# Patient Record
Sex: Female | Born: 1940 | Race: Asian | Hispanic: No | Smoking: Never smoker
Health system: Southern US, Community
[De-identification: ages and names within clinical notes are randomized; demographics above are authoritative.]

## PROBLEM LIST (undated history)

## (undated) DIAGNOSIS — E781 Pure hyperglyceridemia: Secondary | ICD-10-CM

## (undated) DIAGNOSIS — R609 Edema, unspecified: Secondary | ICD-10-CM

## (undated) DIAGNOSIS — R519 Headache, unspecified: Secondary | ICD-10-CM

## (undated) DIAGNOSIS — K8020 Calculus of gallbladder without cholecystitis without obstruction: Secondary | ICD-10-CM

## (undated) DIAGNOSIS — E118 Type 2 diabetes mellitus with unspecified complications: Secondary | ICD-10-CM

## (undated) DIAGNOSIS — I10 Essential (primary) hypertension: Secondary | ICD-10-CM

## (undated) DIAGNOSIS — Z923 Personal history of irradiation: Secondary | ICD-10-CM

## (undated) DIAGNOSIS — M1990 Unspecified osteoarthritis, unspecified site: Secondary | ICD-10-CM

## (undated) DIAGNOSIS — M545 Low back pain, unspecified: Secondary | ICD-10-CM

## (undated) DIAGNOSIS — C3491 Malignant neoplasm of unspecified part of right bronchus or lung: Secondary | ICD-10-CM

## (undated) DIAGNOSIS — I4891 Unspecified atrial fibrillation: Secondary | ICD-10-CM

## (undated) DIAGNOSIS — J40 Bronchitis, not specified as acute or chronic: Secondary | ICD-10-CM

## (undated) DIAGNOSIS — R51 Headache: Secondary | ICD-10-CM

## (undated) DIAGNOSIS — G893 Neoplasm related pain (acute) (chronic): Secondary | ICD-10-CM

## (undated) DIAGNOSIS — C801 Malignant (primary) neoplasm, unspecified: Secondary | ICD-10-CM

## (undated) DIAGNOSIS — R918 Other nonspecific abnormal finding of lung field: Secondary | ICD-10-CM

## (undated) DIAGNOSIS — I4892 Unspecified atrial flutter: Secondary | ICD-10-CM

## (undated) DIAGNOSIS — I2510 Atherosclerotic heart disease of native coronary artery without angina pectoris: Secondary | ICD-10-CM

## (undated) HISTORY — DX: Atherosclerotic heart disease of native coronary artery without angina pectoris: I25.10

## (undated) HISTORY — DX: Edema, unspecified: R60.9

## (undated) HISTORY — DX: Unspecified atrial flutter: I48.92

## (undated) HISTORY — DX: Malignant (primary) neoplasm, unspecified: C80.1

## (undated) HISTORY — DX: Unspecified osteoarthritis, unspecified site: M19.90

## (undated) HISTORY — DX: Low back pain: M54.5

## (undated) HISTORY — DX: Pure hyperglyceridemia: E78.1

## (undated) HISTORY — DX: Malignant neoplasm of unspecified part of right bronchus or lung: C34.91

## (undated) HISTORY — PX: INCONTINENCE SURGERY: SHX676

## (undated) HISTORY — PX: TUBAL LIGATION: SHX77

## (undated) HISTORY — DX: Essential (primary) hypertension: I10

## (undated) HISTORY — DX: Neoplasm related pain (acute) (chronic): G89.3

## (undated) HISTORY — PX: KNEE ARTHROSCOPY: SUR90

## (undated) HISTORY — DX: Type 2 diabetes mellitus with unspecified complications: E11.8

## (undated) HISTORY — DX: Low back pain, unspecified: M54.50

## (undated) HISTORY — PX: APPENDECTOMY: SHX54

## (undated) NOTE — *Deleted (*Deleted)
Physician Discharge Summary  Patient ID: Nicole [name] MRN: [id number] DOB/AGE: 05-Jun-1940 26 y.o.  Admit date: 12/01/2019 Discharge date: 12/07/2019   DISCHARGE PLAN BY DIAGNOSIS    Ataumatic subarachnoid hemorrhage- -Likely in setting of anticoagulation, s/p kcentra -CT angiogram of head showed no aneurysm Apnea Symptomatic bradycardia Afib;  w RVR  Acute DVT of LLE Intermittent fluid and electrolyte imbalance DMT2 with uncontrolled hyperglycemia Mild Leukocytosis Sacral pressure ulcer -Seen by [facility] 11/9->measured 4cmx7cm w/ small 0.2 cm round non-intact area near rectum.  L mediastinal supraclavicular lymphadenopathy  R apical nodule vs consolidation, 7mm R thyroid nodule  Hx adenocarcinoma of lung      DISCHARGE SUMMARY  Nicole [name] is a 4 y.o. female with medical history significant of recently diagnosed DVT on Eliquis, chronic lumbar vertebral compression fracture status post recent kyphoplasty, chronic A. fib, hypertension, remote lung cancer, IDDM, HLD, presented with altered mental status.  Patient remained confused at bedside, most history per the patient's son at bedside.  Patient was diagnosed with DVT about 2 weeks ago, and son reported that patient has not been compliant with Eliquis supposedly for her A. fib, and about 2 weeks ago was found to have acute DVT.  Eliquis was restarted at nursing home and on Friday patient went for lumbar vertebral kyphoplasty.  After surgery, patient has had  difficulty with pain management and devloped nausea and vomiting. which is tramadol alternate with Vicodin. 11/3 patient was found to have a fever 102, and hypoxia on 4 L oxygen, nursing home somewhat suspect patient has UTI and send urine sample.  When EMS arrived it was found patient was in rapid A. fib with heart rate 176, patient was given 10 mg Cardizem IV push and 500 normal saline.  On 11/4 she developed acute neuro change and a STAT head CT was obtained that revealed  large SAH, she received Kcentra.  Neurosurgery was consulted and given significant other comorbidities supportive care and IV hydration was recommended.  Neurosurgery continue to follow patient during admission with plans to perform catheter angiogram however patient became more unstable with hypoxia and bradycardia this prompted goals of care discussion.          Goals of care discussion was held with family with [facility] Resides 11/12 family was updated regarding treatment options.  Neurosurgery was consulted and there was no viable surgical interventions to assist in care.  It was determined at that time that team's main focus would be comfort care.  Patient was transition to DO NOT RESUSCITATE evening of 11/12 and transferred to palliative care for.  On a.m. assessment she appears comfortable with snoring respirations and family at bedside.    Decision was made to pursue residential hospice.  Patient is stable for discharge today.  SIGNIFICANT DIAGNOSTIC STUDIES 11/3 Vascular LE doppler >  negative DVT right, left acute deep vein thrombosis involving left common femoral artery, SF junction, left femoral vein, left proximal profunda vein, left popliteal vein, left posterior tibial veins, and left  peroneal veins.   11/3 CT chest  >> Constellation of findings likely reflect pulmonary edema versus atypical infection. New small to moderate LEFT pleural effusion.  New LEFT and mediastinal supraclavicular lymphadenopathy, nonspecific and possibly reactive in etiology.  11/4 CTH >> Large amount of subarachnoid hemorrhage is noted throughout the brain, most prominently seen in the basal cisterns and left anterior hemispheric fissure and left Sylvian fissure. Blood is also noted in the posterior horns of both lateral ventricles. Hemorrhage is also  noted in the fourth ventricle.   11/4 CTA head/ neck >> No aneurysm identified. Remains highly suspicious for ruptured aneurysm and catheter angiography is  recommended.  11/11 CT brain  >> Decreased and dependently accumulating subarachnoid hemorrhage. Intraventricular reflux with essentially stable ventriculomegaly. No evidence of infarct.  MICRO DATA  11/3 SARS2 / flu  >> neg 11/3 UC >> multiple species 11/3 BCx2 >> 2/4 methicillin resistant stap epi  ANTIBIOTICS 11/3 cefepime >> 11/5 11/3 azithromycin >> s/p 5 days 11/5 ceftriaxone s/p 5 days  CONSULTS Neurosurgery   TUBES / LINES None  Discharge Exam: General: Chronically ill appearing elderly female lying in bed sleeping with snoring respirations HEENT: Vina/AT, MM pink/moist, PERRL,  Neuro: Unresponsive with snoring respirations CV: s1s2 regular rate and rhythm, no murmur, rubs, or gallops,  PULM: Deferred as family request she will be allowed to rest GI: soft, bowel sounds active in all 4 quadrants, non-tender, non-distended Extremities: warm/dry, no visible edema  Skin: no rashes or lesions:   Vitals:   12/06/19 2114 12/06/19 2236 12/07/19 0639 12/07/19 1012  BP: (!) 118/93 111/76 110/74 127/82  Pulse: (!) 110 77 75 (!) 117  Resp: 20 16 16 16   Temp:  (!) 97.3 F (36.3 C) (!) 97.4 F (36.3 C)   TempSrc:  Oral Axillary   SpO2: 98% 97% 97% 96%  Weight:        Discharge Labs  BMET Recent Labs  Lab 11/30/19 1652 12/01/19 0357 12/01/19 0357 12/03/19 0930 12/03/19 0930 12/04/19 0122 12/04/19 0122 12/05/19 0236 12/06/19 0809  NA  --  138  --  140  --  140  --  141 142  K  --  4.5   < > 4.4   < > 4.2   < > 4.6 3.8  CL  --  106  --  107  --  108  --  109  --   CO2  --  24  --  23  --  23  --  21*  --   GLUCOSE  --  267*  --  200*  --  185*  --  151*  --   BUN  --  22  --  29*  --  31*  --  35*  --   CREATININE  --  0.68  --  0.59  --  0.58  --  0.55  --   CALCIUM  --  9.1  --  8.8*  --  8.8*  --  8.7*  --   MG 2.3 2.3  --  2.0  --  2.1  --   --   --   PHOS 2.3* 2.6  --   --   --   --   --   --   --    < > = values in this interval not displayed.    CBC  Recent Labs  Lab 12/01/19 0357 12/01/19 0357 12/04/19 0122 12/05/19 0236 12/06/19 0809  HGB 11.2*   < > 12.1 11.8* 14.3  HCT 34.7*   < > 38.0 37.8 42.0  WBC 12.0*  --  13.2* 12.7*  --   PLT 358  --  449* 423*  --    < > = values in this interval not displayed.    Anti-Coagulation No results for input(s): INR in the last 168 hours.   Allergies as of 12/07/2019   No Known Allergies   Med Rec must be completed prior to using this [facility]***  Disposition:   Residential Hospice  Discharged Condition:   TEKA CHANDA has met maximum benefit of inpatient care and is medically stable and cleared for discharge.  Patient is pending follow up as above.      Time spent on disposition:    *** Minutes.   Signed: Delfin Gant, NP-C [facility] Contact / Pager information can be found on Amion  12/07/2019, 11:05 AM

## (undated) NOTE — *Deleted (*Deleted)
Initial Nutrition Assessment  DOCUMENTATION CODES:      INTERVENTION:  ***   NUTRITION DIAGNOSIS:     related to   as evidenced by  .  GOAL:      MONITOR:      REASON FOR ASSESSMENT:   Low Braden    ASSESSMENT:   Pt with PMH of recently dx DVT, chronic compression fxs s/p recent kyphoplasty, Afib, HTN, remote lung cancer, IDDM, HLD now admitted from rehab with AMS and sepsis from PNA vs UTI.   Medications reviewed and include: SSI Labs reviewed: K+ 3.3, hgbA1C: 6.4    NUTRITION - FOCUSED PHYSICAL EXAM:  {RD Focused Exam List:21252}  Diet Order:   Diet Order            Diet NPO time specified  Diet effective now                 EDUCATION NEEDS:      Skin:  Skin Assessment: Skin Integrity Issues: Skin Integrity Issues:: Stage I, Incisions Stage I: R buttocks Incisions: back  Last BM:  unknown  Height:   Ht Readings from Last 1 Encounters:  11/22/19 5\' 6"  (1.676 m)    Weight:   Wt Readings from Last 1 Encounters:  12/21/2019 76.1 kg    Ideal Body Weight:  59 kg  BMI:  Body mass index is 27.08 kg/m.  Estimated Nutritional Needs:   Kcal:     Protein:     Fluid:     Nicole [name] P., RD, LDN, CNSC See AMiON for contact information

## (undated) NOTE — *Deleted (*Deleted)
Stroke Response Nurse Documentation Code Documentation  Nicole [name] is a 42 y.o. female who was admitted to [facility] on 2019-12-20 for AMS. Patient with continued AMS today and so CT head ordered which showed SAH, code stroke activated. Per patient's nurse the patient's AMS has been waxing and waning, patient became non-verbal around 1200. Patient from *** where she was LKW at *** and now complaining of *** . On {meds; anticoagulants:31417}. Stroke team at the bedside on patient arrival. Labs drawn and patient cleared for CT by Dr. [name] Patient to CT with team. NIHSS ***, see documentation for details and code stroke times. Patient with {NWGNF:621308} on exam. The following imaging was completed:  *** (CT, CTA head and neck, CTP). Patient {ACTION; IS/IS MVH:[id number]} a candidate for tPA due to *** Care/Plan. Bedside handoff with ED RN ***.    Sinda Du Mead  Stroke Response RN

---

## 2000-03-24 ENCOUNTER — Other Ambulatory Visit: Admission: RE | Admit: 2000-03-24 | Discharge: 2000-03-24 | Payer: Self-pay | Admitting: Family Medicine

## 2001-05-18 ENCOUNTER — Other Ambulatory Visit: Admission: RE | Admit: 2001-05-18 | Discharge: 2001-05-18 | Payer: Self-pay | Admitting: Family Medicine

## 2002-01-24 DIAGNOSIS — C801 Malignant (primary) neoplasm, unspecified: Secondary | ICD-10-CM

## 2002-01-24 DIAGNOSIS — C541 Malignant neoplasm of endometrium: Secondary | ICD-10-CM | POA: Insufficient documentation

## 2002-01-24 HISTORY — PX: ABDOMINAL HYSTERECTOMY: SHX81

## 2002-01-24 HISTORY — DX: Malignant (primary) neoplasm, unspecified: C80.1

## 2002-07-02 ENCOUNTER — Other Ambulatory Visit: Admission: RE | Admit: 2002-07-02 | Discharge: 2002-07-02 | Payer: Self-pay | Admitting: Family Medicine

## 2002-07-05 ENCOUNTER — Encounter: Admission: RE | Admit: 2002-07-05 | Discharge: 2002-07-05 | Payer: Self-pay | Admitting: Family Medicine

## 2002-07-05 ENCOUNTER — Encounter: Payer: Self-pay | Admitting: Family Medicine

## 2002-12-05 ENCOUNTER — Ambulatory Visit (HOSPITAL_COMMUNITY): Admission: RE | Admit: 2002-12-05 | Discharge: 2002-12-05 | Payer: Self-pay | Admitting: Obstetrics & Gynecology

## 2002-12-18 ENCOUNTER — Ambulatory Visit: Admission: RE | Admit: 2002-12-18 | Discharge: 2002-12-18 | Payer: Self-pay | Admitting: Gynecology

## 2002-12-24 ENCOUNTER — Encounter (INDEPENDENT_AMBULATORY_CARE_PROVIDER_SITE_OTHER): Payer: Self-pay

## 2002-12-24 ENCOUNTER — Inpatient Hospital Stay (HOSPITAL_COMMUNITY): Admission: RE | Admit: 2002-12-24 | Discharge: 2002-12-26 | Payer: Self-pay | Admitting: Obstetrics & Gynecology

## 2003-05-19 ENCOUNTER — Encounter: Payer: Self-pay | Admitting: Gastroenterology

## 2003-09-15 ENCOUNTER — Ambulatory Visit (HOSPITAL_COMMUNITY): Admission: RE | Admit: 2003-09-15 | Discharge: 2003-09-15 | Payer: Self-pay | Admitting: Family Medicine

## 2005-02-01 ENCOUNTER — Emergency Department (HOSPITAL_COMMUNITY): Admission: EM | Admit: 2005-02-01 | Discharge: 2005-02-01 | Payer: Self-pay | Admitting: Family Medicine

## 2005-03-24 ENCOUNTER — Encounter: Admission: RE | Admit: 2005-03-24 | Discharge: 2005-03-24 | Payer: Self-pay | Admitting: Family Medicine

## 2005-09-28 ENCOUNTER — Encounter: Admission: RE | Admit: 2005-09-28 | Discharge: 2005-09-28 | Payer: Self-pay | Admitting: Family Medicine

## 2007-03-14 ENCOUNTER — Encounter: Admission: RE | Admit: 2007-03-14 | Discharge: 2007-03-14 | Payer: Self-pay | Admitting: Obstetrics & Gynecology

## 2007-08-01 LAB — CONVERTED CEMR LAB: Pap Smear: NORMAL

## 2008-03-12 LAB — HM MAMMOGRAPHY: HM Mammogram: NORMAL

## 2008-08-25 ENCOUNTER — Encounter: Admission: RE | Admit: 2008-08-25 | Discharge: 2008-08-25 | Payer: Self-pay | Admitting: Obstetrics & Gynecology

## 2009-01-20 ENCOUNTER — Ambulatory Visit: Payer: Self-pay | Admitting: Internal Medicine

## 2009-01-20 DIAGNOSIS — M1990 Unspecified osteoarthritis, unspecified site: Secondary | ICD-10-CM | POA: Insufficient documentation

## 2009-01-20 DIAGNOSIS — M545 Low back pain, unspecified: Secondary | ICD-10-CM | POA: Insufficient documentation

## 2009-01-20 DIAGNOSIS — I10 Essential (primary) hypertension: Secondary | ICD-10-CM

## 2009-01-20 DIAGNOSIS — Z8541 Personal history of malignant neoplasm of cervix uteri: Secondary | ICD-10-CM | POA: Insufficient documentation

## 2009-01-20 DIAGNOSIS — E118 Type 2 diabetes mellitus with unspecified complications: Secondary | ICD-10-CM | POA: Insufficient documentation

## 2009-01-20 HISTORY — DX: Essential (primary) hypertension: I10

## 2009-01-20 HISTORY — DX: Type 2 diabetes mellitus with unspecified complications: E11.8

## 2009-06-30 ENCOUNTER — Telehealth: Payer: Self-pay | Admitting: Gastroenterology

## 2009-07-01 ENCOUNTER — Encounter (INDEPENDENT_AMBULATORY_CARE_PROVIDER_SITE_OTHER): Payer: Self-pay | Admitting: *Deleted

## 2009-07-02 ENCOUNTER — Ambulatory Visit: Payer: Self-pay | Admitting: Internal Medicine

## 2009-07-02 LAB — CONVERTED CEMR LAB
ALT: 22 units/L (ref 0–35)
AST: 19 units/L (ref 0–37)
Albumin: 3.6 g/dL (ref 3.5–5.2)
Alkaline Phosphatase: 42 units/L (ref 39–117)
BUN: 16 mg/dL (ref 6–23)
Basophils Absolute: 0 10*3/uL (ref 0.0–0.1)
Basophils Relative: 0.4 % (ref 0.0–3.0)
Bilirubin Urine: NEGATIVE
Bilirubin, Direct: 0.1 mg/dL (ref 0.0–0.3)
CO2: 27 meq/L (ref 19–32)
Calcium: 9.3 mg/dL (ref 8.4–10.5)
Chloride: 102 meq/L (ref 96–112)
Cholesterol: 163 mg/dL (ref 0–200)
Creatinine, Ser: 0.8 mg/dL (ref 0.4–1.2)
Direct LDL: 77.2 mg/dL
Eosinophils Absolute: 0.1 10*3/uL (ref 0.0–0.7)
Eosinophils Relative: 1.6 % (ref 0.0–5.0)
GFR calc non Af Amer: 76.13 mL/min (ref >60)
Glucose, Bld: 139 mg/dL — ABNORMAL HIGH (ref 70–99)
HCT: 41.5 % (ref 36.0–46.0)
HDL: 37.4 mg/dL — ABNORMAL LOW (ref >39.00)
Hemoglobin, Urine: NEGATIVE
Hemoglobin: 14.4 g/dL (ref 12.0–15.0)
Hgb A1c MFr Bld: 7.4 % — ABNORMAL HIGH (ref 4.6–6.5)
Ketones, ur: NEGATIVE mg/dL
Lymphocytes Relative: 26.7 % (ref 12.0–46.0)
Lymphs Abs: 1.2 10*3/uL (ref 0.7–4.0)
MCHC: 34.6 g/dL (ref 30.0–36.0)
MCV: 88.8 fL (ref 78.0–100.0)
Monocytes Absolute: 0.3 10*3/uL (ref 0.1–1.0)
Monocytes Relative: 6.6 % (ref 3.0–12.0)
Neutro Abs: 2.8 10*3/uL (ref 1.4–7.7)
Neutrophils Relative %: 64.7 % (ref 43.0–77.0)
Nitrite: NEGATIVE
Platelets: 206 10*3/uL (ref 150.0–400.0)
Potassium: 4 meq/L (ref 3.5–5.1)
RBC: 4.68 M/uL (ref 3.87–5.11)
RDW: 12.9 % (ref 11.5–14.6)
Sodium: 137 meq/L (ref 135–145)
Specific Gravity, Urine: 1.02 (ref 1.000–1.030)
TSH: 1.15 microintl units/mL (ref 0.35–5.50)
Total Bilirubin: 1.1 mg/dL (ref 0.3–1.2)
Total CHOL/HDL Ratio: 4
Total Protein, Urine: NEGATIVE mg/dL
Total Protein: 6.6 g/dL (ref 6.0–8.3)
Triglycerides: 246 mg/dL — ABNORMAL HIGH (ref 0.0–149.0)
Urine Glucose: NEGATIVE mg/dL
Urobilinogen, UA: 0.2 (ref 0.0–1.0)
VLDL: 49.2 mg/dL — ABNORMAL HIGH (ref 0.0–40.0)
WBC: 4.3 10*3/uL — ABNORMAL LOW (ref 4.5–10.5)
pH: 6 (ref 5.0–8.0)

## 2009-07-15 ENCOUNTER — Ambulatory Visit: Payer: Self-pay | Admitting: Internal Medicine

## 2009-07-15 DIAGNOSIS — E781 Pure hyperglyceridemia: Secondary | ICD-10-CM | POA: Insufficient documentation

## 2009-07-21 ENCOUNTER — Encounter (INDEPENDENT_AMBULATORY_CARE_PROVIDER_SITE_OTHER): Payer: Self-pay | Admitting: *Deleted

## 2009-07-22 ENCOUNTER — Ambulatory Visit: Payer: Self-pay | Admitting: Gastroenterology

## 2009-08-05 ENCOUNTER — Ambulatory Visit: Payer: Self-pay | Admitting: Gastroenterology

## 2009-08-05 HISTORY — PX: COLONOSCOPY: SHX174

## 2009-08-05 HISTORY — PX: POLYPECTOMY: SHX149

## 2009-08-05 LAB — HM COLONOSCOPY

## 2009-08-07 ENCOUNTER — Encounter: Payer: Self-pay | Admitting: Gastroenterology

## 2009-08-13 ENCOUNTER — Telehealth: Payer: Self-pay | Admitting: Internal Medicine

## 2009-08-31 ENCOUNTER — Telehealth: Payer: Self-pay | Admitting: Internal Medicine

## 2009-09-30 ENCOUNTER — Encounter: Payer: Self-pay | Admitting: Internal Medicine

## 2009-09-30 ENCOUNTER — Ambulatory Visit: Payer: Self-pay | Admitting: Internal Medicine

## 2009-09-30 DIAGNOSIS — R0602 Shortness of breath: Secondary | ICD-10-CM | POA: Insufficient documentation

## 2009-09-30 LAB — CONVERTED CEMR LAB
BUN: 20 mg/dL (ref 6–23)
CK-MB: 2.3 ng/mL (ref 0.3–4.0)
CO2: 29 meq/L (ref 19–32)
Calcium: 9.4 mg/dL (ref 8.4–10.5)
Chloride: 102 meq/L (ref 96–112)
Cholesterol, target level: 200 mg/dL
Cholesterol: 169 mg/dL (ref 0–200)
Creatinine, Ser: 0.8 mg/dL (ref 0.4–1.2)
Direct LDL: 75.9 mg/dL
GFR calc non Af Amer: 73.93 mL/min (ref >60)
Glucose, Bld: 165 mg/dL — ABNORMAL HIGH (ref 70–99)
HDL goal, serum: 40 mg/dL
HDL: 33.5 mg/dL — ABNORMAL LOW (ref >39.00)
Hgb A1c MFr Bld: 8 % — ABNORMAL HIGH (ref 4.6–6.5)
LDL Goal: 100 mg/dL
Potassium: 4.1 meq/L (ref 3.5–5.1)
Pro B Natriuretic peptide (BNP): 7.3 pg/mL (ref 0.0–100.0)
Relative Index: 2.1 (ref 0.0–2.5)
Sodium: 139 meq/L (ref 135–145)
Total CHOL/HDL Ratio: 5
Total CK: 111 units/L (ref 7–177)
Triglycerides: 277 mg/dL — ABNORMAL HIGH (ref 0.0–149.0)
VLDL: 55.4 mg/dL — ABNORMAL HIGH (ref 0.0–40.0)

## 2009-12-16 ENCOUNTER — Ambulatory Visit: Payer: Self-pay | Admitting: Internal Medicine

## 2009-12-16 DIAGNOSIS — N390 Urinary tract infection, site not specified: Secondary | ICD-10-CM | POA: Insufficient documentation

## 2009-12-16 LAB — CONVERTED CEMR LAB
Bilirubin Urine: NEGATIVE
Glucose, Urine, Semiquant: NEGATIVE
Ketones, urine, test strip: NEGATIVE
Nitrite: NEGATIVE
Specific Gravity, Urine: 1.015
Urobilinogen, UA: 0.2
pH: 6.5

## 2010-01-11 ENCOUNTER — Telehealth (INDEPENDENT_AMBULATORY_CARE_PROVIDER_SITE_OTHER): Payer: Self-pay | Admitting: *Deleted

## 2010-01-12 ENCOUNTER — Ambulatory Visit (HOSPITAL_COMMUNITY)
Admission: RE | Admit: 2010-01-12 | Discharge: 2010-01-13 | Payer: Self-pay | Source: Home / Self Care | Attending: Obstetrics and Gynecology | Admitting: Obstetrics and Gynecology

## 2010-02-13 ENCOUNTER — Encounter: Payer: Self-pay | Admitting: Obstetrics & Gynecology

## 2010-02-23 NOTE — Letter (Signed)
Summary: Lipid Letter  [facility]  [address] [place], [place] [address]   Phone: [phone]  Fax: [phone]    07/02/2009  Nicole [name] [address] [place], [place]  [address]  Dear Nicole [name]:  We have carefully reviewed your last lipid profile from  and the results are noted below with a summary of recommendations for lipid management.    Cholesterol:       163     Goal: <200   HDL "good" Cholesterol:   57.84     Goal: >40   LDL "bad" Cholesterol:   77     Goal: <100   Triglycerides:       246.0     Goal: <150        TLC Diet (Therapeutic Lifestyle Change): Saturated Fats & Transfatty acids should be kept < 7% of total calories ***Reduce Saturated Fats Polyunstaurated Fat can be up to 10% of total calories Monounsaturated Fat Fat can be up to 20% of total calories Total Fat should be no greater than 25-35% of total calories Carbohydrates should be 50-60% of total calories Protein should be approximately 15% of total calories Fiber should be at least 20-30 grams a day ***Increased fiber may help lower LDL Total Cholesterol should be < 200mg /day Consider adding plant stanol/sterols to diet (example: Benacol spread) ***A higher intake of unsaturated fat may reduce Triglycerides and Increase HDL    Adjunctive Measures (may lower LIPIDS and reduce risk of Heart Attack) include: Aerobic Exercise (20-30 minutes 3-4 times a week) Limit Alcohol Consumption Weight Reduction Aspirin 75-81 mg a day by mouth (if not allergic or contraindicated) Dietary Fiber 20-30 grams a day by mouth     Current Medications: 1)    Benicar Hct 40-12.5 Mg Tabs (Olmesartan medoxomil-hctz) .... Take 1 tablet by mouth once a day 2)    Metoprolol Succinate 50 Mg Xr24h-tab (Metoprolol succinate) .... Take 1 tablet by mouth once a day 3)    Co Q 10 Caps  .... Take 1 tablet by mouth once a day 4)    Dhea 25 Mg Tabs (Prasterone (dhea)) 5)    Acai Berry 500 Mg Caps (Acai) 6)     Glucosamine  .... Take 1 tablet by mouth three times a day  If you have any questions, please call. We appreciate being able to work with you.   Sincerely,    [facility] Etta Grandchild MD

## 2010-02-23 NOTE — Assessment & Plan Note (Signed)
Summary: freq/urgency/--?uti/cd   Vital Signs:  Patient profile:   70 year old female Menstrual status:  postmenopausal Height:      66 inches Weight:      205 pounds BMI:     33.21 O2 Sat:      98 % on Room air Temp:     98.8 degrees F oral Pulse rate:   85 / minute Pulse rhythm:   regular Resp:     16 per minute BP sitting:   126 / 70  (left arm) Cuff size:   large  Vitals Entered By: Rock Nephew CMA (December 16, 2009 11:10 AM)  O2 Flow:  Room air CC: pt c/o urinary freq, Dysuria, Hypertension Management Is Patient Diabetic? Yes Did you bring your meter with you today? No Pain Assessment Patient in pain? no       Does patient need assistance? Functional Status Self care Ambulation Normal     Menstrual Status postmenopausal Last PAP Result Normal   Primary Care Provider:  Etta Grandchild MD  CC:  pt c/o urinary freq, Dysuria, and Hypertension Management.  History of Present Illness:  Dysuria      This is a 70 year old woman who presents with Dysuria.  The symptoms began 3 days ago.  The intensity is described as moderate.  The patient reports burning with urination, urinary frequency, urgency, and hematuria, but denies vaginal discharge, vaginal itching, and vaginal sores.  The patient denies the following associated symptoms: nausea, vomiting, fever, shaking chills, flank pain, abdominal pain, back pain, pelvic pain, and arthralgias.  Risk factors for urinary tract infection include diabetes.  The patient denies the following risk factors: history of GU anomaly.  History is significant for recent instrumentation - she had a urinary catheter done by Dr. Edward Jolly 5 days ago for a vaginal u/s.  Hypertension History:      She denies headache, chest pain, palpitations, dyspnea with exertion, orthopnea, PND, peripheral edema, visual symptoms, neurologic problems, syncope, and side effects from treatment.  She notes no problems with any antihypertensive medication side  effects.        Positive major cardiovascular risk factors include female age 68 years old or older, diabetes, hyperlipidemia, and hypertension.  Negative major cardiovascular risk factors include negative family history for ischemic heart disease and non-tobacco-user status.        Further assessment for target organ damage reveals no history of ASHD, cardiac end-organ damage (CHF/LVH), stroke/TIA, peripheral vascular disease, renal insufficiency, or hypertensive retinopathy.     Allergies: No Known Drug Allergies  Past History:  Past Medical History: Last updated: 01/20/2009 Uterine/Cervical cancer, hx of Hypertension Low back pain Osteoarthritis Diabetes mellitus, type II  Past Surgical History: Last updated: 01/20/2009 Appendectomy Hysterectomy  Family History: Last updated: 01/20/2009 Family History of Arthritis Family History of Colon CA 1st degree relative <60 Family History Diabetes 1st degree relative Family History High cholesterol Family History Hypertension  Social History: Last updated: 01/20/2009 Retired Married Never Smoked Alcohol use-no Drug use-no Regular exercise-yes  Risk Factors: Alcohol Use: 0 (07/15/2009) Exercise: yes (01/20/2009)  Risk Factors: Smoking Status: never (07/15/2009)  Family History: Reviewed history from 01/20/2009 and no changes required. Family History of Arthritis Family History of Colon CA 1st degree relative <60 Family History Diabetes 1st degree relative Family History High cholesterol Family History Hypertension  Social History: Reviewed history from 01/20/2009 and no changes required. Retired Married Never Smoked Alcohol use-no Drug use-no Regular exercise-yes  Review of Systems  The patient denies anorexia, fever, weight loss, weight gain, chest pain, peripheral edema, prolonged cough, headaches, hemoptysis, abdominal pain, incontinence, suspicious skin lesions, and enlarged lymph nodes.   Endo:  Denies  cold intolerance, excessive hunger, excessive thirst, excessive urination, heat intolerance, polyuria, and weight change.  Physical Exam  General:  alert, well-developed, well-nourished, well-hydrated, appropriate dress, normal appearance, cooperative to examination, good hygiene, and overweight-appearing.   Mouth:  Oral mucosa and oropharynx without lesions or exudates.  Teeth in good repair. Neck:  supple, full ROM, no masses, no thyromegaly, no thyroid nodules or tenderness, no JVD, normal carotid upstroke, no carotid bruits, and no cervical lymphadenopathy.   Lungs:  Normal respiratory effort, chest expands symmetrically. Lungs are clear to auscultation, no crackles or wheezes. Heart:  Normal rate and regular rhythm. S1 and S2 normal without gallop, murmur, click, rub or other extra sounds. Abdomen:  soft, non-tender, normal bowel sounds, no distention, no masses, no guarding, no rigidity, no rebound tenderness, no hepatomegaly, and no splenomegaly.  abdominal scar(s).  no costrovertebral angle tenderness. Msk:  No deformity or scoliosis noted of thoracic or lumbar spine.   Extremities:  No clubbing, cyanosis, edema, or deformity noted with normal full range of motion of all joints.   Neurologic:  No cranial nerve deficits noted. Station and gait are normal. Plantar reflexes are down-going bilaterally. DTRs are symmetrical throughout. Sensory, motor and coordinative functions appear intact. Skin:  turgor normal, color normal, no rashes, no suspicious lesions, and no ecchymoses.   Psych:  Cognition and judgment appear intact. Alert and cooperative with normal attention span and concentration. No apparent delusions, illusions, hallucinations   Impression & Recommendations:  Problem # 1:  UTI (ICD-599.0) Assessment New  Her updated medication list for this problem includes:    Ciprofloxacin Hcl 500 Mg Tabs (Ciprofloxacin hcl) ..... One by mouth two times a day for 7 days  Orders: UA  Dipstick w/o Micro (manual) (27253) T-Culture, Urine ([id number])  Encouraged to push clear liquids, get enough rest, and take acetaminophen as needed. To be seen in 10 days if no improvement, sooner if worse.  Problem # 2:  DIABETES MELLITUS, TYPE II (ICD-250.00) Assessment: Unchanged  Her updated medication list for this problem includes:    Losartan Potassium-hctz 100-25 Mg Tabs (Losartan potassium-hctz) ..... One by mouth once daily for high blood pressure    Januvia 100 Mg Tabs (Sitagliptin phosphate) ..... One by mouth once daily for daibetes  Problem # 3:  HYPERTENSION (ICD-401.9) Assessment: Improved  Her updated medication list for this problem includes:    Metoprolol Succinate 50 Mg Xr24h-tab (Metoprolol succinate) .[name]... Take 1 tablet by mouth once a day    Losartan Potassium-hctz 100-25 Mg Tabs (Losartan potassium-hctz) ..... One by mouth once daily for high blood pressure  BP today: 126/70 Prior BP: 130/70 (09/30/2009)  Prior 10 Yr Risk Heart Disease: Not enough information (07/02/2009)  Labs Reviewed: K+: 4.1 (09/30/2009) Creat: : 0.8 (09/30/2009)   Chol: 169 (09/30/2009)   HDL: 33.50 (09/30/2009)   TG: 277.0 (09/30/2009)  Complete Medication List: 1)  Metoprolol Succinate 50 Mg Xr24h-tab (Metoprolol succinate) .... Take 1 tablet by mouth once a day 2)  Co Q 10 Caps  .... Take 1 tablet by mouth once a day 3)  Dhea 25 Mg Tabs (Prasterone (dhea)) .... Take 1 tablet by mouth two times a day. 4)  Acai Berry 500 Mg Caps (Acai) .... Take 1 capsule by mouth once a day. 5)  Glucosamine  .... Take  1 tablet by mouth three times a day 6)  Lovaza 1 Gm Caps (Omega-3-acid ethyl esters) .... 2 by mouth two times a day 7)  Losartan Potassium-hctz 100-25 Mg Tabs (Losartan potassium-hctz) .... One by mouth once daily for high blood pressure 8)  Freestyle Test Strp (Glucose blood) .[name].. 1 strip once a day 9)  Januvia 100 Mg Tabs (Sitagliptin phosphate) .... One by mouth once daily for  daibetes 10)  Ciprofloxacin Hcl 500 Mg Tabs (Ciprofloxacin hcl) .... One by mouth two times a day for 7 days  Hypertension Assessment/Plan:      The patient's hypertensive risk group is category C: Target organ damage and/or diabetes.  Today's blood pressure is 126/70.  Her blood pressure goal is < 130/80.  Patient Instructions: 1)  Please schedule a follow-up appointment in 2 weeks. 2)  Take your antibiotic as prescribed until ALL of it is gone, but stop if you develop a rash or swelling and contact our office as soon as possible. Prescriptions: CIPROFLOXACIN HCL 500 MG TABS (CIPROFLOXACIN HCL) One by mouth two times a day for 7 days  #14 x 1   Entered and Authorized by:   Etta Grandchild MD   Signed by:   Etta Grandchild MD on 12/16/2009   Method used:   Electronically to        [facility]. [phone]* (retail)       [address]       [place], [place]  [address]       Ph: [phone]       Fax: [phone]   RxID:   [id number]    Orders Added: 1)  UA Dipstick w/o Micro (manual) [81002] 2)  T-Culture, Urine [[id number]] 3)  Est. Patient Level IV [28315]    Laboratory Results   Urine Tests  Date/Time Received: Rock Nephew CMA  December 16, 2009 11:11 AM  Date/Time Reported: Rock Nephew CMA  December 16, 2009 11:11 AM   Routine Urinalysis   Color: red Appearance: Cloudy Glucose: negative   (Normal Range: Negative) Bilirubin: negative   (Normal Range: Negative) Ketone: negative   (Normal Range: Negative) Spec. Gravity: 1.015   (Normal Range: 1.003-1.035) Blood: large   (Normal Range: Negative) pH: 6.5   (Normal Range: 5.0-8.0) Protein: trace   (Normal Range: Negative) Urobilinogen: 0.2   (Normal Range: 0-1) Nitrite: negative   (Normal Range: Negative) Leukocyte Esterace: large   (Normal Range: Negative)

## 2010-02-23 NOTE — Procedures (Signed)
Summary: Colon   Colonoscopy  Procedure date:  05/19/2003  Findings:      Location:  [facility].   Patient Name: Nicole [name], Nicole [name] MRN:  Procedure Procedures: Colonoscopy CPT: 11914.  Personnel: Endoscopist: Vania Rea. Jarold Motto, MD.  Exam Location: Exam performed in Outpatient Clinic. Outpatient  Patient Consent: Procedure, Alternatives, Risks and Benefits discussed, consent obtained, from patient. Consent was obtained by the RN.  Indications  Average Risk Screening Routine.  Comments: Hx. of uterine carcinoma and recent surgery.  History  Current Medications: Patient is not currently taking Coumadin.  Pre-Exam Physical: Performed May 19, 2003. Cardio-pulmonary exam, Rectal exam, Abdominal exam, Extremity exam, Mental status exam WNL.  Exam Exam: Extent of exam reached: Cecum, extent intended: Cecum.  The cecum was identified by appendiceal orifice and IC valve. Patient position: on left side. Duration of exam: 20 minutes. Colon retroflexion performed. Images taken. ASA Classification: I. Tolerance: excellent.  Monitoring: Pulse and BP monitoring, Oximetry used. Supplemental O2 given. at 2 Liters.  Colon Prep Used Golytely for colon prep. Prep results: excellent.  Sedation Meds: Patient assessed and found to be appropriate for moderate (conscious) sedation. Fentanyl 75 mcg. given IV. Versed 8 mg. given IV.  Instrument(s): CF 140L. Serial [id number].  Findings - HEMORRHOIDS: Internal and External. Size: Large. Not bleeding. Not thrombosed. ICD9: Hemorrhoids, Internal and  External: 455.6.   Assessment Normal examination.  Diagnoses: 455.6: Hemorrhoids, Internal and  External.   Events  Unplanned Interventions: No intervention was required.  Plans Medication Plan: Continue current medications.  Patient Education: Patient given standard instructions for: Hemorrhoids.  Disposition: After procedure patient sent to recovery. After recovery  patient sent home.  Scheduling/Referral: Follow-Up prn.    cc: Fayrene Fearing B. Kindl, MD   This report was created from the original endoscopy report, which was reviewed and signed by the above listed endoscopist.

## 2010-02-23 NOTE — Letter (Signed)
Summary: Previsit letter  [facility]  [address] [place], [place] [address]   Phone: [phone]  Fax: [phone]       07/01/2009 MRN: [id number]  [facility] [address] [place], [place]  [address]  Dear Nicole [name],  Welcome to the Gastroenterology Division at [facility].    You are scheduled to see a nurse for your pre-procedure visit on 07-22-09 at 10:00a.m. on the 3rd floor at [facility], [address]  We ask that you try to arrive at our office 15 minutes prior to your appointment time to allow for check-in.  Your nurse visit will consist of discussing your medical and surgical history, your immediate family medical history, and your medications.    Please bring a complete list of all your medications or, if you prefer, bring the medication bottles and we will list them.  We will need to be aware of both prescribed and over the counter drugs.  We will need to know exact dosage information as well.  If you are on blood thinners (Coumadin, Plavix, Aggrenox, Ticlid, etc.) please call our office today/prior to your appointment, as we need to consult with your physician about holding your medication.   Please be prepared to read and sign documents such as consent forms, a financial agreement, and acknowledgement forms.  If necessary, and with your consent, a friend or relative is welcome to sit-in on the nurse visit with you.  Please bring your insurance card so that we may make a copy of it.  If your insurance requires a referral to see a specialist, please bring your referral form from your primary care physician.  No co-pay is required for this nurse visit.     If you cannot keep your appointment, please call [phone] to cancel or reschedule prior to your appointment date.  This allows [place] the opportunity to schedule an appointment for another patient in need of care.    Thank you for choosing [facility] for your medical needs.   We appreciate the opportunity to care for you.  Please visit [place] at our website  to learn more about our practice.                     Sincerely.                                                                                                                   The Gastroenterology Division

## 2010-02-23 NOTE — Progress Notes (Signed)
Summary: next COL?  Phone Note Call from Patient Call back at Home Phone [phone]   Caller: Patient Reason for Call: Talk to Nurse Summary of Call: pt had last COL in 2005 and would like to know when she should have her next COL Initial call taken by: Vallarie Mare,  June 30, 2009 11:20 AM  Follow-up for Phone Call        Normal colon 2005.  See report. Follow-up by: Ashok Cordia RN,  July 01, 2009 8:36 AM  Additional Follow-up for Phone Call Additional follow up Details #1::        Lm for pt to call.  Ashok Cordia RN  July 01, 2009 12:18 PM    Additional Follow-up for Phone Call Additional follow up Details #2::    now Follow-up by: Mardella Layman MD Clementeen Graham,  July 01, 2009 12:11 PM  Additional Follow-up for Phone Call Additional follow up Details #3:: Details for Additional Follow-up Action Taken: Appt sch for previsit and colon. Additional Follow-up by: Ashok Cordia RN,  July 02, 2009 9:58 AM

## 2010-02-23 NOTE — Assessment & Plan Note (Signed)
Summary: YEARLY FU/NWS  #   Vital Signs:  Patient profile:   70 year old female Height:      66 inches Weight:      203 pounds O2 Sat:      98 % on Room air Temp:     98.3 degrees F oral Pulse rate:   75 / minute Pulse rhythm:   regular Resp:     16 per minute BP sitting:   134 / 82  (left arm) Cuff size:   large  Vitals Entered By: Rock Nephew CMA (July 02, 2009 9:06 AM)  O2 Flow:  Room air  Primary Care Provider:  Etta Grandchild MD   History of Present Illness:  Follow-Up Visit      This is a 70 year old woman who presents for Follow-up visit.  The patient denies chest pain, palpitations, dizziness, syncope, low blood sugar symptoms, high blood sugar symptoms, edema, SOB, DOE, PND, and orthopnea.  Since the last visit the patient notes no new problems or concerns.  The patient reports taking meds as prescribed, monitoring BP, monitoring blood sugars, and dietary compliance.  When questioned about possible medication side effects, the patient notes none.    Hypertension History:      She denies headache, chest pain, palpitations, dyspnea with exertion, orthopnea, PND, peripheral edema, visual symptoms, neurologic problems, syncope, and side effects from treatment.  She notes no problems with any antihypertensive medication side effects.        Positive major cardiovascular risk factors include female age 107 years old or older, diabetes, and hypertension.  Negative major cardiovascular risk factors include no history of hyperlipidemia, negative family history for ischemic heart disease, and non-tobacco-user status.        Further assessment for target organ damage reveals no history of ASHD, cardiac end-organ damage (CHF/LVH), stroke/TIA, peripheral vascular disease, renal insufficiency, or hypertensive retinopathy.     Preventive Screening-Counseling & Management  Alcohol-Tobacco     Alcohol drinks/day: 0     Smoking Status: never  Hep-HIV-STD-Contraception     Hepatitis  Risk: no risk noted     HIV Risk: no risk noted     STD Risk: no risk noted     SBE monthly: yes     SBE Education/Counseling: to perform regular SBE  Safety-Violence-Falls     Seat Belt Use: yes     Helmet Use: yes     Firearms in the Home: no firearms in the home     Smoke Detectors: yes     Violence in the Home: no risk noted     Sexual Abuse: no      Sexual History:  currently monogamous.        Drug Use:  no.        Blood Transfusions:  no.    Clinical Review Panels:  Prevention   Last Mammogram:  Normal Bilateral (03/12/2008)   Last Pap Smear:  Normal (08/01/2007)   Last Colonoscopy:  Location:  [facility].  (05/19/2003)  Immunizations   Last Tetanus Booster:  Tdap (05/07/2008)   Last Pneumovax:  Pneumovax (05/07/2008)  Diabetes Management   Last Foot Exam:  yes (07/02/2009)   Last Pneumovax:  Pneumovax (05/07/2008)   Current Medications (verified): 1)  Benicar Hct 40-12.5 Mg Tabs (Olmesartan Medoxomil-Hctz) .... Take 1 Tablet By Mouth Once A Day 2)  Metoprolol Succinate 50 Mg Xr24h-Tab (Metoprolol Succinate) .... Take 1 Tablet By Mouth Once A Day 3)  Co  Q 10 Caps .... Take 1 Tablet By Mouth Once A Day 4)  Dhea 25 Mg Tabs (Prasterone (Dhea)) 5)  Acai Berry 500 Mg Caps (Acai) 6)  Glucosamine .... Take 1 Tablet By Mouth Three Times A Day  Allergies (verified): No Known Drug Allergies  Past History:  Past Medical History: Last updated: 01/20/2009 Uterine/Cervical cancer, hx of Hypertension Low back pain Osteoarthritis Diabetes mellitus, type II  Past Surgical History: Last updated: 01/20/2009 Appendectomy Hysterectomy  Family History: Last updated: 01/20/2009 Family History of Arthritis Family History of Colon CA 1st degree relative <60 Family History Diabetes 1st degree relative Family History High cholesterol Family History Hypertension  Social History: Last updated: 01/20/2009 Retired Married Never Smoked Alcohol  use-no Drug use-no Regular exercise-yes  Risk Factors: Alcohol Use: 0 (07/02/2009) Exercise: yes (01/20/2009)  Risk Factors: Smoking Status: never (07/02/2009)  Family History: Reviewed history from 01/20/2009 and no changes required. Family History of Arthritis Family History of Colon CA 1st degree relative <60 Family History Diabetes 1st degree relative Family History High cholesterol Family History Hypertension  Social History: Reviewed history from 01/20/2009 and no changes required. 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  The patient denies anorexia, fever, weight loss, chest pain, syncope, dyspnea on exertion, peripheral edema, prolonged cough, headaches, hemoptysis, abdominal pain, melena, hematochezia, severe indigestion/heartburn, hematuria, suspicious skin lesions, enlarged lymph nodes, angioedema, and breast masses.   Endo:  Denies cold intolerance, excessive hunger, excessive thirst, excessive urination, heat intolerance, polyuria, and weight change.  Physical Exam  General:  alert, well-developed, well-nourished, well-hydrated, appropriate dress, normal appearance, cooperative to examination, good hygiene, and overweight-appearing.   Head:  normocephalic and atraumatic.   Eyes:  vision grossly intact, pupils equal, pupils round, pupils reactive to light, and no injection.   Ears:  R ear normal and L ear normal.   Mouth:  Oral mucosa and oropharynx without lesions or exudates.  Teeth in good repair. Neck:  supple, full ROM, no masses, no thyromegaly, no thyroid nodules or tenderness, no JVD, normal carotid upstroke, no carotid bruits, and no cervical lymphadenopathy.   Chest Wall:  No deformities, masses, or tenderness noted. Breasts:  No mass, nodules, thickening, tenderness, bulging, retraction, inflamation, nipple discharge or skin changes  noted.   Lungs:  Normal respiratory effort, chest expands symmetrically. Lungs are clear to auscultation, no crackles or wheezes. Heart:  Normal rate and regular rhythm. S1 and S2 normal without gallop, murmur, click, rub or other extra sounds. Abdomen:  soft, non-tender, normal bowel sounds, no distention, no masses, no guarding, no rigidity, no rebound tenderness, no hepatomegaly, and no splenomegaly.  abdominal scar(s).   Rectal:  no external abnormalities, normal sphincter tone, no masses, no tenderness, no fissures, no fistulae, no perianal rash, and external hemorrhoid(s).   Msk:  No deformity or scoliosis noted of thoracic or lumbar spine.   Pulses:  R and L carotid,radial,femoral,dorsalis pedis and posterior tibial pulses are full and equal bilaterally Extremities:  No clubbing, cyanosis, edema, or deformity noted with normal full range of motion of all joints.   Neurologic:  No cranial nerve deficits noted. Station and gait are normal. Plantar reflexes are down-going bilaterally. DTRs are symmetrical throughout. Sensory, motor and coordinative functions appear intact. Skin:  turgor normal, color normal, no rashes, no suspicious lesions, and no ecchymoses.   Cervical Nodes:  no anterior cervical  adenopathy and no posterior cervical adenopathy.   Axillary Nodes:  no R axillary adenopathy and no L axillary adenopathy.   Inguinal Nodes:  no R inguinal adenopathy and no L inguinal adenopathy.   Psych:  Cognition and judgment appear intact. Alert and cooperative with normal attention span and concentration. No apparent delusions, illusions, hallucinations  Diabetes Management Exam:    Foot Exam (with socks and/or shoes not present):       Sensory-Pinprick/Light touch:          Left medial foot (L-4): normal          Left dorsal foot (L-5): normal          Left lateral foot (S-1): normal          Right medial foot (L-4): normal          Right dorsal foot (L-5): normal          Right lateral  foot (S-1): normal       Sensory-Monofilament:          Left foot: normal          Right foot: normal       Inspection:          Left foot: normal          Right foot: normal       Nails:          Left foot: normal          Right foot: normal   Impression & Recommendations:  Problem # 1:  ROUTINE GENERAL MEDICAL EXAM@HEALTH  CARE FACL (ICD-V70.0) Assessment New  Orders: Radiology Referral (Radiology) Hemoccult Guaiac-1 spec.(in office) (52841) EKG w/ Interpretation (93000)  Mammogram: Normal Bilateral (03/12/2008) Pap smear: Normal (08/01/2007) Colonoscopy: Location:  [facility].   (05/19/2003) Td Booster: Tdap (05/07/2008)   Pneumovax: Pneumovax (05/07/2008)  Discussed using sunscreen, use of alcohol, drug use, self breast exam, routine dental care, routine eye care, schedule for GYN exam, routine physical exam, seat belts, multiple vitamins, osteoporosis prevention, adequate calcium intake in diet, recommendations for immunizations, mammograms and Pap smears.  Discussed exercise and checking cholesterol.    Problem # 2:  DIABETES MELLITUS, TYPE II (ICD-250.00) Assessment: Unchanged  Her updated medication list for this problem includes:    Benicar Hct 40-12.5 Mg Tabs (Olmesartan medoxomil-hctz) .[name]... Take 1 tablet by mouth once a day  Orders: Venipuncture (32440) TLB-Lipid Panel (80061-LIPID) TLB-BMP (Basic Metabolic Panel-BMET) (80048-METABOL) TLB-CBC Platelet - w/Differential (85025-CBCD) TLB-Hepatic/Liver Function Pnl (80076-HEPATIC) TLB-TSH (Thyroid Stimulating Hormone) (84443-TSH) TLB-A1C / Hgb A1C (Glycohemoglobin) (83036-A1C) TLB-Udip w/ Micro (81001-URINE) Ophthalmology Referral (Ophthalmology)  Problem # 3:  HYPERTENSION (ICD-401.9) Assessment: Improved  Her updated medication list for this problem includes:    Benicar Hct 40-12.5 Mg Tabs (Olmesartan medoxomil-hctz) .[name]... Take 1 tablet by mouth once a day    Metoprolol Succinate 50 Mg  Xr24h-tab (Metoprolol succinate) .[name]... Take 1 tablet by mouth once a day  Orders: Venipuncture (10272) TLB-Lipid Panel (80061-LIPID) TLB-BMP (Basic Metabolic Panel-BMET) (80048-METABOL) TLB-CBC Platelet - w/Differential (85025-CBCD) TLB-Hepatic/Liver Function Pnl (80076-HEPATIC) TLB-TSH (Thyroid Stimulating Hormone) (84443-TSH) TLB-A1C / Hgb A1C (Glycohemoglobin) (83036-A1C) TLB-Udip w/ Micro (81001-URINE)  BP today: 134/82 Prior BP: 144/88 (01/20/2009)  Complete Medication List: 1)  Benicar Hct 40-12.5 Mg Tabs (Olmesartan medoxomil-hctz) .... Take 1 tablet by mouth once a day 2)  Metoprolol Succinate 50 Mg Xr24h-tab (Metoprolol succinate) .... Take 1 tablet by mouth once a day 3)  Co Q 10 Caps  .... Take 1 tablet  by mouth once a day 4)  Dhea 25 Mg Tabs (Prasterone (dhea)) 5)  Acai Berry 500 Mg Caps (Acai) 6)  Glucosamine  .... Take 1 tablet by mouth three times a day  Hypertension Assessment/Plan:      The patient's hypertensive risk group is category C: Target organ damage and/or diabetes.  Today's blood pressure is 134/82.  Her blood pressure goal is < 130/80.  Colorectal Screening:  Current Recommendations:    Hemoccult: NEG X 1 today    Colonoscopy recommended: scheduled with [facility].  PAP Screening:    Hx Cervical Dysplasia in last 5 yrs? No    3 normal PAP smears in last 5 yrs? Yes    Last PAP smear:  08/01/2007    Reviewed PAP smear recommendations:  patient defers to GYN provider  PAP Smear Results:    Date of Exam:  08/01/2007    Results:  Normal  Mammogram Screening:    Last Mammogram:  03/12/2008    Reviewed Mammogram recommendations:  mammogram ordered  Osteoporosis Risk Assessment:  Risk Factors for Fracture or Low Bone Density:   Race (White or Asian):     yes   Smoking status:       never  Immunization & Chemoprophylaxis:    Varicella vaccine: Varicella  (09/06/2006)    Tetanus vaccine: Tdap  (05/07/2008)    Pneumovax: Pneumovax   (05/07/2008)  Patient Instructions: 1)  It is important that you exercise regularly at least 20 minutes 5 times a week. If you develop chest pain, have severe difficulty breathing, or feel very tired , stop exercising immediately and seek medical attention. 2)  You need to lose weight. Consider a lower calorie diet and regular exercise.  3)  Schedule your mammogram. 4)  Check your blood sugars regularly. If your readings are usually above 200 or below 70 you should contact our office. 5)  It is important that your Diabetic A1c level is checked every 3 months. 6)  See your eye doctor yearly to check for diabetic eye damage. 7)  Check your feet each night for sore areas, calluses or signs of infection. 8)  Check your Blood Pressure regularly. If it is above 130/80: you should make an appointment. 9)  Please schedule a follow-up appointment in 4 months.

## 2010-02-23 NOTE — Progress Notes (Signed)
Summary: Benicar Alt  Phone Note Call from Patient Call back at Home Phone [phone]   Summary of Call: Patient is requesting generic alternative to benicar due to high cost.  Initial call taken by: Lamar Sprinkles, CMA,  August 13, 2009 3:51 PM  Follow-up for Phone Call        need a pharmacy Follow-up by: Etta Grandchild MD,  August 13, 2009 3:56 PM  Additional Follow-up for Phone Call Additional follow up Details #1::        Pt advised via VM that RX was sent to [facility] (same pharmacy pt's mother uses). I advised pt to call back if this pharmacy was inconvenient and we can sent to another. Additional Follow-up by: Margaret Pyle, CMA,  August 14, 2009 9:06 AM    New/Updated Medications: LOSARTAN POTASSIUM-HCTZ 100-25 MG TABS (LOSARTAN POTASSIUM-HCTZ) One by mouth once daily for high blood pressure Prescriptions: LOSARTAN POTASSIUM-HCTZ 100-25 MG TABS (LOSARTAN POTASSIUM-HCTZ) One by mouth once daily for high blood pressure  #30 x 11   Entered by:   Margaret Pyle, CMA   Authorized by:   Etta Grandchild MD   Signed by:   Margaret Pyle, CMA on 08/14/2009   Method used:   Electronically to        [facility]. [phone]* (retail)       [address]       [place], [place]  [address]       Ph: [phone]       Fax: [phone]   RxID:   [id number] LOSARTAN POTASSIUM-HCTZ 100-25 MG TABS (LOSARTAN POTASSIUM-HCTZ) One by mouth once daily for high blood pressure  #30 x 11   Entered and Authorized by:   Etta Grandchild MD   Signed by:   Etta Grandchild MD on 08/13/2009   Method used:   Historical   RxID:   [id number]

## 2010-02-23 NOTE — Letter (Signed)
Summary: Results Follow-up Letter  [facility]  [address] [place], [place] [address]   Phone: [phone]  Fax: [phone]    07/02/2009  [address] [place], [place]  [address]  Dear Ms. Tumblin,   The following are the results of your recent test(s):  Test     Result     Blood sugars   mildly high, you have diabetes that needs to be          treated Liver/kidney   normal CBC       normal Thyroid     normal Urine       trace of infection   _________________________________________________________  Please call for an appointment soon _________________________________________________________ _________________________________________________________ _________________________________________________________  Sincerely,  Sanda Linger MD [facility]

## 2010-02-23 NOTE — Letter (Signed)
Summary: [facility] Instructions  [facility]  [address] [place], [place] [address]   Phone: [phone]  Fax: [phone]       TEFFANY [id number]    68-27-1942    MRN: [id number]        Procedure Day Dorna Bloom:  [facility]  08/05/09     Arrival Time:  9:30am     Procedure Time: 10:30am     Location of Procedure:                    _X _  [facility] (4th Floor)                        PREPARATION FOR COLONOSCOPY WITH MOVIPREP   Starting 5 days prior to your procedure  FRIDAY 07/08  do not eat nuts, seeds, popcorn, corn, beans, peas,  salads, or any raw vegetables.  Do not take any fiber supplements (e.g. Metamucil, Citrucel, and Benefiber).  THE DAY BEFORE YOUR PROCEDURE         DATE:  TUESDAY  07/12  1.  Drink clear liquids the entire day-NO SOLID FOOD  2.  Do not drink anything colored red or purple.  Avoid juices with pulp.  No orange juice.  3.  Drink at least 64 oz. (8 glasses) of fluid/clear liquids during the day to prevent dehydration and help the prep work efficiently.  CLEAR LIQUIDS INCLUDE: Water Jello Ice Popsicles Tea (sugar ok, no milk/cream) Powdered fruit flavored drinks Coffee (sugar ok, no milk/cream) Gatorade Juice: apple, white grape, white cranberry  Lemonade Clear bullion, consomm, broth Carbonated beverages (any kind) Strained chicken noodle soup Hard Candy                             4.  In the morning, mix first dose of MoviPrep solution:    Empty 1 Pouch A and 1 Pouch B into the disposable container    Add lukewarm drinking water to the top line of the container. Mix to dissolve    Refrigerate (mixed solution should be used within 24 hrs)  5.  Begin drinking the prep at 5:00 p.m. The MoviPrep container is divided by 4 marks.   Every 15 minutes drink the solution down to the next mark (approximately 8 oz) until the full liter is complete.   6.  Follow completed prep with 16 oz of clear liquid of your choice  (Nothing red or purple).  Continue to drink clear liquids until bedtime.  7.  Before going to bed, mix second dose of MoviPrep solution:    Empty 1 Pouch A and 1 Pouch B into the disposable container    Add lukewarm drinking water to the top line of the container. Mix to dissolve    Refrigerate  THE DAY OF YOUR PROCEDURE      DATE: [facility]  07/13  Beginning at  5:30 a.m. (5 hours before procedure):         1. Every 15 minutes, drink the solution down to the next mark (approx 8 oz) until the full liter is complete.  2. Follow completed prep with 16 oz. of clear liquid of your choice.    3. You may drink clear liquids until  8:30am  (2 HOURS BEFORE PROCEDURE).   MEDICATION INSTRUCTIONS  Unless otherwise instructed, you should take regular prescription medications with a small sip of water   as  early as possible the morning of your procedure.  HOLD BENICAR-HCT before coming in the day of procedure (hold that day only)         OTHER INSTRUCTIONS  You will need a responsible adult at least 68 years of age to accompany you and drive you home.   This person must remain in the waiting room during your procedure.  Wear loose fitting clothing that is easily removed.  Leave jewelry and other valuables at home.  However, you may wish to bring a book to read or  an iPod/MP3 player to listen to music as you wait for your procedure to start.  Remove all body piercing jewelry and leave at home.  Total time from sign-in until discharge is approximately 2-3 hours.  You should go home directly after your procedure and rest.  You can resume normal activities the  day after your procedure.  The day of your procedure you should not:   Drive   Make legal decisions   Operate machinery   Drink alcohol   Return to work  You will receive specific instructions about eating, activities and medications before you leave.    The above instructions have been reviewed and explained  to me by   Karl Bales RN  July 22, 2009 10:30 AM    I fully understand and can verbalize these instructions _____________________________ Date _________

## 2010-02-23 NOTE — Letter (Signed)
Summary: Patient Notice- Polyp Results  [facility]  [address] [place], [place] [address]   Phone: [phone]  Fax: [phone]        August 07, 2009 MRN: [id number]    [facility] [address] [place], [place]  [address]    Dear Nicole [name],  I am pleased to inform you that the colon polyp(s) removed during your recent colonoscopy was (were) found to be benign (no cancer detected) upon pathologic examination.  I recommend you have a repeat colonoscopy examination in 5_ years to look for recurrent polyps, as having colon polyps increases your risk for having recurrent polyps or even colon cancer in the future.  Should you develop new or worsening symptoms of abdominal pain, bowel habit changes or bleeding from the rectum or bowels, please schedule an evaluation with either your primary care physician or with me.  Additional information/recommendations:  x__ No further action with gastroenterology is needed at this time. Please      follow-up with your primary care physician for your other healthcare      needs.  __ Please call [phone] to schedule a return visit to review your      situation.  __ Please keep your follow-up visit as already scheduled.  __ Continue treatment plan as outlined the day of your exam.  Please call us if you are having persistent problems or have questions about your condition that have not been fully answered at this time.  Sincerely,  Mardella Layman MD [facility]  This letter has been electronically signed by your physician.  Appended Document: Patient Notice- Polyp Results letter mailed.

## 2010-02-23 NOTE — Letter (Signed)
Summary: Lipid Letter  [facility]  [address] [place], [place] [address]   Phone: [phone]  Fax: [phone]    09/30/2009  Nicole [name] [address] [place], [place]  [address]  Dear Nicole [name]:  We have carefully reviewed your last lipid profile from  and the results are noted below with a summary of recommendations for lipid management.    Cholesterol:       169     Goal: <200   HDL "good" Cholesterol:   57.84     Goal: >40   LDL "bad" Cholesterol:   76     Goal: <100   Triglycerides:       277.0     Goal: <150 !!!!!    and your blood sugars are higher than before, please lose weight    TLC Diet (Therapeutic Lifestyle Change): Saturated Fats & Transfatty acids should be kept < 7% of total calories ***Reduce Saturated Fats Polyunstaurated Fat can be up to 10% of total calories Monounsaturated Fat Fat can be up to 20% of total calories Total Fat should be no greater than 25-35% of total calories Carbohydrates should be 50-60% of total calories Protein should be approximately 15% of total calories Fiber should be at least 20-30 grams a day ***Increased fiber may help lower LDL Total Cholesterol should be < 200mg /day Consider adding plant stanol/sterols to diet (example: Benacol spread) ***A higher intake of unsaturated fat may reduce Triglycerides and Increase HDL    Adjunctive Measures (may lower LIPIDS and reduce risk of Heart Attack) include: Aerobic Exercise (20-30 minutes 3-4 times a week) Limit Alcohol Consumption Weight Reduction Aspirin 75-81 mg a day by mouth (if not allergic or contraindicated) Dietary Fiber 20-30 grams a day by mouth     Current Medications: 1)    Metoprolol Succinate 50 Mg Xr24h-tab (Metoprolol succinate) .... Take 1 tablet by mouth once a day 2)    Co Q 10 Caps  .... Take 1 tablet by mouth once a day 3)    Dhea 25 Mg Tabs (Prasterone (dhea)) .... Take 1 tablet by mouth two times a day. 4)    Acai Berry 500 Mg Caps  (Acai) .... Take 1 capsule by mouth once a day. 5)    Glucosamine  .... Take 1 tablet by mouth three times a day 6)    Lovaza 1 Gm Caps (Omega-3-acid ethyl esters) .... 2 by mouth two times a day 7)    Losartan Potassium-hctz 100-25 Mg Tabs (Losartan potassium-hctz) .... One by mouth once daily for high blood pressure 8)    Freestyle Test  Strp (Glucose blood) .[name].. 1 strip once a day 9)    Januvia 100 Mg Tabs (Sitagliptin phosphate) .... One by mouth once daily for daibetes  If you have any questions, please call. We appreciate being able to work with you.   Sincerely,    [facility] Etta Grandchild MD  Appended Document: Lipid Letter Mailed.  Appended Document: Lipid Letter Mailed.

## 2010-02-23 NOTE — Assessment & Plan Note (Signed)
Summary: f/u appt/#/cd--Rm8   Vital Signs:  Patient profile:   70 year old female Height:      66 inches Weight:      203.75 pounds BMI:     33.00 O2 Sat:      98 % on Room air Temp:     98.0 degrees F oral Pulse rate:   78 / minute Pulse rhythm:   regular Resp:     16 per minute BP sitting:   130 / 70  (left arm) Cuff size:   large  Vitals Entered By: Mervin Kung CMA Duncan Dull) (September 30, 2009 8:36 AM)  Nutrition Counseling: Patient's BMI is greater than 25 and therefore counseled on weight management options.  O2 Flow:  Room air CC: Rm 8   Follow up of diabetes. , Preventive Care, Hypertension Management, Lipid Management Is Patient Diabetic? Yes   Primary Care Provider:  Etta Grandchild MD  CC:  Rm 8   Follow up of diabetes. , Preventive Care, Hypertension Management, and Lipid Management.  History of Present Illness: She returns with a complaint that she has had some fatigue, DOE, and mild diaphoresis recently.  Hypertension History:      She complains of dyspnea with exertion, but denies headache, chest pain, palpitations, orthopnea, PND, peripheral edema, visual symptoms, neurologic problems, syncope, and side effects from treatment.  She notes no problems with any antihypertensive medication side effects.        Positive major cardiovascular risk factors include female age 45 years old or older, diabetes, hyperlipidemia, and hypertension.  Negative major cardiovascular risk factors include negative family history for ischemic heart disease and non-tobacco-user status.        Further assessment for target organ damage reveals no history of ASHD, cardiac end-organ damage (CHF/LVH), stroke/TIA, peripheral vascular disease, renal insufficiency, or hypertensive retinopathy.    Lipid Management History:      Positive NCEP/ATP III risk factors include female age 35 years old or older, diabetes, HDL cholesterol less than 40, and hypertension.  Negative NCEP/ATP III risk  factors include no family history for ischemic heart disease, non-tobacco-user status, no ASHD (atherosclerotic heart disease), no prior stroke/TIA, no peripheral vascular disease, and no history of aortic aneurysm.        The patient states that she knows about the "Therapeutic Lifestyle Change" diet.  Her compliance with the TLC diet is good.  The patient expresses understanding of adjunctive measures for cholesterol lowering.  Adjunctive measures started by the patient include fiber, limit alcohol consumpton, and weight reduction.  She expresses no side effects from her lipid-lowering medication.  The patient denies any symptoms to suggest myopathy or liver disease.      Allergies (verified): No Known Drug Allergies  Past History:  Past Medical History: Last updated: 01/20/2009 Uterine/Cervical cancer, hx of Hypertension Low back pain Osteoarthritis Diabetes mellitus, type II  Past Surgical History: Last updated: 01/20/2009 Appendectomy Hysterectomy  Family History: Last updated: 01/20/2009 Family History of Arthritis Family History of Colon CA 1st degree relative <60 Family History Diabetes 1st degree relative Family History High cholesterol Family History Hypertension  Social History: Last updated: 01/20/2009 Retired Married Never Smoked Alcohol use-no Drug use-no Regular exercise-yes  Risk Factors: Alcohol Use: 0 (07/15/2009) Exercise: yes (01/20/2009)  Risk Factors: Smoking Status: never (07/15/2009)  Family History: Reviewed history from 01/20/2009 and no changes required. Family History of Arthritis Family History of Colon CA 1st degree relative <60 Family History Diabetes 1st degree relative Family History  High cholesterol Family History Hypertension  Social History: Reviewed history from 01/20/2009 and no changes required. Retired Married Never Smoked Alcohol use-no Drug use-no Regular exercise-yes  Review of Systems       The patient  complains of dyspnea on exertion.  The patient denies anorexia, fever, weight loss, weight gain, chest pain, syncope, peripheral edema, prolonged cough, headaches, hemoptysis, abdominal pain, difficulty walking, and depression.   CV:  Complains of shortness of breath with exertion; denies chest pain or discomfort, difficulty breathing at night, fainting, fatigue, lightheadness, near fainting, palpitations, swelling of feet, swelling of hands, and weight gain. Endo:  Denies cold intolerance, excessive hunger, excessive thirst, excessive urination, heat intolerance, polyuria, and weight change.  Physical Exam  General:  alert, well-developed, well-nourished, well-hydrated, appropriate dress, normal appearance, cooperative to examination, good hygiene, and overweight-appearing.   Head:  normocephalic and atraumatic.   Mouth:  Oral mucosa and oropharynx without lesions or exudates.  Teeth in good repair. Neck:  supple, full ROM, no masses, no thyromegaly, no thyroid nodules or tenderness, no JVD, normal carotid upstroke, no carotid bruits, and no cervical lymphadenopathy.   Lungs:  Normal respiratory effort, chest expands symmetrically. Lungs are clear to auscultation, no crackles or wheezes. Heart:  Normal rate and regular rhythm. S1 and S2 normal without gallop, murmur, click, rub or other extra sounds. Abdomen:  soft, non-tender, normal bowel sounds, no distention, no masses, no guarding, no rigidity, no rebound tenderness, no hepatomegaly, and no splenomegaly.  abdominal scar(s).   Msk:  No deformity or scoliosis noted of thoracic or lumbar spine.   Pulses:  R and L carotid,radial,femoral,dorsalis pedis and posterior tibial pulses are full and equal bilaterally Extremities:  No clubbing, cyanosis, edema, or deformity noted with normal full range of motion of all joints.   Neurologic:  No cranial nerve deficits noted. Station and gait are normal. Plantar reflexes are down-going bilaterally. DTRs are  symmetrical throughout. Sensory, motor and coordinative functions appear intact.  Diabetes Management Exam:    Foot Exam (with socks and/or shoes not present):       Sensory-Pinprick/Light touch:          Left medial foot (L-4): normal          Left dorsal foot (L-5): normal          Left lateral foot (S-1): normal          Right medial foot (L-4): normal          Right dorsal foot (L-5): normal          Right lateral foot (S-1): normal       Sensory-Monofilament:          Left foot: normal          Right foot: normal       Inspection:          Left foot: normal          Right foot: normal       Nails:          Left foot: normal          Right foot: normal   Impression & Recommendations:  Problem # 1:  DIABETES MELLITUS, TYPE II (ICD-250.00) Assessment Unchanged  Her updated medication list for this problem includes:    Losartan Potassium-hctz 100-25 Mg Tabs (Losartan potassium-hctz) ..... One by mouth once daily for high blood pressure    Januvia 100 Mg Tabs (Sitagliptin phosphate) ..... One by mouth once daily for  daibetes  Orders: Venipuncture ([address]) TLB-Lipid Panel (80061-LIPID) TLB-BMP (Basic Metabolic Panel-BMET) (80048-METABOL) TLB-A1C / Hgb A1C (Glycohemoglobin) (83036-A1C) Ophthalmology Referral (Ophthalmology)  Problem # 2:  HYPERTRIGLYCERIDEMIA (ICD-272.1) Assessment: Unchanged  Her updated medication list for this problem includes:    Lovaza 1 Gm Caps (Omega-3-acid ethyl esters) .[name]... 2 by mouth two times a day  Orders: Venipuncture ([address]) TLB-Lipid Panel (80061-LIPID) TLB-BMP (Basic Metabolic Panel-BMET) (80048-METABOL) TLB-A1C / Hgb A1C (Glycohemoglobin) (83036-A1C)  Labs Reviewed: SGOT: 19 (07/02/2009)   SGPT: 22 (07/02/2009)  Prior 10 Yr Risk Heart Disease: Not enough information (07/02/2009)   HDL:37.40 (07/02/2009)  Chol:163 (07/02/2009)  Trig:246.0 (07/02/2009)  Problem # 3:  HYPERTENSION (ICD-401.9) Assessment: Improved  Her updated  medication list for this problem includes:    Metoprolol Succinate 50 Mg Xr24h-tab (Metoprolol succinate) .[name]... Take 1 tablet by mouth once a day    Losartan Potassium-hctz 100-25 Mg Tabs (Losartan potassium-hctz) ..... One by mouth once daily for high blood pressure  Orders: Venipuncture ([address]) TLB-Lipid Panel (80061-LIPID) TLB-BMP (Basic Metabolic Panel-BMET) (80048-METABOL) TLB-A1C / Hgb A1C (Glycohemoglobin) (83036-A1C)  BP today: 130/70 Prior BP: 130/76 (07/15/2009)  Prior 10 Yr Risk Heart Disease: Not enough information (07/02/2009)  Labs Reviewed: K+: 4.0 (07/02/2009) Creat: : 0.8 (07/02/2009)   Chol: 163 (07/02/2009)   HDL: 37.40 (07/02/2009)   TG: 246.0 (07/02/2009)  Problem # 4:  DYSPNEA (ICD-786.05) Assessment: New will check for CHF or cardiac ischemia, her EKG is normal today, this may be conditioning as I see no signs of edema on her exam today Orders: TLB-Cardiac Panel ([id number]) TLB-BNP (B-Natriuretic Peptide) (83880-BNPR) EKG w/ Interpretation (93[address])  Complete Medication List: 1)  Metoprolol Succinate 50 Mg Xr24h-tab (Metoprolol succinate) .... Take 1 tablet by mouth once a day 2)  Co Q 10 Caps  .... Take 1 tablet by mouth once a day 3)  Dhea 25 Mg Tabs (Prasterone (dhea)) .... Take 1 tablet by mouth two times a day. 4)  Acai Berry 500 Mg Caps (Acai) .... Take 1 capsule by mouth once a day. 5)  Glucosamine  .... Take 1 tablet by mouth three times a day 6)  Lovaza 1 Gm Caps (Omega-3-acid ethyl esters) .... 2 by mouth two times a day 7)  Losartan Potassium-hctz 100-25 Mg Tabs (Losartan potassium-hctz) .... One by mouth once daily for high blood pressure 8)  Freestyle Test Strp (Glucose blood) .[name].. 1 strip once a day 9)  Januvia 100 Mg Tabs (Sitagliptin phosphate) .... One by mouth once daily for daibetes  Other Orders: Radiology Referral (Radiology)  Hypertension Assessment/Plan:      The patient's hypertensive risk group is category C: Target organ  damage and/or diabetes.  Today's blood pressure is 130/70.  Her blood pressure goal is < 130/80.  Lipid Assessment/Plan:      Based on NCEP/ATP III, the patient's risk factor category is "history of diabetes".  The patient's lipid goals are as follows: Total cholesterol goal is 200; LDL cholesterol goal is 100; HDL cholesterol goal is 40; Triglyceride goal is 150.    PAP Screening:    Hx Cervical Dysplasia in last 5 yrs? No    3 normal PAP smears in last 5 yrs? Yes    Last PAP smear:  08/01/2007    Reviewed PAP smear recommendations:  patient defers to GYN provider  Mammogram Screening:    Last Mammogram:  03/12/2008    Reviewed Mammogram recommendations:  mammogram ordered  Osteoporosis Risk Assessment:  Risk Factors for Fracture or Low Bone Density:  Race (White or Asian):     yes   Smoking status:       never  Immunization & Chemoprophylaxis:    Varicella vaccine: Varicella  (09/06/2006)    Tetanus vaccine: Tdap  (05/07/2008)    Pneumovax: Pneumovax  (05/07/2008)  Patient Instructions: 1)  Please schedule a follow-up appointment in 3 months. 2)  It is important that you exercise regularly at least 20 minutes 5 times a week. If you develop chest pain, have severe difficulty breathing, or feel very tired , stop exercising immediately and seek medical attention. 3)  You need to lose weight. Consider a lower calorie diet and regular exercise.  4)  Check your blood sugars regularly. If your readings are usually above 200 or below 70 you should contact our office. 5)  It is important that your Diabetic A1c level is checked every 3 months. 6)  See your eye doctor yearly to check for diabetic eye damage. 7)  Check your feet each night for sore areas, calluses or signs of infection. 8)  Check your Blood Pressure regularly. If it is above 200: you should make an appointment. Prescriptions: JANUVIA 100 MG TABS (SITAGLIPTIN PHOSPHATE) One by mouth once daily for Daibetes  #112 x 0    Entered and Authorized by:   Etta Grandchild MD   Signed by:   Etta Grandchild MD on 09/30/2009   Method used:   Samples Given   RxID:   [id number]   Current Allergies (reviewed today): No known allergies

## 2010-02-23 NOTE — Procedures (Signed)
Summary: Colonoscopy  Patient: [name] Note: All result statuses are Final unless otherwise noted.  Tests: (1) Colonoscopy (COL)   COL Colonoscopy           DONE     [facility]     [address]     [place], [place]  [address]           COLONOSCOPY PROCEDURE REPORT           PATIENT:  Nicole [name], Nicole [name]  MR#:  [id number]     BIRTHDATE:  1940-06-17, 68 yrs. old  GENDER:  female     ENDOSCOPIST:  Vania Rea. Jarold Motto, MD, [facility]     REF. BY:  Etta Grandchild, M.D.     PROCEDURE DATE:  08/05/2009     PROCEDURE:  Colonoscopy with snare polypectomy     ASA CLASS:  Class II     INDICATIONS:  colorectal cancer screening     MEDICATIONS:   Fentanyl 50 mcg IV, Versed 6 mg IV           DESCRIPTION OF PROCEDURE:   After the risks benefits and     alternatives of the procedure were thoroughly explained, informed     consent was obtained.  Digital rectal exam was performed and     revealed no abnormalities.   The LB [id number] endoscope     was introduced through the anus and advanced to the cecum, which     was identified by both the appendix and ileocecal valve, without     limitations.  The quality of the prep was excellent, using     MoviPrep.  The instrument was then slowly withdrawn as the colon     was fully examined.     <<PROCEDUREIMAGES>>           FINDINGS:  Two polyps were found in the cecum. 2-3 MM FLAT POLYPS     COLD SNARE EXCISED.  This was otherwise a normal examination of     the colon.  External hemorrhoids were found.   Retroflexed views     in the rectum revealed external hemorrhoids.    The scope was then     withdrawn from the patient and the procedure completed.           COMPLICATIONS:  None     ENDOSCOPIC IMPRESSION:     1) Two polyps in the cecum     2) Otherwise normal examination     3) External hemorrhoids     r/o adenomas     RECOMMENDATIONS:     1) If the polyp(s) removed today are proven to be adenomatous     (pre-cancerous) polyps, you  will need a repeat colonoscopy in 5     years. Otherwise you should continue to follow colorectal cancer     screening guidelines for "routine risk" patients with colonoscopy     in 10 years.     REPEAT EXAM:  No           ______________________________     Vania Rea. Jarold Motto, MD, [facility]           CC:           n.     eSIGNED:   Vania Rea. Sajad Glander at 08/05/2009 11:09 AM           Birdie Riddle, [phone]  Note: An exclamation mark (!) indicates a result that was not dispersed into the flowsheet. Document Creation  Date: 08/05/2009 11:11 AM _______________________________________________________________________  (1) Order result status: Final Collection or observation date-time: 08/05/2009 11:04 Requested date-time:  Receipt date-time:  Reported date-time:  Referring Physician:   Ordering Physician: Sheryn Bison [phone]) Specimen Source:  Source: Launa Grill Order Number: [id number] Lab site:   Appended Document: Colonoscopy     Procedures Next Due Date:    Colonoscopy: 07/2014

## 2010-02-23 NOTE — Assessment & Plan Note (Signed)
Summary: FU ON BS /NWS  #   Vital Signs:  Patient profile:   70 year old female Height:      66 inches Weight:      205 pounds BMI:     33.21 O2 Sat:      98 % on Room air Temp:     97.7 degrees F oral Pulse rate:   87 / minute Pulse rhythm:   regular Resp:     16 per minute BP sitting:   130 / 76  (left arm) Cuff size:   large  Vitals Entered By: Rock Nephew CMA ([birth year] 2011 3:39 PM)  O2 Flow:  Room air  Primary Care Provider:  Etta Grandchild MD   History of Present Illness:  Follow-Up Visit      This is a 70 year old woman who presents for Follow-up visit.  The patient denies chest pain, palpitations, dizziness, syncope, low blood sugar symptoms, high blood sugar symptoms, edema, SOB, DOE, PND, and orthopnea.  Since the last visit the patient notes no new problems or concerns.  The patient reports taking meds as prescribed.  When questioned about possible medication side effects, the patient notes none.    Preventive Screening-Counseling & Management  Alcohol-Tobacco     Alcohol drinks/day: 0     Smoking Status: never  Hep-HIV-STD-Contraception     Hepatitis Risk: no risk noted     HIV Risk: no risk noted     STD Risk: no risk noted     SBE monthly: yes     SBE Education/Counseling: to perform regular SBE      Sexual History:  currently monogamous.        Drug Use:  no.        Blood Transfusions:  no.    Clinical Review Panels:  Diabetes Management   HgBA1C:  7.4 (07/02/2009)   Creatinine:  0.8 (07/02/2009)   Last Foot Exam:  yes (07/15/2009)   Last Pneumovax:  Pneumovax (05/07/2008)  CBC   WBC:  4.3 (07/02/2009)   RBC:  4.68 (07/02/2009)   Hgb:  14.4 (07/02/2009)   Hct:  41.5 (07/02/2009)   Platelets:  206.0 (07/02/2009)   MCV  88.8 (07/02/2009)   MCHC  34.6 (07/02/2009)   RDW  12.9 (07/02/2009)   PMN:  64.7 (07/02/2009)   Lymphs:  26.7 (07/02/2009)   Monos:  6.6 (07/02/2009)   Eosinophils:  1.6 (07/02/2009)   Basophil:  0.4  (07/02/2009)  Complete Metabolic Panel   Glucose:  139 (07/02/2009)   Sodium:  137 (07/02/2009)   Potassium:  4.0 (07/02/2009)   Chloride:  102 (07/02/2009)   CO2:  27 (07/02/2009)   BUN:  16 (07/02/2009)   Creatinine:  0.8 (07/02/2009)   Albumin:  3.6 (07/02/2009)   Total Protein:  6.6 (07/02/2009)   Calcium:  9.3 (07/02/2009)   Total Bili:  1.1 (07/02/2009)   Alk Phos:  42 (07/02/2009)   SGPT (ALT):  22 (07/02/2009)   SGOT (AST):  19 (07/02/2009)   Medications Prior to Update: 1)  Benicar Hct 40-12.5 Mg Tabs (Olmesartan Medoxomil-Hctz) .... Take 1 Tablet By Mouth Once A Day 2)  Metoprolol Succinate 50 Mg Xr24h-Tab (Metoprolol Succinate) .... Take 1 Tablet By Mouth Once A Day 3)  Co Q 10 Caps .... Take 1 Tablet By Mouth Once A Day 4)  Dhea 25 Mg Tabs (Prasterone (Dhea)) 5)  Acai Berry 500 Mg Caps (Acai) 6)  Glucosamine .... Take 1 Tablet By Mouth  Three Times A Day  Current Medications (verified): 1)  Benicar Hct 40-12.5 Mg Tabs (Olmesartan Medoxomil-Hctz) .... Take 1 Tablet By Mouth Once A Day 2)  Metoprolol Succinate 50 Mg Xr24h-Tab (Metoprolol Succinate) .... Take 1 Tablet By Mouth Once A Day 3)  Co Q 10 Caps .... Take 1 Tablet By Mouth Once A Day 4)  Dhea 25 Mg Tabs (Prasterone (Dhea)) 5)  Acai Berry 500 Mg Caps (Acai) 6)  Glucosamine .... Take 1 Tablet By Mouth Three Times A Day 7)  Lovaza 1 Gm Caps (Omega-3-Acid Ethyl Esters) .... 2 By Mouth Two Times A Day  Allergies (verified): No Known Drug Allergies  Past History:  Past Medical History: Last updated: 01/20/2009 Uterine/Cervical cancer, hx of Hypertension Low back pain Osteoarthritis Diabetes mellitus, type II  Past Surgical History: Last updated: 01/20/2009 Appendectomy Hysterectomy  Family History: Last updated: 01/20/2009 Family History of Arthritis Family History of Colon CA 1st degree relative <60 Family History Diabetes 1st degree relative Family History High cholesterol Family History  Hypertension  Social History: Last updated: 01/20/2009 Retired Married Never Smoked Alcohol use-no Drug use-no Regular exercise-yes  Risk Factors: Alcohol Use: 0 (07/15/2009) Exercise: yes (01/20/2009)  Risk Factors: Smoking Status: never (07/15/2009)  Family History: Reviewed history from 01/20/2009 and no changes required. Family History of Arthritis Family History of Colon CA 1st degree relative <60 Family History Diabetes 1st degree relative Family History High cholesterol Family History Hypertension  Social History: Reviewed history from 01/20/2009 and no changes required. Retired Married Never Smoked Alcohol use-no Drug use-no Regular exercise-yes  Review of Systems  The patient denies chest pain, syncope, dyspnea on exertion, peripheral edema, prolonged cough, headaches, hemoptysis, abdominal pain, and difficulty walking.    Physical Exam  General:  alert, well-developed, well-nourished, well-hydrated, appropriate dress, normal appearance, cooperative to examination, good hygiene, and overweight-appearing.   Mouth:  Oral mucosa and oropharynx without lesions or exudates.  Teeth in good repair. Neck:  supple, full ROM, no masses, no thyromegaly, no thyroid nodules or tenderness, no JVD, normal carotid upstroke, no carotid bruits, and no cervical lymphadenopathy.   Lungs:  Normal respiratory effort, chest expands symmetrically. Lungs are clear to auscultation, no crackles or wheezes. Heart:  Normal rate and regular rhythm. S1 and S2 normal without gallop, murmur, click, rub or other extra sounds. Abdomen:  soft, non-tender, normal bowel sounds, no distention, no masses, no guarding, no rigidity, no rebound tenderness, no hepatomegaly, and no splenomegaly.  abdominal scar(s).   Msk:  No deformity or scoliosis noted of thoracic or lumbar spine.   Pulses:  R and L carotid,radial,femoral,dorsalis pedis and posterior tibial pulses are full and equal  bilaterally Extremities:  No clubbing, cyanosis, edema, or deformity noted with normal full range of motion of all joints.   Neurologic:  No cranial nerve deficits noted. Station and gait are normal. Plantar reflexes are down-going bilaterally. DTRs are symmetrical throughout. Sensory, motor and coordinative functions appear intact. Skin:  turgor normal, color normal, no rashes, no suspicious lesions, and no ecchymoses.   Cervical Nodes:  no anterior cervical adenopathy and no posterior cervical adenopathy.   Psych:  Cognition and judgment appear intact. Alert and cooperative with normal attention span and concentration. No apparent delusions, illusions, hallucinations  Diabetes Management Exam:    Foot Exam (with socks and/or shoes not present):       Sensory-Pinprick/Light touch:          Left medial foot (L-4): normal  Left dorsal foot (L-5): normal          Left lateral foot (S-1): normal          Right medial foot (L-4): normal          Right dorsal foot (L-5): normal          Right lateral foot (S-1): normal       Sensory-Monofilament:          Left foot: normal          Right foot: normal       Inspection:          Left foot: normal          Right foot: normal       Nails:          Left foot: normal          Right foot: normal   Impression & Recommendations:  Problem # 1:  HYPERTRIGLYCERIDEMIA (ICD-272.1) Assessment New  Her updated medication list for this problem includes:    Lovaza 1 Gm Caps (Omega-3-acid ethyl esters) .[name]... 2 by mouth two times a day  Labs Reviewed: SGOT: 19 (07/02/2009)   SGPT: 22 (07/02/2009)  Prior 10 Yr Risk Heart Disease: Not enough information (07/02/2009)   HDL:37.40 (07/02/2009)  Chol:163 (07/02/2009)  Trig:246.0 (07/02/2009)  Problem # 2:  DIABETES MELLITUS, TYPE II (ICD-250.00) Assessment: Deteriorated  she is not willing to use a med. to treat this, instead she wants to work on lifestyle modifications  Her updated medication  list for this problem includes:    Benicar Hct 40-12.5 Mg Tabs (Olmesartan medoxomil-hctz) .[name]... Take 1 tablet by mouth once a day  Labs Reviewed: Creat: 0.8 (07/02/2009)    Reviewed HgBA1c results: 7.4 (07/02/2009)  Problem # 3:  HYPERTENSION (ICD-401.9)  Her updated medication list for this problem includes:    Benicar Hct 40-12.5 Mg Tabs (Olmesartan medoxomil-hctz) .[name]... Take 1 tablet by mouth once a day    Metoprolol Succinate 50 Mg Xr24h-tab (Metoprolol succinate) .[name]... Take 1 tablet by mouth once a day  BP today: 130/76 Prior BP: 134/82 (07/02/2009)  Prior 10 Yr Risk Heart Disease: Not enough information (07/02/2009)  Labs Reviewed: K+: 4.0 (07/02/2009) Creat: : 0.8 (07/02/2009)   Chol: 163 (07/02/2009)   HDL: 37.40 (07/02/2009)   TG: 246.0 (07/02/2009)  Complete Medication List: 1)  Benicar Hct 40-12.5 Mg Tabs (Olmesartan medoxomil-hctz) .... Take 1 tablet by mouth once a day 2)  Metoprolol Succinate 50 Mg Xr24h-tab (Metoprolol succinate) .... Take 1 tablet by mouth once a day 3)  Co Q 10 Caps  .... Take 1 tablet by mouth once a day 4)  Dhea 25 Mg Tabs (Prasterone (dhea)) 5)  Acai Berry 500 Mg Caps (Acai) 6)  Glucosamine  .... Take 1 tablet by mouth three times a day 7)  Lovaza 1 Gm Caps (Omega-3-acid ethyl esters) .... 2 by mouth two times a day  Patient Instructions: 1)  Please schedule a follow-up appointment in 3 months. 2)  It is important that you exercise regularly at least 20 minutes 5 times a week. If you develop chest pain, have severe difficulty breathing, or feel very tired , stop exercising immediately and seek medical attention. 3)  You need to lose weight. Consider a lower calorie diet and regular exercise.  4)  Check your blood sugars regularly. If your readings are usually above 200 or below 70 you should contact our office. 5)  It is important that your Diabetic  A1c level is checked every 3 months. 6)  See your eye doctor yearly to check for diabetic eye  damage. 7)  Check your feet each night for sore areas, calluses or signs of infection. 8)  Check your Blood Pressure regularly. If it is above 130/80: you should make an appointment. Prescriptions: LOVAZA 1 GM CAPS (OMEGA-3-ACID ETHYL ESTERS) 2 by mouth two times a day  #120 x 11   Entered and Authorized by:   Etta Grandchild MD   Signed by:   Etta Grandchild MD on 07/15/2009   Method used:   Electronically to        [facility]. [phone]* (retail)       [address]       [place], [place]  [address]       Ph: [phone]       Fax: [phone]   RxID:   [id number]

## 2010-02-23 NOTE — Progress Notes (Signed)
  Phone Note Refill Request Message from:  Fax from Pharmacy on August 31, 2009 3:31 PM  Refills Requested: Medication #1:  FREESTYLE TEST  STRP 1 strip once a day. Initial call taken by: Ami Bullins CMA,  August 31, 2009 3:31 PM    Prescriptions: FREESTYLE TEST  STRP (GLUCOSE BLOOD) 1 strip once a day  #30 x 6   Entered by:   Ami Bullins CMA   Authorized by:   Etta Grandchild MD   Signed by:   Bill Salinas CMA on 08/31/2009   Method used:   Electronically to        [facility]. [phone]* (retail)       [address]       [place], [place]  [address]       Ph: [phone]       Fax: [phone]   RxID:   [id number]

## 2010-02-23 NOTE — Assessment & Plan Note (Signed)
Summary: new / medicare / ok dr Regan Llorente!/#/cd   Vital Signs:  Patient profile:   70 year old female Height:      66 inches Weight:      203 pounds BMI:     32.88 O2 Sat:      99 % on Room air Temp:     98.5 degrees F oral Pulse rate:   86 / minute Pulse rhythm:   regular Resp:     16 per minute BP sitting:   144 / 88  (left arm) Cuff size:   large  Vitals Entered By: Rock Nephew CMA (January 20, 2009 9:20 AM)  O2 Flow:  Room air  Primary Care Provider:  Etta Grandchild MD   History of Present Illness: New to me to establish new PCP. No complaints offered today.  Preventive Screening-Counseling & Management  Alcohol-Tobacco     Alcohol drinks/day: 0     Smoking Status: never  Caffeine-Diet-Exercise     Does Patient Exercise: yes  Hep-HIV-STD-Contraception     Hepatitis Risk: no risk noted     HIV Risk: no risk noted     STD Risk: no risk noted      Sexual History:  currently monogamous.        Drug Use:  no.        Blood Transfusions:  no.    Current Medications (verified): 1)  Benicar Hct 40-12.5 Mg Tabs (Olmesartan Medoxomil-Hctz) .... Take 1 Tablet By Mouth Once A Day 2)  Metoprolol Succinate 50 Mg Xr24h-Tab (Metoprolol Succinate) .... Take 1 Tablet By Mouth Once A Day  Allergies (verified): No Known Drug Allergies  Past History:  Past Medical History: Uterine/Cervical cancer, hx of Hypertension Low back pain Osteoarthritis Diabetes mellitus, type II  Past Surgical History: Appendectomy Hysterectomy  Family History: Family History of Arthritis Family History of Colon CA 1st degree relative <60 Family History Diabetes 1st degree relative Family History High cholesterol Family History Hypertension  Social History: Retired Married Never Smoked Alcohol use-no Drug use-no Regular exercise-yes Smoking Status:  never Hepatitis Risk:  no risk noted HIV Risk:  no risk noted STD Risk:  no risk noted Sexual History:  currently monogamous  Blood Transfusions:  no Drug Use:  no Does Patient Exercise:  yes  Review of Systems       The patient complains of weight gain.  The patient denies anorexia, fever, weight loss, chest pain, syncope, dyspnea on exertion, peripheral edema, prolonged cough, headaches, hemoptysis, abdominal pain, hematuria, and depression.   MS:  Complains of joint pain and low back pain; denies joint redness, joint swelling, loss of strength, mid back pain, muscle aches, cramps, muscle weakness, and stiffness.  Physical Exam  General:  alert, well-developed, well-nourished, well-hydrated, appropriate dress, normal appearance, cooperative to examination, good hygiene, and overweight-appearing.   Head:  normocephalic and atraumatic.   Mouth:  Oral mucosa and oropharynx without lesions or exudates.  Teeth in good repair. Neck:  supple, full ROM, no masses, no thyromegaly, no thyroid nodules or tenderness, no JVD, normal carotid upstroke, no carotid bruits, and no cervical lymphadenopathy.   Lungs:  normal respiratory effort, no intercostal retractions, no accessory muscle use, normal breath sounds, no dullness, no fremitus, no crackles, and no wheezes.   Heart:  normal rate, regular rhythm, no murmur, no gallop, no rub, and no JVD.   Abdomen:  soft, non-tender, normal bowel sounds, no distention, no masses, no guarding, no rigidity, no rebound tenderness, no hepatomegaly,  and no splenomegaly.   Msk:  normal ROM, no joint tenderness, no joint swelling, no joint warmth, no redness over joints, no joint instability, no crepitation, enlarged MCP joints, enlarged PIP joints, and enlarged DIP joints.   Pulses:  R and L carotid,radial,femoral,dorsalis pedis and posterior tibial pulses are full and equal bilaterally Extremities:  No clubbing, cyanosis, edema, or deformity noted with normal full range of motion of all joints.   Neurologic:  No cranial nerve deficits noted. Station and gait are normal. Plantar reflexes are  down-going bilaterally. DTRs are symmetrical throughout. Sensory, motor and coordinative functions appear intact. Skin:  turgor normal, color normal, no rashes, no suspicious lesions, and no ecchymoses.   Cervical Nodes:  no anterior cervical adenopathy and no posterior cervical adenopathy.   Psych:  Cognition and judgment appear intact. Alert and cooperative with normal attention span and concentration. No apparent delusions, illusions, hallucinations  Diabetes Management Exam:    Foot Exam (with socks and/or shoes not present):       Sensory-Pinprick/Light touch:          Left medial foot (L-4): normal          Left dorsal foot (L-5): normal          Left lateral foot (S-1): normal          Right medial foot (L-4): normal          Right dorsal foot (L-5): normal          Right lateral foot (S-1): normal       Sensory-Monofilament:          Left foot: normal          Right foot: normal       Inspection:          Left foot: normal          Right foot: normal       Nails:          Left foot: normal          Right foot: normal   Impression & Recommendations:  Problem # 1:  DIABETES MELLITUS, TYPE II (ICD-250.00) Assessment Unchanged  Her updated medication list for this problem includes:    Benicar Hct 40-12.5 Mg Tabs (Olmesartan medoxomil-hctz) .[name]... Take 1 tablet by mouth once a day  Problem # 2:  HYPERTENSION (ICD-401.9) Assessment: Unchanged  Her updated medication list for this problem includes:    Benicar Hct 40-12.5 Mg Tabs (Olmesartan medoxomil-hctz) .[name]... Take 1 tablet by mouth once a day    Metoprolol Succinate 50 Mg Xr24h-tab (Metoprolol succinate) .[name]... Take 1 tablet by mouth once a day  BP today: 144/88  Problem # 3:  OSTEOARTHRITIS (ICD-715.90) Assessment: Unchanged  Complete Medication List: 1)  Benicar Hct 40-12.5 Mg Tabs (Olmesartan medoxomil-hctz) .... Take 1 tablet by mouth once a day 2)  Metoprolol Succinate 50 Mg Xr24h-tab (Metoprolol succinate) ....  Take 1 tablet by mouth once a day  Colorectal Screening:  Colonoscopy Results:    Date of Exam: 05/13/2003    Results: Normal  PAP Screening:    3 normal PAP smears in last 5 yrs? Yes  PAP Smear Results:    Date of Exam:  03/11/2008    Results:  Normal  Mammogram Screening:    Last Mammogram:  03/12/2008  Mammogram Results:    Date of Exam:  03/12/2008    Results:  Normal Bilateral  Osteoporosis Risk Assessment:  Risk Factors for Fracture or Low  Bone Density:   Race (White or Asian):     yes   Smoking status:       never  Immunization & Chemoprophylaxis:    Varicella vaccine: Varicella  (09/06/2006)    Tetanus vaccine: Tdap  (05/07/2008)    Pneumovax: Pneumovax  (05/07/2008)  Patient Instructions: 1)  Please schedule a follow-up appointment in 2 months. 2)  It is important that you exercise regularly at least 20 minutes 5 times a week. If you develop chest pain, have severe difficulty breathing, or feel very tired , stop exercising immediately and seek medical attention. 3)  You need to lose weight. Consider a lower calorie diet and regular exercise.  4)  Check your Blood Pressure regularly. If it is above 140/90: you should make an appointment. 5)  Take 650-1000mg  of Tylenol every 4-6 hours as needed for relief of pain or comfort of fever AVOID taking more than 4000mg   in a 24 hour period (can cause liver damage in higher doses).   Varicella Immunization History:    Varicella # 1:  Varicella (09/06/2006)  Tetanus/Td Immunization History:    Tetanus/Td # 1:  Tdap (05/07/2008)  Pneumovax Immunization History:    Pneumovax # 1:  Pneumovax (05/07/2008)

## 2010-02-23 NOTE — Miscellaneous (Signed)
Summary: [facility] previsit  Clinical Lists Changes  Medications: Added new medication of MOVIPREP 100 GM  SOLR (PEG-KCL-NACL-NASULF-NA ASC-C) As per prep instructions. - Signed Rx of MOVIPREP 100 GM  SOLR (PEG-KCL-NACL-NASULF-NA ASC-C) As per prep instructions.;  #1 x 0;  Signed;  Entered by: Karl Bales RN;  Authorized by: Mardella Layman MD [facility];  Method used: Electronically to [facility]. #[id number]*, [address] [place], [place], [place]  [address], Ph: [phone], Fax: [phone] Observations: Added new observation of NKA: T (07/22/2009 9:55)    Prescriptions: MOVIPREP 100 GM  SOLR (PEG-KCL-NACL-NASULF-NA ASC-C) As per prep instructions.  #1 x 0   Entered by:   Karl Bales RN   Authorized by:   Mardella Layman MD [facility]   Signed by:   Karl Bales RN on 07/22/2009   Method used:   Electronically to        [facility]. [phone]* (retail)       [address]       [place], [place]  [address]       Ph: [phone]       Fax: [phone]   RxID:   [id number]

## 2010-02-25 NOTE — Progress Notes (Signed)
  Phone Note Other Incoming   Request: Send information Summary of Call: Faxed EKG from 09/30/2009 to [facility] [phone] Attn: Marylu Lund. If questions, please call [phone].

## 2010-03-16 ENCOUNTER — Ambulatory Visit (INDEPENDENT_AMBULATORY_CARE_PROVIDER_SITE_OTHER): Payer: Medicare Other | Admitting: Internal Medicine

## 2010-03-16 ENCOUNTER — Other Ambulatory Visit: Payer: Medicare Other

## 2010-03-16 ENCOUNTER — Encounter (INDEPENDENT_AMBULATORY_CARE_PROVIDER_SITE_OTHER): Payer: Self-pay | Admitting: *Deleted

## 2010-03-16 ENCOUNTER — Other Ambulatory Visit: Payer: Self-pay | Admitting: Internal Medicine

## 2010-03-16 ENCOUNTER — Encounter: Payer: Self-pay | Admitting: Internal Medicine

## 2010-03-16 DIAGNOSIS — I10 Essential (primary) hypertension: Secondary | ICD-10-CM

## 2010-03-16 DIAGNOSIS — E119 Type 2 diabetes mellitus without complications: Secondary | ICD-10-CM

## 2010-03-16 DIAGNOSIS — J029 Acute pharyngitis, unspecified: Secondary | ICD-10-CM

## 2010-03-16 DIAGNOSIS — E785 Hyperlipidemia, unspecified: Secondary | ICD-10-CM

## 2010-03-16 LAB — HEMOGLOBIN A1C: Hgb A1c MFr Bld: 7.7 % — ABNORMAL HIGH (ref 4.6–6.5)

## 2010-03-17 LAB — BASIC METABOLIC PANEL
BUN: 21 mg/dL (ref 6–23)
CO2: 29 mEq/L (ref 19–32)
Calcium: 9.4 mg/dL (ref 8.4–10.5)
Chloride: 101 mEq/L (ref 96–112)
Creatinine, Ser: 0.8 mg/dL (ref 0.4–1.2)
GFR: 73.83 mL/min (ref >60.00)
Glucose, Bld: 136 mg/dL — ABNORMAL HIGH (ref 70–99)
Potassium: 3.9 mEq/L (ref 3.5–5.1)
Sodium: 137 mEq/L (ref 135–145)

## 2010-03-17 LAB — LIPID PANEL
Cholesterol: 177 mg/dL (ref 0–200)
HDL: 36.6 mg/dL — ABNORMAL LOW (ref >39.00)
Total CHOL/HDL Ratio: 5
Triglycerides: 283 mg/dL — ABNORMAL HIGH (ref 0.0–149.0)
VLDL: 56.6 mg/dL — ABNORMAL HIGH (ref 0.0–40.0)

## 2010-03-17 LAB — LDL CHOLESTEROL, DIRECT: Direct LDL: 97.4 mg/dL

## 2010-03-23 ENCOUNTER — Ambulatory Visit (INDEPENDENT_AMBULATORY_CARE_PROVIDER_SITE_OTHER): Payer: Medicare Other | Admitting: Internal Medicine

## 2010-03-23 ENCOUNTER — Encounter: Payer: Self-pay | Admitting: Internal Medicine

## 2010-03-23 DIAGNOSIS — J029 Acute pharyngitis, unspecified: Secondary | ICD-10-CM

## 2010-03-23 NOTE — Assessment & Plan Note (Signed)
Summary: cough /nws   Vital Signs:  Patient profile:   70 year old female Menstrual status:  postmenopausal Height:      66.5 inches Weight:      209 pounds BMI:     33.35 O2 Sat:      97 % on Room air Temp:     99.4 degrees F oral Pulse rate:   81 / minute BP sitting:   144 / 80  (left arm) Cuff size:   large  Vitals Entered By: Zella Ball Ewing CMA Duncan Dull) (March 16, 2010 1:49 PM)  O2 Flow:  Room air CC: Cough, fever, headaches and fatigue/RE   Primary Care Provider:  Etta Grandchild MD  CC:  Cough, fever, and headaches and fatigue/RE.  History of Present Illness: here for acute onset midl to mod ST and fever, iwth HA, general weakness and malaise, and now prod cough as well;  Pt denies CP, worsening sob, doe, wheezing, orthopnea, pnd, worsening LE edema, palps, dizziness or syncope  Pt denies new neuro symptoms such as headache, facial or extremity weakness  Couldnt sleep due to coughing.   Pt denies polydipsia, polyuria, or low sugar symptoms such as shakiness improved with eating.   Overall good compliance with meds, trying to follow low chol diet, wt stable, little excercise however .  CBG's in the AM still 130-140 (no real change from previous, and does not check during the day).  More stress recently for her with dec GYN surgury, and husband with recent surgury as well.   On januvia for about  3 mo per pt - requests a1c and will f/u with Dr Yetta Barre  Problems Prior to Update: 1)  Uti  (ICD-599.0) 2)  Dyspnea  (ICD-786.05) 3)  Hypertriglyceridemia  (ICD-272.1) 4)  Routine General Medical Exam[facility]  (ICD-V70.0) 5)  Family History Diabetes 1st Degree Relative  (ICD-V18.0) 6)  Family History of Colon Ca 1st Degree Relative <60  (ICD-V16.0) 7)  Diabetes Mellitus, Type II  (ICD-250.00) 8)  Osteoarthritis  (ICD-715.90) 9)  Low Back Pain  (ICD-724.2) 10)  Hypertension  (ICD-401.9) 11)  Cervical Cancer, Hx of  (ICD-V10.41)  Medications Prior to Update: 1)  Metoprolol  Succinate 50 Mg Xr24h-Tab (Metoprolol Succinate) .... Take 1 Tablet By Mouth Once A Day 2)  Co Q 10 Caps .... Take 1 Tablet By Mouth Once A Day 3)  Dhea 25 Mg Tabs (Prasterone (Dhea)) .... Take 1 Tablet By Mouth Two Times A Day. 4)  Acai Berry 500 Mg Caps (Acai) .... Take 1 Capsule By Mouth Once A Day. 5)  Glucosamine .... Take 1 Tablet By Mouth Three Times A Day 6)  Lovaza 1 Gm Caps (Omega-3-Acid Ethyl Esters) .... 2 By Mouth Two Times A Day 7)  Losartan Potassium-Hctz 100-25 Mg Tabs (Losartan Potassium-Hctz) .... One By Mouth Once Daily For High Blood Pressure 8)  Freestyle Test  Strp (Glucose Blood) .[name].. 1 Strip Once A Day 9)  Januvia 100 Mg Tabs (Sitagliptin Phosphate) .... One By Mouth Once Daily For Daibetes  Current Medications (verified): 1)  Metoprolol Succinate 50 Mg Xr24h-Tab (Metoprolol Succinate) .... Take 1 Tablet By Mouth Once A Day 2)  Co Q 10 Caps .... Take 1 Tablet By Mouth Once A Day 3)  Dhea 25 Mg Tabs (Prasterone (Dhea)) .... Take 1 Tablet By Mouth Two Times A Day. 4)  Acai Berry 500 Mg Caps (Acai) .... Take 1 Capsule By Mouth Once A Day. 5)  Glucosamine .... Take 1  Tablet By Mouth Three Times A Day 6)  Lovaza 1 Gm Caps (Omega-3-Acid Ethyl Esters) .... 2 By Mouth Two Times A Day 7)  Losartan Potassium-Hctz 100-25 Mg Tabs (Losartan Potassium-Hctz) .... One By Mouth Once Daily For High Blood Pressure 8)  Freestyle Test  Strp (Glucose Blood) .[name].. 1 Strip Once A Day 9)  Januvia 100 Mg Tabs (Sitagliptin Phosphate) .... One By Mouth Once Daily For Daibetes  Allergies (verified): No Known Drug Allergies  Past History:  Past Medical History: Last updated: 01/20/2009 Uterine/Cervical cancer, hx of Hypertension Low back pain Osteoarthritis Diabetes mellitus, type II  Past Surgical History: Last updated: 01/20/2009 Appendectomy Hysterectomy  Social History: Last updated: 01/20/2009 Retired Married Never Smoked Alcohol use-no Drug use-no Regular  exercise-yes  Risk Factors: Alcohol Use: 0 (07/15/2009) Exercise: yes (01/20/2009)  Risk Factors: Smoking Status: never (07/15/2009)  Review of Systems       all otherwise negative per pt -    Physical Exam  General:  alert and overweight-appearing., mild ill  Head:  normocephalic and atraumatic.   Eyes:  vision grossly intact, pupils equal, and pupils round.   Ears:  left tm marked erythyema, right tm ok,  sinus nontender Nose:  nasal dischargemucosal pallor and mucosal edema.   Mouth:  pharyngeal erythema and fair dentition.   Neck:  supple and no masses.   Lungs:  normal respiratory effort and normal breath sounds.   Heart:  normal rate and regular rhythm.   Extremities:  no edema, no erythema    Impression & Recommendations:  Problem # 1:  PHARYNGITIS-ACUTE (ICD-462)  Her updated medication list for this problem includes:    Azithromycin 250 Mg Tabs (Azithromycin) .[name]... 2po qd for 1 day, then 1po qd for 4days, then stop treat as above, f/u any worsening signs or symptoms   Instructed to complete antibiotics and call if not improved in 48 hours.   Problem # 2:  DIABETES MELLITUS, TYPE II (ICD-250.00)  Her updated medication list for this problem includes:    Losartan Potassium-hctz 100-25 Mg Tabs (Losartan potassium-hctz) ..... One by mouth once daily for high blood pressure    Januvia 100 Mg Tabs (Sitagliptin phosphate) ..... One by mouth once daily for daibetes prob improved  - pt requests a1c and will f/u with Dr Yetta Barre; Pt to cont DM diet, excercise, wt control efforts; to check labs today  Orders: TLB-A1C / Hgb A1C (Glycohemoglobin) (83036-A1C) TLB-BMP (Basic Metabolic Panel-BMET) (80048-METABOL) TLB-Lipid Panel (80061-LIPID)  Problem # 3:  HYPERTENSION (ICD-401.9)  Her updated medication list for this problem includes:    Metoprolol Succinate 50 Mg Xr24h-tab (Metoprolol succinate) .[name]... Take 1 tablet by mouth once a day    Losartan Potassium-hctz 100-25 Mg  Tabs (Losartan potassium-hctz) ..... One by mouth once daily for high blood pressure  BP today: 144/80 Prior BP: 126/70 (12/16/2009)  Prior 10 Yr Risk Heart Disease: Not enough information (07/02/2009)  Labs Reviewed: K+: 4.1 (09/30/2009) Creat: : 0.8 (09/30/2009)   Chol: 169 (09/30/2009)   HDL: 33.50 (09/30/2009)   TG: 277.0 (09/30/2009) mild elev today, likely situational, ok to follow, continue same treatment   Complete Medication List: 1)  Metoprolol Succinate 50 Mg Xr24h-tab (Metoprolol succinate) .... Take 1 tablet by mouth once a day 2)  Co Q 10 Caps  .... Take 1 tablet by mouth once a day 3)  Dhea 25 Mg Tabs (Prasterone (dhea)) .... Take 1 tablet by mouth two times a day. 4)  Acai Berry 500 Mg Caps (  Acai) .... Take 1 capsule by mouth once a day. 5)  Glucosamine  .... Take 1 tablet by mouth three times a day 6)  Lovaza 1 Gm Caps (Omega-3-acid ethyl esters) .... 2 by mouth two times a day 7)  Losartan Potassium-hctz 100-25 Mg Tabs (Losartan potassium-hctz) .... One by mouth once daily for high blood pressure 8)  Freestyle Test Strp (Glucose blood) .[name].. 1 strip once a day 9)  Januvia 100 Mg Tabs (Sitagliptin phosphate) .... One by mouth once daily for daibetes 10)  Azithromycin 250 Mg Tabs (Azithromycin) .... 2po qd for 1 day, then 1po qd for 4days, then stop  Patient Instructions: 1)  Please take all new medications as prescribed 2)  Continue all previous medications as before this visit  3)  Please go to the Lab in the basement for your blood and/or urine tests today  4)  Please call the number on the [facility] Card for results of your testing  5)  Please schedule an appointment with your primary doctor as you have planned Prescriptions: AZITHROMYCIN 250 MG TABS (AZITHROMYCIN) 2po qd for 1 day, then 1po qd for 4days, then stop  #6 x 1   Entered and Authorized by:   Corwin Levins MD   Signed by:   Corwin Levins MD on 03/16/2010   Method used:   Print then Give to Patient   RxID:    [id number]    Orders Added: 1)  Est. Patient Level IV [78469] 2)  TLB-A1C / Hgb A1C (Glycohemoglobin) [83036-A1C] 3)  TLB-BMP (Basic Metabolic Panel-BMET) [80048-METABOL] 4)  TLB-Lipid Panel [80061-LIPID]

## 2010-04-01 ENCOUNTER — Ambulatory Visit (INDEPENDENT_AMBULATORY_CARE_PROVIDER_SITE_OTHER)
Admission: RE | Admit: 2010-04-01 | Discharge: 2010-04-01 | Disposition: A | Discharge status: Home / Self Care | Payer: Medicare Other | Source: Ambulatory Visit | Attending: Internal Medicine | Admitting: Internal Medicine

## 2010-04-01 ENCOUNTER — Ambulatory Visit (INDEPENDENT_AMBULATORY_CARE_PROVIDER_SITE_OTHER): Payer: Medicare Other | Admitting: Internal Medicine

## 2010-04-01 ENCOUNTER — Other Ambulatory Visit: Payer: Self-pay | Admitting: Internal Medicine

## 2010-04-01 ENCOUNTER — Encounter: Payer: Self-pay | Admitting: Internal Medicine

## 2010-04-01 ENCOUNTER — Telehealth: Payer: Self-pay | Admitting: Internal Medicine

## 2010-04-01 DIAGNOSIS — I10 Essential (primary) hypertension: Secondary | ICD-10-CM

## 2010-04-01 DIAGNOSIS — E119 Type 2 diabetes mellitus without complications: Secondary | ICD-10-CM

## 2010-04-01 DIAGNOSIS — R059 Cough, unspecified: Secondary | ICD-10-CM

## 2010-04-01 DIAGNOSIS — R05 Cough: Secondary | ICD-10-CM

## 2010-04-01 DIAGNOSIS — J209 Acute bronchitis, unspecified: Secondary | ICD-10-CM

## 2010-04-01 LAB — HM DIABETES FOOT EXAM

## 2010-04-01 NOTE — Assessment & Plan Note (Signed)
Summary: DR Yetta Barre' PT / NO SLOT--COUGH---STC   Vital Signs:  Patient profile:   70 year old female Menstrual status:  postmenopausal Height:      66.5 inches Weight:      210 pounds BMI:     33.51 O2 Sat:      98 % on Room air Temp:     98.7 degrees F oral Pulse rate:   68 / minute BP sitting:   124 / 86  (left arm) Cuff size:   large  Vitals Entered By: Bill Salinas CMA (March 23, 2010 12:07 PM)  O2 Flow:  Room air CC: pt here with c/o cough and congestion/ ab   Primary Care Provider:  Etta Grandchild MD  CC:  pt here with c/o cough and congestion/ ab.  History of Present Illness: Pateint was seen 2/21 by Dr. Jonny Ruiz and diagnosed with pharyngitis and she was treated with Z-pak. She returns due to loss of smelll and taste, sinus congestion and some wheezing. She reports that her right back was very tight and painful , relieved with chinese medicine - suction cups.  She has not had fever in the past several days. She is continuing to cough - she has been taking nyquil x 1 dose. She describes the cough as a tickling cough with paroxysms that follow.   Current Medications (verified): 1)  Metoprolol Succinate 50 Mg Xr24h-Tab (Metoprolol Succinate) .... Take 1 Tablet By Mouth Once A Day 2)  Co Q 10 Caps .... Take 1 Tablet By Mouth Once A Day 3)  Dhea 25 Mg Tabs (Prasterone (Dhea)) .... Take 1 Tablet By Mouth Two Times A Day. 4)  Acai Berry 500 Mg Caps (Acai) .... Take 1 Capsule By Mouth Once A Day. 5)  Glucosamine .... Take 1 Tablet By Mouth Three Times A Day 6)  Lovaza 1 Gm Caps (Omega-3-Acid Ethyl Esters) .... 2 By Mouth Two Times A Day 7)  Losartan Potassium-Hctz 100-25 Mg Tabs (Losartan Potassium-Hctz) .... One By Mouth Once Daily For High Blood Pressure 8)  Freestyle Test  Strp (Glucose Blood) .[name].. 1 Strip Once A Day 9)  Januvia 100 Mg Tabs (Sitagliptin Phosphate) .... One By Mouth Once Daily For Daibetes 10)  Azithromycin 250 Mg Tabs (Azithromycin) .... 2po Qd For 1 Day, Then  1po Qd For 4days, Then Stop  Allergies (verified): No Known Drug Allergies  Past History:  Past Medical History: Last updated: 01/20/2009 Uterine/Cervical cancer, hx of Hypertension Low back pain Osteoarthritis Diabetes mellitus, type II  Past Surgical History: Last updated: 01/20/2009 Appendectomy Hysterectomy FH reviewed for relevance, SH/Risk Factors reviewed for relevance  Review of Systems       The patient complains of prolonged cough.  The patient denies anorexia, fever, hoarseness, chest pain, dyspnea on exertion, headaches, abdominal pain, severe indigestion/heartburn, muscle weakness, difficulty walking, unusual weight change, and abnormal bleeding.    Physical Exam  General:  ovefrweight woman in no distress Head:  no sinus tenderness to percussion Eyes:  pupils equal and pupils round.   Neck:  supple and full ROM.   Lungs:  normal respiratory effort, normal breath sounds, no crackles, and no wheezes.   Heart:  normal rate and regular rhythm.   Neurologic:  alert & oriented X3 and cranial nerves II-XII intact.   Skin:  turgor normal, color normal, no suspicious lesions, and no purpura.   Cervical Nodes:  no anterior cervical adenopathy and no posterior cervical adenopathy.   Psych:  Oriented X3 and  memory intact for recent and remote.     Impression & Recommendations:  Problem # 1:  PHARYNGITIS-ACUTE (ICD-462) No acute findings on physical exam. She still has azithromycin on board.  Plan - tessalon perles for cough          supportive care.  Her updated medication list for this problem includes:    Azithromycin 250 Mg Tabs (Azithromycin) .[name]... 2po qd for 1 day, then 1po qd for 4days, then stop  Complete Medication List: 1)  Metoprolol Succinate 50 Mg Xr24h-tab (Metoprolol succinate) .... Take 1 tablet by mouth once a day 2)  Co Q 10 Caps  .... Take 1 tablet by mouth once a day 3)  Dhea 25 Mg Tabs (Prasterone (dhea)) .... Take 1 tablet by mouth two times  a day. 4)  Acai Berry 500 Mg Caps (Acai) .... Take 1 capsule by mouth once a day. 5)  Glucosamine  .... Take 1 tablet by mouth three times a day 6)  Lovaza 1 Gm Caps (Omega-3-acid ethyl esters) .... 2 by mouth two times a day 7)  Losartan Potassium-hctz 100-25 Mg Tabs (Losartan potassium-hctz) .... One by mouth once daily for high blood pressure 8)  Freestyle Test Strp (Glucose blood) .[name].. 1 strip once a day 9)  Januvia 100 Mg Tabs (Sitagliptin phosphate) .... One by mouth once daily for daibetes 10)  Azithromycin 250 Mg Tabs (Azithromycin) .... 2po qd for 1 day, then 1po qd for 4days, then stop 11)  Benzonatate 100 Mg Caps (Benzonatate) .[name].. 1 by mouth three times a day for tickle cough.  Patient Instructions: 1)  upper respiratory - no sign of bacterial infection at today's exam. You still have antibiotics on  board through march 2. For the cough - Robitussin DM will help a lot; benzonatate perle 100mg  three times a day for the tickle cough. Hydrate and be sure you are geting vitamin C. Ecchinacea may also be very help to your immune system. Prescriptions: BENZONATATE 100 MG CAPS (BENZONATATE) 1 by mouth three times a day for tickle cough.  #30 x 1   Entered and Authorized by:   Jacques Navy MD   Signed by:   Jacques Navy MD on 03/23/2010   Method used:   Electronically to        [facility]. [phone]* (retail)       [address]       [place], [place]  [address]       Ph: [phone]       Fax: [phone]   RxID:   [id number]    Orders Added: 1)  Est. Patient Level III [44010]

## 2010-04-02 ENCOUNTER — Telehealth: Payer: Self-pay | Admitting: Internal Medicine

## 2010-04-05 LAB — COMPREHENSIVE METABOLIC PANEL
ALT: 23 U/L (ref 0–35)
AST: 24 U/L (ref 0–37)
Albumin: 3.7 g/dL (ref 3.5–5.2)
Alkaline Phosphatase: 46 U/L (ref 39–117)
BUN: 16 mg/dL (ref 6–23)
CO2: 27 mEq/L (ref 19–32)
Calcium: 9.3 mg/dL (ref 8.4–10.5)
Chloride: 101 mEq/L (ref 96–112)
Creatinine, Ser: 0.77 mg/dL (ref 0.4–1.2)
GFR calc Af Amer: >60 mL/min (ref >60)
GFR calc non Af Amer: >60 mL/min (ref >60)
Glucose, Bld: 147 mg/dL — ABNORMAL HIGH (ref 70–99)
Potassium: 3.8 mEq/L (ref 3.5–5.1)
Sodium: 136 mEq/L (ref 135–145)
Total Bilirubin: 0.9 mg/dL (ref 0.3–1.2)
Total Protein: 7.5 g/dL (ref 6.0–8.3)

## 2010-04-05 LAB — GLUCOSE, CAPILLARY
Glucose-Capillary: 124 mg/dL — ABNORMAL HIGH (ref 70–99)
Glucose-Capillary: 134 mg/dL — ABNORMAL HIGH (ref 70–99)
Glucose-Capillary: 140 mg/dL — ABNORMAL HIGH (ref 70–99)
Glucose-Capillary: 146 mg/dL — ABNORMAL HIGH (ref 70–99)
Glucose-Capillary: 192 mg/dL — ABNORMAL HIGH (ref 70–99)
Glucose-Capillary: 99 mg/dL (ref 70–99)

## 2010-04-05 LAB — URINE MICROSCOPIC-ADD ON

## 2010-04-05 LAB — CBC
HCT: 35.1 % — ABNORMAL LOW (ref 36.0–46.0)
HCT: 42.2 % (ref 36.0–46.0)
Hemoglobin: 11.6 g/dL — ABNORMAL LOW (ref 12.0–15.0)
Hemoglobin: 14.4 g/dL (ref 12.0–15.0)
MCH: 29.4 pg (ref 26.0–34.0)
MCH: 30.1 pg (ref 26.0–34.0)
MCHC: 33 g/dL (ref 30.0–36.0)
MCHC: 34.1 g/dL (ref 30.0–36.0)
MCV: 88.3 fL (ref 78.0–100.0)
MCV: 89.1 fL (ref 78.0–100.0)
Platelets: 170 10*3/uL (ref 150–400)
Platelets: 198 10*3/uL (ref 150–400)
RBC: 3.94 MIL/uL (ref 3.87–5.11)
RBC: 4.78 MIL/uL (ref 3.87–5.11)
RDW: 12.7 % (ref 11.5–15.5)
RDW: 12.9 % (ref 11.5–15.5)
WBC: 5.6 10*3/uL (ref 4.0–10.5)
WBC: 8.9 10*3/uL (ref 4.0–10.5)

## 2010-04-05 LAB — URINALYSIS, ROUTINE W REFLEX MICROSCOPIC
Bilirubin Urine: NEGATIVE
Glucose, UA: NEGATIVE mg/dL
Hgb urine dipstick: NEGATIVE
Ketones, ur: NEGATIVE mg/dL
Nitrite: NEGATIVE
Protein, ur: NEGATIVE mg/dL
Specific Gravity, Urine: 1.025 (ref 1.005–1.030)
Urobilinogen, UA: 0.2 mg/dL (ref 0.0–1.0)
pH: 6 (ref 5.0–8.0)

## 2010-04-06 NOTE — Assessment & Plan Note (Signed)
Summary: BACK PAIN-COLD SYMPTOMS X 10 DYS--REFUSED ANOTHER MD---STC   Vital Signs:  Patient profile:   70 year old female Menstrual status:  postmenopausal Height:      66.5 inches (168.91 cm) Weight:      211 pounds (95.91 kg) BMI:     33.67 O2 Sat:      96 % on Room air Temp:     98.4 degrees F (36.89 degrees C) oral Pulse rate:   83 / minute Pulse rhythm:   regular Resp:     14 per minute BP sitting:   118 / 76  (left arm) Cuff size:   large  Vitals Entered By: Rock Nephew CMA (April 01, 2010 9:59 AM)  Nutrition Counseling: Patient's BMI is greater than 25 and therefore counseled on weight management options.  O2 Flow:  Room air CC: Pt c/o of Lower Back pain on Right side X10 days/sls,cma Is Patient Diabetic? Yes Pain Assessment Patient in pain? no      Comments Pt has been using suction cup therapy & ibuprofen..Pt also would like to discuss using Tumeric & Asaxath supplements. Pt states BSL have been elevated in AMs.   Primary Care Provider:  Etta Grandchild MD  CC:  Pt c/o of Lower Back pain on Right side X10 days/sls and cma.  History of Present Illness:       This is a 70 year old female who presents with Chest pain.  The symptoms began 2 weeks ago.  On a scale of 1 to 10, the intensity is described as a 3.  The patient reports resting chest pain, but denies exertional chest pain, nausea, vomiting, diaphoresis, shortness of breath, palpitations, dizziness, light headedness, syncope, and indigestion.  The pain is described as intermittent and sharp.  The pain is located in the right posterior chest and the pain does not radiate.  Episodes of chest pain last 10-20 minutes.  The pain is brought on or made worse by any activity and coughing.  The pain is relieved or improved with NSAIDs.    Preventive Screening-Counseling & Management  Alcohol-Tobacco     Alcohol drinks/day: 0     Alcohol Counseling: not indicated; patient does not drink     Smoking Status:  never     Tobacco Counseling: not indicated; no tobacco use  Hep-HIV-STD-Contraception     Hepatitis Risk: no risk noted     HIV Risk: no risk noted     STD Risk: no risk noted     SBE monthly: yes     SBE Education/Counseling: to perform regular SBE      Sexual History:  currently monogamous.        Drug Use:  no.        Blood Transfusions:  no.    Clinical Review Panels:  Prevention   Last Mammogram:  Normal Bilateral (03/12/2008)   Last Pap Smear:  Normal (08/01/2007)   Last Colonoscopy:  DONE (08/05/2009)  Immunizations   Last Tetanus Booster:  Tdap (05/07/2008)   Last Pneumovax:  Pneumovax (05/07/2008)  Lipid Management   Cholesterol:  177 (03/16/2010)   HDL (good cholesterol):  36.60 (03/16/2010)  Diabetes Management   HgBA1C:  7.7 (03/16/2010)   Creatinine:  0.8 (03/16/2010)   Last Foot Exam:  yes (04/01/2010)   Last Pneumovax:  Pneumovax (05/07/2008)  CBC   WBC:  4.3 (07/02/2009)   RBC:  4.68 (07/02/2009)   Hgb:  14.4 (07/02/2009)   Hct:  41.5 (07/02/2009)  Platelets:  206.0 (07/02/2009)   MCV  88.8 (07/02/2009)   MCHC  34.6 (07/02/2009)   RDW  12.9 (07/02/2009)   PMN:  64.7 (07/02/2009)   Lymphs:  26.7 (07/02/2009)   Monos:  6.6 (07/02/2009)   Eosinophils:  1.6 (07/02/2009)   Basophil:  0.4 (07/02/2009)  Complete Metabolic Panel   Glucose:  136 (03/16/2010)   Sodium:  137 (03/16/2010)   Potassium:  3.9 (03/16/2010)   Chloride:  101 (03/16/2010)   CO2:  29 (03/16/2010)   BUN:  21 (03/16/2010)   Creatinine:  0.8 (03/16/2010)   Albumin:  3.6 (07/02/2009)   Total Protein:  6.6 (07/02/2009)   Calcium:  9.4 (03/16/2010)   Total Bili:  1.1 (07/02/2009)   Alk Phos:  42 (07/02/2009)   SGPT (ALT):  22 (07/02/2009)   SGOT (AST):  19 (07/02/2009)   Medications Prior to Update: 1)  Metoprolol Succinate 50 Mg Xr24h-Tab (Metoprolol Succinate) .... Take 1 Tablet By Mouth Once A Day 2)  Co Q 10 Caps .... Take 1 Tablet By Mouth Once A Day 3)  Dhea 25 Mg  Tabs (Prasterone (Dhea)) .... Take 1 Tablet By Mouth Two Times A Day. 4)  Acai Berry 500 Mg Caps (Acai) .... Take 1 Capsule By Mouth Once A Day. 5)  Glucosamine .... Take 1 Tablet By Mouth Three Times A Day 6)  Lovaza 1 Gm Caps (Omega-3-Acid Ethyl Esters) .... 2 By Mouth Two Times A Day 7)  Losartan Potassium-Hctz 100-25 Mg Tabs (Losartan Potassium-Hctz) .... One By Mouth Once Daily For High Blood Pressure 8)  Freestyle Test  Strp (Glucose Blood) .[name].. 1 Strip Once A Day 9)  Januvia 100 Mg Tabs (Sitagliptin Phosphate) .... One By Mouth Once Daily For Daibetes 10)  Azithromycin 250 Mg Tabs (Azithromycin) .... 2po Qd For 1 Day, Then 1po Qd For 4days, Then Stop 11)  Benzonatate 100 Mg Caps (Benzonatate) .[name].. 1 By Mouth Three Times A Day For Tickle Cough.  Current Medications (verified): 1)  Metoprolol Succinate 50 Mg Xr24h-Tab (Metoprolol Succinate) .... Take 1 Tablet By Mouth Once A Day 2)  Co Q 10 Caps .... Take 1 Tablet By Mouth Once A Day 3)  Dhea 25 Mg Tabs (Prasterone (Dhea)) .... Take 1 Tablet By Mouth Two Times A Day. 4)  Acai Berry 500 Mg Caps (Acai) .... Take 1 Capsule By Mouth Once A Day. 5)  Glucosamine .... Take 1 Tablet By Mouth Three Times A Day 6)  Lovaza 1 Gm Caps (Omega-3-Acid Ethyl Esters) .... 2 By Mouth Two Times A Day 7)  Losartan Potassium-Hctz 100-25 Mg Tabs (Losartan Potassium-Hctz) .... One By Mouth Once Daily For High Blood Pressure 8)  Freestyle Test  Strp (Glucose Blood) .[name].. 1 Strip Once A Day 9)  Echinacea Caps (Echinacea) .... 2 Tabs Two Times A Day 10)  Ibuprofen 200 Mg Tabs (Ibuprofen) .[name].. 1 Tab Q6 Hrs As Needed For Pain 11)  Janumet 50-500 Mg Tabs (Sitagliptin-Metformin Hcl) .... One By Mouth Two Times A Day For Diabetes 12)  Zutripro 60-4-5 Mg/72ml Soln (Pseudoeph-Chlorphen-Hydrocod) .... 5 Ml By Mouth Qid As Needed For Cough  Allergies (verified): No Known Drug Allergies  Past History:  Past Medical History: Last updated: 01/20/2009 Uterine/Cervical  cancer, hx of Hypertension Low back pain Osteoarthritis Diabetes mellitus, type II  Past Surgical History: Last updated: 01/20/2009 Appendectomy Hysterectomy  Family History: Last updated: 01/20/2009 Family History of Arthritis Family History of Colon CA 1st degree relative <60 Family History Diabetes 1st degree relative  Family History High cholesterol Family History Hypertension  Social History: Last updated: 01/20/2009 Retired Married Never Smoked Alcohol use-no Drug use-no Regular exercise-yes  Risk Factors: Alcohol Use: 0 (04/01/2010) Exercise: yes (01/20/2009)  Risk Factors: Smoking Status: never (04/01/2010)  Family History: Reviewed history from 01/20/2009 and no changes required. Family History of Arthritis Family History of Colon CA 1st degree relative <60 Family History Diabetes 1st degree relative Family History High cholesterol Family History Hypertension  Social History: Reviewed history from 01/20/2009 and no changes required. Retired Married Never Smoked Alcohol use-no Drug use-no Regular exercise-yes  Review of Systems       The patient complains of chest pain and prolonged cough.  The patient denies anorexia, fever, weight loss, weight gain, syncope, dyspnea on exertion, peripheral edema, headaches, hemoptysis, abdominal pain, hematuria, suspicious skin lesions, difficulty walking, depression, abnormal bleeding, enlarged lymph nodes, angioedema, and breast masses.   Resp:  Complains of chest discomfort and cough; denies chest pain with inspiration, coughing up blood, excessive snoring, hypersomnolence, morning headaches, pleuritic, shortness of breath, sputum productive, and wheezing. Endo:  Complains of excessive thirst and polyuria; denies cold intolerance, excessive hunger, excessive urination, heat intolerance, and weight change.  Physical Exam  General:  alert, well-developed, well-nourished, well-hydrated, appropriate dress, normal  appearance, healthy-appearing, cooperative to examination, and good hygiene.   Head:  no sinus tenderness to percussion Eyes:  pupils equal and pupils round.   Ears:  R ear normal and L ear normal.   Mouth:  pharyngeal erythema and fair dentition.   Neck:  supple and full ROM.   Chest Wall:  she has coin and suction contusions over her right posterior/lateral back but no skin lesions Lungs:  normal respiratory effort, no intercostal retractions, no accessory muscle use, normal breath sounds, no dullness, no fremitus, no crackles, and no wheezes.   Heart:  normal rate, regular rhythm, no murmur, no gallop, no rub, and no JVD.   Abdomen:  Bowel sounds positive,abdomen soft and non-tender without masses, organomegaly or hernias noted. Msk:  No deformity or scoliosis noted of thoracic or lumbar spine.   Pulses:  R and L carotid,radial,femoral,dorsalis pedis and posterior tibial pulses are full and equal bilaterally Extremities:  No clubbing, cyanosis, edema, or deformity noted with normal full range of motion of all joints.   Neurologic:  No cranial nerve deficits noted. Station and gait are normal. Plantar reflexes are down-going bilaterally. DTRs are symmetrical throughout. Sensory, motor and coordinative functions appear intact. Skin:  Intact without suspicious lesions or rashes Cervical Nodes:  no anterior cervical adenopathy and no posterior cervical adenopathy.   Axillary Nodes:  no R axillary adenopathy and no L axillary adenopathy.   Psych:  Cognition and judgment appear intact. Alert and cooperative with normal attention span and concentration. No apparent delusions, illusions, hallucinations  Diabetes Management Exam:    Foot Exam (with socks and/or shoes not present):       Sensory-Pinprick/Light touch:          Left medial foot (L-4): normal          Left dorsal foot (L-5): normal          Left lateral foot (S-1): normal          Right medial foot (L-4): normal          Right dorsal  foot (L-5): normal          Right lateral foot (S-1): normal       Sensory-Monofilament:  Left foot: normal          Right foot: normal       Inspection:          Left foot: normal          Right foot: normal       Nails:          Left foot: normal          Right foot: normal   Impression & Recommendations:  Problem # 1:  BRONCHITIS-ACUTE (ICD-466.0) Assessment New  The following medications were removed from the medication list:    Azithromycin 250 Mg Tabs (Azithromycin) .[name]... 2po qd for 1 day, then 1po qd for 4days, then stop    Benzonatate 100 Mg Caps (Benzonatate) .[name]... 1 by mouth three times a day for tickle cough. Her updated medication list for this problem includes:    Zutripro 60-4-5 Mg/89ml Soln (Pseudoeph-chlorphen-hydrocod) .Marland KitchenMarland KitchenMarland KitchenMarland Kitchen 5 ml by mouth qid as needed for cough  Take antibiotics and other medications as directed. Encouraged to push clear liquids, get enough rest, and take acetaminophen as needed. To be seen in 5-7 days if no improvement, sooner if worse.  Problem # 2:  COUGH (ICD-786.2) Assessment: New will check for pna, masses, edema, etc. Orders: T-2 View CXR (71020TC)  Problem # 3:  DIABETES MELLITUS, TYPE II (ICD-250.00) Assessment: Deteriorated  The following medications were removed from the medication list:    Januvia 100 Mg Tabs (Sitagliptin phosphate) ..... One by mouth once daily for daibetes Her updated medication list for this problem includes:    Losartan Potassium-hctz 100-25 Mg Tabs (Losartan potassium-hctz) ..... One by mouth once daily for high blood pressure    Janumet 50-500 Mg Tabs (Sitagliptin-metformin hcl) ..... One by mouth two times a day for diabetes  Labs Reviewed: Creat: 0.8 (03/16/2010)    Reviewed HgBA1c results: 7.7 (03/16/2010)  8.0 (09/30/2009)  Problem # 4:  HYPERTENSION (ICD-401.9) Assessment: Improved  Her updated medication list for this problem includes:    Metoprolol Succinate 50 Mg Xr24h-tab  (Metoprolol succinate) .[name]... Take 1 tablet by mouth once a day    Losartan Potassium-hctz 100-25 Mg Tabs (Losartan potassium-hctz) ..... One by mouth once daily for high blood pressure  BP today: 118/76 Prior BP: 124/86 (03/23/2010)  Prior 10 Yr Risk Heart Disease: Not enough information (07/02/2009)  Labs Reviewed: K+: 3.9 (03/16/2010) Creat: : 0.8 (03/16/2010)   Chol: 177 (03/16/2010)   HDL: 36.60 (03/16/2010)   TG: 283.0 (03/16/2010)  Complete Medication List: 1)  Metoprolol Succinate 50 Mg Xr24h-tab (Metoprolol succinate) .... Take 1 tablet by mouth once a day 2)  Co Q 10 Caps  .... Take 1 tablet by mouth once a day 3)  Dhea 25 Mg Tabs (Prasterone (dhea)) .... Take 1 tablet by mouth two times a day. 4)  Acai Berry 500 Mg Caps (Acai) .... Take 1 capsule by mouth once a day. 5)  Glucosamine  .... Take 1 tablet by mouth three times a day 6)  Lovaza 1 Gm Caps (Omega-3-acid ethyl esters) .... 2 by mouth two times a day 7)  Losartan Potassium-hctz 100-25 Mg Tabs (Losartan potassium-hctz) .... One by mouth once daily for high blood pressure 8)  Freestyle Test Strp (Glucose blood) .[name].. 1 strip once a day 9)  Echinacea Caps (echinacea)  .... 2 tabs two times a day 10)  Ibuprofen 200 Mg Tabs (Ibuprofen) .[name].. 1 tab q6 hrs as needed for pain 11)  Janumet 50-500 Mg Tabs (Sitagliptin-metformin hcl) .Marland KitchenMarland KitchenMarland Kitchen  One by mouth two times a day for diabetes 12)  Zutripro 60-4-5 Mg/48ml Soln (Pseudoeph-chlorphen-hydrocod) .... 5 ml by mouth qid as needed for cough  Patient Instructions: 1)  Please schedule a follow-up appointment in 2 weeks. 2)  It is important that you exercise regularly at least 20 minutes 5 times a week. If you develop chest pain, have severe difficulty breathing, or feel very tired , stop exercising immediately and seek medical attention. 3)  You need to lose weight. Consider a lower calorie diet and regular exercise.  4)  Check your blood sugars regularly. If your readings are usually  above 200 or below 70 you should contact our office. 5)  It is important that your Diabetic A1c level is checked every 3 months. 6)  See your eye doctor yearly to check for diabetic eye damage. 7)  Check your feet each night for sore areas, calluses or signs of infection. 8)  Check your Blood Pressure regularly. If it is above 130/80: you should make an appointment. 9)  Acute bronchitis symptoms for less than 10 days are not helped by antibiotics. take over the counter cough medications. call if no improvment in  5-7 days, sooner if increasing cough, fever, or new symptoms( shortness of breath, chest pain). Prescriptions: ZUTRIPRO 60-4-5 MG/5ML SOLN (PSEUDOEPH-CHLORPHEN-HYDROCOD) 5 ml by mouth QID as needed for cough  #60 ml x 0   Entered and Authorized by:   Etta Grandchild MD   Signed by:   Etta Grandchild MD on 04/01/2010   Method used:   Samples Given   RxID:   [id number] JANUMET 50-500 MG TABS (SITAGLIPTIN-METFORMIN HCL) One by mouth two times a day for diabetes  #164 x 0   Entered and Authorized by:   Etta Grandchild MD   Signed by:   Etta Grandchild MD on 04/01/2010   Method used:   Samples Given   RxID:   [id number]    Orders Added: 1)  T-2 View CXR [71020TC] 2)  Est. Patient Level IV [84696]

## 2010-04-06 NOTE — Progress Notes (Signed)
Summary: Results request  Phone Note Call from Patient Call back at Home Phone [phone]   Summary of Call: Pt called requesting results of Chest Xray done 04/01/10. Initial call taken by: Burnard Leigh [facility]),  April 02, 2010 3:28 PM  Follow-up for Phone Call        normal Follow-up by: Etta Grandchild MD,  April 02, 2010 3:44 PM  Additional Follow-up for Phone Call Additional follow up Details #1::        Informed Pt.

## 2010-04-06 NOTE — Progress Notes (Signed)
  Phone Note From Pharmacy   Caller: [facility]. #[phone]* Summary of Call: Per pharmacy, pt stated that she was instructed to test more that once daily. Please advise  how often you want her to test. Thanks Initial call taken by: Rock Nephew CMA,  April 01, 2010 3:03 PM  Follow-up for Phone Call        she is not on insulin so I believe medicare will only pay for once daily and that is fine with me Follow-up by: Etta Grandchild MD,  April 01, 2010 3:08 PM  Additional Follow-up for Phone Call Additional follow up Details #1::        Pharmacy notified via fax.Marland KitchenMarland KitchenAlvy Beal Archie CMA  April 01, 2010 3:52 PM

## 2010-04-15 ENCOUNTER — Ambulatory Visit (INDEPENDENT_AMBULATORY_CARE_PROVIDER_SITE_OTHER): Payer: Medicare Other | Admitting: Internal Medicine

## 2010-04-15 ENCOUNTER — Encounter: Payer: Self-pay | Admitting: Internal Medicine

## 2010-04-15 DIAGNOSIS — I10 Essential (primary) hypertension: Secondary | ICD-10-CM

## 2010-04-15 DIAGNOSIS — E119 Type 2 diabetes mellitus without complications: Secondary | ICD-10-CM

## 2010-04-15 NOTE — Progress Notes (Signed)
Subjective:    Patient ID: Nicole [name] Overall, female    DOB: August 12, 1940, 70 y.o.   MRN: [id number]  Diabetes She presents for her follow-up diabetic visit. She has type 2 diabetes mellitus. MedicAlert identification noted. The initial diagnosis of diabetes was made 9 months ago. Her disease course has been improving. There are no hypoglycemic associated symptoms. Pertinent negatives for hypoglycemia include no confusion, dizziness, headaches, hunger, mood changes, nervousness/anxiousness, pallor or seizures. Pertinent negatives for diabetes include no blurred vision, no chest pain, no fatigue, no foot paresthesias, no foot ulcerations, no polydipsia, no polyuria, no visual change, no weakness and no weight loss. There are no hypoglycemic complications. Symptoms are stable. Pertinent negatives for diabetic complications include no autonomic neuropathy, CVA, nephropathy, peripheral neuropathy, PVD or retinopathy. Risk factors for coronary artery disease include diabetes mellitus. Current diabetic treatment includes oral agent (dual therapy). She is compliant with treatment all of the time. Her weight is stable. She is following a diabetic diet. She participates in exercise daily. Her home blood glucose trend is decreasing steadily. Her breakfast blood glucose range is generally 90-110 mg/dl.    Review of Systems  Constitutional: Negative for weight loss, activity change, appetite change and fatigue.  Eyes: Negative for blurred vision.  Respiratory: Negative for cough, shortness of breath, wheezing and stridor.   Cardiovascular: Negative for chest pain.  Genitourinary: Negative for dysuria, polyuria, frequency and hematuria.  Skin: Negative for pallor.  Neurological: Negative for dizziness, seizures, weakness and headaches.  Hematological: Negative for polydipsia.  Psychiatric/Behavioral: Negative for confusion. The patient is not nervous/anxious.        Past Medical History  Diagnosis Date  .  Hypertension   . Diabetes mellitus   . Cancer     uterine/cervical  . Low back pain   . Osteoarthritis    Past Surgical History  Procedure Date  . Abdominal hysterectomy   . Appendectomy     reports that she has never smoked. She does not have any smokeless tobacco history on file. She reports that she does not drink alcohol or use illicit drugs. family history includes Arthritis in her other; Cancer in her other; Diabetes in her other; Hyperlipidemia in her other; and Hypertension in her other. No Known Allergies Lab Results  Component Value Date   CREATININE 0.8 03/16/2010   Lab Results  Component Value Date   HGBA1C 7.7* 03/16/2010   Objective:   Physical Exam  Constitutional: She appears well-developed and well-nourished. No distress.  HENT:  Head: Normocephalic.  Mouth/Throat: No oropharyngeal exudate.  Eyes: Conjunctivae and EOM are normal. Pupils are equal, round, and reactive to light. No scleral icterus.  Neck: Normal range of motion. Neck supple. No thyromegaly present.  Cardiovascular: Normal rate, regular rhythm, normal heart sounds and intact distal pulses.  Exam reveals no gallop and no friction rub.   No murmur heard. Pulmonary/Chest: Effort normal and breath sounds normal. No respiratory distress. She has no wheezes. She has no rales. She exhibits no tenderness.  Abdominal: She exhibits no distension and no mass. There is no tenderness. There is no rebound and no guarding.  Musculoskeletal: She exhibits no edema and no tenderness.  Lymphadenopathy:    She has no cervical adenopathy.  Neurological: She is alert. She has normal reflexes. She displays normal reflexes. No cranial nerve deficit. She exhibits normal muscle tone. Coordination normal.  Skin: Skin is warm and dry. No rash noted. She is not diaphoretic. No erythema. No pallor.  Psychiatric: She has  a normal mood and affect. Judgment and thought content normal.          Assessment & Plan:

## 2010-04-15 NOTE — Patient Instructions (Signed)
Diabetes, Frequently Asked Questions WHAT IS DIABETES? Most of the food we eat is turned into glucose (sugar). Our bodies use it for energy. The pancreas makes a hormone called insulin. It helps glucose get into the cells of our bodies. When you have diabetes, your body either does not make enough insulin or cannot use its own insulin as well as it should. This causes sugars to build up in your blood. WHAT ARE THE SYMPTOMS OF DIABETES?  Frequent urination.   Excessive thirst.   Unexplained weight loss.   Extreme hunger.   Blurred vision.   Tingling or numbness in hands or feet.   Feeling very tired much of the time.   Dry, itchy skin.   Sores that are slow to heal.   Yeast infections.   WHAT ARE THE TYPES OF DIABETES? Type 1 Diabetes   About 10% of affected people have this type.   Usually occurs before the age of 30.   Usually occurs in thin to normal weight people.  Type 2 Diabetes  About 90% of affected people have this type.   Usually occurs after the age of 40.   Usually occurs in overweight people.   More likely to have:   A family history of diabetes.   A history of diabetes during pregnancy (gestational diabetes).   High blood pressure.   High cholesterol and triglycerides.  Gestational Diabetes  Occurs in about 4% of pregnancies.   Usually goes away after the baby is born.   More likely to occur in women with:   Family history of diabetes.   Previous gestational diabetes.   Obese.   Over 25 years old.  WHAT IS PRE-DIABETES? Pre-diabetes means your blood glucose is higher than normal, but lower than the diabetes range. It also means you are at risk of getting type 2 diabetes and heart disease. If you are told you have pre-diabetes, have your blood glucose checked again in 1 to 2 years. WHAT IS THE TREATMENT FOR DIABETES? Treatment is aimed at keeping blood glucose near normal levels at all times. Learning how to manage this yourself is  important in treating diabetes. Depending on the type of diabetes you have, your treatment will include one or more of the following:  Monitoring your blood glucose.   Meal planning.   Exercise.   Oral medicine (pills) or insulin.  CAN DIABETES BE PREVENTED? With type 1 diabetes, prevention is more difficult, because the triggers that cause it are not yet known. With type 2 diabetes, prevention is more likely, with lifestyle changes:  Maintain a healthy weight.   Eat healthy.   Exercise.  IS THERE A CURE FOR DIABETES? No, there is no cure for diabetes. There is a lot of research going on that is looking for a cure, and progress is being made. Diabetes can be treated and controlled. People with diabetes can manage their diabetes and lead normal, active lives. SHOULD I BE TESTED FOR DIABETES? If you are at least 68 years old, you should be tested for diabetes. You should be tested again every 3 years. If you are 45 or older and overweight, you may want to get tested more often. If you are younger than 45, overweight, and have one or more of the following risk factors, you should be tested:  Family history of diabetes.   Inactive lifestyle.   High blood pressure.  WHAT ARE SOME OTHER SOURCES FOR INFORMATION ON DIABETES? The following organizations may help in your search   for more information on diabetes: National Diabetes Education Program (NDEP) Internet: http://www.ndep.nih.gov/resources Telephone toll free: 1-888-693-6337 American Diabetes Association Telephone: 703-549-1500 To order publications toll free: 1-800-ADA-ORDER  Diabetes information: 1-800-342-2383 (800-DIABETES)  Internet: http://www.diabetes.org  Juvenile [employer] Telephone: 1-800-533-2873 Internet: http://www.jdf.org Document Released: 01/13/2003 Document Re-Released: 12/29/2008 [employer] Patient Information 2011 [employer]. 

## 2010-06-11 NOTE — Discharge Summary (Signed)
[name], Nicole [name]                          ACCOUNT NO.:  [id number]   MEDICAL RECORD NO.:  [id number]                   PATIENT TYPE:  INP   LOCATION:  0451                                 FACILITY:  [facility]   PHYSICIAN:  Gerrit Friends. Aldona Bar, M.D.                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   PROCEDURES:  Total abdominal hysterectomy, bilateral salpingo-oophorectomy,  and pelvic lymphadenectomy.   HISTORY:  This 70 year old patient was sent to me by Dr. Luanna Salk for  evaluation of postmenopausal bleeding.  When seen in the office, endometrial  biopsy was done which returned as revealing adenocarcinoma of the  endometrium.  She was sent for consultation to see Dr. Serita Kyle  and decision was made to proceed with surgery which would include a total  abdominal hysterectomy, bilateral salpingo-oophorectomy, and possible pelvic  lymphadenectomy.  After normal preoperative workup, she was taken to the  operating room on the morning of December 24, 2002, at which time she  underwent a total abdominal hysterectomy, bilateral salpingo-oophorectomy,  and pelvic lymphadenectomy with Dr. Stanford Breed as the Surgeon and myself  as the Assistant.  Frozen section suggested no residual tumor and final  pathology likewise confirmed no residual tumor but there was focal atypical  complex hyperplasia noted in the remaining endometrium.   All lymph nodes were negative.  The patient's postoperative course was  totally uncomplicated.  Her discharge hemoglobin was 12.2 with a white count  of 7800 and a platelet count of 208,000.  A CT of the lungs was done on  postop day one because of a suggestion of a shadow on her preoperative chest  x-ray, and this likewise within  normal limits.   On the morning of December 26, 2002, the patient was ambulating well and  tolerating a regular diet well, having normal bowel and bladder function,  was afebrile, her wound was clean and dry, and she was desirous of  discharge.  Accordingly, she was given all appropriate instructions and  understood all instructions well.   DISCHARGE MEDICATIONS:  1. Motrin 600 mg every six hours as needed for pain.  2. Tylox one to two every four to six hours as needed for severe pain.  3. Over the counter medications for gas and constipation as needed.   She was given all explicit instructions at the time of discharge and  understood all instructions well.  At the time of discharge her wound was  clean and dry, and her staples were left in.  She will return to see Telford Nab, R.N.,  at the [facility] on December 30, 2002, for suture removal.  She  will return  to see me in the office in approximately four weeks time or  sooner as needed.   CONDITION ON DISCHARGE:  Improved.                                               Gerrit Friends. Aldona Bar, M.D.    RMW/MEDQ  D:  12/26/2002  T:  12/26/2002  Job:  [id number]   cc:   De Blanch, M.D.   Quita Skye Artis Flock, M.D.  [address], [address]  [place]  [place] [address]  Fax: [phone]

## 2010-06-11 NOTE — Op Note (Signed)
[name], [name]                          ACCOUNT NO.:  [id number]   MEDICAL RECORD NO.:  [id number]                   PATIENT TYPE:  INP   LOCATION:  0001                                 FACILITY:  [facility]   PHYSICIAN:  De Blanch, M.D.         DATE OF BIRTH:  Aug 01, 1940   DATE OF PROCEDURE:  12/24/2002  DATE OF DISCHARGE:                                 OPERATIVE REPORT   PREOPERATIVE DIAGNOSIS:  Grade 1 endometrial adenocarcinoma.   POSTOPERATIVE DIAGNOSES:  1. Grade 1 endometrial adenocarcinoma.  2. Uterine fibroids.   PROCEDURES:  1. Total abdominal hysterectomy.  2. Bilateral salpingo-oophorectomy.  3. Pelvic lymphadenectomy.   SURGEON:  De Blanch, M.D.   ASSISTANTS:  1. Gerrit Friends. Aldona Bar, M.D.  2. Telford Nab, R.N.   ANESTHESIA:  General with orotracheal tube.   ESTIMATED BLOOD LOSS:  200 mL.   SURGICAL FINDINGS:  At the time of exploratory laparotomy, it was found that  the patient had adhesions to her prior peritoneal incision for an  appendectomy.  The upper abdomen, including the diaphragm, spleen, stomach,  omentum, small and large bowel, and periaortic and pelvic lymph nodes, were  all normal with no evidence of metastatic disease.  The uterus was irregular  with multiple small fibroids measuring approximately 2-3 cm in diameter.  On  frozen section, there was some polypoid evidence of endometrial carcinoma,  but no evidence of myometrial invasion.  The tubes and ovaries appeared  normal.   DESCRIPTION OF PROCEDURE:  The patient was brought to the operating room and  after satisfactory attainment of general anesthesia was placed in a modified  lithotomy position in [place] stirrups.  The anterior abdominal wall,  perineum, and vagina were prepped with Betadine.  A Foley catheter was  placed and the patient was draped.  The abdomen was entered through a low  midline incision.  Perineal washings were obtained.  The abdomen and  pelvis  were explored with the above-noted findings.  Omental adhesions to the  anterior abdominal wall and subumbilical region were freed with Bovie  cautery.  A __________ retractor was positioned and the small bowel and  sigmoid colon packed out of the pelvis.  The uterus was grasped with long  Kelly clamps.  The round ligaments were divided and the retroperitoneal  space was opened.  Paravesical and pararectal spaces were identified and the  ureter identified.  The ovarian vessels were skeletonized, clamped, free  tied, and suture ligated.  The bladder flap was advanced to sharp and blunt  dissection.  The uterine vessels were skeletonized, clamped, cut, and suture  ligated in a stepwise fashion.  The pericervical and cardinal ligaments were  clamped, cut, and suture ligated.  The rectovaginal septum was opened and  the uterosacral ligaments and vaginal angles were then clamped, cut,  transected, and suture ligated.  The vagina was transected from its  connection to the cervix.  The vaginal angles  were transfixed with 0 Vicryl  and the central portion of the vagina closed with interrupted figure-of-  eight sutures of 0 Vicryl, incorporating the peritoneal incision of the  rectovaginal septum along with the vaginal mucosa.  Hemostasis was good.   Attention was turned to pelvic lymphadenectomy.  In a systemic fashion, the  lymph nodes were excised from the external iliac artery and vein with care  taken to avoid injury to the circumflex iliac vein.  A vein retractor was  used to elevated the external iliac vein and the obturator space entered.  Dissection was carried along the psoas muscle and along the obturator  internus muscle.  Care was taken to avoid injury to an accessory obturator  vein.  The obturator nerve was identified and protected and dissected away  from the nodal specimen.  Hemostasis was achieved using hemoclips through  the dissection.  Hot packs were placed in the  pelvis and a similar procedure  was performed on the opposite side of the pelvis.  At this juncture frozen  section returned that the patient had no evidence of invasion, therefore it  was felt that a periaortic lymphadenectomy was not necessary given the  patient's good prognosis and very low probability of having periaortic lymph  node metastases.   The pelvis was irrigated with copious amounts of warm saline.  The packs and  retractors were removed.  The anterior abdominal wall was closed in layers,  the first being a running mess closure using #1 PDS.  The subcutaneous  tissues were irrigated and hemostasis achieved with cautery.  The skin was  closed with skin staples and a dressing was applied.  The patient was awaken  from anesthesia and taken to the recovery room in satisfactory condition.  The sponge, needle, and instrument counts were correct x 2.                                               De Blanch, M.D.    DC/MEDQ  D:  12/24/2002  T:  12/24/2002  Job:  [id number]   cc:   Telford Nab, R.N.  [address]  [place], [place] [address]   Quita Skye. Artis Flock, M.D.  [address]  [place]  [place] [address]  Fax: [phone]

## 2010-06-11 NOTE — Consult Note (Signed)
Nicole [name], Nicole [name]                          ACCOUNT NO.:  [id number]   MEDICAL RECORD NO.:  [id number]                   PATIENT TYPE:  INP   LOCATION:  NA                                   FACILITY:  [facility]   PHYSICIAN:  De Blanch, M.D.         DATE OF BIRTH:  11/21/1940   DATE OF CONSULTATION:  12/18/2002  DATE OF DISCHARGE:                                   CONSULTATION   This is a 70 year old Asian female seen in consultation at the request of  Dr. Annamaria Helling regarding newly diagnosed endometrial cancer.   The patient presented to Dr. Aldona Bar with abnormal uterine bleeding. An  endometrial biopsy was obtained showing a well-differentiated endometrial  adenocarcinoma. The patient has not been on any hormone replacement therapy.  At the time of her endometrial biopsy her uterus sounded to 10 cm. She has  subsequently had a CAT scan which was negative.   PAST MEDICAL HISTORY:  Medical illnesses:  None. Gravida 3.   PAST SURGICAL HISTORY:  Appendectomy and bilateral tubal ligation.   DRUG ALLERGIES:  None.   CURRENT MEDICATIONS:  Vitamins, calcium, and glucosamine.   SOCIAL HISTORY:  The patient is married. She does not smoke. 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   FAMILY HISTORY:  Negative for gynecologic, breast, or colonic cancer.   REVIEW OF SYSTEMS:  Negative except as noted above.   PHYSICAL EXAMINATION:  VITAL SIGNS: Weight 221 pounds, height 5 feet 8  inches, blood pressure 160/88, pulse 100.  GENERAL: The patient is a healthy Asian female in no acute distress.  HEENT:  Negative.  NECK: Supple without thyromegaly. There is no supraclavicular or inguinal  adenopathy.  ABDOMEN: Obese, soft, and nontender. No masses, organomegaly, ascites, or  hernia is noted. She does have a well-healed large appendectomy scar and a  subumbilical  incision.  PELVIC EXAM: EGBUS, vagina, bladder, and urethra are normal. The cervix is  normal with some blood at the os. The uterus is slightly enlarged and  slightly difficult evaluate because of the patient's obesity. There are no  adnexal masses. Rectovaginal exam confirms there is no cul-de-sac  nodularity.   IMPRESSION:  Grade 1 endometrial carcinoma in an obese Asian female.   PLAN:  I would recommend the patient undergo a total abdominal hysterectomy  with bilateral salpingo-oophorectomy and possible pelvic and periaortic  lymphadenectomy depending upon findings on intraoperative frozen section.  Surgical procedure, its risks, and potential complications were outlined to  the patient. She understands that the risks include hemorrhage, infection,  injury to adjacent viscera, thromboembolic complications, and anesthetic  risks. In addition, she is aware that postoperatively she may require  radiation therapy or chemotherapy depending upon our surgical findings and  prognostic features. She is in agreement with this plan. We will schedule  surgery  in conjunction with Dr. Annamaria Helling on December 24, 2002.   She will undergo preoperative evaluation later this week.                                               De Blanch, M.D.    DC/MEDQ  D:  12/18/2002  T:  12/18/2002  Job:  [id number]   cc:   Gerrit Friends. Aldona Bar, M.D.  [address]  [place]  [place] [address]  Fax: [phone]   Quita Skye. Artis Flock, M.D.  [address], [address]  [place]  [place] [address]  Fax: [phone]   Telford Nab, R.N.  [phone] [address]  [place], [place] [address]

## 2010-07-19 ENCOUNTER — Telehealth: Payer: Self-pay

## 2010-07-19 MED ORDER — SITAGLIPTIN PHOS-METFORMIN HCL 50-500 MG PO TABS: 1.0000 | ORAL_TABLET | Freq: Two times a day (BID) | ORAL | Status: DC

## 2010-07-19 NOTE — Telephone Encounter (Signed)
Patient called [facility] requesting refill on Janumet to be sent to [facility]

## 2010-08-03 ENCOUNTER — Other Ambulatory Visit: Payer: Self-pay | Admitting: Internal Medicine

## 2010-08-05 ENCOUNTER — Encounter: Payer: Self-pay | Admitting: Internal Medicine

## 2010-08-06 ENCOUNTER — Encounter: Payer: Self-pay | Admitting: Internal Medicine

## 2010-08-06 ENCOUNTER — Ambulatory Visit (INDEPENDENT_AMBULATORY_CARE_PROVIDER_SITE_OTHER): Payer: Medicare Other | Admitting: Internal Medicine

## 2010-08-06 ENCOUNTER — Other Ambulatory Visit (INDEPENDENT_AMBULATORY_CARE_PROVIDER_SITE_OTHER): Payer: Medicare Other

## 2010-08-06 DIAGNOSIS — M545 Low back pain, unspecified: Secondary | ICD-10-CM

## 2010-08-06 DIAGNOSIS — I10 Essential (primary) hypertension: Secondary | ICD-10-CM

## 2010-08-06 DIAGNOSIS — E119 Type 2 diabetes mellitus without complications: Secondary | ICD-10-CM

## 2010-08-06 DIAGNOSIS — E781 Pure hyperglyceridemia: Secondary | ICD-10-CM

## 2010-08-06 LAB — COMPREHENSIVE METABOLIC PANEL
ALT: 27 U/L (ref 0–35)
AST: 24 U/L (ref 0–37)
Albumin: 4.1 g/dL (ref 3.5–5.2)
Alkaline Phosphatase: 49 U/L (ref 39–117)
BUN: 21 mg/dL (ref 6–23)
CO2: 29 mEq/L (ref 19–32)
Calcium: 9 mg/dL (ref 8.4–10.5)
Chloride: 105 mEq/L (ref 96–112)
Creatinine, Ser: 0.8 mg/dL (ref 0.4–1.2)
GFR: 79.3 mL/min (ref >60.00)
Glucose, Bld: 145 mg/dL — ABNORMAL HIGH (ref 70–99)
Potassium: 4 mEq/L (ref 3.5–5.1)
Sodium: 140 mEq/L (ref 135–145)
Total Bilirubin: 0.9 mg/dL (ref 0.3–1.2)
Total Protein: 7.7 g/dL (ref 6.0–8.3)

## 2010-08-06 LAB — CBC WITH DIFFERENTIAL/PLATELET
Basophils Absolute: 0 10*3/uL (ref 0.0–0.1)
Basophils Relative: 0.3 % (ref 0.0–3.0)
Eosinophils Absolute: 0.1 10*3/uL (ref 0.0–0.7)
Eosinophils Relative: 2.2 % (ref 0.0–5.0)
HCT: 41.4 % (ref 36.0–46.0)
Hemoglobin: 14.3 g/dL (ref 12.0–15.0)
Lymphocytes Relative: 22.8 % (ref 12.0–46.0)
Lymphs Abs: 1.1 10*3/uL (ref 0.7–4.0)
MCHC: 34.5 g/dL (ref 30.0–36.0)
MCV: 88.3 fl (ref 78.0–100.0)
Monocytes Absolute: 0.4 10*3/uL (ref 0.1–1.0)
Monocytes Relative: 7.8 % (ref 3.0–12.0)
Neutro Abs: 3.2 10*3/uL (ref 1.4–7.7)
Neutrophils Relative %: 66.9 % (ref 43.0–77.0)
Platelets: 213 10*3/uL (ref 150.0–400.0)
RBC: 4.69 Mil/uL (ref 3.87–5.11)
RDW: 13.5 % (ref 11.5–14.6)
WBC: 4.7 10*3/uL (ref 4.5–10.5)

## 2010-08-06 LAB — TSH: TSH: 1.11 u[IU]/mL (ref 0.35–5.50)

## 2010-08-06 LAB — LIPID PANEL
Cholesterol: 166 mg/dL (ref 0–200)
HDL: 43 mg/dL (ref >39.00)
LDL Cholesterol: 85 mg/dL (ref 0–99)
Total CHOL/HDL Ratio: 4
Triglycerides: 190 mg/dL — ABNORMAL HIGH (ref 0.0–149.0)
VLDL: 38 mg/dL (ref 0.0–40.0)

## 2010-08-06 LAB — HEMOGLOBIN A1C: Hgb A1c MFr Bld: 7.5 % — ABNORMAL HIGH (ref 4.6–6.5)

## 2010-08-06 NOTE — Assessment & Plan Note (Signed)
I will check her A1C and monitor her renal function

## 2010-08-06 NOTE — Assessment & Plan Note (Signed)
No change, no evidence of radiculopathy, she does not want to do any further testing

## 2010-08-06 NOTE — Assessment & Plan Note (Signed)
BP is well controlled 

## 2010-08-06 NOTE — Assessment & Plan Note (Signed)
Recheck her FLP today 

## 2010-08-06 NOTE — Progress Notes (Signed)
Subjective:    Patient ID: Nicole [name] Overall, female    DOB: 1940-09-26, 70 y.o.   MRN: [id number]  Diabetes She presents for her follow-up diabetic visit. She has type 2 diabetes mellitus. Her disease course has been fluctuating. There are no hypoglycemic associated symptoms. Pertinent negatives for hypoglycemia include no confusion, dizziness, headaches, nervousness/anxiousness, pallor, seizures, speech difficulty, sweats or tremors. There are no diabetic associated symptoms. Pertinent negatives for diabetes include no blurred vision, no chest pain, no fatigue and no weakness. There are no hypoglycemic complications. Symptoms are stable. There are no diabetic complications. Current diabetic treatment includes oral agent (dual therapy). She is compliant with treatment most of the time. Her weight is stable. She is following a generally healthy diet. Meal planning includes avoidance of concentrated sweets. She never participates in exercise. Her home blood glucose trend is decreasing steadily. Her breakfast blood glucose range is generally 90-110 mg/dl. Her lunch blood glucose range is generally 110-130 mg/dl. Her dinner blood glucose range is generally 110-130 mg/dl. Her highest blood glucose is 140-180 mg/dl. Her overall blood glucose range is 110-130 mg/dl. An ACE inhibitor/angiotensin II receptor blocker is being taken. She does not see a podiatrist.Eye exam is current.  Hypertension This is a chronic problem. The current episode started more than 1 year ago. The problem has been gradually improving since onset. The problem is controlled. Pertinent negatives include no anxiety, blurred vision, chest pain, headaches, malaise/fatigue, neck pain, orthopnea, palpitations, peripheral edema, PND, shortness of breath or sweats. There are no associated agents to hypertension. Past treatments include angiotensin blockers and diuretics. The current treatment provides significant improvement. Compliance problems  include exercise and diet.       Review of Systems  Constitutional: Negative for fever, chills, malaise/fatigue, diaphoresis, activity change, appetite change, fatigue and unexpected weight change.  HENT: Negative for sore throat, facial swelling, trouble swallowing, neck pain and voice change.   Eyes: Negative.  Negative for blurred vision.  Respiratory: Negative for apnea, cough, choking, chest tightness, shortness of breath, wheezing and stridor.   Cardiovascular: Negative for chest pain, palpitations, orthopnea and PND.  Gastrointestinal: Negative for nausea, vomiting, abdominal pain, diarrhea and constipation.  Genitourinary: Negative for dysuria, urgency, frequency, hematuria, flank pain, decreased urine volume, enuresis, difficulty urinating and dyspareunia.  Musculoskeletal: Positive for back pain (her LBP is unchanged, she gets relief when her husband does suctions on her back). Negative for myalgias, joint swelling, arthralgias and gait problem.  Skin: Negative for color change, pallor, rash and wound.  Neurological: Negative for dizziness, tremors, seizures, syncope, facial asymmetry, speech difficulty, weakness, light-headedness, numbness and headaches.  Hematological: Negative for adenopathy. Does not bruise/bleed easily.  Psychiatric/Behavioral: Negative for suicidal ideas, hallucinations, behavioral problems, confusion, sleep disturbance, self-injury, dysphoric mood, decreased concentration and agitation. The patient is not nervous/anxious and is not hyperactive.        Objective:   Physical Exam  Vitals reviewed. Constitutional: She is oriented to person, place, and time. She appears well-developed and well-nourished. No distress.  HENT:  Head: Normocephalic and atraumatic.  Right Ear: External ear normal.  Left Ear: External ear normal.  Nose: Nose normal.  Mouth/Throat: Oropharynx is clear and moist. No oropharyngeal exudate.  Eyes: Conjunctivae and EOM are normal.  Pupils are equal, round, and reactive to light. Right eye exhibits no discharge. Left eye exhibits no discharge. No scleral icterus.  Neck: Normal range of motion. Neck supple. No JVD present. No tracheal deviation present. No thyromegaly present.  Cardiovascular: Normal rate,  regular rhythm, normal heart sounds and intact distal pulses.  Exam reveals no gallop and no friction rub.   No murmur heard. Pulmonary/Chest: Effort normal and breath sounds normal. No stridor. No respiratory distress. She has no wheezes. She has no rales. She exhibits no tenderness.  Abdominal: Soft. Bowel sounds are normal. She exhibits no distension and no mass. There is no tenderness. There is no rebound and no guarding.  Musculoskeletal: Normal range of motion. She exhibits no edema and no tenderness.  Lymphadenopathy:    She has no cervical adenopathy.  Neurological: She is alert and oriented to person, place, and time. She has normal reflexes. She displays normal reflexes. No cranial nerve deficit. She exhibits normal muscle tone. Coordination normal.  Skin: Skin is warm and dry. No rash noted. She is not diaphoretic. No erythema. No pallor.  Psychiatric: She has a normal mood and affect. Her behavior is normal. Judgment and thought content normal.        Lab Results  Component Value Date   WBC 8.9 01/13/2010   HGB 11.6 DELTA CHECK NOTED REPEATED TO VERIFY* 01/13/2010   HCT 35.1* 01/13/2010   PLT 170 01/13/2010   CHOL 177 03/16/2010   TRIG 283.0* 03/16/2010   HDL 36.60* 03/16/2010   LDLDIRECT 97.4 03/16/2010   ALT 23 01/11/2010   AST 24 01/11/2010   NA 137 03/16/2010   K 3.9 03/16/2010   CL 101 03/16/2010   CREATININE 0.8 03/16/2010   BUN 21 03/16/2010   CO2 29 03/16/2010   TSH 1.15 07/02/2009   HGBA1C 7.7* 03/16/2010    Assessment & Plan:

## 2010-08-06 NOTE — Patient Instructions (Signed)
Back Pain (Lumbosacral Strain) Back pain is one of the most common causes of pain. There are many causes of back pain. Most are not serious conditions.  CAUSES Your backbone (spinal column) is made up of 24 main vertebral bodies, the sacrum, and the coccyx. These are held together by muscles and tough, fibrous tissue (ligaments). Nerve roots pass through the openings between the vertebrae. A sudden move or injury to the back may cause injury to, or pressure on, these nerves. This may result in localized back pain or pain movement (radiation) into the buttocks, down the leg, and into the foot. Sharp, shooting pain from the buttock down the back of the leg (sciatica) is frequently associated with a ruptured (herniated) disc. Pain may be caused by muscle spasm alone. Your caregiver can often find the cause of your pain by the details of your symptoms and an exam. In some cases, you may need tests (such as X-rays). Your caregiver will work with you to decide if any tests are needed based on your specific exam. HOME CARE INSTRUCTIONS  Avoid an underactive lifestyle. Active exercise, as directed by your caregiver, is your greatest weapon against back pain.   Avoid hard physical activities (tennis, racquetball, water-skiing) if you are not in proper physical condition for it. This may aggravate and/or create problems.   If you have a back problem, avoid sports requiring sudden body movements. Swimming and walking are generally safer activities.   Maintain good posture.   Avoid becoming overweight (obese).   Use bed rest for only the most extreme, sudden (acute) episode. Your caregiver will help you determine how much bed rest is necessary.   For acute conditions, you may put ice on the injured area.   Put ice in a plastic bag.   Place a towel between your skin and the bag.   Leave the ice on for 20 minutes at a time, every 2 hours, or as needed.   After you are improved and more active, it may  help to apply heat for 30 minutes before activities.  See your caregiver if you are having pain that lasts longer than expected. Your caregiver can advise appropriate exercises and/or therapy if needed. With conditioning, most back problems can be avoided. SEEK IMMEDIATE MEDICAL CARE IF:  You have numbness, tingling, weakness, or problems with the use of your arms or legs.   You experience severe back pain not relieved with medicines.   There is a change in bowel or bladder control.   You have increasing pain in any area of the body, including your belly (abdomen).   You notice shortness of breath, dizziness, or feel faint.   You feel sick to your stomach (nauseous), are throwing up (vomiting), or become sweaty.   You notice discoloration of your toes or legs, or your feet get very cold.   Your back pain is getting worse.   You have an oral temperature above 100.5, not controlled by medicine.  MAKE SURE YOU:   Understand these instructions.   Will watch your condition.   Will get help right away if you are not doing well or get worse.  Document Released: 10/20/2004 Document Re-Released: 04/06/2009 Exit[employer] Patient Information 2011 [employer].Diabetes, Type 2 Diabetes is a lasting (chronic) disease. In type 2 diabetes, the pancreas does not make enough insulin (a hormone), and the body does not respond normally to the insulin that is made. This type of diabetes was also previously called adult onset diabetes. About   90% of all those who have diabetes have type 2. It usually occurs after the age of 40 but can occur at any age. CAUSES Unlike type 1 diabetes, which happens because insulin is no longer being made, type 2 diabetes happens because the body is making less insulin and has trouble using the insulin properly. SYMPTOMS  Drinking more than usual.   Urinating more than usual.   Blurred vision.   Dry, itchy skin.   Frequent infection like yeast infections in women.     More tired than usual (fatigue).  TREATMENT  Healthy eating.   Exercise.   Medication, if needed.   Monitoring blood glucose (sugar).   Seeing your caregiver regularly.  HOME CARE INSTRUCTIONS  Check your blood glucose (sugar) at least once daily. More frequent monitoring may be necessary, depending on your medications and on how well your diabetes is controlled. Your caregiver will advise you.   Take your medicine as directed by your caregiver.   Do not smoke.   Make wise food choices. Ask your caregiver for information. Weight loss can improve your diabetes.   Learn about low blood glucose (hypoglycemia) and how to treat it.   Get your eyes checked regularly.   Have a yearly physical exam. Have your blood pressure checked. Get your blood and urine tested.   Wear a pendant or bracelet saying that you have diabetes.   Check your feet every night for sores. Let your caregiver know if you have sores that are not healing.  SEEK MEDICAL CARE IF:  You are having problems keeping your blood glucose at target range.   You feel you might be having problems with your medicines.   You have symptoms of an illness that is not improving after 24 hours.   You have a sore or wound that is not healing.   You notice a change in vision or a new problem with your vision.   You develop a fever of more than 100.5.  Document Released: 01/10/2005 Document Re-Released: 02/01/2009 [employer] Patient Information 2011 [employer]. 

## 2010-08-27 ENCOUNTER — Telehealth: Payer: Self-pay | Admitting: *Deleted

## 2010-08-27 DIAGNOSIS — Z8542 Personal history of malignant neoplasm of other parts of uterus: Secondary | ICD-10-CM

## 2010-08-27 NOTE — Telephone Encounter (Signed)
Pt is req referral to GYN. She has history of uterine cancer and previous gyn does not take her insurance now.

## 2010-08-28 DIAGNOSIS — Z8542 Personal history of malignant neoplasm of other parts of uterus: Secondary | ICD-10-CM | POA: Insufficient documentation

## 2010-11-04 ENCOUNTER — Ambulatory Visit: Payer: Medicare Other | Admitting: Internal Medicine

## 2010-11-08 ENCOUNTER — Other Ambulatory Visit: Payer: Self-pay | Admitting: Internal Medicine

## 2010-11-10 ENCOUNTER — Ambulatory Visit: Payer: Medicare Other | Admitting: Internal Medicine

## 2010-11-20 ENCOUNTER — Other Ambulatory Visit: Payer: Self-pay | Admitting: Internal Medicine

## 2010-11-29 ENCOUNTER — Other Ambulatory Visit (HOSPITAL_COMMUNITY): Payer: Self-pay | Admitting: Obstetrics & Gynecology

## 2010-11-29 DIAGNOSIS — Z1231 Encounter for screening mammogram for malignant neoplasm of breast: Secondary | ICD-10-CM

## 2010-12-07 ENCOUNTER — Encounter: Payer: Self-pay | Admitting: Internal Medicine

## 2010-12-07 ENCOUNTER — Ambulatory Visit (INDEPENDENT_AMBULATORY_CARE_PROVIDER_SITE_OTHER): Payer: Medicare Other | Admitting: Internal Medicine

## 2010-12-07 ENCOUNTER — Ambulatory Visit (INDEPENDENT_AMBULATORY_CARE_PROVIDER_SITE_OTHER)
Admission: RE | Admit: 2010-12-07 | Discharge: 2010-12-07 | Disposition: A | Discharge status: Home / Self Care | Payer: Medicare Other | Source: Ambulatory Visit | Attending: Internal Medicine | Admitting: Internal Medicine

## 2010-12-07 VITALS — BP 140/78 | HR 77 | Temp 97.6°F | Resp 16 | Wt 204.0 lb

## 2010-12-07 DIAGNOSIS — J322 Chronic ethmoidal sinusitis: Secondary | ICD-10-CM | POA: Insufficient documentation

## 2010-12-07 DIAGNOSIS — M25569 Pain in unspecified knee: Secondary | ICD-10-CM

## 2010-12-07 DIAGNOSIS — M25562 Pain in left knee: Secondary | ICD-10-CM

## 2010-12-07 DIAGNOSIS — J019 Acute sinusitis, unspecified: Secondary | ICD-10-CM

## 2010-12-07 DIAGNOSIS — Z23 Encounter for immunization: Secondary | ICD-10-CM

## 2010-12-07 MED ORDER — CEFUROXIME AXETIL 500 MG PO TABS: 500.0000 mg | ORAL_TABLET | Freq: Two times a day (BID) | ORAL | Status: AC

## 2010-12-07 NOTE — Patient Instructions (Signed)

## 2010-12-07 NOTE — Progress Notes (Signed)
Subjective:    Patient ID: Nicole [name], female    DOB: Nov 27, 1940, 70 y.o.   MRN: [id number]  Sinusitis This is a new problem. The current episode started in the past 7 days. The problem has been gradually worsening since onset. There has been no fever. She is experiencing no pain. Associated symptoms include congestion, sinus pressure and a sore throat. Pertinent negatives include no chills, coughing, diaphoresis, ear pain, headaches, hoarse voice, neck pain, shortness of breath, sneezing or swollen glands. Past treatments include nothing.  Knee Pain  The incident occurred more than 1 week ago. There was no injury mechanism. The pain is present in the left knee. The quality of the pain is described as aching. The pain is at a severity of 2/10. The pain is mild. The pain has been constant since onset. Pertinent negatives include no inability to bear weight, loss of motion, loss of sensation, muscle weakness, numbness or tingling. She reports no foreign bodies present. The symptoms are aggravated by weight bearing and movement. She has tried acetaminophen for the symptoms. The treatment provided moderate relief.      Review of Systems  Constitutional: Negative for fever, chills, diaphoresis, activity change, appetite change, fatigue and unexpected weight change.  HENT: Positive for congestion, sore throat, rhinorrhea, postnasal drip and sinus pressure. Negative for hearing loss, ear pain, nosebleeds, hoarse voice, facial swelling, sneezing, trouble swallowing, neck pain, neck stiffness, voice change, tinnitus and ear discharge.   Respiratory: Negative for apnea, cough, choking, chest tightness, shortness of breath, wheezing and stridor.   Cardiovascular: Negative for chest pain, palpitations and leg swelling.  Gastrointestinal: Negative for nausea, vomiting, abdominal pain, diarrhea, constipation, blood in stool and abdominal distention.  Genitourinary: Negative for dysuria, urgency, frequency,  hematuria, flank pain, decreased urine volume, enuresis, difficulty urinating and dyspareunia.  Musculoskeletal: Positive for arthralgias (left knee). Negative for myalgias, back pain, joint swelling and gait problem.  Skin: Negative for color change, pallor, rash and wound.  Neurological: Negative for dizziness, tingling, tremors, seizures, syncope, facial asymmetry, speech difficulty, weakness, light-headedness, numbness and headaches.  Hematological: Negative for adenopathy. Does not bruise/bleed easily.  Psychiatric/Behavioral: Negative.        Objective:   Physical Exam  Vitals reviewed. Constitutional: She is oriented to person, place, and time. She appears well-developed and well-nourished. No distress.  HENT:  Head: No trismus in the jaw.  Right Ear: Hearing, tympanic membrane, external ear and ear canal normal.  Left Ear: Hearing, tympanic membrane, external ear and ear canal normal.  Nose: Mucosal edema and rhinorrhea present. No nose lacerations, sinus tenderness, nasal deformity, septal deviation or nasal septal hematoma. No epistaxis.  No foreign bodies. Right sinus exhibits maxillary sinus tenderness. Right sinus exhibits no frontal sinus tenderness. Left sinus exhibits maxillary sinus tenderness. Left sinus exhibits no frontal sinus tenderness.  Mouth/Throat: Oropharynx is clear and moist and mucous membranes are normal. Mucous membranes are not pale, not dry and not cyanotic. No uvula swelling. No oropharyngeal exudate, posterior oropharyngeal edema, posterior oropharyngeal erythema or tonsillar abscesses.  Eyes: Conjunctivae are normal. Right eye exhibits no discharge. Left eye exhibits no discharge. No scleral icterus.  Neck: Normal range of motion. Neck supple. No JVD present. No tracheal deviation present. No thyromegaly present.  Cardiovascular: Normal rate, regular rhythm, normal heart sounds and intact distal pulses.  Exam reveals no gallop and no friction rub.   No  murmur heard. Pulmonary/Chest: Effort normal and breath sounds normal. No stridor. No respiratory distress. She has  no wheezes. She has no rales. She exhibits no tenderness.  Abdominal: Soft. Bowel sounds are normal. She exhibits no distension and no mass. There is no tenderness. There is no rebound and no guarding.  Musculoskeletal: Normal range of motion. She exhibits no edema.  Lymphadenopathy:    She has no cervical adenopathy.  Neurological: She is oriented to person, place, and time.  Skin: Skin is warm and dry. No rash noted. She is not diaphoretic. No erythema. No pallor.  Psychiatric: She has a normal mood and affect. Her behavior is normal. Judgment and thought content normal.          Assessment & Plan:

## 2010-12-08 ENCOUNTER — Encounter: Payer: Self-pay | Admitting: Internal Medicine

## 2010-12-08 NOTE — Assessment & Plan Note (Signed)
Start ceftin, she does not want anything for symptom relief

## 2010-12-08 NOTE — Assessment & Plan Note (Signed)
I will check a plain xray to see how severe the DJD is

## 2010-12-09 ENCOUNTER — Ambulatory Visit (INDEPENDENT_AMBULATORY_CARE_PROVIDER_SITE_OTHER): Payer: Medicare Other | Admitting: Internal Medicine

## 2010-12-09 ENCOUNTER — Encounter: Payer: Self-pay | Admitting: Internal Medicine

## 2010-12-09 VITALS — BP 122/76 | HR 75 | Temp 97.9°F

## 2010-12-09 DIAGNOSIS — S098XXA Other specified injuries of head, initial encounter: Secondary | ICD-10-CM

## 2010-12-09 DIAGNOSIS — S0990XA Unspecified injury of head, initial encounter: Secondary | ICD-10-CM

## 2010-12-09 NOTE — Patient Instructions (Signed)
It was good to see you today. Neuro exam is normal today - no abnormality to suggest severe injury from head trauma today You may have mild concussion symptoms - at her family keep benign UA notify us of any changes such as slurring of weakness increasing headache or sedation Okay to use Tylenol as needed for aches or pains and apply cool compress to backside of head at area of impact If pain symptoms become worse or any problems, please call us for further treatment and evaluation as

## 2010-12-09 NOTE — Progress Notes (Signed)
  Subjective:    Patient ID: Nicole [name], female    DOB: 22-Jun-1940, 70 y.o.   MRN: [id number]  HPI complains of trauma to posterior head Precipitated by accidental fall from bar stool Onset of trauma this a.m. 8:30 ( less than 4 hours ago) Fall impact absorbed by landing on her bottom, but posterior head (L occipital region) hit edge of bar during fall Denies loss of consciousness, no headache, no vision change No weakness, no dizziness, no change in balance or speech No other trauma or areas of pain into her by fall, but notes ongoing left knee pain for which she saw primary care physician 2 days ago  Past Medical History  Diagnosis Date  . Hypertension   . Cancer     uterine/cervical  . Low back pain   . Osteoarthritis   . Diabetes mellitus     type 2    Review of Systems  Neurological: Negative for dizziness, tremors, seizures, syncope, facial asymmetry, speech difficulty, weakness, light-headedness, numbness and headaches.   see history of present illness above     Objective:   Physical Exam  BP 122/76  Pulse 75  Temp(Src) 97.9 F (36.6 C) (Oral)  SpO2 98% Constitutional: She appears well-developed and well-nourished. No distress.  HENT: Head: Normocephalic and atraumatic. Ears: B TMs ok, no erythema or effusion; Nose: Nose normal.  Mouth/Throat: Oropharynx is clear and moist. No oropharyngeal exudate.  Eyes: Conjunctivae and EOM are normal. Pupils are equal, round, and reactive to light. No scleral icterus.  Neck: Normal range of motion. Neck supple. No JVD present. No thyromegaly present.  Cardiovascular: Normal rate, regular rhythm and normal heart sounds.  No murmur heard. No BLE edema. Pulmonary/Chest: Effort normal and breath sounds normal. No respiratory distress. She has no wheezes. Neurological: She is alert and oriented to person, place, and time. No cranial nerve deficit. Coordination normal.  good finger to nose, negative Romberg Skin: Skin is warm and  dry. No rash, bleeding or bruising noted. No erythema.  posterior scalp intact at site of impact, minimally tender to palpation  Psychiatric: She has a normal mood and affect. Her behavior is normal. Judgment and thought content normal.         Assessment & Plan:  Mild blunt head trauma, accidental fall  No evidence of scalp trauma or skull fracture, neurologically intact No headache symptoms or signs of concussion Not on Plavix or anticoagulation Reassurance provided, education and warning signs for which to monitor are reviewed -call if symptoms or problems Patient understands and agrees

## 2010-12-27 ENCOUNTER — Ambulatory Visit (HOSPITAL_COMMUNITY)
Admission: RE | Admit: 2010-12-27 | Discharge: 2010-12-27 | Disposition: A | Discharge status: Home / Self Care | Payer: Medicare Other | Source: Ambulatory Visit | Attending: Obstetrics & Gynecology | Admitting: Obstetrics & Gynecology

## 2010-12-27 DIAGNOSIS — Z1231 Encounter for screening mammogram for malignant neoplasm of breast: Secondary | ICD-10-CM

## 2011-06-01 ENCOUNTER — Encounter: Payer: Self-pay | Admitting: Internal Medicine

## 2011-06-01 ENCOUNTER — Other Ambulatory Visit (INDEPENDENT_AMBULATORY_CARE_PROVIDER_SITE_OTHER): Payer: Medicare Other

## 2011-06-01 ENCOUNTER — Ambulatory Visit (INDEPENDENT_AMBULATORY_CARE_PROVIDER_SITE_OTHER): Payer: Medicare Other | Admitting: Internal Medicine

## 2011-06-01 VITALS — BP 118/62 | HR 76 | Temp 97.5°F | Resp 16

## 2011-06-01 DIAGNOSIS — E119 Type 2 diabetes mellitus without complications: Secondary | ICD-10-CM

## 2011-06-01 DIAGNOSIS — E781 Pure hyperglyceridemia: Secondary | ICD-10-CM

## 2011-06-01 DIAGNOSIS — I10 Essential (primary) hypertension: Secondary | ICD-10-CM

## 2011-06-01 DIAGNOSIS — IMO0001 Reserved for inherently not codable concepts without codable children: Secondary | ICD-10-CM

## 2011-06-01 DIAGNOSIS — T783XXA Angioneurotic edema, initial encounter: Secondary | ICD-10-CM | POA: Insufficient documentation

## 2011-06-01 LAB — COMPREHENSIVE METABOLIC PANEL
ALT: 37 U/L — ABNORMAL HIGH (ref 0–35)
AST: 28 U/L (ref 0–37)
Albumin: 3.8 g/dL (ref 3.5–5.2)
Alkaline Phosphatase: 44 U/L (ref 39–117)
BUN: 23 mg/dL (ref 6–23)
CO2: 28 mEq/L (ref 19–32)
Calcium: 9.8 mg/dL (ref 8.4–10.5)
Chloride: 101 mEq/L (ref 96–112)
Creatinine, Ser: 1 mg/dL (ref 0.4–1.2)
GFR: 61.33 mL/min (ref >60.00)
Glucose, Bld: 107 mg/dL — ABNORMAL HIGH (ref 70–99)
Potassium: 4.1 mEq/L (ref 3.5–5.1)
Sodium: 138 mEq/L (ref 135–145)
Total Bilirubin: 1.3 mg/dL — ABNORMAL HIGH (ref 0.3–1.2)
Total Protein: 7.8 g/dL (ref 6.0–8.3)

## 2011-06-01 LAB — HEMOGLOBIN A1C: Hgb A1c MFr Bld: 7 % — ABNORMAL HIGH (ref 4.6–6.5)

## 2011-06-01 LAB — TSH: TSH: 0.9 u[IU]/mL (ref 0.35–5.50)

## 2011-06-01 LAB — LIPASE: Lipase: 39 U/L (ref 11.0–59.0)

## 2011-06-01 MED ORDER — METHYLPREDNISOLONE ACETATE 80 MG/ML IJ SUSP
120.0000 mg | Freq: Once | INTRAMUSCULAR | Status: AC
  Administered 2011-06-01: 120 mg via INTRAMUSCULAR

## 2011-06-01 MED ORDER — HYDROXYZINE HCL 10 MG PO TABS: 10.0000 mg | ORAL_TABLET | Freq: Three times a day (TID) | ORAL | Status: AC | PRN

## 2011-06-01 MED ORDER — DESONIDE 0.05 % EX LOTN: TOPICAL_LOTION | Freq: Two times a day (BID) | CUTANEOUS | Status: DC

## 2011-06-01 NOTE — Assessment & Plan Note (Signed)
Her BP is well controlled, I will check her lytes and renal function 

## 2011-06-01 NOTE — Patient Instructions (Signed)
Diabetes, Type 2 Diabetes is a long-lasting (chronic) disease. In type 2 diabetes, the pancreas does not make enough insulin (a hormone), and the body does not respond normally to the insulin that is made. This type of diabetes was also previously called adult-onset diabetes. It usually occurs after the age of 88, but it can occur at any age.  CAUSES  Type 2 diabetes happens because the pancreasis not making enough insulin or your body has trouble using the insulin that your pancreas does make properly. SYMPTOMS   Drinking more than usual.   Urinating more than usual.   Blurred vision.   Dry, itchy skin.   Frequent infections.   Feeling more tired than usual (fatigue).  DIAGNOSIS The diagnosis of type 2 diabetes is usually made by one of the following tests:  Fasting blood glucose test. You will not eat for at least 8 hours and then take a blood test.   Random blood glucose test. Your blood glucose (sugar) is checked at any time of the day regardless of when you ate.   Oral glucose tolerance test (OGTT). Your blood glucose is measured after you have not eaten (fasted) and then after you drink a glucose containing beverage.  TREATMENT   Healthy eating.   Exercise.   Medicine, if needed.   Monitoring blood glucose.   Seeing your caregiver regularly.  HOME CARE INSTRUCTIONS   Check your blood glucose at least once a day. More frequent monitoring may be necessary, depending on your medicines and on how well your diabetes is controlled. Your caregiver will advise you.   Take your medicine as directed by your caregiver.   Do not smoke.   Make wise food choices. Ask your caregiver for information. Weight loss can improve your diabetes.   Learn about low blood glucose (hypoglycemia) and how to treat it.   Get your eyes checked regularly.   Have a yearly physical exam. Have your blood pressure checked and your blood and urine tested.   Wear a pendant or bracelet saying  that you have diabetes.   Check your feet every night for cuts, sores, blisters, and redness. Let your caregiver know if you have any problems.  SEEK MEDICAL CARE IF:   You have problems keeping your blood glucose in target range.   You have problems with your medicines.   You have symptoms of an illness that do not improve after 24 hours.   You have a sore or wound that is not healing.   You notice a change in vision or a new problem with your vision.   You have a fever.  MAKE SURE YOU:  Understand these instructions.   Will watch your condition.   Will get help right away if you are not doing well or get worse.  Document Released: 01/10/2005 Document Revised: 12/30/2010 Document Reviewed: 06/28/2010 [facility] Patient Information 2012 [place], [place].Allergic Reaction, Mild to Moderate Allergies may happen from anything your body is sensitive to. This may be food, medications, pollens, chemicals, and nearly anything around you in everyday life that produces allergens. An allergen is anything that causes an allergy producing substance. Allergens cause your body to release allergic antibodies. Through a chain of events, they cause a release of histamine into the blood stream. Histamines are meant to protect you, but they also cause your discomfort. This is why antihistamines are often used for allergies. Heredity is often a factor in causing allergic reactions. This means you may have some of the same  allergies as your parents. Allergies happen in all age groups. You may have some idea of what caused your reaction. There are many allergens around [place]. It may be difficult to know what caused your reaction. If this is a first time event, it may never happen again. Allergies cannot be cured but can be controlled with medications. SYMPTOMS  You may get some or all of the following problems from allergies.  Swelling and itching in and around the mouth.   Tearing, itchy eyes.   Nasal  congestion and runny nose.   Sneezing and coughing.   An itchy red rash or hives.   Vomiting or diarrhea.   Difficulty breathing.  Seasonal allergies occur in all age groups. They are seasonal because they usually occur during the same season every year. They may be a reaction to molds, grass pollens, or tree pollens. Other causes of allergies are house dust mite allergens, pet dander and mold spores. These are just a common few of the thousands of allergens around [place]. All of the symptoms listed above happen when you come in contact with pollens and other allergens. Seasonal allergies are usually not life threatening. They are generally more of a nuisance that can often be handled using medications. Hay fever is a combination of all or some of the above listed allergy problems. It may often be treated with simple over-the-counter medications such as diphenhydramine. Take medication as directed. Check with your caregiver or package insert for child dosages. TREATMENT AND HOME CARE INSTRUCTIONS If hives or rash are present:  Take medications as directed.   You may use an over-the-counter antihistamine (diphenhydramine) for hives and itching as needed. Do not drive or drink alcohol until medications used to treat the reaction have worn off. Antihistamines tend to make people sleepy.   Apply cold cloths (compresses) to the skin or take baths in cool water. This will help itching. Avoid hot baths or showers. Heat will make a rash and itching worse.   If your allergies persist and become more severe, and over the counter medications are not effective, there are many new medications your caretaker can prescribe. Immunotherapy or desensitizing injections can be used if all else fails. Follow up with your caregiver if problems continue.  SEEK MEDICAL CARE IF:   Your allergies are becoming progressively more troublesome.   You suspect a food allergy. Symptoms generally happen within 30 minutes of  eating a food.   Your symptoms have not gone away within 2 days or are getting worse.   You develop new symptoms.   You want to retest yourself or your child with a food or drink you think causes an allergic reaction. Never test yourself or your child of a suspected allergy without being under the watchful eye of your caregivers. A second exposure to an allergen may be life-threatening.  SEEK IMMEDIATE MEDICAL CARE IF:  You develop difficulty breathing or wheezing, or have a tight feeling in your chest or throat.   You develop a swollen mouth, hives, swelling, or itching all over your body.  A severe reaction with any of the above problems should be considered life-threatening. If you suddenly develop difficulty breathing call for local emergency medical help. THIS IS AN EMERGENCY. MAKE SURE YOU:   Understand these instructions.   Will watch your condition.   Will get help right away if you are not doing well or get worse.  Document Released: 11/07/2006 Document Revised: 12/30/2010 Document Reviewed: 11/07/2006 [employer] Patient Information 2012  [employer]

## 2011-06-01 NOTE — Assessment & Plan Note (Signed)
I will check her a1c today and will monitor her renal function 

## 2011-06-01 NOTE — Assessment & Plan Note (Signed)
She will stop applying the new lotion, I gave her an injection of depo-medrol IM, she will apply topical steroids and will take atarax as needed for rash and itching

## 2011-06-01 NOTE — Progress Notes (Signed)
Subjective:    Patient ID: Nicole [name], female    DOB: 08-02-1940, 71 y.o.   MRN: [id number]  Allergic Reaction This is a new problem. The current episode started 5 to 7 days ago. The problem occurs constantly. The problem has been gradually worsening since onset. The problem is moderate. Associated with: a new lotion. The time of exposure was just prior to onset. Associated symptoms include itching (of both upper eyelids) and a rash (of both upper eyelids). Pertinent negatives include no abdominal pain, chest pain, chest pressure, coughing, diarrhea, difficulty breathing, drooling, eye itching, eye redness, eye watering, globus sensation, hyperventilation, stridor, trouble swallowing, vomiting or wheezing. Swelling location: upper eyelids. Past treatments include nothing.      Review of Systems  Constitutional: Negative for fever, chills, diaphoresis, activity change, appetite change, fatigue and unexpected weight change.  HENT: Negative.  Negative for drooling and trouble swallowing.   Eyes: Negative for photophobia, pain, discharge, redness, itching and visual disturbance.  Respiratory: Negative for apnea, cough, choking, chest tightness, shortness of breath, wheezing and stridor.   Cardiovascular: Negative for chest pain, palpitations and leg swelling.  Gastrointestinal: Negative for nausea, vomiting, abdominal pain, diarrhea and constipation.  Genitourinary: Negative.  Negative for difficulty urinating.  Musculoskeletal: Negative for myalgias, back pain, joint swelling, arthralgias and gait problem.  Skin: Positive for itching (of both upper eyelids) and rash (of both upper eyelids). Negative for color change, pallor and wound.  Neurological: Negative for dizziness, tremors, seizures, syncope, facial asymmetry, speech difficulty, weakness, light-headedness, numbness and headaches.  Hematological: Negative for adenopathy. Does not bruise/bleed easily.  Psychiatric/Behavioral: Negative.         Objective:   Physical Exam  Vitals reviewed. Constitutional: She is oriented to person, place, and time. She appears well-developed and well-nourished. No distress.  HENT:  Head: Normocephalic and atraumatic.  Mouth/Throat: Oropharynx is clear and moist. No oropharyngeal exudate.  Eyes: Conjunctivae and EOM are normal. Pupils are equal, round, and reactive to light. Right eye exhibits no chemosis, no discharge, no exudate and no hordeolum. No foreign body present in the right eye. Left eye exhibits no chemosis, no discharge and no hordeolum. No foreign body present in the left eye. Right conjunctiva is not injected. Right conjunctiva has no hemorrhage. Left conjunctiva is not injected. No scleral icterus. Right eye exhibits normal extraocular motion and no nystagmus. Left eye exhibits normal extraocular motion and no nystagmus. Right pupil is round and reactive. Left pupil is round and reactive. Pupils are equal.         Both upper eyelids show very distinct areas or erythema and swelling c/w localized angioedema  Neck: Normal range of motion. Neck supple. No JVD present. No tracheal deviation present. No thyromegaly present.  Cardiovascular: Normal rate, regular rhythm, normal heart sounds and intact distal pulses.  Exam reveals no gallop and no friction rub.   No murmur heard. Pulmonary/Chest: Effort normal and breath sounds normal. No stridor. No respiratory distress. She has no wheezes. She has no rales. She exhibits no tenderness.  Abdominal: Soft. Bowel sounds are normal. She exhibits no distension and no mass. There is no tenderness. There is no rebound and no guarding.  Musculoskeletal: Normal range of motion. She exhibits no edema and no tenderness.  Lymphadenopathy:    She has no cervical adenopathy.  Neurological: She is oriented to person, place, and time.  Skin: Skin is warm and dry. No rash noted. She is not diaphoretic. No erythema. No pallor.  Psychiatric: She  has a  normal mood and affect. Her behavior is normal. Judgment and thought content normal.      Lab Results  Component Value Date   WBC 4.7 08/06/2010   HGB 14.3 08/06/2010   HCT 41.4 08/06/2010   PLT 213.0 08/06/2010   GLUCOSE 145* 08/06/2010   CHOL 166 08/06/2010   TRIG 190.0* 08/06/2010   HDL 43.00 08/06/2010   LDLDIRECT 97.4 03/16/2010   LDLCALC 85 08/06/2010   ALT 27 08/06/2010   AST 24 08/06/2010   NA 140 08/06/2010   K 4.0 08/06/2010   CL 105 08/06/2010   CREATININE 0.8 08/06/2010   BUN 21 08/06/2010   CO2 29 08/06/2010   TSH 1.11 08/06/2010   HGBA1C 7.5* 08/06/2010      Assessment & Plan:

## 2011-06-01 NOTE — Assessment & Plan Note (Signed)
FLP today 

## 2011-08-02 ENCOUNTER — Other Ambulatory Visit: Payer: Self-pay | Admitting: Internal Medicine

## 2011-08-04 ENCOUNTER — Other Ambulatory Visit: Payer: Self-pay | Admitting: Internal Medicine

## 2011-09-29 LAB — HM DIABETES EYE EXAM: HM Diabetic Eye Exam: NORMAL

## 2011-10-11 ENCOUNTER — Ambulatory Visit (INDEPENDENT_AMBULATORY_CARE_PROVIDER_SITE_OTHER): Payer: Medicare Other

## 2011-10-11 DIAGNOSIS — Z23 Encounter for immunization: Secondary | ICD-10-CM

## 2011-10-31 ENCOUNTER — Ambulatory Visit (INDEPENDENT_AMBULATORY_CARE_PROVIDER_SITE_OTHER): Payer: Medicare Other | Admitting: Internal Medicine

## 2011-10-31 ENCOUNTER — Other Ambulatory Visit (INDEPENDENT_AMBULATORY_CARE_PROVIDER_SITE_OTHER): Payer: Medicare Other

## 2011-10-31 ENCOUNTER — Ambulatory Visit (INDEPENDENT_AMBULATORY_CARE_PROVIDER_SITE_OTHER)
Admission: RE | Admit: 2011-10-31 | Discharge: 2011-10-31 | Disposition: A | Discharge status: Home / Self Care | Payer: Medicare Other | Source: Ambulatory Visit | Attending: Internal Medicine | Admitting: Internal Medicine

## 2011-10-31 ENCOUNTER — Encounter: Payer: Self-pay | Admitting: Internal Medicine

## 2011-10-31 VITALS — BP 120/78 | HR 82 | Temp 98.6°F | Resp 16 | Wt 195.5 lb

## 2011-10-31 DIAGNOSIS — M899 Disorder of bone, unspecified: Secondary | ICD-10-CM

## 2011-10-31 DIAGNOSIS — M949 Disorder of cartilage, unspecified: Secondary | ICD-10-CM

## 2011-10-31 DIAGNOSIS — I10 Essential (primary) hypertension: Secondary | ICD-10-CM

## 2011-10-31 DIAGNOSIS — M8580 Other specified disorders of bone density and structure, unspecified site: Secondary | ICD-10-CM

## 2011-10-31 DIAGNOSIS — S99919A Unspecified injury of unspecified ankle, initial encounter: Secondary | ICD-10-CM

## 2011-10-31 DIAGNOSIS — E119 Type 2 diabetes mellitus without complications: Secondary | ICD-10-CM

## 2011-10-31 DIAGNOSIS — S199XXA Unspecified injury of neck, initial encounter: Secondary | ICD-10-CM

## 2011-10-31 DIAGNOSIS — E781 Pure hyperglyceridemia: Secondary | ICD-10-CM

## 2011-10-31 DIAGNOSIS — S0993XA Unspecified injury of face, initial encounter: Secondary | ICD-10-CM

## 2011-10-31 DIAGNOSIS — M2391 Unspecified internal derangement of right knee: Secondary | ICD-10-CM | POA: Insufficient documentation

## 2011-10-31 DIAGNOSIS — S8991XA Unspecified injury of right lower leg, initial encounter: Secondary | ICD-10-CM

## 2011-10-31 DIAGNOSIS — S99929A Unspecified injury of unspecified foot, initial encounter: Secondary | ICD-10-CM

## 2011-10-31 DIAGNOSIS — S8990XA Unspecified injury of unspecified lower leg, initial encounter: Secondary | ICD-10-CM

## 2011-10-31 LAB — CBC WITH DIFFERENTIAL/PLATELET
Basophils Absolute: 0 10*3/uL (ref 0.0–0.1)
Basophils Relative: 0.2 % (ref 0.0–3.0)
Eosinophils Absolute: 0.1 10*3/uL (ref 0.0–0.7)
Eosinophils Relative: 1.5 % (ref 0.0–5.0)
HCT: 41.2 % (ref 36.0–46.0)
Hemoglobin: 13.7 g/dL (ref 12.0–15.0)
Lymphocytes Relative: 22.6 % (ref 12.0–46.0)
Lymphs Abs: 1.5 10*3/uL (ref 0.7–4.0)
MCHC: 33.3 g/dL (ref 30.0–36.0)
MCV: 89.3 fl (ref 78.0–100.0)
Monocytes Absolute: 0.6 10*3/uL (ref 0.1–1.0)
Monocytes Relative: 8.8 % (ref 3.0–12.0)
Neutro Abs: 4.3 10*3/uL (ref 1.4–7.7)
Neutrophils Relative %: 66.9 % (ref 43.0–77.0)
Platelets: 219 10*3/uL (ref 150.0–400.0)
RBC: 4.61 Mil/uL (ref 3.87–5.11)
RDW: 13.1 % (ref 11.5–14.6)
WBC: 6.5 10*3/uL (ref 4.5–10.5)

## 2011-10-31 LAB — LIPID PANEL
Cholesterol: 163 mg/dL (ref 0–200)
HDL: 37.3 mg/dL — ABNORMAL LOW (ref >39.00)
LDL Cholesterol: 89 mg/dL (ref 0–99)
Total CHOL/HDL Ratio: 4
Triglycerides: 182 mg/dL — ABNORMAL HIGH (ref 0.0–149.0)
VLDL: 36.4 mg/dL (ref 0.0–40.0)

## 2011-10-31 LAB — COMPREHENSIVE METABOLIC PANEL
ALT: 27 U/L (ref 0–35)
AST: 23 U/L (ref 0–37)
Albumin: 3.6 g/dL (ref 3.5–5.2)
Alkaline Phosphatase: 43 U/L (ref 39–117)
BUN: 23 mg/dL (ref 6–23)
CO2: 31 mEq/L (ref 19–32)
Calcium: 9.4 mg/dL (ref 8.4–10.5)
Chloride: 101 mEq/L (ref 96–112)
Creatinine, Ser: 1 mg/dL (ref 0.4–1.2)
GFR: 61.26 mL/min (ref >60.00)
Glucose, Bld: 104 mg/dL — ABNORMAL HIGH (ref 70–99)
Potassium: 4.2 mEq/L (ref 3.5–5.1)
Sodium: 137 mEq/L (ref 135–145)
Total Bilirubin: 0.7 mg/dL (ref 0.3–1.2)
Total Protein: 7.5 g/dL (ref 6.0–8.3)

## 2011-10-31 LAB — HEMOGLOBIN A1C: Hgb A1c MFr Bld: 6.7 % — ABNORMAL HIGH (ref 4.6–6.5)

## 2011-10-31 LAB — TSH: TSH: 0.6 u[IU]/mL (ref 0.35–5.50)

## 2011-10-31 NOTE — Assessment & Plan Note (Signed)
Her BP is well controlled, I will check her lytes and renal function today 

## 2011-10-31 NOTE — Assessment & Plan Note (Signed)
She has abrasion/contusion, I will check a plain film to see if she has a fracture

## 2011-10-31 NOTE — Assessment & Plan Note (Signed)
I will check a plain film to look for fracture

## 2011-10-31 NOTE — Assessment & Plan Note (Signed)
I will check her a1c and her renal function today 

## 2011-10-31 NOTE — Progress Notes (Signed)
Subjective:    Patient ID: Nicole [name], female    DOB: May 23, 1940, 71 y.o.   MRN: [id number]  Injury The incident occurred 2 days ago. The incident occurred at home. The injury mechanism was a fall. The injury occurred in the context of other. No protective equipment was used. Head/neck injury location: right cheek area. Leg Injury Location: right knee. The pain is mild. It is unlikely that a foreign body is present. Pertinent negatives include no abdominal pain, chest pain, coughing, difficulty breathing, headaches, hearing loss, inability to bear weight, light-headedness, loss of consciousness, memory loss, nausea, neck pain, numbness, seizures, tingling, visual disturbance, vomiting or weakness. There have been no prior injuries to these areas. Her tetanus status is UTD.      Review of Systems  Constitutional: Negative for fever, chills, diaphoresis, activity change, appetite change, fatigue and unexpected weight change.  HENT: Negative for hearing loss, facial swelling, neck pain and neck stiffness.   Eyes: Negative.  Negative for visual disturbance.  Respiratory: Negative for cough, chest tightness, shortness of breath, wheezing and stridor.   Cardiovascular: Negative for chest pain, palpitations and leg swelling.  Gastrointestinal: Negative for nausea, vomiting, abdominal pain, diarrhea, constipation, blood in stool, abdominal distention, anal bleeding and rectal pain.  Genitourinary: Negative.   Musculoskeletal: Positive for arthralgias (right knee). Negative for myalgias, back pain, joint swelling and gait problem.  Skin: Negative for color change, pallor, rash and wound.  Neurological: Negative for dizziness, tingling, tremors, seizures, loss of consciousness, syncope, facial asymmetry, speech difficulty, weakness, light-headedness, numbness and headaches.  Hematological: Negative for adenopathy. Does not bruise/bleed easily.  Psychiatric/Behavioral: Negative.  Negative for memory  loss.       Objective:   Physical Exam  Vitals reviewed. Constitutional: She is oriented to person, place, and time. She appears well-developed and well-nourished. No distress.  HENT:  Head: Normocephalic and atraumatic. Head is without raccoon's eyes, without Battle's sign, without abrasion, without contusion, without right periorbital erythema and without left periorbital erythema.  Right Ear: Hearing, tympanic membrane, external ear and ear canal normal.  Left Ear: Hearing, tympanic membrane, external ear and ear canal normal.  Nose: Nose normal.  Mouth/Throat: Oropharynx is clear and moist and mucous membranes are normal.  Eyes: Conjunctivae normal and EOM are normal. Pupils are equal, round, and reactive to light. Right eye exhibits no discharge. Left eye exhibits no discharge. No scleral icterus.  Neck: Normal range of motion. Neck supple. No JVD present. No tracheal deviation present. No thyromegaly present.  Cardiovascular: Normal rate, regular rhythm and intact distal pulses.  Exam reveals no gallop and no friction rub.   No murmur heard. Pulmonary/Chest: Breath sounds normal. No stridor. No respiratory distress. She has no wheezes. She has no rales. She exhibits no tenderness.  Abdominal: Soft. Bowel sounds are normal. She exhibits no distension and no mass. There is no tenderness. There is no rebound and no guarding.  Musculoskeletal: Normal range of motion. She exhibits no edema and no tenderness.       Right knee: She exhibits swelling and deformity (abrasion over the patella). She exhibits normal range of motion, no effusion, no ecchymosis, no erythema, normal alignment, no LCL laxity, normal patellar mobility, no bony tenderness, normal meniscus and no MCL laxity. no tenderness found.  Lymphadenopathy:    She has no cervical adenopathy.  Neurological: She is alert and oriented to person, place, and time. She has normal reflexes. She displays normal reflexes. No cranial nerve  deficit. She exhibits  normal muscle tone. Coordination normal.  Skin: Skin is warm and dry. No rash noted. She is not diaphoretic. No erythema. No pallor.  Psychiatric: She has a normal mood and affect. Her behavior is normal. Judgment and thought content normal.     Lab Results  Component Value Date   WBC 4.7 08/06/2010   HGB 14.3 08/06/2010   HCT 41.4 08/06/2010   PLT 213.0 08/06/2010   GLUCOSE 107* 06/01/2011   CHOL 166 08/06/2010   TRIG 190.0* 08/06/2010   HDL 43.00 08/06/2010   LDLDIRECT 97.4 03/16/2010   LDLCALC 85 08/06/2010   ALT 37* 06/01/2011   AST 28 06/01/2011   NA 138 06/01/2011   K 4.1 06/01/2011   CL 101 06/01/2011   CREATININE 1.0 06/01/2011   BUN 23 06/01/2011   CO2 28 06/01/2011   TSH 0.90 06/01/2011   HGBA1C 7.0* 06/01/2011       Assessment & Plan:

## 2011-10-31 NOTE — Patient Instructions (Signed)
Knee Pain The knee is the complex joint between your thigh and your lower leg. It is made up of bones, tendons, ligaments, and cartilage. The bones that make up the knee are:  The femur in the thigh.  The tibia and fibula in the lower leg.  The patella or kneecap riding in the groove on the lower femur. CAUSES  Knee pain is a common complaint with many causes. A few of these causes are:  Injury, such as:  A ruptured ligament or tendon injury.  Torn cartilage.  Medical conditions, such as:  Gout  Arthritis  Infections  Overuse, over training or overdoing a physical activity. Knee pain can be minor or severe. Knee pain can accompany debilitating injury. Minor knee problems often respond well to self-care measures or get well on their own. More serious injuries may need medical intervention or even surgery. SYMPTOMS The knee is complex. Symptoms of knee problems can vary widely. Some of the problems are:  Pain with movement and weight bearing.  Swelling and tenderness.  Buckling of the knee.  Inability to straighten or extend your knee.  Your knee locks and you cannot straighten it.  Warmth and redness with pain and fever.  Deformity or dislocation of the kneecap. DIAGNOSIS  Determining what is wrong may be very straight forward such as when there is an injury. It can also be challenging because of the complexity of the knee. Tests to make a diagnosis may include:  Your caregiver taking a history and doing a physical exam.  Routine X-rays can be used to rule out other problems. X-rays will not reveal a cartilage tear. Some injuries of the knee can be diagnosed by:  Arthroscopy a surgical technique by which a small video camera is inserted through tiny incisions on the sides of the knee. This procedure is used to examine and repair internal knee joint problems. Tiny instruments can be used during arthroscopy to repair the torn knee cartilage (meniscus).  Arthrography  is a radiology technique. A contrast liquid is directly injected into the knee joint. Internal structures of the knee joint then become visible on X-ray film.  An MRI scan is a non x-ray radiology procedure in which magnetic fields and a computer produce two- or three-dimensional images of the inside of the knee. Cartilage tears are often visible using an MRI scanner. MRI scans have largely replaced arthrography in diagnosing cartilage tears of the knee.  Blood work.  Examination of the fluid that helps to lubricate the knee joint (synovial fluid). This is done by taking a sample out using a needle and a syringe. TREATMENT The treatment of knee problems depends on the cause. Some of these treatments are:  Depending on the injury, proper casting, splinting, surgery or physical therapy care will be needed.  Give yourself adequate recovery time. Do not overuse your joints. If you begin to get sore during workout routines, back off. Slow down or do fewer repetitions.  For repetitive activities such as cycling or running, maintain your strength and nutrition.  Alternate muscle groups. For example if you are a weight lifter, work the upper body on one day and the lower body the next.  Either tight or weak muscles do not give the proper support for your knee. Tight or weak muscles do not absorb the stress placed on the knee joint. Keep the muscles surrounding the knee strong.  Take care of mechanical problems.  If you have flat feet, orthotics or special shoes may help.   See your caregiver if you need help.  Arch supports, sometimes with wedges on the inner or outer aspect of the heel, can help. These can shift pressure away from the side of the knee most bothered by osteoarthritis.  A brace called an "unloader" brace also may be used to help ease the pressure on the most arthritic side of the knee.  If your caregiver has prescribed crutches, braces, wraps or ice, use as directed. The acronym for  this is PRICE. This means protection, rest, ice, compression and elevation.  Nonsteroidal anti-inflammatory drugs (NSAID's), can help relieve pain. But if taken immediately after an injury, they may actually increase swelling. Take NSAID's with food in your stomach. Stop them if you develop stomach problems. Do not take these if you have a history of ulcers, stomach pain or bleeding from the bowel. Do not take without your caregiver's approval if you have problems with fluid retention, heart failure, or kidney problems.  For ongoing knee problems, physical therapy may be helpful.  Glucosamine and chondroitin are over-the-counter dietary supplements. Both may help relieve the pain of osteoarthritis in the knee. These medicines are different from the usual anti-inflammatory drugs. Glucosamine may decrease the rate of cartilage destruction.  Injections of a corticosteroid drug into your knee joint may help reduce the symptoms of an arthritis flare-up. They may provide pain relief that lasts a few months. You may have to wait a few months between injections. The injections do have a small increased risk of infection, water retention and elevated blood sugar levels.  Hyaluronic acid injected into damaged joints may ease pain and provide lubrication. These injections may work by reducing inflammation. A series of shots may give relief for as long as 6 months.  Topical painkillers. Applying certain ointments to your skin may help relieve the pain and stiffness of osteoarthritis. Ask your pharmacist for suggestions. Many over the-counter products are approved for temporary relief of arthritis pain.  In some countries, doctors often prescribe topical NSAID's for relief of chronic conditions such as arthritis and tendinitis. A review of treatment with NSAID creams found that they worked as well as oral medications but without the serious side effects. PREVENTION  Maintain a healthy weight. Extra pounds put  more strain on your joints.  Get strong, stay limber. Weak muscles are a common cause of knee injuries. Stretching is important. Include flexibility exercises in your workouts.  Be smart about exercise. If you have osteoarthritis, chronic knee pain or recurring injuries, you may need to change the way you exercise. This does not mean you have to stop being active. If your knees ache after jogging or playing basketball, consider switching to swimming, water aerobics or other low-impact activities, at least for a few days a week. Sometimes limiting high-impact activities will provide relief.  Make sure your shoes fit well. Choose footwear that is right for your sport.  Protect your knees. Use the proper gear for knee-sensitive activities. Use kneepads when playing volleyball or laying carpet. Buckle your seat belt every time you drive. Most shattered kneecaps occur in car accidents.  Rest when you are tired. SEEK MEDICAL CARE IF:  You have knee pain that is continual and does not seem to be getting better.  SEEK IMMEDIATE MEDICAL CARE IF:  Your knee joint feels hot to the touch and you have a high fever. MAKE SURE YOU:   Understand these instructions.  Will watch your condition.  Will get help right away if you are not   doing well or get worse. Document Released: 11/07/2006 Document Revised: 04/04/2011 Document Reviewed: 11/07/2006 [employer] Patient Information 2013 [employer].  

## 2011-10-31 NOTE — Assessment & Plan Note (Signed)
FLP today 

## 2011-11-03 LAB — VITAMIN D 1,25 DIHYDROXY
Vitamin D 1, 25 (OH)2 Total: 31 pg/mL (ref 18–72)
Vitamin D2 1, 25 (OH)2: <8 pg/mL
Vitamin D3 1, 25 (OH)2: 31 pg/mL

## 2011-11-04 ENCOUNTER — Encounter: Payer: Self-pay | Admitting: Internal Medicine

## 2011-11-15 ENCOUNTER — Ambulatory Visit: Payer: Medicare Other | Admitting: Endocrinology

## 2011-11-15 ENCOUNTER — Telehealth: Payer: Self-pay | Admitting: Internal Medicine

## 2011-11-15 ENCOUNTER — Ambulatory Visit: Payer: Medicare Other | Admitting: Family

## 2011-11-15 NOTE — Telephone Encounter (Signed)
Caller: [name]/Patient; Patient Name: Nicole [name]; PCP: Rene Paci (Adults only); Best Callback Phone Number: [phone] Larey Seat and hit face and Right knee on concrete on Sunday 10/29/2101.  Seen in office 10/7 and had Xrays of face and knee that day.  Has not heard back from Xray reports.  Has been walking for the last 2 weeks okay.  Suddenly today 11/15/2011 is not able to bear weight on Right knee.  Has sharp pains in certain positions and feels that the area from knee to Rt hip is not right.  Can move knee but has to do so slowly so the pain will not be severe.  Triaged in Knee Injury Guideline - Disposition:  See ED Immediately due to new onset of inability to take unassisted weight-bearing steps.  Appt 3:00 pm Dr Everardo All since appt not available with PMD.  Patient requesting to see MD.

## 2011-11-16 ENCOUNTER — Encounter: Payer: Self-pay | Admitting: Internal Medicine

## 2011-11-16 ENCOUNTER — Ambulatory Visit (INDEPENDENT_AMBULATORY_CARE_PROVIDER_SITE_OTHER): Payer: Medicare Other | Admitting: Internal Medicine

## 2011-11-16 VITALS — BP 112/70 | HR 77 | Temp 97.6°F | Resp 16 | Ht 66.5 in | Wt 198.2 lb

## 2011-11-16 DIAGNOSIS — M1990 Unspecified osteoarthritis, unspecified site: Secondary | ICD-10-CM

## 2011-11-16 DIAGNOSIS — M2391 Unspecified internal derangement of right knee: Secondary | ICD-10-CM

## 2011-11-16 DIAGNOSIS — M2390 Unspecified internal derangement of unspecified knee: Secondary | ICD-10-CM

## 2011-11-16 NOTE — Patient Instructions (Signed)
Knee, Cartilage (Meniscus) Injury  It is suspected that you have a torn cartilage (meniscus) in your knee. The menisci are made of tough cartilage, and fit between the surfaces of the thigh and leg bones. The menisci are "C"shaped and have a wedged profile. The wedged profile helps the stability of the joint by keeping the rounded femur surface from sliding off the flat tibial surface. The menisci are fed (nourished) by small blood vessels; but there is also a large area at the inner edge of the meniscus that does not have a good blood supply (avascular). This presents a problem when there is an injury to the meniscus, because areas without good blood supply heal poorly. As a result when there is a torn cartilage in the knee, surgery is often required to fix it. This is usually done with a surgical procedure less invasive than open surgery (arthroscopy). Some times open surgery of the knee is required if there is other damage.  PURPOSE OF THE MENISCUS  The medial meniscus rests on the medial tibial plateau. The tibia is the large bone in your lower leg (the shin bone). The medial tibial plateau is the upper end of the bone making up the inner part of your knee. The lateral meniscus serves the same purpose and is located on the outside of the knee. The menisci help to distribute your body weight across the knee joint; they act as shock absorbers. Without the meniscus present, the weight of your body would be unevenly applied to the bones in your legs (the femur and tibia). The femur is the large bone in your thigh. This uneven weight distribution would cause increased wear and tear on the cartilage lining the joint surfaces, leading to early damage (arthritis) of these areas. The presence of the menisci cartilage is necessary for a healthy knee.  PURPOSE OF THE KNEE CARTILAGE  The knee joint is made up of three bones: the thigh bone (femur), the shin bone (tibia), and the knee cap (patella). The surfaces of these  bones at the knee joint are covered with cartilage called articular cartilage. This smooth slippery surface allows the bones to slide against each other without causing bone damage. The meniscus sits between these cartilaginous surfaces of the bones. It distributes the weight evenly in the joints and helps with the stability of the joint (keeps the joint steady).  HOME CARE INSTRUCTIONS   Use crutches and external braces as instructed.   Once home an ice pack applied to your injured knee may help with discomfort and keep the swelling down. An ice pack can be used for the first couple of days or as instructed.   Only take over-the-counter or prescription medicines for pain, discomfort, or fever as directed by your caregiver.   Call if you do not have relief of pain with medications or if there is increasing in pain.   Call if your foot becomes cold or blue.   You may resume normal diet and activities as directed.   Make sure to keep your appointment with your follow-up caregiver. This injury may require further evaluation and treatment beyond the temporary treatment given today.  Document Released: 04/02/2002 Document Revised: 04/04/2011 Document Reviewed: 07/25/2008  [employer] Patient Information 2013 [employer].

## 2011-11-16 NOTE — Progress Notes (Signed)
Subjective:    Patient ID: Nicole [name], female    DOB: Nov 04, 1940, 71 y.o.   MRN: [id number]  Arthritis Presents for follow-up visit. She complains of pain and stiffness. She reports no joint swelling or joint warmth. The symptoms have been worsening. Affected locations include the left knee and right knee. Her pain is at a severity of 3/10. Pertinent negatives include no diarrhea, dry eyes, dry mouth, dysuria, fatigue, fever, pain at night, pain while resting, rash, Raynaud's syndrome, uveitis or weight loss. Compliance with total regimen is 0-25%. Compliance problems include psychosocial issues.       Review of Systems  Constitutional: Negative for fever, chills, weight loss, diaphoresis, activity change, appetite change, fatigue and unexpected weight change.  HENT: Negative.   Eyes: Negative.   Respiratory: Negative.   Cardiovascular: Negative.   Gastrointestinal: Negative.  Negative for diarrhea.  Genitourinary: Negative.  Negative for dysuria.  Musculoskeletal: Positive for arthralgias (right knee > left knee), arthritis and stiffness. Negative for myalgias, back pain, joint swelling and gait problem.  Skin: Negative.  Negative for rash.  Neurological: Negative.   Hematological: Negative for adenopathy. Does not bruise/bleed easily.  Psychiatric/Behavioral: Negative.        Objective:   Physical Exam  Vitals reviewed. Constitutional: She is oriented to person, place, and time. She appears well-developed and well-nourished. No distress.  HENT:  Head: Normocephalic and atraumatic.  Mouth/Throat: Oropharynx is clear and moist. No oropharyngeal exudate.  Eyes: Conjunctivae normal are normal. Right eye exhibits no discharge. Left eye exhibits no discharge. No scleral icterus.  Neck: Normal range of motion. Neck supple. No JVD present. No tracheal deviation present. No thyromegaly present.  Cardiovascular: Normal rate, regular rhythm, normal heart sounds and intact distal  pulses.  Exam reveals no gallop and no friction rub.   No murmur heard. Pulmonary/Chest: Effort normal and breath sounds normal. No stridor. No respiratory distress. She has no wheezes. She has no rales. She exhibits no tenderness.  Abdominal: Soft. Bowel sounds are normal. She exhibits no distension. There is no tenderness. There is no rebound and no guarding.  Musculoskeletal: Normal range of motion. She exhibits no edema and no tenderness.       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 She exhibits normal range of motion, no swelling, no effusion, no ecchymosis, no erythema, normal alignment, no LCL laxity, normal patellar mobility and no bony tenderness. no tenderness found.       Left knee: She exhibits deformity (DJD changes). She exhibits normal range of motion, no swelling, no effusion, no ecchymosis, no laceration, no erythema, normal alignment, no LCL laxity, normal patellar mobility and no bony tenderness. no tenderness found.  Lymphadenopathy:    She has no cervical adenopathy.  Neurological: She is oriented to person, place, and time.  Skin: Skin is warm and dry. No rash noted. She is not diaphoretic. No erythema. No pallor.  Psychiatric: She has a normal mood and affect. Her behavior is normal. Judgment and thought content normal.     Dg Facial Bones Complete  10/31/2011  *RADIOLOGY REPORT*  Clinical Data: Fall yesterday, right facial pain  FACIAL BONES COMPLETE 3+V  Comparison: None.  Findings: No acute bony fracture is seen.  Paranasal sinuses are well-aerated.  No gross soft tissue abnormality is seen.  IMPRESSION: No acute abnormality noted.   Original Report Authenticated By: Phillips Odor, M.D.    Dg Knee Complete 4 Views Right  10/31/2011  *RADIOLOGY REPORT*  Clinical Data: Fall  yesterday with pain.  RIGHT KNEE - COMPLETE 4+ VIEW  Comparison: None.  Findings: Moderate medial and mild lateral compartment osteophyte formation.  Medial compartment joint space narrowing.   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 No joint effusion.  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   Original Report Authenticated By: Consuello Bossier, M.D.       Assessment & Plan:

## 2011-11-17 NOTE — Assessment & Plan Note (Signed)
She will continue taking ibuprofen, ortho referral for consideration of TKR

## 2011-11-17 NOTE — Assessment & Plan Note (Signed)
Ortho referral, she may need a surgical intervention

## 2011-11-28 ENCOUNTER — Other Ambulatory Visit: Payer: Self-pay | Admitting: Internal Medicine

## 2011-11-28 ENCOUNTER — Ambulatory Visit (INDEPENDENT_AMBULATORY_CARE_PROVIDER_SITE_OTHER)
Admission: RE | Admit: 2011-11-28 | Discharge: 2011-11-28 | Disposition: A | Discharge status: Home / Self Care | Payer: Medicare Other | Source: Ambulatory Visit | Attending: Internal Medicine | Admitting: Internal Medicine

## 2011-11-28 ENCOUNTER — Encounter: Payer: Self-pay | Admitting: Internal Medicine

## 2011-11-28 ENCOUNTER — Ambulatory Visit (INDEPENDENT_AMBULATORY_CARE_PROVIDER_SITE_OTHER): Payer: Medicare Other | Admitting: Internal Medicine

## 2011-11-28 VITALS — BP 108/62 | HR 78 | Temp 97.8°F | Resp 16 | Ht 66.5 in | Wt 196.8 lb

## 2011-11-28 DIAGNOSIS — Z1231 Encounter for screening mammogram for malignant neoplasm of breast: Secondary | ICD-10-CM

## 2011-11-28 DIAGNOSIS — M546 Pain in thoracic spine: Secondary | ICD-10-CM | POA: Insufficient documentation

## 2011-11-28 DIAGNOSIS — Z23 Encounter for immunization: Secondary | ICD-10-CM

## 2011-11-28 NOTE — Progress Notes (Signed)
Subjective:    Patient ID: Nicole [name] Overall, female    DOB: [birth year], 71 y.o.   MRN: [id number]  Back Pain This is a recurrent problem. The current episode started 1 to 4 weeks ago. The problem is unchanged. The pain is present in the thoracic spine. The quality of the pain is described as shooting. Radiates to: right flank. The pain is at a severity of 2/10. The pain is mild. The pain is worse during the day. The symptoms are aggravated by twisting and bending. Pertinent negatives include no abdominal pain, bladder incontinence, bowel incontinence, chest pain, dysuria, fever, headaches, leg pain, numbness, paresis, paresthesias, pelvic pain, perianal numbness, tingling, weakness or weight loss. Risk factors include recent trauma. She has tried NSAIDs for the symptoms. The treatment provided significant relief.      Review of Systems  Constitutional: Negative for fever, chills, weight loss, diaphoresis, activity change, fatigue and unexpected weight change.  HENT: Negative.   Eyes: Negative.   Respiratory: Negative for cough, chest tightness, shortness of breath, wheezing and stridor.   Cardiovascular: Negative for chest pain, palpitations and leg swelling.  Gastrointestinal: Negative for nausea, vomiting, abdominal pain, diarrhea, constipation, blood in stool and bowel incontinence.  Genitourinary: Negative for bladder incontinence, dysuria, urgency, frequency, hematuria, decreased urine volume, difficulty urinating and pelvic pain.  Musculoskeletal: Positive for back pain. Negative for myalgias, joint swelling, arthralgias and gait problem.  Skin: Negative for color change, pallor and rash.  Neurological: Negative.  Negative for tingling, weakness, numbness, headaches and paresthesias.  Hematological: Negative for adenopathy. Does not bruise/bleed easily.  Psychiatric/Behavioral: Negative.        Objective:   Physical Exam  Vitals reviewed. Constitutional: She is oriented to person,  place, and time. She appears well-developed and well-nourished.  Non-toxic appearance. She does not have a sickly appearance. She does not appear ill. No distress.  HENT:  Head: Normocephalic and atraumatic.  Mouth/Throat: Oropharynx is clear and moist. No oropharyngeal exudate.  Eyes: Conjunctivae normal are normal. Right eye exhibits no discharge. Left eye exhibits no discharge. No scleral icterus.  Neck: Normal range of motion. Neck supple. No JVD present. No tracheal deviation present. No thyromegaly present.  Cardiovascular: Normal rate, regular rhythm, normal heart sounds and intact distal pulses.  Exam reveals no gallop and no friction rub.   No murmur heard. Pulmonary/Chest: Effort normal and breath sounds normal. No stridor. No respiratory distress. She has no wheezes. She has no rales. She exhibits no tenderness.  Abdominal: Soft. Bowel sounds are normal. She exhibits no distension and no mass. There is no tenderness. There is no rebound and no guarding.  Musculoskeletal: Normal range of motion. She exhibits no edema and no tenderness.       Thoracic back: She exhibits tenderness. She exhibits normal range of motion, no bony tenderness, no swelling, no edema, no deformity, no laceration, no pain, no spasm and normal pulse.       Back:  Lymphadenopathy:    She has no cervical adenopathy.  Neurological: She is oriented to person, place, and time.  Skin: Skin is warm and dry. No rash noted. She is not diaphoretic. No erythema. No pallor.  Psychiatric: She has a normal mood and affect. Her behavior is normal. Judgment and thought content normal.     Lab Results  Component Value Date   WBC 6.5 10/31/2011   HGB 13.7 10/31/2011   HCT 41.2 10/31/2011   PLT 219.0 10/31/2011   GLUCOSE 104* 10/31/2011   CHOL  163 10/31/2011   TRIG 182.0* 10/31/2011   HDL 37.30* 10/31/2011   LDLDIRECT 97.4 03/16/2010   LDLCALC 89 10/31/2011   ALT 27 10/31/2011   AST 23 10/31/2011   NA 137 10/31/2011   K 4.2  10/31/2011   CL 101 10/31/2011   CREATININE 1.0 10/31/2011   BUN 23 10/31/2011   CO2 31 10/31/2011   TSH 0.60 10/31/2011   HGBA1C 6.7* 10/31/2011       Assessment & Plan:

## 2011-11-28 NOTE — Addendum Note (Signed)
Addended by: Rock Nephew T on: 11/28/2011 04:51 PM   Modules accepted: Orders

## 2011-11-28 NOTE — Assessment & Plan Note (Signed)
She will continue taking nsaids for pain, I will check a plain film of her thoracic spine to look for fracture

## 2011-11-28 NOTE — Patient Instructions (Signed)
Back Pain, Adult Low back pain is very common. About 1 in 5 people have back pain.The cause of low back pain is rarely dangerous. The pain often gets better over time.About half of people with a sudden onset of back pain feel better in just 2 weeks. About 8 in 10 people feel better by 6 weeks.  CAUSES Some common causes of back pain include:  Strain of the muscles or ligaments supporting the spine.  Wear and tear (degeneration) of the spinal discs.  Arthritis.  Direct injury to the back. DIAGNOSIS Most of the time, the direct cause of low back pain is not known.However, back pain can be treated effectively even when the exact cause of the pain is unknown.Answering your caregiver's questions about your overall health and symptoms is one of the most accurate ways to make sure the cause of your pain is not dangerous. If your caregiver needs more information, he or she may order lab work or imaging tests (X-rays or MRIs).However, even if imaging tests show changes in your back, this usually does not require surgery. HOME CARE INSTRUCTIONS For many people, back pain returns.Since low back pain is rarely dangerous, it is often a condition that people can learn to manageon their own.   Remain active. It is stressful on the back to sit or stand in one place. Do not sit, drive, or stand in one place for more than 30 minutes at a time. Take short walks on level surfaces as soon as pain allows.Try to increase the length of time you walk each day.  Do not stay in bed.Resting more than 1 or 2 days can delay your recovery.  Do not avoid exercise or work.Your body is made to move.It is not dangerous to be active, even though your back may hurt.Your back will likely heal faster if you return to being active before your pain is gone.  Pay attention to your body when you bend and lift. Many people have less discomfortwhen lifting if they bend their knees, keep the load close to their bodies,and  avoid twisting. Often, the most comfortable positions are those that put less stress on your recovering back.  Find a comfortable position to sleep. Use a firm mattress and lie on your side with your knees slightly bent. If you lie on your back, put a pillow under your knees.  Only take over-the-counter or prescription medicines as directed by your caregiver. Over-the-counter medicines to reduce pain and inflammation are often the most helpful.Your caregiver may prescribe muscle relaxant drugs.These medicines help dull your pain so you can more quickly return to your normal activities and healthy exercise.  Put ice on the injured area.  Put ice in a plastic bag.  Place a towel between your skin and the bag.  Leave the ice on for 15 to 20 minutes, 3 to 4 times a day for the first 2 to 3 days. After that, ice and heat may be alternated to reduce pain and spasms.  Ask your caregiver about trying back exercises and gentle massage. This may be of some benefit.  Avoid feeling anxious or stressed.Stress increases muscle tension and can worsen back pain.It is important to recognize when you are anxious or stressed and learn ways to manage it.Exercise is a great option. SEEK MEDICAL CARE IF:  You have pain that is not relieved with rest or medicine.  You have pain that does not improve in 1 week.  You have new symptoms.  You are generally   not feeling well. SEEK IMMEDIATE MEDICAL CARE IF:   You have pain that radiates from your back into your legs.  You develop new bowel or bladder control problems.  You have unusual weakness or numbness in your arms or legs.  You develop nausea or vomiting.  You develop abdominal pain.  You feel faint. Document Released: 01/10/2005 Document Revised: 07/12/2011 Document Reviewed: 05/31/2010 Exit[employer] Patient Information 2013 [employer].  

## 2011-12-19 ENCOUNTER — Other Ambulatory Visit: Payer: Self-pay | Admitting: Internal Medicine

## 2011-12-28 ENCOUNTER — Ambulatory Visit (HOSPITAL_COMMUNITY)
Admission: RE | Admit: 2011-12-28 | Discharge: 2011-12-28 | Disposition: A | Discharge status: Home / Self Care | Payer: Medicare Other | Source: Ambulatory Visit | Attending: Internal Medicine | Admitting: Internal Medicine

## 2011-12-28 DIAGNOSIS — Z1231 Encounter for screening mammogram for malignant neoplasm of breast: Secondary | ICD-10-CM

## 2011-12-30 LAB — HM MAMMOGRAPHY: HM Mammogram: NORMAL

## 2012-03-16 ENCOUNTER — Encounter (HOSPITAL_COMMUNITY): Payer: Self-pay | Admitting: Pharmacy Technician

## 2012-03-19 ENCOUNTER — Encounter (HOSPITAL_COMMUNITY)
Admission: RE | Admit: 2012-03-19 | Discharge: 2012-03-19 | Disposition: A | Discharge status: Home / Self Care | Payer: Medicare Other | Source: Ambulatory Visit | Attending: Orthopedic Surgery | Admitting: Orthopedic Surgery

## 2012-03-19 ENCOUNTER — Ambulatory Visit (HOSPITAL_COMMUNITY)
Admission: RE | Admit: 2012-03-19 | Discharge: 2012-03-19 | Disposition: A | Discharge status: Home / Self Care | Payer: Medicare Other | Source: Ambulatory Visit | Attending: Surgical | Admitting: Surgical

## 2012-03-19 ENCOUNTER — Encounter (HOSPITAL_COMMUNITY): Payer: Self-pay

## 2012-03-19 DIAGNOSIS — Z01810 Encounter for preprocedural cardiovascular examination: Secondary | ICD-10-CM | POA: Insufficient documentation

## 2012-03-19 DIAGNOSIS — Z01818 Encounter for other preprocedural examination: Secondary | ICD-10-CM | POA: Insufficient documentation

## 2012-03-19 DIAGNOSIS — Z01812 Encounter for preprocedural laboratory examination: Secondary | ICD-10-CM | POA: Insufficient documentation

## 2012-03-19 DIAGNOSIS — R9431 Abnormal electrocardiogram [ECG] [EKG]: Secondary | ICD-10-CM | POA: Insufficient documentation

## 2012-03-19 HISTORY — DX: Bronchitis, not specified as acute or chronic: J40

## 2012-03-19 LAB — COMPREHENSIVE METABOLIC PANEL
ALT: 21 U/L (ref 0–35)
AST: 19 U/L (ref 0–37)
Albumin: 3.4 g/dL — ABNORMAL LOW (ref 3.5–5.2)
Alkaline Phosphatase: 54 U/L (ref 39–117)
BUN: 17 mg/dL (ref 6–23)
CO2: 26 mEq/L (ref 19–32)
Calcium: 9.6 mg/dL (ref 8.4–10.5)
Chloride: 100 mEq/L (ref 96–112)
Creatinine, Ser: 0.76 mg/dL (ref 0.50–1.10)
GFR calc Af Amer: >90 mL/min (ref >90)
GFR calc non Af Amer: 84 mL/min — ABNORMAL LOW (ref >90)
Glucose, Bld: 141 mg/dL — ABNORMAL HIGH (ref 70–99)
Potassium: 3.5 mEq/L (ref 3.5–5.1)
Sodium: 136 mEq/L (ref 135–145)
Total Bilirubin: 0.5 mg/dL (ref 0.3–1.2)
Total Protein: 7.2 g/dL (ref 6.0–8.3)

## 2012-03-19 LAB — URINE MICROSCOPIC-ADD ON

## 2012-03-19 LAB — SURGICAL PCR SCREEN
MRSA, PCR: NEGATIVE
Staphylococcus aureus: NEGATIVE

## 2012-03-19 LAB — CBC
HCT: 40.5 % (ref 36.0–46.0)
Hemoglobin: 13.3 g/dL (ref 12.0–15.0)
MCH: 29 pg (ref 26.0–34.0)
MCHC: 32.8 g/dL (ref 30.0–36.0)
MCV: 88.2 fL (ref 78.0–100.0)
Platelets: 224 10*3/uL (ref 150–400)
RBC: 4.59 MIL/uL (ref 3.87–5.11)
RDW: 13.2 % (ref 11.5–15.5)
WBC: 5.7 10*3/uL (ref 4.0–10.5)

## 2012-03-19 LAB — URINALYSIS, ROUTINE W REFLEX MICROSCOPIC
Bilirubin Urine: NEGATIVE
Glucose, UA: 250 mg/dL — AB
Hgb urine dipstick: NEGATIVE
Ketones, ur: NEGATIVE mg/dL
Nitrite: NEGATIVE
Protein, ur: NEGATIVE mg/dL
Specific Gravity, Urine: 1.02 (ref 1.005–1.030)
Urobilinogen, UA: 0.2 mg/dL (ref 0.0–1.0)
pH: 6 (ref 5.0–8.0)

## 2012-03-19 LAB — APTT: aPTT: 31 seconds (ref 24–37)

## 2012-03-19 LAB — PROTIME-INR
INR: 0.91 (ref 0.00–1.49)
Prothrombin Time: 12.2 seconds (ref 11.6–15.2)

## 2012-03-19 NOTE — Patient Instructions (Addendum)
22 Nicole [name]  03/19/2012   Your procedure is scheduled on: 03/28/12  Report to [facility] at 0600 AM.  Call this number if you have problems the morning of surgery [phone]: [phone]   Remember:   Do not eat food or drink liquids After Midnight.     Take these medicines the morning of surgery with A SIP OF WATER: metoprolol   Do not wear jewelry, make-up or nail polish.  Do not wear lotions, powders, or perfumes. You may wear deodorant.  Do not shave 48 hours prior to surgery. Men may shave face and neck.  Do not bring valuables to the hospital.  Contacts, dentures or bridgework may not be worn into surgery.  Leave suitcase in the car. After surgery it may be brought to your room.  For patients admitted to the hospital, checkout time is 11:00 AM the day of discharge.    Please read over the following fact sheets that you were given: MRSA Information, blood fact sheet, incentive spirometer fact sheet  Birdie Sons, RN  pre op nurse call if needed [phone]    FAILURE TO FOLLOW THESE INSTRUCTIONS MAY RESULT IN CANCELLATION OF YOUR SURGERY   Patient Signature: ___________________________________________

## 2012-03-20 NOTE — H&P (Signed)
TOTAL KNEE ADMISSION H&P  Patient is being admitted for left total knee arthroplasty.  Subjective:  Chief Complaint:left knee pain.  HPI: Nicole WELKE, 72 y.o. female, has a history of pain and functional disability in the left knee due to arthritis and has failed non-surgical conservative treatments for greater than 12 weeks to includeNSAID's and/or analgesics, corticosteriod injections and activity modification.  Onset of symptoms was gradual, starting 2 years ago with gradually worsening course since that time. The patient noted prior procedures on the knee to include  arthroscopy on the left knee(s).  Patient currently rates pain in the left knee(s) at 7 out of 10 with activity. Patient has night pain, worsening of pain with activity and weight bearing, pain that interferes with activities of daily living, pain with passive range of motion, crepitus and joint swelling.  Patient has evidence of periarticular osteophytes, joint subluxation and joint space narrowing by imaging studies.  There is no active infection.  Patient Active Problem List   Diagnosis Date Noted  . Acute thoracic back pain 11/28/2011  . Internal derangement of right knee 10/31/2011  . Osteopenia 10/31/2011  . History of uterine cancer 08/28/2010  . HYPERTRIGLYCERIDEMIA 07/15/2009  . Type II or unspecified type diabetes mellitus without mention of complication, uncontrolled 01/20/2009  . HYPERTENSION 01/20/2009  . OSTEOARTHRITIS 01/20/2009  . LOW BACK PAIN 01/20/2009   Past Medical History  Diagnosis Date  . Hypertension   . Low back pain   . Osteoarthritis   . Diabetes mellitus     type 2  . Bronchitis     hx of  . Cancer 2004    uterine/cervical    Past Surgical History  Procedure Laterality Date  . Abdominal hysterectomy  2004  . Tubal ligation    . Appendectomy  when 72 years old  . Incontinence surgery    . Knee arthroscopy Left      Current outpatient prescriptions: Coenzyme Q10 (CO Q 10) 100  MG CAPS, Take 200 mg by mouth daily. , Disp: , Rfl: ;   FREESTYLE LITE test strip, , Disp: , Rfl: ;    HORSE CHESTNUT PO, Take 200 mg by mouth 2 (two) times daily., Disp: , Rfl: ;   hydrocortisone cream 1 %, Apply 1 application topically 2 (two) times daily as needed (itching)., Disp: , Rfl:  losartan-hydrochlorothiazide (HYZAAR) 100-25 MG per tablet, Take 1 tablet by mouth every morning., Disp: , Rfl: ;   metoprolol succinate (TOPROL-XL) 50 MG 24 hr tablet, Take 50 mg by mouth every morning. Take with or immediately following a meal., Disp: , Rfl: ;   Misc Natural Products (OSTEO BI-FLEX ADV TRIPLE ST PO), Take 1 tablet by mouth daily. Has vitamin d 2000 units, Disp: , Rfl:  OVER THE COUNTER MEDICATION, Apply 1 patch topically daily. [place]. Pt gets from [place] for pain, Disp: , Rfl: ;   sitaGLIPtan-metformin (JANUMET) 50-500 MG per tablet, Take 1 tablet by mouth 2 (two) times daily with a meal., Disp: , Rfl: ;   vitamin B-12 (CYANOCOBALAMIN) 500 MCG tablet, Take 500 mcg by mouth daily., Disp: , Rfl:   No Known Allergies  History  Substance Use Topics  . Smoking status: Never Smoker   . Smokeless tobacco: Never Used  . Alcohol Use: No    Family History  Problem Relation Age of Onset  . Arthritis Other   . Cancer Other     colon, lst degree relative  . Diabetes Other  st degree relative  . Hyperlipidemia Other   . Hypertension Other      Review of Systems  Constitutional: Negative.   HENT: Negative.  Negative for neck pain.   Eyes: Negative.   Respiratory: Negative.   Cardiovascular: Negative.   Gastrointestinal: Positive for blood in stool. Negative for heartburn, nausea, vomiting, abdominal pain, diarrhea, constipation and melena.  Genitourinary: Positive for urgency and frequency. Negative for dysuria, hematuria and flank pain.  Musculoskeletal: Positive for joint pain and falls. Negative for myalgias and back pain.       Left knee pain  Skin: Negative.    Endo/Heme/Allergies: Negative.   Psychiatric/Behavioral: Negative.     Objective:  Physical Exam  Constitutional: She is oriented to person, place, and time. She appears well-developed and well-nourished. No distress.  HENT:  Head: Normocephalic and atraumatic.  Right Ear: External ear normal.  Left Ear: External ear normal.  Nose: Nose normal.  Mouth/Throat: Oropharynx is clear and moist.  Eyes: Conjunctivae and EOM are normal.  Neck: Normal range of motion. Neck supple. No tracheal deviation present. No thyromegaly present.  Cardiovascular: Normal rate, regular rhythm, normal heart sounds and intact distal pulses.   No murmur heard. Respiratory: Effort normal and breath sounds normal. No respiratory distress. She has no wheezes. She exhibits no tenderness.  GI: She exhibits no distension and no mass. There is no tenderness.  Musculoskeletal:       Right hip: Normal.       Left hip: Normal.       Right knee: She exhibits normal range of motion and no erythema. Tenderness found. Medial joint line tenderness noted.       Left knee: She exhibits decreased range of motion and swelling. She exhibits no erythema. Tenderness found. Medial joint line tenderness noted.       Right lower leg: She exhibits no tenderness and no swelling.       Left lower leg: She exhibits no tenderness and no swelling.  Right knee mild genu varus. 0 to 125 degrees. Left knee mild genu varus with 10 to 115 degrees.   Lymphadenopathy:    She has no cervical adenopathy.  Neurological: She is alert and oriented to person, place, and time. She has normal strength and normal reflexes. No sensory deficit.  Skin: No rash noted. She is not diaphoretic. No erythema.  Psychiatric: She has a normal mood and affect. Her behavior is normal.    Vital signs in last 24 hours: Temp:  [97 F (36.1 C)] 97 F (36.1 C) (02/24 1020) Pulse Rate:  [74] 74 (02/24 1020) Resp:  [16] 16 (02/24 1020) BP: (144)/(85) 144/85 mmHg  (02/24 1020) SpO2:  [97 %] 97 % (02/24 1020) Weight:  [89.812 kg (198 lb)] 89.812 kg (198 lb) (02/24 1020)  Estimated body mass index is 31.28 kg/(m^2) as calculated from the following:   Height as of 11/28/11: 5' 6.5" (1.689 m).   Weight as of 11/28/11: 89.245 kg (196 lb 12 oz).   Imaging Review Plain radiographs demonstrate severe degenerative joint disease of the left knee(s). The overall alignment ismild varus. The bone quality appears to be good for age and reported activity level.  Assessment/Plan:  End stage arthritis, left knee   The patient history, physical examination, clinical judgment of the provider and imaging studies are consistent with end stage degenerative joint disease of the left knee(s) and total knee arthroplasty is deemed medically necessary. The treatment options including medical management, injection therapy arthroscopy and  arthroplasty were discussed at length. The risks and benefits of total knee arthroplasty were presented and reviewed. The risks due to aseptic loosening, infection, stiffness, patella tracking problems, thromboembolic complications and other imponderables were discussed. The patient acknowledged the explanation, agreed to proceed with the plan and consent was signed. Patient is being admitted for inpatient treatment for surgery, pain control, PT, OT, prophylactic antibiotics, VTE prophylaxis, progressive ambulation and ADL's and discharge planning. The patient is planning to be discharged to skilled nursing facility [facility]).      Dimitri Ped, PA-C

## 2012-03-23 ENCOUNTER — Encounter: Payer: Self-pay | Admitting: Internal Medicine

## 2012-03-23 ENCOUNTER — Ambulatory Visit (INDEPENDENT_AMBULATORY_CARE_PROVIDER_SITE_OTHER): Payer: Medicare Other | Admitting: Internal Medicine

## 2012-03-23 VITALS — BP 120/76 | HR 84 | Temp 97.6°F | Resp 16 | Wt 198.0 lb

## 2012-03-23 DIAGNOSIS — J322 Chronic ethmoidal sinusitis: Secondary | ICD-10-CM

## 2012-03-23 MED ORDER — CEFUROXIME AXETIL 500 MG PO TABS: 500.0000 mg | ORAL_TABLET | Freq: Two times a day (BID) | ORAL | Status: DC

## 2012-03-23 NOTE — Progress Notes (Signed)
  Subjective:    Patient ID: Nicole [name], female    DOB: December 12, 1940, 72 y.o.   MRN: [id number]  Sinusitis This is a new problem. The current episode started in the past 7 days. The problem has been rapidly worsening since onset. There has been no fever. Her pain is at a severity of 0/10. She is experiencing no pain. Associated symptoms include chills, congestion, sinus pressure, sneezing and a sore throat. Pertinent negatives include no coughing, diaphoresis, ear pain, headaches, hoarse voice, neck pain, shortness of breath or swollen glands. Past treatments include oral decongestants. The treatment provided mild relief.      Review of Systems  Constitutional: Positive for chills. Negative for fever, diaphoresis, activity change, appetite change, fatigue and unexpected weight change.  HENT: Positive for congestion, sore throat, rhinorrhea, sneezing, postnasal drip and sinus pressure. Negative for ear pain, nosebleeds, hoarse voice, facial swelling, trouble swallowing, neck pain and voice change.   Eyes: Negative.   Respiratory: Negative for cough, choking, shortness of breath, wheezing and stridor.   Cardiovascular: Negative.   Gastrointestinal: Negative.   Endocrine: Negative.   Genitourinary: Negative.   Musculoskeletal: Negative.   Allergic/Immunologic: Negative.   Neurological: Negative for dizziness, weakness, light-headedness and headaches.  Hematological: Negative for adenopathy. Does not bruise/bleed easily.  Psychiatric/Behavioral: Negative.        Objective:   Physical Exam  Vitals reviewed. Constitutional: She is oriented to person, place, and time. She appears well-developed and well-nourished.  Non-toxic appearance. She does not have a sickly appearance. She does not appear ill. No distress.  HENT:  Head: Normocephalic and atraumatic. No trismus in the jaw.  Right Ear: Hearing, tympanic membrane, external ear and ear canal normal.  Left Ear: Hearing, tympanic  membrane, external ear and ear canal normal.  Nose: Mucosal edema and rhinorrhea present. No epistaxis.  No foreign bodies. Right sinus exhibits maxillary sinus tenderness. Right sinus exhibits no frontal sinus tenderness. Left sinus exhibits maxillary sinus tenderness. Left sinus exhibits no frontal sinus tenderness.  Mouth/Throat: Mucous membranes are normal. Mucous membranes are not pale, not dry and not cyanotic. No oral lesions. No edematous. Posterior oropharyngeal erythema present. No oropharyngeal exudate, posterior oropharyngeal edema or tonsillar abscesses.  Eyes: Conjunctivae are normal. Right eye exhibits no discharge. Left eye exhibits no discharge. No scleral icterus.  Neck: Normal range of motion. Neck supple. No JVD present. No tracheal deviation present. No thyromegaly present.  Cardiovascular: Normal rate, regular rhythm and intact distal pulses.  Exam reveals no gallop and no friction rub.   No murmur heard. Pulmonary/Chest: Effort normal and breath sounds normal. No stridor. No respiratory distress. She has no wheezes. She has no rales. She exhibits no tenderness.  Abdominal: Soft. Bowel sounds are normal. She exhibits no distension and no mass. There is no tenderness. There is no rebound and no guarding.  Musculoskeletal: Normal range of motion. She exhibits no edema and no tenderness.  Lymphadenopathy:    She has no cervical adenopathy.  Neurological: She is oriented to person, place, and time.  Skin: Skin is warm and dry. No rash noted. She is not diaphoretic. No erythema. No pallor.  Psychiatric: She has a normal mood and affect. Her behavior is normal. Judgment and thought content normal.          Assessment & Plan:

## 2012-03-23 NOTE — Assessment & Plan Note (Signed)
Will treat the infection with ceftin 

## 2012-03-23 NOTE — Patient Instructions (Signed)

## 2012-03-26 ENCOUNTER — Ambulatory Visit: Payer: Medicare Other | Admitting: Internal Medicine

## 2012-03-26 NOTE — Progress Notes (Signed)
Pt called about surgery time change. Pt to arrive at short stay at 11 am. Pt verbalized understanding with no questions.

## 2012-03-27 NOTE — Progress Notes (Signed)
Received phone call from patient. She is confused about which medication to take 03/28/12 prior to surgery. RN reviews instructions. NPO after midnight. To take her metoprolol with small sip of water. She verbalizes understanding.

## 2012-03-28 ENCOUNTER — Inpatient Hospital Stay (HOSPITAL_COMMUNITY)
Admission: RE | Admit: 2012-03-28 | Discharge: 2012-03-31 | DRG: 470 | Disposition: A | Discharge status: Skilled Nursing Facility | Payer: Medicare Other | Source: Ambulatory Visit | Attending: Orthopedic Surgery | Admitting: Orthopedic Surgery

## 2012-03-28 ENCOUNTER — Encounter (HOSPITAL_COMMUNITY)
Admission: RE | Disposition: A | Discharge status: Skilled Nursing Facility | Payer: Self-pay | Source: Ambulatory Visit | Attending: Orthopedic Surgery

## 2012-03-28 ENCOUNTER — Inpatient Hospital Stay (HOSPITAL_COMMUNITY): Payer: Medicare Other | Admitting: Anesthesiology

## 2012-03-28 ENCOUNTER — Inpatient Hospital Stay (HOSPITAL_COMMUNITY): Payer: Medicare Other

## 2012-03-28 ENCOUNTER — Encounter (HOSPITAL_COMMUNITY): Payer: Self-pay | Admitting: *Deleted

## 2012-03-28 ENCOUNTER — Inpatient Hospital Stay (HOSPITAL_COMMUNITY): Admission: RE | Admit: 2012-03-28 | Payer: Medicare Other | Source: Ambulatory Visit | Admitting: Orthopedic Surgery

## 2012-03-28 ENCOUNTER — Encounter (HOSPITAL_COMMUNITY): Admission: RE | Payer: Self-pay | Source: Ambulatory Visit

## 2012-03-28 ENCOUNTER — Encounter (HOSPITAL_COMMUNITY): Payer: Self-pay | Admitting: Anesthesiology

## 2012-03-28 DIAGNOSIS — D62 Acute posthemorrhagic anemia: Secondary | ICD-10-CM | POA: Diagnosis not present

## 2012-03-28 DIAGNOSIS — I10 Essential (primary) hypertension: Secondary | ICD-10-CM | POA: Diagnosis present

## 2012-03-28 DIAGNOSIS — M1712 Unilateral primary osteoarthritis, left knee: Secondary | ICD-10-CM | POA: Diagnosis present

## 2012-03-28 DIAGNOSIS — E119 Type 2 diabetes mellitus without complications: Secondary | ICD-10-CM | POA: Diagnosis present

## 2012-03-28 DIAGNOSIS — Z8542 Personal history of malignant neoplasm of other parts of uterus: Secondary | ICD-10-CM

## 2012-03-28 DIAGNOSIS — Z96652 Presence of left artificial knee joint: Secondary | ICD-10-CM

## 2012-03-28 DIAGNOSIS — M1710 Unilateral primary osteoarthritis, unspecified knee: Principal | ICD-10-CM | POA: Diagnosis present

## 2012-03-28 HISTORY — PX: TOTAL KNEE ARTHROPLASTY: SHX125

## 2012-03-28 LAB — GLUCOSE, CAPILLARY
Glucose-Capillary: 124 mg/dL — ABNORMAL HIGH (ref 70–99)
Glucose-Capillary: 119 mg/dL — ABNORMAL HIGH (ref 70–99)
Glucose-Capillary: 151 mg/dL — ABNORMAL HIGH (ref 70–99)

## 2012-03-28 LAB — TYPE AND SCREEN
ABO/RH(D): O POS
Antibody Screen: NEGATIVE

## 2012-03-28 LAB — ABO/RH: ABO/RH(D): O POS

## 2012-03-28 SURGERY — ARTHROPLASTY, KNEE, TOTAL
Anesthesia: General | Site: Knee | Laterality: Left

## 2012-03-28 SURGERY — ARTHROPLASTY, KNEE, TOTAL
Anesthesia: General | Site: Knee | Laterality: Left | Wound class: Clean

## 2012-03-28 MED ORDER — HYDROMORPHONE HCL PF 1 MG/ML IJ SOLN
0.2500 mg | INTRAMUSCULAR | Status: DC | PRN
  Administered 2012-03-28 (×4): 0.5 mg via INTRAVENOUS

## 2012-03-28 MED ORDER — LOSARTAN POTASSIUM-HCTZ 100-25 MG PO TABS: 1.0000 | ORAL_TABLET | Freq: Every morning | ORAL | Status: DC

## 2012-03-28 MED ORDER — HYDROCODONE-ACETAMINOPHEN 5-325 MG PO TABS
1.0000 | ORAL_TABLET | ORAL | Status: DC | PRN
  Administered 2012-03-28: 2 via ORAL
  Administered 2012-03-29: 1 via ORAL
  Administered 2012-03-29: 2 via ORAL
  Administered 2012-03-29: 1 via ORAL
  Administered 2012-03-29 (×2): 2 via ORAL
  Administered 2012-03-31: 1 via ORAL
  Filled 2012-03-28 (×2): qty 2
  Filled 2012-03-28 (×3): qty 1
  Filled 2012-03-28 (×2): qty 2

## 2012-03-28 MED ORDER — BUPIVACAINE LIPOSOME 1.3 % IJ SUSP
20.0000 mL | Freq: Once | INTRAMUSCULAR | Status: DC
  Filled 2012-03-28: qty 20

## 2012-03-28 MED ORDER — LINAGLIPTIN 5 MG PO TABS
5.0000 mg | ORAL_TABLET | Freq: Every day | ORAL | Status: DC
  Administered 2012-03-29 – 2012-03-31 (×3): 5 mg via ORAL
  Filled 2012-03-28 (×5): qty 1

## 2012-03-28 MED ORDER — RIVAROXABAN 10 MG PO TABS
10.0000 mg | ORAL_TABLET | Freq: Every day | ORAL | Status: DC
  Administered 2012-03-29 – 2012-03-31 (×3): 10 mg via ORAL
  Filled 2012-03-28 (×5): qty 1

## 2012-03-28 MED ORDER — MEPERIDINE HCL 50 MG/ML IJ SOLN: 6.2500 mg | INTRAMUSCULAR | Status: DC | PRN

## 2012-03-28 MED ORDER — ALUM & MAG HYDROXIDE-SIMETH 200-200-20 MG/5ML PO SUSP: 30.0000 mL | ORAL | Status: DC | PRN

## 2012-03-28 MED ORDER — PROMETHAZINE HCL 25 MG/ML IJ SOLN: 6.2500 mg | INTRAMUSCULAR | Status: DC | PRN

## 2012-03-28 MED ORDER — METHOCARBAMOL 100 MG/ML IJ SOLN: 500.0000 mg | Freq: Four times a day (QID) | INTRAVENOUS | Status: DC | PRN

## 2012-03-28 MED ORDER — PHENYLEPHRINE HCL 10 MG/ML IJ SOLN
INTRAMUSCULAR | Status: DC | PRN
  Administered 2012-03-28 (×2): 40 ug via INTRAVENOUS

## 2012-03-28 MED ORDER — POLYETHYLENE GLYCOL 3350 17 G PO PACK: 17.0000 g | PACK | Freq: Every day | ORAL | Status: DC | PRN

## 2012-03-28 MED ORDER — LACTATED RINGERS IV SOLN
INTRAVENOUS | Status: DC
  Administered 2012-03-28: 1000 mL via INTRAVENOUS

## 2012-03-28 MED ORDER — FLEET ENEMA 7-19 GM/118ML RE ENEM: 1.0000 | ENEMA | Freq: Once | RECTAL | Status: AC | PRN

## 2012-03-28 MED ORDER — METOPROLOL SUCCINATE ER 50 MG PO TB24
50.0000 mg | ORAL_TABLET | Freq: Every morning | ORAL | Status: DC
Start: 2012-03-29 — End: 2012-03-31
  Administered 2012-03-29 – 2012-03-30 (×2): 50 mg via ORAL
  Filled 2012-03-28 (×3): qty 1

## 2012-03-28 MED ORDER — SITAGLIPTIN PHOS-METFORMIN HCL 50-500 MG PO TABS: 1.0000 | ORAL_TABLET | Freq: Two times a day (BID) | ORAL | Status: DC

## 2012-03-28 MED ORDER — SUCCINYLCHOLINE CHLORIDE 20 MG/ML IJ SOLN
INTRAMUSCULAR | Status: DC | PRN
  Administered 2012-03-28: 120 mg via INTRAVENOUS

## 2012-03-28 MED ORDER — METFORMIN HCL 500 MG PO TABS
500.0000 mg | ORAL_TABLET | Freq: Two times a day (BID) | ORAL | Status: DC
  Administered 2012-03-29 – 2012-03-31 (×5): 500 mg via ORAL
  Filled 2012-03-28 (×8): qty 1

## 2012-03-28 MED ORDER — HYDROMORPHONE HCL PF 1 MG/ML IJ SOLN
INTRAMUSCULAR | Status: DC | PRN
  Administered 2012-03-28 (×2): 0.5 mg via INTRAVENOUS

## 2012-03-28 MED ORDER — SODIUM CHLORIDE 0.9 % IR SOLN
Status: DC | PRN
  Administered 2012-03-28: 13:00:00

## 2012-03-28 MED ORDER — ONDANSETRON HCL 4 MG PO TABS: 4.0000 mg | ORAL_TABLET | Freq: Four times a day (QID) | ORAL | Status: DC | PRN

## 2012-03-28 MED ORDER — HYDROCHLOROTHIAZIDE 25 MG PO TABS
25.0000 mg | ORAL_TABLET | Freq: Every day | ORAL | Status: DC
  Administered 2012-03-30: 25 mg via ORAL
  Filled 2012-03-28 (×3): qty 1

## 2012-03-28 MED ORDER — ACETAMINOPHEN 650 MG RE SUPP: 650.0000 mg | Freq: Four times a day (QID) | RECTAL | Status: DC | PRN

## 2012-03-28 MED ORDER — ONDANSETRON HCL 4 MG/2ML IJ SOLN: 4.0000 mg | Freq: Four times a day (QID) | INTRAMUSCULAR | Status: DC | PRN

## 2012-03-28 MED ORDER — LACTATED RINGERS IV SOLN
INTRAVENOUS | Status: DC | PRN
  Administered 2012-03-28 (×2): via INTRAVENOUS

## 2012-03-28 MED ORDER — CEFAZOLIN SODIUM 1-5 GM-% IV SOLN
1.0000 g | Freq: Four times a day (QID) | INTRAVENOUS | Status: AC
  Administered 2012-03-28 – 2012-03-29 (×2): 1 g via INTRAVENOUS
  Filled 2012-03-28 (×2): qty 50

## 2012-03-28 MED ORDER — OXYCODONE-ACETAMINOPHEN 5-325 MG PO TABS
2.0000 | ORAL_TABLET | ORAL | Status: DC | PRN
  Administered 2012-03-29 – 2012-03-31 (×9): 2 via ORAL
  Filled 2012-03-28 (×9): qty 2

## 2012-03-28 MED ORDER — METHOCARBAMOL 500 MG PO TABS
500.0000 mg | ORAL_TABLET | Freq: Four times a day (QID) | ORAL | Status: DC | PRN
  Administered 2012-03-28 – 2012-03-31 (×6): 500 mg via ORAL
  Filled 2012-03-28 (×5): qty 1

## 2012-03-28 MED ORDER — LOSARTAN POTASSIUM 50 MG PO TABS
100.0000 mg | ORAL_TABLET | Freq: Every day | ORAL | Status: DC
  Administered 2012-03-30: 100 mg via ORAL
  Filled 2012-03-28 (×3): qty 2

## 2012-03-28 MED ORDER — THROMBIN 5000 UNITS EX SOLR
CUTANEOUS | Status: DC | PRN
  Administered 2012-03-28: 5000 [IU] via TOPICAL

## 2012-03-28 MED ORDER — FERROUS SULFATE 325 (65 FE) MG PO TABS
325.0000 mg | ORAL_TABLET | Freq: Three times a day (TID) | ORAL | Status: DC
  Administered 2012-03-29 – 2012-03-31 (×8): 325 mg via ORAL
  Filled 2012-03-28 (×11): qty 1

## 2012-03-28 MED ORDER — LACTATED RINGERS IV SOLN: INTRAVENOUS | Status: DC

## 2012-03-28 MED ORDER — SODIUM CHLORIDE 0.9 % IJ SOLN: Freq: Once | INTRAMUSCULAR | Status: DC

## 2012-03-28 MED ORDER — MIDAZOLAM HCL 5 MG/5ML IJ SOLN
INTRAMUSCULAR | Status: DC | PRN
  Administered 2012-03-28: 2 mg via INTRAVENOUS

## 2012-03-28 MED ORDER — LIDOCAINE HCL (CARDIAC) 20 MG/ML IV SOLN
INTRAVENOUS | Status: DC | PRN
  Administered 2012-03-28: 60 mg via INTRAVENOUS

## 2012-03-28 MED ORDER — INSULIN ASPART 100 UNIT/ML ~~LOC~~ SOLN
0.0000 [IU] | Freq: Three times a day (TID) | SUBCUTANEOUS | Status: DC
  Administered 2012-03-29 (×3): 2 [IU] via SUBCUTANEOUS
  Administered 2012-03-30: 3 [IU] via SUBCUTANEOUS
  Administered 2012-03-30: 2 [IU] via SUBCUTANEOUS
  Administered 2012-03-30: 3 [IU] via SUBCUTANEOUS
  Administered 2012-03-31 (×2): 2 [IU] via SUBCUTANEOUS

## 2012-03-28 MED ORDER — SODIUM CHLORIDE 0.9 % IJ SOLN
INTRAMUSCULAR | Status: DC | PRN
  Administered 2012-03-28: 14:00:00

## 2012-03-28 MED ORDER — SODIUM CHLORIDE 0.9 % IR SOLN
Status: DC | PRN
  Administered 2012-03-28: 3000 mL

## 2012-03-28 MED ORDER — ACETAMINOPHEN 10 MG/ML IV SOLN
INTRAVENOUS | Status: DC | PRN
  Administered 2012-03-28: 1000 mg via INTRAVENOUS

## 2012-03-28 MED ORDER — FENTANYL CITRATE 0.05 MG/ML IJ SOLN
INTRAMUSCULAR | Status: DC | PRN
  Administered 2012-03-28: 50 ug via INTRAVENOUS
  Administered 2012-03-28 (×2): 100 ug via INTRAVENOUS

## 2012-03-28 MED ORDER — CEFAZOLIN SODIUM-DEXTROSE 2-3 GM-% IV SOLR
2.0000 g | INTRAVENOUS | Status: AC
  Administered 2012-03-28: 2 g via INTRAVENOUS

## 2012-03-28 MED ORDER — BISACODYL 10 MG RE SUPP: 10.0000 mg | Freq: Every day | RECTAL | Status: DC | PRN

## 2012-03-28 MED ORDER — HYDROMORPHONE HCL PF 1 MG/ML IJ SOLN
1.0000 mg | INTRAMUSCULAR | Status: DC | PRN
  Administered 2012-03-28 – 2012-03-29 (×2): 1 mg via INTRAVENOUS
  Filled 2012-03-28 (×2): qty 1

## 2012-03-28 MED ORDER — LACTATED RINGERS IV SOLN
INTRAVENOUS | Status: DC
  Administered 2012-03-28 – 2012-03-29 (×2): via INTRAVENOUS

## 2012-03-28 MED ORDER — MENTHOL 3 MG MT LOZG
1.0000 | LOZENGE | OROMUCOSAL | Status: DC | PRN
  Filled 2012-03-28: qty 9

## 2012-03-28 MED ORDER — PROPOFOL 10 MG/ML IV BOLUS
INTRAVENOUS | Status: DC | PRN
  Administered 2012-03-28: 150 mg via INTRAVENOUS

## 2012-03-28 MED ORDER — PHENOL 1.4 % MT LIQD
1.0000 | OROMUCOSAL | Status: DC | PRN
  Filled 2012-03-28: qty 177

## 2012-03-28 MED ORDER — ACETAMINOPHEN 325 MG PO TABS
650.0000 mg | ORAL_TABLET | Freq: Four times a day (QID) | ORAL | Status: DC | PRN
  Administered 2012-03-29: 650 mg via ORAL
  Filled 2012-03-28: qty 2

## 2012-03-28 SURGICAL SUPPLY — 66 items
BAG SPEC THK2 15X12 ZIP CLS (MISCELLANEOUS) ×1
BAG ZIPLOCK 12X15 (MISCELLANEOUS) ×2 IMPLANT
BANDAGE ELASTIC 4 VELCRO ST LF (GAUZE/BANDAGES/DRESSINGS) ×2 IMPLANT
BANDAGE ELASTIC 6 VELCRO ST LF (GAUZE/BANDAGES/DRESSINGS) ×2 IMPLANT
BANDAGE ESMARK 6X9 LF (GAUZE/BANDAGES/DRESSINGS) ×1 IMPLANT
BANDAGE GAUZE ELAST BULKY 4 IN (GAUZE/BANDAGES/DRESSINGS) ×2 IMPLANT
BLADE SAG 18X100X1.27 (BLADE) ×2 IMPLANT
BLADE SAW SGTL 11.0X1.19X90.0M (BLADE) ×2 IMPLANT
BNDG CMPR 9X6 STRL LF SNTH (GAUZE/BANDAGES/DRESSINGS) ×1
BNDG ESMARK 6X9 LF (GAUZE/BANDAGES/DRESSINGS) ×2
BONE CEMENT GENTAMICIN (Cement) ×4 IMPLANT
CEMENT BONE GENTAMICIN 40 (Cement) ×2 IMPLANT
CLOTH BEACON ORANGE TIMEOUT ST (SAFETY) ×2 IMPLANT
CLSR STERI-STRIP ANTIMIC 1/2X4 (GAUZE/BANDAGES/DRESSINGS) ×3 IMPLANT
CUFF TOURN SGL QUICK 34 (TOURNIQUET CUFF) ×2
CUFF TRNQT CYL 34X4X40X1 (TOURNIQUET CUFF) ×1 IMPLANT
DRAPE EXTREMITY T 121X128X90 (DRAPE) ×2 IMPLANT
DRAPE INCISE IOBAN 66X45 STRL (DRAPES) ×2 IMPLANT
DRAPE LG THREE QUARTER DISP (DRAPES) ×2 IMPLANT
DRAPE POUCH INSTRU U-SHP 10X18 (DRAPES) ×2 IMPLANT
DRAPE U-SHAPE 47X51 STRL (DRAPES) ×2 IMPLANT
DRSG ADAPTIC 3X8 NADH LF (GAUZE/BANDAGES/DRESSINGS) ×2 IMPLANT
DRSG PAD ABDOMINAL 8X10 ST (GAUZE/BANDAGES/DRESSINGS) ×5 IMPLANT
DURAPREP 26ML APPLICATOR (WOUND CARE) ×2 IMPLANT
ELECT BLADE TIP CTD 4 INCH (ELECTRODE) ×1 IMPLANT
ELECT REM PT RETURN 9FT ADLT (ELECTROSURGICAL) ×2
ELECTRODE REM PT RTRN 9FT ADLT (ELECTROSURGICAL) ×1 IMPLANT
EVACUATOR 1/8 PVC DRAIN (DRAIN) ×2 IMPLANT
FACESHIELD LNG OPTICON STERILE (SAFETY) ×12 IMPLANT
GLOVE BIOGEL PI IND STRL 8 (GLOVE) ×1 IMPLANT
GLOVE BIOGEL PI IND STRL 8.5 (GLOVE) IMPLANT
GLOVE BIOGEL PI INDICATOR 8 (GLOVE) ×1
GLOVE BIOGEL PI INDICATOR 8.5 (GLOVE)
GLOVE ECLIPSE 8.0 STRL XLNG CF (GLOVE) ×4 IMPLANT
GLOVE SURG SS PI 6.5 STRL IVOR (GLOVE) ×5 IMPLANT
GOWN PREVENTION PLUS LG XLONG (DISPOSABLE) ×4 IMPLANT
GOWN STRL REIN XL XLG (GOWN DISPOSABLE) ×2 IMPLANT
HANDPIECE INTERPULSE COAX TIP (DISPOSABLE) ×2
IMMOBILIZER KNEE 20 (SOFTGOODS) ×2
IMMOBILIZER KNEE 20 THIGH 36 (SOFTGOODS) IMPLANT
KIT BASIN OR (CUSTOM PROCEDURE TRAY) ×2 IMPLANT
MANIFOLD NEPTUNE II (INSTRUMENTS) ×2 IMPLANT
NEEDLE HYPO 22GX1.5 SAFETY (NEEDLE) ×2 IMPLANT
NS IRRIG 1000ML POUR BTL (IV SOLUTION) ×2 IMPLANT
PACK TOTAL JOINT (CUSTOM PROCEDURE TRAY) ×2 IMPLANT
PLATE ROT INSERT 12.5MM SIZE 3 (Plate) IMPLANT
POSITIONER SURGICAL ARM (MISCELLANEOUS) ×2 IMPLANT
SET HNDPC FAN SPRY TIP SCT (DISPOSABLE) ×1 IMPLANT
SPONGE GAUZE 4X4 12PLY (GAUZE/BANDAGES/DRESSINGS) ×1 IMPLANT
SPONGE LAP 18X18 X RAY DECT (DISPOSABLE) ×2 IMPLANT
SPONGE SURGIFOAM ABS GEL 100 (HEMOSTASIS) ×2 IMPLANT
STAPLER VISISTAT 35W (STAPLE) IMPLANT
SUCTION FRAZIER 12FR DISP (SUCTIONS) ×2 IMPLANT
SUT BONE WAX W31G (SUTURE) ×2 IMPLANT
SUT MNCRL AB 4-0 PS2 18 (SUTURE) ×2 IMPLANT
SUT VIC AB 1 CT1 27 (SUTURE) ×4
SUT VIC AB 1 CT1 27XBRD ANTBC (SUTURE) ×2 IMPLANT
SUT VIC AB 2-0 CT1 27 (SUTURE) ×6
SUT VIC AB 2-0 CT1 TAPERPNT 27 (SUTURE) ×2 IMPLANT
SUT VLOC 180 0 24IN GS25 (SUTURE) ×2 IMPLANT
SYR 20CC LL (SYRINGE) ×2 IMPLANT
TOWEL OR 17X26 10 PK STRL BLUE (TOWEL DISPOSABLE) ×4 IMPLANT
TOWER CARTRIDGE SMART MIX (DISPOSABLE) ×2 IMPLANT
TRAY FOLEY CATH 14FRSI W/METER (CATHETERS) ×2 IMPLANT
WATER STERILE IRR 1500ML POUR (IV SOLUTION) ×2 IMPLANT
WRAP KNEE MAXI GEL POST OP (GAUZE/BANDAGES/DRESSINGS) ×4 IMPLANT

## 2012-03-28 NOTE — Brief Op Note (Signed)
03/28/2012  2:24 PM  PATIENT:  Nicole [name]  72 y.o. female  PRE-OPERATIVE DIAGNOSIS:  OA LEFT KNEE  POST-OPERATIVE DIAGNOSIS:  OA LEFT KNEE  PROCEDURE:  Procedure(s): LEFT TOTAL KNEE ARTHROPLASTY (Left)  SURGEON:  Surgeon(s) and Role:    * Jacki Cones, MD - Primary  PHYSICIAN ASSISTANT: Dimitri Ped PA  ASSISTANTS: Dimitri Ped PA   ANESTHESIA:   general  EBL:  Total I/O In: 1000 [I.V.:1000] Out: 325 [Urine:300; Blood:25]  BLOOD ADMINISTERED:none  DRAINS: (One) Hemovact drain(s) in the Left Knee with  Suction Open   LOCAL MEDICATIONS USED:  BUPIVICAINE 20cc mixed with 20cc Normal Saline.  SPECIMEN:  No Specimen  DISPOSITION OF SPECIMEN:  N/A  COUNTS:  YES  TOURNIQUET:   Total Tourniquet Time Documented: Thigh (Left) - 91 minutes Total: Thigh (Left) - 91 minutes   DICTATION: .Other Dictation: Dictation Number [id number]  PLAN OF CARE: Admit to inpatient   PATIENT DISPOSITION:  stable in OR   Delay start of Pharmacological VTE agent (>24hrs) due to surgical blood loss or risk of bleeding: yes

## 2012-03-28 NOTE — Preoperative (Signed)
Beta Blockers   Reason not to administer Beta Blockers:Not Applicable 

## 2012-03-28 NOTE — H&P (View-Only) (Signed)
Received phone call from patient. She is confused about which medication to take 03/28/12 prior to surgery. RN reviews instructions. NPO after midnight. To take her metoprolol with small sip of water. She verbalizes understanding. 

## 2012-03-28 NOTE — Interval H&P Note (Signed)
History and Physical Interval Note:  03/28/2012 11:55 AM  [name]  has presented today for surgery, with the diagnosis of OA LEFT KNEE  The various methods of treatment have been discussed with the patient and family. After consideration of risks, benefits and other options for treatment, the patient has consented to  Procedure(s): LEFT TOTAL KNEE ARTHROPLASTY (Left) as [name] surgical intervention .  The patient's history has been reviewed, patient examined, no change in status, stable for surgery.  I have reviewed the patient's chart and labs.  Questions were answered to the patient's satisfaction.     Nicole [name]

## 2012-03-28 NOTE — Anesthesia Postprocedure Evaluation (Signed)
Anesthesia Post Note  Patient: Nicole [name]  Procedure(s) Performed: Procedure(s) (LRB): LEFT TOTAL KNEE ARTHROPLASTY (Left)  Anesthesia type: General  Patient location: PACU  Post pain: Pain level controlled  Post assessment: Post-op Vital signs reviewed  Last Vitals: BP 137/79  Pulse 73  Temp(Src) 36.9 C (Oral)  Resp 16  SpO2 100%  Post vital signs: Reviewed  Level of consciousness: sedated  Complications: No apparent anesthesia complications

## 2012-03-28 NOTE — Transfer of Care (Signed)
Immediate Anesthesia Transfer of Care Note  Patient: Nicole [name]  Procedure(s) Performed: Procedure(s): LEFT TOTAL KNEE ARTHROPLASTY (Left)  Patient Location: PACU  Anesthesia Type:General  Level of Consciousness: awake, alert , oriented and patient cooperative  Airway & Oxygen Therapy: Patient Spontanous Breathing and Patient connected to face mask oxygen  Post-op Assessment: Report given to PACU RN and Post -op Vital signs reviewed and stable  Post vital signs: stable  Complications: No apparent anesthesia complications

## 2012-03-28 NOTE — Anesthesia Preprocedure Evaluation (Addendum)
Anesthesia Evaluation  Patient identified by MRN, date of birth, ID band Patient awake    Reviewed: Allergy & Precautions, H&P , NPO status , Patient's Chart, lab work & pertinent test results  Airway Mallampati: II TM Distance: >3 FB Neck ROM: Full    Dental no notable dental hx.    Pulmonary neg pulmonary ROS,  breath sounds clear to auscultation  Pulmonary exam normal       Cardiovascular hypertension, Pt. on medications negative cardio ROS  Rhythm:Regular Rate:Normal     Neuro/Psych negative neurological ROS  negative psych ROS   GI/Hepatic negative GI ROS, Neg liver ROS,   Endo/Other  negative endocrine ROSdiabetes, Type 2  Renal/GU negative Renal ROS  negative genitourinary   Musculoskeletal negative musculoskeletal ROS (+)   Abdominal   Peds negative pediatric ROS (+)  Hematology negative hematology ROS (+)   Anesthesia Other Findings   Reproductive/Obstetrics negative OB ROS                          Anesthesia Physical Anesthesia Plan  ASA: II  Anesthesia Plan: General   Post-op Pain Management:    Induction: Intravenous  Airway Management Planned: Oral ETT and LMA  Additional Equipment:   Intra-op Plan:   Post-operative Plan: Extubation in OR  Informed Consent: I have reviewed the patients History and Physical, chart, labs and discussed the procedure including the risks, benefits and alternatives for the proposed anesthesia with the patient or authorized representative who has indicated his/her understanding and acceptance.   Dental advisory given  Plan Discussed with: CRNA  Anesthesia Plan Comments:         Anesthesia Quick Evaluation

## 2012-03-29 ENCOUNTER — Encounter (HOSPITAL_COMMUNITY): Payer: Self-pay | Admitting: Orthopedic Surgery

## 2012-03-29 LAB — GLUCOSE, CAPILLARY
Glucose-Capillary: 148 mg/dL — ABNORMAL HIGH (ref 70–99)
Glucose-Capillary: 150 mg/dL — ABNORMAL HIGH (ref 70–99)
Glucose-Capillary: 133 mg/dL — ABNORMAL HIGH (ref 70–99)
Glucose-Capillary: 155 mg/dL — ABNORMAL HIGH (ref 70–99)

## 2012-03-29 LAB — CBC
HCT: 30.8 % — ABNORMAL LOW (ref 36.0–46.0)
Hemoglobin: 10.4 g/dL — ABNORMAL LOW (ref 12.0–15.0)
MCH: 29.4 pg (ref 26.0–34.0)
MCHC: 33.8 g/dL (ref 30.0–36.0)
MCV: 87 fL (ref 78.0–100.0)
Platelets: 195 10*3/uL (ref 150–400)
RBC: 3.54 MIL/uL — ABNORMAL LOW (ref 3.87–5.11)
RDW: 12.8 % (ref 11.5–15.5)
WBC: 6.9 10*3/uL (ref 4.0–10.5)

## 2012-03-29 LAB — BASIC METABOLIC PANEL
BUN: 14 mg/dL (ref 6–23)
CO2: 25 mEq/L (ref 19–32)
Calcium: 7.9 mg/dL — ABNORMAL LOW (ref 8.4–10.5)
Chloride: 96 mEq/L (ref 96–112)
Creatinine, Ser: 0.72 mg/dL (ref 0.50–1.10)
GFR calc Af Amer: >90 mL/min (ref >90)
GFR calc non Af Amer: 86 mL/min — ABNORMAL LOW (ref >90)
Glucose, Bld: 200 mg/dL — ABNORMAL HIGH (ref 70–99)
Potassium: 3.6 mEq/L (ref 3.5–5.1)
Sodium: 129 mEq/L — ABNORMAL LOW (ref 135–145)

## 2012-03-29 MED ORDER — TRAMADOL HCL 50 MG PO TABS
50.0000 mg | ORAL_TABLET | Freq: Four times a day (QID) | ORAL | Status: DC | PRN
  Administered 2012-03-29 (×2): 50 mg via ORAL
  Filled 2012-03-29 (×3): qty 1

## 2012-03-29 MED ORDER — RIVAROXABAN 10 MG PO TABS: 10.0000 mg | ORAL_TABLET | Freq: Every day | ORAL | Status: DC

## 2012-03-29 MED ORDER — BISACODYL 10 MG RE SUPP: 10.0000 mg | Freq: Every day | RECTAL | Status: DC | PRN

## 2012-03-29 MED ORDER — FERROUS SULFATE 325 (65 FE) MG PO TABS: 325.0000 mg | ORAL_TABLET | Freq: Three times a day (TID) | ORAL | Status: DC

## 2012-03-29 MED ORDER — DIPHENHYDRAMINE HCL 12.5 MG/5ML PO ELIX
12.5000 mg | ORAL_SOLUTION | Freq: Four times a day (QID) | ORAL | Status: DC | PRN
  Administered 2012-03-29 (×2): 12.5 mg via ORAL
  Filled 2012-03-29 (×2): qty 5

## 2012-03-29 MED ORDER — TRAMADOL HCL 50 MG PO TABS: 50.0000 mg | ORAL_TABLET | Freq: Four times a day (QID) | ORAL | Status: DC | PRN

## 2012-03-29 MED ORDER — METHOCARBAMOL 500 MG PO TABS: 500.0000 mg | ORAL_TABLET | Freq: Four times a day (QID) | ORAL | Status: DC | PRN

## 2012-03-29 NOTE — Evaluation (Signed)
Physical Therapy Evaluation Patient Details Name: Nicole [name] MRN: [id number] DOB: 02-May-1940 Today's Date: 03/29/2012 Time: 1020-1103 PT Time Calculation (min): 43 min  PT Assessment / Plan / Recommendation Clinical Impression  Pt s/p L TKR presents with decreased L LE strength/ROM and post op pain limiting functional mobility    PT Assessment  Patient needs continued PT services    Follow Up Recommendations  SNF    Does the patient have the potential to tolerate intense rehabilitation      Barriers to Discharge Decreased caregiver support      Equipment Recommendations  Rolling walker with 5" wheels    Recommendations for Other Services OT consult   Frequency 7X/week    Precautions / Restrictions Precautions Precautions: Fall;Knee Required Braces or Orthoses: Knee Immobilizer - Left Knee Immobilizer - Left: Discontinue once straight leg raise with < 10 degree lag Restrictions Weight Bearing Restrictions: No Other Position/Activity Restrictions: WBAT   Pertinent Vitals/Pain 4/10; premed, cold packs provided      Mobility  Bed Mobility Bed Mobility: Supine to Sit Supine to Sit: 1: +2 Total assist Supine to Sit: Patient Percentage: 60% Details for Bed Mobility Assistance: cues for sequence and use of R LE and UEs to self assist Transfers Transfers: Sit to Stand;Stand to Sit Sit to Stand: 1: +2 Total assist;From bed;From chair/3-in-1;With armrests;With upper extremity assist Sit to Stand: Patient Percentage: 60% Stand to Sit: 1: +2 Total assist;To chair/3-in-1;With armrests;With upper extremity assist Stand to Sit: Patient Percentage: 70% Details for Transfer Assistance: cues and assist for LE management and use of UEs for self assist Ambulation/Gait Ambulation/Gait Assistance: 1: +2 Total assist Ambulation/Gait: Patient Percentage: 60% Ambulation Distance (Feet): 25 Feet (twice) Assistive device: Rolling walker Ambulation/Gait Assistance Details: cues for  sequence, posture and position from RW Gait Pattern: Step-to pattern Stairs: No    Exercises Total Joint Exercises Ankle Circles/Pumps: AROM;10 reps;Supine;Both Quad Sets: AROM;10 reps;Supine;Both Heel Slides: AAROM;10 reps;Supine;Left Straight Leg Raises: AAROM;Left;10 reps;Supine   PT Diagnosis: Difficulty walking  PT Problem List: Decreased strength;Decreased range of motion;Decreased activity tolerance;Decreased mobility;Decreased knowledge of use of DME;Pain PT Treatment Interventions: DME instruction;Gait training;Stair training;Functional mobility training;Therapeutic activities;Therapeutic exercise;Patient/family education   PT Goals Acute Rehab PT Goals PT Goal Formulation: With patient Time For Goal Achievement: 04/03/12 Potential to Achieve Goals: Good Pt will go Supine/Side to Sit: with supervision PT Goal: Supine/Side to Sit - Progress: Goal set today Pt will go Sit to Supine/Side: with supervision PT Goal: Sit to Supine/Side - Progress: Goal set today Pt will go Sit to Stand: with supervision PT Goal: Sit to Stand - Progress: Goal set today Pt will go Stand to Sit: with supervision PT Goal: Stand to Sit - Progress: Goal set today Pt will Ambulate: 51 - 150 feet;with supervision;with rolling walker PT Goal: Ambulate - Progress: Goal set today  Visit Information  Last PT Received On: 03/29/12 Assistance Needed: +2    Subjective Data  Subjective: I want to move but I'm afraid it will hurt Patient Stated Goal: Rehab and home to resume previous lifestyle with decreased pain   Prior Functioning  Home Living Lives With: Spouse    Cognition  Cognition Overall Cognitive Status: Appears within functional limits for tasks assessed/performed Arousal/Alertness: Lethargic Orientation Level: Appears intact for tasks assessed Behavior During Session: [facility] for tasks performed    Extremity/Trunk Assessment Right Upper Extremity Assessment RUE ROM/Strength/Tone:  Deficits RUE ROM/Strength/Tone Deficits: Pt reporting premorbid wrist weakness Left Upper Extremity Assessment LUE ROM/Strength/Tone: [facility] for tasks  assessed Right Lower Extremity Assessment RLE ROM/Strength/Tone: [facility] for tasks assessed Left Lower Extremity Assessment LLE ROM/Strength/Tone: Deficits LLE ROM/Strength/Tone Deficits: 2/5 quads with AAROM at  knee -12 - 30 with muscle guarding   Balance    End of Session PT - End of Session Equipment Utilized During Treatment: Gait belt;Left knee immobilizer Activity Tolerance: Patient tolerated treatment well Patient left: in chair;with call bell/phone within reach Nurse Communication: Mobility status  GP     Amandalynn Pitz 03/29/2012, 12:46 PM

## 2012-03-29 NOTE — Progress Notes (Signed)
0623 patient complaints of itching and upper lip feeling swollen, no distress noted, A Contstable PA notified with orders received. Will continue to [employer]

## 2012-03-29 NOTE — Progress Notes (Signed)
Physical Therapy Treatment Patient Details Name: Nicole [name] MRN: [id number] DOB: [birth year] Today's Date: 03/29/2012 Time: 2130-8657 PT Time Calculation (min): 29 min  PT Assessment / Plan / Recommendation Comments on Treatment Session  To from bathroom    Follow Up Recommendations  SNF     Does the patient have the potential to tolerate intense rehabilitation     Barriers to Discharge        Equipment Recommendations  Rolling walker with 5" wheels    Recommendations for Other Services OT consult  Frequency 7X/week   Plan Discharge plan remains appropriate    Precautions / Restrictions Precautions Precautions: Fall;Knee Required Braces or Orthoses: Knee Immobilizer - Left Knee Immobilizer - Left: Discontinue once straight leg raise with < 10 degree lag Restrictions Weight Bearing Restrictions: No Other Position/Activity Restrictions: WBAT   Pertinent Vitals/Pain     Mobility  Bed Mobility Bed Mobility: Sit to Supine Sit to Supine: 3: Mod assist Details for Bed Mobility Assistance: cues for sequence and use of R LE and UEs to self assist Transfers Transfers: Sit to Stand;Stand to Sit Sit to Stand: 1: +2 Total assist;From chair/3-in-1;With armrests;With upper extremity assist Sit to Stand: Patient Percentage: 60% Stand to Sit: 1: +2 Total assist;To chair/3-in-1;With armrests;With upper extremity assist;To bed Stand to Sit: Patient Percentage: 70% Details for Transfer Assistance: cues and assist for LE management and use of UEs for self assist Ambulation/Gait Ambulation/Gait Assistance: 1: +2 Total assist Ambulation/Gait: Patient Percentage: 70% Ambulation Distance (Feet): 25 Feet (twice) Assistive device: Rolling walker;Right platform walker Ambulation/Gait Assistance Details: cues for posture, sequence, position from RW Gait Pattern: Step-to pattern Stairs: No    Exercises     PT Diagnosis:    PT Problem List:   PT Treatment Interventions:     PT  Goals Acute Rehab PT Goals PT Goal Formulation: With patient Time For Goal Achievement: 04/03/12 Potential to Achieve Goals: Good Pt will go Supine/Side to Sit: with supervision PT Goal: Supine/Side to Sit - Progress: Goal set today Pt will go Sit to Supine/Side: with supervision PT Goal: Sit to Supine/Side - Progress: Goal set today Pt will go Sit to Stand: with supervision PT Goal: Sit to Stand - Progress: Goal set today Pt will go Stand to Sit: with supervision PT Goal: Stand to Sit - Progress: Goal set today Pt will Ambulate: 51 - 150 feet;with supervision;with rolling walker PT Goal: Ambulate - Progress: Goal set today  Visit Information  Last PT Received On: 03/29/12 Assistance Needed: +2    Subjective Data  Subjective: I'm ready, thanks for letting me rest first Patient Stated Goal: Rehab and home to resume previous lifestyle with decreased pain   Cognition  Cognition Overall Cognitive Status: Appears within functional limits for tasks assessed/performed Arousal/Alertness: Awake/alert Orientation Level: Appears intact for tasks assessed Behavior During Session: [facility] for tasks performed    Balance     End of Session PT - End of Session Equipment Utilized During Treatment: Gait belt;Left knee immobilizer Activity Tolerance: Patient tolerated treatment well Patient left: with call bell/phone within reach;in bed Nurse Communication: Mobility status   GP     Jshaun Abernathy 03/29/2012, 4:21 PM

## 2012-03-29 NOTE — Progress Notes (Signed)
Utilization review completed.  

## 2012-03-29 NOTE — Progress Notes (Signed)
Clinical Social Work Department BRIEF PSYCHOSOCIAL ASSESSMENT 03/29/2012  Patient:  Nicole [name], Nicole [name]     Account Number:  [id number]     Admit date:  03/28/2012  Clinical Social Worker:  Candie Chroman  Date/Time:  03/29/2012 01:49 PM  Referred by:  Physician  Date Referred:  03/29/2012 Referred for  SNF Placement   Other Referral:   Interview type:  Patient Other interview type:    PSYCHOSOCIAL DATA Living Status:  HUSBAND Admitted from facility:   Level of care:   Primary support name:  Molly Maduro Primary support relationship to patient:  SPOUSE Degree of support available:   unclear    CURRENT CONCERNS Current Concerns  Post-Acute Placement   Other Concerns:    SOCIAL WORK ASSESSMENT / PLAN Pt is a 72 yr old female living at home prior to hospitalization. CSW met with pt to assist with d/c planning . Pt has made prior arrangements to have ST Rehab at [facility] following hospital d/c. CSW has contacted SNF and d/c plans have been confirmed. CSW will continue to follow to assist with d/c planning to SNF. CSW will request prior authorization from [facility] for SNF placement.   Assessment/plan status:  Psychosocial Support/Ongoing Assessment of Needs Other assessment/ plan:   Information/referral to community resources:   None needed at this time.    PATIENT'S/FAMILY'S RESPONSE TO PLAN OF CARE: Pt is looking forward to having rehab at [facility].

## 2012-03-29 NOTE — Progress Notes (Signed)
Patient evaluated for long-term disease management services with [facility] Care Management Program as a benefit of her [employer]. Disposition likely SNF at discharge. Therefore, [facility] Care Management will not engage at this time. Patient was working with physical therapy upon visit.   Raiford Noble, MSN-Ed, RN,BSN, [facility], [phone]

## 2012-03-29 NOTE — Op Note (Signed)
NAMETIPHANIE, VO                ACCOUNT NO.:  [id number]  MEDICAL RECORD NO.:  [id number]  LOCATION:  1610                         FACILITY:  [facility]  PHYSICIAN:  Georges Lynch. Annalysa Mohammad, M.D.DATE OF BIRTH:  [birth year]  DATE OF PROCEDURE:  03/28/2012 DATE OF DISCHARGE:                              OPERATIVE REPORT   SURGEON:  Georges Lynch. Kelden Lavallee, M.D.  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  OPERATION:  Left total knee arthroplasty.  I utilized the [employer] system.  All 3 components were cemented.  The sizes used was a size 3, left femoral component.  Tibia was a size 2.5 tray with a __________ thickness rotating platform insert.  The patella with a size 38.  PROCEDURE:  Under general anesthesia, routine orthopedic prep and drape of the left lower extremity was carried out.  The appropriate time-out was first carried out.  I also marked the appropriate left leg in the holding area.  At this time, the leg was exsanguinated and Esmarch tourniquet was elevated at 325 mmHg.  The knee then was flexed.  An anterior approach of the knee was carried out.  Two flaps were created. I then carried out a median parapatellar approach, reflected the patella laterally.  Following that, I then did flex the knee and did medial and lateral meniscectomies and excised the anterior and posterior cruciate ligament.  The spurs were also removed from the femur and tibia.  After this, initial drill hole was made in the intercondylar notch in the femur.  At this time, the guide rod was inserted up the canal finder to make sure we were in the canal.  We thoroughly irrigated out the knee, and I removed 12-mm thickness off the distal femur in the usual fashion utilizing the intramedullary  guide.  After doing that, we measured the femur to be a size 3 and did our anterior, posterior, and chamfering cuts after we removed the distal femur as mentioned above.  Following that, we then went down and prepared the tibia.  We did soft tissue releases __________ varus.  I then measured the tibia to be a size 2.5. Initial drill hole was made in the tibia.  We thoroughly irrigated out the area.  The intramedullary guide rod was inserted.  At this time, we then removed about 6-mm thickness off the affected medial side.  We then utilized our spacer blocks and finally selected a 15-mm thickness insert.  At that particular time, we then went on and completed the preparation of the tibia.  We cut our keel cut the tibia in the usual fashion.  We then cut our notch __________ distal femur.  The trial components were inserted.  We went through a 10 mm, 12.5 mm, through a 15-mm thickness insert, which we selected.  Initially, the 12.5, felt very stable, but after we completed the procedure, we elected to go ahead on with the 15-mm thickness insert.  We then prepared the patella by doing __________ patella.  Three drill holes  were made in the patella in the usual fashion for a size 38 patella.  All trial components were removed.  All trial components thoroughly water picked out the knee and cemented all 3 components simultaneously.  After the cement was hardened, we removed the loose pieces of cement.  I water picked out the posterior compartment to make sure there was no other pieces of cement present.  Once again, I went back and tried a 12.5-mm thickness.  It was rather loose  __________, so I finally selected a 15-mm thickness insert.  Note, this was a rotating platform, 15-mm thickness size. Thoroughly irrigated out the area and then injected a mixture of 20 mL of Exparel and 20 mL of normal saline in the soft tissue.  I used __________ at the midway point and then the other half at the end  of the procedure.  After we made sure there were no other loose pieces of cement, we then inserted our permanent insert after I applied some thrombin-soaked Gelfoam posteriorly and medially.  We then reduced the knee.  We had good mediolateral stability, good flexion and extension. Thoroughly irrigated out the area and then inserted a Hemovac drain and closed the knee __________ in usual fashion.          ______________________________ Georges Lynch Darrelyn Hillock, M.D.     RAG/MEDQ  D:  03/28/2012  T:  03/29/2012  Job:  [id number]

## 2012-03-29 NOTE — Progress Notes (Signed)
Subjective: 1 Day Post-Op Procedure(s) (LRB): LEFT TOTAL KNEE ARTHROPLASTY (Left) Patient reports pain as 3 on 0-10 scale. Doing very well. Hemovac Dcd.Will ambulate and DC Saturday.   Objective: Vital signs in last 24 hours: Temp:  [97.6 F (36.4 C)-98.6 F (37 C)] 98.6 F (37 C) (03/06 0520) Pulse Rate:  [68-90] 76 (03/06 0520) Resp:  [10-20] 14 (03/06 0520) BP: (109-156)/(70-95) 109/70 mmHg (03/06 0520) SpO2:  [90 %-100 %] 96 % (03/06 0520) Weight:  [89.812 kg (198 lb)-89.9 kg (198 lb 3.1 oz)] 89.9 kg (198 lb 3.1 oz) (03/05 1700)  Intake/Output from previous day: 03/05 0701 - 03/06 0700 In: 3650 [I.V.:3550; IV Piggyback:100] Out: 1642 [Urine:1585; Drains:7; Blood:50] Intake/Output this shift:     Recent Labs  03/29/12 0419  HGB 10.4*    Recent Labs  03/29/12 0419  WBC 6.9  RBC 3.54*  HCT 30.8*  PLT 195    Recent Labs  03/29/12 0419  NA 129*  K 3.6  CL 96  CO2 25  BUN 14  CREATININE 0.72  GLUCOSE 200*  CALCIUM 7.9*   No results found for this basename: LABPT, INR,  in the last 72 hours  Neurologically intact Dorsiflexion/Plantar flexion intact  Assessment/Plan: 1 Day Post-Op Procedure(s) (LRB): LEFT TOTAL KNEE ARTHROPLASTY (Left) Up with therapy  Kerstyn Coryell A 03/29/2012, 7:17 AM

## 2012-03-29 NOTE — Care Management Note (Signed)
    Page 1 of 1   03/29/2012     4:22:02 PM   CARE MANAGEMENT NOTE 03/29/2012  Patient:  Nicole [name], Nicole [name]   Account Number:  [id number]  Date Initiated:  03/29/2012  Documentation initiated by:  Colleen Can  Subjective/Objective Assessment:   dx osteoarthritis left knee; total knee replacemnt     Action/Plan:   SNF rehab   Anticipated DC Date:  03/31/2012   Anticipated DC Plan:  SKILLED NURSING FACILITY  In-house referral  Clinical Social Worker      DC Planning Services  CM consult      Choice offered to / List presented to:             Status of service:  Completed, signed off Medicare Important Message given?  NA - LOS <3 / Initial given by admissions (If response is "NO", the following Medicare IM given date fields will be blank) Date Medicare IM given:   Date Additional Medicare IM given:    Discharge Disposition:    Per UR Regulation:    If discussed at Long Length of Stay Meetings, dates discussed:    Comments:

## 2012-03-30 DIAGNOSIS — D62 Acute posthemorrhagic anemia: Secondary | ICD-10-CM | POA: Diagnosis not present

## 2012-03-30 LAB — BASIC METABOLIC PANEL
BUN: 10 mg/dL (ref 6–23)
CO2: 25 mEq/L (ref 19–32)
Calcium: 8.5 mg/dL (ref 8.4–10.5)
Chloride: 101 mEq/L (ref 96–112)
Creatinine, Ser: 0.77 mg/dL (ref 0.50–1.10)
GFR calc Af Amer: >90 mL/min (ref >90)
GFR calc non Af Amer: 84 mL/min — ABNORMAL LOW (ref >90)
Glucose, Bld: 166 mg/dL — ABNORMAL HIGH (ref 70–99)
Potassium: 3.6 mEq/L (ref 3.5–5.1)
Sodium: 135 mEq/L (ref 135–145)

## 2012-03-30 LAB — CBC
HCT: 31.3 % — ABNORMAL LOW (ref 36.0–46.0)
Hemoglobin: 10.5 g/dL — ABNORMAL LOW (ref 12.0–15.0)
MCH: 29.2 pg (ref 26.0–34.0)
MCHC: 33.5 g/dL (ref 30.0–36.0)
MCV: 87.2 fL (ref 78.0–100.0)
Platelets: 183 10*3/uL (ref 150–400)
RBC: 3.59 MIL/uL — ABNORMAL LOW (ref 3.87–5.11)
RDW: 12.9 % (ref 11.5–15.5)
WBC: 10.5 10*3/uL (ref 4.0–10.5)

## 2012-03-30 LAB — GLUCOSE, CAPILLARY
Glucose-Capillary: 156 mg/dL — ABNORMAL HIGH (ref 70–99)
Glucose-Capillary: 139 mg/dL — ABNORMAL HIGH (ref 70–99)
Glucose-Capillary: 161 mg/dL — ABNORMAL HIGH (ref 70–99)
Glucose-Capillary: 133 mg/dL — ABNORMAL HIGH (ref 70–99)

## 2012-03-30 MED ORDER — OXYCODONE-ACETAMINOPHEN 5-325 MG PO TABS: 2.0000 | ORAL_TABLET | ORAL | Status: DC | PRN

## 2012-03-30 NOTE — Progress Notes (Signed)
   Subjective: 2 Days Post-Op Procedure(s) (LRB): LEFT TOTAL KNEE ARTHROPLASTY (Left) Patient reports pain as moderate.   Patient seen in rounds without Dr. Darrelyn Hillock. Patient is well, and has had no acute complaints or problems. She reports that she is doing much better today than yesterday. She had some issues with pain medication yesterday. Hydrocodone caused itching and swelling of lips. She was switched to Tramadol which did not relieve pain. She tried Percocet which has relieved pain. No issues with shortness of breath or chest pain.  Plan is to go Skilled nursing facility after hospital stay.  Objective: Vital signs in last 24 hours: Temp:  [98.2 F (36.8 C)-99.1 F (37.3 C)] 98.7 F (37.1 C) (03/07 0450) Pulse Rate:  [86-100] 100 (03/07 0450) Resp:  [16] 16 (03/07 0450) BP: (106-135)/(51-78) 106/69 mmHg (03/07 0450) SpO2:  [92 %-97 %] 95 % (03/07 0450)  Intake/Output from previous day:  Intake/Output Summary (Last 24 hours) at 03/30/12 0738 Last data filed at 03/30/12 0435  Gross per 24 hour  Intake    720 ml  Output   4050 ml  Net  -3330 ml     Labs:  Recent Labs  03/29/12 0419 03/30/12 0415  HGB 10.4* 10.5*    Recent Labs  03/29/12 0419 03/30/12 0415  WBC 6.9 10.5  RBC 3.54* 3.59*  HCT 30.8* 31.3*  PLT 195 183    Recent Labs  03/29/12 0419 03/30/12 0415  NA 129* 135  K 3.6 3.6  CL 96 101  CO2 25 25  BUN 14 10  CREATININE 0.72 0.77  GLUCOSE 200* 166*  CALCIUM 7.9* 8.5    EXAM General - Patient is Alert and Oriented Extremity - Neurologically intact Neurovascular intact Dorsiflexion/Plantar flexion intact Incision: no drainage Dressing/Incision - clean, dry, healing Motor Function - intact, moving foot and toes well on exam.   Past Medical History  Diagnosis Date  . Hypertension   . Low back pain   . Osteoarthritis   . Diabetes mellitus     type 2  . Bronchitis     hx of  . Cancer 2004    uterine/cervical     Assessment/Plan: 2 Days Post-Op Procedure(s) (LRB): LEFT TOTAL KNEE ARTHROPLASTY (Left) Active Problems:   Osteoarthritis of left knee  Estimated body mass index is 31.85 kg/(m^2) as calculated from the following:   Height as of this encounter: 5' 6.14" (1.68 m).   Weight as of this encounter: 89.9 kg (198 lb 3.1 oz). Advance diet Up with therapy Plan for discharge tomorrow to SNF  DVT Prophylaxis - Xarelto Weight-Bearing as tolerated to left leg  Will continue therapy today. Will DC to [employer]. Will recheck labs in the morning.   Nicole [name] LAUREN 03/30/2012, 7:38 AM

## 2012-03-30 NOTE — Progress Notes (Signed)
Physical Therapy Treatment Patient Details Name: Nicole [name] MRN: [id number] DOB: 10-Mar-1940 Today's Date: 03/30/2012 Time: 1332-1410 PT Time Calculation (min): 38 min  PT Assessment / Plan / Recommendation Comments on Treatment Session  Marked improvement in activity tolerance vs am session    Follow Up Recommendations  SNF     Does the patient have the potential to tolerate intense rehabilitation     Barriers to Discharge        Equipment Recommendations  Rolling walker with 5" wheels    Recommendations for Other Services OT consult  Frequency 7X/week   Plan Discharge plan remains appropriate    Precautions / Restrictions Precautions Precautions: Fall;Knee Required Braces or Orthoses: Knee Immobilizer - Left Knee Immobilizer - Left: Discontinue once straight leg raise with < 10 degree lag Restrictions Weight Bearing Restrictions: No Other Position/Activity Restrictions: WBAT   Pertinent Vitals/Pain     Mobility  Bed Mobility Bed Mobility: Supine to Sit;Sit to Supine Supine to Sit: 3: Mod assist Sit to Supine: 3: Mod assist Details for Bed Mobility Assistance: cues for sequence and use of R LE and UEs to self assist Transfers Transfers: Sit to Stand;Stand to Sit Sit to Stand: 1: +2 Total assist;From bed;With armrests Sit to Stand: Patient Percentage: 60% Stand to Sit: 3: Mod assist;To bed;With upper extremity assist Details for Transfer Assistance: cues for hand placement, LLE support during transition. Ambulation/Gait Ambulation/Gait Assistance: 1: +2 Total assist Ambulation/Gait: Patient Percentage: 70% Ambulation Distance (Feet): 39 Feet (twice) Assistive device: Rolling walker;Right platform walker Ambulation/Gait Assistance Details: cues for posture, sequence, stride length, and position from RW Gait Pattern: Step-to pattern;Antalgic Stairs: No    Exercises Total Joint Exercises Ankle Circles/Pumps: AROM;Supine;Both;20 reps Quad Sets:  AROM;Supine;Both;20 reps Heel Slides: AAROM;10 reps;Supine;Left Straight Leg Raises: AAROM;Left;Supine;20 reps   PT Diagnosis:    PT Problem List:   PT Treatment Interventions:     PT Goals Acute Rehab PT Goals PT Goal Formulation: With patient Time For Goal Achievement: 04/03/12 Potential to Achieve Goals: Good Pt will go Supine/Side to Sit: with supervision PT Goal: Supine/Side to Sit - Progress: Progressing toward goal Pt will go Sit to Supine/Side: with supervision PT Goal: Sit to Supine/Side - Progress: Progressing toward goal Pt will go Sit to Stand: with supervision PT Goal: Sit to Stand - Progress: Progressing toward goal Pt will go Stand to Sit: with supervision PT Goal: Stand to Sit - Progress: Progressing toward goal Pt will Ambulate: 51 - 150 feet;with supervision;with rolling walker PT Goal: Ambulate - Progress: Progressing toward goal  Visit Information  Last PT Received On: 03/30/12 Assistance Needed: +2    Subjective Data  Subjective: I did much better this afternoon Patient Stated Goal: Rehab and home to resume previous lifestyle with decreased pain   Cognition  Cognition Overall Cognitive Status: Appears within functional limits for tasks assessed/performed Arousal/Alertness: Awake/alert Orientation Level: Appears intact for tasks assessed Behavior During Session: [facility] for tasks performed    Balance     End of Session PT - End of Session Equipment Utilized During Treatment: Left knee immobilizer Activity Tolerance: Patient tolerated treatment well Patient left: with call bell/phone within reach;in bed;with family/visitor present Nurse Communication: Mobility status   GP     Miyuki Rzasa 03/30/2012, 4:00 PM

## 2012-03-30 NOTE — Progress Notes (Signed)
Physical Therapy Treatment Patient Details Name: Nicole [name] MRN: [id number] DOB: 1940-11-08 Today's Date: 03/30/2012 Time: 1012-1032 PT Time Calculation (min): 20 min  PT Assessment / Plan / Recommendation Comments on Treatment Session  Pt. has difficulty advancing non surgical leg, placing weight through Platform, tried a few steps w/ PF then without.     Follow Up Recommendations  SNF     Does the patient have the potential to tolerate intense rehabilitation     Barriers to Discharge        Equipment Recommendations  Rolling walker with 5" wheels    Recommendations for Other Services    Frequency 7X/week   Plan Discharge plan remains appropriate    Precautions / Restrictions Precautions Precautions: Fall;Knee Required Braces or Orthoses: Knee Immobilizer - Left Knee Immobilizer - Left: Discontinue once straight leg raise with < 10 degree lag Restrictions Weight Bearing Restrictions: No Other Position/Activity Restrictions: WBAT   Pertinent Vitals/Pain     Mobility  Bed Mobility Sit to Supine: 3: Mod assist Details for Bed Mobility Assistance: cues for sequence and use of R LE and UEs to self assist Transfers Transfers: Stand Pivot Transfers Sit to Stand: 3: Mod assist;From chair/3-in-1;With upper extremity assist;With armrests Stand to Sit: 3: Mod assist;To chair/3-in-1;To bed;With upper extremity assist;With armrests Stand to Sit: Patient Percentage: 70% Stand Pivot Transfers: 1: +2 Total assist Stand Pivot Transfers: Patient Percentage: 70% Details for Transfer Assistance: cues for hand placement, LLE support during transition. Ambulation/Gait Ambulation/Gait Assistance: 1: +2 Total assist Ambulation/Gait: Patient Percentage: 70% Ambulation Distance (Feet): 10 Feet Ambulation/Gait Assistance Details: cues for posture. Pt provided with shorter RW and did not use Platform. still has difficulty with ambulation. Gait Pattern: Step-to pattern;Antalgic     Exercises     PT Diagnosis:    PT Problem List:   PT Treatment Interventions:     PT Goals Acute Rehab PT Goals Pt will go Sit to Supine/Side: with supervision PT Goal: Sit to Supine/Side - Progress: Progressing toward goal Pt will go Sit to Stand: with supervision PT Goal: Sit to Stand - Progress: Progressing toward goal Pt will go Stand to Sit: with supervision PT Goal: Stand to Sit - Progress: Progressing toward goal Pt will Ambulate: 51 - 150 feet;with supervision;with rolling walker PT Goal: Ambulate - Progress: Progressing toward goal  Visit Information  Last PT Received On: 03/30/12 Assistance Needed: +2    Subjective Data  Subjective: My arms are not strong.   Cognition  Cognition Overall Cognitive Status: Appears within functional limits for tasks assessed/performed Arousal/Alertness: Awake/alert Orientation Level: Appears intact for tasks assessed Behavior During Session: Nicole [name] for tasks performed    Balance     End of Session PT - End of Session Equipment Utilized During Treatment: Left knee immobilizer Activity Tolerance: Patient limited by fatigue Patient left: with call bell/phone within reach;in bed;with family/visitor present Nurse Communication: Mobility status   GP     Rada Hay 03/30/2012, 11:24 AM

## 2012-03-30 NOTE — Discharge Summary (Signed)
Physician Discharge Summary   Patient ID: Nicole [name] MRN: [id number] DOB/AGE: 03/24/1940 72 y.o.  Admit date: 03/28/2012 Discharge date: 03/31/2012  Primary Diagnosis: Osteoarthritis, left knee   Admission Diagnoses:  Past Medical History  Diagnosis Date  . Hypertension   . Low back pain   . Osteoarthritis   . Diabetes mellitus     type 2  . Bronchitis     hx of  . Cancer 2004    uterine/cervical   Discharge Diagnoses:   Active Problems:   Osteoarthritis of left knee S/p left total knee arthroplasty  Estimated body mass index is 31.85 kg/(m^2) as calculated from the following:   Height as of this encounter: 5' 6.14" (1.68 m).   Weight as of this encounter: 89.9 kg (198 lb 3.1 oz).  Procedure:  Procedure(s) (LRB): LEFT TOTAL KNEE ARTHROPLASTY (Left)   Consults: None  HPI: Nicole [name], 72 y.o. female, has a history of pain and functional disability in the left knee due to arthritis and has failed non-surgical conservative treatments for greater than 12 weeks to includeNSAID's and/or analgesics, corticosteriod injections and activity modification. Onset of symptoms was gradual, starting 2 years ago with gradually worsening course since that time. The patient noted prior procedures on the knee to include arthroscopy on the left knee(s). Patient currently rates pain in the left knee(s) at 7 out of 10 with activity. Patient has night pain, worsening of pain with activity and weight bearing, pain that interferes with activities of daily living, pain with passive range of motion, crepitus and joint swelling. Patient has evidence of periarticular osteophytes, joint subluxation and joint space narrowing by imaging studies. There is no active infection  Laboratory Data: Admission on 03/28/2012  Component Date Value Range Status  . ABO/RH(D) 03/28/2012 O POS   Final  . Antibody Screen 03/28/2012 NEG   Final  . Sample Expiration 03/28/2012 03/31/2012   Final  . Glucose-Capillary  03/28/2012 124* 70 - 99 mg/dL Final  . ABO/RH(D) 78/29/5621 O POS   Final  . Glucose-Capillary 03/28/2012 119* 70 - 99 mg/dL Final  . Comment 1 30/86/5784 Documented in Chart   Final  . Comment 2 03/28/2012 Notify RN   Final  . WBC 03/29/2012 6.9  4.0 - 10.5 K/uL Final  . RBC 03/29/2012 3.54* 3.87 - 5.11 MIL/uL Final  . Hemoglobin 03/29/2012 10.4* 12.0 - 15.0 g/dL Final  . HCT 69/62/9528 30.8* 36.0 - 46.0 % Final  . MCV 03/29/2012 87.0  78.0 - 100.0 fL Final  . MCH 03/29/2012 29.4  26.0 - 34.0 pg Final  . MCHC 03/29/2012 33.8  30.0 - 36.0 g/dL Final  . RDW 41/32/4401 12.8  11.5 - 15.5 % Final  . Platelets 03/29/2012 195  150 - 400 K/uL Final  . Sodium 03/29/2012 129* 135 - 145 mEq/L Final  . Potassium 03/29/2012 3.6  3.5 - 5.1 mEq/L Final  . Chloride 03/29/2012 96  96 - 112 mEq/L Final  . CO2 03/29/2012 25  19 - 32 mEq/L Final  . Glucose, Bld 03/29/2012 200* 70 - 99 mg/dL Final  . BUN 02/72/5366 14  6 - 23 mg/dL Final  . Creatinine, Ser 03/29/2012 0.72  0.50 - 1.10 mg/dL Final  . Calcium 44/03/4740 7.9* 8.4 - 10.5 mg/dL Final  . GFR calc non Af Amer 03/29/2012 86* >90 mL/min Final  . GFR calc Af Amer 03/29/2012 >90  >90 mL/min Final   Comment:  The eGFR has been calculated                          using the CKD EPI equation.                          This calculation has not been                          validated in all clinical                          situations.                          eGFR's persistently                          <90 mL/min signify                          possible Chronic Kidney Disease.  . Glucose-Capillary 03/28/2012 151* 70 - 99 mg/dL Final  . Comment 1 16/10/9602 Notify RN   Final  . Glucose-Capillary 03/29/2012 148* 70 - 99 mg/dL Final  . Comment 1 54/09/8117 Notify RN   Final  . Comment 2 03/29/2012 Documented in Chart   Final  . Glucose-Capillary 03/29/2012 150* 70 - 99 mg/dL Final  . Comment 1 14/78/2956 Notify RN    Final  . Comment 2 03/29/2012 Documented in Chart   Final  . WBC 03/30/2012 10.5  4.0 - 10.5 K/uL Final  . RBC 03/30/2012 3.59* 3.87 - 5.11 MIL/uL Final  . Hemoglobin 03/30/2012 10.5* 12.0 - 15.0 g/dL Final  . HCT 21/30/8657 31.3* 36.0 - 46.0 % Final  . MCV 03/30/2012 87.2  78.0 - 100.0 fL Final  . MCH 03/30/2012 29.2  26.0 - 34.0 pg Final  . MCHC 03/30/2012 33.5  30.0 - 36.0 g/dL Final  . RDW 84/69/6295 12.9  11.5 - 15.5 % Final  . Platelets 03/30/2012 183  150 - 400 K/uL Final  . Sodium 03/30/2012 135  135 - 145 mEq/L Final  . Potassium 03/30/2012 3.6  3.5 - 5.1 mEq/L Final  . Chloride 03/30/2012 101  96 - 112 mEq/L Final  . CO2 03/30/2012 25  19 - 32 mEq/L Final  . Glucose, Bld 03/30/2012 166* 70 - 99 mg/dL Final  . BUN 28/41/3244 10  6 - 23 mg/dL Final  . Creatinine, Ser 03/30/2012 0.77  0.50 - 1.10 mg/dL Final  . Calcium 01/26/7251 8.5  8.4 - 10.5 mg/dL Final  . GFR calc non Af Amer 03/30/2012 84* >90 mL/min Final  . GFR calc Af Amer 03/30/2012 >90  >90 mL/min Final   Comment:                                 The eGFR has been calculated                          using the CKD EPI equation.                          This calculation has not been  validated in all clinical                          situations.                          eGFR's persistently                          <90 mL/min signify                          possible Chronic Kidney Disease.  . Glucose-Capillary 03/29/2012 133* 70 - 99 mg/dL Final  . Comment 1 16/10/9602 Notify RN   Final  . Comment 2 03/29/2012 Documented in Chart   Final  . Glucose-Capillary 03/29/2012 155* 70 - 99 mg/dL Final  . Glucose-Capillary 03/30/2012 156* 70 - 99 mg/dL Final  Hospital Outpatient Visit on 03/19/2012  Component Date Value Range Status  . MRSA, PCR 03/19/2012 NEGATIVE  NEGATIVE Final  . Staphylococcus aureus 03/19/2012 NEGATIVE  NEGATIVE Final   Comment:                                 The Xpert SA  Assay (FDA                          approved for NASAL specimens                          in patients over 69 years of age),                          is one component of                          a comprehensive surveillance                          program.  Test performance has                          been validated by [employer] for patients greater                          than or equal to 54 year old.                          It is not intended                          to diagnose infection nor to                          guide or monitor treatment.  . WBC 03/19/2012 5.7  4.0 - 10.5 K/uL Final  . RBC 03/19/2012 4.59  3.87 - 5.11 MIL/uL Final  . Hemoglobin 03/19/2012 13.3  12.0 - 15.0 g/dL Final  . HCT 54/09/8117 40.5  36.0 - 46.0 % Final  . MCV 03/19/2012 88.2  78.0 - 100.0 fL Final  . MCH 03/19/2012 29.0  26.0 - 34.0 pg Final  . MCHC 03/19/2012 32.8  30.0 - 36.0 g/dL Final  . RDW 40/98/1191 13.2  11.5 - 15.5 % Final  . Platelets 03/19/2012 224  150 - 400 K/uL Final  . aPTT 03/19/2012 31  24 - 37 seconds Final  . Sodium 03/19/2012 136  135 - 145 mEq/L Final  . Potassium 03/19/2012 3.5  3.5 - 5.1 mEq/L Final  . Chloride 03/19/2012 100  96 - 112 mEq/L Final  . CO2 03/19/2012 26  19 - 32 mEq/L Final  . Glucose, Bld 03/19/2012 141* 70 - 99 mg/dL Final  . BUN 47/82/9562 17  6 - 23 mg/dL Final  . Creatinine, Ser 03/19/2012 0.76  0.50 - 1.10 mg/dL Final  . Calcium 13/08/6576 9.6  8.4 - 10.5 mg/dL Final  . Total Protein 03/19/2012 7.2  6.0 - 8.3 g/dL Final  . Albumin 46/96/2952 3.4* 3.5 - 5.2 g/dL Final  . AST 84/13/2440 19  0 - 37 U/L Final  . ALT 03/19/2012 21  0 - 35 U/L Final  . Alkaline Phosphatase 03/19/2012 54  39 - 117 U/L Final  . Total Bilirubin 03/19/2012 0.5  0.3 - 1.2 mg/dL Final  . GFR calc non Af Amer 03/19/2012 84* >90 mL/min Final  . GFR calc Af Amer 03/19/2012 >90  >90 mL/min Final   Comment:                                 The eGFR has  been calculated                          using the CKD EPI equation.                          This calculation has not been                          validated in all clinical                          situations.                          eGFR's persistently                          <90 mL/min signify                          possible Chronic Kidney Disease.  [name] Prothrombin Time 03/19/2012 12.2  11.6 - 15.2 seconds Final  . INR 03/19/2012 0.91  0.00 - 1.49 Final  . Color, Urine 03/19/2012 YELLOW  YELLOW Final  . APPearance 03/19/2012 CLEAR  CLEAR Final  . Specific Gravity, Urine 03/19/2012 1.020  1.005 - 1.030 Final  . pH 03/19/2012 6.0  5.0 - 8.0 Final  . Glucose, UA 03/19/2012 250* NEGATIVE mg/dL Final  . Hgb urine dipstick 03/19/2012 NEGATIVE  NEGATIVE Final  . Bilirubin Urine 03/19/2012 NEGATIVE  NEGATIVE Final  . Ketones, ur 03/19/2012 NEGATIVE  NEGATIVE mg/dL Final  . Protein, ur 11/20/2534 NEGATIVE  NEGATIVE mg/dL  Final  . Urobilinogen, UA 03/19/2012 0.2  0.0 - 1.0 mg/dL Final  . Nitrite 28/41/3244 NEGATIVE  NEGATIVE Final  . Leukocytes, UA 03/19/2012 MODERATE* NEGATIVE Final  . Squamous Epithelial / LPF 03/19/2012 RARE  RARE Final  . WBC, UA 03/19/2012 0-2  <3 WBC/hpf Final  . Bacteria, UA 03/19/2012 RARE  RARE Final  . Urine-Other 03/19/2012 MUCOUS PRESENT   Final     X-Rays:Dg Chest 2 View  03/19/2012  *RADIOLOGY REPORT*  Clinical Data: Preoperative evaluation.  Left knee replacement.  CHEST - 2 VIEW  Comparison: 04/01/2010.  Findings:  Cardiopericardial silhouette within normal limits. Mediastinal contours normal. Trachea midline.  No airspace disease or effusion.  IMPRESSION: No active cardiopulmonary disease.   Original Report Authenticated By: Andreas Newport, M.D.    Dg Knee Left Port  03/28/2012  *RADIOLOGY REPORT*  Clinical Data: Post left total knee replacement  PORTABLE LEFT KNEE - 1-2 VIEW  Comparison: Portable exam 1509 hours compared to 12/07/2010  Findings:  Components of a left knee prosthesis are identified in expected positions. No acute fracture, dislocation or periprosthetic lucency. Surgical drain identified anterolaterally. Postsurgical soft tissue changes noted. Bones appear demineralized.  IMPRESSION: Left knee prosthesis without acute complication.   Original Report Authenticated By: Ulyses Southward, M.D.     EKG: Orders placed during the hospital encounter of 03/19/12  . EKG 12-LEAD  . EKG 12-LEAD     Hospital Course: Nicole [name] is a 72 y.o. who was admitted to [facility]. They were brought to the operating room on 03/28/2012 and underwent Procedure(s): LEFT TOTAL KNEE ARTHROPLASTY.  Patient tolerated the procedure well and was later transferred to the recovery room and then to the orthopaedic floor for postoperative care.  They were given PO and IV analgesics for pain control following their surgery.  They were given 24 hours of postoperative antibiotics of  Anti-infectives   Start     Dose/Rate Route Frequency Ordered Stop   03/28/12 2000  ceFAZolin (ANCEF) IVPB 1 g/50 mL premix     1 g 100 mL/hr over 30 Minutes Intravenous Every 6 hours 03/28/12 1832 03/29/12 0154   03/28/12 1305  polymyxin B 500,000 Units, bacitracin 50,000 Units in sodium chloride irrigation 0.9 % 500 mL irrigation  Status:  Discontinued       As needed 03/28/12 1305 03/28/12 1444   03/28/12 0909  ceFAZolin (ANCEF) IVPB 2 g/50 mL premix     2 g 100 mL/hr over 30 Minutes Intravenous On call to O.R. 03/28/12 0909 03/28/12 1229     and started on DVT prophylaxis in the form of Xarelto.   PT and OT were ordered for total joint protocol.  Discharge planning consulted to help with postop disposition and equipment needs.  Patient had a decent night on the evening of surgery.  They started to get up OOB with therapy on day one. Hemovac drain was pulled without difficulty. She had some difficulty with hydrocodone causing itching and swollen lips. Tramadol did not  manage pain but she responded well to Percocet. Continued to work with therapy into day two.  Dressing was changed on day two and the incision was clean and dry.  By day three, the patient had progressed with therapy and meeting their goals.  Incision was healing well.  Patient was seen in rounds and was ready to go home.   Discharge Medications: Prior to Admission medications   Medication Sig Start Date End Date Taking? Authorizing Provider  hydrocortisone cream  1 % Apply 1 application topically 2 (two) times daily as needed (itching).   Yes Historical Provider, MD  losartan-hydrochlorothiazide (HYZAAR) 100-25 MG per tablet Take 1 tablet by mouth every morning.   Yes Historical Provider, MD  metoprolol succinate (TOPROL-XL) 50 MG 24 hr tablet Take 50 mg by mouth every morning. Take with or immediately following a meal.   Yes Historical Provider, MD  sitaGLIPtan-metformin (JANUMET) 50-500 MG per tablet Take 1 tablet by mouth 2 (two) times daily with a meal. 07/19/10  Yes Etta Grandchild, MD  bisacodyl (DULCOLAX) 10 MG suppository Place 1 suppository (10 mg total) rectally daily as needed. 03/29/12   Keliah Harned Tamala Ser, PA-C  ferrous sulfate 325 (65 FE) MG tablet Take 1 tablet (325 mg total) by mouth 3 (three) times daily after meals. 03/29/12   Mellony Danziger Tamala Ser, PA-C  FREESTYLE LITE test strip  11/08/10   Historical Provider, MD  FREESTYLE LITE test strip TEST once daily as directed 12/19/11   Etta Grandchild, MD  methocarbamol (ROBAXIN) 500 MG tablet Take 1 tablet (500 mg total) by mouth every 6 (six) hours as needed. 03/29/12   Laverne Klugh Tamala Ser, PA-C  oxyCODONE-acetaminophen (PERCOCET/ROXICET) 5-325 MG per tablet Take 2 tablets by mouth every 4 (four) hours as needed. 03/30/12   Faizah Kandler Tamala Ser, PA-C  rivaroxaban (XARELTO) 10 MG TABS tablet Take 1 tablet (10 mg total) by mouth daily with breakfast. 03/29/12   Benson Porcaro Tamala Ser, PA-C  traMADol (ULTRAM) 50 MG tablet Take 1-2 tablets  (50-100 mg total) by mouth every 6 (six) hours as needed. 03/29/12   Brityn Mastrogiovanni Tamala Ser, PA-C    Diet: Diabetic diet Activity:WBAT Follow-up:in 2 weeks Disposition - Skilled nursing facility Discharged Condition: fair   Discharge Orders   Future Orders Complete By Expires     Call MD / Call 911  As directed     Comments:      If you experience chest pain or shortness of breath, CALL 911 and be transported to the hospital emergency room.  If you develope a fever above 101 F, pus (white drainage) or increased drainage or redness at the wound, or calf pain, call your surgeon's office.    Change dressing  As directed     Comments:      Change dressing daily with sterile 4 x 4 inch gauze dressing    Constipation Prevention  As directed     Comments:      Drink plenty of fluids.  Prune juice may be helpful.  You may use a stool softener, such as Colace (over the counter) 100 mg twice a day.  Use MiraLax (over the counter) for constipation as needed.    Diet Carb Modified  As directed     Discharge instructions  As directed     Comments:      Walk with your walker. Weight bearing as instructed. Change your dressing daily. Shower only, no tub bath. Call if any temperatures greater than 101 or any wound complications: [phone] during the day and ask for Dr. Jeannetta Ellis nurse, Mackey Birchwood. You may resume vitamins and supplements after three week course of Xarelto    Do not put a pillow under the knee. Place it under the heel.  As directed     Driving restrictions  As directed     Comments:      No driving    Increase activity slowly as tolerated  As directed  Medication List    STOP taking these medications       cefUROXime 500 MG tablet  Commonly known as:  CEFTIN     Co Q 10 100 MG Caps     HORSE CHESTNUT PO     OSTEO BI-FLEX ADV TRIPLE ST PO     OVER THE COUNTER MEDICATION     vitamin B-12 500 MCG tablet  Commonly known as:  CYANOCOBALAMIN      TAKE these  medications       bisacodyl 10 MG suppository  Commonly known as:  DULCOLAX  Place 1 suppository (10 mg total) rectally daily as needed.     ferrous sulfate 325 (65 FE) MG tablet  Take 1 tablet (325 mg total) by mouth 3 (three) times daily after meals.     FREESTYLE LITE test strip  Generic drug:  glucose blood     FREESTYLE LITE test strip  Generic drug:  glucose blood  TEST once daily as directed     hydrocortisone cream 1 %  Apply 1 application topically 2 (two) times daily as needed (itching).     losartan-hydrochlorothiazide 100-25 MG per tablet  Commonly known as:  HYZAAR  Take 1 tablet by mouth every morning.     methocarbamol 500 MG tablet  Commonly known as:  ROBAXIN  Take 1 tablet (500 mg total) by mouth every 6 (six) hours as needed.     metoprolol succinate 50 MG 24 hr tablet  Commonly known as:  TOPROL-XL  Take 50 mg by mouth every morning. Take with or immediately following a meal.     oxyCODONE-acetaminophen 5-325 MG per tablet  Commonly known as:  PERCOCET/ROXICET  Take 2 tablets by mouth every 4 (four) hours as needed.     rivaroxaban 10 MG Tabs tablet  Commonly known as:  XARELTO  Take 1 tablet (10 mg total) by mouth daily with breakfast.     sitaGLIPtan-metformin 50-500 MG per tablet  Commonly known as:  JANUMET  Take 1 tablet by mouth 2 (two) times daily with a meal.     traMADol 50 MG tablet  Commonly known as:  ULTRAM  Take 1-2 tablets (50-100 mg total) by mouth every 6 (six) hours as needed.           Follow-up Information   Follow up with GIOFFRE,RONALD A, MD. Schedule an appointment as soon as possible for a visit in 2 weeks.   Contact information:   [address], [place] [address] [place] [address] [phone]       Signed: Kerby Nora 03/30/2012, 7:45 AM

## 2012-03-30 NOTE — Evaluation (Signed)
Occupational Therapy Evaluation Patient Details Name: Nicole [name] MRN: [id number] DOB: 1940/04/04 Today's Date: 03/30/2012 Time: 1478-2956 OT Time Calculation (min): 21 min  OT Assessment / Plan / Recommendation Clinical Impression  This 72 year old female was admitted for L TKA.  She will benefit from continued OT to increase safety and independence with adls.      OT Assessment  Patient needs continued OT Services    Follow Up Recommendations  SNF    Barriers to Discharge      Equipment Recommendations  3 in 1 bedside comode    Recommendations for Other Services    Frequency  Min 2X/week    Precautions / Restrictions Precautions Precautions: Fall;Knee Required Braces or Orthoses: Knee Immobilizer - Left Knee Immobilizer - Left: Discontinue once straight leg raise with < 10 degree lag Restrictions Weight Bearing Restrictions: No Other Position/Activity Restrictions: WBAT   Pertinent Vitals/Pain L knee moderate:  Requested pain medication and repositioned in bed    ADL  Grooming: Wash/dry hands;Set up Where Assessed - Grooming: Unsupported sitting Upper Body Bathing: Set up Where Assessed - Upper Body Bathing: Unsupported sitting Lower Body Bathing: Minimal assistance Where Assessed - Lower Body Bathing: Supported sit to stand Upper Body Dressing: Set up Where Assessed - Upper Body Dressing: Unsupported sitting Lower Body Dressing: Moderate assistance Where Assessed - Lower Body Dressing: Supported sit to Pharmacist, hospital: +2 Total assistance Toilet Transfer: Patient Percentage: 70% Statistician Method: Sit to Barista: Materials engineer and Hygiene: Moderate assistance (hygiene set up sitting on 3:1) Where Assessed - Toileting Clothing Manipulation and Hygiene: Sit to stand from 3-in-1 or toilet Equipment Used: Rolling walker Transfers/Ambulation Related to ADLs: Began to walk to bathroom but pt  with pain in bil UEs.  initially used platform walker but pt had difficulty advancing RLE--+2 for safety.   ADL Comments: Pt had difficulty managing underwear due to her dress getting in the way. Cued for one hand on RW for safety during clothing management    OT Diagnosis: Generalized weakness  OT Problem List: Decreased strength;Decreased activity tolerance;Decreased knowledge of use of DME or AE;Pain OT Treatment Interventions: Self-care/ADL training;DME and/or AE instruction;Patient/family education   OT Goals Acute Rehab OT Goals OT Goal Formulation: With patient Time For Goal Achievement: 04/06/12 Potential to Achieve Goals: Good ADL Goals Pt Will Perform Grooming: with min assist;Standing at sink (min guard) ADL Goal: Grooming - Progress: Goal set today Pt Will Transfer to Toilet: with min assist;Ambulation;3-in-1 ADL Goal: Toilet Transfer - Progress: Goal set today Pt Will Perform Toileting - Clothing Manipulation: with min assist;Sitting on 3-in-1 or toilet;Standing ADL Goal: Toileting - Clothing Manipulation - Progress: Goal set today  Visit Information  Last OT Received On: 03/30/12 Assistance Needed: +2 PT/OT Co-Evaluation/Treatment: Yes    Subjective Data  Subjective: I don't have to go to the bathroom.  I haven't been drinking much [name]   Prior Functioning     Home Living Lives With: Spouse Communication Communication: No difficulties         Vision/Perception     Cognition  Cognition Overall Cognitive Status: Appears within functional limits for tasks assessed/performed Arousal/Alertness: Awake/alert Orientation Level: Appears intact for tasks assessed Behavior During Session: [facility] for tasks performed    Extremity/Trunk Assessment Right Upper Extremity Assessment RUE ROM/Strength/Tone: Deficits RUE ROM/Strength/Tone Deficits: strength grossly 3+/5.  c/o wrist weakness Left Upper Extremity Assessment LUE  ROM/Strength/Tone: Deficits LUE  ROM/Strength/Tone Deficits: strength grossly 3+/5     Mobility Transfers Sit to Stand: 3: Mod assist;From chair/3-in-1;With upper extremity assist;With armrests Details for Transfer Assistance: cues for hand placement     Exercise     Balance     End of Session OT - End of Session Activity Tolerance: Patient limited by fatigue;Patient limited by pain Patient left: in bed;with call bell/phone within reach;with family/visitor present  GO     Seith Aikey 03/30/2012, 10:59 AM Marica Otter, OTR/L [phone] 03/30/2012

## 2012-03-30 NOTE — Care Management (Signed)
[facility] will follow this pt post skilled stay      Nicole [name]

## 2012-03-30 NOTE — Plan of Care (Signed)
Problem: Consults Goal: Diagnosis- Total Joint Replacement Outcome: Completed/Met Date Met:  03/30/12 Primary Total Knee LEFT

## 2012-03-31 LAB — CBC
HCT: 27.7 % — ABNORMAL LOW (ref 36.0–46.0)
Hemoglobin: 9.4 g/dL — ABNORMAL LOW (ref 12.0–15.0)
MCH: 29.8 pg (ref 26.0–34.0)
MCHC: 33.9 g/dL (ref 30.0–36.0)
MCV: 87.9 fL (ref 78.0–100.0)
Platelets: 194 10*3/uL (ref 150–400)
RBC: 3.15 MIL/uL — ABNORMAL LOW (ref 3.87–5.11)
RDW: 13.2 % (ref 11.5–15.5)
WBC: 10.2 10*3/uL (ref 4.0–10.5)

## 2012-03-31 LAB — GLUCOSE, CAPILLARY
Glucose-Capillary: 143 mg/dL — ABNORMAL HIGH (ref 70–99)
Glucose-Capillary: 132 mg/dL — ABNORMAL HIGH (ref 70–99)

## 2012-03-31 NOTE — Progress Notes (Signed)
Report called to Thelma Barge, Charity fundraiser at [facility].

## 2012-03-31 NOTE — Progress Notes (Signed)
Pt transported to [facility] via [place]...all paperwork sent.

## 2012-03-31 NOTE — Progress Notes (Signed)
   Subjective: 3 Days Post-Op Procedure(s) (LRB): LEFT TOTAL KNEE ARTHROPLASTY (Left)   Patient reports pain as mild, pain well controlled with medication. Feels that the pain is getting better every day. No events throughout the night. States that her plan is to eventually be discharged to SNF.  Objective:   VITALS:   Filed Vitals:   03/31/12  BP: 100/63  Pulse: 100  Temp: 98.2 F (36.8 C)   Resp: 16    Neurovascular intact Dorsiflexion/Plantar flexion intact Incision: dressing C/D/I No cellulitis present Compartment soft  LABS  Recent Labs  03/29/12 0419 03/30/12 0415 03/31/12 0415  HGB 10.4* 10.5* 9.4*  HCT 30.8* 31.3* 27.7*  WBC 6.9 10.5 10.2  PLT 195 183 194     Recent Labs  03/29/12 0419 03/30/12 0415  NA 129* 135  K 3.6 3.6  BUN 14 10  CREATININE 0.72 0.77  GLUCOSE 200* 166*     Assessment/Plan: 3 Days Post-Op Procedure(s) (LRB): LEFT TOTAL KNEE ARTHROPLASTY (Left) Up with therapy Discharge to SNF eventually when ready   Anastasio Auerbach. Henry Utsey   PAC  03/31/2012, 12:10 PM

## 2012-03-31 NOTE — Progress Notes (Signed)
Physical Therapy Treatment Patient Details Name: Nicole [name] MRN: [id number] DOB: 1940/02/16 Today's Date: 03/31/2012 Time: 4098-1191 PT Time Calculation (min): 35 min  PT Assessment / Plan / Recommendation Comments on Treatment Session       Follow Up Recommendations  SNF     Does the patient have the potential to tolerate intense rehabilitation     Barriers to Discharge        Equipment Recommendations  Rolling walker with 5" wheels    Recommendations for Other Services OT consult  Frequency 7X/week   Plan Discharge plan remains appropriate    Precautions / Restrictions Precautions Precautions: Fall;Knee Required Braces or Orthoses: Knee Immobilizer - Left Knee Immobilizer - Left: Discontinue once straight leg raise with < 10 degree lag Restrictions Weight Bearing Restrictions: No Other Position/Activity Restrictions: WBAT   Pertinent Vitals/Pain 4/10; premed, cold packs provided    Mobility  Bed Mobility Bed Mobility: Sit to Supine Sit to Supine: 4: Min assist Details for Bed Mobility Assistance: cues for sequence and use of R LE and UEs to self assist Transfers Transfers: Sit to Stand;Stand to Sit Sit to Stand: 4: Min assist;3: Mod assist Stand to Sit: 4: Min assist Details for Transfer Assistance: cues for hand placement, LLE support during transition. Ambulation/Gait Ambulation/Gait Assistance: 4: Min assist;3: Mod assist Ambulation Distance (Feet): 25 Feet (twice) Assistive device: Rolling walker;Right platform walker Ambulation/Gait Assistance Details: cues for posture, sequence, BOS, and step to gait Gait Pattern: Step-to pattern;Antalgic    Exercises Total Joint Exercises Ankle Circles/Pumps: AROM;Supine;Both;20 reps Quad Sets: AROM;Supine;Both;20 reps Heel Slides: AAROM;Supine;Left;20 reps Straight Leg Raises: AAROM;Left;Supine;20 reps   PT Diagnosis:    PT Problem List:   PT Treatment Interventions:     PT Goals Acute Rehab PT Goals PT  Goal Formulation: With patient Time For Goal Achievement: 04/03/12 Potential to Achieve Goals: Good Pt will go Supine/Side to Sit: with supervision PT Goal: Supine/Side to Sit - Progress: Progressing toward goal Pt will go Sit to Supine/Side: with supervision PT Goal: Sit to Supine/Side - Progress: Progressing toward goal Pt will go Sit to Stand: with supervision PT Goal: Sit to Stand - Progress: Progressing toward goal Pt will go Stand to Sit: with supervision PT Goal: Stand to Sit - Progress: Progressing toward goal Pt will Ambulate: 51 - 150 feet;with supervision;with rolling walker PT Goal: Ambulate - Progress: Progressing toward goal  Visit Information  Last PT Received On: 03/31/12 Assistance Needed: +1    Subjective Data  Subjective: I need to use the bathroom Patient Stated Goal: Rehab and home to resume previous lifestyle with decreased pain   Cognition  Cognition Overall Cognitive Status: Appears within functional limits for tasks assessed/performed Arousal/Alertness: Awake/alert Orientation Level: Appears intact for tasks assessed Behavior During Session: [facility] for tasks performed    Balance     End of Session PT - End of Session Equipment Utilized During Treatment: Left knee immobilizer Activity Tolerance: Patient tolerated treatment well Patient left: with call bell/phone within reach;in bed;with family/visitor present Nurse Communication: Mobility status   GP     Gennie Dib 03/31/2012, 2:14 PM

## 2012-03-31 NOTE — Progress Notes (Signed)
Per MD, Pt ready for d/c.   Notified RN, Pt, family and facility.   D/c summary sent to facility yesterday by Unit CSW.   Confirmed receipt of d/c summary.   Facility able to receive Pt after 1400 today.   Arranged for transportation.   Providence Crosby, LCSWA  Clinical Social Work  [phone]

## 2012-04-02 NOTE — Progress Notes (Signed)
Discharge summary sent to payer through [facility]  

## 2012-04-11 ENCOUNTER — Other Ambulatory Visit: Payer: Self-pay | Admitting: *Deleted

## 2012-04-11 MED ORDER — OXYCODONE-ACETAMINOPHEN 5-325 MG PO TABS: 2.0000 | ORAL_TABLET | ORAL | Status: DC | PRN

## 2012-04-17 ENCOUNTER — Encounter: Payer: Self-pay | Admitting: Adult Health

## 2012-04-17 ENCOUNTER — Non-Acute Institutional Stay (SKILLED_NURSING_FACILITY): Payer: Medicare Other | Admitting: Adult Health

## 2012-04-17 DIAGNOSIS — I10 Essential (primary) hypertension: Secondary | ICD-10-CM

## 2012-04-17 DIAGNOSIS — K5900 Constipation, unspecified: Secondary | ICD-10-CM

## 2012-04-17 DIAGNOSIS — M1710 Unilateral primary osteoarthritis, unspecified knee: Secondary | ICD-10-CM

## 2012-04-17 DIAGNOSIS — M1712 Unilateral primary osteoarthritis, left knee: Secondary | ICD-10-CM

## 2012-04-17 DIAGNOSIS — IMO0002 Reserved for concepts with insufficient information to code with codable children: Secondary | ICD-10-CM

## 2012-04-17 DIAGNOSIS — D62 Acute posthemorrhagic anemia: Secondary | ICD-10-CM

## 2012-04-17 DIAGNOSIS — M1990 Unspecified osteoarthritis, unspecified site: Secondary | ICD-10-CM

## 2012-04-17 DIAGNOSIS — IMO0001 Reserved for inherently not codable concepts without codable children: Secondary | ICD-10-CM

## 2012-04-17 NOTE — Progress Notes (Signed)
  Subjective:    Patient ID: Nicole [name] Overall, female    DOB: Jul 27, 1940, 72 y.o.   MRN: [id number]  HPI This is a 72 year old Asian-American female who is for discharge home with Home health PT and OT. DME: 3-in-1 commode. 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 She has been admitted to [facility] for a short-term rehabilitation.  Review of Systems  Constitutional: Negative.   HENT: Negative.   Eyes: Negative.   Respiratory: Negative for chest tightness and shortness of breath.   Cardiovascular: Negative for chest pain, palpitations and leg swelling.  Gastrointestinal: Negative.   Endocrine: Negative for polydipsia and polyphagia.  Genitourinary: Negative.   Musculoskeletal: Positive for gait problem.  Skin: Negative.   Neurological: Negative.   Hematological: Negative for adenopathy. Does not bruise/bleed easily.  Psychiatric/Behavioral: Negative.        Objective:   Physical Exam  Constitutional: She is oriented to person, place, and time. She appears well-developed and well-nourished.  HENT:  Head: Normocephalic.  Right Ear: External ear normal.  Left Ear: External ear normal.  Eyes: Conjunctivae are normal. Pupils are equal, round, and reactive to light.  Neck: Normal range of motion. Neck supple.  Cardiovascular: Normal rate, regular rhythm, normal heart sounds and intact distal pulses.   Pulmonary/Chest: Effort normal and breath sounds normal.  Abdominal: Soft. Bowel sounds are normal. She exhibits no distension. There is no tenderness.  Musculoskeletal: Normal range of motion. She exhibits no edema and no tenderness.  Neurological: She is alert and oriented to person, place, and time.  Skin: Skin is warm.  Psychiatric: She has a normal mood and affect. Her behavior is normal. Judgment and thought content normal.  LABS: 3/14  Wbc 6.8  hgb 8.6  hct 26.0  Bmp nl except glucose  195        Assessment & Plan:  Type II or unspecified type diabetes mellitus without mention of complication, uncontrolled - stable.  HYPERTENSION - well-controlled  OSTEOARTHRITIS S/P Left TKA - will be followed-up by Home health PT and OT  Postoperative anemia due to acute blood loss -stable  CONSTIPATION - stable.

## 2012-04-23 ENCOUNTER — Other Ambulatory Visit: Payer: Self-pay | Admitting: *Deleted

## 2012-04-23 MED ORDER — OXYCODONE-ACETAMINOPHEN 5-325 MG PO TABS: ORAL_TABLET | ORAL | Status: DC

## 2012-04-29 ENCOUNTER — Other Ambulatory Visit: Payer: Self-pay | Admitting: Internal Medicine

## 2012-04-29 DIAGNOSIS — Z7982 Long term (current) use of aspirin: Secondary | ICD-10-CM

## 2012-04-29 DIAGNOSIS — Z96659 Presence of unspecified artificial knee joint: Secondary | ICD-10-CM

## 2012-04-29 DIAGNOSIS — Z471 Aftercare following joint replacement surgery: Secondary | ICD-10-CM

## 2012-04-29 DIAGNOSIS — E119 Type 2 diabetes mellitus without complications: Secondary | ICD-10-CM

## 2012-05-03 ENCOUNTER — Telehealth: Payer: Self-pay | Admitting: *Deleted

## 2012-05-03 NOTE — Telephone Encounter (Signed)
Left msg on triage received bill from ov 11/28/11. Not sure why she received bill. Called pt back inform her she will need to call our billing dept.  Gave her the billing dept #...lmb

## 2012-05-08 ENCOUNTER — Ambulatory Visit (INDEPENDENT_AMBULATORY_CARE_PROVIDER_SITE_OTHER): Payer: Medicare Other | Admitting: Internal Medicine

## 2012-05-08 ENCOUNTER — Other Ambulatory Visit (INDEPENDENT_AMBULATORY_CARE_PROVIDER_SITE_OTHER): Payer: Medicare Other

## 2012-05-08 ENCOUNTER — Encounter: Payer: Self-pay | Admitting: Internal Medicine

## 2012-05-08 ENCOUNTER — Telehealth: Payer: Self-pay | Admitting: Internal Medicine

## 2012-05-08 VITALS — BP 120/78 | HR 82 | Temp 98.3°F

## 2012-05-08 DIAGNOSIS — I10 Essential (primary) hypertension: Secondary | ICD-10-CM

## 2012-05-08 DIAGNOSIS — R42 Dizziness and giddiness: Secondary | ICD-10-CM

## 2012-05-08 LAB — BASIC METABOLIC PANEL
BUN: 18 mg/dL (ref 6–23)
CO2: 22 mEq/L (ref 19–32)
Calcium: 9 mg/dL (ref 8.4–10.5)
Chloride: 102 mEq/L (ref 96–112)
Creatinine, Ser: 0.8 mg/dL (ref 0.4–1.2)
GFR: 80.09 mL/min (ref >60.00)
Glucose, Bld: 103 mg/dL — ABNORMAL HIGH (ref 70–99)
Potassium: 3.7 mEq/L (ref 3.5–5.1)
Sodium: 135 mEq/L (ref 135–145)

## 2012-05-08 LAB — CBC WITH DIFFERENTIAL/PLATELET
Basophils Absolute: 0 10*3/uL (ref 0.0–0.1)
Basophils Relative: 0.5 % (ref 0.0–3.0)
Eosinophils Absolute: 0.2 10*3/uL (ref 0.0–0.7)
Eosinophils Relative: 3.3 % (ref 0.0–5.0)
HCT: 36.2 % (ref 36.0–46.0)
Hemoglobin: 12.1 g/dL (ref 12.0–15.0)
Lymphocytes Relative: 27.6 % (ref 12.0–46.0)
Lymphs Abs: 1.6 10*3/uL (ref 0.7–4.0)
MCHC: 33.6 g/dL (ref 30.0–36.0)
MCV: 86.5 fl (ref 78.0–100.0)
Monocytes Absolute: 0.4 10*3/uL (ref 0.1–1.0)
Monocytes Relative: 7.4 % (ref 3.0–12.0)
Neutro Abs: 3.5 10*3/uL (ref 1.4–7.7)
Neutrophils Relative %: 61.2 % (ref 43.0–77.0)
Platelets: 307 10*3/uL (ref 150.0–400.0)
RBC: 4.18 Mil/uL (ref 3.87–5.11)
RDW: 15.4 % — ABNORMAL HIGH (ref 11.5–14.6)
WBC: 5.7 10*3/uL (ref 4.5–10.5)

## 2012-05-08 MED ORDER — LOSARTAN POTASSIUM-HCTZ 100-25 MG PO TABS
0.5000 | ORAL_TABLET | Freq: Every day | ORAL | Status: DC
Start: 2012-05-08 — End: 2012-07-29

## 2012-05-08 NOTE — Patient Instructions (Signed)
It was good to see you today. We have reviewed your prior records including labs and tests today Test(s) ordered today. Your results will be released to MyChart (or called to you) after review, usually within 72hours after test completion. If any changes need to be made, you will be notified at that same time. Reduce the losartanHCT tablet by half daily until further notice Other medications reviewed, no additional changes

## 2012-05-08 NOTE — Progress Notes (Signed)
  Subjective:    Patient ID: Nicole [name] Overall, female    DOB: 1940/08/08, 72 y.o.   MRN: [id number]  HPI  complains of dizziness Periodic spells since DC from SNF following L TKR 6 weeks Initially associated with SBP 90s, but normal BP since then Denies pain, not using narcotics No headache, vision change Taking ASA 325 bid for dvt prophlaxis  Past Medical History  Diagnosis Date  . Hypertension   . Low back pain   . Osteoarthritis   . Diabetes mellitus     type 2  . Bronchitis     hx of  . 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 Negative for fever and unexpected weight change.  Respiratory: Negative for cough and shortness of breath.   Cardiovascular: Negative for chest pain and leg swelling.  Gastrointestinal: Negative for nausea, vomiting, abdominal pain and diarrhea.  Neurological: Positive for dizziness. Negative for light-headedness and headaches.       Objective:   Physical Exam BP 120/78  Pulse 82  Temp(Src) 98.3 F (36.8 C) (Oral)  SpO2 99% Wt Readings from Last 3 Encounters:  04/17/12 201 lb 1.6 oz (91.218 kg)  03/28/12 198 lb 3.1 oz (89.9 kg)  03/28/12 198 lb 3.1 oz (89.9 kg)   Constitutional: She appears well-developed and well-nourished. No distress.  HENT: Head: Normocephalic and atraumatic. Ears: B TMs ok, no erythema or effusion; Nose: Nose normal. Mouth/Throat: Oropharynx is clear and moist. No oropharyngeal exudate.  Eyes: No nystagmus. Conjunctivae and EOM are normal. Pupils are equal, round, and reactive to light. No scleral icterus.  Neck: Normal range of motion. Neck supple. No JVD present. No thyromegaly present.  Cardiovascular: Normal rate, regular rhythm and normal heart sounds.  No murmur heard. No BLE edema. Pulmonary/Chest: Effort normal and breath sounds normal. No respiratory distress. She has no wheezes Neurological: She is alert and oriented to person, place, and time. No cranial  nerve deficit. Coordination, speech and strength are normal.  Psychiatric: She has a normal mood and affect. Her behavior is normal. Judgment and thought content normal.   Lab Results  Component Value Date   WBC 10.2 03/31/2012   HGB 9.4* 03/31/2012   HCT 27.7* 03/31/2012   PLT 194 03/31/2012   GLUCOSE 166* 03/30/2012   CHOL 163 10/31/2011   TRIG 182.0* 10/31/2011   HDL 37.30* 10/31/2011   LDLDIRECT 97.4 03/16/2010   LDLCALC 89 10/31/2011   ALT 21 03/19/2012   AST 19 03/19/2012   NA 135 03/30/2012   K 3.6 03/30/2012   CL 101 03/30/2012   CREATININE 0.77 03/30/2012   BUN 10 03/30/2012   CO2 25 03/30/2012   TSH 0.60 10/31/2011   INR 0.91 03/19/2012   HGBA1C 6.7* 10/31/2011       Assessment & Plan:   Dizzy, episodic, not clearly positional Suspect overtx of HTN  Check lytes and CBC given recent surgery Reduce by 1/2 ARB/hct and continue toprol without change Reassurance - follow up is symptoms worse or unimproved -

## 2012-05-08 NOTE — Telephone Encounter (Signed)
Patient Information:  Caller Name: Shaneequa  Phone: [phone]  Patient: ,   Gender: Female  DOB: 04/30/1940  Age: 72 Years  PCP: Sanda Linger (Adults only)  Office Follow Up:  Does the office need to follow up with this patient?: No  Instructions For The Office: N/A   Symptoms  Reason For Call & Symptoms: Patient she is 6 weeks post op knee replacement. She started her physicial therapy yesterday.  she began experiencing some dizziness 2 weeks ago while at Rehab. She states that they would check her Blood pressure and it drop and then return to normal.  She is now home, walking with cane.  She states she is having an intermittent dizziness episodes upon standing, now feeling this at night while laying down.  "head doesn't feel right".  Her B/P 118/75.  She is unsure if it is her Blood pressure medication making her dizziness or cause  Reviewed Health History In EMR: Yes  Reviewed Medications In EMR: Yes  Reviewed Allergies In EMR: Yes  Reviewed Surgeries / Procedures: Yes  Date of Onset of Symptoms: 04/24/2012  Guideline(s) Used:  Dizziness  Disposition Per Guideline:   See Today in Office  Reason For Disposition Reached:   Patient wants to be seen  Advice Given:  Some Causes of Temporary Dizziness:  Poor Fluid Intake - Not drinking enough fluids and being a little dehydrated is a common cause of temporary dizziness. This is always worse during hot weather.  Standing Up Suddenly - Standing up suddenly (especially getting out of bed) or prolonged standing in one place are common causes of temporary dizziness. Not drinking enough fluids always makes it worse. Certain medications can cause or increase this type of dizziness (e.g., blood pressure medications).  Drink Fluids:  Drink several glasses of fruit juice, other clear fluids, or water. This will improve hydration and blood glucose. If you have a fever or have had heat exposure, make sure the fluids are cold.  Cool Off:  If  the weather is hot, apply a cold compress to the forehead or take a cool shower or bath.  Rest for 1-2 Hours:  Lie down with feet elevated for 1 hour. This will improve blood flow and increase blood flow to the brain.  Call Back If:  Still feel dizzy after 2 hours of rest and fluids  Passes out (faints)  You become worse.  Patient Will Follow Care Advice:  YES  Appointment Scheduled:  05/08/2012 11:15:00 Appointment Scheduled Provider:  Rene Paci (Adults only)

## 2012-07-29 ENCOUNTER — Other Ambulatory Visit: Payer: Self-pay | Admitting: Internal Medicine

## 2012-08-22 ENCOUNTER — Encounter: Payer: Self-pay | Admitting: Internal Medicine

## 2012-08-22 ENCOUNTER — Other Ambulatory Visit (INDEPENDENT_AMBULATORY_CARE_PROVIDER_SITE_OTHER): Payer: Medicare Other

## 2012-08-22 ENCOUNTER — Ambulatory Visit (INDEPENDENT_AMBULATORY_CARE_PROVIDER_SITE_OTHER): Payer: Medicare Other | Admitting: Internal Medicine

## 2012-08-22 VITALS — BP 126/68 | HR 89 | Temp 98.2°F | Resp 16 | Ht 67.0 in | Wt 193.0 lb

## 2012-08-22 DIAGNOSIS — M899 Disorder of bone, unspecified: Secondary | ICD-10-CM

## 2012-08-22 DIAGNOSIS — E785 Hyperlipidemia, unspecified: Secondary | ICD-10-CM

## 2012-08-22 DIAGNOSIS — E119 Type 2 diabetes mellitus without complications: Secondary | ICD-10-CM

## 2012-08-22 DIAGNOSIS — IMO0001 Reserved for inherently not codable concepts without codable children: Secondary | ICD-10-CM

## 2012-08-22 DIAGNOSIS — I10 Essential (primary) hypertension: Secondary | ICD-10-CM

## 2012-08-22 DIAGNOSIS — E781 Pure hyperglyceridemia: Secondary | ICD-10-CM

## 2012-08-22 DIAGNOSIS — M949 Disorder of cartilage, unspecified: Secondary | ICD-10-CM

## 2012-08-22 DIAGNOSIS — M8580 Other specified disorders of bone density and structure, unspecified site: Secondary | ICD-10-CM

## 2012-08-22 DIAGNOSIS — E669 Obesity, unspecified: Secondary | ICD-10-CM

## 2012-08-22 HISTORY — DX: Pure hyperglyceridemia: E78.1

## 2012-08-22 LAB — COMPREHENSIVE METABOLIC PANEL
ALT: 26 U/L (ref 0–35)
AST: 22 U/L (ref 0–37)
Albumin: 3.7 g/dL (ref 3.5–5.2)
Alkaline Phosphatase: 63 U/L (ref 39–117)
BUN: 19 mg/dL (ref 6–23)
CO2: 28 mEq/L (ref 19–32)
Calcium: 9.7 mg/dL (ref 8.4–10.5)
Chloride: 102 mEq/L (ref 96–112)
Creatinine, Ser: 1.1 mg/dL (ref 0.4–1.2)
GFR: 55.11 mL/min — ABNORMAL LOW (ref >60.00)
Glucose, Bld: 131 mg/dL — ABNORMAL HIGH (ref 70–99)
Potassium: 3.6 mEq/L (ref 3.5–5.1)
Sodium: 138 mEq/L (ref 135–145)
Total Bilirubin: 0.8 mg/dL (ref 0.3–1.2)
Total Protein: 7.5 g/dL (ref 6.0–8.3)

## 2012-08-22 LAB — CBC WITH DIFFERENTIAL/PLATELET
Basophils Absolute: 0 10*3/uL (ref 0.0–0.1)
Basophils Relative: 0.3 % (ref 0.0–3.0)
Eosinophils Absolute: 0.2 10*3/uL (ref 0.0–0.7)
Eosinophils Relative: 2.4 % (ref 0.0–5.0)
HCT: 41.2 % (ref 36.0–46.0)
Hemoglobin: 13.9 g/dL (ref 12.0–15.0)
Lymphocytes Relative: 22.3 % (ref 12.0–46.0)
Lymphs Abs: 1.4 10*3/uL (ref 0.7–4.0)
MCHC: 33.9 g/dL (ref 30.0–36.0)
MCV: 84.2 fl (ref 78.0–100.0)
Monocytes Absolute: 0.5 10*3/uL (ref 0.1–1.0)
Monocytes Relative: 7.5 % (ref 3.0–12.0)
Neutro Abs: 4.3 10*3/uL (ref 1.4–7.7)
Neutrophils Relative %: 67.5 % (ref 43.0–77.0)
Platelets: 256 10*3/uL (ref 150.0–400.0)
RBC: 4.89 Mil/uL (ref 3.87–5.11)
RDW: 14.8 % — ABNORMAL HIGH (ref 11.5–14.6)
WBC: 6.4 10*3/uL (ref 4.5–10.5)

## 2012-08-22 LAB — HEMOGLOBIN A1C: Hgb A1c MFr Bld: 7.2 % — ABNORMAL HIGH (ref 4.6–6.5)

## 2012-08-22 LAB — HM DIABETES FOOT EXAM

## 2012-08-22 LAB — TSH: TSH: 0.79 u[IU]/mL (ref 0.35–5.50)

## 2012-08-22 LAB — HM DEXA SCAN

## 2012-08-22 NOTE — Assessment & Plan Note (Signed)
She is due for a DEXA scan 

## 2012-08-22 NOTE — Patient Instructions (Signed)
Type 2 Diabetes Mellitus, Adult Type 2 diabetes mellitus, often simply referred to as type 2 diabetes, is a long-lasting (chronic) disease. In type 2 diabetes, the pancreas does not make enough insulin (a hormone), the cells are less responsive to the insulin that is made (insulin resistance), or both. Normally, insulin moves sugars from food into the tissue cells. The tissue cells use the sugars for energy. The lack of insulin or the lack of normal response to insulin causes excess sugars to build up in the blood instead of going into the tissue cells. As a result, high blood sugar (hyperglycemia) develops. The effect of high sugar (glucose) levels can cause many complications. Type 2 diabetes was also previously called adult-onset diabetes but it can occur at any age.  RISK FACTORS  A person is predisposed to developing type 2 diabetes if someone in the family has the disease and also has one or more of the following primary risk factors:  Overweight.  An inactive lifestyle.  A history of consistently eating high-calorie foods. Maintaining a normal weight and regular physical activity can reduce the chance of developing type 2 diabetes. SYMPTOMS  A person with type 2 diabetes may not show symptoms initially. The symptoms of type 2 diabetes appear slowly. The symptoms include:  Increased thirst (polydipsia).  Increased urination (polyuria).  Increased urination during the night (nocturia).  Weight loss. This weight loss may be rapid.  Frequent, recurring infections.  Tiredness (fatigue).  Weakness.  Vision changes, such as blurred vision.  Fruity smell to your breath.  Abdominal pain.  Nausea or vomiting.  Cuts or bruises which are slow to heal.  Tingling or numbness in the hands or feet. DIAGNOSIS Type 2 diabetes is frequently not diagnosed until complications of diabetes are present. Type 2 diabetes is diagnosed when symptoms or complications are present and when blood  glucose levels are increased. Your blood glucose level may be checked by one or more of the following blood tests:  A fasting blood glucose test. You will not be allowed to eat for at least 8 hours before a blood sample is taken.  A random blood glucose test. Your blood glucose is checked at any time of the day regardless of when you ate.  A hemoglobin A1c blood glucose test. A hemoglobin A1c test provides information about blood glucose control over the previous 3 months.  An oral glucose tolerance test (OGTT). Your blood glucose is measured after you have not eaten (fasted) for 2 hours and then after you drink a glucose-containing beverage. TREATMENT   You may need to take insulin or diabetes medicine daily to keep blood glucose levels in the desired range.  You will need to match insulin dosing with exercise and healthy food choices. The treatment goal is to maintain the before meal blood sugar (preprandial glucose) level at 70 130 mg/dL. HOME CARE INSTRUCTIONS   Have your hemoglobin A1c level checked twice a year.  Perform daily blood glucose monitoring as directed by your caregiver.  Monitor urine ketones when you are ill and as directed by your caregiver.  Take your diabetes medicine or insulin as directed by your caregiver to maintain your blood glucose levels in the desired range.  Never run out of diabetes medicine or insulin. It is needed every day.  Adjust insulin based on your intake of carbohydrates. Carbohydrates can raise blood glucose levels but need to be included in your diet. Carbohydrates provide vitamins, minerals, and fiber which are an essential part of   a healthy diet. Carbohydrates are found in fruits, vegetables, whole grains, dairy products, legumes, and foods containing added sugars.    Eat healthy foods. Alternate 3 meals with 3 snacks.  Lose weight if overweight.  Carry a medical alert card or wear your medical alert jewelry.  Carry a 15 gram  carbohydrate snack with you at all times to treat low blood glucose (hypoglycemia). Some examples of 15 gram carbohydrate snacks include:  Glucose tablets, 3 or 4   Glucose gel, 15 gram tube  Raisins, 2 tablespoons (24 grams)  Jelly beans, 6  Animal crackers, 8  Regular pop, 4 ounces (120 mL)  Gummy treats, 9  Recognize hypoglycemia. Hypoglycemia occurs with blood glucose levels of 70 mg/dL and below. The risk for hypoglycemia increases when fasting or skipping meals, during or after intense exercise, and during sleep. Hypoglycemia symptoms can include:  Tremors or shakes.  Decreased ability to concentrate.  Sweating.  Increased heart rate.  Headache.  Dry mouth.  Hunger.  Irritability.  Anxiety.  Restless sleep.  Altered speech or coordination.  Confusion.  Treat hypoglycemia promptly. If you are alert and able to safely swallow, follow the 15:15 rule:  Take 15 20 grams of rapid-acting glucose or carbohydrate. Rapid-acting options include glucose gel, glucose tablets, or 4 ounces (120 mL) of fruit juice, regular soda, or low fat milk.  Check your blood glucose level 15 minutes after taking the glucose.  Take 15 20 grams more of glucose if the repeat blood glucose level is still 70 mg/dL or below.  Eat a meal or snack within 1 hour once blood glucose levels return to normal.    Be alert to polyuria and polydipsia which are early signs of hyperglycemia. An early awareness of hyperglycemia allows for prompt treatment. Treat hyperglycemia as directed by your caregiver.  Engage in at least 150 minutes of moderate-intensity physical activity a week, spread over at least 3 days of the week or as directed by your caregiver. In addition, you should engage in resistance exercise at least 2 times a week or as directed by your caregiver.  Adjust your medicine and food intake as needed if you start a new exercise or sport.  Follow your sick day plan at any time you  are unable to eat or drink as usual.  Avoid tobacco use.  Limit alcohol intake to no more than 1 drink per day for nonpregnant women and 2 drinks per day for men. You should drink alcohol only when you are also eating food. Talk with your caregiver whether alcohol is safe for you. Tell your caregiver if you drink alcohol several times a week.  Follow up with your caregiver regularly.  Schedule an eye exam soon after the diagnosis of type 2 diabetes and then annually.  Perform daily skin and foot care. Examine your skin and feet daily for cuts, bruises, redness, nail problems, bleeding, blisters, or sores. A foot exam by a caregiver should be done annually.  Brush your teeth and gums at least twice a day and floss at least once a day. Follow up with your dentist regularly.  Share your diabetes management plan with your workplace or school.  Stay up-to-date with immunizations.  Learn to manage stress.  Obtain ongoing diabetes education and support as needed.  Participate in, or seek rehabilitation as needed to maintain or improve independence and quality of life. Request a physical or occupational therapy referral if you are having foot or hand numbness or difficulties with grooming,   dressing, eating, or physical activity. SEEK MEDICAL CARE IF:   You are unable to eat food or drink fluids for more than 6 hours.  You have nausea and vomiting for more than 6 hours.  Your blood glucose level is over 240 mg/dL.  There is a change in mental status.  You develop an additional serious illness.  You have diarrhea for more than 6 hours.  You have been sick or have had a fever for a couple of days and are not getting better.  You have pain during any physical activity.  SEEK IMMEDIATE MEDICAL CARE IF:  You have difficulty breathing.  You have moderate to large ketone levels. MAKE SURE YOU:  Understand these instructions.  Will watch your condition.  Will get help right away if  you are not doing well or get worse. Document Released: 01/10/2005 Document Revised: 10/05/2011 Document Reviewed: 08/09/2011 [employer] Patient Information 2014 [employer].  

## 2012-08-22 NOTE — Assessment & Plan Note (Signed)
Goal achieved 

## 2012-08-22 NOTE — Progress Notes (Signed)
Subjective:    Patient ID: Nicole [name] Overall, female    DOB: 01/15/1941, 72 y.o.   MRN: [id number]  Diabetes She presents for her follow-up diabetic visit. She has type 2 diabetes mellitus. Her disease course has been stable. There are no hypoglycemic associated symptoms. Pertinent negatives for hypoglycemia include no dizziness or tremors. Pertinent negatives for diabetes include no blurred vision, no chest pain, no fatigue, no foot paresthesias, no foot ulcerations, no polydipsia, no polyphagia, no polyuria, no visual change, no weakness and no weight loss. There are no hypoglycemic complications. Symptoms are stable. There are no diabetic complications. Current diabetic treatment includes oral agent (dual therapy). She is compliant with treatment all of the time. Her weight is increasing steadily. She is following a generally healthy diet. Meal planning includes avoidance of concentrated sweets. She has not had a previous visit with a dietician. She participates in exercise intermittently. There is no change in her home blood glucose trend. An ACE inhibitor/angiotensin II receptor blocker is being taken. She does not see a podiatrist.Eye exam is not current.      Review of Systems  Constitutional: Negative.  Negative for fever, chills, weight loss, diaphoresis, appetite change and fatigue.  HENT: Negative.   Eyes: Negative.  Negative for blurred vision.  Respiratory: Negative.  Negative for cough, chest tightness, shortness of breath, wheezing and stridor.   Cardiovascular: Negative.  Negative for chest pain, palpitations and leg swelling.  Gastrointestinal: Negative.  Negative for nausea, abdominal pain, diarrhea and constipation.  Endocrine: Negative.  Negative for polydipsia, polyphagia and polyuria.  Genitourinary: Negative.   Musculoskeletal: Negative.  Negative for myalgias, back pain and gait problem.  Skin: Negative.   Allergic/Immunologic: Negative.   Neurological: Negative.  Negative  for dizziness, tremors, weakness and light-headedness.  Hematological: Negative.  Negative for adenopathy. Does not bruise/bleed easily.  Psychiatric/Behavioral: Negative.        Objective:   Physical Exam  Vitals reviewed. Constitutional: She is oriented to person, place, and time. She appears well-developed and well-nourished. No distress.  HENT:  Head: Normocephalic and atraumatic.  Mouth/Throat: Oropharynx is clear and moist. No oropharyngeal exudate.  Eyes: Conjunctivae are normal. Right eye exhibits no discharge. Left eye exhibits no discharge. No scleral icterus.  Neck: Normal range of motion. Neck supple. No JVD present. No tracheal deviation present. No thyromegaly present.  Cardiovascular: Normal rate, regular rhythm, normal heart sounds and intact distal pulses.  Exam reveals no gallop and no friction rub.   No murmur heard. Pulmonary/Chest: Effort normal and breath sounds normal. No stridor. No respiratory distress. She has no wheezes. She has no rales. She exhibits no tenderness.  Abdominal: Soft. Bowel sounds are normal. She exhibits no distension and no mass. There is no tenderness. There is no rebound and no guarding.  Musculoskeletal: Normal range of motion. She exhibits no edema and no tenderness.  Lymphadenopathy:    She has no cervical adenopathy.  Neurological: She is oriented to person, place, and time.  Skin: Skin is warm and dry. No rash noted. She is not diaphoretic. No erythema. No pallor.  Psychiatric: She has a normal mood and affect. Her behavior is normal. Judgment and thought content normal.     Lab Results  Component Value Date   WBC 5.7 05/08/2012   HGB 12.1 05/08/2012   HCT 36.2 05/08/2012   PLT 307.0 05/08/2012   GLUCOSE 103* 05/08/2012   CHOL 163 10/31/2011   TRIG 182.0* 10/31/2011   HDL 37.30* 10/31/2011  LDLDIRECT 97.4 03/16/2010   LDLCALC 89 10/31/2011   ALT 21 03/19/2012   AST 19 03/19/2012   NA 135 05/08/2012   K 3.7 05/08/2012   CL 102  05/08/2012   CREATININE 0.8 05/08/2012   BUN 18 05/08/2012   CO2 22 05/08/2012   TSH 0.60 10/31/2011   INR 0.91 03/19/2012   HGBA1C 6.7* 10/31/2011       Assessment & Plan:

## 2012-08-22 NOTE — Assessment & Plan Note (Signed)
Her blood sugar is well controlled 

## 2012-08-22 NOTE — Assessment & Plan Note (Signed)
She will work on her lifestyle modifications 

## 2012-08-22 NOTE — Assessment & Plan Note (Signed)
Her BP is well controlled 

## 2012-11-19 ENCOUNTER — Ambulatory Visit (INDEPENDENT_AMBULATORY_CARE_PROVIDER_SITE_OTHER): Payer: Medicare Other | Admitting: Internal Medicine

## 2012-11-19 ENCOUNTER — Encounter: Payer: Self-pay | Admitting: Internal Medicine

## 2012-11-19 ENCOUNTER — Telehealth: Payer: Self-pay | Admitting: Internal Medicine

## 2012-11-19 VITALS — BP 130/80 | HR 80 | Temp 97.7°F | Ht 67.0 in | Wt 190.5 lb

## 2012-11-19 DIAGNOSIS — K644 Residual hemorrhoidal skin tags: Secondary | ICD-10-CM

## 2012-11-19 NOTE — Telephone Encounter (Signed)
Patient Information:  Caller Name: Keirra  Phone: [phone]  Patient: Nicole [name], Nicole [name]  Gender: Female  DOB: 1940/04/20  Age: 72 Years  PCP: Sanda Linger (Adults only)  Office Follow Up:  Does the office need to follow up with this patient?: No  Instructions For The Office: N/A   Symptoms  Reason For Call & Symptoms: Lump under skin on L side of buttocks that is pea sized. Bump is not tender to touch, but is getting bigger. Hx Uterine CA and had Hysterectomy 10 years ago. Requesting appointment to have it checked.  Reviewed Health History In EMR: Yes  Reviewed Medications In EMR: Yes  Reviewed Allergies In EMR: Yes  Reviewed Surgeries / Procedures: Yes  Date of Onset of Symptoms: 10/25/2010  Guideline(s) Used:  Lymph Nodes - Swollen  Disposition Per Guideline:   See Today or Tomorrow in Office  Reason For Disposition Reached:   Patient wants to be seen  Advice Given:  Swollen Lymph Nodes from a Viral Infection:  Viral throat infections and colds can cause lymph nodes in the neck to double in size. Slight enlargement and mild tenderness means the lymph node is fighting the infection and doing a good job.  Treatment: Usually no treatment is necessary.  Call Back If:  Node enlarges to more than 1 inch (2.5 cm) in size  Overlying skin becomes red  Fever more than 103 F (39.4 C) occurs  You become worse or are worried about a lymph node.  Patient Will Follow Care Advice:  YES  Appointment Scheduled:  11/19/2012 13:15:00 Appointment Scheduled Provider:  Nicki Reaper

## 2012-11-19 NOTE — Patient Instructions (Signed)
Skin Tags:  No need for treatment at this time Continue to monitor for further changes such as growth, crusting, bleeding Follow up if desire for removal

## 2012-11-19 NOTE — Progress Notes (Signed)
Subjective:    Patient ID: Nicole [name], female    DOB: 04/28/1940, 72 y.o.   MRN: [id number]  HPI  Pt presents to the clinic today with c/o a "lump on her left buttocks"  Review of Systems      Past Medical History  Diagnosis Date  . Hypertension   . Low back pain   . Osteoarthritis   . Diabetes mellitus     type 2  . Bronchitis     hx of  . 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 acetaminophen (TYLENOL) 500 MG tablet Take 500 mg by mouth as needed for pain.      [name] aspirin 325 MG tablet Take 325 mg by mouth 2 (two) times daily. X 1 month      . ferrous sulfate 325 (65 FE) MG tablet Take 325 mg by mouth 2 (two) times daily.      [name] losartan-hydrochlorothiazide (HYZAAR) 100-25 MG per tablet take 1 tablet by mouth once daily for high blood pressure  90 tablet  3  . metoprolol succinate (TOPROL-XL) 50 MG 24 hr tablet take 1 tablet by mouth once daily  90 tablet  3  . sitaGLIPtan-metformin (JANUMET) 50-500 MG per tablet Take 1 tablet by mouth 2 (two) times daily with a meal.       No current facility-administered medications for this visit.    No Known Allergies  Family History  Problem Relation Age of Onset  . Arthritis Other   . Cancer Other     colon, lst degree relative  . Diabetes Other     st degree relative  . Hyperlipidemia Other   . Hypertension Other     History   Social History  . Marital Status: Married    Spouse Name: N/A    Number of Children: N/A  . Years of Education: N/A   Occupational History  . retired    Social History Main Topics  . Smoking status: Never Smoker   . Smokeless tobacco: Never Used  . Alcohol Use: No  . Drug Use: No  . Sexual Activity: Not Currently   Other Topics Concern  . Not on file   Social History Narrative   Regular exercise- Yes     Constitutional: Denies fever, malaise, fatigue, headache or abrupt weight changes.  Skin: Pt reports lump on  left buttock. Denies redness, rashes, lesions or ulcercations.    No other specific complaints in a complete review of systems (except as listed in HPI above).  Objective:   Physical Exam  BP 130/80  Pulse 80  Temp(Src) 97.7 F (36.5 C) (Oral)  Ht 5\' 7"  (1.702 m)  Wt 190 lb 8 oz (86.41 kg)  BMI 29.83 kg/m2  SpO2 95% Wt Readings from Last 3 Encounters:  11/19/12 190 lb 8 oz (86.41 kg)  08/22/12 193 lb (87.544 kg)  04/17/12 201 lb 1.6 oz (91.218 kg)    General: Appears her stated age, well developed, well nourished in NAD. Skin: Warm, dry and intact. No rashes, lesions or ulcerations noted. Small skin tag noted on left buttock- no irritation noted. Cardiovascular: Normal rate and rhythm. S1,S2 noted.  No murmur, rubs or gallops noted. No JVD or BLE edema. No carotid bruits noted. Pulmonary/Chest: Normal effort and positive vesicular breath sounds. No respiratory distress. No wheezes, rales or ronchi noted.    BMET    Component Value Date/Time   NA  138 08/22/2012 1451   K 3.6 08/22/2012 1451   CL 102 08/22/2012 1451   CO2 28 08/22/2012 1451   GLUCOSE 131* 08/22/2012 1451   BUN 19 08/22/2012 1451   CREATININE 1.1 08/22/2012 1451   CALCIUM 9.7 08/22/2012 1451   GFRNONAA 84* 03/30/2012 0415   GFRAA >90 03/30/2012 0415    Lipid Panel     Component Value Date/Time   CHOL 163 10/31/2011 1154   TRIG 182.0* 10/31/2011 1154   HDL 37.30* 10/31/2011 1154   CHOLHDL 4 10/31/2011 1154   VLDL 36.4 10/31/2011 1154   LDLCALC 89 10/31/2011 1154    CBC    Component Value Date/Time   WBC 6.4 08/22/2012 1451   RBC 4.89 08/22/2012 1451   HGB 13.9 08/22/2012 1451   HCT 41.2 08/22/2012 1451   PLT 256.0 08/22/2012 1451   MCV 84.2 08/22/2012 1451   MCH 29.8 03/31/2012 0415   MCHC 33.9 08/22/2012 1451   RDW 14.8* 08/22/2012 1451   LYMPHSABS 1.4 08/22/2012 1451   MONOABS 0.5 08/22/2012 1451   EOSABS 0.2 08/22/2012 1451   BASOSABS 0.0 08/22/2012 1451    Hgb A1C Lab Results  Component Value Date   HGBA1C  7.2* 08/22/2012         Assessment & Plan:     Skin tag of left buttock, new onset:  Reassurance given that skin tags are not related to skin cancer If it becomes bothersome or irritated, we can remove it- pt declines at this time Information given for skin care  RTC as needed or if you change your mind about having the skin tag removed

## 2012-11-29 ENCOUNTER — Other Ambulatory Visit: Payer: Self-pay

## 2012-12-05 LAB — HM MAMMOGRAPHY: HM Mammogram: NORMAL

## 2013-01-18 ENCOUNTER — Ambulatory Visit (INDEPENDENT_AMBULATORY_CARE_PROVIDER_SITE_OTHER): Payer: Medicare Other

## 2013-01-18 ENCOUNTER — Ambulatory Visit (INDEPENDENT_AMBULATORY_CARE_PROVIDER_SITE_OTHER): Payer: Medicare Other | Admitting: Internal Medicine

## 2013-01-18 ENCOUNTER — Ambulatory Visit (INDEPENDENT_AMBULATORY_CARE_PROVIDER_SITE_OTHER)
Admission: RE | Admit: 2013-01-18 | Discharge: 2013-01-18 | Disposition: A | Discharge status: Home / Self Care | Payer: Medicare Other | Source: Ambulatory Visit | Attending: Internal Medicine | Admitting: Internal Medicine

## 2013-01-18 ENCOUNTER — Encounter: Payer: Self-pay | Admitting: Internal Medicine

## 2013-01-18 VITALS — BP 150/94 | HR 76 | Temp 97.2°F | Wt 191.0 lb

## 2013-01-18 DIAGNOSIS — I10 Essential (primary) hypertension: Secondary | ICD-10-CM

## 2013-01-18 DIAGNOSIS — E785 Hyperlipidemia, unspecified: Secondary | ICD-10-CM

## 2013-01-18 DIAGNOSIS — IMO0001 Reserved for inherently not codable concepts without codable children: Secondary | ICD-10-CM

## 2013-01-18 DIAGNOSIS — M064 Inflammatory polyarthropathy: Secondary | ICD-10-CM

## 2013-01-18 DIAGNOSIS — M542 Cervicalgia: Secondary | ICD-10-CM

## 2013-01-18 DIAGNOSIS — E781 Pure hyperglyceridemia: Secondary | ICD-10-CM

## 2013-01-18 DIAGNOSIS — M109 Gout, unspecified: Secondary | ICD-10-CM | POA: Insufficient documentation

## 2013-01-18 DIAGNOSIS — M1990 Unspecified osteoarthritis, unspecified site: Secondary | ICD-10-CM

## 2013-01-18 LAB — URINALYSIS, ROUTINE W REFLEX MICROSCOPIC
Bilirubin Urine: NEGATIVE
Hgb urine dipstick: NEGATIVE
Ketones, ur: NEGATIVE
Nitrite: NEGATIVE
Specific Gravity, Urine: 1.02 (ref 1.000–1.030)
Total Protein, Urine: NEGATIVE
Urine Glucose: NEGATIVE
Urobilinogen, UA: 0.2 (ref 0.0–1.0)
pH: 6 (ref 5.0–8.0)

## 2013-01-18 LAB — SEDIMENTATION RATE: Sed Rate: 32 mm/hr — ABNORMAL HIGH (ref 0–22)

## 2013-01-18 LAB — COMPREHENSIVE METABOLIC PANEL
ALT: 25 U/L (ref 0–35)
AST: 22 U/L (ref 0–37)
Albumin: 3.6 g/dL (ref 3.5–5.2)
Alkaline Phosphatase: 54 U/L (ref 39–117)
BUN: 18 mg/dL (ref 6–23)
CO2: 25 mEq/L (ref 19–32)
Calcium: 9.2 mg/dL (ref 8.4–10.5)
Chloride: 103 mEq/L (ref 96–112)
Creatinine, Ser: 0.8 mg/dL (ref 0.4–1.2)
GFR: 77.56 mL/min (ref >60.00)
Glucose, Bld: 115 mg/dL — ABNORMAL HIGH (ref 70–99)
Potassium: 3.6 mEq/L (ref 3.5–5.1)
Sodium: 135 mEq/L (ref 135–145)
Total Bilirubin: 0.6 mg/dL (ref 0.3–1.2)
Total Protein: 7.3 g/dL (ref 6.0–8.3)

## 2013-01-18 LAB — CBC WITH DIFFERENTIAL/PLATELET
Basophils Absolute: 0 10*3/uL (ref 0.0–0.1)
Basophils Relative: 0.3 % (ref 0.0–3.0)
Eosinophils Absolute: 0.1 10*3/uL (ref 0.0–0.7)
Eosinophils Relative: 1.9 % (ref 0.0–5.0)
HCT: 40.7 % (ref 36.0–46.0)
Hemoglobin: 13.8 g/dL (ref 12.0–15.0)
Lymphocytes Relative: 19.7 % (ref 12.0–46.0)
Lymphs Abs: 1.4 10*3/uL (ref 0.7–4.0)
MCHC: 33.9 g/dL (ref 30.0–36.0)
MCV: 85.8 fl (ref 78.0–100.0)
Monocytes Absolute: 0.5 10*3/uL (ref 0.1–1.0)
Monocytes Relative: 7.1 % (ref 3.0–12.0)
Neutro Abs: 5 10*3/uL (ref 1.4–7.7)
Neutrophils Relative %: 71 % (ref 43.0–77.0)
Platelets: 254 10*3/uL (ref 150.0–400.0)
RBC: 4.74 Mil/uL (ref 3.87–5.11)
RDW: 12.8 % (ref 11.5–14.6)
WBC: 7 10*3/uL (ref 4.5–10.5)

## 2013-01-18 LAB — LIPID PANEL
Cholesterol: 182 mg/dL (ref 0–200)
HDL: 38.4 mg/dL — ABNORMAL LOW (ref >39.00)
Total CHOL/HDL Ratio: 5
Triglycerides: 231 mg/dL — ABNORMAL HIGH (ref 0.0–149.0)
VLDL: 46.2 mg/dL — ABNORMAL HIGH (ref 0.0–40.0)

## 2013-01-18 LAB — HEMOGLOBIN A1C: Hgb A1c MFr Bld: 7.1 % — ABNORMAL HIGH (ref 4.6–6.5)

## 2013-01-18 LAB — RHEUMATOID FACTOR: Rhuematoid fact SerPl-aCnc: <10 IU/mL (ref <=14)

## 2013-01-18 LAB — TSH: TSH: 0.96 u[IU]/mL (ref 0.35–5.50)

## 2013-01-18 LAB — LDL CHOLESTEROL, DIRECT: Direct LDL: 118 mg/dL

## 2013-01-18 LAB — C-REACTIVE PROTEIN: CRP: 0.5 mg/dL (ref 0.5–20.0)

## 2013-01-18 LAB — URIC ACID: Uric Acid, Serum: 3.8 mg/dL (ref 2.4–7.0)

## 2013-01-18 MED ORDER — METHYLPREDNISOLONE ACETATE 80 MG/ML IJ SUSP
120.0000 mg | Freq: Once | INTRAMUSCULAR | Status: AC
  Administered 2013-01-18: 120 mg via INTRAMUSCULAR

## 2013-01-18 NOTE — Progress Notes (Signed)
Pre-visit discussion using our clinic review tool. No additional management support is needed unless otherwise documented below in the visit note.  

## 2013-01-18 NOTE — Patient Instructions (Signed)
Osteoarthritis Osteoarthritis is the most common form of arthritis. It is redness, soreness, and swelling (inflammation) affecting the cartilage. Cartilage acts as a cushion, covering the ends of bones where they meet to form a joint. CAUSES  Over time, the cartilage begins to wear away. This causes bone to rub on bone. This produces pain and stiffness in the affected joints. Factors that contribute to this problem are:  Excessive body weight.  Age.  Overuse of joints. SYMPTOMS   People with osteoarthritis usually experience joint pain, swelling, or stiffness.  Over time, the joint may lose its normal shape.  Small deposits of bone (osteophytes) may grow on the edges of the joint.  Bits of bone or cartilage can break off and float inside the joint space. This may cause more pain and damage.  Osteoarthritis can lead to depression, anxiety, feelings of helplessness, and limitations on daily activities. The most commonly affected joints are in the:  Ends of the fingers.  Thumbs.  Neck.  Lower back.  Knees.  Hips. DIAGNOSIS  Diagnosis is mostly based on your symptoms and exam. Tests may be helpful, including:  X-rays of the affected joint.  A computerized magnetic scan (MRI).  Blood tests to rule out other types of arthritis.  Joint fluid tests. This involves using a needle to draw fluid from the joint and examining the fluid under a microscope. TREATMENT  Goals of treatment are to control pain, improve joint function, maintain a normal body weight, and maintain a healthy lifestyle. Treatment approaches may include:  A prescribed exercise program with rest and joint relief.  Weight control with nutritional education.  Pain relief techniques such as:  Properly applied heat and cold.  Electric pulses delivered to nerve endings under the skin (transcutaneous electrical nerve stimulation, TENS).  Massage.  Certain supplements. Ask your caregiver before using any  supplements, especially in combination with prescribed drugs.  Medicines to control pain, such as:  Acetaminophen.  Nonsteroidal anti-inflammatory drugs (NSAIDs), such as naproxen.  Narcotic or central-acting agents, such as tramadol. This drug carries a risk of addiction and is generally prescribed for short-term use.  Corticosteroids. These can be given orally or as injection. This is a short-term treatment, not recommended for routine use.  Surgery to reposition the bones and relieve pain (osteotomy) or to remove loose pieces of bone and cartilage. Joint replacement may be needed in advanced states of osteoarthritis. HOME CARE INSTRUCTIONS  Your caregiver can recommend specific types of exercise. These may include:  Strengthening exercises. These are done to strengthen the muscles that support joints affected by arthritis. They can be performed with weights or with exercise bands to add resistance.  Aerobic activities. These are exercises, such as brisk walking or low-impact aerobics, that get your heart pumping. They can help keep your lungs and circulatory system in shape.  Range-of-motion activities. These keep your joints limber.  Balance and agility exercises. These help you maintain daily living skills. Learning about your condition and being actively involved in your care will help improve the course of your osteoarthritis. SEEK MEDICAL CARE IF:   You feel hot or your skin turns red.  You develop a rash in addition to your joint pain.  You have an oral temperature above 102 F (38.9 C). FOR MORE INFORMATION  National Institute of Arthritis and Musculoskeletal and Skin Diseases: www.niams.nih.gov National Institute on Aging: www.nia.nih.gov American College of Rheumatology: www.rheumatology.org Document Released: 01/10/2005 Document Revised: 04/04/2011 Document Reviewed: 04/23/2009 [employer] Patient Information 2014 [employer].  

## 2013-01-18 NOTE — Progress Notes (Signed)
Subjective:    Patient ID: Nicole [name], female    DOB: 12-09-1940, 72 y.o.   MRN: [id number]  Arthritis Presents for initial visit. The disease course has been worsening. The condition has lasted for 2 weeks. She complains of pain, stiffness, joint swelling and joint warmth. Affected locations include the left wrist. Her pain is at a severity of 3/10. Associated symptoms include pain at night and pain while resting. Pertinent negatives include no diarrhea, dry eyes, dry mouth, dysuria, fatigue, fever, rash, Raynaud's syndrome, uveitis or weight loss. Past treatments include NSAIDs. The treatment provided moderate relief. Factors aggravating her arthritis include activity.      Review of Systems  Constitutional: Negative.  Negative for fever, chills, weight loss, diaphoresis, appetite change and fatigue.  HENT: Negative.   Eyes: Negative.   Respiratory: Negative.  Negative for apnea, cough, choking, chest tightness, shortness of breath, wheezing and stridor.   Cardiovascular: Negative.  Negative for chest pain, palpitations and leg swelling.  Gastrointestinal: Negative.  Negative for nausea, vomiting, abdominal pain, diarrhea, constipation and blood in stool.  Endocrine: Negative.  Negative for polydipsia, polyphagia and polyuria.  Genitourinary: Negative.  Negative for dysuria.  Musculoskeletal: Positive for arthralgias, arthritis, joint swelling, neck pain and stiffness. Negative for back pain, gait problem, myalgias and neck stiffness.       She has a sharp pain on the left side of her neck that radiates to her left shoulder.  Skin: Negative.  Negative for color change and rash.  Allergic/Immunologic: Negative.   Neurological: Negative.  Negative for weakness and headaches.  Hematological: Negative.  Negative for adenopathy. Does not bruise/bleed easily.  Psychiatric/Behavioral: Negative.        Objective:   Physical Exam  Vitals reviewed. Constitutional: She is oriented to  person, place, and time. She appears well-developed and well-nourished. No distress.  HENT:  Head: Normocephalic and atraumatic.  Mouth/Throat: Oropharynx is clear and moist. No oropharyngeal exudate.  Eyes: Conjunctivae are normal. Right eye exhibits no discharge. Left eye exhibits no discharge. No scleral icterus.  Neck: Normal range of motion. Neck supple. No JVD present. No tracheal deviation present. No thyromegaly present.  Cardiovascular: Normal rate, regular rhythm, normal heart sounds and intact distal pulses.  Exam reveals no gallop and no friction rub.   No murmur heard. Pulmonary/Chest: Effort normal and breath sounds normal. No stridor. No respiratory distress. She has no wheezes. She has no rales. She exhibits no tenderness.  Abdominal: Soft. Bowel sounds are normal. She exhibits no distension and no mass. There is no tenderness. There is no rebound and no guarding.  Musculoskeletal:       Left wrist: She exhibits tenderness, swelling and effusion. She exhibits normal range of motion, no bony tenderness, no crepitus, no deformity and no laceration.       Cervical back: Normal. She exhibits normal range of motion, no tenderness, no bony tenderness, no swelling, no edema, no deformity, no laceration, no pain, no spasm and normal pulse.       Arms: Lymphadenopathy:    She has no cervical adenopathy.  Neurological: She is alert and oriented to person, place, and time. She has normal reflexes. She displays no atrophy, no tremor and normal reflexes. No cranial nerve deficit or sensory deficit. She exhibits normal muscle tone. She displays a negative Romberg sign. She displays no seizure activity. Coordination and gait normal.  Skin: Skin is warm and dry. No rash noted. She is not diaphoretic. No erythema. No pallor.  Psychiatric: She has a normal mood and affect. Her behavior is normal. Judgment and thought content normal.     Lab Results  Component Value Date   WBC 6.4 08/22/2012    HGB 13.9 08/22/2012   HCT 41.2 08/22/2012   PLT 256.0 08/22/2012   GLUCOSE 131* 08/22/2012   CHOL 163 10/31/2011   TRIG 182.0* 10/31/2011   HDL 37.30* 10/31/2011   LDLDIRECT 97.4 03/16/2010   LDLCALC 89 10/31/2011   ALT 26 08/22/2012   AST 22 08/22/2012   NA 138 08/22/2012   K 3.6 08/22/2012   CL 102 08/22/2012   CREATININE 1.1 08/22/2012   BUN 19 08/22/2012   CO2 28 08/22/2012   TSH 0.79 08/22/2012   INR 0.91 03/19/2012   HGBA1C 7.2* 08/22/2012       Assessment & Plan:

## 2013-01-19 ENCOUNTER — Encounter: Payer: Self-pay | Admitting: Internal Medicine

## 2013-01-19 NOTE — Assessment & Plan Note (Signed)
Her A1C shows good control of the blood sugars 

## 2013-01-19 NOTE — Assessment & Plan Note (Signed)
Plain film shows a lot of disease in her neck I have given her an injection of steroids to help with the pain She will let me know if she wishes to pursue further testing or treatment

## 2013-01-19 NOTE — Assessment & Plan Note (Signed)
I have given her an injection of depo-medrol IM to reduce the pain and inflammation Have also ordered labs to check her for inflammatory arthritites like lupus, RA, gout, pseudogout, etc.

## 2013-01-19 NOTE — Assessment & Plan Note (Signed)
I will recheck her FLP today 

## 2013-01-21 ENCOUNTER — Encounter: Payer: Self-pay | Admitting: Internal Medicine

## 2013-01-21 LAB — ANA: Anti Nuclear Antibody(ANA): POSITIVE — AB

## 2013-01-21 LAB — ANTI-NUCLEAR AB-TITER (ANA TITER): ANA Titer 1: 1:40 {titer} — ABNORMAL HIGH

## 2013-01-29 ENCOUNTER — Encounter: Payer: Self-pay | Admitting: Internal Medicine

## 2013-01-29 ENCOUNTER — Other Ambulatory Visit (INDEPENDENT_AMBULATORY_CARE_PROVIDER_SITE_OTHER): Payer: Medicare Other

## 2013-01-29 ENCOUNTER — Ambulatory Visit (INDEPENDENT_AMBULATORY_CARE_PROVIDER_SITE_OTHER): Payer: Medicare Other | Admitting: Internal Medicine

## 2013-01-29 ENCOUNTER — Ambulatory Visit (INDEPENDENT_AMBULATORY_CARE_PROVIDER_SITE_OTHER)
Admission: RE | Admit: 2013-01-29 | Discharge: 2013-01-29 | Disposition: A | Discharge status: Home / Self Care | Payer: Medicare Other | Source: Ambulatory Visit | Attending: Internal Medicine | Admitting: Internal Medicine

## 2013-01-29 VITALS — BP 140/82 | HR 78 | Temp 98.5°F | Resp 16 | Ht 67.0 in | Wt 194.0 lb

## 2013-01-29 DIAGNOSIS — IMO0001 Reserved for inherently not codable concepts without codable children: Secondary | ICD-10-CM

## 2013-01-29 DIAGNOSIS — I10 Essential (primary) hypertension: Secondary | ICD-10-CM

## 2013-01-29 DIAGNOSIS — M1990 Unspecified osteoarthritis, unspecified site: Secondary | ICD-10-CM

## 2013-01-29 DIAGNOSIS — M064 Inflammatory polyarthropathy: Secondary | ICD-10-CM

## 2013-01-29 DIAGNOSIS — E1165 Type 2 diabetes mellitus with hyperglycemia: Secondary | ICD-10-CM

## 2013-01-29 DIAGNOSIS — R768 Other specified abnormal immunological findings in serum: Secondary | ICD-10-CM

## 2013-01-29 DIAGNOSIS — R894 Abnormal immunological findings in specimens from other organs, systems and tissues: Secondary | ICD-10-CM

## 2013-01-29 DIAGNOSIS — M25539 Pain in unspecified wrist: Secondary | ICD-10-CM

## 2013-01-29 DIAGNOSIS — M25532 Pain in left wrist: Secondary | ICD-10-CM

## 2013-01-29 DIAGNOSIS — Z23 Encounter for immunization: Secondary | ICD-10-CM

## 2013-01-29 DIAGNOSIS — E785 Hyperlipidemia, unspecified: Secondary | ICD-10-CM

## 2013-01-29 LAB — COMPREHENSIVE METABOLIC PANEL
ALT: 24 U/L (ref 0–35)
AST: 21 U/L (ref 0–37)
Albumin: 3.8 g/dL (ref 3.5–5.2)
Alkaline Phosphatase: 54 U/L (ref 39–117)
BUN: 24 mg/dL — ABNORMAL HIGH (ref 6–23)
CO2: 25 mEq/L (ref 19–32)
Calcium: 9.1 mg/dL (ref 8.4–10.5)
Chloride: 105 mEq/L (ref 96–112)
Creatinine, Ser: 1 mg/dL (ref 0.4–1.2)
GFR: 59.6 mL/min — ABNORMAL LOW (ref >60.00)
Glucose, Bld: 116 mg/dL — ABNORMAL HIGH (ref 70–99)
Potassium: 3.8 mEq/L (ref 3.5–5.1)
Sodium: 138 mEq/L (ref 135–145)
Total Bilirubin: 1.1 mg/dL (ref 0.3–1.2)
Total Protein: 7.5 g/dL (ref 6.0–8.3)

## 2013-01-29 LAB — TSH: TSH: 1.34 u[IU]/mL (ref 0.35–5.50)

## 2013-01-29 LAB — HEMOGLOBIN A1C: Hgb A1c MFr Bld: 7.1 % — ABNORMAL HIGH (ref 4.6–6.5)

## 2013-01-29 MED ORDER — DICLOFENAC 18 MG PO CAPS: 1.0000 | ORAL_CAPSULE | Freq: Three times a day (TID) | ORAL | Status: DC

## 2013-01-29 NOTE — Progress Notes (Signed)
Pre visit review using our clinic review tool, if applicable. No additional management support is needed unless otherwise documented below in the visit note. 

## 2013-01-29 NOTE — Progress Notes (Signed)
Subjective:    Patient ID: Nicole [name], female    DOB: [birth year], 73 y.o.   MRN: [id number]  Arthritis Presents for follow-up visit. The disease course has been improving. She complains of pain, stiffness, joint swelling and joint warmth. Affected locations include the left wrist. Her pain is at a severity of 2/10. Associated symptoms include pain at night and pain while resting. Pertinent negatives include no diarrhea, dry eyes, dry mouth, dysuria, fatigue, fever, rash, Raynaud's syndrome, uveitis or weight loss. Her past medical history is significant for osteoarthritis. Her pertinent risk factors include overuse. Past treatments include corticosteroids. The treatment provided moderate relief. Factors aggravating her arthritis include activity.      Review of Systems  Constitutional: Negative.  Negative for fever, weight loss and fatigue.  HENT: Negative.   Eyes: Negative.   Respiratory: Negative.  Negative for cough, choking, chest tightness, shortness of breath, wheezing and stridor.   Cardiovascular: Negative.  Negative for chest pain, palpitations and leg swelling.  Gastrointestinal: Negative.  Negative for nausea, abdominal pain and diarrhea.  Endocrine: Negative.  Negative for polydipsia, polyphagia and polyuria.  Genitourinary: Negative.  Negative for dysuria.  Musculoskeletal: Positive for arthralgias, arthritis, joint swelling and stiffness. Negative for back pain, gait problem, myalgias, neck pain and neck stiffness.  Skin: Negative.  Negative for rash.  Allergic/Immunologic: Negative.   Neurological: Negative.   Hematological: Negative.  Negative for adenopathy. Does not bruise/bleed easily.  Psychiatric/Behavioral: Negative.        Objective:   Physical Exam  Vitals reviewed. Constitutional: She is oriented to person, place, and time. She appears well-developed and well-nourished. No distress.  HENT:  Head: Normocephalic and atraumatic.  Mouth/Throat: Oropharynx  is clear and moist. No oropharyngeal exudate.  Eyes: Conjunctivae are normal. Right eye exhibits no discharge. Left eye exhibits no discharge. No scleral icterus.  Neck: Normal range of motion. Neck supple. No JVD present. No tracheal deviation present. No thyromegaly present.  Cardiovascular: Normal rate, regular rhythm, normal heart sounds and intact distal pulses.  Exam reveals no gallop and no friction rub.   No murmur heard. Pulmonary/Chest: Effort normal and breath sounds normal. No stridor. No respiratory distress. She has no wheezes. She has no rales. She exhibits no tenderness.  Abdominal: Soft. Bowel sounds are normal. She exhibits no distension and no mass. There is no tenderness. There is no rebound and no guarding.  Musculoskeletal: Normal range of motion. She exhibits tenderness. She exhibits no edema.       Left wrist: She exhibits tenderness, swelling and effusion. She exhibits normal range of motion, no bony tenderness, no crepitus, no deformity and no laceration.       Arms: Lymphadenopathy:    She has no cervical adenopathy.  Neurological: She is oriented to person, place, and time.  Skin: Skin is warm and dry. No rash noted. She is not diaphoretic. No erythema. No pallor.     Lab Results  Component Value Date   WBC 7.0 01/18/2013   HGB 13.8 01/18/2013   HCT 40.7 01/18/2013   PLT 254.0 01/18/2013   GLUCOSE 115* 01/18/2013   CHOL 182 01/18/2013   TRIG 231.0* 01/18/2013   HDL 38.40* 01/18/2013   LDLDIRECT 118.0 01/18/2013   LDLCALC 89 10/31/2011   ALT 25 01/18/2013   AST 22 01/18/2013   NA 135 01/18/2013   K 3.6 01/18/2013   CL 103 01/18/2013   CREATININE 0.8 01/18/2013   BUN 18 01/18/2013   CO2 25 01/18/2013  TSH 0.96 01/18/2013   INR 0.91 03/19/2012   HGBA1C 7.1* 01/18/2013       Assessment & Plan:

## 2013-01-29 NOTE — Patient Instructions (Signed)
Osteoarthritis Osteoarthritis is the most common form of arthritis. It is redness, soreness, and swelling (inflammation) affecting the cartilage. Cartilage acts as a cushion, covering the ends of bones where they meet to form a joint. CAUSES  Over time, the cartilage begins to wear away. This causes bone to rub on bone. This produces pain and stiffness in the affected joints. Factors that contribute to this problem are:  Excessive body weight.  Age.  Overuse of joints. SYMPTOMS   People with osteoarthritis usually experience joint pain, swelling, or stiffness.  Over time, the joint may lose its normal shape.  Small deposits of bone (osteophytes) may grow on the edges of the joint.  Bits of bone or cartilage can break off and float inside the joint space. This may cause more pain and damage.  Osteoarthritis can lead to depression, anxiety, feelings of helplessness, and limitations on daily activities. The most commonly affected joints are in the:  Ends of the fingers.  Thumbs.  Neck.  Lower back.  Knees.  Hips. DIAGNOSIS  Diagnosis is mostly based on your symptoms and exam. Tests may be helpful, including:  X-rays of the affected joint.  A computerized magnetic scan (MRI).  Blood tests to rule out other types of arthritis.  Joint fluid tests. This involves using a needle to draw fluid from the joint and examining the fluid under a microscope. TREATMENT  Goals of treatment are to control pain, improve joint function, maintain a normal body weight, and maintain a healthy lifestyle. Treatment approaches may include:  A prescribed exercise program with rest and joint relief.  Weight control with nutritional education.  Pain relief techniques such as:  Properly applied heat and cold.  Electric pulses delivered to nerve endings under the skin (transcutaneous electrical nerve stimulation, TENS).  Massage.  Certain supplements. Ask your caregiver before using any  supplements, especially in combination with prescribed drugs.  Medicines to control pain, such as:  Acetaminophen.  Nonsteroidal anti-inflammatory drugs (NSAIDs), such as naproxen.  Narcotic or central-acting agents, such as tramadol. This drug carries a risk of addiction and is generally prescribed for short-term use.  Corticosteroids. These can be given orally or as injection. This is a short-term treatment, not recommended for routine use.  Surgery to reposition the bones and relieve pain (osteotomy) or to remove loose pieces of bone and cartilage. Joint replacement may be needed in advanced states of osteoarthritis. HOME CARE INSTRUCTIONS  Your caregiver can recommend specific types of exercise. These may include:  Strengthening exercises. These are done to strengthen the muscles that support joints affected by arthritis. They can be performed with weights or with exercise bands to add resistance.  Aerobic activities. These are exercises, such as brisk walking or low-impact aerobics, that get your heart pumping. They can help keep your lungs and circulatory system in shape.  Range-of-motion activities. These keep your joints limber.  Balance and agility exercises. These help you maintain daily living skills. Learning about your condition and being actively involved in your care will help improve the course of your osteoarthritis. SEEK MEDICAL CARE IF:   You feel hot or your skin turns red.  You develop a rash in addition to your joint pain.  You have an oral temperature above 102 F (38.9 C). FOR MORE INFORMATION  National Institute of Arthritis and Musculoskeletal and Skin Diseases: www.niams.nih.gov National Institute on Aging: www.nia.nih.gov American College of Rheumatology: www.rheumatology.org Document Released: 01/10/2005 Document Revised: 04/04/2011 Document Reviewed: 04/23/2009 [employer] Patient Information 2014 [employer].  

## 2013-01-30 ENCOUNTER — Encounter: Payer: Self-pay | Admitting: Internal Medicine

## 2013-01-30 NOTE — Assessment & Plan Note (Signed)
Her plain film shows DJD and an old fracture but there is no evidence of pseudogout

## 2013-01-30 NOTE — Assessment & Plan Note (Signed)
Her BP is well controlled 

## 2013-01-30 NOTE — Assessment & Plan Note (Signed)
Will check for anti-Sm abs and histone abs to screen for SLE and drug-induced + ANA

## 2013-01-30 NOTE — Assessment & Plan Note (Signed)
Her ANA ws slightly positive so I have ordered some confirmatory tests to see if this is significant I have asked her to try zorvolex for the pain and swelling

## 2013-01-30 NOTE — Assessment & Plan Note (Signed)
Her A1C shows good control of the blood sugars 

## 2013-02-07 ENCOUNTER — Encounter: Payer: Self-pay | Admitting: Internal Medicine

## 2013-03-07 ENCOUNTER — Encounter: Payer: Self-pay | Admitting: Internal Medicine

## 2013-03-07 ENCOUNTER — Ambulatory Visit (INDEPENDENT_AMBULATORY_CARE_PROVIDER_SITE_OTHER): Payer: Medicare Other | Admitting: Internal Medicine

## 2013-03-07 VITALS — BP 130/88 | HR 83 | Temp 97.8°F | Resp 16 | Ht 67.0 in | Wt 191.0 lb

## 2013-03-07 DIAGNOSIS — M1990 Unspecified osteoarthritis, unspecified site: Secondary | ICD-10-CM

## 2013-03-07 DIAGNOSIS — M064 Inflammatory polyarthropathy: Secondary | ICD-10-CM

## 2013-03-07 DIAGNOSIS — I10 Essential (primary) hypertension: Secondary | ICD-10-CM

## 2013-03-07 NOTE — Assessment & Plan Note (Signed)
Improvement noted 

## 2013-03-07 NOTE — Patient Instructions (Signed)
Osteoarthritis Osteoarthritis is a disease that causes soreness and swelling (inflammation) of a joint. It occurs when the cartilage at the affected joint wears down. Cartilage acts as a cushion, covering the ends of bones where they meet to form a joint. Osteoarthritis is the most common form of arthritis. It often occurs in older people. The joints affected most often by this condition include those in the:  Ends of the fingers.  Thumbs.  Neck.  Lower back.  Knees.  Hips. CAUSES  Over time, the cartilage that covers the ends of bones begins to wear away. This causes bone to rub on bone, producing pain and stiffness in the affected joints.  RISK FACTORS Certain factors can increase your chances of having osteoarthritis, including:  Older age.  Excessive body weight.  Overuse of joints. SIGNS AND SYMPTOMS   Pain, swelling, and stiffness in the joint.  Over time, the joint may lose its normal shape.  Small deposits of bone (osteophytes) may grow on the edges of the joint.  Bits of bone or cartilage can break off and float inside the joint space. This may cause more pain and damage. DIAGNOSIS  Your health care provider will do a physical exam and ask about your symptoms. Various tests may be ordered, such as:  X-rays of the affected joint.  An MRI scan.  Blood tests to rule out other types of arthritis.  Joint fluid tests. This involves using a needle to draw fluid from the joint and examining the fluid under a microscope. TREATMENT  Goals of treatment are to control pain and improve joint function. Treatment plans may include:  A prescribed exercise program that allows for rest and joint relief.  A weight control plan.  Pain relief techniques, such as:  Properly applied heat and cold.  Electric pulses delivered to nerve endings under the skin (transcutaneous electrical nerve stimulation, TENS).  Massage.  Certain nutritional supplements.  Medicines to  control pain, such as:  Acetaminophen.  Nonsteroidal anti-inflammatory drugs (NSAIDs), such as naproxen.  Narcotic or central-acting agents, such as tramadol.  Corticosteroids. These can be given orally or as an injection.  Surgery to reposition the bones and relieve pain (osteotomy) or to remove loose pieces of bone and cartilage. Joint replacement may be needed in advanced states of osteoarthritis. HOME CARE INSTRUCTIONS   Only take over-the-counter or prescription medicines as directed by your health care provider. Take all medicines exactly as instructed.  Maintain a healthy weight. Follow your health care provider's instructions for weight control. This may include dietary instructions.  Exercise as directed. Your health care provider can recommend specific types of exercise. These may include:  Strengthening exercises These are done to strengthen the muscles that support joints affected by arthritis. They can be performed with weights or with exercise bands to add resistance.  Aerobic activities These are exercises, such as brisk walking or low-impact aerobics, that get your heart pumping.  Range-of-motion activities These keep your joints limber.  Balance and agility exercises These help you maintain daily living skills.  Rest your affected joints as directed by your health care provider.  Follow up with your health care provider as directed. SEEK MEDICAL CARE IF:   Your skin turns red.  You develop a rash in addition to your joint pain.  You have worsening joint pain. SEEK IMMEDIATE MEDICAL CARE IF:  You have a significant loss of weight or appetite.  You have a fever along with joint or muscle aches.  You have   night sweats. FOR MORE INFORMATION  National Institute of Arthritis and Musculoskeletal and Skin Diseases: www.niams.nih.gov National Institute on Aging: www.nia.nih.gov American College of Rheumatology: www.rheumatology.org Document Released: 01/10/2005  Document Revised: 10/31/2012 Document Reviewed: 09/17/2012 [employer] Patient Information 2014 [employer].  

## 2013-03-07 NOTE — Progress Notes (Signed)
Subjective:    Patient ID: Nicole [name], female    DOB: [birth year], 73 y.o.   MRN: [id number]  Arthritis Presents for follow-up visit. The disease course has been improving. She complains of pain and stiffness. She reports no joint swelling or joint warmth. Affected locations include the left MCP, left wrist and right MCP. Her pain is at a severity of 2/10. Pertinent negatives include no diarrhea, dry eyes, dry mouth, dysuria, fatigue, fever, pain at night, pain while resting, rash, Raynaud's syndrome, uveitis or weight loss. Her past medical history is significant for osteoarthritis. Past treatments include NSAIDs.      Review of Systems  Constitutional: Negative.  Negative for fever, chills, weight loss, diaphoresis, appetite change and fatigue.  HENT: Negative.   Eyes: Negative.   Respiratory: Negative.  Negative for cough, choking, chest tightness, shortness of breath and stridor.   Cardiovascular: Negative.  Negative for chest pain, palpitations and leg swelling.  Gastrointestinal: Negative.  Negative for nausea, abdominal pain, diarrhea and constipation.  Endocrine: Negative.   Genitourinary: Negative.  Negative for dysuria.  Musculoskeletal: Positive for arthralgias, arthritis and stiffness. Negative for back pain, gait problem, joint swelling, myalgias, neck pain and neck stiffness.  Skin: Negative.  Negative for rash.  Allergic/Immunologic: Negative.   Neurological: Negative.   Hematological: Negative.  Negative for adenopathy. Does not bruise/bleed easily.  Psychiatric/Behavioral: Negative.        Objective:   Physical Exam  Vitals reviewed. Constitutional: She is oriented to person, place, and time. She appears well-developed and well-nourished. No distress.  HENT:  Head: Normocephalic and atraumatic.  Mouth/Throat: Oropharynx is clear and moist. No oropharyngeal exudate.  Eyes: Conjunctivae are normal. Right eye exhibits no discharge. Left eye exhibits no  discharge. No scleral icterus.  Neck: Normal range of motion. Neck supple. No JVD present. No tracheal deviation present. No thyromegaly present.  Cardiovascular: Normal rate, regular rhythm, normal heart sounds and intact distal pulses.  Exam reveals no gallop and no friction rub.   No murmur heard. Pulmonary/Chest: Effort normal and breath sounds normal. No stridor. No respiratory distress. She has no wheezes. She has no rales. She exhibits no tenderness.  Abdominal: Soft. Bowel sounds are normal. She exhibits no distension and no mass. There is no tenderness. There is no rebound and no guarding.  Musculoskeletal: Normal range of motion. She exhibits no edema and no tenderness.       Right hand: Normal. She exhibits normal range of motion, no tenderness, no bony tenderness, no deformity and no swelling.       Left hand: Normal. She exhibits normal range of motion, no tenderness, no bony tenderness and no deformity. Normal strength noted.  Lymphadenopathy:    She has no cervical adenopathy.  Neurological: She is oriented to person, place, and time.  Skin: Skin is warm and dry. No rash noted. She is not diaphoretic. No erythema. No pallor.     Lab Results  Component Value Date   WBC 7.0 01/18/2013   HGB 13.8 01/18/2013   HCT 40.7 01/18/2013   PLT 254.0 01/18/2013   GLUCOSE 116* 01/29/2013   CHOL 182 01/18/2013   TRIG 231.0* 01/18/2013   HDL 38.40* 01/18/2013   LDLDIRECT 118.0 01/18/2013   LDLCALC 89 10/31/2011   ALT 24 01/29/2013   AST 21 01/29/2013   NA 138 01/29/2013   K 3.8 01/29/2013   CL 105 01/29/2013   CREATININE 1.0 01/29/2013   BUN 24* 01/29/2013   CO2 25 01/29/2013  TSH 1.34 01/29/2013   INR 0.91 03/19/2012   HGBA1C 7.1* 01/29/2013       Assessment & Plan:

## 2013-03-07 NOTE — Assessment & Plan Note (Signed)
Her BP is well controlled 

## 2013-03-13 ENCOUNTER — Ambulatory Visit: Payer: Medicare Other | Admitting: Family Medicine

## 2013-03-13 ENCOUNTER — Encounter: Payer: Self-pay | Admitting: Family Medicine

## 2013-03-13 ENCOUNTER — Ambulatory Visit (INDEPENDENT_AMBULATORY_CARE_PROVIDER_SITE_OTHER): Payer: Medicare Other | Admitting: Family Medicine

## 2013-03-13 VITALS — BP 126/80 | HR 86 | Temp 98.0°F | Resp 18 | Wt 193.8 lb

## 2013-03-13 DIAGNOSIS — M19049 Primary osteoarthritis, unspecified hand: Secondary | ICD-10-CM

## 2013-03-13 DIAGNOSIS — M19042 Primary osteoarthritis, left hand: Secondary | ICD-10-CM | POA: Insufficient documentation

## 2013-03-13 MED ORDER — LOSARTAN POTASSIUM 100 MG PO TABS: 100.0000 mg | ORAL_TABLET | Freq: Every day | ORAL | Status: DC

## 2013-03-13 NOTE — Progress Notes (Signed)
Pre-visit discussion using our clinic review tool. No additional management support is needed unless otherwise documented below in the visit note.  

## 2013-03-13 NOTE — Patient Instructions (Signed)
Goo dto meet you and Happy new year.  Stop the losartan-HCTZ for your blood pressure Start the losartan 100mg  daily for your blood pressure.  Ibuprofen 600mg  three times a day for 3 days.  Take tylenol 650 mg three times a day is the best evidence based medicine we have for arthritis.  Glucosamine sulfate 750mg  twice a day is a supplement that has been shown to help moderate to severe arthritis. Vitamin D 2000 IU daily Fish oil 2 grams daily.  Tumeric 500mg  twice daily.  Capsaicin topically up to four times a day may also help with pain. Arnica gel can help Wear gloves at night.  Come back and see me in 3 weeks.

## 2013-03-13 NOTE — Assessment & Plan Note (Signed)
Discussed with patient at great length. 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 I believe patient's uric acid level was low because this was taken during in an exacerbation which artificially lowers patient's uric acid. We will take patient off of hydrochlorothiazide to decrease the amount of uric acid resorption. We will recheck patient's blood pressure in 2 weeks to make sure it still controlled. Discuss ibuprofen short course 3 day burst. Discussed ice and heat Discuss over-the-counter medicines for her underlying osteoarthritis. Patient will come back in 3 weeks for further evaluation.

## 2013-03-13 NOTE — Progress Notes (Signed)
  [name] [address] [place], [place] [address] Phone: [phone] Subjective:    I'm seeing this patient by the request  of:  Scarlette Calico, MD   CC: Wrist pain  [id number] Nicole [name] is a 73 y.o. female coming in with complaint of wrist pain. Patient does have a past medical history significant for inflammatory arthritis. Patient has had this pain in the left wrist for quite some time. Patient left wrist did have x-rays back in January of 2015 showed significant degenerative changes. But there was no evidence of erosive arthropathy. Patient was getting better with conservative therapy and then Lasix that she starts having increasing swelling again. This seems to be worse at night but states that it can be very sore and does wake her up at 3 AM. This pain is responding to ibuprofen. Denies any numbness or tingling. 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 Patient denies any type of injury. Patient rates the severity of 9/10 when it does complain.    Past medical history, social, surgical and family history all reviewed in electronic medical record.   Review of Systems: No headache, visual changes, nausea, vomiting, diarrhea, constipation, dizziness, abdominal pain, skin rash, fevers, chills, night sweats, weight loss, swollen lymph nodes, body aches, joint swelling, muscle aches, chest pain, shortness of breath, mood changes.   Objective Blood pressure 126/80, pulse 86, temperature 98 F (36.7 C), temperature source Oral, resp. rate 18, weight 193 lb 12.8 oz (87.907 kg), SpO2 96.00%.  General: No apparent distress alert and oriented x3 mood and affect normal, dressed appropriately.  HEENT: Pupils equal, extraocular movements intact  Respiratory: Patient's speak in full sentences and does not appear short of breath  Cardiovascular: No lower extremity edema, non tender, no  erythema  Skin: Warm dry intact with no signs of infection or rash on extremities or on axial skeleton.  Abdomen: Soft nontender  Neuro: Cranial nerves II through XII are intact, neurovascularly intact in all extremities with 2+ DTRs and 2+ pulses.  Lymph: No lymphadenopathy of posterior or anterior cervical chain or axillae bilaterally.  Gait normal with good balance and coordination.  MSK:  Non tender with full range of motion and good stability and symmetric strength and tone of shoulders, elbows,  hip, knee and ankles bilaterally.  Wrist: Left Inspection revealed patient does have trace effusion of the soft tissue of the dorsal aspect of the hand and somewhat of the wrist. ROM smooth and normal with good flexion and extension and ulnar/radial deviation that is symmetrical with opposite wrist. Palpation does have tenderness to palpation over the dorsal aspect of the hand. TFCC; tendons without tenderness/ swelling No snuffbox tenderness. No tenderness over Canal of [place]. Strength 5/5 in all directions without pain. Negative Finkelstein, tinel's and phalens. Negative Watson's test.   patient did show picture where she has significant amount of swelling more than she does now.     Impression and Recommendations:     This case required medical decision making of moderate complexity.

## 2013-03-25 ENCOUNTER — Encounter: Payer: Self-pay | Admitting: Family Medicine

## 2013-03-25 ENCOUNTER — Ambulatory Visit (INDEPENDENT_AMBULATORY_CARE_PROVIDER_SITE_OTHER): Payer: Medicare Other | Admitting: Family Medicine

## 2013-03-25 VITALS — BP 132/80 | HR 78 | Temp 99.7°F | Resp 16 | Wt 196.1 lb

## 2013-03-25 DIAGNOSIS — M19042 Primary osteoarthritis, left hand: Secondary | ICD-10-CM

## 2013-03-25 DIAGNOSIS — M19049 Primary osteoarthritis, unspecified hand: Secondary | ICD-10-CM

## 2013-03-25 MED ORDER — ALLOPURINOL 100 MG PO TABS: 100.0000 mg | ORAL_TABLET | Freq: Every day | ORAL | Status: DC

## 2013-03-25 NOTE — Assessment & Plan Note (Signed)
Discussed with patient again. I do think there is likely an underlying arthritis as well as uric acid been elevated. Given her a low dose of allopurinol to try. Likely to be no side effects and warned of potential rash formation. Patient will come back and see me again in 2-3 weeks if not completely resolved. Otherwise continue all the other all modalities that she's been doing regularly.

## 2013-03-25 NOTE — Patient Instructions (Signed)
Keep doing what you are doing. Anytime cooler than 30 degrees wear gloves inside and out.  Try new medicine called allopurinol daily.  This will help decrease uric acid levels See me when you need me.

## 2013-03-25 NOTE — Progress Notes (Signed)
Pre visit review using our clinic review tool, if applicable. No additional management support is needed unless otherwise documented below in the visit note. 

## 2013-03-25 NOTE — Progress Notes (Signed)
  [name] [address], [place] [address] Phone: [phone] Subjective:    I'm seeing this patient by the request  of:  Scarlette Calico, MD   CC: Wrist pain right sided.   EYE:[id number] Nicole [name] is a 73 y.o. female coming in with complaint of wrist pain. Patient does have a past medical history significant for inflammatory arthritis. 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 Patient is coming in with right-sided flare. Patient states that over the course of a week and she is having redness of the first MTP joint. Patient states she woke up today and it was completely resolved at this time. Patient continues with her over-the-counter medications that we discussed previously as well as her [place] herbs that were helpful as well. Patient states it seems to be worse whenever it seems to be cold out. States at this point she's not having any discomfort.    Past medical history, social, surgical and family history all reviewed in electronic medical record.   Review of Systems: No headache, visual changes, nausea, vomiting, diarrhea, constipation, dizziness, abdominal pain, skin rash, fevers, chills, night sweats, weight loss, swollen lymph nodes, body aches, joint swelling, muscle aches, chest pain, shortness of breath, mood changes.   Objective Blood pressure 132/80, pulse 78, temperature 99.7 F (37.6 C), temperature source Oral, resp. rate 16, weight 196 lb 1.9 oz (88.959 kg), SpO2 98.00%.  General: No apparent distress alert and oriented x3 mood and affect normal, dressed appropriately.  HEENT: Pupils equal, extraocular movements intact  Respiratory: Patient's speak in full sentences and does not appear short of breath  Cardiovascular: No lower extremity edema, non tender, no erythema  Skin: Warm dry intact with no signs of infection or rash on extremities or on axial  skeleton.  Abdomen: Soft nontender  Neuro: Cranial nerves II through XII are intact, neurovascularly intact in all extremities with 2+ DTRs and 2+ pulses.  Lymph: No lymphadenopathy of posterior or anterior cervical chain or axillae bilaterally.  Gait normal with good balance and coordination.  MSK:  Non tender with full range of motion and good stability and symmetric strength and tone of shoulders, elbows,  hip, knee and ankles bilaterally.  Wrist: Right Inspection reveals no swelling or any erythema of the skin. Patient is nontender on palpation  ROM smooth and normal with good flexion and extension and ulnar/radial deviation that is symmetrical with opposite wrist. TFCC; tendons without tenderness/ swelling No snuffbox tenderness. No tenderness over Canal of [place]. Strength 5/5 in all directions without pain. Negative Finkelstein, tinel's and phalens. Negative Watson's test.    Impression and Recommendations:     This case required medical decision making of moderate complexity.

## 2013-05-07 ENCOUNTER — Other Ambulatory Visit: Payer: Self-pay | Admitting: Internal Medicine

## 2013-05-16 ENCOUNTER — Ambulatory Visit (INDEPENDENT_AMBULATORY_CARE_PROVIDER_SITE_OTHER): Payer: Medicare Other | Admitting: Internal Medicine

## 2013-05-16 ENCOUNTER — Other Ambulatory Visit (INDEPENDENT_AMBULATORY_CARE_PROVIDER_SITE_OTHER): Payer: Medicare Other

## 2013-05-16 VITALS — BP 144/72 | HR 82 | Temp 100.4°F | Resp 16 | Ht 68.0 in | Wt 197.0 lb

## 2013-05-16 DIAGNOSIS — E1165 Type 2 diabetes mellitus with hyperglycemia: Secondary | ICD-10-CM

## 2013-05-16 DIAGNOSIS — J209 Acute bronchitis, unspecified: Secondary | ICD-10-CM

## 2013-05-16 DIAGNOSIS — IMO0001 Reserved for inherently not codable concepts without codable children: Secondary | ICD-10-CM

## 2013-05-16 DIAGNOSIS — J309 Allergic rhinitis, unspecified: Secondary | ICD-10-CM

## 2013-05-16 LAB — HEMOGLOBIN A1C: Hgb A1c MFr Bld: 6.4 % (ref 4.6–6.5)

## 2013-05-16 LAB — MICROALBUMIN / CREATININE URINE RATIO
Creatinine,U: 79.1 mg/dL
Microalb Creat Ratio: 0.8 mg/g (ref 0.0–30.0)
Microalb, Ur: 0.6 mg/dL (ref 0.0–1.9)

## 2013-05-16 MED ORDER — AMOXICILLIN 500 MG PO CAPS: 500.0000 mg | ORAL_CAPSULE | Freq: Three times a day (TID) | ORAL | Status: DC

## 2013-05-16 NOTE — Progress Notes (Signed)
   Subjective:    Patient ID: Nicole [name], female    DOB: September 14, 1940, 73 y.o.   MRN: [id number]  HPI  Symptoms began 05/15/13 and sneezing associated with rhinitis. This has progressed to be associated with sore throat and cough productive of yellow, thick secretions. She had a chills and sweats; she had no fever until today.  There is a history of seasonal allergies pollen. Remotely she had taken steroids for severe symptoms  She has had some frontal headache & minor facial pain.  Her last A1c was 7.1% on 01/29/13. She had mild decrease in the GFR at that time. FBS 110-140.     Review of Systems  She specifically denies dental pain, nasal purulence, otic pain, or otic discharge.  The cough is not associated with wheezing or shortness of breath.  She does not have associated itchy, watery eyes.       Objective:   Physical Exam General appearance:good health ;well nourished; no acute distress or increased work of breathing is present.  No  lymphadenopathy about the head, neck, or axilla noted.   Eyes: No conjunctival inflammation or lid edema is present. There is no scleral icterus.  Ears:  External ear exam shows no significant lesions or deformities.  Otoscopic examination reveals clear canals, tympanic membranes are intact bilaterally without bulging, retraction, inflammation or discharge.  Nose:  External nasal examination shows no deformity or inflammation. Nasal mucosa are erythematous and boggy minor bleeding of the left septum without other lesions or exudates. No septal dislocation or deviation.No obstruction to airflow.   Oral exam: Dental hygiene is good; lips and gums are healthy appearing.There is no oropharyngeal erythema or exudate noted.   Neck:  No deformities,  masses, or tenderness noted.   Supple with full range of motion without pain.   Heart:  Normal rate and regular rhythm. S1 and S2 normal without gallop, murmur, click, rub or other extra sounds.    Lungs:Chest clear to auscultation; no wheezes, rhonchi,rales ,or rubs present.No increased work of breathing.    Extremities:  No cyanosis, edema, or clubbing  noted    Skin: Warm & dry w/o jaundice or tenting.         Assessment & Plan:  #1 acute bronchitis w/o bronchospasm #2  Rhinitis, allergic #3 DM Plan: See orders and recommendations

## 2013-05-16 NOTE — Progress Notes (Signed)
Pre visit review using our clinic review tool, if applicable. No additional management support is needed unless otherwise documented below in the visit note. 

## 2013-05-16 NOTE — Patient Instructions (Signed)

## 2013-05-17 ENCOUNTER — Other Ambulatory Visit: Payer: Self-pay | Admitting: Internal Medicine

## 2013-05-17 DIAGNOSIS — IMO0001 Reserved for inherently not codable concepts without codable children: Secondary | ICD-10-CM

## 2013-05-17 DIAGNOSIS — E1165 Type 2 diabetes mellitus with hyperglycemia: Principal | ICD-10-CM

## 2013-05-17 MED ORDER — SITAGLIPTIN PHOS-METFORMIN HCL 50-500 MG PO TABS: ORAL_TABLET | ORAL | Status: DC

## 2013-07-17 ENCOUNTER — Ambulatory Visit (INDEPENDENT_AMBULATORY_CARE_PROVIDER_SITE_OTHER): Payer: Medicare Other | Admitting: Internal Medicine

## 2013-07-17 ENCOUNTER — Observation Stay (HOSPITAL_COMMUNITY)
Admission: EM | Admit: 2013-07-17 | Discharge: 2013-07-19 | Disposition: A | Discharge status: Home / Self Care | Payer: Medicare Other | Attending: Internal Medicine | Admitting: Internal Medicine

## 2013-07-17 ENCOUNTER — Emergency Department (HOSPITAL_COMMUNITY): Payer: Medicare Other

## 2013-07-17 ENCOUNTER — Encounter (HOSPITAL_COMMUNITY): Payer: Self-pay | Admitting: Emergency Medicine

## 2013-07-17 ENCOUNTER — Telehealth: Payer: Self-pay | Admitting: Internal Medicine

## 2013-07-17 DIAGNOSIS — R072 Precordial pain: Principal | ICD-10-CM | POA: Insufficient documentation

## 2013-07-17 DIAGNOSIS — M542 Cervicalgia: Secondary | ICD-10-CM

## 2013-07-17 DIAGNOSIS — I10 Essential (primary) hypertension: Secondary | ICD-10-CM | POA: Insufficient documentation

## 2013-07-17 DIAGNOSIS — E669 Obesity, unspecified: Secondary | ICD-10-CM

## 2013-07-17 DIAGNOSIS — Z79899 Other long term (current) drug therapy: Secondary | ICD-10-CM | POA: Insufficient documentation

## 2013-07-17 DIAGNOSIS — E118 Type 2 diabetes mellitus with unspecified complications: Secondary | ICD-10-CM | POA: Diagnosis present

## 2013-07-17 DIAGNOSIS — R079 Chest pain, unspecified: Secondary | ICD-10-CM | POA: Diagnosis present

## 2013-07-17 DIAGNOSIS — E119 Type 2 diabetes mellitus without complications: Secondary | ICD-10-CM | POA: Insufficient documentation

## 2013-07-17 DIAGNOSIS — M8580 Other specified disorders of bone density and structure, unspecified site: Secondary | ICD-10-CM

## 2013-07-17 DIAGNOSIS — E781 Pure hyperglyceridemia: Secondary | ICD-10-CM

## 2013-07-17 DIAGNOSIS — IMO0001 Reserved for inherently not codable concepts without codable children: Secondary | ICD-10-CM

## 2013-07-17 DIAGNOSIS — M1990 Unspecified osteoarthritis, unspecified site: Secondary | ICD-10-CM | POA: Insufficient documentation

## 2013-07-17 DIAGNOSIS — Z8542 Personal history of malignant neoplasm of other parts of uterus: Secondary | ICD-10-CM | POA: Insufficient documentation

## 2013-07-17 DIAGNOSIS — R0602 Shortness of breath: Secondary | ICD-10-CM | POA: Insufficient documentation

## 2013-07-17 DIAGNOSIS — Z791 Long term (current) use of non-steroidal anti-inflammatories (NSAID): Secondary | ICD-10-CM | POA: Insufficient documentation

## 2013-07-17 DIAGNOSIS — E1165 Type 2 diabetes mellitus with hyperglycemia: Secondary | ICD-10-CM

## 2013-07-17 DIAGNOSIS — R0609 Other forms of dyspnea: Secondary | ICD-10-CM

## 2013-07-17 DIAGNOSIS — R0600 Dyspnea, unspecified: Secondary | ICD-10-CM | POA: Diagnosis present

## 2013-07-17 LAB — CBC
HCT: 43.9 % (ref 36.0–46.0)
Hemoglobin: 14.7 g/dL (ref 12.0–15.0)
MCH: 29.6 pg (ref 26.0–34.0)
MCHC: 33.5 g/dL (ref 30.0–36.0)
MCV: 88.3 fL (ref 78.0–100.0)
Platelets: 215 10*3/uL (ref 150–400)
RBC: 4.97 MIL/uL (ref 3.87–5.11)
RDW: 13.2 % (ref 11.5–15.5)
WBC: 5 10*3/uL (ref 4.0–10.5)

## 2013-07-17 LAB — TROPONIN I
Troponin I: <0.3 ng/mL (ref <0.30)
Troponin I: <0.3 ng/mL (ref <0.30)
Troponin I: <0.3 ng/mL (ref <0.30)

## 2013-07-17 LAB — BASIC METABOLIC PANEL
BUN: 19 mg/dL (ref 6–23)
CO2: 21 mEq/L (ref 19–32)
Calcium: 9.8 mg/dL (ref 8.4–10.5)
Chloride: 100 mEq/L (ref 96–112)
Creatinine, Ser: 0.8 mg/dL (ref 0.50–1.10)
GFR calc Af Amer: 85 mL/min — ABNORMAL LOW (ref >90)
GFR calc non Af Amer: 73 mL/min — ABNORMAL LOW (ref >90)
Glucose, Bld: 141 mg/dL — ABNORMAL HIGH (ref 70–99)
Potassium: 4 mEq/L (ref 3.7–5.3)
Sodium: 138 mEq/L (ref 137–147)

## 2013-07-17 LAB — GLUCOSE, CAPILLARY
Glucose-Capillary: 169 mg/dL — ABNORMAL HIGH (ref 70–99)
Glucose-Capillary: 95 mg/dL (ref 70–99)

## 2013-07-17 LAB — LIPASE, BLOOD: Lipase: 45 U/L (ref 11–59)

## 2013-07-17 LAB — PRO B NATRIURETIC PEPTIDE: Pro B Natriuretic peptide (BNP): 54.5 pg/mL (ref 0–125)

## 2013-07-17 LAB — D-DIMER, QUANTITATIVE: D-Dimer, Quant: <0.27 ug/mL-FEU (ref 0.00–0.48)

## 2013-07-17 MED ORDER — ACETAMINOPHEN 325 MG PO TABS: 650.0000 mg | ORAL_TABLET | ORAL | Status: DC | PRN

## 2013-07-17 MED ORDER — METOPROLOL SUCCINATE ER 50 MG PO TB24
50.0000 mg | ORAL_TABLET | Freq: Every day | ORAL | Status: DC
  Administered 2013-07-18 – 2013-07-19 (×2): 50 mg via ORAL
  Filled 2013-07-17 (×2): qty 1

## 2013-07-17 MED ORDER — GI COCKTAIL ~~LOC~~: 30.0000 mL | Freq: Four times a day (QID) | ORAL | Status: DC | PRN

## 2013-07-17 MED ORDER — ASPIRIN 81 MG PO CHEW
324.0000 mg | CHEWABLE_TABLET | Freq: Once | ORAL | Status: AC
  Administered 2013-07-17: 324 mg via ORAL
  Filled 2013-07-17: qty 4

## 2013-07-17 MED ORDER — INSULIN ASPART 100 UNIT/ML ~~LOC~~ SOLN: 0.0000 [IU] | Freq: Every day | SUBCUTANEOUS | Status: DC

## 2013-07-17 MED ORDER — ALLOPURINOL 100 MG PO TABS
100.0000 mg | ORAL_TABLET | Freq: Every day | ORAL | Status: DC
  Administered 2013-07-18 – 2013-07-19 (×2): 100 mg via ORAL
  Filled 2013-07-17 (×2): qty 1

## 2013-07-17 MED ORDER — ENOXAPARIN SODIUM 40 MG/0.4ML ~~LOC~~ SOLN
40.0000 mg | SUBCUTANEOUS | Status: DC
  Administered 2013-07-18: 40 mg via SUBCUTANEOUS
  Filled 2013-07-17 (×3): qty 0.4

## 2013-07-17 MED ORDER — SODIUM CHLORIDE 0.9 % IV SOLN
INTRAVENOUS | Status: DC
  Administered 2013-07-17: 12:00:00 via INTRAVENOUS

## 2013-07-17 MED ORDER — LOSARTAN POTASSIUM 50 MG PO TABS
100.0000 mg | ORAL_TABLET | Freq: Every day | ORAL | Status: DC
  Administered 2013-07-18 – 2013-07-19 (×2): 100 mg via ORAL
  Filled 2013-07-17 (×2): qty 2

## 2013-07-17 MED ORDER — ONDANSETRON HCL 4 MG/2ML IJ SOLN: 4.0000 mg | Freq: Four times a day (QID) | INTRAMUSCULAR | Status: DC | PRN

## 2013-07-17 MED ORDER — INSULIN ASPART 100 UNIT/ML ~~LOC~~ SOLN
0.0000 [IU] | Freq: Three times a day (TID) | SUBCUTANEOUS | Status: DC
  Administered 2013-07-18 (×2): 2 [IU] via SUBCUTANEOUS

## 2013-07-17 NOTE — Telephone Encounter (Signed)
Patient Information:  Caller Name: [place]  Phone: [phone]  Patient: Nicole [name], Nicole [name]  Gender: Female  DOB: [birth year]  Age: 73 Years  PCP: Scarlette Calico (Adults only)  Office Follow Up:  Does the office need to follow up with this patient?: No  Instructions For The Office: N/A  RN Note:  Denies nausea or diaphoresis.  Chest pain rated 5/10 did not radiate anywhere. Chest pain resolved after taking [place] herbs to open blood vessels; unable to report duration of chest pain. Went back to sleep. Relates pain to muscles.  FBS 147 at 0925. Fatigue present lately related to hot weather. Discussed ED vs office disposition for high risk pt (age, DM, obesity, HTN) with Lucy/office nurse who agreed patient should be seen now in [facility] ED. Pt verbalized understanding and agreed to go now.  Symptoms  Reason For Call & Symptoms: Left sided chest pain under breast and rib cage at 0500. BP 164/92 left arm at 0500.  Reviewed Health History In EMR: Yes  Reviewed Medications In EMR: Yes  Reviewed Allergies In EMR: Yes  Reviewed Surgeries / Procedures: Yes  Date of Onset of Symptoms: 07/17/2013  Treatments Tried: took [place] medicine to clear blood vessels in heart  Treatments Tried Worked: Yes  Guideline(s) Used:  Chest Pain  Disposition Per Guideline:   Go to ED Now (or to Office with PCP Approval)  Reason For Disposition Reached:   Patient sounds very sick or weak to the triager  Advice Given:  Call Back If:  Severe chest pain  Constant chest pain lasting longer than 5 minutes  Difficulty breathing  Fever  You become worse.  RN Overrode Recommendation:  Go To ED  [facility] per [facility] Lucy/office nurse

## 2013-07-17 NOTE — ED Provider Notes (Signed)
CSN: [id number]     Arrival date & time 07/17/13  1005 History   First MD Initiated Contact with Patient 07/17/13 1113     Chief Complaint  Patient presents with  . Chest Pain     (Consider location/radiation/quality/duration/timing/severity/associated sxs/prior Treatment) Patient is a 73 y.o. female presenting with chest pain. The history is provided by the patient.  Chest Pain Associated symptoms: shortness of breath   Associated symptoms: no abdominal pain, no back pain, no fever, no headache, no nausea and not vomiting    patient with onset of left-sided substernal chest pain at 5 this morning not really sure how long it lasted. Patient took [place] herbal supplement for make her vessels dilated. When she woke up again pain was resolved. However patient's had pain like this on and off for the last several months. Patient stated only 2 but neighbor stated maybe longer. Patient has risk factors of diabetes and hypertension. Blood pressure also running a little high today. Patient has not had any recurrence of the pain since this morning. Associated with some shortness of breath no nausea no vomiting. No known history of any cardiac disease other than hypertension. Pain did not radiate. Patient stated the pain was 4/10.  Past Medical History  Diagnosis Date  . Hypertension   . Low back pain   . Osteoarthritis   . Diabetes mellitus     type 2  . Bronchitis     hx of  . Cancer 2004    uterine/cervical   Past Surgical History  Procedure Laterality Date  . Abdominal hysterectomy  2004  . Tubal ligation    . Appendectomy  when 73 years old  . Incontinence surgery    . Knee arthroscopy Left   . 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 Arthritis Other   . Cancer Other     colon, lst degree relative   . Diabetes Other     st degree relative  . Hyperlipidemia Other   . Hypertension Other    History  Substance Use Topics  . Smoking status: Never Smoker   . Smokeless tobacco: Never Used  . Alcohol Use: No   OB History   Grav Para Term Preterm Abortions TAB SAB Ect Mult Living                 Review of Systems  Constitutional: Negative for fever.  HENT: Negative for congestion.   Eyes: Negative for visual disturbance.  Respiratory: Positive for chest tightness and shortness of breath.   Cardiovascular: Positive for chest pain.  Gastrointestinal: Negative for nausea, vomiting and abdominal pain.  Genitourinary: Negative for dysuria.  Musculoskeletal: Negative for back pain.  Skin: Negative for rash.  Neurological: Negative for headaches.  Hematological: Does not bruise/bleed easily.      Allergies  Review of patient's allergies indicates no known allergies.  Home Medications   Prior to Admission medications   Medication Sig Start Date End Date Taking? Authorizing Provider  acetaminophen (TYLENOL) 500 MG tablet Take 500 mg by mouth every 8 (eight) hours as needed for mild pain.    Yes Historical Provider, MD  allopurinol (ZYLOPRIM) 100 MG tablet Take 1 tablet (100 mg total) by mouth daily. 03/25/13  Yes Lyndal Pulley, DO  Cyanocobalamin (VITAMIN B-12 CR PO)  Take 1 tablet by mouth daily.   Yes Historical Provider, MD  ibuprofen (ADVIL,MOTRIN) 200 MG tablet Take 400 mg by mouth 2 (two) times daily.   Yes Historical Provider, MD  losartan (COZAAR) 100 MG tablet Take 1 tablet (100 mg total) by mouth daily. 03/13/13  Yes Lyndal Pulley, DO  metoprolol succinate (TOPROL-XL) 50 MG 24 hr tablet Take 50 mg by mouth daily. Take with or immediately following a meal.   Yes Historical Provider, MD  OVER THE COUNTER MEDICATION Take 1 capsule by mouth daily. Tumeric capsule   Yes Historical Provider, MD  sitaGLIPtin-metformin (JANUMET) 50-500 MG per tablet Take 1 tablet by mouth 2 (two)  times daily with a meal.   Yes Historical Provider, MD   BP 161/74  Pulse 63  Temp(Src) 99.5 F (37.5 C) (Oral)  Resp 13  Ht 5' 8"  (1.727 m)  Wt 200 lb (90.719 kg)  BMI 30.42 kg/m2  SpO2 91% Physical Exam  Nursing note and vitals reviewed. Constitutional: She is oriented to person, place, and time. She appears well-developed and well-nourished. No distress.  HENT:  Head: Normocephalic and atraumatic.  Mouth/Throat: Oropharynx is clear and moist.  Eyes: Conjunctivae and EOM are normal. Pupils are equal, round, and reactive to light.  Neck: Normal range of motion.  Cardiovascular: Normal rate, regular rhythm and normal heart sounds.   No murmur heard. Pulmonary/Chest: Effort normal and breath sounds normal. No respiratory distress.  Abdominal: Soft. Bowel sounds are normal. There is no tenderness.  Musculoskeletal: Normal range of motion.  Neurological: She is alert and oriented to person, place, and time. No cranial nerve deficit. She exhibits normal muscle tone. Coordination normal.  Skin: Skin is warm. No rash noted.    ED Course  Procedures (including critical care time) Labs Review Labs Reviewed  BASIC METABOLIC PANEL - Abnormal; Notable for the following:    Glucose, Bld 141 (*)    GFR calc non Af Amer 73 (*)    GFR calc Af Amer 85 (*)    All other components within normal limits  CBC  TROPONIN I  LIPASE, BLOOD  PRO B NATRIURETIC PEPTIDE  D-DIMER, QUANTITATIVE   Results for orders placed during the hospital encounter of 07/17/13  CBC      Result Value Ref Range   WBC 5.0  4.0 - 10.5 K/uL   RBC 4.97  3.87 - 5.11 MIL/uL   Hemoglobin 14.7  12.0 - 15.0 g/dL   HCT 43.9  36.0 - 46.0 %   MCV 88.3  78.0 - 100.0 fL   MCH 29.6  26.0 - 34.0 pg   MCHC 33.5  30.0 - 36.0 g/dL   RDW 13.2  11.5 - 15.5 %   Platelets 215  150 - 400 K/uL  BASIC METABOLIC PANEL      Result Value Ref Range   Sodium 138  137 - 147 mEq/L   Potassium 4.0  3.7 - 5.3 mEq/L   Chloride 100  96 -  112 mEq/L   CO2 21  19 - 32 mEq/L   Glucose, Bld 141 (*) 70 - 99 mg/dL   BUN 19  6 - 23 mg/dL   Creatinine, Ser 0.80  0.50 - 1.10 mg/dL   Calcium 9.8  8.4 - 10.5 mg/dL   GFR calc non Af Amer 73 (*) >90 mL/min   GFR calc Af Amer 85 (*) >90 mL/min  TROPONIN I      Result Value Ref Range   Troponin  I <0.30  <0.30 ng/mL  LIPASE, BLOOD      Result Value Ref Range   Lipase 45  11 - 59 U/L  PRO B NATRIURETIC PEPTIDE      Result Value Ref Range   Pro B Natriuretic peptide (BNP) 54.5  0 - 125 pg/mL  D-DIMER, QUANTITATIVE      Result Value Ref Range   D-Dimer, Quant <0.27  0.00 - 0.48 ug/mL-FEU     Imaging Review Dg Chest 2 View  07/17/2013   CLINICAL DATA:  Chest pain, history hypertension, diabetes, uterine cancer  EXAM: CHEST  2 VIEW  COMPARISON:  03/19/2012  FINDINGS: Upper normal heart size.  Calcified tortuous aorta.  Mediastinal contours and pulmonary vascularity normal.  Lungs clear.  No pleural effusion or pneumothorax.  Scattered endplate spur formation thoracic spine pre  IMPRESSION: No acute abnormalities.   Electronically Signed   By: Lavonia Dana M.D.   On: 07/17/2013 13:50     EKG Interpretation   Date/Time:  Wednesday July 17 2013 10:09:32 EDT Ventricular Rate:  75 PR Interval:  170 QRS Duration: 88 QT Interval:  362 QTC Calculation: 404 R Axis:   66 Text Interpretation:  Normal sinus rhythm Normal ECG Improved since last  EKG anteriorly and inferiorly Confirmed by Amritha Yorke  MD, Timarion Agcaoili ([id number]) on  07/17/2013 12:07:19 PM      MDM   Final diagnoses:  Chest pain at rest    Patient with left-sided substernal chest pain this morning at 5 not sure how long it lasted. Took [place] herbal remedy this post to dilate your heart vessels patient fell back to sleep the pain was gone by the time she woke up. She's had pain like this though several times on and off patient states for 2 months neighbors is maybe longer. Patient has remained chest pain-free. Patient's blood  pressure was higher than usual today. Patient given aspirin here. Patient will be admitted for chest pain rule out.   D-dimer negative no evidence of pulmonary embolism. Troponin not elevated. Labs without specific abnormalities. Patient has a history of hyper-tension and diabetes her sugar not elevated. Blood pressure initially elevated some here 110 systolic without any treatment came down into the 140s. Temporary admit orders completed.      Fredia Sorrow, MD 07/17/13 [phone]

## 2013-07-17 NOTE — ED Notes (Signed)
Pt returned from xray

## 2013-07-17 NOTE — ED Notes (Signed)
Pt states this morning she had chest discomfort around 5, also states she took a chinese herbal medication for dilating blood vessels and the chest discomfort went away. Pt states she took her pressure and it was high, was concerned and wanted to be seen here. Pt took all her morning BP medications. Denies pain at this time, AAOX4.

## 2013-07-17 NOTE — H&P (Signed)
[facility] History and Physical  [name] BTD:[id number] DOB: 1940/11/09 DOA: 07/17/2013  Referring physician: er PCP: Scarlette Calico, MD   Chief Complaint: chest pain  HPI: Nicole [name] is a 73 y.o. female  Who comes in with c/o chest pain this AM with no radiation. 5/10. Pain only lasted for a few moments and she took a [place] herb that helps with blood flow ans went back to sleep.  When she woke up, she took her BP and it was elevated above her baseline at 106Y systolically.  Patient has had "chest pain" on and off for months- all of the pains were reproducible with chest palpation.  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 +DM, +HTN.   Denies symptoms of GERD. No previous cardiac work up  In the ER, EKG and CE were negative, hospitalist were called for observation  Review of Systems:  All systems reviewed, negative unless stated above   Past Medical History  Diagnosis Date  . Hypertension   . Low back pain   . Osteoarthritis   . Diabetes mellitus     type 2  . Bronchitis     hx of  . Cancer 2004    uterine/cervical   Past Surgical History  Procedure Laterality Date  . Abdominal hysterectomy  2004  . Tubal ligation    . Appendectomy  when 73 years old  . Incontinence surgery    . Knee arthroscopy Left   . 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 She has never used smokeless tobacco. She reports that she does not drink alcohol or use illicit drugs.  No Known Allergies  Family History  Problem Relation Age of Onset  . Arthritis Other   . Cancer Other     colon, lst degree relative  . Diabetes Other     st degree relative  . Hyperlipidemia Other   . Hypertension Other      Prior to Admission medications   Medication Sig Start Date End Date  Taking? Authorizing Provider  acetaminophen (TYLENOL) 500 MG tablet Take 500 mg by mouth every 8 (eight) hours as needed for mild pain.    Yes Historical Provider, MD  allopurinol (ZYLOPRIM) 100 MG tablet Take 1 tablet (100 mg total) by mouth daily. 03/25/13  Yes Lyndal Pulley, DO  Cyanocobalamin (VITAMIN B-12 CR PO) Take 1 tablet by mouth daily.   Yes Historical Provider, MD  ibuprofen (ADVIL,MOTRIN) 200 MG tablet Take 400 mg by mouth 2 (two) times daily.   Yes Historical Provider, MD  losartan (COZAAR) 100 MG tablet Take 1 tablet (100 mg total) by mouth daily. 03/13/13  Yes Lyndal Pulley, DO  metoprolol succinate (TOPROL-XL) 50 MG 24 hr tablet Take 50 mg by mouth daily. Take with or immediately following a meal.   Yes Historical Provider, MD  OVER THE COUNTER MEDICATION Take 1 capsule by mouth daily. Tumeric capsule   Yes Historical Provider, MD  sitaGLIPtin-metformin (JANUMET) 50-500 MG per tablet Take 1 tablet by mouth 2 (two) times daily with a meal.   Yes Historical Provider, MD   Physical Exam: Filed Vitals:   07/17/13 1534  BP: 158/70  Pulse: 60  Temp: 98 F (36.7 C)  Resp: 16    BP 158/70  Pulse  60  Temp(Src) 98 F (36.7 C) (Oral)  Resp 16  Ht 5' 8" (1.727 m)  Wt 87.363 kg (192 lb 9.6 oz)  BMI 29.29 kg/m2  SpO2 98%  General:  Appears calm and comfortable Eyes: PERRL, normal lids, irises & conjunctiva ENT: grossly normal hearing, lips & tongue Neck: no LAD, masses or thyromegaly Cardiovascular: RRR, no m/r/g. No LE edema. Telemetry: SR, no arrhythmias  Respiratory: CTA bilaterally, no w/r/r. Normal respiratory effort. Abdomen: soft, ntnd Skin: no rash or induration seen on limited exam Musculoskeletal: grossly normal tone BUE/BLE Psychiatric: grossly normal mood and affect, speech fluent and appropriate Neurologic: grossly non-focal.          Labs on Admission:  Basic Metabolic Panel:  Recent Labs Lab 07/17/13 1013  NA 138  K 4.0  CL 100  CO2 21    GLUCOSE 141*  BUN 19  CREATININE 0.80  CALCIUM 9.8   Liver Function Tests: No results found for this basename: AST, ALT, ALKPHOS, BILITOT, PROT, ALBUMIN,  in the last 168 hours  Recent Labs Lab 07/17/13 1227  LIPASE 45   No results found for this basename: AMMONIA,  in the last 168 hours CBC:  Recent Labs Lab 07/17/13 1013  WBC 5.0  HGB 14.7  HCT 43.9  MCV 88.3  PLT 215   Cardiac Enzymes:  Recent Labs Lab 07/17/13 1013  TROPONINI <0.30    BNP (last 3 results)  Recent Labs  07/17/13 1227  PROBNP 54.5   CBG: No results found for this basename: GLUCAP,  in the last 168 hours  Radiological Exams on Admission: Dg Chest 2 View  07/17/2013   CLINICAL DATA:  Chest pain, history hypertension, diabetes, uterine cancer  EXAM: CHEST  2 VIEW  COMPARISON:  03/19/2012  FINDINGS: Upper normal heart size.  Calcified tortuous aorta.  Mediastinal contours and pulmonary vascularity normal.  Lungs clear.  No pleural effusion or pneumothorax.  Scattered endplate spur formation thoracic spine pre  IMPRESSION: No acute abnormalities.   Electronically Signed   By: Mark  Boles M.D.   On: 07/17/2013 13:50    EKG: Independently reviewed. NSR  Assessment/Plan Active Problems:   Type II or unspecified type diabetes mellitus without mention of complication, uncontrolled   HYPERTENSION   Chest pain   Chest pain- tele obs, cycle CE, sounds very non-cardiac in origin  DM- SSI  HTN- cont home meds   Code Status: full Family Communication: patient Disposition Plan: obs  Time spent: 75 min  VANN, JESSICA [facility] Pager [phone]  **Disclaimer: This note may have been dictated with voice recognition software. Similar sounding words can inadvertently be transcribed and this note may contain transcription errors which may not have been corrected upon publication of note.**  

## 2013-07-17 NOTE — ED Notes (Signed)
Pt. Stated, i started having some chest pain this morning so i took some herbal ingredient no more chest pain at the present. I've also had a cough for the last 2 weeks.

## 2013-07-18 ENCOUNTER — Encounter (HOSPITAL_COMMUNITY): Payer: Self-pay | Admitting: Cardiology

## 2013-07-18 ENCOUNTER — Observation Stay (HOSPITAL_COMMUNITY): Payer: Medicare Other

## 2013-07-18 DIAGNOSIS — R0989 Other specified symptoms and signs involving the circulatory and respiratory systems: Secondary | ICD-10-CM

## 2013-07-18 DIAGNOSIS — R079 Chest pain, unspecified: Secondary | ICD-10-CM

## 2013-07-18 DIAGNOSIS — R0609 Other forms of dyspnea: Secondary | ICD-10-CM

## 2013-07-18 DIAGNOSIS — E781 Pure hyperglyceridemia: Secondary | ICD-10-CM

## 2013-07-18 DIAGNOSIS — I10 Essential (primary) hypertension: Secondary | ICD-10-CM

## 2013-07-18 DIAGNOSIS — R0600 Dyspnea, unspecified: Secondary | ICD-10-CM | POA: Diagnosis present

## 2013-07-18 LAB — GLUCOSE, CAPILLARY
Glucose-Capillary: 127 mg/dL — ABNORMAL HIGH (ref 70–99)
Glucose-Capillary: 150 mg/dL — ABNORMAL HIGH (ref 70–99)
Glucose-Capillary: 132 mg/dL — ABNORMAL HIGH (ref 70–99)
Glucose-Capillary: 139 mg/dL — ABNORMAL HIGH (ref 70–99)

## 2013-07-18 LAB — TROPONIN I: Troponin I: <0.3 ng/mL (ref <0.30)

## 2013-07-18 MED ORDER — TECHNETIUM TC 99M SESTAMIBI GENERIC - CARDIOLITE
10.0000 | Freq: Once | INTRAVENOUS | Status: AC | PRN
  Administered 2013-07-18: 10 via INTRAVENOUS

## 2013-07-18 MED ORDER — SODIUM CHLORIDE 0.9 % IV SOLN
1.0000 mL/kg/h | INTRAVENOUS | Status: DC
  Administered 2013-07-19: 1 mL/kg/h via INTRAVENOUS

## 2013-07-18 MED ORDER — TECHNETIUM TC 99M SESTAMIBI GENERIC - CARDIOLITE
30.0000 | Freq: Once | INTRAVENOUS | Status: AC | PRN
  Administered 2013-07-18: 30 via INTRAVENOUS

## 2013-07-18 MED ORDER — SODIUM CHLORIDE 0.9 % IJ SOLN
3.0000 mL | INTRAMUSCULAR | Status: DC | PRN
  Administered 2013-07-19: 3 mL via INTRAVENOUS

## 2013-07-18 MED ORDER — REGADENOSON 0.4 MG/5ML IV SOLN
INTRAVENOUS | Status: AC
  Administered 2013-07-18: 0.4 mg
  Filled 2013-07-18: qty 5

## 2013-07-18 MED ORDER — SODIUM CHLORIDE 0.9 % IV SOLN: 250.0000 mL | INTRAVENOUS | Status: DC | PRN

## 2013-07-18 MED ORDER — SODIUM CHLORIDE 0.9 % IJ SOLN
3.0000 mL | Freq: Two times a day (BID) | INTRAMUSCULAR | Status: DC
  Administered 2013-07-18: 3 mL via INTRAVENOUS

## 2013-07-18 MED ORDER — REGADENOSON 0.4 MG/5ML IV SOLN
0.4000 mg | Freq: Once | INTRAVENOUS | Status: AC
  Administered 2013-07-18: 0.4 mg via INTRAVENOUS
  Filled 2013-07-18: qty 5

## 2013-07-18 MED ORDER — ASPIRIN 81 MG PO CHEW
81.0000 mg | CHEWABLE_TABLET | ORAL | Status: AC
  Administered 2013-07-19: 81 mg via ORAL
  Filled 2013-07-18: qty 1

## 2013-07-18 NOTE — Progress Notes (Signed)
Utilization review completed.  

## 2013-07-18 NOTE — Progress Notes (Signed)
Lexiscan myoview completed without complications.  Nuc results to follow.

## 2013-07-18 NOTE — Consult Note (Signed)
Admit date: 07/17/2013 Referring Physician  Dr. Inis Sizer Primary Physician  Dr.Thomas Ronnald Ramp  Primary Cardiologist  None Reason for Consultation  Chest pain  HPI: Nicole [name] is a 73 y.o. female who presented to the ER with c/o chest pain with no radiation. She had gone to bed early last night and awakened with a discomfort in her sternal area that was worse with palpation.  It was a 5/10 in intensity. Pain only lasted for a few moments and she took a [place] herb that helps with blood flow and went back to sleep. Later on she woke up again and had the same discomfort and she took her BP and it was elevated above her baseline at 270J systolically. Patient has had "chest pain" on and off for months- all of the pains were reproducible with chest palpation although she also states that it is worse with exertion.  She is concerned that it has become more frequent recently.  She also has noticed exertional fatigue and DOE which she has attributed to having to take care of her 56 yo mother.  Her cardiac risk factors include DM and HTN. She denies symptoms of GERD. No previous cardiac work up.  Cardiac enzymes are negative x 2.    PMH:   Past Medical History  Diagnosis Date  . Hypertension   . Low back pain   . Osteoarthritis   . Diabetes mellitus     type 2  . Bronchitis     hx of  . Cancer 2004    uterine/cervical     PSH:   Past Surgical History  Procedure Laterality Date  . Abdominal hysterectomy  2004  . Tubal ligation    . Appendectomy  when 73 years old  . Incontinence surgery    . Knee arthroscopy Left   . Total knee arthroplasty Left 03/28/2012    Procedure: LEFT TOTAL KNEE ARTHROPLASTY;  Surgeon: Tobi Bastos, MD;  Location: [facility];  Service: Orthopedics;  Laterality: Left;    Allergies:  Review of patient's allergies indicates no known allergies. Prior to Admit Meds:   Prescriptions prior to admission  Medication Sig Dispense Refill  . acetaminophen (TYLENOL) 500 MG tablet  Take 500 mg by mouth every 8 (eight) hours as needed for mild pain.       [name] allopurinol (ZYLOPRIM) 100 MG tablet Take 1 tablet (100 mg total) by mouth daily.  90 tablet  1  . Cyanocobalamin (VITAMIN B-12 CR PO) Take 1 tablet by mouth daily.      [name] ibuprofen (ADVIL,MOTRIN) 200 MG tablet Take 400 mg by mouth 2 (two) times daily.      [name] losartan (COZAAR) 100 MG tablet Take 1 tablet (100 mg total) by mouth daily.  90 tablet  3  . metoprolol succinate (TOPROL-XL) 50 MG 24 hr tablet Take 50 mg by mouth daily. Take with or immediately following a meal.      . OVER THE COUNTER MEDICATION Take 1 capsule by mouth daily. Tumeric capsule      . sitaGLIPtin-metformin (JANUMET) 50-500 MG per tablet Take 1 tablet by mouth 2 (two) times daily with a meal.       Fam HX:    Family History  Problem Relation Age of Onset  . Arthritis Other   . Cancer Other     colon, lst degree relative  . Diabetes Other     st degree relative  . Hyperlipidemia Other   . Hypertension Other   .  Heart attack Father   . Heart disease Father    Social HX:    History   Social History  . Marital Status: Married    Spouse Name: N/A    Number of Children: N/A  . Years of Education: N/A   Occupational History  . retired    Social History Main Topics  . Smoking status: Never Smoker   . Smokeless tobacco: Never Used  . Alcohol Use: No  . Drug Use: No  . Sexual Activity: Not Currently   Other Topics Concern  . Not on file   Social History Narrative   Regular exercise- Yes     ROS:  All 11 ROS were addressed and are negative except what is stated in the HPI  Physical Exam: Blood pressure 147/69, pulse 64, temperature 98 F (36.7 C), temperature source Oral, resp. rate 20, height _0  (1.727 m), weight 196 lb (88.905 kg), SpO2 95.00%.    General: Well developed, well nourished, in no acute distress Head: Eyes PERRLA, No xanthomas.   Normal cephalic and atramatic  Lungs:   Clear bilaterally to auscultation and  percussion. Heart:   HRRR S1 S2 Pulses are 2+ & equal.            No carotid bruit. No JVD.  No abdominal bruits. No femoral bruits. Abdomen: Bowel sounds are positive, abdomen soft and non-tender without masses Extremities:   No clubbing, cyanosis or edema.  DP +1 Neuro: Alert and oriented X 3. Psych:  Good affect, responds appropriately    Labs:   Lab Results  Component Value Date   WBC 5.0 07/17/2013   HGB 14.7 07/17/2013   HCT 43.9 07/17/2013   MCV 88.3 07/17/2013   PLT 215 07/17/2013     Recent Labs Lab 07/17/13 1013  NA 138  K 4.0  CL 100  CO2 21  BUN 19  CREATININE 0.80  CALCIUM 9.8  GLUCOSE 141*   No results found for this basename: PTT   Lab Results  Component Value Date   INR 0.91 03/19/2012   Lab Results  Component Value Date   CKTOTAL 111 09/30/2009   CKMB 2.3 09/30/2009   TROPONINI <0.30 07/18/2013     Lab Results  Component Value Date   CHOL 182 01/18/2013   CHOL 163 10/31/2011   CHOL 166 08/06/2010   Lab Results  Component Value Date   HDL 38.40* 01/18/2013   HDL 37.30* 10/31/2011   HDL 43.00 08/06/2010   Lab Results  Component Value Date   LDLCALC 89 10/31/2011   LDLCALC 85 08/06/2010   Lab Results  Component Value Date   TRIG 231.0* 01/18/2013   TRIG 182.0* 10/31/2011   TRIG 190.0* 08/06/2010   Lab Results  Component Value Date   CHOLHDL 5 01/18/2013   CHOLHDL 4 10/31/2011   CHOLHDL 4 08/06/2010   Lab Results  Component Value Date   LDLDIRECT 118.0 01/18/2013   LDLDIRECT 97.4 03/16/2010   LDLDIRECT 75.9 09/30/2009      Radiology:  Dg Chest 2 View  07/17/2013   CLINICAL DATA:  Chest pain, history hypertension, diabetes, uterine cancer  EXAM: CHEST  2 VIEW  COMPARISON:  03/19/2012  FINDINGS: Upper normal heart size.  Calcified tortuous aorta.  Mediastinal contours and pulmonary vascularity normal.  Lungs clear.  No pleural effusion or pneumothorax.  Scattered endplate spur formation thoracic spine pre  IMPRESSION: No acute abnormalities.    Electronically Signed   By: Crist Infante.D.  On: 07/17/2013 13:50    EKG:  NSR with no ST changes  ASSESSMENT:  1.  Chest pain syndrome which is somewhat atypical.  The CP can be reproduced by palpation of her chest wall but she also says it is worse with exertion.  She also states that it has become more frequent and she is now having exertional fatigue and DOE and has to slow down.  She has several CRF including family history of CAD, DM and HTN as well as post menopausal state.   2.  HTN 3.  DM  PLAN:   1.  NPO 2.  Stress myoview this am and if no ischemia ok to discharge home from cardiac standpoint.  Sueanne Margarita, MD  07/18/2013  8:08 AM

## 2013-07-18 NOTE — Progress Notes (Signed)
[facility] PROGRESS NOTE  Nicole [name] URK:[id number] DOB: 1940-08-07 DOA: 07/17/2013 PCP: Scarlette Calico, MD  Assessment/Plan: 1. Chest pain -Cardiac enzymes negative x3, patient still with some residual chest pain -Stress test with mild inducible ischemia within anterior apical segments -Await Cardiology followup for further recommendations 2 hypertension -Continue outpatient medications 3 Diabetes mellitus -Continue sliding scale insulin Code Status: full Family Communication: None at bedside Disposition Plan: To home when medically ready   Consultants:  Cardiology  Procedures:  Nuclear stress test-mild inducible ischemia within anterior apical segments. EF 78%  Antibiotics:  None  HPI/Subjective: States still some chest pain though much less  Objective: Filed Vitals:   07/18/13 1414  BP: 133/73  Pulse: 73  Temp: 97.8 F (36.6 C)  Resp: 17    Intake/Output Summary (Last 24 hours) at 07/18/13 1810 Last data filed at 07/18/13 1300  Gross per 24 hour  Intake    360 ml  Output      0 ml  Net    360 ml   Filed Weights   07/17/13 1015 07/17/13 1534 07/18/13 0400  Weight: 90.719 kg (200 lb) 87.363 kg (192 lb 9.6 oz) 88.905 kg (196 lb)    Exam:  General: alert & oriented x 3 In NAD Cardiovascular: RRR, nl S1 s2 Respiratory: CTAB, no crackles or wheezes Abdomen: soft +BS NT/ND, no masses palpable Extremities: No cyanosis and no edema     Data Reviewed: Basic Metabolic Panel:  Recent Labs Lab 07/17/13 1013  NA 138  K 4.0  CL 100  CO2 21  GLUCOSE 141*  BUN 19  CREATININE 0.80  CALCIUM 9.8   Liver Function Tests: No results found for this basename: AST, ALT, ALKPHOS, BILITOT, PROT, ALBUMIN,  in the last 168 hours  Recent Labs Lab 07/17/13 1227  LIPASE 45   No results found for this basename: AMMONIA,  in the last 168 hours CBC:  Recent Labs Lab 07/17/13 1013  WBC 5.0  HGB 14.7  HCT 43.9  MCV 88.3  PLT 215   Cardiac  Enzymes:  Recent Labs Lab 07/17/13 1013 07/17/13 1650 07/17/13 2217 07/18/13 0300  TROPONINI <0.30 <0.30 <0.30 <0.30   BNP (last 3 results)  Recent Labs  07/17/13 1227  PROBNP 54.5   CBG:  Recent Labs Lab 07/17/13 1651 07/17/13 2016 07/18/13 0749 07/18/13 1229 07/18/13 1652  GLUCAP 169* 95 127* 150* 132*    No results found for this or any previous visit (from the past 240 hour(s)).   Studies: Dg Chest 2 View  07/17/2013   CLINICAL DATA:  Chest pain, history hypertension, diabetes, uterine cancer  EXAM: CHEST  2 VIEW  COMPARISON:  03/19/2012  FINDINGS: Upper normal heart size.  Calcified tortuous aorta.  Mediastinal contours and pulmonary vascularity normal.  Lungs clear.  No pleural effusion or pneumothorax.  Scattered endplate spur formation thoracic spine pre  IMPRESSION: No acute abnormalities.   Electronically Signed   By: Lavonia Dana M.D.   On: 07/17/2013 13:50   Nm Myocar Multi W/spect W/wall Motion / Ef  07/18/2013   CLINICAL DATA:  Chest pain, diabetes, hypertension, hyperlipidemia, and dyspnea on exertion.  EXAM: MYOCARDIAL IMAGING WITH SPECT (REST AND PHARMACOLOGIC-STRESS)  GATED LEFT VENTRICULAR WALL MOTION STUDY  LEFT VENTRICULAR EJECTION FRACTION  TECHNIQUE: Standard myocardial SPECT imaging was performed after resting intravenous injection of 10 mCi Tc-61m sestamibi. Subsequently, intravenous infusion of Lexiscan was performed under the supervision of the Cardiology staff. At peak effect of the drug, 30  mCi Tc-29m sestamibi was injected intravenously and standard myocardial SPECT imaging was performed. Quantitative gated imaging was also performed to evaluate left ventricular wall motion, and estimate left ventricular ejection fraction.  COMPARISON:  Two-view chest x-ray 07/17/2013.  FINDINGS: Cardiac uptake is somewhat attenuated along the diaphragm. Uptake is otherwise normal. Contractility is within normal limits.  The stress images demonstrate slight decreased  uptake within the anterior apical segments suggesting reversible ischemia.  Contractility and wall motion is normal.  The gated images demonstrate an estimated diastolic volume of 68 mL an estimated systolic volume 15 mL. The calculated ejection fraction is 78%.  IMPRESSION: 1. Mild inducible ischemia within the anterior apical segments. 2. Normal contractility and wall motion. 3. Normal function with an estimated ejection fraction of 78%.   Electronically Signed   By: Lawrence Santiago M.D.   On: 07/18/2013 13:47    Scheduled Meds: . allopurinol  100 mg Oral Daily  . enoxaparin (LOVENOX) injection  40 mg Subcutaneous Q24H  . insulin aspart  0-15 Units Subcutaneous TID WC  . insulin aspart  0-5 Units Subcutaneous QHS  . losartan  100 mg Oral Daily  . metoprolol succinate  50 mg Oral Daily   Continuous Infusions:   Active Problems:   Type II or unspecified type diabetes mellitus without mention of complication, uncontrolled   HYPERTENSION   Chest pain   DOE (dyspnea on exertion)    Time spent: [place] [facility] Pager [phone]. If 7PM-7AM, please contact night-coverage at [email or web address], password [facility] 07/18/2013, 6:10 PM  LOS: 1 day

## 2013-07-19 ENCOUNTER — Encounter (HOSPITAL_COMMUNITY)
Admission: EM | Disposition: A | Discharge status: Home / Self Care | Payer: Self-pay | Source: Home / Self Care | Attending: Emergency Medicine

## 2013-07-19 DIAGNOSIS — I2510 Atherosclerotic heart disease of native coronary artery without angina pectoris: Secondary | ICD-10-CM

## 2013-07-19 HISTORY — PX: LEFT HEART CATHETERIZATION WITH CORONARY ANGIOGRAM: SHX5451

## 2013-07-19 LAB — CBC
HCT: 41.5 % (ref 36.0–46.0)
Hemoglobin: 13.6 g/dL (ref 12.0–15.0)
MCH: 28.9 pg (ref 26.0–34.0)
MCHC: 32.8 g/dL (ref 30.0–36.0)
MCV: 88.3 fL (ref 78.0–100.0)
Platelets: 209 10*3/uL (ref 150–400)
RBC: 4.7 MIL/uL (ref 3.87–5.11)
RDW: 13.3 % (ref 11.5–15.5)
WBC: 5.7 10*3/uL (ref 4.0–10.5)

## 2013-07-19 LAB — GLUCOSE, CAPILLARY
Glucose-Capillary: 111 mg/dL — ABNORMAL HIGH (ref 70–99)
Glucose-Capillary: 123 mg/dL — ABNORMAL HIGH (ref 70–99)
Glucose-Capillary: 103 mg/dL — ABNORMAL HIGH (ref 70–99)

## 2013-07-19 LAB — BASIC METABOLIC PANEL
BUN: 21 mg/dL (ref 6–23)
CO2: 24 mEq/L (ref 19–32)
Calcium: 9.2 mg/dL (ref 8.4–10.5)
Chloride: 101 mEq/L (ref 96–112)
Creatinine, Ser: 0.86 mg/dL (ref 0.50–1.10)
GFR calc Af Amer: 78 mL/min — ABNORMAL LOW (ref >90)
GFR calc non Af Amer: 67 mL/min — ABNORMAL LOW (ref >90)
Glucose, Bld: 134 mg/dL — ABNORMAL HIGH (ref 70–99)
Potassium: 4 mEq/L (ref 3.7–5.3)
Sodium: 139 mEq/L (ref 137–147)

## 2013-07-19 LAB — PROTIME-INR
INR: 1.01 (ref 0.00–1.49)
Prothrombin Time: 13.3 seconds (ref 11.6–15.2)

## 2013-07-19 SURGERY — LEFT HEART CATHETERIZATION WITH CORONARY ANGIOGRAM
Anesthesia: LOCAL

## 2013-07-19 MED ORDER — ASPIRIN 81 MG PO CHEW: 81.0000 mg | CHEWABLE_TABLET | Freq: Every day | ORAL | Status: DC

## 2013-07-19 MED ORDER — FENTANYL CITRATE 0.05 MG/ML IJ SOLN
INTRAMUSCULAR | Status: AC
  Filled 2013-07-19: qty 2

## 2013-07-19 MED ORDER — ONDANSETRON HCL 4 MG/2ML IJ SOLN: 4.0000 mg | Freq: Four times a day (QID) | INTRAMUSCULAR | Status: DC | PRN

## 2013-07-19 MED ORDER — OMEPRAZOLE 40 MG PO CPDR: 40.0000 mg | DELAYED_RELEASE_CAPSULE | Freq: Every day | ORAL | Status: DC

## 2013-07-19 MED ORDER — HEPARIN (PORCINE) IN NACL 2-0.9 UNIT/ML-% IJ SOLN
INTRAMUSCULAR | Status: AC
  Filled 2013-07-19: qty 1000

## 2013-07-19 MED ORDER — HEPARIN SODIUM (PORCINE) 1000 UNIT/ML IJ SOLN
INTRAMUSCULAR | Status: AC
  Filled 2013-07-19: qty 1

## 2013-07-19 MED ORDER — MIDAZOLAM HCL 2 MG/2ML IJ SOLN
INTRAMUSCULAR | Status: AC
Start: 2013-07-19 — End: 2013-07-19
  Filled 2013-07-19: qty 2

## 2013-07-19 MED ORDER — ACETAMINOPHEN 325 MG PO TABS: 650.0000 mg | ORAL_TABLET | ORAL | Status: DC | PRN

## 2013-07-19 MED ORDER — NITROGLYCERIN 0.2 MG/ML ON CALL CATH LAB
INTRAVENOUS | Status: AC
  Filled 2013-07-19: qty 1

## 2013-07-19 MED ORDER — SODIUM CHLORIDE 0.9 % IV SOLN: INTRAVENOUS | Status: DC

## 2013-07-19 MED ORDER — OXYCODONE-ACETAMINOPHEN 5-325 MG PO TABS
1.0000 | ORAL_TABLET | ORAL | Status: DC | PRN
Start: 2013-07-19 — End: 2013-07-19

## 2013-07-19 MED ORDER — VERAPAMIL HCL 2.5 MG/ML IV SOLN
INTRAVENOUS | Status: AC
  Filled 2013-07-19: qty 2

## 2013-07-19 MED ORDER — OXYCODONE HCL 5 MG PO TABS: 5.0000 mg | ORAL_TABLET | Freq: Four times a day (QID) | ORAL | Status: DC | PRN

## 2013-07-19 MED ORDER — LIDOCAINE HCL (PF) 1 % IJ SOLN
INTRAMUSCULAR | Status: AC
  Filled 2013-07-19: qty 30

## 2013-07-19 NOTE — Progress Notes (Signed)
    Subjective:  Denies CP or dyspnea   Objective:  Filed Vitals:   07/18/13 1414 07/18/13 2003 07/19/13 0000 07/19/13 0500  BP: 133/73 165/77 125/70 154/83  Pulse: 73 78 71 78  Temp: 97.8 F (36.6 C) 97.7 F (36.5 C) 97.6 F (36.4 C) 97.4 F (36.3 C)  TempSrc: Oral Oral Oral Oral  Resp: 17 18 18 18   Height:      Weight:    194 lb (87.998 kg)  SpO2: 96% 98% 97% 97%    Intake/Output from previous day:  Intake/Output Summary (Last 24 hours) at 07/19/13 0751 Last data filed at 07/18/13 1700  Gross per 24 hour  Intake    720 ml  Output      0 ml  Net    720 ml    Physical Exam: Physical exam: Well-developed well-nourished in no acute distress.  Skin is warm and dry.  HEENT is normal.  Neck is supple. Chest is clear to auscultation with normal expansion.  Cardiovascular exam is regular rate and rhythm.  Abdominal exam nontender or distended. No masses palpated. Extremities show no edema. neuro grossly intact    Lab Results: Basic Metabolic Panel:  Recent Labs  07/17/13 1013 07/19/13 0430  NA 138 139  K 4.0 4.0  CL 100 101  CO2 21 24  GLUCOSE 141* 134*  BUN 19 21  CREATININE 0.80 0.86  CALCIUM 9.8 9.2   CBC:  Recent Labs  07/17/13 1013 07/19/13 0430  WBC 5.0 5.7  HGB 14.7 13.6  HCT 43.9 41.5  MCV 88.3 88.3  PLT 215 209   Cardiac Enzymes:  Recent Labs  07/17/13 1650 07/17/13 2217 07/18/13 0300  TROPONINI <0.30 <0.30 <0.30     Assessment/Plan:  1 chest pain-enzymes negative. Nuclear study with possible anterior ischemia. Discussed options with patient and she would like definitive evaluation. I think this is appropriate. The risks and benefits of cardiac catheterization were discussed and she agrees to proceed. Continue ASA. 2 diabetes mellitus-follow CBGs. 3 hypertension-continue present blood pressure medications.  Kirk Ruths 07/19/2013, 7:51 AM

## 2013-07-19 NOTE — H&P (View-Only) (Signed)
[facility] PROGRESS NOTE  Nicole [name] PNT:[id number] DOB: 05/13/1940 DOA: 07/17/2013 PCP: Scarlette Calico, MD  Assessment/Plan: 1. Chest pain -Cardiac enzymes negative x3, patient still with some residual chest pain -Stress test with mild inducible ischemia within anterior apical segments -Await Cardiology followup for further recommendations 2 hypertension -Continue outpatient medications 3 Diabetes mellitus -Continue sliding scale insulin Code Status: full Family Communication: None at bedside Disposition Plan: To home when medically ready   Consultants:  Cardiology  Procedures:  Nuclear stress test-mild inducible ischemia within anterior apical segments. EF 78%  Antibiotics:  None  HPI/Subjective: States still some chest pain though much less  Objective: Filed Vitals:   07/18/13 1414  BP: 133/73  Pulse: 73  Temp: 97.8 F (36.6 C)  Resp: 17    Intake/Output Summary (Last 24 hours) at 07/18/13 1810 Last data filed at 07/18/13 1300  Gross per 24 hour  Intake    360 ml  Output      0 ml  Net    360 ml   Filed Weights   07/17/13 1015 07/17/13 1534 07/18/13 0400  Weight: 90.719 kg (200 lb) 87.363 kg (192 lb 9.6 oz) 88.905 kg (196 lb)    Exam:  General: alert & oriented x 3 In NAD Cardiovascular: RRR, nl S1 s2 Respiratory: CTAB, no crackles or wheezes Abdomen: soft +BS NT/ND, no masses palpable Extremities: No cyanosis and no edema     Data Reviewed: Basic Metabolic Panel:  Recent Labs Lab 07/17/13 1013  NA 138  K 4.0  CL 100  CO2 21  GLUCOSE 141*  BUN 19  CREATININE 0.80  CALCIUM 9.8   Liver Function Tests: No results found for this basename: AST, ALT, ALKPHOS, BILITOT, PROT, ALBUMIN,  in the last 168 hours  Recent Labs Lab 07/17/13 1227  LIPASE 45   No results found for this basename: AMMONIA,  in the last 168 hours CBC:  Recent Labs Lab 07/17/13 1013  WBC 5.0  HGB 14.7  HCT 43.9  MCV 88.3  PLT 215   Cardiac  Enzymes:  Recent Labs Lab 07/17/13 1013 07/17/13 1650 07/17/13 2217 07/18/13 0300  TROPONINI <0.30 <0.30 <0.30 <0.30   BNP (last 3 results)  Recent Labs  07/17/13 1227  PROBNP 54.5   CBG:  Recent Labs Lab 07/17/13 1651 07/17/13 2016 07/18/13 0749 07/18/13 1229 07/18/13 1652  GLUCAP 169* 95 127* 150* 132*    No results found for this or any previous visit (from the past 240 hour(s)).   Studies: Dg Chest 2 View  07/17/2013   CLINICAL DATA:  Chest pain, history hypertension, diabetes, uterine cancer  EXAM: CHEST  2 VIEW  COMPARISON:  03/19/2012  FINDINGS: Upper normal heart size.  Calcified tortuous aorta.  Mediastinal contours and pulmonary vascularity normal.  Lungs clear.  No pleural effusion or pneumothorax.  Scattered endplate spur formation thoracic spine pre  IMPRESSION: No acute abnormalities.   Electronically Signed   By: Lavonia Dana M.D.   On: 07/17/2013 13:50   Nm Myocar Multi W/spect W/wall Motion / Ef  07/18/2013   CLINICAL DATA:  Chest pain, diabetes, hypertension, hyperlipidemia, and dyspnea on exertion.  EXAM: MYOCARDIAL IMAGING WITH SPECT (REST AND PHARMACOLOGIC-STRESS)  GATED LEFT VENTRICULAR WALL MOTION STUDY  LEFT VENTRICULAR EJECTION FRACTION  TECHNIQUE: Standard myocardial SPECT imaging was performed after resting intravenous injection of 10 mCi Tc-47m sestamibi. Subsequently, intravenous infusion of Lexiscan was performed under the supervision of the Cardiology staff. At peak effect of the drug, 30  mCi Tc-7m sestamibi was injected intravenously and standard myocardial SPECT imaging was performed. Quantitative gated imaging was also performed to evaluate left ventricular wall motion, and estimate left ventricular ejection fraction.  COMPARISON:  Two-view chest x-ray 07/17/2013.  FINDINGS: Cardiac uptake is somewhat attenuated along the diaphragm. Uptake is otherwise normal. Contractility is within normal limits.  The stress images demonstrate slight decreased  uptake within the anterior apical segments suggesting reversible ischemia.  Contractility and wall motion is normal.  The gated images demonstrate an estimated diastolic volume of 68 mL an estimated systolic volume 15 mL. The calculated ejection fraction is 78%.  IMPRESSION: 1. Mild inducible ischemia within the anterior apical segments. 2. Normal contractility and wall motion. 3. Normal function with an estimated ejection fraction of 78%.   Electronically Signed   By: Lawrence Santiago M.D.   On: 07/18/2013 13:47    Scheduled Meds: . allopurinol  100 mg Oral Daily  . enoxaparin (LOVENOX) injection  40 mg Subcutaneous Q24H  . insulin aspart  0-15 Units Subcutaneous TID WC  . insulin aspart  0-5 Units Subcutaneous QHS  . losartan  100 mg Oral Daily  . metoprolol succinate  50 mg Oral Daily   Continuous Infusions:   Active Problems:   Type II or unspecified type diabetes mellitus without mention of complication, uncontrolled   HYPERTENSION   Chest pain   DOE (dyspnea on exertion)    Time spent: [place] [facility] Pager [phone]. If 7PM-7AM, please contact night-coverage at [email or web address], password [facility] 07/18/2013, 6:10 PM  LOS: 1 day

## 2013-07-19 NOTE — Progress Notes (Signed)
Pts TR band taken off without complications and dressing applied. Pt stayed 40 minutes with dressing on post TR band per protocol. Pt stable and d/c'd via wheelchair to private vehicle with family. No further questions per family or pt

## 2013-07-19 NOTE — CV Procedure (Signed)
     Left Heart Catheterization with Coronary Angiography  Report  Nicole [name]  73 y.o.  female 1940/07/08  Procedure Date: 07/19/2013 Referring Physician: Kirk Ruths, M.D. Primary Cardiologist: Same  INDICATIONS: Chest pain, without evidence of myocardial infarction and subtle distal anterior wall distribution possibly representing ischemia.  PROCEDURE: 1. Left heart catheterization; 2. Coronary angiography; 3. Left ventriculography  CONSENT:  The risks, benefits, and details of the procedure were explained in detail to the patient. Risks including death, stroke, heart attack, kidney injury, allergy, limb ischemia, bleeding and radiation injury were discussed.  The patient verbalized understanding and wanted to proceed.  Informed written consent was obtained.  PROCEDURE TECHNIQUE:  After Xylocaine anesthesia a 5 French Slender sheath was placed in the right radial artery with an angiocath and the modified Seldinger technique.  Coronary angiography was done using a 5 F JR 4 and JL 3.5 cm diagnostic catheter.  Left ventriculography was done using the JR 4 catheter and hand injection.   Digital images were reviewed and the case was terminated. A wrist band was applied with good hemostasis.   CONTRAST:  Total of 80 cc.  COMPLICATIONS:  None   HEMODYNAMICS:  Aortic pressure 114/64 mmHg; LV pressure 118/5 mmHg; LVEDP 26 mm mercury  ANGIOGRAPHIC DATA:   The left main coronary artery is normal.  The left anterior descending artery is mildly to moderately calcified in the proximal segment. The LAD from the ostium to the distal inferoapical segment is widely patent. The first diagonal is small and contains segmental 80% stenosis..  The left circumflex artery is patent but contains distal eccentric 50-60% narrowing before the third and fourth obtuse marginals. There is 30% proximal narrowing. No significant obstruction is felt to be present in the circumflex distribution..  The right  coronary artery is widely patent. There is distal segmental 30-40% narrowing. No high-grade obstruction is noted.   LEFT VENTRICULOGRAM:  Left ventricular angiogram was done in the 30 RAO projection and revealed 60-70% LVEF.   IMPRESSIONS:  1. 80% first diagonal, with vessel distribution being very small. No ischemia was noted in the territory covered by this vessel.  2. Widely patent LAD, circumflex, and right coronary. 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  3. Normal left ventricular systolic function  4. False positive myocardial perfusion study  5. Chest discomfort is probably noncardiac. With abnormalities noted above, risk factor modification would be in order.   RECOMMENDATION:  Risk factor modification. Eligible for discharge later today.

## 2013-07-19 NOTE — Discharge Summary (Signed)
Physician Discharge Summary  RIANN OMAN OHF:[id number] DOB: 06-Nov-1940 DOA: 07/17/2013  PCP: Scarlette Calico, MD  Admit date: 07/17/2013 Discharge date: 07/19/2013  Time spent: <30 minutes  Recommendations for Outpatient Follow-up:  Follow-up Information   Follow up with Scarlette Calico, MD. (In 1 to 2 weeks, call for appointment upon discharge)    Specialty:  Internal Medicine   Contact information:   [address] [address] [phone]       Follow up with Sinclair Grooms, MD. (As needed)    Specialty:  Cardiology   Contact information:   [address] [place] [address] [phone]        Discharge Diagnoses:  Active Problems:   Type II or unspecified type diabetes mellitus without mention of complication, uncontrolled   HYPERTENSION   Chest pain   DOE (dyspnea on exertion)   Discharge Condition: Improved/stable  Diet recommendation: Modified carbohydrate  Filed Weights   07/17/13 1534 07/18/13 0400 07/19/13 0500  Weight: 87.363 kg (192 lb 9.6 oz) 88.905 kg (196 lb) 87.998 kg (194 lb)    History of present illness:   Patient is a 73 y.o. female who presented c/o chest pain this AM with no radiation. 5/10. Pain only lasted for a few moments and she took a [place] herb that helps with blood flow ans went back to sleep. When she woke up, she took her BP and it was elevated above her baseline at 975P systolically. Patient has had "chest pain" on and off for months- all of the pains were reproducible with chest palpation. 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  +DM, +HTN.  Denies symptoms of GERD. No previous cardiac work up  In the ER, EKG and CE were negative, hospitalist were called for observation   Hospital Course:  1.Chest pain -As discussed above upon admission cardiac enzymes were cycled and came back negative x3, patient still had some some residual chest pain  -Cardiology was consulted  and saw patient and Stress test was done and showed mild inducible ischemia within anterior apical segments  -Cardiology followed up today in the cardiac cath was done and it showed 80% first diagonal with vessel distribution been very small and no ischemia noted in the territory covered by this vessel -Per Dr. Smith>> false-positive myocardial perfusion study, and chest discomfort probably noncardiac. He recommended risk factor modification with the abnormalities noted -Other possible causes of the chest pain could be musculoskeletal versus GERD. She is to continue ibuprofen when necessary and a PPI 2 hypertension  -Continue outpatient medications  3 Diabetes mellitus  -Her Accu-Cheks were monitored and she was covered with sliding scale insulin the hospital. She was diet controlled at home, she is to continue carb modified diet and follow up with her PCP for continued monitoring and further treatment as clinically appropriate.   Procedures: Nuclear stress test-mild inducible ischemia within anterior apical segments. EF 78%   Left heart catheterization 6/26 IMPRESSIONS: 1. 80% first diagonal, with vessel distribution being very small. No ischemia was noted in the territory covered by this vessel.  2. Widely patent LAD, circumflex, and right coronary. 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  3. Normal left ventricular systolic function  4. False positive myocardial perfusion study  5. Chest discomfort is probably noncardiac. With abnormalities noted above, risk factor modification would be in order  Consultations:  Cardiology  Discharge Exam: [facility]  Vitals:   07/19/13 1527  BP:   Pulse: 70  Temp:   Resp:     Exam:  General: alert & oriented x 3 In NAD  Cardiovascular: RRR, nl S1 s2  Respiratory: CTAB, no crackles or wheezes  Abdomen: soft +BS NT/ND, no masses palpable  Extremities: No cyanosis and no edema    Discharge  Instructions You were cared for by a hospitalist during your hospital stay. If you have any questions about your discharge medications or the care you received while you were in the hospital after you are discharged, you can call the unit and asked to speak with the hospitalist on call if the hospitalist that took care of you is not available. Once you are discharged, your primary care physician will handle any further medical issues. Please note that NO REFILLS for any discharge medications will be authorized once you are discharged, as it is imperative that you return to your primary care physician (or establish a relationship with a primary care physician if you do not have one) for your aftercare needs so that they can reassess your need for medications and monitor your lab values.  Discharge Instructions   Diet Carb Modified    Complete by:  As directed      Increase activity slowly    Complete by:  As directed             Medication List         acetaminophen 500 MG tablet  Commonly known as:  TYLENOL  Take 500 mg by mouth every 8 (eight) hours as needed for mild pain.     allopurinol 100 MG tablet  Commonly known as:  ZYLOPRIM  Take 1 tablet (100 mg total) by mouth daily.     aspirin 81 MG chewable tablet  Chew 1 tablet (81 mg total) by mouth daily.  Start taking on:  07/20/2013     ibuprofen 200 MG tablet  Commonly known as:  ADVIL,MOTRIN  Take 400 mg by mouth 2 (two) times daily.     losartan 100 MG tablet  Commonly known as:  COZAAR  Take 1 tablet (100 mg total) by mouth daily.     metoprolol succinate 50 MG 24 hr tablet  Commonly known as:  TOPROL-XL  Take 50 mg by mouth daily. Take with or immediately following a meal.     omeprazole 40 MG capsule  Commonly known as:  PRILOSEC  Take 1 capsule (40 mg total) by mouth daily.     OVER THE COUNTER MEDICATION  Take 1 capsule by mouth daily. Tumeric capsule     oxyCODONE 5 MG immediate release tablet  Commonly known  as:  ROXICODONE  Take 1 tablet (5 mg total) by mouth every 6 (six) hours as needed for severe pain.     sitaGLIPtin-metformin 50-500 MG per tablet  Commonly known as:  JANUMET  Take 1 tablet by mouth 2 (two) times daily with a meal.     VITAMIN B-12 CR PO  Take 1 tablet by mouth daily.       No Known Allergies     Follow-up Information   Follow up with Scarlette Calico, MD. (In 1 to 2 weeks, call for appointment upon discharge)    Specialty:  Internal Medicine   Contact information:   [address] [address] [phone]       Follow up with Sinclair Grooms, MD. (As needed)    Specialty:  Cardiology   Contact information:   [address] [place] [address] [phone]        The results of significant diagnostics from this hospitalization (including imaging, microbiology, ancillary and laboratory) are listed below for reference.    Significant Diagnostic Studies: Dg Chest 2 View  07/17/2013   CLINICAL DATA:  Chest pain, history hypertension, diabetes, uterine cancer  EXAM: CHEST  2 VIEW  COMPARISON:  03/19/2012  FINDINGS: Upper normal heart size.  Calcified tortuous aorta.  Mediastinal contours and pulmonary vascularity normal.  Lungs clear.  No pleural effusion or pneumothorax.  Scattered endplate spur formation thoracic spine pre  IMPRESSION: No acute abnormalities.   Electronically Signed   By: Lavonia Dana M.D.   On: 07/17/2013 13:50   Nm Myocar Multi W/spect W/wall Motion / Ef  07/18/2013   CLINICAL DATA:  Chest pain, diabetes, hypertension, hyperlipidemia, and dyspnea on exertion.  EXAM: MYOCARDIAL IMAGING WITH SPECT (REST AND PHARMACOLOGIC-STRESS)  GATED LEFT VENTRICULAR WALL MOTION STUDY  LEFT VENTRICULAR EJECTION FRACTION  TECHNIQUE: Standard myocardial SPECT imaging was performed after resting intravenous injection of 10 mCi Tc-70m sestamibi. Subsequently, intravenous infusion of Lexiscan was performed under the  supervision of the Cardiology staff. At peak effect of the drug, 30 mCi Tc-13m sestamibi was injected intravenously and standard myocardial SPECT imaging was performed. Quantitative gated imaging was also performed to evaluate left ventricular wall motion, and estimate left ventricular ejection fraction.  COMPARISON:  Two-view chest x-ray 07/17/2013.  FINDINGS: Cardiac uptake is somewhat attenuated along the diaphragm. Uptake is otherwise normal. Contractility is within normal limits.  The stress images demonstrate slight decreased uptake within the anterior apical segments suggesting reversible ischemia.  Contractility and wall motion is normal.  The gated images demonstrate an estimated diastolic volume of 68 mL an estimated systolic volume 15 mL. The calculated ejection fraction is 78%.  IMPRESSION: 1. Mild inducible ischemia within the anterior apical segments. 2. Normal contractility and wall motion. 3. Normal function with an estimated ejection fraction of 78%.   Electronically Signed   By: Lawrence Santiago M.D.   On: 07/18/2013 13:47    Microbiology: No results found for this or any previous visit (from the past 240 hour(s)).   Labs: Basic Metabolic Panel:  Recent Labs Lab 07/17/13 1013 07/19/13 0430  NA 138 139  K 4.0 4.0  CL 100 101  CO2 21 24  GLUCOSE 141* 134*  BUN 19 21  CREATININE 0.80 0.86  CALCIUM 9.8 9.2   Liver Function Tests: No results found for this basename: AST, ALT, ALKPHOS, BILITOT, PROT, ALBUMIN,  in the last 168 hours  Recent Labs Lab 07/17/13 1227  LIPASE 45   No results found for this basename: AMMONIA,  in the last 168 hours CBC:  Recent Labs Lab 07/17/13 1013 07/19/13 0430  WBC 5.0 5.7  HGB 14.7 13.6  HCT 43.9 41.5  MCV 88.3 88.3  PLT 215 209   Cardiac Enzymes:  Recent Labs Lab 07/17/13 1013 07/17/13 1650 07/17/13 2217 07/18/13 0300  TROPONINI <0.30 <0.30 <0.30 <0.30   BNP: BNP (last 3 results)  Recent Labs  07/17/13 1227  PROBNP  54.5   CBG:  Recent Labs Lab 07/18/13 1652 07/18/13 2002 07/19/13 0758 07/19/13 1153 07/19/13 1632  GLUCAP 132* 139* 111* 123* 103*       Signed:  Doaa Kendzierski C  [facility] 07/19/2013, 5:15 PM

## 2013-07-19 NOTE — Interval H&P Note (Signed)
Cath Lab Visit (complete for each Cath Lab visit)  Clinical Evaluation Leading to the Procedure:   ACS: No.  Non-ACS:    Anginal Classification: CCS III  Anti-ischemic medical therapy: Maximal Therapy (2 or more classes of medications)  Non-Invasive Test Results: Low-risk stress test findings: cardiac mortality <1%/year  Prior CABG: No previous CABG      History and Physical Interval Note:  07/19/2013 2:52 PM  [name]  has presented today for surgery, with the diagnosis of cp  The various methods of treatment have been discussed with the patient and family. After consideration of risks, benefits and other options for treatment, the patient has consented to  Procedure(s): LEFT HEART CATHETERIZATION WITH CORONARY ANGIOGRAM (N/A) as a surgical intervention .  The patient's history has been reviewed, patient examined, no change in status, stable for surgery.  I have reviewed the patient's chart and labs.  Questions were answered to the patient's satisfaction.     Sinclair Grooms

## 2013-08-05 ENCOUNTER — Encounter: Payer: Self-pay | Admitting: Internal Medicine

## 2013-08-05 ENCOUNTER — Ambulatory Visit (INDEPENDENT_AMBULATORY_CARE_PROVIDER_SITE_OTHER): Payer: Medicare Other | Admitting: Internal Medicine

## 2013-08-05 VITALS — BP 130/76 | HR 87 | Temp 98.2°F | Resp 16 | Ht 68.0 in | Wt 194.8 lb

## 2013-08-05 DIAGNOSIS — E785 Hyperlipidemia, unspecified: Secondary | ICD-10-CM

## 2013-08-05 DIAGNOSIS — E1165 Type 2 diabetes mellitus with hyperglycemia: Secondary | ICD-10-CM

## 2013-08-05 DIAGNOSIS — IMO0001 Reserved for inherently not codable concepts without codable children: Secondary | ICD-10-CM

## 2013-08-05 DIAGNOSIS — I10 Essential (primary) hypertension: Secondary | ICD-10-CM

## 2013-08-05 NOTE — Progress Notes (Signed)
Pre visit review using our clinic review tool, if applicable. No additional management support is needed unless otherwise documented below in the visit note. 

## 2013-08-05 NOTE — Progress Notes (Signed)
   Subjective:    Patient ID: Nicole [name], female    DOB: 11-Aug-1940, 73 y.o.   MRN: [id number]  Hypertension This is a chronic problem. The current episode started more than 1 year ago. The problem is unchanged. The problem is controlled. Pertinent negatives include no anxiety, blurred vision, chest pain, headaches, malaise/fatigue, neck pain, orthopnea, palpitations, peripheral edema, PND, shortness of breath or sweats. Past treatments include angiotensin blockers and beta blockers. The current treatment provides moderate improvement. Compliance problems include diet and exercise.       Review of Systems  Constitutional: Negative.  Negative for fever, chills, malaise/fatigue, diaphoresis, activity change, appetite change, fatigue and unexpected weight change.  HENT: Negative.   Eyes: Negative.  Negative for blurred vision.  Respiratory: Negative.  Negative for apnea, cough, choking, chest tightness, shortness of breath, wheezing and stridor.   Cardiovascular: Negative.  Negative for chest pain, palpitations, orthopnea, leg swelling and PND.  Gastrointestinal: Negative.  Negative for nausea, vomiting, abdominal pain, diarrhea, constipation and blood in stool.  Endocrine: Negative.  Negative for polydipsia, polyphagia and polyuria.  Genitourinary: Negative.   Musculoskeletal: Negative.  Negative for arthralgias, back pain, myalgias, neck pain and neck stiffness.  Skin: Negative.  Negative for rash.  Allergic/Immunologic: Negative.   Neurological: Negative.  Negative for dizziness, tremors, seizures, syncope, facial asymmetry, speech difficulty, weakness, light-headedness, numbness and headaches.  Hematological: Negative.  Negative for adenopathy. Does not bruise/bleed easily.  Psychiatric/Behavioral: Negative.        Objective:   Physical Exam  Vitals reviewed. Constitutional: She is oriented to person, place, and time. She appears well-developed and well-nourished. No distress.    HENT:  Head: Normocephalic and atraumatic.  Mouth/Throat: Oropharynx is clear and moist. No oropharyngeal exudate.  Eyes: Conjunctivae are normal. Right eye exhibits no discharge. Left eye exhibits no discharge. No scleral icterus.  Neck: Normal range of motion. Neck supple. No JVD present. No tracheal deviation present. No thyromegaly present.  Cardiovascular: Normal rate, regular rhythm, normal heart sounds and intact distal pulses.  Exam reveals no gallop and no friction rub.   No murmur heard. Pulmonary/Chest: Effort normal and breath sounds normal. No stridor. No respiratory distress. She has no wheezes. She has no rales. She exhibits no tenderness.  Abdominal: Soft. Bowel sounds are normal. She exhibits no distension and no mass. There is no tenderness. There is no rebound and no guarding.  Musculoskeletal: Normal range of motion. She exhibits no edema and no tenderness.  Lymphadenopathy:    She has no cervical adenopathy.  Neurological: She is oriented to person, place, and time.  Skin: Skin is warm and dry. No rash noted. She is not diaphoretic. No erythema. No pallor.     Lab Results  Component Value Date   WBC 5.7 07/19/2013   HGB 13.6 07/19/2013   HCT 41.5 07/19/2013   PLT 209 07/19/2013   GLUCOSE 134* 07/19/2013   CHOL 182 01/18/2013   TRIG 231.0* 01/18/2013   HDL 38.40* 01/18/2013   LDLDIRECT 118.0 01/18/2013   LDLCALC 89 10/31/2011   ALT 24 01/29/2013   AST 21 01/29/2013   NA 139 07/19/2013   K 4.0 07/19/2013   CL 101 07/19/2013   CREATININE 0.86 07/19/2013   BUN 21 07/19/2013   CO2 24 07/19/2013   TSH 1.34 01/29/2013   INR 1.01 07/19/2013   HGBA1C 6.4 05/16/2013   MICROALBUR 0.6 05/16/2013       Assessment & Plan:

## 2013-08-05 NOTE — Patient Instructions (Signed)
Type 2 Diabetes Mellitus, Adult Type 2 diabetes mellitus, often simply referred to as type 2 diabetes, is a long-lasting (chronic) disease. In type 2 diabetes, the pancreas does not make enough insulin (a hormone), the cells are less responsive to the insulin that is made (insulin resistance), or both. Normally, insulin moves sugars from food into the tissue cells. The tissue cells use the sugars for energy. The lack of insulin or the lack of normal response to insulin causes excess sugars to build up in the blood instead of going into the tissue cells. As a result, high blood sugar (hyperglycemia) develops. The effect of high sugar (glucose) levels can cause many complications. Type 2 diabetes was also previously called adult-onset diabetes but it can occur at any age.  RISK FACTORS  A person is predisposed to developing type 2 diabetes if someone in the family has the disease and also has one or more of the following primary risk factors:  Overweight.  An inactive lifestyle.  A history of consistently eating high-calorie foods. Maintaining a normal weight and regular physical activity can reduce the chance of developing type 2 diabetes. SYMPTOMS  A person with type 2 diabetes may not show symptoms initially. The symptoms of type 2 diabetes appear slowly. The symptoms include:  Increased thirst (polydipsia).  Increased urination (polyuria).  Increased urination during the night (nocturia).  Weight loss. This weight loss may be rapid.  Frequent, recurring infections.  Tiredness (fatigue).  Weakness.  Vision changes, such as blurred vision.  Fruity smell to your breath.  Abdominal pain.  Nausea or vomiting.  Cuts or bruises which are slow to heal.  Tingling or numbness in the hands or feet. DIAGNOSIS Type 2 diabetes is frequently not diagnosed until complications of diabetes are present. Type 2 diabetes is diagnosed when symptoms or complications are present and when blood  glucose levels are increased. Your blood glucose level may be checked by one or more of the following blood tests:  A fasting blood glucose test. You will not be allowed to eat for at least 8 hours before a blood sample is taken.  A random blood glucose test. Your blood glucose is checked at any time of the day regardless of when you ate.  A hemoglobin A1c blood glucose test. A hemoglobin A1c test provides information about blood glucose control over the previous 3 months.  An oral glucose tolerance test (OGTT). Your blood glucose is measured after you have not eaten (fasted) for 2 hours and then after you drink a glucose-containing beverage. TREATMENT   You may need to take insulin or diabetes medicine daily to keep blood glucose levels in the desired range.  If you use insulin, you may need to adjust the dosage depending on the carbohydrates that you eat with each meal or snack. The treatment goal is to maintain the before meal blood sugar (preprandial glucose) level at 70-130 mg/dL. HOME CARE INSTRUCTIONS   Have your hemoglobin A1c level checked twice a year.  Perform daily blood glucose monitoring as directed by your health care provider.  Monitor urine ketones when you are ill and as directed by your health care provider.  Take your diabetes medicine or insulin as directed by your health care provider to maintain your blood glucose levels in the desired range.  Never run out of diabetes medicine or insulin. It is needed every day.  If you are using insulin, you may need to adjust the amount of insulin given based on your intake   of carbohydrates. Carbohydrates can raise blood glucose levels but need to be included in your diet. Carbohydrates provide vitamins, minerals, and fiber which are an essential part of a healthy diet. Carbohydrates are found in fruits, vegetables, whole grains, dairy products, legumes, and foods containing added sugars.  Eat healthy foods. You should make an  appointment to see a registered dietitian to help you create an eating plan that is right for you.  Lose weight if overweight.  Carry a medical alert card or wear your medical alert jewelry.  Carry a 15 gram carbohydrate snack with you at all times to treat low blood glucose (hypoglycemia). Some examples of 15 gram carbohydrate snacks include:  Glucose tablets, 3 or 4  Raisins, 2 tablespoons (24 grams)  Jelly beans, 6  Animal crackers, 8  Regular pop, 4 ounces (120 mL)  Gummy treats, 9  Recognize hypoglycemia. Hypoglycemia occurs with blood glucose levels of 70 mg/dL and below. The risk for hypoglycemia increases when fasting or skipping meals, during or after intense exercise, and during sleep. Hypoglycemia symptoms can include:  Tremors or shakes.  Decreased ability to concentrate.  Sweating.  Increased heart rate.  Headache.  Dry mouth.  Hunger.  Irritability.  Anxiety.  Restless sleep.  Altered speech or coordination.  Confusion.  Treat hypoglycemia promptly. If you are alert and able to safely swallow, follow the 15:15 rule:  Take 15-20 grams of rapid-acting glucose or carbohydrate. Rapid-acting options include glucose gel, glucose tablets, or 4 ounces (120 mL) of fruit juice, regular soda, or low fat milk.  Check your blood glucose level 15 minutes after taking the glucose.  Take 15-20 grams more of glucose if the repeat blood glucose level is still 70 mg/dL or below.  Eat a meal or snack within 1 hour once blood glucose levels return to normal.  Be alert to feeling very thirsty and urinating more frequently than usual, which are early signs of hyperglycemia. An early awareness of hyperglycemia allows for prompt treatment. Treat hyperglycemia as directed by your health care provider.  Engage in at least 150 minutes of moderate-intensity physical activity a week, spread over at least 3 days of the week or as directed by your health care provider. In  addition, you should engage in resistance exercise at least 2 times a week or as directed by your health care provider.  Adjust your medicine and food intake as needed if you start a new exercise or sport.  Follow your sick day plan at any time you are unable to eat or drink as usual.  Avoid tobacco use.  Limit alcohol intake to no more than 1 drink per day for nonpregnant women and 2 drinks per day for men. You should drink alcohol only when you are also eating food. Talk with your health care provider whether alcohol is safe for you. Tell your health care provider if you drink alcohol several times a week.  Follow up with your health care provider regularly.  Schedule an eye exam soon after the diagnosis of type 2 diabetes and then annually.  Perform daily skin and foot care. Examine your skin and feet daily for cuts, bruises, redness, nail problems, bleeding, blisters, or sores. A foot exam by a health care provider should be done annually.  Brush your teeth and gums at least twice a day and floss at least once a day. Follow up with your dentist regularly.  Share your diabetes management plan with your workplace or school.  Stay up-to-date with   immunizations.  Learn to manage stress.  Obtain ongoing diabetes education and support as needed.  Participate in, or seek rehabilitation as needed to maintain or improve independence and quality of life. Request a physical or occupational therapy referral if you are having foot or hand numbness or difficulties with grooming, dressing, eating, or physical activity. SEEK MEDICAL CARE IF:   You are unable to eat food or drink fluids for more than 6 hours.  You have nausea and vomiting for more than 6 hours.  Your blood glucose level is over 240 mg/dL.  There is a change in mental status.  You develop an additional serious illness.  You have diarrhea for more than 6 hours.  You have been sick or have had a fever for a couple of days  and are not getting better.  You have pain during any physical activity.  SEEK IMMEDIATE MEDICAL CARE IF:  You have difficulty breathing.  You have moderate to large ketone levels. MAKE SURE YOU:  Understand these instructions.  Will watch your condition.  Will get help right away if you are not doing well or get worse. Document Released: 01/10/2005 Document Revised: 01/15/2013 Document Reviewed: 08/09/2011 Exit[employer] Patient Information 2015 [employer]. This information is not intended to replace advice given to you by your health care provider. Make sure you discuss any questions you have with your health care provider.  

## 2013-08-06 ENCOUNTER — Encounter: Payer: Self-pay | Admitting: Internal Medicine

## 2013-08-06 NOTE — Assessment & Plan Note (Signed)
She will not take a statin 

## 2013-08-06 NOTE — Assessment & Plan Note (Signed)
Her blood sugars are well controlled 

## 2013-08-06 NOTE — Assessment & Plan Note (Signed)
Her BP is well controlled 

## 2013-08-12 ENCOUNTER — Other Ambulatory Visit: Payer: Self-pay | Admitting: Internal Medicine

## 2013-08-27 LAB — HM DIABETES EYE EXAM

## 2013-08-30 ENCOUNTER — Encounter: Payer: Self-pay | Admitting: Gastroenterology

## 2013-08-30 ENCOUNTER — Encounter: Payer: Self-pay | Admitting: Internal Medicine

## 2013-09-19 ENCOUNTER — Telehealth: Payer: Self-pay | Admitting: *Deleted

## 2013-09-19 MED ORDER — ALLOPURINOL 100 MG PO TABS: 100.0000 mg | ORAL_TABLET | Freq: Every day | ORAL | Status: DC

## 2013-09-19 NOTE — Telephone Encounter (Signed)
Refill done.  

## 2013-11-06 ENCOUNTER — Other Ambulatory Visit: Payer: Self-pay | Admitting: Internal Medicine

## 2013-11-06 MED ORDER — HYDROCOD POLST-CHLORPHEN POLST 10-8 MG/5ML PO LQCR: 5.0000 mL | Freq: Two times a day (BID) | ORAL | Status: DC | PRN

## 2013-11-08 ENCOUNTER — Other Ambulatory Visit: Payer: Self-pay

## 2013-11-22 ENCOUNTER — Encounter: Payer: Self-pay | Admitting: Internal Medicine

## 2013-11-22 ENCOUNTER — Ambulatory Visit (INDEPENDENT_AMBULATORY_CARE_PROVIDER_SITE_OTHER): Payer: Medicare Other | Admitting: Internal Medicine

## 2013-11-22 ENCOUNTER — Other Ambulatory Visit (INDEPENDENT_AMBULATORY_CARE_PROVIDER_SITE_OTHER): Payer: Medicare Other

## 2013-11-22 VITALS — BP 140/84 | HR 94 | Temp 98.1°F | Resp 16 | Ht 68.0 in | Wt 197.0 lb

## 2013-11-22 DIAGNOSIS — E785 Hyperlipidemia, unspecified: Secondary | ICD-10-CM

## 2013-11-22 DIAGNOSIS — Z23 Encounter for immunization: Secondary | ICD-10-CM

## 2013-11-22 DIAGNOSIS — E781 Pure hyperglyceridemia: Secondary | ICD-10-CM

## 2013-11-22 DIAGNOSIS — E118 Type 2 diabetes mellitus with unspecified complications: Secondary | ICD-10-CM

## 2013-11-22 DIAGNOSIS — I10 Essential (primary) hypertension: Secondary | ICD-10-CM

## 2013-11-22 LAB — LIPID PANEL
Cholesterol: 178 mg/dL (ref 0–200)
HDL: 43.4 mg/dL (ref >39.00)
NonHDL: 134.6
Total CHOL/HDL Ratio: 4
Triglycerides: 312 mg/dL — ABNORMAL HIGH (ref 0.0–149.0)
VLDL: 62.4 mg/dL — ABNORMAL HIGH (ref 0.0–40.0)

## 2013-11-22 LAB — CBC WITH DIFFERENTIAL/PLATELET
Basophils Absolute: 0 10*3/uL (ref 0.0–0.1)
Basophils Relative: 0.4 % (ref 0.0–3.0)
Eosinophils Absolute: 0.1 10*3/uL (ref 0.0–0.7)
Eosinophils Relative: 2.4 % (ref 0.0–5.0)
HCT: 43 % (ref 36.0–46.0)
Hemoglobin: 14.3 g/dL (ref 12.0–15.0)
Lymphocytes Relative: 21.3 % (ref 12.0–46.0)
Lymphs Abs: 1.3 10*3/uL (ref 0.7–4.0)
MCHC: 33.3 g/dL (ref 30.0–36.0)
MCV: 87.6 fl (ref 78.0–100.0)
Monocytes Absolute: 0.6 10*3/uL (ref 0.1–1.0)
Monocytes Relative: 9 % (ref 3.0–12.0)
Neutro Abs: 4.2 10*3/uL (ref 1.4–7.7)
Neutrophils Relative %: 66.9 % (ref 43.0–77.0)
Platelets: 229 10*3/uL (ref 150.0–400.0)
RBC: 4.9 Mil/uL (ref 3.87–5.11)
RDW: 13.6 % (ref 11.5–15.5)
WBC: 6.3 10*3/uL (ref 4.0–10.5)

## 2013-11-22 LAB — HEMOGLOBIN A1C: Hgb A1c MFr Bld: 7.6 % — ABNORMAL HIGH (ref 4.6–6.5)

## 2013-11-22 LAB — BASIC METABOLIC PANEL
BUN: 20 mg/dL (ref 6–23)
CO2: 21 mEq/L (ref 19–32)
Calcium: 9.4 mg/dL (ref 8.4–10.5)
Chloride: 106 mEq/L (ref 96–112)
Creatinine, Ser: 0.9 mg/dL (ref 0.4–1.2)
GFR: 62.39 mL/min (ref >60.00)
Glucose, Bld: 132 mg/dL — ABNORMAL HIGH (ref 70–99)
Potassium: 4.1 mEq/L (ref 3.5–5.1)
Sodium: 138 mEq/L (ref 135–145)

## 2013-11-22 LAB — TSH: TSH: 0.85 u[IU]/mL (ref 0.35–4.50)

## 2013-11-22 LAB — LDL CHOLESTEROL, DIRECT: Direct LDL: 84.9 mg/dL

## 2013-11-22 NOTE — Progress Notes (Signed)
Pre visit review using our clinic review tool, if applicable. No additional management support is needed unless otherwise documented below in the visit note. 

## 2013-11-22 NOTE — Patient Instructions (Signed)

## 2013-11-22 NOTE — Progress Notes (Signed)
Subjective:    Patient ID: Nicole [name], female    DOB: 12-26-1940, 73 y.o.   MRN: [id number]  Diabetes She presents for her follow-up diabetic visit. She has type 2 diabetes mellitus. Her disease course has been worsening. There are no hypoglycemic associated symptoms. Associated symptoms include polydipsia. Pertinent negatives for diabetes include no blurred vision, no chest pain, no fatigue, no foot paresthesias, no foot ulcerations, no polyphagia, no polyuria, no visual change, no weakness and no weight loss. There are no hypoglycemic complications. There are no diabetic complications. Current diabetic treatment includes oral agent (dual therapy). She is compliant with treatment most of the time. Her weight is stable. She is following a generally healthy diet. Meal planning includes avoidance of concentrated sweets. She participates in exercise intermittently. There is no change in her home blood glucose trend. An ACE inhibitor/angiotensin II receptor blocker is being taken. She does not see a podiatrist.Eye exam is current.      Review of Systems  Constitutional: Negative.  Negative for fever, chills, weight loss, diaphoresis, appetite change and fatigue.  HENT: Negative.   Eyes: Negative.  Negative for blurred vision.  Respiratory: Negative.  Negative for cough, choking, chest tightness, shortness of breath and stridor.   Cardiovascular: Negative.  Negative for chest pain, palpitations and leg swelling.  Gastrointestinal: Negative.  Negative for nausea, vomiting, abdominal pain, diarrhea, constipation and blood in stool.  Endocrine: Positive for polydipsia. Negative for polyphagia and polyuria.  Genitourinary: Negative.   Musculoskeletal: Negative.  Negative for myalgias, back pain, joint swelling, arthralgias and gait problem.  Skin: Negative.  Negative for rash.  Allergic/Immunologic: Negative.   Neurological: Negative.  Negative for weakness.  Hematological: Negative.  Negative  for adenopathy. Does not bruise/bleed easily.  Psychiatric/Behavioral: Negative.        Objective:   Physical Exam  Constitutional: She is oriented to person, place, and time. She appears well-developed and well-nourished. No distress.  HENT:  Head: Normocephalic and atraumatic.  Mouth/Throat: Oropharynx is clear and moist. No oropharyngeal exudate.  Eyes: Conjunctivae are normal. Right eye exhibits no discharge. Left eye exhibits no discharge. No scleral icterus.  Neck: Normal range of motion. Neck supple. No JVD present. No tracheal deviation present. No thyromegaly present.  Cardiovascular: Normal rate, regular rhythm, normal heart sounds and intact distal pulses.  Exam reveals no gallop and no friction rub.   No murmur heard. Pulmonary/Chest: Effort normal and breath sounds normal. No stridor. No respiratory distress. She has no wheezes. She has no rales. She exhibits no tenderness.  Abdominal: Soft. Bowel sounds are normal. She exhibits no distension and no mass. There is no tenderness. There is no rebound and no guarding.  Musculoskeletal: Normal range of motion. She exhibits no edema or tenderness.  Lymphadenopathy:    She has no cervical adenopathy.  Neurological: She is oriented to person, place, and time.  Skin: Skin is warm and dry. No rash noted. She is not diaphoretic. No erythema. No pallor.  Vitals reviewed.    Lab Results  Component Value Date   WBC 5.7 07/19/2013   HGB 13.6 07/19/2013   HCT 41.5 07/19/2013   PLT 209 07/19/2013   GLUCOSE 134* 07/19/2013   CHOL 182 01/18/2013   TRIG 231.0* 01/18/2013   HDL 38.40* 01/18/2013   LDLDIRECT 118.0 01/18/2013   LDLCALC 89 10/31/2011   ALT 24 01/29/2013   AST 21 01/29/2013   NA 139 07/19/2013   K 4.0 07/19/2013   CL 101 07/19/2013  CREATININE 0.86 07/19/2013   BUN 21 07/19/2013   CO2 24 07/19/2013   TSH 1.34 01/29/2013   INR 1.01 07/19/2013   HGBA1C 6.4 05/16/2013   MICROALBUR 0.6 05/16/2013       Assessment & Plan:

## 2013-11-24 ENCOUNTER — Encounter: Payer: Self-pay | Admitting: Internal Medicine

## 2013-11-24 NOTE — Assessment & Plan Note (Signed)
Her BP is well controlled Her lytes and renal function are stable 

## 2013-11-24 NOTE — Assessment & Plan Note (Signed)
She has not been very compliant recently and therefore her blood sugars are not well controlled She agrees to be more complaint with her meds and lifestyle modifications

## 2013-11-24 NOTE — Assessment & Plan Note (Signed)
This is not well controlled She will be more complaint with her diet and exercise regimen

## 2013-11-24 NOTE — Assessment & Plan Note (Signed)
She has achieved her LDL goal 

## 2013-11-25 ENCOUNTER — Telehealth: Payer: Self-pay | Admitting: Internal Medicine

## 2013-11-25 NOTE — Telephone Encounter (Signed)
emmi emailed °

## 2014-01-02 ENCOUNTER — Encounter (HOSPITAL_COMMUNITY): Payer: Self-pay | Admitting: Interventional Cardiology

## 2014-01-13 ENCOUNTER — Other Ambulatory Visit: Payer: Self-pay | Admitting: Obstetrics & Gynecology

## 2014-01-13 LAB — HM PAP SMEAR

## 2014-01-14 LAB — CYTOLOGY - PAP

## 2014-02-21 ENCOUNTER — Telehealth: Payer: Self-pay | Admitting: *Deleted

## 2014-02-21 MED ORDER — LOSARTAN POTASSIUM 100 MG PO TABS: 100.0000 mg | ORAL_TABLET | Freq: Every day | ORAL | Status: DC

## 2014-02-21 NOTE — Telephone Encounter (Signed)
Refill done.  

## 2014-03-25 NOTE — Progress Notes (Signed)
Pt not seen, visit opened in error, no charge

## 2014-03-26 ENCOUNTER — Telehealth: Payer: Self-pay | Admitting: *Deleted

## 2014-03-26 MED ORDER — ALLOPURINOL 100 MG PO TABS: 100.0000 mg | ORAL_TABLET | Freq: Every day | ORAL | Status: DC

## 2014-03-26 NOTE — Telephone Encounter (Signed)
Refill done. Pt does need to be seen for future refills.

## 2014-05-05 ENCOUNTER — Other Ambulatory Visit: Payer: Self-pay

## 2014-05-05 MED ORDER — METOPROLOL SUCCINATE ER 50 MG PO TB24
50.0000 mg | ORAL_TABLET | Freq: Every day | ORAL | Status: DC
Start: 2014-05-05 — End: 2015-01-16

## 2014-06-10 ENCOUNTER — Encounter: Payer: Self-pay | Admitting: Internal Medicine

## 2014-06-13 ENCOUNTER — Other Ambulatory Visit: Payer: Self-pay

## 2014-06-13 MED ORDER — LOSARTAN POTASSIUM 100 MG PO TABS: 100.0000 mg | ORAL_TABLET | Freq: Every day | ORAL | Status: DC

## 2014-06-19 ENCOUNTER — Encounter: Payer: Self-pay | Admitting: Internal Medicine

## 2014-06-30 ENCOUNTER — Encounter: Payer: Self-pay | Admitting: Internal Medicine

## 2014-06-30 ENCOUNTER — Other Ambulatory Visit (INDEPENDENT_AMBULATORY_CARE_PROVIDER_SITE_OTHER): Payer: Medicare Other

## 2014-06-30 ENCOUNTER — Ambulatory Visit (INDEPENDENT_AMBULATORY_CARE_PROVIDER_SITE_OTHER): Payer: Medicare Other | Admitting: Internal Medicine

## 2014-06-30 VITALS — BP 138/84 | HR 85 | Temp 98.1°F | Ht 68.0 in | Wt 194.0 lb

## 2014-06-30 DIAGNOSIS — E785 Hyperlipidemia, unspecified: Secondary | ICD-10-CM | POA: Diagnosis not present

## 2014-06-30 DIAGNOSIS — Z0000 Encounter for general adult medical examination without abnormal findings: Secondary | ICD-10-CM

## 2014-06-30 DIAGNOSIS — E781 Pure hyperglyceridemia: Secondary | ICD-10-CM

## 2014-06-30 DIAGNOSIS — E118 Type 2 diabetes mellitus with unspecified complications: Secondary | ICD-10-CM

## 2014-06-30 DIAGNOSIS — M1000 Idiopathic gout, unspecified site: Secondary | ICD-10-CM

## 2014-06-30 DIAGNOSIS — I10 Essential (primary) hypertension: Secondary | ICD-10-CM

## 2014-06-30 LAB — URINALYSIS, ROUTINE W REFLEX MICROSCOPIC
Bilirubin Urine: NEGATIVE
Hgb urine dipstick: NEGATIVE
Ketones, ur: NEGATIVE
Leukocytes, UA: NEGATIVE
Nitrite: NEGATIVE
RBC / HPF: NONE SEEN (ref 0–?)
Specific Gravity, Urine: 1.025 (ref 1.000–1.030)
Total Protein, Urine: NEGATIVE
Urine Glucose: NEGATIVE
Urobilinogen, UA: 0.2 (ref 0.0–1.0)
pH: 6 (ref 5.0–8.0)

## 2014-06-30 LAB — BASIC METABOLIC PANEL
BUN: 19 mg/dL (ref 6–23)
CO2: 27 mEq/L (ref 19–32)
Calcium: 9.8 mg/dL (ref 8.4–10.5)
Chloride: 104 mEq/L (ref 96–112)
Creatinine, Ser: 0.85 mg/dL (ref 0.40–1.20)
GFR: 69.95 mL/min (ref >60.00)
Glucose, Bld: 139 mg/dL — ABNORMAL HIGH (ref 70–99)
Potassium: 4.3 mEq/L (ref 3.5–5.1)
Sodium: 137 mEq/L (ref 135–145)

## 2014-06-30 LAB — MICROALBUMIN / CREATININE URINE RATIO
Creatinine,U: 133.5 mg/dL
Microalb Creat Ratio: 1.8 mg/g (ref 0.0–30.0)
Microalb, Ur: 2.4 mg/dL — ABNORMAL HIGH (ref 0.0–1.9)

## 2014-06-30 LAB — LIPID PANEL
Cholesterol: 163 mg/dL (ref 0–200)
HDL: 43.2 mg/dL (ref >39.00)
NonHDL: 119.8
Total CHOL/HDL Ratio: 4
Triglycerides: 206 mg/dL — ABNORMAL HIGH (ref 0.0–149.0)
VLDL: 41.2 mg/dL — ABNORMAL HIGH (ref 0.0–40.0)

## 2014-06-30 LAB — LDL CHOLESTEROL, DIRECT: Direct LDL: 84 mg/dL

## 2014-06-30 LAB — TSH: TSH: 1.14 u[IU]/mL (ref 0.35–4.50)

## 2014-06-30 LAB — HEMOGLOBIN A1C: Hgb A1c MFr Bld: 7.1 % — ABNORMAL HIGH (ref 4.6–6.5)

## 2014-06-30 MED ORDER — ALLOPURINOL 100 MG PO TABS: 100.0000 mg | ORAL_TABLET | Freq: Every day | ORAL | Status: DC

## 2014-06-30 NOTE — Progress Notes (Signed)
Subjective:  Patient ID: Nicole [name], female    DOB: Jun 19, 1940  Age: 74 y.o. MRN: [id number]  CC: Diabetes; Hypertension; Hyperlipidemia; and Annual Exam   HPI Nicole [name] presents for a CPX and f/up on DM2. HTN, and hypercholesterolemia. She feels well and offers no complaints.  Outpatient Prescriptions Prior to Visit  Medication Sig Dispense Refill  . aspirin 81 MG chewable tablet Chew 1 tablet (81 mg total) by mouth daily. 30 tablet 0  . Cyanocobalamin (VITAMIN B-12 CR PO) Take 1 tablet by mouth daily.    [name] FREESTYLE LITE test strip TEST BLOOD GLUCOSE ONCE DAILY AS DIRECTED 100 each 6  . losartan (COZAAR) 100 MG tablet Take 1 tablet (100 mg total) by mouth daily. 90 tablet 3  . metoprolol succinate (TOPROL-XL) 50 MG 24 hr tablet Take 1 tablet (50 mg total) by mouth daily. Take with or immediately following a meal. 90 tablet 3  . OVER THE COUNTER MEDICATION Take 1 capsule by mouth daily. Tumeric capsule    . sitaGLIPtin-metformin (JANUMET) 50-500 MG per tablet Take 1 tablet by mouth 2 (two) times daily with a meal.    . allopurinol (ZYLOPRIM) 100 MG tablet Take 1 tablet (100 mg total) by mouth daily. 90 tablet 0  . chlorpheniramine-HYDROcodone (TUSSIONEX PENNKINETIC ER) 10-8 MG/5ML LQCR Take 5 mLs by mouth every 12 (twelve) hours as needed for cough. 115 mL 0   No facility-administered medications prior to visit.    ROS Review of Systems  Constitutional: Negative.  Negative for fever, chills, diaphoresis, appetite change and fatigue.  HENT: Negative.   Eyes: Negative.   Respiratory: Negative.  Negative for cough, choking, chest tightness, shortness of breath and stridor.   Cardiovascular: Negative.  Negative for chest pain, palpitations and leg swelling.  Gastrointestinal: Negative.  Negative for nausea, vomiting, abdominal pain, diarrhea, constipation and blood in stool.  Endocrine: Negative.  Negative for polydipsia, polyphagia and polyuria.  Genitourinary: Negative.   Negative for difficulty urinating.  Musculoskeletal: Positive for arthralgias. Negative for myalgias, back pain, joint swelling, neck pain and neck stiffness.  Skin: Negative.   Allergic/Immunologic: Negative.   Neurological: Negative.  Negative for dizziness, syncope, speech difficulty, weakness, light-headedness, numbness and headaches.  Hematological: Negative.  Negative for adenopathy. Does not bruise/bleed easily.  Psychiatric/Behavioral: Negative.     Objective:  BP 138/84 mmHg  Pulse 85  Temp(Src) 98.1 F (36.7 C) (Oral)  Ht '5\' 8"'$  (1.727 m)  Wt 194 lb (87.998 kg)  BMI 29.50 kg/m2  SpO2 97%  BP Readings from Last 3 Encounters:  06/30/14 138/84  11/22/13 140/84  08/05/13 130/76    Wt Readings from Last 3 Encounters:  06/30/14 194 lb (87.998 kg)  11/22/13 197 lb (89.359 kg)  08/05/13 194 lb 12.8 oz (88.361 kg)    Physical Exam  Constitutional: She is oriented to person, place, and time. She appears well-developed and well-nourished. No distress.  HENT:  Head: Normocephalic and atraumatic.  Mouth/Throat: Oropharynx is clear and moist. No oropharyngeal exudate.  Eyes: Conjunctivae are normal. Right eye exhibits no discharge. Left eye exhibits no discharge. No scleral icterus.  Neck: Normal range of motion. Neck supple. No JVD present. No tracheal deviation present. No thyromegaly present.  Cardiovascular: Normal rate, regular rhythm, normal heart sounds and intact distal pulses.  Exam reveals no gallop and no friction rub.   No murmur heard. Pulmonary/Chest: Effort normal and breath sounds normal. No stridor. No respiratory distress. She has no wheezes. She has no  rales. She exhibits no tenderness.  Abdominal: Soft. Bowel sounds are normal. She exhibits no distension and no mass. There is no tenderness. There is no rebound and no guarding.  Musculoskeletal: Normal range of motion. She exhibits no edema or tenderness.  Lymphadenopathy:    She has no cervical adenopathy.    Neurological: She is oriented to person, place, and time.  Skin: Skin is warm and dry. No rash noted. She is not diaphoretic. No erythema. No pallor.  Psychiatric: She has a normal mood and affect. Her behavior is normal. Judgment and thought content normal.  Vitals reviewed.   Lab Results  Component Value Date   WBC 6.3 11/22/2013   HGB 14.3 11/22/2013   HCT 43.0 11/22/2013   PLT 229.0 11/22/2013   GLUCOSE 139* 06/30/2014   CHOL 163 06/30/2014   TRIG 206.0* 06/30/2014   HDL 43.20 06/30/2014   LDLDIRECT 84.0 06/30/2014   LDLCALC 89 10/31/2011   ALT 24 01/29/2013   AST 21 01/29/2013   NA 137 06/30/2014   K 4.3 06/30/2014   CL 104 06/30/2014   CREATININE 0.85 06/30/2014   BUN 19 06/30/2014   CO2 27 06/30/2014   TSH 1.14 06/30/2014   INR 1.01 07/19/2013   HGBA1C 7.1* 06/30/2014   MICROALBUR 2.4* 06/30/2014    Dg Chest 2 View  07/17/2013   CLINICAL DATA:  Chest pain, history hypertension, diabetes, uterine cancer  EXAM: CHEST  2 VIEW  COMPARISON:  03/19/2012  FINDINGS: Upper normal heart size.  Calcified tortuous aorta.  Mediastinal contours and pulmonary vascularity normal.  Lungs clear.  No pleural effusion or pneumothorax.  Scattered endplate spur formation thoracic spine pre  IMPRESSION: No acute abnormalities.   Electronically Signed   By: Lavonia Dana M.D.   On: 07/17/2013 13:50   Nm Myocar Multi W/spect W/wall Motion / Ef  07/18/2013   CLINICAL DATA:  Chest pain, diabetes, hypertension, hyperlipidemia, and dyspnea on exertion.  EXAM: MYOCARDIAL IMAGING WITH SPECT (REST AND PHARMACOLOGIC-STRESS)  GATED LEFT VENTRICULAR WALL MOTION STUDY  LEFT VENTRICULAR EJECTION FRACTION  TECHNIQUE: Standard myocardial SPECT imaging was performed after resting intravenous injection of 10 mCi Tc-55msestamibi. Subsequently, intravenous infusion of Lexiscan was performed under the supervision of the Cardiology staff. At peak effect of the drug, 30 mCi Tc-916mestamibi was injected intravenously  and standard myocardial SPECT imaging was performed. Quantitative gated imaging was also performed to evaluate left ventricular wall motion, and estimate left ventricular ejection fraction.  COMPARISON:  Two-view chest x-ray 07/17/2013.  FINDINGS: Cardiac uptake is somewhat attenuated along the diaphragm. Uptake is otherwise normal. Contractility is within normal limits.  The stress images demonstrate slight decreased uptake within the anterior apical segments suggesting reversible ischemia.  Contractility and wall motion is normal.  The gated images demonstrate an estimated diastolic volume of 68 mL an estimated systolic volume 15 mL. The calculated ejection fraction is 78%.  IMPRESSION: 1. Mild inducible ischemia within the anterior apical segments. 2. Normal contractility and wall motion. 3. Normal function with an estimated ejection fraction of 78%.   Electronically Signed   By: ChLawrence Santiago.D.   On: 07/18/2013 13:47    Assessment & Plan:   AmLocklynas seen today for diabetes, hypertension, hyperlipidemia and annual exam.  Diagnoses and all orders for this visit:  Type II diabetes mellitus with manifestations - her blood sugars are adequately well controlled, will cont janumet Orders: -     Lipid panel; Future -     Basic  metabolic panel; Future -     Microalbumin / creatinine urine ratio; Future -     Urinalysis, Routine w reflex microscopic (not at [facility]); Future -     Hemoglobin A1c; Future  Essential hypertension - her BP is well controlled, will monitor her lytes and renal function Orders: -     Basic metabolic panel; Future -     Urinalysis, Routine w reflex microscopic (not at [facility]); Future  Hyperlipidemia with target LDL less than 100 - he has achieved her LDL goal Orders: -     Lipid panel; Future -     TSH; Future  HYPERTRIGLYCERIDEMIA - improvement noted Orders: -     Lipid panel; Future  Idiopathic gout, unspecified chronicity, unspecified site Orders: -      allopurinol (ZYLOPRIM) 100 MG tablet; Take 1 tablet (100 mg total) by mouth daily.  I have discontinued Ms. Kegel's chlorpheniramine-HYDROcodone. I am also having her maintain her OVER THE COUNTER MEDICATION, sitaGLIPtin-metformin, Cyanocobalamin (VITAMIN B-12 CR PO), aspirin, FREESTYLE LITE, metoprolol succinate, losartan, and allopurinol.  Meds ordered this encounter  Medications  . allopurinol (ZYLOPRIM) 100 MG tablet    Sig: Take 1 tablet (100 mg total) by mouth daily.    Dispense:  90 tablet    Refill:  3   See AVS for instructions about healthy living and anticipatory guidance.  Follow-up: Return in about 4 months (around 10/30/2014).  Scarlette Calico, MD

## 2014-06-30 NOTE — Assessment & Plan Note (Signed)

## 2014-06-30 NOTE — Patient Instructions (Signed)

## 2014-06-30 NOTE — Progress Notes (Signed)
Pre visit review using our clinic review tool, if applicable. No additional management support is needed unless otherwise documented below in the visit note. 

## 2014-07-11 LAB — HM DIABETES EYE EXAM

## 2014-07-14 ENCOUNTER — Encounter: Payer: Self-pay | Admitting: Internal Medicine

## 2014-08-27 ENCOUNTER — Ambulatory Visit (AMBULATORY_SURGERY_CENTER): Payer: Self-pay

## 2014-08-27 VITALS — Ht 68.0 in | Wt 197.0 lb

## 2014-08-27 DIAGNOSIS — Z86010 Personal history of colonic polyps: Secondary | ICD-10-CM

## 2014-08-27 NOTE — Progress Notes (Signed)
Not on home 02 No egg or soy allergies No hx of anesthesia problems

## 2014-09-09 ENCOUNTER — Encounter: Payer: Self-pay | Admitting: Internal Medicine

## 2014-09-09 ENCOUNTER — Ambulatory Visit (AMBULATORY_SURGERY_CENTER): Payer: Medicare Other | Admitting: Internal Medicine

## 2014-09-09 VITALS — BP 125/73 | HR 68 | Temp 96.6°F | Resp 18 | Ht 68.0 in | Wt 197.0 lb

## 2014-09-09 DIAGNOSIS — K648 Other hemorrhoids: Secondary | ICD-10-CM

## 2014-09-09 DIAGNOSIS — Z86010 Personal history of colonic polyps: Secondary | ICD-10-CM

## 2014-09-09 LAB — GLUCOSE, CAPILLARY
Glucose-Capillary: 151 mg/dL — ABNORMAL HIGH (ref 65–99)
Glucose-Capillary: 136 mg/dL — ABNORMAL HIGH (ref 65–99)

## 2014-09-09 MED ORDER — HYDROCORTISONE ACE-PRAMOXINE 1-1 % RE CREA: 1.0000 "application " | TOPICAL_CREAM | Freq: Three times a day (TID) | RECTAL | Status: DC

## 2014-09-09 MED ORDER — SODIUM CHLORIDE 0.9 % IV SOLN: 500.0000 mL | INTRAVENOUS | Status: DC

## 2014-09-09 NOTE — Patient Instructions (Addendum)
YOU HAD AN ENDOSCOPIC PROCEDURE TODAY AT [facility]:   Refer to the procedure report that was given to you for any specific questions about what was found during the examination.  If the procedure report does not answer your questions, please call your gastroenterologist to clarify.  If you requested that your care partner not be given the details of your procedure findings, then the procedure report has been included in a sealed envelope for you to review at your convenience later.  YOU SHOULD EXPECT: Some feelings of bloating in the abdomen. Passage of more gas than usual.  Walking can help get rid of the air that was put into your GI tract during the procedure and reduce the bloating. If you had a lower endoscopy (such as a colonoscopy or flexible sigmoidoscopy) you may notice spotting of blood in your stool or on the toilet paper. If you underwent a bowel prep for your procedure, you may not have a normal bowel movement for a few days.  Please Note:  You might notice some irritation and congestion in your nose or some drainage.  This is from the oxygen used during your procedure.  There is no need for concern and it should clear up in a day or so.  SYMPTOMS TO REPORT IMMEDIATELY:   Following lower endoscopy (colonoscopy or flexible sigmoidoscopy):  Excessive amounts of blood in the stool  Significant tenderness or worsening of abdominal pains  Swelling of the abdomen that is new, acute  Fever of 100F or higher  For urgent or emergent issues, a gastroenterologist can be reached at any hour by calling [phone].   DIET: Your first meal following the procedure should be a small meal and then it is ok to progress to your normal diet. Heavy or fried foods are harder to digest and may make you feel nauseous or bloated.  Likewise, meals heavy in dairy and vegetables can increase bloating.  Drink plenty of fluids but you should avoid alcoholic beverages for 24  hours.  ACTIVITY:  You should plan to take it easy for the rest of today and you should NOT DRIVE or use heavy machinery until tomorrow (because of the sedation medicines used during the test).    FOLLOW UP: Our staff will call the number listed on your records the next business day following your procedure to check on you and address any questions or concerns that you may have regarding the information given to you following your procedure. If we do not reach you, we will leave a message.  However, if you are feeling well and you are not experiencing any problems, there is no need to return our call.  We will assume that you have returned to your regular daily activities without incident.  If any biopsies were taken you will be contacted by phone or by letter within the next 1-3 weeks.  Please call us at [phone] if you have not heard about the biopsies in 3 weeks.    SIGNATURES/CONFIDENTIALITY: You and/or your care partner have signed paperwork which will be entered into your electronic medical record.  These signatures attest to the fact that that the information above on your After Visit Summary has been reviewed and is understood.  Full responsibility of the confidentiality of this discharge information lies with you and/or your care-partner.   Handouts were given to your care partner on hemorrhoids, diverticulosis and a high fiber diet with liberal fluid intake Call if hemorrhoids are symptomatic  and interested in surgical consult. Rx was sent to pharmacy for analpram ointment for hemorrhoids. You may resume your current medications today. Your blood sugar was 136 in the recovery room. Please call if any questions or concerns.

## 2014-09-09 NOTE — Progress Notes (Signed)
No problems noted in the recovery room. maw 

## 2014-09-09 NOTE — Op Note (Signed)
[place]  [address]. [place], [id number]   COLONOSCOPY PROCEDURE REPORT  PATIENT: Nicole [name], Nicole [name]  MR#: [id number] BIRTHDATE: Oct 02, 1940 , 73  yrs. old GENDER: female ENDOSCOPIST: Jerene Bears, MD REFERRED LH:TDSKA R Patterson, M.D. PROCEDURE DATE:  09/09/2014 PROCEDURE:   Colonoscopy, surveillance First Screening Colonoscopy - Avg.  risk and is 50 yrs.  old or older - No.  Prior Negative Screening - Now for repeat screening. N/A  History of Adenoma - Now for follow-up colonoscopy & has been > or = to 3 yrs.  Yes hx of adenoma.  Has been 3 or more years since last colonoscopy.  Polyps removed today? No Recommend repeat exam, <10 yrs? 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 MEDICATIONS: Monitored anesthesia care and Propofol 180 mg IV  DESCRIPTION OF PROCEDURE:   After the risks benefits and alternatives of the procedure were thoroughly explained, informed consent was obtained.  The digital rectal exam revealed several skin tags and large external hemorrhoids.   The [id number] endoscope was introduced through the anus and advanced to the cecum, which was identified by both the appendix and ileocecal valve. No adverse events experienced.   The quality of the prep was good.  (Suprep was used)  The instrument was then slowly withdrawn as the colon was fully examined. Estimated blood loss is zero unless otherwise noted in this procedure report.  COLON FINDINGS: There was mild diverticulosis noted in the ascending colon, descending colon, and sigmoid colon.   Melanosis coli was found throughout the entire examined colon.   The examination was otherwise normal.  Retroflexed views revealed small internal hemorrhoids and large external hemorrhoids. The time to cecum = 2.1 Withdrawal time = 10.8   The scope was withdrawn and the procedure completed. COMPLICATIONS: There were  no immediate complications.  ENDOSCOPIC IMPRESSION: 1.   Mild diverticulosis was noted in the ascending colon, descending colon, and sigmoid colon 2.   Melanosis coli was found throughout the entire examined colon 3.   The examination was otherwise normal  RECOMMENDATIONS: 1.  High fiber diet 2.  Given your age, you will likely not need another colonoscopy for colon cancer screening or polyp surveillance.  These types of tests usually stop around the age 73.  I will leave  the decision to you and your primary care provider in 10 years based on your overall health at that time. 3.  Surgical referral for evaluation of large external and smaller internal hemorrhoids  eSigned:  Jerene Bears, MD 09/09/2014 9:56 AM   cc: [name], MD and The Patient

## 2014-09-09 NOTE — Progress Notes (Signed)
A/ox3 pleased with MAC, report to Annette RN 

## 2014-09-10 ENCOUNTER — Telehealth: Payer: Self-pay | Admitting: *Deleted

## 2014-09-10 LAB — HM COLONOSCOPY

## 2014-09-10 NOTE — Telephone Encounter (Signed)
Name identifier, left message, follow-up 

## 2014-09-11 ENCOUNTER — Telehealth: Payer: Self-pay

## 2014-09-11 NOTE — Telephone Encounter (Signed)
[facility] appt scheduled with Dr. Johney Maine for hemorrhoids 10/03/14'@9am'$ . Called pt with appt and she does not want to follow through with this at this time. Appt cancelled with [facility] and Dr. Hilarie Fredrickson notified.

## 2014-10-29 ENCOUNTER — Ambulatory Visit: Payer: Medicare Other | Admitting: Family Medicine

## 2014-10-31 ENCOUNTER — Encounter: Payer: Self-pay | Admitting: Family Medicine

## 2014-10-31 ENCOUNTER — Ambulatory Visit (INDEPENDENT_AMBULATORY_CARE_PROVIDER_SITE_OTHER): Payer: Medicare Other | Admitting: Family Medicine

## 2014-10-31 ENCOUNTER — Telehealth: Payer: Self-pay | Admitting: Internal Medicine

## 2014-10-31 VITALS — BP 132/84 | HR 91 | Ht 68.0 in | Wt 197.0 lb

## 2014-10-31 DIAGNOSIS — S7002XA Contusion of left hip, initial encounter: Secondary | ICD-10-CM

## 2014-10-31 DIAGNOSIS — S7000XA Contusion of unspecified hip, initial encounter: Secondary | ICD-10-CM | POA: Insufficient documentation

## 2014-10-31 NOTE — Assessment & Plan Note (Signed)
Patient has only a hip contusion noted. With patient's history of osteopenia was a good idea for patient to be seen. I do not feel x-rays are needed at this time with patient having no groin pain and able to ambulate. Patient seems to be healing do not see any signs of bone abnormality noted on ultrasound. Patient will follow-up in 2 weeks if pain is not completely resolved.

## 2014-10-31 NOTE — Progress Notes (Signed)
[name] [address] [place], [place] [address] Phone: [phone] Subjective:     CC: Left hip pain  [id number] Nicole [name] is a 74 y.o. female coming in with complaint of left hip pain. Patient 5 days ago unfortunate slipped on a wet floor and fell on her left side of her hip. Had significant pain immediately and was unable to stand for definitive first 15 minutes. Had significant swelling and bruising. Patient states over the course of time and seems to be improving slowly the patient was to make sure that nothing is significantly wrong. Denies any radiation down the leg any numbness. States that that bruising is improving. Still very tender to palpation in the area as well as when she lays on that side. Does wake her up at night if she rolls on that side. Denies any groin pain with it. Able to walk with no pain except going up and down stairs.  Past Medical History  Diagnosis Date  . Hypertension   . Low back pain   . Osteoarthritis   . Diabetes mellitus     type 2  . Bronchitis     hx of  . Cancer [facility]) 2004    uterine/cervical   Past Surgical History  Procedure Laterality Date  . Abdominal hysterectomy  2004  . Tubal ligation    . Appendectomy  when 74 years old  . Incontinence surgery    . Knee arthroscopy Left   . 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 Left heart catheterization with coronary angiogram N/A 07/19/2013    Procedure: LEFT HEART CATHETERIZATION WITH CORONARY ANGIOGRAM;  Surgeon: Sinclair Grooms, MD;  Location: [facility] CATH LAB;  Service: Cardiovascular;  Laterality: N/A;  . Colonoscopy  08-05-09    [name]  . Polypectomy  08-05-09    2 polyps   Social History  Substance Use Topics  . Smoking status: Never Smoker   . Smokeless tobacco: Never Used  . Alcohol Use: No   No Known  Allergies Family History  Problem Relation Age of Onset  . Arthritis Other   . Cancer Other     colon, lst degree relative  . Diabetes Other     st degree relative  . Hyperlipidemia Other   . Hypertension Other   . Heart attack Father   . Heart disease Father   . Colon cancer Neg Hx         Past medical history, social, surgical and family history all reviewed in electronic medical record.   Review of Systems: No headache, visual changes, nausea, vomiting, diarrhea, constipation, dizziness, abdominal pain, skin rash, fevers, chills, night sweats, weight loss, swollen lymph nodes, body aches, joint swelling, muscle aches, chest pain, shortness of breath, mood changes.   Objective Blood pressure 132/84, pulse 91, height 5' 8"  (1.727 m), weight 197 lb (89.359 kg), SpO2 98 %.  General: No apparent distress alert and oriented x3 mood and affect normal, dressed appropriately.  HEENT: Pupils equal, extraocular movements intact  Respiratory: Patient's speak in full sentences and does not appear short of breath  Cardiovascular: No lower extremity edema, non tender, no erythema  Skin: Warm dry intact with no signs of infection or rash on extremities or on axial skeleton.  Abdomen: Soft nontender  Neuro: Cranial nerves II through XII are intact,  neurovascularly intact in all extremities with 2+ DTRs and 2+ pulses.  Lymph: No lymphadenopathy of posterior or anterior cervical chain or axillae bilaterally.  Gait normal with good balance and coordination.  MSK:  Non tender with full range of motion and good stability and symmetric strength and tone of shoulders, elbows, wrist, knee and ankles bilaterally. Arthritic changes of multiple joints.  Patient's left hip exam shows the patient does have a bruise on the lateral aspect of the hip. Seems to be resolving. No hematoma noted. Neurovascular intact distally. Full range of motion. It is tender to palpation over the bruising area. Negative fulcrum  test. Full strength of the hip.  Contralateral hip unremarkable  Limited musculoskeletal ultrasound was performed and interpreted by Hulan Saas, M  Limited ultrasound and hematoma only show soft tissue swelling. No hypoechoic changes around the bone and no cortical defect noted. Patient also has no hematoma still present. Impression: Soft tissue edema resolving   Impression and Recommendations:     This case required medical decision making of moderate complexity.

## 2014-10-31 NOTE — Progress Notes (Signed)
Pre visit review using our clinic review tool, if applicable. No additional management support is needed unless otherwise documented below in the visit note. 

## 2014-10-31 NOTE — Patient Instructions (Signed)
Good to se eyou Continue with what you are doing and one of the sweetest people I have met pennsaid pinkie amount topically 2 times daily as needed.  Arnica lotion 2 times daily can help Will take 2 weeks to heal See me again when you need me

## 2014-10-31 NOTE — Telephone Encounter (Signed)
Patient is requesting samples of janumet

## 2014-10-31 NOTE — Telephone Encounter (Signed)
No samples available pls inform pt...Nicole [name]

## 2014-11-03 NOTE — Telephone Encounter (Signed)
Dr. Ronnald Ramp, do you have any samples?

## 2014-11-03 NOTE — Telephone Encounter (Signed)
Yes, they are in the cabinet for her

## 2014-11-04 NOTE — Telephone Encounter (Signed)
Notified patient.

## 2015-01-09 ENCOUNTER — Other Ambulatory Visit: Payer: Self-pay | Admitting: Internal Medicine

## 2015-01-12 ENCOUNTER — Telehealth: Payer: Self-pay | Admitting: Internal Medicine

## 2015-01-12 NOTE — Telephone Encounter (Signed)
Has scheduled three month fu for Friday.  Would like to know if she needs to come in earlier for fast labs to check diabetes.

## 2015-01-12 NOTE — Telephone Encounter (Signed)
No labs before appt.  

## 2015-01-13 NOTE — Telephone Encounter (Signed)
Notified patient.

## 2015-01-16 ENCOUNTER — Encounter: Payer: Self-pay | Admitting: Internal Medicine

## 2015-01-16 ENCOUNTER — Other Ambulatory Visit (INDEPENDENT_AMBULATORY_CARE_PROVIDER_SITE_OTHER): Payer: Medicare Other

## 2015-01-16 ENCOUNTER — Ambulatory Visit (INDEPENDENT_AMBULATORY_CARE_PROVIDER_SITE_OTHER): Payer: Medicare Other | Admitting: Internal Medicine

## 2015-01-16 VITALS — BP 160/88 | HR 78 | Temp 97.7°F | Resp 16 | Ht 68.0 in | Wt 199.0 lb

## 2015-01-16 DIAGNOSIS — Z23 Encounter for immunization: Secondary | ICD-10-CM

## 2015-01-16 DIAGNOSIS — E118 Type 2 diabetes mellitus with unspecified complications: Secondary | ICD-10-CM | POA: Diagnosis not present

## 2015-01-16 DIAGNOSIS — I10 Essential (primary) hypertension: Secondary | ICD-10-CM

## 2015-01-16 LAB — HEMOGLOBIN A1C: Hgb A1c MFr Bld: 7.9 % — ABNORMAL HIGH (ref 4.6–6.5)

## 2015-01-16 LAB — BASIC METABOLIC PANEL
BUN: 23 mg/dL (ref 6–23)
CO2: 28 mEq/L (ref 19–32)
Calcium: 10 mg/dL (ref 8.4–10.5)
Chloride: 104 mEq/L (ref 96–112)
Creatinine, Ser: 0.95 mg/dL (ref 0.40–1.20)
GFR: 61.43 mL/min (ref >60.00)
Glucose, Bld: 161 mg/dL — ABNORMAL HIGH (ref 70–99)
Potassium: 4.2 mEq/L (ref 3.5–5.1)
Sodium: 139 mEq/L (ref 135–145)

## 2015-01-16 MED ORDER — NEBIVOLOL HCL 10 MG PO TABS: 10.0000 mg | ORAL_TABLET | Freq: Every day | ORAL | Status: DC

## 2015-01-16 NOTE — Patient Instructions (Signed)
Hypertension Hypertension, commonly called high blood pressure, is when the force of blood pumping through your arteries is too strong. Your arteries are the blood vessels that carry blood from your heart throughout your body. A blood pressure reading consists of a higher number over a lower number, such as 110/72. The higher number (systolic) is the pressure inside your arteries when your heart pumps. The lower number (diastolic) is the pressure inside your arteries when your heart relaxes. Ideally you want your blood pressure below 120/80. Hypertension forces your heart to work harder to pump blood. Your arteries may become narrow or stiff. Having untreated or uncontrolled hypertension can cause heart attack, stroke, kidney disease, and other problems. RISK FACTORS Some risk factors for high blood pressure are controllable. Others are not.  Risk factors you cannot control include:   Race. You may be at higher risk if you are African American.  Age. Risk increases with age.  Gender. Men are at higher risk than women before age 45 years. After age 65, women are at higher risk than men. Risk factors you can control include:  Not getting enough exercise or physical activity.  Being overweight.  Getting too much fat, sugar, calories, or salt in your diet.  Drinking too much alcohol. SIGNS AND SYMPTOMS Hypertension does not usually cause signs or symptoms. Extremely high blood pressure (hypertensive crisis) may cause headache, anxiety, shortness of breath, and nosebleed. DIAGNOSIS To check if you have hypertension, your health care provider will measure your blood pressure while you are seated, with your arm held at the level of your heart. It should be measured at least twice using the same arm. Certain conditions can cause a difference in blood pressure between your right and left arms. A blood pressure reading that is higher than normal on one occasion does not mean that you need treatment. If  it is not clear whether you have high blood pressure, you may be asked to return on a different day to have your blood pressure checked again. Or, you may be asked to monitor your blood pressure at home for 1 or more weeks. TREATMENT Treating high blood pressure includes making lifestyle changes and possibly taking medicine. Living a healthy lifestyle can help lower high blood pressure. You may need to change some of your habits. Lifestyle changes may include:  Following the DASH diet. This diet is high in fruits, vegetables, and whole grains. It is low in salt, red meat, and added sugars.  Keep your sodium intake below 2,300 mg per day.  Getting at least 30-45 minutes of aerobic exercise at least 4 times per week.  Losing weight if necessary.  Not smoking.  Limiting alcoholic beverages.  Learning ways to reduce stress. Your health care provider may prescribe medicine if lifestyle changes are not enough to get your blood pressure under control, and if one of the following is true:  You are 18-59 years of age and your systolic blood pressure is above 140.  You are 60 years of age or older, and your systolic blood pressure is above 150.  Your diastolic blood pressure is above 90.  You have diabetes, and your systolic blood pressure is over 140 or your diastolic blood pressure is over 90.  You have kidney disease and your blood pressure is above 140/90.  You have heart disease and your blood pressure is above 140/90. Your personal target blood pressure may vary depending on your medical conditions, your age, and other factors. HOME CARE INSTRUCTIONS    Have your blood pressure rechecked as directed by your health care provider.   Take medicines only as directed by your health care provider. Follow the directions carefully. Blood pressure medicines must be taken as prescribed. The medicine does not work as well when you skip doses. Skipping doses also puts you at risk for  problems.  Do not smoke.   Monitor your blood pressure at home as directed by your health care provider. SEEK MEDICAL CARE IF:   You think you are having a reaction to medicines taken.  You have recurrent headaches or feel dizzy.  You have swelling in your ankles.  You have trouble with your vision. SEEK IMMEDIATE MEDICAL CARE IF:  You develop a severe headache or confusion.  You have unusual weakness, numbness, or feel faint.  You have severe chest or abdominal pain.  You vomit repeatedly.  You have trouble breathing. MAKE SURE YOU:   Understand these instructions.  Will watch your condition.  Will get help right away if you are not doing well or get worse.   This information is not intended to replace advice given to you by your health care provider. Make sure you discuss any questions you have with your health care provider.   Document Released: 01/10/2005 Document Revised: 05/27/2014 Document Reviewed: 11/02/2012 Elsevier Interactive Patient Education 2016 Elsevier Inc.  

## 2015-01-16 NOTE — Progress Notes (Signed)
Pre visit review using our clinic review tool, if applicable. No additional management support is needed unless otherwise documented below in the visit note. 

## 2015-01-16 NOTE — Progress Notes (Signed)
Subjective:  Patient ID: Nicole [name], female    DOB: 06-25-1940  Age: 74 y.o. MRN: [id number]  CC: Diabetes and Hypertension   HPI ALEKSANDRA RABEN presents for f/up - she feels well and offers no complaints.  Outpatient Prescriptions Prior to Visit  Medication Sig Dispense Refill  . allopurinol (ZYLOPRIM) 100 MG tablet Take 1 tablet (100 mg total) by mouth daily. 90 tablet 3  . CALCIUM-VITAMIN D PO Take by mouth.    . Cyanocobalamin (VITAMIN B-12 CR PO) Take 1 tablet by mouth daily.    [name] DHEA 25 MG CAPS Take 25 mg by mouth 2 (two) times daily.    [name] FREESTYLE LITE test strip TEST once daily as directed 100 each 11  . losartan (COZAAR) 100 MG tablet Take 1 tablet (100 mg total) by mouth daily. 90 tablet 3  . Multiple Vitamins-Minerals (MULTIVITAMIN WITH MINERALS) tablet Take 1 tablet by mouth daily.    . pramoxine-hydrocortisone (PROCTOCREAM-HC) 1-1 % rectal cream Place 1 application rectally 3 (three) times daily. 30 g 1  . sitaGLIPtin-metformin (JANUMET) 50-500 MG per tablet Take 1 tablet by mouth 2 (two) times daily with a meal.    . Turmeric 450 MG CAPS Take 450 mg by mouth 2 (two) times daily.    . metoprolol succinate (TOPROL-XL) 50 MG 24 hr tablet Take 1 tablet (50 mg total) by mouth daily. Take with or immediately following a meal. 90 tablet 3   No facility-administered medications prior to visit.    ROS Review of Systems  Constitutional: Negative.   HENT: Negative.   Eyes: Negative.   Respiratory: Negative.  Negative for cough, choking, chest tightness, shortness of breath and stridor.   Cardiovascular: Negative.  Negative for chest pain, palpitations and leg swelling.  Gastrointestinal: Negative.  Negative for nausea, abdominal pain, diarrhea, constipation and blood in stool.  Endocrine: Negative.  Negative for polydipsia, polyphagia and polyuria.  Genitourinary: Negative.   Musculoskeletal: Negative for myalgias, back pain and joint swelling.  Skin: Negative.   Negative for rash.  Allergic/Immunologic: Negative.   Neurological: Negative.   Hematological: Negative.  Negative for adenopathy. Does not bruise/bleed easily.  Psychiatric/Behavioral: Negative.     Objective:  BP 160/88 mmHg  Pulse 78  Temp(Src) 97.7 F (36.5 C) (Oral)  Resp 16  Ht '5\' 8"'$  (1.727 m)  Wt 199 lb (90.266 kg)  BMI 30.26 kg/m2  SpO2 99%  BP Readings from Last 3 Encounters:  01/16/15 160/88  10/31/14 132/84  09/09/14 125/73    Wt Readings from Last 3 Encounters:  01/16/15 199 lb (90.266 kg)  10/31/14 197 lb (89.359 kg)  09/09/14 197 lb (89.359 kg)    Physical Exam  Constitutional: She is oriented to person, place, and time. No distress.  HENT:  Head: Normocephalic and atraumatic.  Mouth/Throat: Oropharynx is clear and moist. No oropharyngeal exudate.  Eyes: Conjunctivae are normal. Right eye exhibits no discharge. Left eye exhibits no discharge. No scleral icterus.  Neck: Normal range of motion. Neck supple. No JVD present. No tracheal deviation present. No thyromegaly present.  Cardiovascular: Normal rate, regular rhythm, normal heart sounds and intact distal pulses.  Exam reveals no gallop and no friction rub.   No murmur heard. Pulmonary/Chest: Effort normal and breath sounds normal. No stridor. No respiratory distress. She has no wheezes. She has no rales. She exhibits no tenderness.  Abdominal: Soft. Bowel sounds are normal. She exhibits no distension and no mass. There is no tenderness. There is no  rebound and no guarding.  Musculoskeletal: Normal range of motion. She exhibits no edema or tenderness.  Lymphadenopathy:    She has no cervical adenopathy.  Neurological: She is oriented to person, place, and time.  Skin: Skin is warm and dry. No rash noted. She is not diaphoretic. No erythema. No pallor.  Vitals reviewed.   Lab Results  Component Value Date   WBC 6.3 11/22/2013   HGB 14.3 11/22/2013   HCT 43.0 11/22/2013   PLT 229.0 11/22/2013    GLUCOSE 161* 01/16/2015   CHOL 163 06/30/2014   TRIG 206.0* 06/30/2014   HDL 43.20 06/30/2014   LDLDIRECT 84.0 06/30/2014   LDLCALC 89 10/31/2011   ALT 24 01/29/2013   AST 21 01/29/2013   NA 139 01/16/2015   K 4.2 01/16/2015   CL 104 01/16/2015   CREATININE 0.95 01/16/2015   BUN 23 01/16/2015   CO2 28 01/16/2015   TSH 1.14 06/30/2014   INR 1.01 07/19/2013   HGBA1C 7.9* 01/16/2015   MICROALBUR 2.4* 06/30/2014    Dg Chest 2 View  07/17/2013  CLINICAL DATA:  Chest pain, history hypertension, diabetes, uterine cancer EXAM: CHEST  2 VIEW COMPARISON:  03/19/2012 FINDINGS: Upper normal heart size. Calcified tortuous aorta. Mediastinal contours and pulmonary vascularity normal. Lungs clear. No pleural effusion or pneumothorax. Scattered endplate spur formation thoracic spine pre IMPRESSION: No acute abnormalities. Electronically Signed   By: Lavonia Dana M.D.   On: 07/17/2013 13:50   Nm Myocar Multi W/spect W/wall Motion / Ef  07/18/2013  CLINICAL DATA:  Chest pain, diabetes, hypertension, hyperlipidemia, and dyspnea on exertion. EXAM: MYOCARDIAL IMAGING WITH SPECT (REST AND PHARMACOLOGIC-STRESS) GATED LEFT VENTRICULAR WALL MOTION STUDY LEFT VENTRICULAR EJECTION FRACTION TECHNIQUE: Standard myocardial SPECT imaging was performed after resting intravenous injection of 10 mCi [id number]. Subsequently, intravenous infusion of Lexiscan was performed under the supervision of the Cardiology staff. At peak effect of the drug, 30 mCi [id number] was injected intravenously and standard myocardial SPECT imaging was performed. Quantitative gated imaging was also performed to evaluate left ventricular wall motion, and estimate left ventricular ejection fraction. COMPARISON:  Two-view chest x-ray 07/17/2013. FINDINGS: Cardiac uptake is somewhat attenuated along the diaphragm. Uptake is otherwise normal. Contractility is within normal limits. The stress images demonstrate slight decreased uptake within  the anterior apical segments suggesting reversible ischemia. Contractility and wall motion is normal. The gated images demonstrate an estimated diastolic volume of 68 mL an estimated systolic volume 15 mL. The calculated ejection fraction is 78%. IMPRESSION: 1. Mild inducible ischemia within the anterior apical segments. 2. Normal contractility and wall motion. 3. Normal function with an estimated ejection fraction of 78%. Electronically Signed   By: ChLawrence Santiago.D.   On: 07/18/2013 13:47    Assessment & Plan:   AmAudrynaas seen today for diabetes and hypertension.  Diagnoses and all orders for this visit:  Essential hypertension- her BP is well controlled, lytes and renal function are stable -     Basic metabolic panel; Future -     nebivolol (BYSTOLIC) 10 MG tablet; Take 1 tablet (10 mg total) by mouth daily.  Type 2 diabetes mellitus with complication, without long-term current use of insulin (HCMedina her A1C has risen some, she is still relatively well controlled for her age, she does not want to increase or add meds to get better control, she will work on her lifestyle modifications -     Hemoglobin A1c; Future -     Basic metabolic panel;  Future  Encounter for immunization  Need for 23-polyvalent pneumococcal polysaccharide vaccine -     Pneumococcal polysaccharide vaccine 23-valent greater than or equal to 2yo subcutaneous/IM  Other orders -     Flu Vaccine QUAD 36+ mos IM  I have discontinued Ms. Flammer's metoprolol succinate. I am also having her start on nebivolol. Additionally, I am having her maintain her sitaGLIPtin-metformin, Cyanocobalamin (VITAMIN B-12 CR PO), losartan, allopurinol, Turmeric, DHEA, multivitamin with minerals, CALCIUM-VITAMIN D PO, pramoxine-hydrocortisone, and FREESTYLE LITE.  Meds ordered this encounter  Medications  . nebivolol (BYSTOLIC) 10 MG tablet    Sig: Take 1 tablet (10 mg total) by mouth daily.    Dispense:  119 tablet    Refill:  0      Follow-up: Return in about 4 months (around 05/17/2015).  Scarlette Calico, MD

## 2015-01-22 NOTE — Telephone Encounter (Signed)
Pt agrees to change in medication please advise.

## 2015-01-22 NOTE — Telephone Encounter (Signed)
Received a phone call from the patient. She does not have access to mychart at the moment. She states that she is willing to listen to any suggestions/ ideas that you have. Please advise.  She states that she would like a call this week because of lack of computer access. Her cell is the best #.

## 2015-01-26 ENCOUNTER — Other Ambulatory Visit: Payer: Self-pay | Admitting: Internal Medicine

## 2015-01-26 DIAGNOSIS — E118 Type 2 diabetes mellitus with unspecified complications: Secondary | ICD-10-CM

## 2015-01-26 MED ORDER — ALOGLIPTIN-METFORMIN HCL 12.5-1000 MG PO TABS: 1.0000 | ORAL_TABLET | Freq: Two times a day (BID) | ORAL | Status: DC

## 2015-01-27 ENCOUNTER — Telehealth: Payer: Self-pay | Admitting: Internal Medicine

## 2015-01-27 DIAGNOSIS — E118 Type 2 diabetes mellitus with unspecified complications: Secondary | ICD-10-CM

## 2015-01-27 NOTE — Telephone Encounter (Signed)
Pt called state that Alogliptin-Metformin HCl 12.05-998 MG TABS going to cost her $182, are there anything cheaper that Dr. Ronnald Ramp can send in for her?. Please help

## 2015-01-28 NOTE — Telephone Encounter (Signed)
Did the pharmacist offer her the generic form of this?

## 2015-01-28 NOTE — Telephone Encounter (Signed)
Please advise 

## 2015-01-28 NOTE — Telephone Encounter (Signed)
I spoke to the pharmacy directly and they stated that is the cost of the generic. Please advise

## 2015-01-31 MED ORDER — METFORMIN HCL 1000 MG PO TABS: 1000.0000 mg | ORAL_TABLET | Freq: Two times a day (BID) | ORAL | Status: DC

## 2015-01-31 NOTE — Telephone Encounter (Signed)
changed

## 2015-03-09 ENCOUNTER — Other Ambulatory Visit: Payer: Self-pay | Admitting: Obstetrics & Gynecology

## 2015-03-09 DIAGNOSIS — Z8542 Personal history of malignant neoplasm of other parts of uterus: Secondary | ICD-10-CM | POA: Diagnosis not present

## 2015-03-09 DIAGNOSIS — Z01419 Encounter for gynecological examination (general) (routine) without abnormal findings: Secondary | ICD-10-CM | POA: Diagnosis not present

## 2015-03-09 DIAGNOSIS — Z124 Encounter for screening for malignant neoplasm of cervix: Secondary | ICD-10-CM | POA: Diagnosis not present

## 2015-03-09 DIAGNOSIS — Z1231 Encounter for screening mammogram for malignant neoplasm of breast: Secondary | ICD-10-CM | POA: Diagnosis not present

## 2015-03-09 DIAGNOSIS — Z6830 Body mass index (BMI) 30.0-30.9, adult: Secondary | ICD-10-CM | POA: Diagnosis not present

## 2015-03-09 LAB — HM PAP SMEAR

## 2015-03-09 LAB — HM MAMMOGRAPHY

## 2015-03-10 LAB — CYTOLOGY - PAP

## 2015-03-10 NOTE — Addendum Note (Signed)
Addended by: [name] on: 03/10/2015 05:07 PM   Modules accepted: Miquel Dunn

## 2015-04-14 ENCOUNTER — Encounter: Payer: Self-pay | Admitting: Internal Medicine

## 2015-04-14 ENCOUNTER — Ambulatory Visit (INDEPENDENT_AMBULATORY_CARE_PROVIDER_SITE_OTHER): Payer: Medicare Other | Admitting: Internal Medicine

## 2015-04-14 ENCOUNTER — Other Ambulatory Visit (INDEPENDENT_AMBULATORY_CARE_PROVIDER_SITE_OTHER): Payer: Medicare Other

## 2015-04-14 VITALS — BP 134/80 | HR 83 | Temp 98.5°F | Resp 16 | Ht 68.0 in | Wt 191.0 lb

## 2015-04-14 DIAGNOSIS — M1000 Idiopathic gout, unspecified site: Secondary | ICD-10-CM

## 2015-04-14 DIAGNOSIS — E785 Hyperlipidemia, unspecified: Secondary | ICD-10-CM | POA: Diagnosis not present

## 2015-04-14 DIAGNOSIS — E781 Pure hyperglyceridemia: Secondary | ICD-10-CM

## 2015-04-14 DIAGNOSIS — E118 Type 2 diabetes mellitus with unspecified complications: Secondary | ICD-10-CM | POA: Diagnosis not present

## 2015-04-14 DIAGNOSIS — I10 Essential (primary) hypertension: Secondary | ICD-10-CM

## 2015-04-14 LAB — COMPREHENSIVE METABOLIC PANEL
ALT: 22 U/L (ref 0–35)
AST: 20 U/L (ref 0–37)
Albumin: 3.9 g/dL (ref 3.5–5.2)
Alkaline Phosphatase: 55 U/L (ref 39–117)
BUN: 20 mg/dL (ref 6–23)
CO2: 30 mEq/L (ref 19–32)
Calcium: 9.7 mg/dL (ref 8.4–10.5)
Chloride: 102 mEq/L (ref 96–112)
Creatinine, Ser: 0.98 mg/dL (ref 0.40–1.20)
GFR: 59.22 mL/min — ABNORMAL LOW (ref >60.00)
Glucose, Bld: 151 mg/dL — ABNORMAL HIGH (ref 70–99)
Potassium: 4.5 mEq/L (ref 3.5–5.1)
Sodium: 139 mEq/L (ref 135–145)
Total Bilirubin: 0.7 mg/dL (ref 0.2–1.2)
Total Protein: 7.4 g/dL (ref 6.0–8.3)

## 2015-04-14 LAB — CBC WITH DIFFERENTIAL/PLATELET
Basophils Absolute: 0 10*3/uL (ref 0.0–0.1)
Basophils Relative: 0.4 % (ref 0.0–3.0)
Eosinophils Absolute: 0.1 10*3/uL (ref 0.0–0.7)
Eosinophils Relative: 2.3 % (ref 0.0–5.0)
HCT: 41.3 % (ref 36.0–46.0)
Hemoglobin: 13.9 g/dL (ref 12.0–15.0)
Lymphocytes Relative: 25.2 % (ref 12.0–46.0)
Lymphs Abs: 1.4 10*3/uL (ref 0.7–4.0)
MCHC: 33.6 g/dL (ref 30.0–36.0)
MCV: 84.6 fl (ref 78.0–100.0)
Monocytes Absolute: 0.4 10*3/uL (ref 0.1–1.0)
Monocytes Relative: 7.3 % (ref 3.0–12.0)
Neutro Abs: 3.7 10*3/uL (ref 1.4–7.7)
Neutrophils Relative %: 64.8 % (ref 43.0–77.0)
Platelets: 246 10*3/uL (ref 150.0–400.0)
RBC: 4.89 Mil/uL (ref 3.87–5.11)
RDW: 14.1 % (ref 11.5–15.5)
WBC: 5.7 10*3/uL (ref 4.0–10.5)

## 2015-04-14 LAB — LDL CHOLESTEROL, DIRECT: Direct LDL: 77 mg/dL

## 2015-04-14 LAB — URINALYSIS, ROUTINE W REFLEX MICROSCOPIC
Bilirubin Urine: NEGATIVE
Hgb urine dipstick: NEGATIVE
Ketones, ur: NEGATIVE
Leukocytes, UA: NEGATIVE
Nitrite: NEGATIVE
RBC / HPF: NONE SEEN (ref 0–?)
Specific Gravity, Urine: 1.01 (ref 1.000–1.030)
Total Protein, Urine: NEGATIVE
Urine Glucose: NEGATIVE
Urobilinogen, UA: 0.2 (ref 0.0–1.0)
WBC, UA: NONE SEEN (ref 0–?)
pH: 6 (ref 5.0–8.0)

## 2015-04-14 LAB — LIPID PANEL
Cholesterol: 152 mg/dL (ref 0–200)
HDL: 40.8 mg/dL (ref >39.00)
NonHDL: 111.32
Total CHOL/HDL Ratio: 4
Triglycerides: 203 mg/dL — ABNORMAL HIGH (ref 0.0–149.0)
VLDL: 40.6 mg/dL — ABNORMAL HIGH (ref 0.0–40.0)

## 2015-04-14 LAB — HEMOGLOBIN A1C: Hgb A1c MFr Bld: 7.7 % — ABNORMAL HIGH (ref 4.6–6.5)

## 2015-04-14 LAB — MICROALBUMIN / CREATININE URINE RATIO
Creatinine,U: 88.8 mg/dL
Microalb Creat Ratio: 2.3 mg/g (ref 0.0–30.0)
Microalb, Ur: 2 mg/dL — ABNORMAL HIGH (ref 0.0–1.9)

## 2015-04-14 LAB — URIC ACID: Uric Acid, Serum: 3.3 mg/dL (ref 2.4–7.0)

## 2015-04-14 NOTE — Progress Notes (Signed)
Subjective:  Patient ID: Nicole [name], female    DOB: [birth year]  Age: 75 y.o. MRN: [id number]  CC: Hyperlipidemia; Diabetes; and Hypertension   HPI Nicole [name] presents for follow-up on hypercholesterolemia, hypertension, and diabetes. She complains that the new formulation of metformin, the quick release form, has caused her to experience several loose bowel movements a day with some decrease in her appetite and about an 8 pound weight loss. She also doesn't think her blood sugars have improved much with just metformin. She denies polyuria, polydipsia, polyphagia.  She tells me that her blood pressures have been well controlled with the combination of Bystolic and losartan. She denies headache, blurred vision, chest pain, shortness of breath, dyspnea on exertion, or edema.  Outpatient Prescriptions Prior to Visit  Medication Sig Dispense Refill  . allopurinol (ZYLOPRIM) 100 MG tablet Take 1 tablet (100 mg total) by mouth daily. 90 tablet 3  . CALCIUM-VITAMIN D PO Take by mouth.    . Cyanocobalamin (VITAMIN B-12 CR PO) Take 1 tablet by mouth daily.    [name] DHEA 25 MG CAPS Take 25 mg by mouth 2 (two) times daily.    [name] FREESTYLE LITE test strip TEST once daily as directed 100 each 11  . losartan (COZAAR) 100 MG tablet Take 1 tablet (100 mg total) by mouth daily. 90 tablet 3  . Multiple Vitamins-Minerals (MULTIVITAMIN WITH MINERALS) tablet Take 1 tablet by mouth daily.    . nebivolol (BYSTOLIC) 10 MG tablet Take 1 tablet (10 mg total) by mouth daily. 119 tablet 0  . pramoxine-hydrocortisone (PROCTOCREAM-HC) 1-1 % rectal cream Place 1 application rectally 3 (three) times daily. 30 g 1  . Turmeric 450 MG CAPS Take 450 mg by mouth 2 (two) times daily.    . metFORMIN (GLUCOPHAGE) 1000 MG tablet Take 1 tablet (1,000 mg total) by mouth 2 (two) times daily with a meal. 180 tablet 1   No facility-administered medications prior to visit.    ROS Review of Systems  Constitutional: Positive for  unexpected weight change. Negative for fever, chills, diaphoresis, appetite change and fatigue.  HENT: Negative.  Negative for trouble swallowing and voice change.   Eyes: Negative.   Respiratory: Negative.  Negative for cough, choking, chest tightness, shortness of breath and stridor.   Cardiovascular: Negative.  Negative for chest pain, palpitations and leg swelling.  Gastrointestinal: Positive for diarrhea. Negative for nausea, vomiting, abdominal pain, constipation, blood in stool, abdominal distention and rectal pain.  Endocrine: Negative.  Negative for polydipsia, polyphagia and polyuria.  Genitourinary: Negative.  Negative for dysuria, frequency, hematuria and difficulty urinating.  Musculoskeletal: Negative.  Negative for myalgias, back pain, joint swelling and arthralgias.  Skin: Negative.  Negative for color change and rash.  Allergic/Immunologic: Negative.   Neurological: Negative.   Hematological: Negative.  Negative for adenopathy. Does not bruise/bleed easily.  Psychiatric/Behavioral: Negative.     Objective:  BP 134/80 mmHg  Pulse 83  Temp(Src) 98.5 F (36.9 C) (Oral)  Resp 16  Ht '5\' 8"'$  (1.727 m)  Wt 191 lb (86.637 kg)  BMI 29.05 kg/m2  SpO2 97%  BP Readings from Last 3 Encounters:  04/14/15 134/80  01/16/15 160/88  10/31/14 132/84    Wt Readings from Last 3 Encounters:  04/14/15 191 lb (86.637 kg)  01/16/15 199 lb (90.266 kg)  10/31/14 197 lb (89.359 kg)    Physical Exam  Constitutional: She is oriented to person, place, and time. No distress.  HENT:  Mouth/Throat: Oropharynx is clear  and moist. No oropharyngeal exudate.  Eyes: Conjunctivae are normal. Right eye exhibits no discharge. Left eye exhibits no discharge. No scleral icterus.  Neck: Normal range of motion. Neck supple. No JVD present. No tracheal deviation present. No thyromegaly present.  Cardiovascular: Normal rate, regular rhythm, normal heart sounds and intact distal pulses.  Exam reveals no  gallop and no friction rub.   No murmur heard. Pulmonary/Chest: Effort normal and breath sounds normal. No stridor. No respiratory distress. She has no wheezes. She has no rales. She exhibits no tenderness.  Abdominal: Soft. Bowel sounds are normal. She exhibits no distension and no mass. There is no tenderness. There is no rebound and no guarding.  Musculoskeletal: Normal range of motion. She exhibits no edema or tenderness.  Lymphadenopathy:    She has no cervical adenopathy.  Neurological: She is oriented to person, place, and time.  Skin: Skin is warm and dry. No rash noted. She is not diaphoretic. No erythema. No pallor.  Vitals reviewed.   Lab Results  Component Value Date   WBC 5.7 04/14/2015   HGB 13.9 04/14/2015   HCT 41.3 04/14/2015   PLT 246.0 04/14/2015   GLUCOSE 151* 04/14/2015   CHOL 152 04/14/2015   TRIG 203.0* 04/14/2015   HDL 40.80 04/14/2015   LDLDIRECT 77.0 04/14/2015   LDLCALC 89 10/31/2011   ALT 22 04/14/2015   AST 20 04/14/2015   NA 139 04/14/2015   K 4.5 04/14/2015   CL 102 04/14/2015   CREATININE 0.98 04/14/2015   BUN 20 04/14/2015   CO2 30 04/14/2015   TSH 1.14 06/30/2014   INR 1.01 07/19/2013   HGBA1C 7.7* 04/14/2015   MICROALBUR 2.0* 04/14/2015    Dg Chest 2 View  07/17/2013  CLINICAL DATA:  Chest pain, history hypertension, diabetes, uterine cancer EXAM: CHEST  2 VIEW COMPARISON:  03/19/2012 FINDINGS: Upper normal heart size. Calcified tortuous aorta. Mediastinal contours and pulmonary vascularity normal. Lungs clear. No pleural effusion or pneumothorax. Scattered endplate spur formation thoracic spine pre IMPRESSION: No acute abnormalities. Electronically Signed   By: Lavonia Dana M.D.   On: 07/17/2013 13:50   Nm Myocar Multi W/spect W/wall Motion / Ef  07/18/2013  CLINICAL DATA:  Chest pain, diabetes, hypertension, hyperlipidemia, and dyspnea on exertion. EXAM: MYOCARDIAL IMAGING WITH SPECT (REST AND PHARMACOLOGIC-STRESS) GATED LEFT VENTRICULAR  WALL MOTION STUDY LEFT VENTRICULAR EJECTION FRACTION TECHNIQUE: Standard myocardial SPECT imaging was performed after resting intravenous injection of 10 mCi [id number]. Subsequently, intravenous infusion of Lexiscan was performed under the supervision of the Cardiology staff. At peak effect of the drug, 30 mCi [id number] was injected intravenously and standard myocardial SPECT imaging was performed. Quantitative gated imaging was also performed to evaluate left ventricular wall motion, and estimate left ventricular ejection fraction. COMPARISON:  Two-view chest x-ray 07/17/2013. FINDINGS: Cardiac uptake is somewhat attenuated along the diaphragm. Uptake is otherwise normal. Contractility is within normal limits. The stress images demonstrate slight decreased uptake within the anterior apical segments suggesting reversible ischemia. Contractility and wall motion is normal. The gated images demonstrate an estimated diastolic volume of 68 mL an estimated systolic volume 15 mL. The calculated ejection fraction is 78%. IMPRESSION: 1. Mild inducible ischemia within the anterior apical segments. 2. Normal contractility and wall motion. 3. Normal function with an estimated ejection fraction of 78%. Electronically Signed   By: ChLawrence Santiago.D.   On: 07/18/2013 13:47    Assessment & Plan:   AmShantriceas seen today for hyperlipidemia, diabetes and hypertension.  Diagnoses and all orders for this visit:  Essential hypertension- her blood pressure is well-controlled, electrolytes and renal function are stable. -     Comprehensive metabolic panel; Future -     Urinalysis, Routine w reflex microscopic (not at [facility]); Future -     CBC with Differential/Platelet; Future  Type 2 diabetes mellitus with complication, without long-term current use of insulin ([place])- she is not tolerating the current relation of metformin and her A1c is not at the goal of less than 7.5%. I have therefore changed her to  Jentadueto XR, I am hopeful that the extended release formulation of metformin will be more tolerable for her and the addition of a DPP 4 inhibitor will help her gain better blood sugar control. -     Lipid panel; Future -     Comprehensive metabolic panel; Future -     Microalbumin / creatinine urine ratio; Future -     Hemoglobin A1c; Future -     Linagliptin-Metformin HCl ER (JENTADUETO XR) 2.05-998 MG TB24; Take 2 tablets by mouth daily.  Idiopathic gout, unspecified chronicity, unspecified site- she has had no recent flares and is achieved her uric acid goal of less than 6, will continue allopurinol -     Uric acid; Future -     Comprehensive metabolic panel; Future  HYPERTRIGLYCERIDEMIA- improvement noted, the triglyceride level is not high enough to warrant therapy at this time. -     Lipid panel; Future  Hyperlipidemia with target LDL less than 100- she is achieved her LDL goal and is not willing to take a statin. -     Lipid panel; Future  I have discontinued Ms. Derego's metFORMIN. I am also having her start on Linagliptin-Metformin HCl ER. Additionally, I am having her maintain her Cyanocobalamin (VITAMIN B-12 CR PO), losartan, allopurinol, Turmeric, DHEA, multivitamin with minerals, CALCIUM-VITAMIN D PO, pramoxine-hydrocortisone, FREESTYLE LITE, and nebivolol.  Meds ordered this encounter  Medications  . Linagliptin-Metformin HCl ER (JENTADUETO XR) 2.05-998 MG TB24    Sig: Take 2 tablets by mouth daily.    Dispense:  180 tablet    Refill:  3     Follow-up: Return in about 4 months (around 08/14/2015).  Scarlette Calico, MD

## 2015-04-14 NOTE — Patient Instructions (Signed)

## 2015-04-14 NOTE — Progress Notes (Signed)
Pre visit review using our clinic review tool, if applicable. No additional management support is needed unless otherwise documented below in the visit note. 

## 2015-04-15 MED ORDER — LINAGLIPTIN-METFORMIN HCL ER 2.5-1000 MG PO TB24: 2.0000 | ORAL_TABLET | Freq: Every day | ORAL | Status: DC

## 2015-04-16 ENCOUNTER — Telehealth: Payer: Self-pay

## 2015-04-16 DIAGNOSIS — E118 Type 2 diabetes mellitus with unspecified complications: Secondary | ICD-10-CM

## 2015-04-16 MED ORDER — METFORMIN HCL ER 750 MG PO TB24: 1500.0000 mg | ORAL_TABLET | Freq: Every day | ORAL | Status: DC

## 2015-04-16 NOTE — Addendum Note (Signed)
Addended by: [name] on: 04/16/2015 04:32 PM   Modules accepted: Orders, Medications

## 2015-04-16 NOTE — Telephone Encounter (Signed)
Recd fax from patient's pharm---[facility] on [address]---saying that jentadueto xr is too expensive ($681)---is there a substitute that you can send in for patient?---please advise, thanks

## 2015-04-16 NOTE — Telephone Encounter (Signed)
[facility]  Advanced Heart Failure and Transplant  Heart Transplant Clinic  Follow-up Visit    Primary Care Physician: Brodsky, Mark E  Referring Provider: Brett Justin Berman  Date of Transplant: 03/15/2019  Organ(s) Transplanted: heart  Indication for transplant: Dilated Myopathy: Idiopathic  PHS increased risk donor: Yes    ID. 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    Interval History:    The patient was last seen on 05/10/21. At that time issues with pain after urologic procedure.    He continues to deal with pain issue largely from prostate surgery. He still has some bleeding and some tissue come out. He tried different strategies and nothing helped. This is really impacting quality of life. He gets tired and frustrated and does not want to take it out on his family.    ROS:  A complete ROS was performed and is negative except as documented in the HPI.      Allergies:  Patient is allergic to cats [other] and dogs [other].    Past Medical History:   Diagnosis Date    Asthma     Atrial fibrillation (CMS-HCC)     Chronic HFrEF (heart failure with reduced ejection fraction) (CMS-HCC)     GERD (gastroesophageal reflux disease)     HTN (hypertension)     Insomnia     Nephrolithiasis     Sinusitis      Patient Active Problem List   Diagnosis    COPD (chronic obstructive pulmonary disease) (CMS-HCC)    Heart transplant, orthotopic, 03/15/2019    Pericardial effusion    Hypertension    Chronic back pain    At risk for infection transmitted from donor    Acute hepatitis C virus infection    Heart transplanted (CMS-HCC)    Acute UTI    Umbilical hernia without obstruction and without gangrene    COVID-19 virus detected    Acute medial meniscus tear of left knee, sequela    Localized osteoarthritis of left knee     Past Surgical History:   Procedure Laterality Date    CARDIAC DEFIBRILLATOR PLACEMENT       PB ANESTH,SHOULDER JOINT,NOS Right      Family History   Problem Relation Name Age of Onset    Hypertension Other      Other Maternal Grandmother          kidney disease needing HD     Social History     Socioeconomic History    Marital status: Single     Spouse name: Not on file    Number of children: Not on file    Years of education: Not on file    Highest education level: Not on file   Occupational History    Not on file   Tobacco Use    Smoking status: Never    Smokeless tobacco: Never    Tobacco comments:     from friends and relatives    Substance and Sexual Activity    Alcohol use: Not Currently     Comment: Prior heavier use, but completely quit in 2016    Drug use: Yes     Comment: eats edible marijuana for pain and insomnia     Sexual activity: Not on file   Other Topics Concern    Not on file   Social History Narrative      Born in [place], also lived in [place], [place], [place], no travel, worked as a carpenter, occasional cedar, no birds, no hot tubs, worked in construction + possible asbestos exposure      Social Determinants of Health     Financial Resource Strain: Not on file   Food Insecurity: Not on file   Transportation Needs: Not on file   Physical Activity: Not on file   Stress: Not on file   Social Connections: Not on file   Intimate Partner Violence: Not on file   Housing Stability: Not on file     Current Outpatient Medications   Medication Sig    albuterol 108 (90 Base) MCG/ACT inhaler Inhale 2 puffs by mouth every 4 hours as needed for Wheezing or Shortness of Breath.    aspirin 81 MG EC tablet Take 1 tablet (81 mg) by mouth daily.    baclofen (LIORESAL) 10 MG tablet Take 2 tablets (20 mg) by mouth nightly.    Blood Glucose Monitoring Suppl (TRUE METRIX METER) w/Device KIT Use as directed    budesonide-formoterol (SYMBICORT) 160-4.5 MCG/ACT inhaler Inhale 2 puffs by mouth every 12 hours.    bumetanide (BUMEX) 1 MG tablet Take 1 tablet (1 mg) by mouth daily as needed (fluid/weight  gain). Do not take unless instructed by Transplant team.    Calcium Carb-Cholecalciferol 600-10 MG-MCG TABS Take 1 tablet by mouth 2 times daily.    Cetirizine HCl (ZERVIATE) 0.24 % SOLN Place 1 drop into both eyes 2 times daily.    clindamycin (CLEOCIN T) 1 % solution Apply 1 Application. topically 2 times daily. Apply to the red bumps on your face up to two times a day.    controlled substance agreement controlled substance agreement    diclofenac (VOLTAREN) 1 % gel Apply 2 g topically 4 times daily.    docusate sodium (COLACE) 100 MG capsule Take 1 capsule (100 mg) by mouth 2 times daily.    DULoxetine (CYMBALTA) 30 MG CR capsule Take 1 capsule (30 mg) by mouth daily.    famotidine (PEPCID) 20 MG tablet Take 1 tablet (20 mg) by mouth 2 times daily.    fluticasone propionate (FLONASE) 50 MCG/ACT nasal spray Spray 1 spray into each nostril 2 times daily.    gabapentin (NEURONTIN) 300 MG capsule Take 1 capsule (300 mg) by mouth every morning AND 1 capsule (300 mg) daily AND 2 capsules (600 mg) every evening.    hydroCHLOROthiazide (HYDRODIURIL) 25 MG tablet Take 1 tablet (25 mg) by mouth daily.    ketoconazole (NIZORAL) 2 % shampoo Use shampoo daily for dandruff    lidocaine (LIDOCAINE PAIN RELIEF) 4 % patch Apply 1 patch topically every 24 hours. Leave patch on for 12 hours, then remove for 12 hours.    lisinopril (PRINIVIL, ZESTRIL) 10 MG tablet Take 2 tablets (20 mg) by mouth daily.    magnesium oxide (MAG-OX) 400 MG tablet Take 1 tablet by mouth daily    melatonin (GNP MELATONIN MAXIMUM STRENGTH) 5 MG tablet Take 2 tablets (10 mg) by mouth at bedtime.    Multiple Vitamin (MULTIVITAMIN) TABS tablet Take 1 tablet by mouth daily.    naloxone (KLOXXADO) 8 mg/0.1 mL nasal spray Call 911! Tilt head and spray intranasally into one nostril as needed for respiratory depression. If patient does not respond or responds and then relapses, repeat using a new nasal spray every 3 minutes until emergency medical assistance  arrives.    NEEDLE, DISP, 25 G 25G X   1" MISC Use to inject testosterone    NIFEdipine (ADALAT CC) 30 MG Controlled-Release tablet Take 1 tablet (30 mg) by mouth nightly.    ondansetron (ZOFRAN) 8 MG tablet Take 1 tablet (8 mg) by mouth every 8 hours as needed for Nausea/Vomiting.    oxyCODONE (ROXICODONE) 10 MG tablet Take 1 tab every 4 hours as needed for moderate pain and 2 tabs every 4 hours as needed for severe pain. Max 10 tabs per day, 28 day supply    phenazopyridine (PYRIDIUM) 100 MG tablet Take 1 tablet (100 mg) by mouth 3 times daily.    polyethylene glycol (GLYCOLAX) 17 GM/SCOOP powder Mix 17 grams in 4-8 oz of liquide and drink by mouth daily as needed (Constipation).    pravastatin (PRAVACHOL) 40 MG tablet Take 1 tablet (40 mg) by mouth every evening.    senna (SENOKOT) 8.6 MG tablet Take 1 tablet (8.6 mg) by mouth daily.    sirolimus (RAPAMUNE) 1 MG tablet Take 2 tablets (2 mg) by mouth every morning.    SYRINGE-NEEDLE, DISP, 3 ML (B-D 3CC LUER-LOK SYR 25GX1") 25G X 1" 3 ML MISC Use as directed to inject testosterone    SYRINGE-NEEDLE, DISP, 3 ML 18G X 1-1/2" 3 ML MISC Use to draw up testosterone    tacrolimus (ENVARSUS XR) 1 MG tablet STOP TAKING since 10/06/21 - remaining on chart for dose adjustments, titratable med.    tacrolimus (ENVARSUS XR) 4 MG tablet Take 1 tablet (4 mg) by mouth every morning.    tamsulosin (FLOMAX) 0.4 MG capsule Take 1 capsule (0.4 mg) by mouth daily.    tamsulosin (FLOMAX) 0.4 MG capsule Take 1 capsule (0.4 mg) by mouth daily.    testosterone cypionate (DEPO-TESTOSTERONE) 200 MG/ML SOLN Inject 1 ml into the muscle every 14 days    traZODone (DESYREL) 50 MG tablet Take 1 tablet (50 mg) by mouth nightly.     Current Facility-Administered Medications   Medication    diphenhydrAMINE (BENADRYL) injection 50 mg    diphenhydrAMINE (BENADRYL) tablet 50 mg     Immunization History   Administered Date(s) Administered    COVID-19 (Moderna) Low Dose Red Cap >= 18 Years 03/06/2020     COVID-19 (Moderna) Red Cap >= 12 Years 04/13/2019, 05/13/2019, 09/11/2019    Hep-A/Hep-B; Twinrix, Adult 04/06/2020    Influenza Vaccine (High Dose) Quadrivalent >=65 Years 12/20/2019    Influenza Vaccine (Unspecified) 09/24/2016    Influenza Vaccine >=6 Months 12/07/2009, 12/07/2010, 02/02/2012, 10/30/2013, 10/13/2017, 10/29/2018    Pneumococcal 13 Vaccine (PREVNAR-13) 12/06/2019    Pneumococcal 23 Vaccine (PNEUMOVAX-23) 12/07/2012, 04/06/2020    Tdap 01/25/2011   Deferred Date(s) Deferred    Pneumococcal 23 Vaccine (PNEUMOVAX-23) 03/28/2019     Physical Exam:  BP 102/69 (BP Location: Right arm, BP Patient Position: Sitting, BP cuff size: Large)   Pulse 98   Temp 98.5 F (36.9 C) (Temporal)   Resp 16   Ht 5' 10" (1.778 m)   Wt 96.2 kg (212 lb)   SpO2 97%   BMI 30.42 kg/m      General Appearance: ***alert, no distress, pleasant affect, cooperative.  Heart:  JVD ***, PMI ***, normal rate and regular rhythm, no murmurs, clicks, or gallops. ***  Lungs: ***clear to auscultation and percussion. No rales, rhonchi, or wheezes noted. No chest deformities noted.  Abdomen: ***BS normal.  Abdomen soft, non-tender.  No masses or organomegaly.  Extremities:  ***no cyanosis, clubbing, or edema. Has 2+ peripheral pulses.        Lab Data:  Lab Results   Component Value Date    BUN 26 (H) 10/06/2021    CREAT 1.98 (H) 10/06/2021    CL 99 10/06/2021    NA 140 10/06/2021    K 4.4 10/06/2021    CA 9.2 10/06/2021    TBILI 0.47 10/06/2021    ALB 4.1 10/06/2021    TP 7.1 10/06/2021    AST 22 10/06/2021    ALK 76 10/06/2021    BICARB 29 10/06/2021    ALT 25 10/06/2021    GLU 126 (H) 10/06/2021     Lab Results   Component Value Date    WBC 7.9 10/06/2021    RBC 5.50 10/06/2021    HGB 15.2 10/06/2021    HCT 46.5 10/06/2021    MCV 84.5 10/06/2021    MCHC 32.7 10/06/2021    RDW 12.3 10/06/2021    PLT 162 10/06/2021    MPV 11.6 10/06/2021     Lab Results   Component Value Date    A1C 5.7 03/05/2021     Lab Results   Component Value Date     TSH 1.63 03/05/2021     Lab Results   Component Value Date    CHOL 105 03/05/2021    HDL 38 03/05/2021    LDLCALC 43 03/05/2021    TRIG 121 03/05/2021     Lab Results   Component Value Date    SIROT 11.5 10/06/2021     Lab Results   Component Value Date    FKTR 6.5 10/06/2021     No results found for: CSATR  Lab Results   Component Value Date    CMVPL Not Detected 12/25/2020     Lab Results   Component Value Date    DSA ABSENT 10/06/2021       Prior Cardiovascular Studies:   Lab Results   Component Value Date    LV Ejection Fraction 59 04/01/2021          Echo 04/01/21  Summary:   1. The left ventricular size is normal. The left ventricular systolic function is normal.   2. No left ventricular hypertrophy.   3. Normal pattern of left ventricular diastolic filling.   4. EF=59%.   5. Compared to prior study EF now 59%, was 69% 04/24/20.     LHC/IVUS 03/30/21  CONCLUSION:                                                                   1. 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                              2. No angiographic evidence of coronary artery disease.                      3. Intimal thickness noted in LAD/LM up to 0.5 mm (Stable to slightly       worse compare to 2022).                                                         4. Non significant FFR at apical LAD.                                        5. Left ventricular end diastolic pressure appears normal.        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    Assessment/Plan:  # Hematuria  # Dysuria  # Chronic pain  Assessment: We had a long frank discussion about patient's chronic pain issues and the heart transplant team's role in this. I discussed with him that when I initially agreed to cover his chronic opiate prescription, this was the assumption that he would  have a provider versed in chronic pain after 3-4 months, but we are at 6 months and has unable to find one. Additionally, I had not put him on a pain contract at that time, but he recently used more opiates without asking and I informed him this was not appropriate, but because he had not established guidelines I was not going to stop at this time. However, going forward until he can establish with a pain physician, we will set up a pain contract and he will need to follow through like a usual pain clinic with us with goal of provider in 3-4 months or I may start tapering. I will augment adjuvant agents additionally for now and we can continue to work on this.  Plan:  -pain contract signed  -urine tox monthly  -clinic follow up month  -oxycodone 10 mg tablets PO, 1 tab every 4 hours moderate pain, 2 tabs every 4 hours for severe pain, no more than 10 tablets a day, total 280 per 28 days.   -diclofenac cream for joint pain  -lidocaine patch for back pain  -trial of pyridium  -increase gaba at night  -siro change as below  -cymbalta as below    # End-stage heart failure s/p orthotopic heart transplant  # Chronic Immunosuppression/Immunomodulation  Assessment: While we thought continuing sirolimus would help prevent recurrent scar tissue from prostate procedure, it may be exacerbating factors now with delayed wound healing. Will try mmf for 1 month.  Plan:   - continue envarsus 6 mg daily, goal trough 4-8  - HOLD sirolimus 3 mg daily, goal trough 4-8 for at least 1 month  - start mmf 1000 mg bid for one month to allow healing  - Continue to monitor for renal toxicities, infection risk and malignancy risk  - continue pravastatin 40 mg daily  - continue aspirin 81 mg daily    # Hypertension  Assessment: controlled  Plan:  -continue lisinopril 20 mg daily  -resume hctz  -nifedipine 30 mg daily    # Dyslipidemia  -continue pravastatin 40 mg daily    # Depression  Assessment: improved mood  Plan:  -increase cymbalta to 120  mg daily     RTC in 1 month       Nicholas W Wettersten, MD  Advanced Heart Failure, Mechanical Circulatory Support, Transplant  Pgr: [phone]

## 2015-06-08 DIAGNOSIS — H40012 Open angle with borderline findings, low risk, left eye: Secondary | ICD-10-CM | POA: Diagnosis not present

## 2015-06-08 DIAGNOSIS — H25013 Cortical age-related cataract, bilateral: Secondary | ICD-10-CM | POA: Diagnosis not present

## 2015-06-08 DIAGNOSIS — E119 Type 2 diabetes mellitus without complications: Secondary | ICD-10-CM | POA: Diagnosis not present

## 2015-06-08 DIAGNOSIS — H40011 Open angle with borderline findings, low risk, right eye: Secondary | ICD-10-CM | POA: Diagnosis not present

## 2015-06-08 LAB — HM DIABETES EYE EXAM

## 2015-06-09 ENCOUNTER — Other Ambulatory Visit: Payer: Self-pay | Admitting: Internal Medicine

## 2015-06-09 DIAGNOSIS — I10 Essential (primary) hypertension: Secondary | ICD-10-CM

## 2015-06-09 DIAGNOSIS — E118 Type 2 diabetes mellitus with unspecified complications: Secondary | ICD-10-CM

## 2015-06-09 MED ORDER — LOSARTAN POTASSIUM 100 MG PO TABS: 100.0000 mg | ORAL_TABLET | Freq: Every day | ORAL | Status: DC

## 2015-06-30 ENCOUNTER — Other Ambulatory Visit: Payer: Self-pay

## 2015-06-30 ENCOUNTER — Encounter: Payer: Self-pay | Admitting: Internal Medicine

## 2015-06-30 DIAGNOSIS — M1000 Idiopathic gout, unspecified site: Secondary | ICD-10-CM

## 2015-06-30 MED ORDER — ALLOPURINOL 100 MG PO TABS: 100.0000 mg | ORAL_TABLET | Freq: Every day | ORAL | Status: DC

## 2015-07-15 ENCOUNTER — Ambulatory Visit (INDEPENDENT_AMBULATORY_CARE_PROVIDER_SITE_OTHER): Payer: Medicare Other | Admitting: Internal Medicine

## 2015-07-15 ENCOUNTER — Encounter: Payer: Self-pay | Admitting: Internal Medicine

## 2015-07-15 VITALS — BP 150/88 | HR 73 | Temp 97.8°F | Resp 16 | Ht 68.0 in | Wt 187.0 lb

## 2015-07-15 DIAGNOSIS — I10 Essential (primary) hypertension: Secondary | ICD-10-CM

## 2015-07-15 DIAGNOSIS — E118 Type 2 diabetes mellitus with unspecified complications: Secondary | ICD-10-CM

## 2015-07-15 DIAGNOSIS — E669 Obesity, unspecified: Secondary | ICD-10-CM

## 2015-07-15 LAB — POCT GLYCOSYLATED HEMOGLOBIN (HGB A1C): Hemoglobin A1C: 7.7

## 2015-07-15 MED ORDER — METFORMIN HCL ER 750 MG PO TB24: 1500.0000 mg | ORAL_TABLET | Freq: Every day | ORAL | Status: DC

## 2015-07-15 NOTE — Progress Notes (Signed)
Subjective:  Patient ID: Nicole [name], female    DOB: [birth year]  Age: 75 y.o. MRN: [id number]  CC: Hypertension and Diabetes   HPI HECTOR VENNE presents for Follow-up on hypertension and diabetes. She offers no complaints today other than that she has not been able to lose weight since I last saw her. She has had no episodes of blurred vision, polyuria, polydipsia, or polyphagia. She tells me at home her blood pressures been adequately well-controlled with no episodes of chest pain, shortness of breath, palpitations, dyspnea on exertion, edema, or fatigue.  Outpatient Prescriptions Prior to Visit  Medication Sig Dispense Refill  . allopurinol (ZYLOPRIM) 100 MG tablet Take 1 tablet (100 mg total) by mouth daily. 90 tablet 3  . CALCIUM-VITAMIN D PO Take by mouth.    . Cyanocobalamin (VITAMIN B-12 CR PO) Take 1 tablet by mouth daily.    [name] DHEA 25 MG CAPS Take 25 mg by mouth 2 (two) times daily.    [name] FREESTYLE LITE test strip TEST once daily as directed 100 each 11  . losartan (COZAAR) 100 MG tablet Take 1 tablet (100 mg total) by mouth daily. 90 tablet 3  . Multiple Vitamins-Minerals (MULTIVITAMIN WITH MINERALS) tablet Take 1 tablet by mouth daily.    . nebivolol (BYSTOLIC) 10 MG tablet Take 1 tablet (10 mg total) by mouth daily. 119 tablet 0  . pramoxine-hydrocortisone (PROCTOCREAM-HC) 1-1 % rectal cream Place 1 application rectally 3 (three) times daily. 30 g 1  . Turmeric 450 MG CAPS Take 450 mg by mouth 2 (two) times daily.    . metFORMIN (GLUCOPHAGE-XR) 750 MG 24 hr tablet Take 2 tablets (1,500 mg total) by mouth daily with breakfast. 180 tablet 1   No facility-administered medications prior to visit.    ROS Review of Systems  Constitutional: Negative.  Negative for diaphoresis and fatigue.  HENT: Negative.   Eyes: Negative.  Negative for visual disturbance.  Respiratory: Negative.  Negative for cough, chest tightness, shortness of breath and stridor.   Cardiovascular:  Negative.  Negative for chest pain, palpitations and leg swelling.  Gastrointestinal: Negative.  Negative for nausea, vomiting, abdominal pain, diarrhea and constipation.  Endocrine: Negative.  Negative for polydipsia, polyphagia and polyuria.  Genitourinary: Negative.  Negative for dysuria.  Musculoskeletal: Negative.  Negative for myalgias, back pain and neck pain.  Skin: Negative.  Negative for color change and rash.  Allergic/Immunologic: Negative.   Neurological: Negative.  Negative for dizziness, weakness and light-headedness.  Hematological: Negative.  Negative for adenopathy. Does not bruise/bleed easily.  Psychiatric/Behavioral: Negative.     Objective:  BP 150/88 mmHg  Pulse 73  Temp(Src) 97.8 F (36.6 C) (Oral)  Resp 16  Ht '5\' 8"'$  (1.727 m)  Wt 187 lb (84.823 kg)  BMI 28.44 kg/m2  SpO2 98%  BP Readings from Last 3 Encounters:  07/15/15 150/88  04/14/15 134/80  01/16/15 160/88    Wt Readings from Last 3 Encounters:  07/15/15 187 lb (84.823 kg)  04/14/15 191 lb (86.637 kg)  01/16/15 199 lb (90.266 kg)    Physical Exam  Constitutional: She is oriented to person, place, and time. No distress.  HENT:  Mouth/Throat: Oropharynx is clear and moist. No oropharyngeal exudate.  Eyes: Conjunctivae are normal. Right eye exhibits no discharge. Left eye exhibits no discharge. No scleral icterus.  Neck: Normal range of motion. Neck supple. No JVD present. No tracheal deviation present. No thyromegaly present.  Cardiovascular: Normal rate, regular rhythm, normal heart sounds and  intact distal pulses.  Exam reveals no gallop and no friction rub.   No murmur heard. Pulmonary/Chest: Effort normal and breath sounds normal. No stridor. No respiratory distress. She has no wheezes. She has no rales. She exhibits no tenderness.  Abdominal: Soft. Bowel sounds are normal. She exhibits no distension and no mass. There is no tenderness. There is no rebound and no guarding.  Musculoskeletal:  Normal range of motion. She exhibits no edema or tenderness.  Lymphadenopathy:    She has no cervical adenopathy.  Neurological: She is oriented to person, place, and time.  Skin: Skin is warm and dry. No rash noted. She is not diaphoretic. No erythema. No pallor.  Vitals reviewed.   Lab Results  Component Value Date   WBC 5.7 04/14/2015   HGB 13.9 04/14/2015   HCT 41.3 04/14/2015   PLT 246.0 04/14/2015   GLUCOSE 151* 04/14/2015   CHOL 152 04/14/2015   TRIG 203.0* 04/14/2015   HDL 40.80 04/14/2015   LDLDIRECT 77.0 04/14/2015   LDLCALC 89 10/31/2011   ALT 22 04/14/2015   AST 20 04/14/2015   NA 139 04/14/2015   K 4.5 04/14/2015   CL 102 04/14/2015   CREATININE 0.98 04/14/2015   BUN 20 04/14/2015   CO2 30 04/14/2015   TSH 1.14 06/30/2014   INR 1.01 07/19/2013   HGBA1C 7.7 07/15/2015   MICROALBUR 2.0* 04/14/2015    Dg Chest 2 View  07/17/2013  CLINICAL DATA:  Chest pain, history hypertension, diabetes, uterine cancer EXAM: CHEST  2 VIEW COMPARISON:  03/19/2012 FINDINGS: Upper normal heart size. Calcified tortuous aorta. Mediastinal contours and pulmonary vascularity normal. Lungs clear. No pleural effusion or pneumothorax. Scattered endplate spur formation thoracic spine pre IMPRESSION: No acute abnormalities. Electronically Signed   By: Lavonia Dana M.D.   On: 07/17/2013 13:50   Nm Myocar Multi W/spect W/wall Motion / Ef  07/18/2013  CLINICAL DATA:  Chest pain, diabetes, hypertension, hyperlipidemia, and dyspnea on exertion. EXAM: MYOCARDIAL IMAGING WITH SPECT (REST AND PHARMACOLOGIC-STRESS) GATED LEFT VENTRICULAR WALL MOTION STUDY LEFT VENTRICULAR EJECTION FRACTION TECHNIQUE: Standard myocardial SPECT imaging was performed after resting intravenous injection of 10 mCi Tc-35msestamibi. Subsequently, intravenous infusion of Lexiscan was performed under the supervision of the Cardiology staff. At peak effect of the drug, 30 mCi Tc-915mestamibi was injected intravenously and standard  myocardial SPECT imaging was performed. Quantitative gated imaging was also performed to evaluate left ventricular wall motion, and estimate left ventricular ejection fraction. COMPARISON:  Two-view chest x-ray 07/17/2013. FINDINGS: Cardiac uptake is somewhat attenuated along the diaphragm. Uptake is otherwise normal. Contractility is within normal limits. The stress images demonstrate slight decreased uptake within the anterior apical segments suggesting reversible ischemia. Contractility and wall motion is normal. The gated images demonstrate an estimated diastolic volume of 68 mL an estimated systolic volume 15 mL. The calculated ejection fraction is 78%. IMPRESSION: 1. Mild inducible ischemia within the anterior apical segments. 2. Normal contractility and wall motion. 3. Normal function with an estimated ejection fraction of 78%. Electronically Signed   By: ChLawrence Santiago.D.   On: 07/18/2013 13:47    Assessment & Plan:   AmMakalynnas seen today for hypertension and diabetes.  Diagnoses and all orders for this visit:  Obesity (BMI 30-39.9)- she agrees to reduce her caloric intake in an effort to lose weight  Type 2 diabetes mellitus with complication, without long-term current use of insulin ([place] her A1c remains at 7.7%, she is not willing to add another medication  at this time but does agree to work on her lifestyle modifications. -     metFORMIN (GLUCOPHAGE-XR) 750 MG 24 hr tablet; Take 2 tablets (1,500 mg total) by mouth daily with breakfast. -     POCT HgB A1C  Essential hypertension- her blood pressures adequately well-controlled.   I am having Ms. Callie Fielding maintain her Cyanocobalamin (VITAMIN B-12 CR PO), Turmeric, DHEA, multivitamin with minerals, CALCIUM-VITAMIN D PO, pramoxine-hydrocortisone, FREESTYLE LITE, nebivolol, losartan, allopurinol, and metFORMIN.  Meds ordered this encounter  Medications  . metFORMIN (GLUCOPHAGE-XR) 750 MG 24 hr tablet    Sig: Take 2 tablets (1,500 mg  total) by mouth daily with breakfast.    Dispense:  180 tablet    Refill:  1     Follow-up: Return in about 6 months (around 01/14/2016).  Scarlette Calico, MD

## 2015-07-15 NOTE — Patient Instructions (Signed)

## 2015-07-15 NOTE — Progress Notes (Signed)
Pre visit review using our clinic review tool, if applicable. No additional management support is needed unless otherwise documented below in the visit note. 

## 2015-07-20 DIAGNOSIS — N898 Other specified noninflammatory disorders of vagina: Secondary | ICD-10-CM | POA: Diagnosis not present

## 2015-08-30 DIAGNOSIS — K810 Acute cholecystitis: Secondary | ICD-10-CM | POA: Diagnosis not present

## 2015-11-09 ENCOUNTER — Other Ambulatory Visit (INDEPENDENT_AMBULATORY_CARE_PROVIDER_SITE_OTHER): Payer: Medicare Other

## 2015-11-09 ENCOUNTER — Ambulatory Visit (INDEPENDENT_AMBULATORY_CARE_PROVIDER_SITE_OTHER): Payer: Medicare Other | Admitting: Internal Medicine

## 2015-11-09 ENCOUNTER — Encounter: Payer: Self-pay | Admitting: Internal Medicine

## 2015-11-09 VITALS — BP 130/88 | HR 74 | Temp 98.0°F | Ht 68.0 in | Wt 185.0 lb

## 2015-11-09 DIAGNOSIS — K862 Cyst of pancreas: Secondary | ICD-10-CM | POA: Insufficient documentation

## 2015-11-09 DIAGNOSIS — E669 Obesity, unspecified: Secondary | ICD-10-CM

## 2015-11-09 DIAGNOSIS — Z23 Encounter for immunization: Secondary | ICD-10-CM

## 2015-11-09 DIAGNOSIS — E118 Type 2 diabetes mellitus with unspecified complications: Secondary | ICD-10-CM

## 2015-11-09 DIAGNOSIS — E2839 Other primary ovarian failure: Secondary | ICD-10-CM | POA: Insufficient documentation

## 2015-11-09 DIAGNOSIS — I10 Essential (primary) hypertension: Secondary | ICD-10-CM

## 2015-11-09 LAB — COMPREHENSIVE METABOLIC PANEL
ALT: 19 U/L (ref 0–35)
AST: 17 U/L (ref 0–37)
Albumin: 4 g/dL (ref 3.5–5.2)
Alkaline Phosphatase: 54 U/L (ref 39–117)
BUN: 26 mg/dL — ABNORMAL HIGH (ref 6–23)
CO2: 28 mEq/L (ref 19–32)
Calcium: 9.9 mg/dL (ref 8.4–10.5)
Chloride: 102 mEq/L (ref 96–112)
Creatinine, Ser: 1.13 mg/dL (ref 0.40–1.20)
GFR: 50.17 mL/min — ABNORMAL LOW (ref >60.00)
Glucose, Bld: 131 mg/dL — ABNORMAL HIGH (ref 70–99)
Potassium: 4.1 mEq/L (ref 3.5–5.1)
Sodium: 138 mEq/L (ref 135–145)
Total Bilirubin: 0.7 mg/dL (ref 0.2–1.2)
Total Protein: 7.8 g/dL (ref 6.0–8.3)

## 2015-11-09 LAB — CBC WITH DIFFERENTIAL/PLATELET
Basophils Absolute: 0 10*3/uL (ref 0.0–0.1)
Basophils Relative: 0.4 % (ref 0.0–3.0)
Eosinophils Absolute: 0.1 10*3/uL (ref 0.0–0.7)
Eosinophils Relative: 1.7 % (ref 0.0–5.0)
HCT: 38.8 % (ref 36.0–46.0)
Hemoglobin: 13.3 g/dL (ref 12.0–15.0)
Lymphocytes Relative: 29.2 % (ref 12.0–46.0)
Lymphs Abs: 2 10*3/uL (ref 0.7–4.0)
MCHC: 34.3 g/dL (ref 30.0–36.0)
MCV: 86.2 fl (ref 78.0–100.0)
Monocytes Absolute: 0.5 10*3/uL (ref 0.1–1.0)
Monocytes Relative: 7.3 % (ref 3.0–12.0)
Neutro Abs: 4.2 10*3/uL (ref 1.4–7.7)
Neutrophils Relative %: 61.4 % (ref 43.0–77.0)
Platelets: 278 10*3/uL (ref 150.0–400.0)
RBC: 4.5 Mil/uL (ref 3.87–5.11)
RDW: 15.6 % — ABNORMAL HIGH (ref 11.5–15.5)
WBC: 6.9 10*3/uL (ref 4.0–10.5)

## 2015-11-09 LAB — HEMOGLOBIN A1C: Hgb A1c MFr Bld: 7.6 % — ABNORMAL HIGH (ref 4.6–6.5)

## 2015-11-09 LAB — LIPASE: Lipase: 32 U/L (ref 11.0–59.0)

## 2015-11-09 NOTE — Patient Instructions (Signed)

## 2015-11-09 NOTE — Progress Notes (Signed)
Subjective:  Patient ID: Nicole [name], female    DOB: 1940-07-30  Age: 75 y.o. MRN: [id number]  CC: Hypertension and Diabetes   HPI ARRAYAH CONNORS presents for follow-up on hypertension and diabetes. Since I last saw her she went to [place] and developed acute cholelithiasis and underwent urgent cholecystectomy. She recovered nicely but on the MRI that they did of her abdomen it showed a cyst in her pancreas. She denies abdominal pain, nausea, vomiting, diarrhea, loss of appetite, weight loss. She tells me her blood pressure and her blood sugars have been well controlled.  Outpatient Medications Prior to Visit  Medication Sig Dispense Refill  . allopurinol (ZYLOPRIM) 100 MG tablet Take 1 tablet (100 mg total) by mouth daily. 90 tablet 3  . CALCIUM-VITAMIN D PO Take by mouth.    . Cyanocobalamin (VITAMIN B-12 CR PO) Take 1 tablet by mouth daily.    [name] DHEA 25 MG CAPS Take 25 mg by mouth 2 (two) times daily.    [name] FREESTYLE LITE test strip TEST once daily as directed 100 each 11  . losartan (COZAAR) 100 MG tablet Take 1 tablet (100 mg total) by mouth daily. 90 tablet 3  . metFORMIN (GLUCOPHAGE-XR) 750 MG 24 hr tablet Take 2 tablets (1,500 mg total) by mouth daily with breakfast. 180 tablet 1  . nebivolol (BYSTOLIC) 10 MG tablet Take 1 tablet (10 mg total) by mouth daily. 119 tablet 0  . pramoxine-hydrocortisone (PROCTOCREAM-HC) 1-1 % rectal cream Place 1 application rectally 3 (three) times daily. 30 g 1  . Turmeric 450 MG CAPS Take 450 mg by mouth 2 (two) times daily.    . Multiple Vitamins-Minerals (MULTIVITAMIN WITH MINERALS) tablet Take 1 tablet by mouth daily.     No facility-administered medications prior to visit.     ROS Review of Systems  Constitutional: Negative.  Negative for appetite change, chills, fatigue and unexpected weight change.  HENT: Negative.   Eyes: Negative.  Negative for photophobia and visual disturbance.  Respiratory: Negative.  Negative for cough,  choking, chest tightness, shortness of breath and stridor.   Cardiovascular: Negative for chest pain, palpitations and leg swelling.  Gastrointestinal: Negative.  Negative for abdominal pain, blood in stool, constipation, diarrhea, nausea and vomiting.  Endocrine: Negative for polydipsia, polyphagia and polyuria.  Genitourinary: Negative.  Negative for difficulty urinating.  Musculoskeletal: Negative.  Negative for back pain, myalgias and neck pain.  Skin: Negative.  Negative for color change and rash.  Allergic/Immunologic: Negative.   Neurological: Negative.  Negative for dizziness, tremors, weakness and numbness.  Hematological: Negative.  Negative for adenopathy. Does not bruise/bleed easily.  Psychiatric/Behavioral: Negative.     Objective:  BP 130/88 (BP Location: Left Arm, Patient Position: Sitting, Cuff Size: Normal)   Pulse 74   Temp 98 F (36.7 C) (Oral)   Ht '5\' 8"'$  (1.727 m)   Wt 185 lb (83.9 kg)   SpO2 98%   BMI 28.13 kg/m   BP Readings from Last 3 Encounters:  11/09/15 130/88  07/15/15 (!) 150/88  04/14/15 134/80    Wt Readings from Last 3 Encounters:  11/09/15 185 lb (83.9 kg)  07/15/15 187 lb (84.8 kg)  04/14/15 191 lb (86.6 kg)    Physical Exam  Constitutional: She is oriented to person, place, and time. No distress.  HENT:  Mouth/Throat: Oropharynx is clear and moist. No oropharyngeal exudate.  Eyes: Conjunctivae are normal. Right eye exhibits no discharge. Left eye exhibits no discharge. No scleral icterus.  Neck: Normal range of motion. Neck supple. No JVD present. No tracheal deviation present. No thyromegaly present.  Cardiovascular: Normal rate, regular rhythm, normal heart sounds and intact distal pulses.  Exam reveals no gallop and no friction rub.   No murmur heard. Pulmonary/Chest: Effort normal and breath sounds normal. No stridor. No respiratory distress. She has no wheezes. She has no rales. She exhibits no tenderness.  Abdominal: Soft. Bowel  sounds are normal. She exhibits no distension and no mass. There is no tenderness. There is no rebound and no guarding.  Musculoskeletal: Normal range of motion. She exhibits no edema, tenderness or deformity.  Lymphadenopathy:    She has no cervical adenopathy.  Neurological: She is oriented to person, place, and time.  Skin: Skin is warm and dry. No rash noted. She is not diaphoretic. No erythema. No pallor.  Vitals reviewed.   Lab Results  Component Value Date   WBC 5.7 04/14/2015   HGB 13.9 04/14/2015   HCT 41.3 04/14/2015   PLT 246.0 04/14/2015   GLUCOSE 151 (H) 04/14/2015   CHOL 152 04/14/2015   TRIG 203.0 (H) 04/14/2015   HDL 40.80 04/14/2015   LDLDIRECT 77.0 04/14/2015   LDLCALC 89 10/31/2011   ALT 22 04/14/2015   AST 20 04/14/2015   NA 139 04/14/2015   K 4.5 04/14/2015   CL 102 04/14/2015   CREATININE 0.98 04/14/2015   BUN 20 04/14/2015   CO2 30 04/14/2015   TSH 1.14 06/30/2014   INR 1.01 07/19/2013   HGBA1C 7.7 07/15/2015   MICROALBUR 2.0 (H) 04/14/2015    Dg Chest 2 View  Result Date: 07/17/2013 CLINICAL DATA:  Chest pain, history hypertension, diabetes, uterine cancer EXAM: CHEST  2 VIEW COMPARISON:  03/19/2012 FINDINGS: Upper normal heart size. Calcified tortuous aorta. Mediastinal contours and pulmonary vascularity normal. Lungs clear. No pleural effusion or pneumothorax. Scattered endplate spur formation thoracic spine pre IMPRESSION: No acute abnormalities. Electronically Signed   By: Lavonia Dana M.D.   On: 07/17/2013 13:50   Nm Myocar Multi W/spect W/wall Motion / Ef  Result Date: 07/18/2013 CLINICAL DATA:  Chest pain, diabetes, hypertension, hyperlipidemia, and dyspnea on exertion. EXAM: MYOCARDIAL IMAGING WITH SPECT (REST AND PHARMACOLOGIC-STRESS) GATED LEFT VENTRICULAR WALL MOTION STUDY LEFT VENTRICULAR EJECTION FRACTION TECHNIQUE: Standard myocardial SPECT imaging was performed after resting intravenous injection of 10 mCi Tc-65msestamibi. Subsequently,  intravenous infusion of Lexiscan was performed under the supervision of the Cardiology staff. At peak effect of the drug, 30 mCi Tc-954mestamibi was injected intravenously and standard myocardial SPECT imaging was performed. Quantitative gated imaging was also performed to evaluate left ventricular wall motion, and estimate left ventricular ejection fraction. COMPARISON:  Two-view chest x-ray 07/17/2013. FINDINGS: Cardiac uptake is somewhat attenuated along the diaphragm. Uptake is otherwise normal. Contractility is within normal limits. The stress images demonstrate slight decreased uptake within the anterior apical segments suggesting reversible ischemia. Contractility and wall motion is normal. The gated images demonstrate an estimated diastolic volume of 68 mL an estimated systolic volume 15 mL. The calculated ejection fraction is 78%. IMPRESSION: 1. Mild inducible ischemia within the anterior apical segments. 2. Normal contractility and wall motion. 3. Normal function with an estimated ejection fraction of 78%. Electronically Signed   By: ChLawrence Santiago.D.   On: 07/18/2013 13:47    Assessment & Plan:   AmJulietteas seen today for hypertension and diabetes.  Diagnoses and all orders for this visit:  Essential hypertension- her blood pressure is well-controlled, lites and renal  function are stable. 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 Sorter's multivitamin with minerals. I am also having her maintain her Cyanocobalamin (VITAMIN B-12 CR PO), Turmeric, DHEA, CALCIUM-VITAMIN D PO, pramoxine-hydrocortisone, FREESTYLE LITE, nebivolol, losartan, allopurinol, and metFORMIN.  No orders of the defined types were placed in this encounter.    Follow-up: Return in about 6 months (around 05/09/2016).  Scarlette Calico, MD

## 2015-11-09 NOTE — Progress Notes (Signed)
Pre visit review using our clinic review tool, if applicable. No additional management support is needed unless otherwise documented below in the visit note. 

## 2015-11-10 ENCOUNTER — Encounter: Payer: Self-pay | Admitting: Internal Medicine

## 2015-11-13 DIAGNOSIS — H40012 Open angle with borderline findings, low risk, left eye: Secondary | ICD-10-CM | POA: Diagnosis not present

## 2015-11-13 DIAGNOSIS — H40011 Open angle with borderline findings, low risk, right eye: Secondary | ICD-10-CM | POA: Diagnosis not present

## 2015-12-01 LAB — HM DIABETES EYE EXAM

## 2016-01-01 ENCOUNTER — Inpatient Hospital Stay: Admission: RE | Admit: 2016-01-01 | Payer: Medicare Other | Source: Ambulatory Visit

## 2016-01-14 ENCOUNTER — Encounter: Payer: Self-pay | Admitting: Internal Medicine

## 2016-01-14 ENCOUNTER — Ambulatory Visit (INDEPENDENT_AMBULATORY_CARE_PROVIDER_SITE_OTHER): Payer: Medicare Other | Admitting: Internal Medicine

## 2016-01-14 VITALS — BP 154/80 | HR 82 | Temp 97.7°F | Resp 16 | Ht 68.0 in | Wt 189.0 lb

## 2016-01-14 DIAGNOSIS — E118 Type 2 diabetes mellitus with unspecified complications: Secondary | ICD-10-CM

## 2016-01-14 DIAGNOSIS — I10 Essential (primary) hypertension: Secondary | ICD-10-CM

## 2016-01-14 LAB — POCT GLYCOSYLATED HEMOGLOBIN (HGB A1C): Hemoglobin A1C: 7.4

## 2016-01-14 NOTE — Patient Instructions (Signed)

## 2016-01-14 NOTE — Progress Notes (Signed)
Subjective:  Patient ID: Nicole [name], female    DOB: 02/25/1940  Age: 75 y.o. MRN: [id number]  CC: Hypertension and Diabetes   HPI Nicole [name] presents for follow-up on hypertension and diabetes. She feels well and offers no complaints. She tells me her blood pressure and her blood sugars have been well controlled.  Outpatient Medications Prior to Visit  Medication Sig Dispense Refill  . allopurinol (ZYLOPRIM) 100 MG tablet Take 1 tablet (100 mg total) by mouth daily. 90 tablet 3  . CALCIUM-VITAMIN D PO Take by mouth.    . Cyanocobalamin (VITAMIN B-12 CR PO) Take 1 tablet by mouth daily.    [name] DHEA 25 MG CAPS Take 25 mg by mouth 2 (two) times daily.    [name] FREESTYLE LITE test strip TEST once daily as directed 100 each 11  . losartan (COZAAR) 100 MG tablet Take 1 tablet (100 mg total) by mouth daily. 90 tablet 3  . metFORMIN (GLUCOPHAGE-XR) 750 MG 24 hr tablet Take 2 tablets (1,500 mg total) by mouth daily with breakfast. 180 tablet 1  . nebivolol (BYSTOLIC) 10 MG tablet Take 1 tablet (10 mg total) by mouth daily. 119 tablet 0  . pramoxine-hydrocortisone (PROCTOCREAM-HC) 1-1 % rectal cream Place 1 application rectally 3 (three) times daily. 30 g 1  . Turmeric 450 MG CAPS Take 450 mg by mouth 2 (two) times daily.     No facility-administered medications prior to visit.     ROS Review of Systems  Constitutional: Negative for appetite change, diaphoresis, fatigue and unexpected weight change.  HENT: Negative.   Eyes: Negative for visual disturbance.  Respiratory: Negative for cough, chest tightness, shortness of breath and wheezing.   Cardiovascular: Negative.  Negative for chest pain, palpitations and leg swelling.  Gastrointestinal: Negative for abdominal pain, constipation, diarrhea, nausea and vomiting.  Endocrine: Negative for polydipsia, polyphagia and polyuria.  Genitourinary: Negative.  Negative for difficulty urinating and dysuria.  Musculoskeletal: Negative for back  pain and myalgias.  Skin: Negative.  Negative for color change and rash.  Allergic/Immunologic: Negative.   Neurological: Negative.  Negative for dizziness, weakness and light-headedness.  Hematological: Negative.  Negative for adenopathy. Does not bruise/bleed easily.  Psychiatric/Behavioral: Negative.     Objective:  BP (!) 154/80 (BP Location: Left Arm, Patient Position: Sitting, Cuff Size: Normal)   Pulse 82   Temp 97.7 F (36.5 C) (Oral)   Resp 16   Ht '5\' 8"'$  (1.727 m)   Wt 189 lb (85.7 kg)   SpO2 98%   BMI 28.74 kg/m   BP Readings from Last 3 Encounters:  01/14/16 (!) 154/80  11/09/15 130/88  07/15/15 (!) 150/88    Wt Readings from Last 3 Encounters:  01/14/16 189 lb (85.7 kg)  11/09/15 185 lb (83.9 kg)  07/15/15 187 lb (84.8 kg)    Physical Exam  Constitutional: She is oriented to person, place, and time. No distress.  HENT:  Mouth/Throat: Oropharynx is clear and moist. No oropharyngeal exudate.  Eyes: Conjunctivae are normal. Right eye exhibits no discharge. Left eye exhibits no discharge. No scleral icterus.  Neck: Normal range of motion. Neck supple. No JVD present. No tracheal deviation present. No thyromegaly present.  Cardiovascular: Normal rate, regular rhythm and intact distal pulses.  Exam reveals no gallop and no friction rub.   No murmur heard. Pulmonary/Chest: Effort normal and breath sounds normal. No stridor. No respiratory distress. She has no wheezes. She has no rales. She exhibits no tenderness.  Abdominal:  Soft. Bowel sounds are normal. She exhibits no distension and no mass. There is no tenderness. There is no rebound and no guarding.  Musculoskeletal: Normal range of motion. She exhibits no edema, tenderness or deformity.  Lymphadenopathy:    She has no cervical adenopathy.  Neurological: She is oriented to person, place, and time.  Skin: Skin is warm and dry. No rash noted. She is not diaphoretic. No erythema. No pallor.  Vitals  reviewed.   Lab Results  Component Value Date   WBC 6.9 11/09/2015   HGB 13.3 11/09/2015   HCT 38.8 11/09/2015   PLT 278.0 11/09/2015   GLUCOSE 131 (H) 11/09/2015   CHOL 152 04/14/2015   TRIG 203.0 (H) 04/14/2015   HDL 40.80 04/14/2015   LDLDIRECT 77.0 04/14/2015   LDLCALC 89 10/31/2011   ALT 19 11/09/2015   AST 17 11/09/2015   NA 138 11/09/2015   K 4.1 11/09/2015   CL 102 11/09/2015   CREATININE 1.13 11/09/2015   BUN 26 (H) 11/09/2015   CO2 28 11/09/2015   TSH 1.14 06/30/2014   INR 1.01 07/19/2013   HGBA1C 7.4 01/14/2016   MICROALBUR 2.0 (H) 04/14/2015    Dg Chest 2 View  Result Date: 07/17/2013 CLINICAL DATA:  Chest pain, history hypertension, diabetes, uterine cancer EXAM: CHEST  2 VIEW COMPARISON:  03/19/2012 FINDINGS: Upper normal heart size. Calcified tortuous aorta. Mediastinal contours and pulmonary vascularity normal. Lungs clear. No pleural effusion or pneumothorax. Scattered endplate spur formation thoracic spine pre IMPRESSION: No acute abnormalities. Electronically Signed   By: Lavonia Dana M.D.   On: 07/17/2013 13:50   Nm Myocar Multi W/spect W/wall Motion / Ef  Result Date: 07/18/2013 CLINICAL DATA:  Chest pain, diabetes, hypertension, hyperlipidemia, and dyspnea on exertion. EXAM: MYOCARDIAL IMAGING WITH SPECT (REST AND PHARMACOLOGIC-STRESS) GATED LEFT VENTRICULAR WALL MOTION STUDY LEFT VENTRICULAR EJECTION FRACTION TECHNIQUE: Standard myocardial SPECT imaging was performed after resting intravenous injection of 10 mCi Tc-86msestamibi. Subsequently, intravenous infusion of Lexiscan was performed under the supervision of the Cardiology staff. At peak effect of the drug, 30 mCi Tc-952mestamibi was injected intravenously and standard myocardial SPECT imaging was performed. Quantitative gated imaging was also performed to evaluate left ventricular wall motion, and estimate left ventricular ejection fraction. COMPARISON:  Two-view chest x-ray 07/17/2013. FINDINGS:  Cardiac uptake is somewhat attenuated along the diaphragm. Uptake is otherwise normal. Contractility is within normal limits. The stress images demonstrate slight decreased uptake within the anterior apical segments suggesting reversible ischemia. Contractility and wall motion is normal. The gated images demonstrate an estimated diastolic volume of 68 mL an estimated systolic volume 15 mL. The calculated ejection fraction is 78%. IMPRESSION: 1. Mild inducible ischemia within the anterior apical segments. 2. Normal contractility and wall motion. 3. Normal function with an estimated ejection fraction of 78%. Electronically Signed   By: ChLawrence Santiago.D.   On: 07/18/2013 13:47    Assessment & Plan:   AmSerinas seen today for hypertension and diabetes.  Diagnoses and all orders for this visit:  Type 2 diabetes mellitus with complication, without long-term current use of insulin [place] her A1c is 7.4%, her blood sugars are adequately well controlled. Will continue metformin at the current dose. -     POCT glycosylated hemoglobin (Hb A1C)  Essential hypertension- her blood pressure is well-controlled. We'll continue by systolic and losartan.   I am having Ms. LeCallie Fieldingaintain her Cyanocobalamin (VITAMIN B-12 CR PO), Turmeric, DHEA, CALCIUM-VITAMIN D PO, pramoxine-hydrocortisone, FREESTYLE LITE, nebivolol, losartan,  allopurinol, and metFORMIN.  No orders of the defined types were placed in this encounter.    Follow-up: Return in about 6 months (around 07/14/2016).  Scarlette Calico, MD

## 2016-01-14 NOTE — Progress Notes (Signed)
Pre visit review using our clinic review tool, if applicable. No additional management support is needed unless otherwise documented below in the visit note. 

## 2016-03-15 DIAGNOSIS — N959 Unspecified menopausal and perimenopausal disorder: Secondary | ICD-10-CM | POA: Diagnosis not present

## 2016-03-15 DIAGNOSIS — Z01419 Encounter for gynecological examination (general) (routine) without abnormal findings: Secondary | ICD-10-CM | POA: Diagnosis not present

## 2016-03-15 DIAGNOSIS — Z6829 Body mass index (BMI) 29.0-29.9, adult: Secondary | ICD-10-CM | POA: Diagnosis not present

## 2016-03-15 DIAGNOSIS — Z1231 Encounter for screening mammogram for malignant neoplasm of breast: Secondary | ICD-10-CM | POA: Diagnosis not present

## 2016-03-17 LAB — HM MAMMOGRAPHY: HM Mammogram: NORMAL (ref 0–4)

## 2016-03-26 ENCOUNTER — Other Ambulatory Visit: Payer: Self-pay | Admitting: Internal Medicine

## 2016-04-12 ENCOUNTER — Ambulatory Visit (INDEPENDENT_AMBULATORY_CARE_PROVIDER_SITE_OTHER)
Admission: RE | Admit: 2016-04-12 | Discharge: 2016-04-12 | Disposition: A | Discharge status: Home / Self Care | Payer: Medicare Other | Source: Ambulatory Visit | Attending: Nurse Practitioner | Admitting: Nurse Practitioner

## 2016-04-12 ENCOUNTER — Encounter: Payer: Self-pay | Admitting: Nurse Practitioner

## 2016-04-12 ENCOUNTER — Ambulatory Visit (INDEPENDENT_AMBULATORY_CARE_PROVIDER_SITE_OTHER): Payer: Medicare Other | Admitting: Nurse Practitioner

## 2016-04-12 VITALS — BP 128/74 | HR 93 | Temp 98.1°F | Ht 68.0 in | Wt 186.0 lb

## 2016-04-12 DIAGNOSIS — R0602 Shortness of breath: Secondary | ICD-10-CM

## 2016-04-12 DIAGNOSIS — J209 Acute bronchitis, unspecified: Secondary | ICD-10-CM | POA: Diagnosis not present

## 2016-04-12 DIAGNOSIS — R05 Cough: Secondary | ICD-10-CM | POA: Diagnosis not present

## 2016-04-12 MED ORDER — FLUTICASONE PROPIONATE 50 MCG/ACT NA SUSP: 2.0000 | Freq: Every day | NASAL | 0 refills | Status: DC

## 2016-04-12 MED ORDER — DM-GUAIFENESIN ER 30-600 MG PO TB12: 1.0000 | ORAL_TABLET | Freq: Two times a day (BID) | ORAL | 0 refills | Status: DC | PRN

## 2016-04-12 MED ORDER — BENZONATATE 100 MG PO CAPS: 100.0000 mg | ORAL_CAPSULE | Freq: Three times a day (TID) | ORAL | 0 refills | Status: DC | PRN

## 2016-04-12 MED ORDER — ALBUTEROL SULFATE HFA 108 (90 BASE) MCG/ACT IN AERS: 2.0000 | INHALATION_SPRAY | Freq: Four times a day (QID) | RESPIRATORY_TRACT | 0 refills | Status: DC | PRN

## 2016-04-12 NOTE — Patient Instructions (Signed)
Go to basement for CXR. You will be called with results.  Acute Bronchitis, Adult Acute bronchitis is when air tubes (bronchi) in the lungs suddenly get swollen. The condition can make it hard to breathe. It can also cause these symptoms:  A cough.  Coughing up clear, yellow, or green mucus.  Wheezing.  Chest congestion.  Shortness of breath.  A fever.  Body aches.  Chills.  A sore throat. Follow these instructions at home: Medicines   Take over-the-counter and prescription medicines only as told by your doctor.  If you were prescribed an antibiotic medicine, take it as told by your doctor. Do not stop taking the antibiotic even if you start to feel better. General instructions   Rest.  Drink enough fluids to keep your pee (urine) clear or pale yellow.  Avoid smoking and secondhand smoke. If you smoke and you need help quitting, ask your doctor. Quitting will help your lungs heal faster.  Use an inhaler, cool mist vaporizer, or humidifier as told by your doctor.  Keep all follow-up visits as told by your doctor. This is important. How is this prevented? To lower your risk of getting this condition again:  Wash your hands often with soap and water. If you cannot use soap and water, use hand sanitizer.  Avoid contact with people who have cold symptoms.  Try not to touch your hands to your mouth, nose, or eyes.  Make sure to get the flu shot every year. Contact a doctor if:  Your symptoms do not get better in 2 weeks. Get help right away if:  You cough up blood.  You have chest pain.  You have very bad shortness of breath.  You become dehydrated.  You faint (pass out) or keep feeling like you are going to pass out.  You keep throwing up (vomiting).  You have a very bad headache.  Your fever or chills gets worse. This information is not intended to replace advice given to you by your health care provider. Make sure you discuss any questions you have  with your health care provider. Document Released: 06/29/2007 Document Revised: 08/19/2015 Document Reviewed: 07/01/2015 Elsevier Interactive Patient Education  2017 [employer].

## 2016-04-12 NOTE — Progress Notes (Signed)
Subjective:  Patient ID: Nicole [name], female    DOB: 11-21-1940  Age: 76 y.o. MRN: [id number]  CC: Cough (dry cough/had a cold 2 mo ago--went away,left her with dry cough. )   Cough  This is a new problem. The current episode started more than 1 month ago. The problem has been waxing and waning. The problem occurs constantly. The cough is non-productive. Associated symptoms include nasal congestion, postnasal drip, rhinorrhea, shortness of breath and wheezing. Pertinent negatives include no chest pain, chills, fever, headaches, heartburn, hemoptysis, myalgias or sore throat. The symptoms are aggravated by lying down and cold air. She has tried OTC cough suppressant for the symptoms. The treatment provided no relief. Her past medical history is significant for environmental allergies.    Outpatient Medications Prior to Visit  Medication Sig Dispense Refill  . allopurinol (ZYLOPRIM) 100 MG tablet Take 1 tablet (100 mg total) by mouth daily. 90 tablet 3  . CALCIUM-VITAMIN D PO Take by mouth.    . Cyanocobalamin (VITAMIN B-12 CR PO) Take 1 tablet by mouth daily.    [name] FREESTYLE LITE test strip TEST once daily as directed 100 each 11  . losartan (COZAAR) 100 MG tablet Take 1 tablet (100 mg total) by mouth daily. 90 tablet 3  . metFORMIN (GLUCOPHAGE-XR) 750 MG 24 hr tablet Take 2 tablets (1,500 mg total) by mouth daily with breakfast. 180 tablet 1  . nebivolol (BYSTOLIC) 10 MG tablet Take 1 tablet (10 mg total) by mouth daily. 119 tablet 0  . pramoxine-hydrocortisone (PROCTOCREAM-HC) 1-1 % rectal cream Place 1 application rectally 3 (three) times daily. 30 g 1  . Turmeric 450 MG CAPS Take 450 mg by mouth 2 (two) times daily.    [name] DHEA 25 MG CAPS Take 25 mg by mouth 2 (two) times daily.     No facility-administered medications prior to visit.     ROS See HPI  Objective:  BP 128/74   Pulse 93   Temp 98.1 F (36.7 C)   Ht '5\' 8"'$  (1.727 m)   Wt 186 lb (84.4 kg)   SpO2 98%   BMI 28.28  kg/m   BP Readings from Last 3 Encounters:  04/12/16 128/74  01/14/16 (!) 154/80  11/09/15 130/88    Wt Readings from Last 3 Encounters:  04/12/16 186 lb (84.4 kg)  01/14/16 189 lb (85.7 kg)  11/09/15 185 lb (83.9 kg)    Physical Exam  Constitutional: She is oriented to person, place, and time.  HENT:  Right Ear: Tympanic membrane, external ear and ear canal normal.  Left Ear: Tympanic membrane, external ear and ear canal normal.  Nose: Mucosal edema and rhinorrhea present. Right sinus exhibits no maxillary sinus tenderness and no frontal sinus tenderness. Left sinus exhibits no maxillary sinus tenderness and no frontal sinus tenderness.  Mouth/Throat: Uvula is midline. No trismus in the jaw. Posterior oropharyngeal erythema present. No oropharyngeal exudate.  Eyes: No scleral icterus.  Neck: Normal range of motion. Neck supple.  Cardiovascular: Normal rate and normal heart sounds.   Pulmonary/Chest: Effort normal and breath sounds normal.  Musculoskeletal: She exhibits no edema.  Lymphadenopathy:    She has no cervical adenopathy.  Neurological: She is alert and oriented to person, place, and time.  Vitals reviewed.   Lab Results  Component Value Date   WBC 6.9 11/09/2015   HGB 13.3 11/09/2015   HCT 38.8 11/09/2015   PLT 278.0 11/09/2015   GLUCOSE 131 (H) 11/09/2015   CHOL  152 04/14/2015   TRIG 203.0 (H) 04/14/2015   HDL 40.80 04/14/2015   LDLDIRECT 77.0 04/14/2015   LDLCALC 89 10/31/2011   ALT 19 11/09/2015   AST 17 11/09/2015   NA 138 11/09/2015   K 4.1 11/09/2015   CL 102 11/09/2015   CREATININE 1.13 11/09/2015   BUN 26 (H) 11/09/2015   CO2 28 11/09/2015   TSH 1.14 06/30/2014   INR 1.01 07/19/2013   HGBA1C 7.4 01/14/2016   MICROALBUR 2.0 (H) 04/14/2015    Dg Chest 2 View  Result Date: 07/17/2013 CLINICAL DATA:  Chest pain, history hypertension, diabetes, uterine cancer EXAM: CHEST  2 VIEW COMPARISON:  03/19/2012 FINDINGS: Upper normal heart size.  Calcified tortuous aorta. Mediastinal contours and pulmonary vascularity normal. Lungs clear. No pleural effusion or pneumothorax. Scattered endplate spur formation thoracic spine pre IMPRESSION: No acute abnormalities. Electronically Signed   By: Lavonia Dana M.D.   On: 07/17/2013 13:50   Nm Myocar Multi W/spect W/wall Motion / Ef  Result Date: 07/18/2013 CLINICAL DATA:  Chest pain, diabetes, hypertension, hyperlipidemia, and dyspnea on exertion. EXAM: MYOCARDIAL IMAGING WITH SPECT (REST AND PHARMACOLOGIC-STRESS) GATED LEFT VENTRICULAR WALL MOTION STUDY LEFT VENTRICULAR EJECTION FRACTION TECHNIQUE: Standard myocardial SPECT imaging was performed after resting intravenous injection of 10 mCi Tc-19msestamibi. Subsequently, intravenous infusion of Lexiscan was performed under the supervision of the Cardiology staff. At peak effect of the drug, 30 mCi Tc-91mestamibi was injected intravenously and standard myocardial SPECT imaging was performed. Quantitative gated imaging was also performed to evaluate left ventricular wall motion, and estimate left ventricular ejection fraction. COMPARISON:  Two-view chest x-ray 07/17/2013. FINDINGS: Cardiac uptake is somewhat attenuated along the diaphragm. Uptake is otherwise normal. Contractility is within normal limits. The stress images demonstrate slight decreased uptake within the anterior apical segments suggesting reversible ischemia. Contractility and wall motion is normal. The gated images demonstrate an estimated diastolic volume of 68 mL an estimated systolic volume 15 mL. The calculated ejection fraction is 78%. IMPRESSION: 1. Mild inducible ischemia within the anterior apical segments. 2. Normal contractility and wall motion. 3. Normal function with an estimated ejection fraction of 78%. Electronically Signed   By: ChLawrence Santiago.D.   On: 07/18/2013 13:47    Assessment & Plan:   AmEulalaas seen today for cough.  Diagnoses and all orders for this  visit:  Acute bronchitis, unspecified organism -     DG Chest 2 View; Future -     benzonatate (TESSALON) 100 MG capsule; Take 1 capsule (100 mg total) by mouth 3 (three) times daily as needed for cough. -     dextromethorphan-guaiFENesin (MUCINEX DM) 30-600 MG 12hr tablet; Take 1 tablet by mouth 2 (two) times daily as needed for cough. -     fluticasone (FLONASE) 50 MCG/ACT nasal spray; Place 2 sprays into both nostrils daily. -     albuterol (PROVENTIL HFA;VENTOLIN HFA) 108 (90 Base) MCG/ACT inhaler; Inhale 2 puffs into the lungs every 6 (six) hours as needed for wheezing or shortness of breath. -     mometasone-formoterol (DULERA) 100-5 MCG/ACT AERO; Inhale 2 puffs into the lungs 2 times daily at 12 noon and 4 pm.  SOB (shortness of breath) -     DG Chest 2 View; Future -     benzonatate (TESSALON) 100 MG capsule; Take 1 capsule (100 mg total) by mouth 3 (three) times daily as needed for cough. -     dextromethorphan-guaiFENesin (MUCINEX DM) 30-600 MG 12hr tablet; Take 1  tablet by mouth 2 (two) times daily as needed for cough. -     fluticasone (FLONASE) 50 MCG/ACT nasal spray; Place 2 sprays into both nostrils daily. -     albuterol (PROVENTIL HFA;VENTOLIN HFA) 108 (90 Base) MCG/ACT inhaler; Inhale 2 puffs into the lungs every 6 (six) hours as needed for wheezing or shortness of breath. -     mometasone-formoterol (DULERA) 100-5 MCG/ACT AERO; Inhale 2 puffs into the lungs 2 times daily at 12 noon and 4 pm.   I am having Ms. Callie Fielding start on benzonatate, dextromethorphan-guaiFENesin, fluticasone, albuterol, and mometasone-formoterol. I am also having her maintain her Cyanocobalamin (VITAMIN B-12 CR PO), Turmeric, DHEA, CALCIUM-VITAMIN D PO, pramoxine-hydrocortisone, nebivolol, losartan, allopurinol, metFORMIN, and FREESTYLE LITE.  Meds ordered this encounter  Medications  . benzonatate (TESSALON) 100 MG capsule    Sig: Take 1 capsule (100 mg total) by mouth 3 (three) times daily as needed  for cough.    Dispense:  20 capsule    Refill:  0    Order Specific Question:   Supervising Provider    Answer:   Cassandria Anger [[id number]]  . dextromethorphan-guaiFENesin (MUCINEX DM) 30-600 MG 12hr tablet    Sig: Take 1 tablet by mouth 2 (two) times daily as needed for cough.    Dispense:  14 tablet    Refill:  0    Order Specific Question:   Supervising Provider    Answer:   Cassandria Anger [[id number]]  . fluticasone (FLONASE) 50 MCG/ACT nasal spray    Sig: Place 2 sprays into both nostrils daily.    Dispense:  16 g    Refill:  0    Order Specific Question:   Supervising Provider    Answer:   Cassandria Anger [[id number]]  . albuterol (PROVENTIL HFA;VENTOLIN HFA) 108 (90 Base) MCG/ACT inhaler    Sig: Inhale 2 puffs into the lungs every 6 (six) hours as needed for wheezing or shortness of breath.    Dispense:  1 Inhaler    Refill:  0    Order Specific Question:   Supervising Provider    Answer:   Cassandria Anger [[id number]]  . mometasone-formoterol (DULERA) 100-5 MCG/ACT AERO    Sig: Inhale 2 puffs into the lungs 2 times daily at 12 noon and 4 pm.    Dispense:  1 Inhaler    Refill:  1    Order Specific Question:   Supervising Provider    Answer:   Cassandria Anger [[id number]]    Follow-up: No Follow-up on file.  Wilfred Lacy, NP

## 2016-04-12 NOTE — Progress Notes (Signed)
Pre visit review using our clinic review tool, if applicable. No additional management support is needed unless otherwise documented below in the visit note. 

## 2016-04-13 MED ORDER — MOMETASONE FURO-FORMOTEROL FUM 100-5 MCG/ACT IN AERO: 2.0000 | INHALATION_SPRAY | Freq: Two times a day (BID) | RESPIRATORY_TRACT | 1 refills | Status: DC

## 2016-04-26 ENCOUNTER — Other Ambulatory Visit: Payer: Self-pay | Admitting: Internal Medicine

## 2016-04-26 DIAGNOSIS — E118 Type 2 diabetes mellitus with unspecified complications: Secondary | ICD-10-CM

## 2016-06-01 ENCOUNTER — Telehealth: Payer: Self-pay | Admitting: Internal Medicine

## 2016-06-01 ENCOUNTER — Other Ambulatory Visit: Payer: Self-pay | Admitting: Internal Medicine

## 2016-06-01 DIAGNOSIS — K862 Cyst of pancreas: Secondary | ICD-10-CM

## 2016-06-01 NOTE — Telephone Encounter (Signed)
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

## 2016-06-14 ENCOUNTER — Ambulatory Visit (INDEPENDENT_AMBULATORY_CARE_PROVIDER_SITE_OTHER): Payer: Medicare Other | Admitting: Internal Medicine

## 2016-06-14 ENCOUNTER — Other Ambulatory Visit (INDEPENDENT_AMBULATORY_CARE_PROVIDER_SITE_OTHER): Payer: Medicare Other

## 2016-06-14 ENCOUNTER — Encounter: Payer: Self-pay | Admitting: Internal Medicine

## 2016-06-14 VITALS — BP 142/78 | HR 77 | Temp 98.0°F | Resp 16 | Ht 68.0 in | Wt 184.2 lb

## 2016-06-14 DIAGNOSIS — Z0001 Encounter for general adult medical examination with abnormal findings: Secondary | ICD-10-CM

## 2016-06-14 DIAGNOSIS — E781 Pure hyperglyceridemia: Secondary | ICD-10-CM | POA: Diagnosis not present

## 2016-06-14 DIAGNOSIS — Z0000 Encounter for general adult medical examination without abnormal findings: Secondary | ICD-10-CM

## 2016-06-14 DIAGNOSIS — R7989 Other specified abnormal findings of blood chemistry: Secondary | ICD-10-CM | POA: Diagnosis not present

## 2016-06-14 DIAGNOSIS — E118 Type 2 diabetes mellitus with unspecified complications: Secondary | ICD-10-CM

## 2016-06-14 DIAGNOSIS — E785 Hyperlipidemia, unspecified: Secondary | ICD-10-CM

## 2016-06-14 DIAGNOSIS — I10 Essential (primary) hypertension: Secondary | ICD-10-CM

## 2016-06-14 DIAGNOSIS — M1000 Idiopathic gout, unspecified site: Secondary | ICD-10-CM

## 2016-06-14 LAB — COMPREHENSIVE METABOLIC PANEL
ALT: 25 U/L (ref 0–35)
AST: 22 U/L (ref 0–37)
Albumin: 4.2 g/dL (ref 3.5–5.2)
Alkaline Phosphatase: 56 U/L (ref 39–117)
BUN: 24 mg/dL — ABNORMAL HIGH (ref 6–23)
CO2: 29 mEq/L (ref 19–32)
Calcium: 10 mg/dL (ref 8.4–10.5)
Chloride: 104 mEq/L (ref 96–112)
Creatinine, Ser: 0.99 mg/dL (ref 0.40–1.20)
GFR: 58.34 mL/min — ABNORMAL LOW (ref >60.00)
Glucose, Bld: 133 mg/dL — ABNORMAL HIGH (ref 70–99)
Potassium: 4.1 mEq/L (ref 3.5–5.1)
Sodium: 140 mEq/L (ref 135–145)
Total Bilirubin: 0.6 mg/dL (ref 0.2–1.2)
Total Protein: 7.8 g/dL (ref 6.0–8.3)

## 2016-06-14 LAB — URINALYSIS, ROUTINE W REFLEX MICROSCOPIC
Bilirubin Urine: NEGATIVE
Hgb urine dipstick: NEGATIVE
Ketones, ur: NEGATIVE
Nitrite: NEGATIVE
Specific Gravity, Urine: 1.025 (ref 1.000–1.030)
Total Protein, Urine: NEGATIVE
Urine Glucose: NEGATIVE
Urobilinogen, UA: 0.2 (ref 0.0–1.0)
pH: 6 (ref 5.0–8.0)

## 2016-06-14 LAB — MICROALBUMIN / CREATININE URINE RATIO
Creatinine,U: 116.1 mg/dL
Microalb Creat Ratio: 3.4 mg/g (ref 0.0–30.0)
Microalb, Ur: 4 mg/dL — ABNORMAL HIGH (ref 0.0–1.9)

## 2016-06-14 LAB — LDL CHOLESTEROL, DIRECT: Direct LDL: 74 mg/dL

## 2016-06-14 LAB — LIPID PANEL
Cholesterol: 176 mg/dL (ref 0–200)
HDL: 40.7 mg/dL (ref >39.00)
Total CHOL/HDL Ratio: 4
Triglycerides: 460 mg/dL — ABNORMAL HIGH (ref 0.0–149.0)

## 2016-06-14 LAB — HEMOGLOBIN A1C: Hgb A1c MFr Bld: 8.1 % — ABNORMAL HIGH (ref 4.6–6.5)

## 2016-06-14 LAB — URIC ACID: Uric Acid, Serum: 3.5 mg/dL (ref 2.4–7.0)

## 2016-06-14 NOTE — Patient Instructions (Signed)
Health Maintenance, Female Adopting a healthy lifestyle and getting preventive care can go a long way to promote health and wellness. Talk with your health care provider about what schedule of regular examinations is right for you. This is a good chance for you to check in with your provider about disease prevention and staying healthy. In between checkups, there are plenty of things you can do on your own. Experts have done a lot of research about which lifestyle changes and preventive measures are most likely to keep you healthy. Ask your health care provider for more information. Weight and diet Eat a healthy diet  Be sure to include plenty of vegetables, fruits, low-fat dairy products, and lean protein.  Do not eat a lot of foods high in solid fats, added sugars, or salt.  Get regular exercise. This is one of the most important things you can do for your health.  Most adults should exercise for at least 150 minutes each week. The exercise should increase your heart rate and make you sweat (moderate-intensity exercise).  Most adults should also do strengthening exercises at least twice a week. This is in addition to the moderate-intensity exercise. Maintain a healthy weight  Body mass index (BMI) is a measurement that can be used to identify possible weight problems. It estimates body fat based on height and weight. Your health care provider can help determine your BMI and help you achieve or maintain a healthy weight.  For females 76 years of age and older:  A BMI below 18.5 is considered underweight.  A BMI of 18.5 to 24.9 is normal.  A BMI of 25 to 29.9 is considered overweight.  A BMI of 30 and above is considered obese. Watch levels of cholesterol and blood lipids  You should start having your blood tested for lipids and cholesterol at 74 years of age, then have this test every 5 years.  You may need to have your cholesterol levels checked more often if:  Your lipid or  cholesterol levels are high.  You are older than 74 years of age.  You are at high risk for heart disease. Cancer screening Lung Cancer  Lung cancer screening is recommended for adults 64-42 years old who are at high risk for lung cancer because of a history of smoking.  A yearly low-dose CT scan of the lungs is recommended for people who:  Currently smoke.  Have quit within the past 15 years.  Have at least a 30-pack-year history of smoking. A pack year is smoking an average of one pack of cigarettes a day for 1 year.  Yearly screening should continue until it has been 15 years since you quit.  Yearly screening should stop if you develop a health problem that would prevent you from having lung cancer treatment. Breast Cancer  Practice breast self-awareness. This means understanding how your breasts normally appear and feel.  It also means doing regular breast self-exams. Let your health care provider know about any changes, no matter how small.  If you are in your 20s or 30s, you should have a clinical breast exam (CBE) by a health care provider every 1-3 years as part of a regular health exam.  If you are 34 or older, have a CBE every year. Also consider having a breast X-ray (mammogram) every year.  If you have a family history of breast cancer, talk to your health care provider about genetic screening.  If you are at high risk for breast cancer, talk  to your health care provider about having an MRI and a mammogram every year.  Breast cancer gene (BRCA) assessment is recommended for women who have family members with BRCA-related cancers. BRCA-related cancers include:  Breast.  Ovarian.  Tubal.  Peritoneal cancers.  Results of the assessment will determine the need for genetic counseling and BRCA1 and BRCA2 testing. Cervical Cancer  Your health care provider may recommend that you be screened regularly for cancer of the pelvic organs (ovaries, uterus, and vagina).  This screening involves a pelvic examination, including checking for microscopic changes to the surface of your cervix (Pap test). You may be encouraged to have this screening done every 3 years, beginning at age 24.  For women ages 66-65, health care providers may recommend pelvic exams and Pap testing every 3 years, or they may recommend the Pap and pelvic exam, combined with testing for human papilloma virus (HPV), every 5 years. Some types of HPV increase your risk of cervical cancer. Testing for HPV may also be done on women of any age with unclear Pap test results.  Other health care providers may not recommend any screening for nonpregnant women who are considered low risk for pelvic cancer and who do not have symptoms. Ask your health care provider if a screening pelvic exam is right for you.  If you have had past treatment for cervical cancer or a condition that could lead to cancer, you need Pap tests and screening for cancer for at least 20 years after your treatment. If Pap tests have been discontinued, your risk factors (such as having a new sexual partner) need to be reassessed to determine if screening should resume. Some women have medical problems that increase the chance of getting cervical cancer. In these cases, your health care provider may recommend more frequent screening and Pap tests. Colorectal Cancer  This type of cancer can be detected and often prevented.  Routine colorectal cancer screening usually begins at 74 years of age and continues through 74 years of age.  Your health care provider may recommend screening at an earlier age if you have risk factors for colon cancer.  Your health care provider may also recommend using home test kits to check for hidden blood in the stool.  A small camera at the end of a tube can be used to examine your colon directly (sigmoidoscopy or colonoscopy). This is done to check for the earliest forms of colorectal cancer.  Routine  screening usually begins at age 41.  Direct examination of the colon should be repeated every 5-10 years through 74 years of age. However, you may need to be screened more often if early forms of precancerous polyps or small growths are found. Skin Cancer  Check your skin from head to toe regularly.  Tell your health care provider about any new moles or changes in moles, especially if there is a change in a mole's shape or color.  Also tell your health care provider if you have a mole that is larger than the size of a pencil eraser.  Always use sunscreen. Apply sunscreen liberally and repeatedly throughout the day.  Protect yourself by wearing long sleeves, pants, a wide-brimmed hat, and sunglasses whenever you are outside. Heart disease, diabetes, and high blood pressure  High blood pressure causes heart disease and increases the risk of stroke. High blood pressure is more likely to develop in:  People who have blood pressure in the high end of the normal range (130-139/85-89 mm Hg).  People who are overweight or obese.  People who are African American.  If you are 59-24 years of age, have your blood pressure checked every 3-5 years. If you are 34 years of age or older, have your blood pressure checked every year. You should have your blood pressure measured twice-once when you are at a hospital or clinic, and once when you are not at a hospital or clinic. Record the average of the two measurements. To check your blood pressure when you are not at a hospital or clinic, you can use:  An automated blood pressure machine at a pharmacy.  A home blood pressure monitor.  If you are between 29 years and 60 years old, ask your health care provider if you should take aspirin to prevent strokes.  Have regular diabetes screenings. This involves taking a blood sample to check your fasting blood sugar level.  If you are at a normal weight and have a low risk for diabetes, have this test once  every three years after 74 years of age.  If you are overweight and have a high risk for diabetes, consider being tested at a younger age or more often. Preventing infection Hepatitis B  If you have a higher risk for hepatitis B, you should be screened for this virus. You are considered at high risk for hepatitis B if:  You were born in a country where hepatitis B is common. Ask your health care provider which countries are considered high risk.  Your parents were born in a high-risk country, and you have not been immunized against hepatitis B (hepatitis B vaccine).  You have HIV or AIDS.  You use needles to inject street drugs.  You live with someone who has hepatitis B.  You have had sex with someone who has hepatitis B.  You get hemodialysis treatment.  You take certain medicines for conditions, including cancer, organ transplantation, and autoimmune conditions. Hepatitis C  Blood testing is recommended for:  Everyone born from [birth year] through 1963.  Anyone with known risk factors for hepatitis C. Sexually transmitted infections (STIs)  You should be screened for sexually transmitted infections (STIs) including gonorrhea and chlamydia if:  You are sexually active and are younger than 74 years of age.  You are older than 74 years of age and your health care provider tells you that you are at risk for this type of infection.  Your sexual activity has changed since you were last screened and you are at an increased risk for chlamydia or gonorrhea. Ask your health care provider if you are at risk.  If you do not have HIV, but are at risk, it may be recommended that you take a prescription medicine daily to prevent HIV infection. This is called pre-exposure prophylaxis (PrEP). You are considered at risk if:  You are sexually active and do not regularly use condoms or know the HIV status of your partner(s).  You take drugs by injection.  You are sexually active with a partner  who has HIV. Talk with your health care provider about whether you are at high risk of being infected with HIV. If you choose to begin PrEP, you should first be tested for HIV. You should then be tested every 3 months for as long as you are taking PrEP. Pregnancy  If you are premenopausal and you may become pregnant, ask your health care provider about preconception counseling.  If you may become pregnant, take 400 to 800 micrograms (mcg) of folic acid  every day.  If you want to prevent pregnancy, talk to your health care provider about birth control (contraception). Osteoporosis and menopause  Osteoporosis is a disease in which the bones lose minerals and strength with aging. This can result in serious bone fractures. Your risk for osteoporosis can be identified using a bone density scan.  If you are 4 years of age or older, or if you are at risk for osteoporosis and fractures, ask your health care provider if you should be screened.  Ask your health care provider whether you should take a calcium or vitamin D supplement to lower your risk for osteoporosis.  Menopause may have certain physical symptoms and risks.  Hormone replacement therapy may reduce some of these symptoms and risks. Talk to your health care provider about whether hormone replacement therapy is right for you. Follow these instructions at home:  Schedule regular health, dental, and eye exams.  Stay current with your immunizations.  Do not use any tobacco products including cigarettes, chewing tobacco, or electronic cigarettes.  If you are pregnant, do not drink alcohol.  If you are breastfeeding, limit how much and how often you drink alcohol.  Limit alcohol intake to no more than 1 drink per day for nonpregnant women. One drink equals 12 ounces of beer, 5 ounces of wine, or 1 ounces of hard liquor.  Do not use street drugs.  Do not share needles.  Ask your health care provider for help if you need support  or information about quitting drugs.  Tell your health care provider if you often feel depressed.  Tell your health care provider if you have ever been abused or do not feel safe at home. This information is not intended to replace advice given to you by your health care provider. Make sure you discuss any questions you have with your health care provider. Document Released: 07/26/2010 Document Revised: 06/18/2015 Document Reviewed: 10/14/2014 Elsevier Interactive Patient Education  2017 [employer].

## 2016-06-14 NOTE — Progress Notes (Signed)
Subjective:  Patient ID: Nicole [name], female    DOB: 10/20/1940  Age: 76 y.o. MRN: [id number]  CC: Annual Exam; Hypertension; Hyperlipidemia; and Diabetes   HPI Nicole [name] presents for an AWV and f/up.  She has been under a lot of stress recently with an ill husband who is suffering from gastric cancer and she is not taking very good care of herself. She has had quite a few dietary indiscretions but fortunately, she has not gained weight. She has had a few episodes of right, intermittent flank pain but denies polys, chest pain, shortness of breath, edema, diaphoresis, or fatigue. 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 His had no recent episodes of abdominal pain.  Past Medical History:  Diagnosis Date  . Bronchitis    hx of  . 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 Diabetes mellitus    type 2  . Hypertension   . Low back pain   . Osteoarthritis    Past Surgical History:  Procedure Laterality Date  . ABDOMINAL HYSTERECTOMY  2004  . APPENDECTOMY  when 76 years old  . COLONOSCOPY  08-05-09   [name]  . INCONTINENCE SURGERY    . KNEE ARTHROSCOPY Left   . LEFT HEART CATHETERIZATION WITH CORONARY ANGIOGRAM N/A 07/19/2013   Procedure: LEFT HEART CATHETERIZATION WITH CORONARY ANGIOGRAM;  Surgeon: Sinclair Grooms, MD;  Location: [facility] CATH LAB;  Service: Cardiovascular;  Laterality: N/A;  . POLYPECTOMY  08-05-09   2 polyps  . TOTAL KNEE ARTHROPLASTY Left 03/28/2012   Procedure: LEFT TOTAL KNEE ARTHROPLASTY;  Surgeon: Tobi Bastos, MD;  Location: [facility] ORS;  Service: Orthopedics;  Laterality: Left;  . TUBAL LIGATION      reports that she has never smoked. She has never used smokeless tobacco. She reports that she does not drink alcohol or use drugs. family history includes Arthritis in her other; Cancer in her other; Diabetes in her other; Heart attack in her father; Heart disease in her father;  Hyperlipidemia in her other; Hypertension in her other. No Known Allergies  Outpatient Medications Prior to Visit  Medication Sig Dispense Refill  . allopurinol (ZYLOPRIM) 100 MG tablet Take 1 tablet (100 mg total) by mouth daily. 90 tablet 3  . CALCIUM-VITAMIN D PO Take by mouth.    . Cyanocobalamin (VITAMIN B-12 CR PO) Take 1 tablet by mouth daily.    [name] DHEA 25 MG CAPS Take 25 mg by mouth 2 (two) times daily.    [name] FREESTYLE LITE test strip TEST once daily as directed 100 each 11  . losartan (COZAAR) 100 MG tablet Take 1 tablet (100 mg total) by mouth daily. 90 tablet 3  . metFORMIN (GLUCOPHAGE-XR) 750 MG 24 hr tablet take 2 tablets by mouth once daily with BREAKFAST 180 tablet 1  . mometasone-formoterol (DULERA) 100-5 MCG/ACT AERO Inhale 2 puffs into the lungs 2 times daily at 12 noon and 4 pm. 1 Inhaler 1  . nebivolol (BYSTOLIC) 10 MG tablet Take 1 tablet (10 mg total) by mouth daily. 119 tablet 0  . Turmeric 450 MG CAPS Take 450 mg by mouth 2 (two) times daily.    [name] albuterol (PROVENTIL HFA;VENTOLIN HFA) 108 (90 Base) MCG/ACT inhaler Inhale 2 puffs into the lungs every 6 (six) hours as needed for wheezing or shortness of breath. 1 Inhaler 0  . benzonatate (TESSALON) 100 MG capsule Take 1  capsule (100 mg total) by mouth 3 (three) times daily as needed for cough. 20 capsule 0  . dextromethorphan-guaiFENesin (MUCINEX DM) 30-600 MG 12hr tablet Take 1 tablet by mouth 2 (two) times daily as needed for cough. 14 tablet 0  . fluticasone (FLONASE) 50 MCG/ACT nasal spray Place 2 sprays into both nostrils daily. 16 g 0  . pramoxine-hydrocortisone (PROCTOCREAM-HC) 1-1 % rectal cream Place 1 application rectally 3 (three) times daily. 30 g 1   No facility-administered medications prior to visit.     ROS Review of Systems  Constitutional: Negative for activity change, chills, diaphoresis, fatigue and unexpected weight change.  HENT: Negative.  Negative for trouble swallowing.   Eyes: Negative for  visual disturbance.  Respiratory: Negative for cough, chest tightness, shortness of breath and wheezing.   Cardiovascular: Negative for chest pain, palpitations and leg swelling.  Gastrointestinal: Negative for abdominal pain, blood in stool, constipation, diarrhea, nausea and vomiting.  Endocrine: Negative.  Negative for cold intolerance, heat intolerance, polydipsia, polyphagia and polyuria.  Genitourinary: Positive for flank pain. Negative for decreased urine volume, difficulty urinating, dysuria, frequency, hematuria and urgency.  Musculoskeletal: Negative for back pain and myalgias.  Skin: Negative.   Allergic/Immunologic: Negative.   Neurological: Negative.  Negative for dizziness, weakness and headaches.  Hematological: Negative.  Negative for adenopathy. Does not bruise/bleed easily.  Psychiatric/Behavioral: Negative.     Objective:  BP (!) 142/78 (BP Location: Left Arm, Patient Position: Sitting, Cuff Size: Normal)   Pulse 77   Temp 98 F (36.7 C) (Oral)   Resp 16   Ht '5\' 8"'$  (1.727 m)   Wt 184 lb 4 oz (83.6 kg)   SpO2 98%   BMI 28.02 kg/m   BP Readings from Last 3 Encounters:  06/14/16 (!) 142/78  04/12/16 128/74  01/14/16 (!) 154/80    Wt Readings from Last 3 Encounters:  06/14/16 184 lb 4 oz (83.6 kg)  04/12/16 186 lb (84.4 kg)  01/14/16 189 lb (85.7 kg)    Physical Exam  Constitutional: She is oriented to person, place, and time. No distress.  HENT:  Mouth/Throat: Oropharynx is clear and moist. No oropharyngeal exudate.  Eyes: Conjunctivae are normal. Right eye exhibits no discharge. Left eye exhibits no discharge. No scleral icterus.  Neck: Normal range of motion. Neck supple. No JVD present. No tracheal deviation present. No thyromegaly present.  Cardiovascular: Normal rate, regular rhythm, normal heart sounds and intact distal pulses.  Exam reveals no gallop and no friction rub.   No murmur heard. Pulmonary/Chest: Effort normal and breath sounds normal. No  stridor. No respiratory distress. She has no wheezes. She has no rales. She exhibits no tenderness.  Abdominal: Soft. Bowel sounds are normal. She exhibits no distension and no mass. There is no tenderness. There is no rebound and no guarding.  Musculoskeletal: Normal range of motion. She exhibits no edema, tenderness or deformity.  Lymphadenopathy:    She has no cervical adenopathy.  Neurological: She is oriented to person, place, and time.  Skin: Skin is warm and dry. No rash noted. She is not diaphoretic. No erythema. No pallor.  Vitals reviewed.   Lab Results  Component Value Date   WBC 6.9 11/09/2015   HGB 13.3 11/09/2015   HCT 38.8 11/09/2015   PLT 278.0 11/09/2015   GLUCOSE 133 (H) 06/14/2016   CHOL 176 06/14/2016   TRIG (H) 06/14/2016    460.0 Triglyceride is over 400; calculations on Lipids are invalid.   HDL 40.70 06/14/2016  LDLDIRECT 74.0 06/14/2016   LDLCALC 89 10/31/2011   ALT 25 06/14/2016   AST 22 06/14/2016   NA 140 06/14/2016   K 4.1 06/14/2016   CL 104 06/14/2016   CREATININE 0.99 06/14/2016   BUN 24 (H) 06/14/2016   CO2 29 06/14/2016   TSH 0.81 06/14/2016   INR 1.01 07/19/2013   HGBA1C 8.1 (H) 06/14/2016   MICROALBUR 4.0 (H) 06/14/2016    Dg Chest 2 View  Result Date: 04/12/2016 CLINICAL DATA:  Dry cough and shortness of breath for about 2 weeks. EXAM: CHEST  2 VIEW COMPARISON:  07/17/2013 FINDINGS: Both lungs are clear. Heart and mediastinum are within normal limits. Atherosclerotic calcifications at the aortic arch. Trachea is midline. No pleural effusions. Degenerative endplate changes in the spine. IMPRESSION: No active cardiopulmonary disease. Aortic atherosclerosis. Electronically Signed   By: Markus Daft M.D.   On: 04/12/2016 17:31    Assessment & Plan:   Nicole [name] was seen today for annual exam, hypertension, hyperlipidemia and diabetes.  Diagnoses and all orders for this visit:  Essential hypertension- her blood pressure is adequately well  controlled, electrolytes and renal function are normal. -     Comprehensive metabolic panel; Future -     Urinalysis, Routine w reflex microscopic; Future -     TSH; Future  Type 2 diabetes mellitus with complication, without long-term current use of insulin ([place])- her A1c is up to 8.1% but rather than add an additional medication she wants to work on her lifestyle modifications. For now will continue metformin at the current dose. -     Comprehensive metabolic panel; Future -     Hemoglobin A1c; Future -     Microalbumin / creatinine urine ratio; Future  Hyperlipidemia with target LDL less than 100- she has achieved her LDL goal and is not willing to take a statin. -     Lipid panel; Future  Idiopathic gout, unspecified chronicity, unspecified site- she has achieved her uric acid goal of less than 6, will continue allopurinol at the current dose. -     Uric acid; Future  Routine general medical examination at a health care facility  Hypertriglyceridemia- her triglycerides are elevated at 460, I've asked her to start taking and omega-3 fish oil to avoid complications such as pancreatitis. She will also work on her lifestyle modifications including diet and weight loss. -     omega-3 acid ethyl esters (LOVAZA) 1 g capsule; Take 2 capsules (2 g total) by mouth 2 (two) times daily.   I have discontinued Ms. Broden's pramoxine-hydrocortisone, benzonatate, dextromethorphan-guaiFENesin, fluticasone, and albuterol. I am also having her start on omega-3 acid ethyl esters. Additionally, I am having her maintain her Cyanocobalamin (VITAMIN B-12 CR PO), Turmeric, DHEA, CALCIUM-VITAMIN D PO, nebivolol, losartan, allopurinol, FREESTYLE LITE, mometasone-formoterol, and metFORMIN.  Meds ordered this encounter  Medications  . omega-3 acid ethyl esters (LOVAZA) 1 g capsule    Sig: Take 2 capsules (2 g total) by mouth 2 (two) times daily.    Dispense:  120 capsule    Refill:  11   See AVS for  instructions about healthy living and anticipatory guidance.   Follow-up: Return in about 6 months (around 12/15/2016).  Scarlette Calico, MD

## 2016-06-15 ENCOUNTER — Encounter: Payer: Self-pay | Admitting: Internal Medicine

## 2016-06-15 LAB — TSH: TSH: 0.81 u[IU]/mL (ref 0.35–4.50)

## 2016-06-15 MED ORDER — OMEGA-3-ACID ETHYL ESTERS 1 G PO CAPS: 2.0000 g | ORAL_CAPSULE | Freq: Two times a day (BID) | ORAL | 11 refills | Status: DC

## 2016-06-15 NOTE — Assessment & Plan Note (Signed)

## 2016-06-16 ENCOUNTER — Ambulatory Visit
Admission: RE | Admit: 2016-06-16 | Discharge: 2016-06-16 | Disposition: A | Discharge status: Home / Self Care | Payer: Medicare Other | Source: Ambulatory Visit | Attending: Internal Medicine | Admitting: Internal Medicine

## 2016-06-16 DIAGNOSIS — K862 Cyst of pancreas: Secondary | ICD-10-CM

## 2016-06-16 MED ORDER — GADOBENATE DIMEGLUMINE 529 MG/ML IV SOLN
16.0000 mL | Freq: Once | INTRAVENOUS | Status: AC | PRN
  Administered 2016-06-16: 16 mL via INTRAVENOUS

## 2016-06-19 ENCOUNTER — Encounter: Payer: Self-pay | Admitting: Internal Medicine

## 2016-06-19 ENCOUNTER — Other Ambulatory Visit: Payer: Self-pay | Admitting: Internal Medicine

## 2016-06-19 DIAGNOSIS — R918 Other nonspecific abnormal finding of lung field: Secondary | ICD-10-CM

## 2016-06-19 DIAGNOSIS — E118 Type 2 diabetes mellitus with unspecified complications: Secondary | ICD-10-CM

## 2016-06-19 DIAGNOSIS — I10 Essential (primary) hypertension: Secondary | ICD-10-CM

## 2016-06-28 ENCOUNTER — Ambulatory Visit (INDEPENDENT_AMBULATORY_CARE_PROVIDER_SITE_OTHER)
Admission: RE | Admit: 2016-06-28 | Discharge: 2016-06-28 | Disposition: A | Discharge status: Home / Self Care | Payer: Medicare Other | Source: Ambulatory Visit | Attending: Internal Medicine | Admitting: Internal Medicine

## 2016-06-28 ENCOUNTER — Other Ambulatory Visit: Payer: Self-pay | Admitting: Internal Medicine

## 2016-06-28 ENCOUNTER — Encounter: Payer: Self-pay | Admitting: Internal Medicine

## 2016-06-28 DIAGNOSIS — R918 Other nonspecific abnormal finding of lung field: Secondary | ICD-10-CM

## 2016-06-30 ENCOUNTER — Telehealth: Payer: Self-pay | Admitting: Internal Medicine

## 2016-06-30 NOTE — Telephone Encounter (Signed)
Spoke with pt. Made her aware that we do not have any new patient appointments before the date she is currently scheduled for. She verbalized understanding. Pt will call every so often to see if someone has canceled. Nothing further was needed.

## 2016-07-01 ENCOUNTER — Other Ambulatory Visit: Payer: Self-pay

## 2016-07-01 DIAGNOSIS — R918 Other nonspecific abnormal finding of lung field: Secondary | ICD-10-CM

## 2016-07-05 ENCOUNTER — Ambulatory Visit
Admission: RE | Admit: 2016-07-05 | Discharge: 2016-07-05 | Disposition: A | Discharge status: Home / Self Care | Payer: Medicare Other | Source: Ambulatory Visit | Attending: Pulmonary Disease | Admitting: Pulmonary Disease

## 2016-07-05 DIAGNOSIS — R918 Other nonspecific abnormal finding of lung field: Secondary | ICD-10-CM

## 2016-07-05 DIAGNOSIS — J984 Other disorders of lung: Secondary | ICD-10-CM | POA: Diagnosis not present

## 2016-07-05 LAB — GLUCOSE, CAPILLARY: Glucose-Capillary: 151 mg/dL — ABNORMAL HIGH (ref 65–99)

## 2016-07-05 MED ORDER — FLUDEOXYGLUCOSE F - 18 (FDG) INJECTION
13.1400 | Freq: Once | INTRAVENOUS | Status: AC | PRN
  Administered 2016-07-05: 13.14 via INTRAVENOUS

## 2016-07-06 ENCOUNTER — Ambulatory Visit (INDEPENDENT_AMBULATORY_CARE_PROVIDER_SITE_OTHER): Payer: Medicare Other | Admitting: Pulmonary Disease

## 2016-07-06 ENCOUNTER — Other Ambulatory Visit (INDEPENDENT_AMBULATORY_CARE_PROVIDER_SITE_OTHER): Payer: Medicare Other

## 2016-07-06 ENCOUNTER — Encounter: Payer: Self-pay | Admitting: Pulmonary Disease

## 2016-07-06 VITALS — BP 148/98 | HR 85 | Ht 68.0 in | Wt 186.0 lb

## 2016-07-06 DIAGNOSIS — R911 Solitary pulmonary nodule: Secondary | ICD-10-CM

## 2016-07-06 LAB — PROTIME-INR
INR: 1 ratio (ref 0.8–1.0)
Prothrombin Time: 10.3 s (ref 9.6–13.1)

## 2016-07-06 LAB — CBC WITH DIFFERENTIAL/PLATELET
Basophils Absolute: 0.1 10*3/uL (ref 0.0–0.1)
Basophils Relative: 1 % (ref 0.0–3.0)
Eosinophils Absolute: 0.1 10*3/uL (ref 0.0–0.7)
Eosinophils Relative: 2.5 % (ref 0.0–5.0)
HCT: 42.9 % (ref 36.0–46.0)
Hemoglobin: 14.5 g/dL (ref 12.0–15.0)
Lymphocytes Relative: 29.2 % (ref 12.0–46.0)
Lymphs Abs: 1.7 10*3/uL (ref 0.7–4.0)
MCHC: 33.8 g/dL (ref 30.0–36.0)
MCV: 89 fl (ref 78.0–100.0)
Monocytes Absolute: 0.5 10*3/uL (ref 0.1–1.0)
Monocytes Relative: 7.9 % (ref 3.0–12.0)
Neutro Abs: 3.5 10*3/uL (ref 1.4–7.7)
Neutrophils Relative %: 59.4 % (ref 43.0–77.0)
Platelets: 236 10*3/uL (ref 150.0–400.0)
RBC: 4.82 Mil/uL (ref 3.87–5.11)
RDW: 13 % (ref 11.5–15.5)
WBC: 6 10*3/uL (ref 4.0–10.5)

## 2016-07-06 LAB — BASIC METABOLIC PANEL
BUN: 22 mg/dL (ref 6–23)
CO2: 30 mEq/L (ref 19–32)
Calcium: 10 mg/dL (ref 8.4–10.5)
Chloride: 99 mEq/L (ref 96–112)
Creatinine, Ser: 1.12 mg/dL (ref 0.40–1.20)
GFR: 50.59 mL/min — ABNORMAL LOW (ref >60.00)
Glucose, Bld: 161 mg/dL — ABNORMAL HIGH (ref 70–99)
Potassium: 4 mEq/L (ref 3.5–5.1)
Sodium: 136 mEq/L (ref 135–145)

## 2016-07-06 NOTE — Patient Instructions (Signed)
We will get CBC with diff, metabolic panel and PT/INR today We will schedule you for a bronch with EBUS for evaluation of the lung mass, lymph nodes. Weill be in touch soon with the dates  Return in 1-2 months

## 2016-07-06 NOTE — Progress Notes (Addendum)
ADDELINE CALARCO    [id number]    [birth year]  Primary Care Physician:Jones, Arvid Right, MD  Referring Physician: [name], MD [address], [place] [address]  Chief complaint:  Consult for evaluation of abnormal CT scan  HPI: Mrs. Daus is a 76 year old with history of hypertension, diabetes, uterine cancer in 2004, allergies.  She was hospitalized in September 2017 in [place] with cholecystitis, gallstones and had a cholecystectomy done and imaging of abdomen showed pancreatic cyst. She had an MRI of the abdomen done in May 2018 for follow-up of her pancreatic cyst which showed a right lower lobe lung nodule. Subsequent CT scan of the chest and PET scan shows a PET avid right lower lobe lung lesion with right hilar and subcarinal lymphadenopathy. She has been referred to [facility] for further evaluation  She does not report any cough but has right back chest pain. Denies any sputum production, hemoptysis. Noted weight loss over the past few months. She is a lifelong nonsmoker but was exposed to secondhand smoke and no other  known exposures. 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  Outpatient Encounter Prescriptions as of 07/06/2016  Medication Sig  . allopurinol (ZYLOPRIM) 100 MG tablet Take 1 tablet (100 mg total) by mouth daily.  [name] CALCIUM-VITAMIN D PO Take by mouth.  . Cyanocobalamin (VITAMIN B-12 CR PO) Take 1 tablet by mouth daily.  [name] FREESTYLE LITE test strip TEST once daily as directed  . losartan (COZAAR) 100 MG tablet take 1 tablet by mouth once daily  . metFORMIN (GLUCOPHAGE-XR) 750 MG 24 hr tablet take 2 tablets by mouth once daily with BREAKFAST  . Multiple Vitamins-Minerals (MULTIVITAMIN WITH MINERALS) tablet Take 1 tablet by mouth daily.  . nebivolol (BYSTOLIC) 10 MG tablet Take 1 tablet (10 mg total) by mouth daily.  [name] omega-3 acid ethyl esters (LOVAZA) 1 g capsule Take 2 capsules (2 g total) by  mouth 2 (two) times daily.  . Turmeric 450 MG CAPS Take 450 mg by mouth 2 (two) times daily.  [name] UNABLE TO FIND Med Name: Joline Salt Triple action Joint  . DHEA 25 MG CAPS Take 25 mg by mouth 2 (two) times daily.  . mometasone-formoterol (DULERA) 100-5 MCG/ACT AERO Inhale 2 puffs into the lungs 2 times daily at 12 noon and 4 pm. (Patient not taking: Reported on 07/06/2016)   No facility-administered encounter medications on file as of 07/06/2016.     Allergies as of 07/06/2016  . (No Known Allergies)    Past Medical History:  Diagnosis Date  . Bronchitis    hx of  . 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 Diabetes mellitus    type 2  . Hypertension   . Low back pain   . Osteoarthritis     Past Surgical History:  Procedure Laterality Date  . ABDOMINAL HYSTERECTOMY  2004  . APPENDECTOMY  when 76 years old  . COLONOSCOPY  08-05-09   [name]  . INCONTINENCE SURGERY    . KNEE ARTHROSCOPY Left   . LEFT HEART CATHETERIZATION WITH CORONARY ANGIOGRAM N/A 07/19/2013   Procedure: LEFT HEART CATHETERIZATION WITH CORONARY ANGIOGRAM;  Surgeon: Sinclair Grooms, MD;  Location: [facility] CATH LAB;  Service: Cardiovascular;  Laterality: N/A;  . POLYPECTOMY  08-05-09   2 polyps  . TOTAL KNEE ARTHROPLASTY Left 03/28/2012   Procedure: LEFT TOTAL KNEE ARTHROPLASTY;  Surgeon:  Tobi Bastos, MD;  Location: [facility] ORS;  Service: Orthopedics;  Laterality: Left;  . TUBAL LIGATION      Family History  Problem Relation Age of Onset  . Arthritis Other   . Cancer Other        colon, lst degree relative  . Diabetes Other        st degree relative  . Hyperlipidemia Other   . Hypertension Other   . Heart attack Father   . Heart disease Father   . Colon cancer Neg Hx     Social History   Social History  . Marital status: Married    Spouse name: N/A  . Number of children: N/A  . Years of education: N/A   Occupational History  . retired Retired   Social History Main Topics  . Smoking status: Never  Smoker  . Smokeless tobacco: Never Used  . Alcohol use No  . Drug use: No  . Sexual activity: Not Currently   Other Topics Concern  . Not on file   Social History Narrative   Regular exercise- Yes    Review of systems: Review of Systems  Constitutional: Negative for fever and chills.  HENT: Negative.   Eyes: Negative for blurred vision.  Respiratory: as per HPI  Cardiovascular: Negative for chest pain and palpitations.  Gastrointestinal: Negative for vomiting, diarrhea, blood per rectum. Genitourinary: Negative for dysuria, urgency, frequency and hematuria.  Musculoskeletal: Negative for myalgias, back pain and joint pain.  Skin: Negative for itching and rash.  Neurological: Negative for dizziness, tremors, focal weakness, seizures and loss of consciousness.  Endo/Heme/Allergies: Negative for environmental allergies.  Psychiatric/Behavioral: Negative for depression, suicidal ideas and hallucinations.  All other systems reviewed and are negative.  Physical Exam: Blood pressure (!) 148/98, pulse 85, height 5' 8"  (1.727 m), weight 186 lb (84.4 kg), SpO2 95 %. Gen:      No acute distress HEENT:  EOMI, sclera anicteric Neck:     No masses; no thyromegaly Lungs:    Clear to auscultation bilaterally; normal respiratory effort CV:         Regular rate and rhythm; no murmurs Abd:      + bowel sounds; soft, non-tender; no palpable masses, no distension Ext:    No edema; adequate peripheral perfusion Skin:      Warm and dry; no rash Neuro: alert and oriented x 3 Psych: normal mood and affect  Data Reviewed: CT 6/5/1 8- 3.2cm right lower lobe mass, right middle lobe 3 mm nodule. Subcarinal lymphadenopathy.  PET scan 07/05/16- Weakly hypermetabolic right lower lobe pulmonary lesion, subcarinal and right hilar lymphadenopathy with high PET avidity I reviewed her images personally  Assessment:  Evaluation for right lower lobe lung mass with mediastinal lymphadenopathy I reviewed the  scans. The PET scan shows weak uptake in the right lower lobe mass but intense uptake in the lymph nodes. Even though the uptake is weak in the lung lesion this is still suspicious for a malignancy. This may be adenocarcinoma with EGFR mutation which is known to occur with higher frequency in asian female nonsmokers.  She'll need a biopsy for further evaluation. I'll schedule her for a EBUS with sampling of the LNs as this will have the highest yield given the avidity on PET scan. If non diagnostic then we may have to eval the lung lesion with a nav bronchoscopy though this has a higher risk of pneumothorax.  I discussed the various approaches and the risk benefits and  we have decided to try the EBUS first.  Plan/Recommendations: - Schedule for EBUS bronchoscopy.  Marshell Garfinkel MD [facility] Pager [phone] 07/06/2016, 3:07 PM  CC: [name], MD

## 2016-07-08 ENCOUNTER — Encounter (HOSPITAL_COMMUNITY): Payer: Self-pay | Admitting: *Deleted

## 2016-07-08 NOTE — Progress Notes (Signed)
Pt denies SOB, chest pain, and being under the care of a cardiologist. Pt denies having an echo. Pt made aware to stop taking vitamins, Osteo Bioflex, Turmeric and herbal medications. Do not take any NSAIDs ie: Ibuprofen, Advil, Naproxen BC and Goody Powder. Pt made aware to not take Metformin DOS.Pt made aware to check BG every 2 hours prior to arrival to hospital on DOS. Pt made aware to treat a BG < 70 with 4 glucose tabs or glucose gel or 4 ounces of apple or cranberry juice, wait 15 minutes after intervention to recheck BG, if BG remains < 70, call Short Stay unit to speak with a nurse. Pt verbalized understanding of all pre-op instructions.

## 2016-07-10 NOTE — Anesthesia Preprocedure Evaluation (Addendum)
Anesthesia Evaluation  Patient identified by MRN, date of birth, ID band Patient awake    Reviewed: Allergy & Precautions, NPO status , Patient's Chart, lab work & pertinent test results  History of Anesthesia Complications Negative for: history of anesthetic complications  Airway Mallampati: II  TM Distance: >3 FB Neck ROM: Full    Dental no notable dental hx. (+) Dental Advisory Given, Caps   Pulmonary neg pulmonary ROS,    Pulmonary exam normal        Cardiovascular hypertension, Pt. on medications and Pt. on home beta blockers Normal cardiovascular exam     Neuro/Psych  Headaches, negative psych ROS   GI/Hepatic negative GI ROS, Neg liver ROS,   Endo/Other  diabetes  Renal/GU negative Renal ROS     Musculoskeletal   Abdominal   Peds  Hematology   Anesthesia Other Findings   Reproductive/Obstetrics                            Anesthesia Physical Anesthesia Plan  ASA: III  Anesthesia Plan: General   Post-op Pain Management:    Induction: Intravenous  PONV Risk Score and Plan: 3 and Ondansetron, Dexamethasone and Scopolamine patch - Pre-op  Airway Management Planned: Oral ETT  Additional Equipment:   Intra-op Plan:   Post-operative Plan: Extubation in OR  Informed Consent: I have reviewed the patients History and Physical, chart, labs and discussed the procedure including the risks, benefits and alternatives for the proposed anesthesia with the patient or authorized representative who has indicated his/her understanding and acceptance.   Dental advisory given  Plan Discussed with: CRNA and Anesthesiologist  Anesthesia Plan Comments:        Anesthesia Quick Evaluation

## 2016-07-11 ENCOUNTER — Encounter (HOSPITAL_COMMUNITY)
Admission: RE | Disposition: A | Discharge status: Home / Self Care | Payer: Self-pay | Source: Ambulatory Visit | Attending: Pulmonary Disease

## 2016-07-11 ENCOUNTER — Ambulatory Visit (HOSPITAL_COMMUNITY)
Admission: RE | Admit: 2016-07-11 | Discharge: 2016-07-11 | Disposition: A | Discharge status: Home / Self Care | Payer: Medicare Other | Source: Ambulatory Visit | Attending: Pulmonary Disease | Admitting: Pulmonary Disease

## 2016-07-11 ENCOUNTER — Encounter (HOSPITAL_COMMUNITY): Payer: Self-pay | Admitting: Certified Registered Nurse Anesthetist

## 2016-07-11 ENCOUNTER — Ambulatory Visit (HOSPITAL_COMMUNITY): Payer: Medicare Other

## 2016-07-11 ENCOUNTER — Other Ambulatory Visit: Payer: Self-pay

## 2016-07-11 ENCOUNTER — Ambulatory Visit (HOSPITAL_COMMUNITY): Payer: Medicare Other | Admitting: Certified Registered Nurse Anesthetist

## 2016-07-11 DIAGNOSIS — Z7984 Long term (current) use of oral hypoglycemic drugs: Secondary | ICD-10-CM | POA: Diagnosis not present

## 2016-07-11 DIAGNOSIS — Z96642 Presence of left artificial hip joint: Secondary | ICD-10-CM | POA: Insufficient documentation

## 2016-07-11 DIAGNOSIS — R590 Localized enlarged lymph nodes: Secondary | ICD-10-CM | POA: Diagnosis present

## 2016-07-11 DIAGNOSIS — R911 Solitary pulmonary nodule: Secondary | ICD-10-CM | POA: Insufficient documentation

## 2016-07-11 DIAGNOSIS — Z79899 Other long term (current) drug therapy: Secondary | ICD-10-CM | POA: Diagnosis not present

## 2016-07-11 DIAGNOSIS — C801 Malignant (primary) neoplasm, unspecified: Secondary | ICD-10-CM | POA: Insufficient documentation

## 2016-07-11 DIAGNOSIS — C3491 Malignant neoplasm of unspecified part of right bronchus or lung: Secondary | ICD-10-CM

## 2016-07-11 DIAGNOSIS — I10 Essential (primary) hypertension: Secondary | ICD-10-CM | POA: Diagnosis not present

## 2016-07-11 DIAGNOSIS — M1990 Unspecified osteoarthritis, unspecified site: Secondary | ICD-10-CM | POA: Diagnosis not present

## 2016-07-11 DIAGNOSIS — E119 Type 2 diabetes mellitus without complications: Secondary | ICD-10-CM | POA: Insufficient documentation

## 2016-07-11 DIAGNOSIS — R918 Other nonspecific abnormal finding of lung field: Secondary | ICD-10-CM | POA: Diagnosis not present

## 2016-07-11 DIAGNOSIS — Z9889 Other specified postprocedural states: Secondary | ICD-10-CM

## 2016-07-11 DIAGNOSIS — Z8542 Personal history of malignant neoplasm of other parts of uterus: Secondary | ICD-10-CM | POA: Diagnosis not present

## 2016-07-11 DIAGNOSIS — C771 Secondary and unspecified malignant neoplasm of intrathoracic lymph nodes: Secondary | ICD-10-CM | POA: Diagnosis not present

## 2016-07-11 HISTORY — DX: Headache, unspecified: R51.9

## 2016-07-11 HISTORY — DX: Calculus of gallbladder without cholecystitis without obstruction: K80.20

## 2016-07-11 HISTORY — DX: Other nonspecific abnormal finding of lung field: R91.8

## 2016-07-11 HISTORY — DX: Headache: R51

## 2016-07-11 HISTORY — PX: VIDEO BRONCHOSCOPY WITH ENDOBRONCHIAL ULTRASOUND: SHX6177

## 2016-07-11 LAB — GLUCOSE, CAPILLARY
Glucose-Capillary: 164 mg/dL — ABNORMAL HIGH (ref 65–99)
Glucose-Capillary: 148 mg/dL — ABNORMAL HIGH (ref 65–99)

## 2016-07-11 SURGERY — BRONCHOSCOPY, WITH EBUS
Anesthesia: General

## 2016-07-11 MED ORDER — LIDOCAINE HCL (CARDIAC) 20 MG/ML IV SOLN
INTRAVENOUS | Status: DC | PRN
  Administered 2016-07-11: 50 mg via INTRATRACHEAL

## 2016-07-11 MED ORDER — SUGAMMADEX SODIUM 200 MG/2ML IV SOLN
INTRAVENOUS | Status: DC | PRN
  Administered 2016-07-11: 200 mg via INTRAVENOUS

## 2016-07-11 MED ORDER — DEXAMETHASONE SODIUM PHOSPHATE 10 MG/ML IJ SOLN
INTRAMUSCULAR | Status: DC | PRN
  Administered 2016-07-11: 10 mg via INTRAVENOUS

## 2016-07-11 MED ORDER — PROPOFOL 10 MG/ML IV BOLUS
INTRAVENOUS | Status: DC | PRN
  Administered 2016-07-11: 180 mg via INTRAVENOUS

## 2016-07-11 MED ORDER — ROCURONIUM BROMIDE 100 MG/10ML IV SOLN
INTRAVENOUS | Status: DC | PRN
  Administered 2016-07-11: 60 mg via INTRAVENOUS

## 2016-07-11 MED ORDER — HYDROMORPHONE HCL 1 MG/ML IJ SOLN: 0.2500 mg | INTRAMUSCULAR | Status: DC | PRN

## 2016-07-11 MED ORDER — ONDANSETRON HCL 4 MG/2ML IJ SOLN
INTRAMUSCULAR | Status: DC | PRN
  Administered 2016-07-11: 4 mg via INTRAVENOUS

## 2016-07-11 MED ORDER — FENTANYL CITRATE (PF) 100 MCG/2ML IJ SOLN
INTRAMUSCULAR | Status: DC | PRN
Start: 2016-07-11 — End: 2016-07-11
  Administered 2016-07-11: 100 ug via INTRAVENOUS
  Administered 2016-07-11: 50 ug via INTRAVENOUS

## 2016-07-11 MED ORDER — PROMETHAZINE HCL 25 MG/ML IJ SOLN: 6.2500 mg | INTRAMUSCULAR | Status: DC | PRN

## 2016-07-11 MED ORDER — PHENYLEPHRINE HCL 10 MG/ML IJ SOLN
INTRAMUSCULAR | Status: DC | PRN
  Administered 2016-07-11: 80 ug via INTRAVENOUS
  Administered 2016-07-11: 40 ug via INTRAVENOUS
  Administered 2016-07-11 (×2): 80 ug via INTRAVENOUS

## 2016-07-11 MED ORDER — PROPOFOL 10 MG/ML IV BOLUS
INTRAVENOUS | Status: AC
  Filled 2016-07-11: qty 20

## 2016-07-11 MED ORDER — EPHEDRINE SULFATE 50 MG/ML IJ SOLN
INTRAMUSCULAR | Status: DC | PRN
  Administered 2016-07-11 (×2): 5 mg via INTRAVENOUS

## 2016-07-11 MED ORDER — LACTATED RINGERS IV SOLN
INTRAVENOUS | Status: DC | PRN
  Administered 2016-07-11: 07:00:00 via INTRAVENOUS

## 2016-07-11 MED ORDER — DEXTROSE 5 % IV SOLN
INTRAVENOUS | Status: DC | PRN
  Administered 2016-07-11: 20 ug/min via INTRAVENOUS

## 2016-07-11 MED ORDER — FENTANYL CITRATE (PF) 250 MCG/5ML IJ SOLN
INTRAMUSCULAR | Status: AC
  Filled 2016-07-11: qty 5

## 2016-07-11 MED ORDER — SCOPOLAMINE 1 MG/3DAYS TD PT72
1.0000 | MEDICATED_PATCH | TRANSDERMAL | Status: DC
  Administered 2016-07-11: 1.5 mg via TRANSDERMAL
  Filled 2016-07-11: qty 1

## 2016-07-11 MED ORDER — 0.9 % SODIUM CHLORIDE (POUR BTL) OPTIME
TOPICAL | Status: DC | PRN
  Administered 2016-07-11: 1000 mL

## 2016-07-11 SURGICAL SUPPLY — 28 items
BRUSH CYTOL CELLEBRITY 1.5X140 (MISCELLANEOUS) IMPLANT
CANISTER SUCT 3000ML PPV (MISCELLANEOUS) ×2 IMPLANT
CONT SPEC 4OZ CLIKSEAL STRL BL (MISCELLANEOUS) ×3 IMPLANT
COVER BACK TABLE 60X90IN (DRAPES) ×2 IMPLANT
COVER DOME SNAP 22 D (MISCELLANEOUS) ×2 IMPLANT
FILTER STRAW FLUID ASPIR (MISCELLANEOUS) IMPLANT
FORCEPS BIOP RJ4 1.8 (CUTTING FORCEPS) IMPLANT
GAUZE SPONGE 4X4 12PLY STRL (GAUZE/BANDAGES/DRESSINGS) ×2 IMPLANT
GLOVE BIO SURGEON STRL SZ7.5 (GLOVE) ×2 IMPLANT
GOWN STRL REUS W/ TWL LRG LVL3 (GOWN DISPOSABLE) ×2 IMPLANT
GOWN STRL REUS W/TWL LRG LVL3 (GOWN DISPOSABLE) ×4
KIT CLEAN ENDO COMPLIANCE (KITS) ×4 IMPLANT
KIT ROOM TURNOVER OR (KITS) ×2 IMPLANT
MARKER SKIN DUAL TIP RULER LAB (MISCELLANEOUS) ×2 IMPLANT
NDL ECHOTIP HI DEF 22GA (NEEDLE) IMPLANT
NEEDLE ECHOTIP HI DEF 22GA (NEEDLE) ×2 IMPLANT
NEEDLE SONO TIP II EBUS (NEEDLE) ×1 IMPLANT
NS IRRIG 1000ML POUR BTL (IV SOLUTION) ×2 IMPLANT
OIL SILICONE PENTAX (PARTS (SERVICE/REPAIRS)) ×2 IMPLANT
PAD ARMBOARD 7.5X6 YLW CONV (MISCELLANEOUS) ×4 IMPLANT
SYR 20CC LL (SYRINGE) ×1 IMPLANT
SYR 20ML ECCENTRIC (SYRINGE) ×4 IMPLANT
SYR 3ML LL SCALE MARK (SYRINGE) IMPLANT
SYR 5ML LUER SLIP (SYRINGE) ×2 IMPLANT
TOWEL OR 17X24 6PK STRL BLUE (TOWEL DISPOSABLE) ×2 IMPLANT
TRAP SPECIMEN MUCOUS 40CC (MISCELLANEOUS) IMPLANT
TUBE CONNECTING 20X1/4 (TUBING) ×3 IMPLANT
WATER STERILE IRR 1000ML POUR (IV SOLUTION) ×2 IMPLANT

## 2016-07-11 NOTE — Anesthesia Postprocedure Evaluation (Signed)
Anesthesia Post Note  Patient: Nicole [name]  Procedure(s) Performed: Procedure(s) (LRB): VIDEO BRONCHOSCOPY WITH ENDOBRONCHIAL ULTRASOUND (N/A)     Patient location during evaluation: PACU Anesthesia Type: General Level of consciousness: awake and alert Pain management: pain level controlled Vital Signs Assessment: post-procedure vital signs reviewed and stable Respiratory status: spontaneous breathing and respiratory function stable Cardiovascular status: blood pressure returned to baseline and stable Postop Assessment: spinal receding Anesthetic complications: no    Last Vitals:  Vitals:   07/11/16 0940 07/11/16 0945  BP: 137/76 (!) 146/78  Pulse: 76 76  Resp: 16 19  Temp: 36.4 C     Last Pain:  Vitals:   07/11/16 0945  TempSrc:   PainSc: 0-No pain                 [name] DANIEL

## 2016-07-11 NOTE — Discharge Instructions (Signed)
Please resume your home medications You will have some coughing and blood streaks in the sputum. That is normal after the procedure. Please call if you are coughing up more than 1/2 cup per day You can use over the counter cough meds and tylenol, advil for cough and pain I will call with the final results of the biopsy

## 2016-07-11 NOTE — H&P (Signed)
Pt seen and examined in per op area See clinic note from 6/13 for full details  Blood pressure (!) 165/73, pulse 65, temperature 97.7 F (36.5 C), temperature source Oral, resp. rate 20, weight 186 lb (84.4 kg), SpO2 98 %. Gen:      No acute distress HEENT:  EOMI, sclera anicteric Neck:     No masses; no thyromegaly Lungs:    Clear to auscultation bilaterally; normal respiratory effort CV:         Regular rate and rhythm; no murmurs Abd:      + bowel sounds; soft, non-tender; no palpable masses, no distension Ext:    No edema; adequate peripheral perfusion Skin:      Warm and dry; no rash Neuro: alert and oriented x 3 Psych: normal mood and affect  Labs and imaging reviewed  Assessment/plan: RLL lung mass with hilar and mediastinal LN Ok to proceed with bronch, EBUS Consent obtained  Marshell Garfinkel MD [facility] Pager [phone] If no answer or after 3pm call: [phone] 07/11/2016, 7:53 AM

## 2016-07-11 NOTE — Transfer of Care (Signed)
Immediate Anesthesia Transfer of Care Note  Patient: Nicole [name]  Procedure(s) Performed: Procedure(s): VIDEO BRONCHOSCOPY WITH ENDOBRONCHIAL ULTRASOUND (N/A)  Patient Location: PACU  Anesthesia Type:General  Level of Consciousness: awake, alert , oriented and patient cooperative  Airway & Oxygen Therapy: Patient Spontanous Breathing and Patient connected to nasal cannula oxygen  Post-op Assessment: Report given to RN and Post -op Vital signs reviewed and stable  Post vital signs: Reviewed and stable  Last Vitals:  Vitals:   07/11/16 0608 07/11/16 0905  BP: (!) 165/73 (!) (P) 147/74  Pulse: 65   Resp: 20   Temp: 36.5 C (P) 36.3 C    Last Pain:  Vitals:   07/11/16 7096  TempSrc: Oral      Patients Stated Pain Goal: 3 (28/36/62 9476)  Complications: No apparent anesthesia complications

## 2016-07-11 NOTE — Procedures (Signed)
Video Bronchoscopy with Endobronchial Ultrasound Procedure Note  Date of Operation: 11/04/2015  Pre-op Diagnosis: Lung nodule and mediastinal LAD  Post-op Diagnosis: Same  Surgeon: Marshell Garfinkel  Assistants:   Anesthesia: General endotracheal anesthesia  Operation: Flexible video fiberoptic bronchoscopy with endobronchial ultrasound and biopsies.  Estimated Blood Loss: Minimal  Complications: None apparent  Indications and History: Nicole [name] is a  y.o. female with hx right lower lobe lung nodule with mediastinal LNs on CT chest. Recommendation made to achieve tissue sampling via endobronchial ultrasound guided nodal biopsies. The risks, benefits, complications, treatment options and expected outcomes were discussed with the patient.  The possibilities of pneumothorax, pneumonia, reaction to medication, pulmonary aspiration, perforation of a viscus, bleeding, failure to diagnose a condition and creating a complication requiring transfusion or operation were discussed with the patient who freely signed the consent.    Description of Procedure: The patient was examined in the preoperative area and history and data from the preprocedure consultation were reviewed. It was deemed appropriate to proceed.  The patient was taken to OR 10, identified as Nicole [name] and the procedure verified as Flexible Video Fiberoptic Bronchoscopy.  A Time Out was held and the above information confirmed. After being taken to the operating room general anesthesia was initiated and the patient  was orally intubated. The video fiberoptic bronchoscope was introduced via the endotracheal tube and a general inspection was performed which showed normal anatomy. The standard scope was then withdrawn and the endobronchial ultrasound was used to identify and characterize the peritracheal, hilar and bronchial lymph nodes. Inspection showed enlargement of station 7 node. The right hilar LNs were borderline in size.  Using real-time ultrasound guidance Wang needle biopsies were take from Station 7 node and sent for cytology.  At the end of the procedure a general airway inspection was performed and there was no evidence of active bleeding. The bronchoscope was removed.  The patient tolerated the procedure well. There was no significant blood loss and there were no obvious complications. A post-procedural chest x-ray is pending.  The patient tolerated the procedure well without apparent complications. There was no significant blood loss. The bronchoscope was withdrawn. Anesthesia was reversed and the patient was taken to the PACU for recovery.   Samples: 1. Wang needle biopsies from 7 node  Plans:  The patient will be discharged from the PACU to home when recovered from anesthesia and after chest x-ray is reviewed. We will review the cytology, pathology results with the patient when they become available. Outpatient followup will be with Dr Katheran James MD [facility] Pager [phone] If no answer or after 3pm call: [phone] 07/11/2016, 9:06 AM

## 2016-07-11 NOTE — Anesthesia Procedure Notes (Signed)
Procedure Name: Intubation Date/Time: 07/11/2016 7:40 AM Performed by: Shirlyn Goltz Pre-anesthesia Checklist: Patient identified, Emergency Drugs available, Suction available and Patient being monitored Patient Re-evaluated:Patient Re-evaluated prior to inductionOxygen Delivery Method: Circle system utilized Preoxygenation: Pre-oxygenation with 100% oxygen Intubation Type: IV induction Ventilation: Mask ventilation without difficulty Laryngoscope Size: Mac and 3 Grade View: Grade I Tube type: Oral Tube size: 8.5 mm Number of attempts: 1 Airway Equipment and Method: Stylet and LTA kit utilized Placement Confirmation: ETT inserted through vocal cords under direct vision,  positive ETCO2 and breath sounds checked- equal and bilateral Secured at: 22 cm Tube secured with: Tape Dental Injury: Teeth and Oropharynx as per pre-operative assessment

## 2016-07-12 ENCOUNTER — Telehealth: Payer: Self-pay | Admitting: *Deleted

## 2016-07-12 ENCOUNTER — Encounter (HOSPITAL_COMMUNITY): Payer: Self-pay | Admitting: Pulmonary Disease

## 2016-07-12 DIAGNOSIS — R918 Other nonspecific abnormal finding of lung field: Secondary | ICD-10-CM

## 2016-07-12 NOTE — Telephone Encounter (Signed)
Oncology Nurse Navigator Documentation  Oncology Nurse Navigator Flowsheets 07/12/2016  Navigator Location [facility]  Referral date to RadOnc/MedOnc 07/11/2016  Navigator Encounter Type Telephone/I spoke with Dr. Julien Nordmann regarding referral.  He states stage III and patient needs MTOC to see med onc and rad on.  I called Ms. Nicole [name] and updated her on appt to see Dr. Julien Nordmann and Rad Onc on 07/28/16. She verbalized understanding of appt time and place.  Per Dr. Julien Nordmann I will contact [facility] to send molecular testing and PDL 1.    Telephone Outgoing Call  Treatment Phase Pre-Tx/Tx Discussion  Barriers/Navigation Needs Coordination of Care  Interventions Coordination of Care  Coordination of Care Appts;Other  Acuity Level 2  Acuity Level 2 Assistance expediting appointments;Other  Time Spent with Patient 30

## 2016-07-25 ENCOUNTER — Encounter: Payer: Self-pay | Admitting: *Deleted

## 2016-07-27 ENCOUNTER — Other Ambulatory Visit: Payer: Self-pay | Admitting: Internal Medicine

## 2016-07-27 DIAGNOSIS — M1000 Idiopathic gout, unspecified site: Secondary | ICD-10-CM

## 2016-07-28 ENCOUNTER — Ambulatory Visit (HOSPITAL_BASED_OUTPATIENT_CLINIC_OR_DEPARTMENT_OTHER): Payer: Medicare Other | Admitting: Internal Medicine

## 2016-07-28 ENCOUNTER — Other Ambulatory Visit (HOSPITAL_BASED_OUTPATIENT_CLINIC_OR_DEPARTMENT_OTHER): Payer: Medicare Other

## 2016-07-28 ENCOUNTER — Encounter: Payer: Self-pay | Admitting: Internal Medicine

## 2016-07-28 ENCOUNTER — Telehealth: Payer: Self-pay | Admitting: Internal Medicine

## 2016-07-28 ENCOUNTER — Ambulatory Visit
Admission: RE | Admit: 2016-07-28 | Discharge: 2016-07-28 | Disposition: A | Discharge status: Home / Self Care | Payer: Medicare Other | Source: Ambulatory Visit | Attending: Radiation Oncology | Admitting: Radiation Oncology

## 2016-07-28 ENCOUNTER — Ambulatory Visit: Payer: Medicare Other | Attending: Internal Medicine | Admitting: Physical Therapy

## 2016-07-28 DIAGNOSIS — C3431 Malignant neoplasm of lower lobe, right bronchus or lung: Secondary | ICD-10-CM | POA: Diagnosis not present

## 2016-07-28 DIAGNOSIS — Z7901 Long term (current) use of anticoagulants: Secondary | ICD-10-CM | POA: Insufficient documentation

## 2016-07-28 DIAGNOSIS — K862 Cyst of pancreas: Secondary | ICD-10-CM | POA: Insufficient documentation

## 2016-07-28 DIAGNOSIS — R29898 Other symptoms and signs involving the musculoskeletal system: Secondary | ICD-10-CM | POA: Diagnosis present

## 2016-07-28 DIAGNOSIS — Z8542 Personal history of malignant neoplasm of other parts of uterus: Secondary | ICD-10-CM

## 2016-07-28 DIAGNOSIS — Z9889 Other specified postprocedural states: Secondary | ICD-10-CM | POA: Insufficient documentation

## 2016-07-28 DIAGNOSIS — Z809 Family history of malignant neoplasm, unspecified: Secondary | ICD-10-CM | POA: Insufficient documentation

## 2016-07-28 DIAGNOSIS — R1310 Dysphagia, unspecified: Secondary | ICD-10-CM | POA: Insufficient documentation

## 2016-07-28 DIAGNOSIS — Z9049 Acquired absence of other specified parts of digestive tract: Secondary | ICD-10-CM | POA: Insufficient documentation

## 2016-07-28 DIAGNOSIS — C3491 Malignant neoplasm of unspecified part of right bronchus or lung: Secondary | ICD-10-CM | POA: Insufficient documentation

## 2016-07-28 DIAGNOSIS — Z8261 Family history of arthritis: Secondary | ICD-10-CM | POA: Insufficient documentation

## 2016-07-28 DIAGNOSIS — Z79899 Other long term (current) drug therapy: Secondary | ICD-10-CM | POA: Insufficient documentation

## 2016-07-28 DIAGNOSIS — R918 Other nonspecific abnormal finding of lung field: Secondary | ICD-10-CM

## 2016-07-28 DIAGNOSIS — Z96652 Presence of left artificial knee joint: Secondary | ICD-10-CM | POA: Insufficient documentation

## 2016-07-28 DIAGNOSIS — K8010 Calculus of gallbladder with chronic cholecystitis without obstruction: Secondary | ICD-10-CM | POA: Insufficient documentation

## 2016-07-28 DIAGNOSIS — E119 Type 2 diabetes mellitus without complications: Secondary | ICD-10-CM | POA: Diagnosis not present

## 2016-07-28 DIAGNOSIS — R590 Localized enlarged lymph nodes: Secondary | ICD-10-CM | POA: Insufficient documentation

## 2016-07-28 DIAGNOSIS — R05 Cough: Secondary | ICD-10-CM | POA: Insufficient documentation

## 2016-07-28 DIAGNOSIS — Z7189 Other specified counseling: Secondary | ICD-10-CM | POA: Insufficient documentation

## 2016-07-28 DIAGNOSIS — R2689 Other abnormalities of gait and mobility: Secondary | ICD-10-CM | POA: Diagnosis not present

## 2016-07-28 DIAGNOSIS — Z8249 Family history of ischemic heart disease and other diseases of the circulatory system: Secondary | ICD-10-CM | POA: Insufficient documentation

## 2016-07-28 DIAGNOSIS — Z5111 Encounter for antineoplastic chemotherapy: Secondary | ICD-10-CM

## 2016-07-28 DIAGNOSIS — Z833 Family history of diabetes mellitus: Secondary | ICD-10-CM | POA: Insufficient documentation

## 2016-07-28 DIAGNOSIS — Z7984 Long term (current) use of oral hypoglycemic drugs: Secondary | ICD-10-CM | POA: Insufficient documentation

## 2016-07-28 DIAGNOSIS — R0602 Shortness of breath: Secondary | ICD-10-CM | POA: Insufficient documentation

## 2016-07-28 DIAGNOSIS — I10 Essential (primary) hypertension: Secondary | ICD-10-CM

## 2016-07-28 DIAGNOSIS — Z510 Encounter for antineoplastic radiation therapy: Secondary | ICD-10-CM | POA: Insufficient documentation

## 2016-07-28 DIAGNOSIS — M545 Low back pain: Secondary | ICD-10-CM | POA: Insufficient documentation

## 2016-07-28 HISTORY — DX: Malignant neoplasm of unspecified part of right bronchus or lung: C34.91

## 2016-07-28 LAB — CBC WITH DIFFERENTIAL/PLATELET
BASO%: 0.4 % (ref 0.0–2.0)
Basophils Absolute: 0 10*3/uL (ref 0.0–0.1)
EOS%: 2.5 % (ref 0.0–7.0)
Eosinophils Absolute: 0.1 10*3/uL (ref 0.0–0.5)
HCT: 42.2 % (ref 34.8–46.6)
HGB: 14.1 g/dL (ref 11.6–15.9)
LYMPH%: 30.9 % (ref 14.0–49.7)
MCH: 30.3 pg (ref 25.1–34.0)
MCHC: 33.4 g/dL (ref 31.5–36.0)
MCV: 90.8 fL (ref 79.5–101.0)
MONO#: 0.4 10*3/uL (ref 0.1–0.9)
MONO%: 7.4 % (ref 0.0–14.0)
NEUT#: 3.1 10*3/uL (ref 1.5–6.5)
NEUT%: 58.8 % (ref 38.4–76.8)
Platelets: 220 10*3/uL (ref 145–400)
RBC: 4.65 10*6/uL (ref 3.70–5.45)
RDW: 13.1 % (ref 11.2–14.5)
WBC: 5.3 10*3/uL (ref 3.9–10.3)
lymph#: 1.6 10*3/uL (ref 0.9–3.3)

## 2016-07-28 LAB — COMPREHENSIVE METABOLIC PANEL
ALT: 30 U/L (ref 0–55)
AST: 24 U/L (ref 5–34)
Albumin: 3.6 g/dL (ref 3.5–5.0)
Alkaline Phosphatase: 74 U/L (ref 40–150)
Anion Gap: 9 mEq/L (ref 3–11)
BUN: 22.6 mg/dL (ref 7.0–26.0)
CO2: 26 mEq/L (ref 22–29)
Calcium: 10.3 mg/dL (ref 8.4–10.4)
Chloride: 105 mEq/L (ref 98–109)
Creatinine: 1.2 mg/dL — ABNORMAL HIGH (ref 0.6–1.1)
EGFR: 43 mL/min/{1.73_m2} — ABNORMAL LOW (ref >90)
Glucose: 168 mg/dl — ABNORMAL HIGH (ref 70–140)
Potassium: 4.1 mEq/L (ref 3.5–5.1)
Sodium: 139 mEq/L (ref 136–145)
Total Bilirubin: 0.83 mg/dL (ref 0.20–1.20)
Total Protein: 7.7 g/dL (ref 6.4–8.3)

## 2016-07-28 MED ORDER — PROCHLORPERAZINE MALEATE 10 MG PO TABS: 10.0000 mg | ORAL_TABLET | Freq: Four times a day (QID) | ORAL | 0 refills | Status: DC | PRN

## 2016-07-28 NOTE — Therapy (Signed)
[place], [place], [address] Phone: [phone]   Fax:  [phone]  Physical Therapy Evaluation  Patient Details  Name: Nicole [name] MRN: [id number] Date of Birth: [birth year] Referring Provider: Dr. Curt Bears  Encounter Date: 07/28/2016      PT End of Session - 07/28/16 1456    Visit Number 1   Number of Visits 1   PT Start Time 9767   PT Stop Time 3419   PT Time Calculation (min) 25 min   Activity Tolerance Patient tolerated treatment well   Behavior During Therapy [facility] for tasks assessed/performed      Past Medical History:  Diagnosis Date  . Adenocarcinoma of right lung, stage 3 ([place]) 07/28/2016  . Bronchitis    hx of  . Cancer [facility]) 2004   uterine/cervical  . Diabetes mellitus    type 2  . Gallstones   . Headache   . Hypertension   . Low back pain   . Lung mass    with lymphadenopathy  . Osteoarthritis     Past Surgical History:  Procedure Laterality Date  . ABDOMINAL HYSTERECTOMY  2004  . APPENDECTOMY  when 76 years old  . COLONOSCOPY  08-05-09   [name]  . INCONTINENCE SURGERY    . KNEE ARTHROSCOPY Left   . LEFT HEART CATHETERIZATION WITH CORONARY ANGIOGRAM N/A 07/19/2013   Procedure: LEFT HEART CATHETERIZATION WITH CORONARY ANGIOGRAM;  Surgeon: Sinclair Grooms, MD;  Location: [facility] CATH LAB;  Service: Cardiovascular;  Laterality: N/A;  . POLYPECTOMY  08-05-09   2 polyps  . TOTAL KNEE ARTHROPLASTY Left 03/28/2012   Procedure: LEFT TOTAL KNEE ARTHROPLASTY;  Surgeon: Tobi Bastos, MD;  Location: [facility] ORS;  Service: Orthopedics;  Laterality: Left;  . TUBAL LIGATION    . VIDEO BRONCHOSCOPY WITH ENDOBRONCHIAL ULTRASOUND N/A 07/11/2016   Procedure: VIDEO BRONCHOSCOPY WITH ENDOBRONCHIAL ULTRASOUND;  Surgeon: Marshell Garfinkel, MD;  Location: [place];  Service: Pulmonary;  Laterality: N/A;    There were no vitals filed for this visit.       Subjective Assessment - 07/28/16  1443    Subjective Reports she does Qi-gong exercise and that has helped her. Doctors want to do a right total knee replacement, but she doesn't want it. Long walks are limited by pain.   Patient is accompained by: Family member  daughter plus her best friend   Pertinent History Incidental finding of lung nodule when being worked up for pancreatic cyst.  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  Chemoradiation treatment is being recommended for her. Never smoker.  h/o hysterectomy for uterine cancer; cardiac catheterization 2015; left TKA 2014 with good outcome; has some right knee pain.   Patient Stated Goals get info from all lung clinic providers   Currently in Pain? No/denies  but has intermittent right mid-back pain that was not relieved after gall bladder surgery            [facility] PT Assessment - 07/28/16 0001      Assessment   Medical Diagnosis stage IIIA adenocarcinoma of right lower lobe, 3.2 cm.   Referring Provider Dr. Curt Bears   Onset Date/Surgical Date 06/16/16   Prior Therapy none     Precautions   Precautions Other (comment)   Precaution Comments cancer precautions     Restrictions   Weight Bearing Restrictions No     Balance Screen  Has the patient fallen in the past 6 months No   Has the patient had a decrease in activity level because of a fear of falling?  No  but says she is cautious to avoid falling on left TKA   Is the patient reluctant to leave their home because of a fear of falling?  No     Home Environment   Living Environment Private residence   Living Arrangements Spouse/significant other  45 year-old husband with heart pump   Type of [place] to enter   Entrance Stairs-Number of Steps [place] One level     Prior Function   Level of Independence Independent   Leisure Does Qi-gong exercise 30 minutes 2x/day every day and says it makes her  sweats  also dances with her 76 year-old granddaughter     Cognition   Overall Cognitive Status Within Functional Limits for tasks assessed     Coordination   Gross Motor Movements are Fluid and Coordinated Yes     Functional Tests   Functional tests Sit to Stand     Sit to Stand   Comments 18 times in 30 seconds, above average for age  mild dyspnea following     Posture/Postural Control   Posture/Postural Control No significant limitations     ROM / Strength   AROM / PROM / Strength AROM     AROM   Overall AROM Comments standing trunk AROM WFL in flexion and extension, 25% loss in bilat. sidebend, minimally limited in rotation bilat.     Ambulation/Gait   Ambulation/Gait Yes   Ambulation/Gait Assistance 6: Modified independent (Device/Increase time)   Ambulation Distance (Feet) --  has knee pain with longer walks   Gait Pattern Antalgic  right antalgic/left stiff knee   Ambulation Surface Level   Stairs --  says she uses step-to pattern on stairs     Balance   Balance Assessed Yes     Dynamic Standing Balance   Dynamic Standing - Comments reaches forward 8 inches in standing, below average for age            Objective measurements completed on examination: See above findings.                  PT Education - 07/28/16 1456    Education provided Yes   Education Details energy conservation, walking or other exercise, CURE article on staying active, posture, breathing, PT info   Person(s) Educated Patient;Child(ren);Other (comment)  friend   Methods Explanation;Handout   Comprehension Verbalized understanding               Lung Clinic Goals - 07/28/16 1532      Patient will be able to verbalize understanding of the benefit of exercise to decrease fatigue.   Status Achieved     Patient will be able to verbalize the importance of posture.   Status Achieved     Patient will be able to demonstrate diaphragmatic breathing for improved  lung function.   Status Achieved     Patient will be able to verbalize understanding of the role of physical therapy to prevent functional decline and who to contact if physical therapy is needed.   Status Achieved              Plan - 07/28/16 1457    Clinical Impression Statement This is a very pleasant and energetic woman with a new diagnosis of stage IIIA  lung cancer; she is expected to have chemoradiation.  She has a gait deviation but did well on 30 second sit to stand; she reports difficulty with steps, using step-to pattern; gait distance is limited by pain; trunk AROM is mildly limited.   History and Personal Factors relevant to plan of care: h/o Lt. TKA, right knee pain, h/o uterine cancer   Clinical Presentation Evolving   Clinical Presentation due to: new diagnosis of lung cancer and expected to have chemoradiation soon   Clinical Decision Making Moderate   Rehab Potential Good   PT Frequency One time visit   PT Treatment/Interventions Patient/family education   PT Next Visit Plan None at this time.   PT Home Exercise Plan breathing exercise, walking or other exercise such as her Qi-gong   Consulted and Agree with Plan of Care Patient      Patient will benefit from skilled therapeutic intervention in order to improve the following deficits and impairments:  Abnormal gait, Decreased range of motion  Visit Diagnosis: Other abnormalities of gait and mobility - Plan: PT plan of care cert/re-cert  Other symptoms and signs involving the musculoskeletal system - Plan: PT plan of care cert/re-cert      G-Codes - 78/93/81 1532    Functional Assessment Tool Used (Outpatient Only) clinical judgement   Functional Limitation Mobility: Walking and moving around   Mobility: Walking and Moving Around Current Status ([id number]) At least 1 percent but less than 20 percent impaired, limited or restricted   Mobility: Walking and Moving Around Goal Status [phone]) At least 1 percent but  less than 20 percent impaired, limited or restricted   Mobility: Walking and Moving Around Discharge Status [phone]) At least 1 percent but less than 20 percent impaired, limited or restricted       Problem List Patient Active Problem List   Diagnosis Date Noted  . Adenocarcinoma of right lung, stage 3 ([place]) 07/28/2016  . Encounter for antineoplastic chemotherapy 07/28/2016  . Goals of care, counseling/discussion 07/28/2016  . Right lower lobe lung mass 06/19/2016  . Pancreatic cyst 11/09/2015  . Estrogen deficiency 11/09/2015  . Routine general medical examination at a health care facility 06/30/2014  . Gout 01/18/2013  . HYPERTRIGLYCERIDEMIA 01/18/2013  . Neck pain on left side 01/18/2013  . Hypertriglyceridemia 08/22/2012  . Obesity (BMI 30-39.9) 08/22/2012  . Osteopenia 10/31/2011  . Type II diabetes mellitus with manifestations ([place]) 01/20/2009  . Essential hypertension 01/20/2009  . OSTEOARTHRITIS 01/20/2009  . LOW BACK PAIN 01/20/2009    Ison Wichmann 07/28/2016, 3:34 PM  [facility] [place], [place], [address] Phone: [phone]   Fax:  [phone]  Name: Nicole [name] MRN: [id number] Date of Birth: July 14, 1940  Serafina Royals, PT 07/28/16 3:34 PM

## 2016-07-28 NOTE — Progress Notes (Signed)
START ON PATHWAY REGIMEN - Non-Small Cell Lung     Administer weekly:     Paclitaxel      Carboplatin   **Always confirm dose/schedule in your pharmacy ordering system**  Patient Characteristics: Stage III - Unresectable, PS = 0, 1 AJCC T Category: T1b Current Disease Status: No Distant Mets or Local Recurrence AJCC N Category: N2 AJCC M Category: M0 AJCC 8 Stage Grouping: IIIA Performance Status: PS = 0, 1 Intent of Therapy: Curative Intent, Discussed with Patient 

## 2016-07-28 NOTE — Telephone Encounter (Signed)
Scheduled appt per 7/5 los - Gave patient AVS and calender per los.

## 2016-07-28 NOTE — Progress Notes (Signed)
Radiation Oncology         [phone] ________________________________  Initial outpatient Consultation  Name: Nicole [name] MRN: [id number]  Date: 07/28/2016  DOB: 1940/06/08  EG:[id number], Arvid Right, MD  [name], MD   REFERRING PHYSICIAN: [name], MD  DIAGNOSIS: The encounter diagnosis was Adenocarcinoma of right lung, stage 3 ([place]).  HISTORY OF PRESENT ILLNESS::Nicole [name] is a 76 y.o. female who has a history of hypertension, diabetes, uterine cancer in 2004, and allergies. The patient has a history of lower back pain. She was hospitalized in September 2017 in [place], [place] with cholecystitis, gallstones and had a cholecystectomy done and imaging of abdomen showed pancreatic cyst. She had an MRI of the abdomen done in May 2018 for follow-up of her pancreatic cyst which showed a right lower lobe lung nodule. Subsequent CT scan of the chest and PET scan on 07/05/2016 shows a PET avid right lower lobe lung lesion with right hilar and subcarinal lymphadenopathy. The PET scan on 07/05/16 also revealed a 2.3 cm right lower lobe pulmonary lesion. There was also 2.4 cm subcarinal nodal lesion that was hypermetabolic with SUV max of 8.6 and there was also hypermetabolic right hilar lymph nodes with SUV max of 4.9 and 3.6.   On 07/11/2016 the patient underwent flexible video fiberoptic bronchoscopy with endobronchial ultrasound and biopsies. The final cytology ([id number]) showed non-small cell lung cancer and the immunohistochemical stain showed the tumor cells were positive for TTF-1 and essentially negative for cytokeratin 5/6. The immunophenotype is most consistent with adenocarcinoma. Dr. [name] kindly referred the patient to the multidisciplinary thoracic oncology clinic today for evaluation and discussion of her treatment options.  The patient presents today with her mother and daughter.     PREVIOUS RADIATION THERAPY: No  PAST MEDICAL HISTORY:  has a past medical  history of Adenocarcinoma of right lung, stage 3 ([place]) (07/28/2016); Bronchitis; Cancer [facility]) (2004); Diabetes mellitus; Gallstones; Headache; Hypertension; Low back pain; Lung mass; and Osteoarthritis.    PAST SURGICAL HISTORY: Past Surgical History:  Procedure Laterality Date  . ABDOMINAL HYSTERECTOMY  2004  . APPENDECTOMY  when 76 years old  . COLONOSCOPY  08-05-09   [name]  . INCONTINENCE SURGERY    . KNEE ARTHROSCOPY Left   . LEFT HEART CATHETERIZATION WITH CORONARY ANGIOGRAM N/A 07/19/2013   Procedure: LEFT HEART CATHETERIZATION WITH CORONARY ANGIOGRAM;  Surgeon: Sinclair Grooms, MD;  [facility] C[facility] LAB;  Service: Cardiovascular;  Laterality: N/A;  . POLYPECTOMY  08-05-09   2 polyps  . TOTAL KNEE ARTHROPLASTY Left 03/28/2012   Procedure: LEFT TOTAL KNEE ARTHROPLASTY;  Surgeon: Tobi Bastos, MD;  Location: [facility] ORS;  Service: Orthopedics;  Laterality: Left;  . TUBAL LIGATION    . VIDEO BRONCHOSCOPY WITH ENDOBRONCHIAL ULTRASOUND N/A 07/11/2016   Procedure: VIDEO BRONCHOSCOPY WITH ENDOBRONCHIAL ULTRASOUND;  Surgeon: Marshell Garfinkel, MD;  Location: [facility];  Service: Pulmonary;  Laterality: N/A;    FAMILY HISTORY: family history includes Arthritis in her other; Cancer in her other; Diabetes in her other; Heart attack in her father; Heart disease in her father; Hyperlipidemia in her other; Hypertension in her other.  SOCIAL HISTORY:  reports that she has never smoked. She has never used smokeless tobacco. She reports that she does not drink alcohol or use drugs. The patient is married and her husband is dealing with gastric cancer. She has 3 children. She was accompanied today by her daughter [name]. She is currently retired and used to  run a [employer]. She has no history of smoking, alcohol or drug abuse. 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  ALLERGIES: No known allergies  MEDICATIONS:  Current Outpatient Prescriptions  Medication Sig Dispense  Refill  . allopurinol (ZYLOPRIM) 100 MG tablet take 1 tablet by mouth once daily 90 tablet 1  . CALCIUM-VITAMIN D PO Take 1 tablet by mouth daily.     . Cholecalciferol (VITAMIN D3 PO) Take 1 tablet by mouth daily.    . Cyanocobalamin (VITAMIN B-12 CR PO) Take 250 mcg by mouth daily.     [name] FREESTYLE LITE test strip TEST once daily as directed 100 each 11  . losartan (COZAAR) 100 MG tablet take 1 tablet by mouth once daily 90 tablet 3  . metFORMIN (GLUCOPHAGE-XR) 750 MG 24 hr tablet take 2 tablets by mouth once daily with BREAKFAST 180 tablet 1  . Misc Natural Products (OSTEO BI-FLEX TRIPLE STRENGTH) TABS Take 1 tablet by mouth daily. Triple Action [employer] by Joline Salt    . mometasone-formoterol (DULERA) 100-5 MCG/ACT AERO Inhale 2 puffs into the lungs 2 times daily at 12 noon and 4 pm. (Patient not taking: Reported on 07/06/2016) 1 Inhaler 1  . Multiple Vitamins-Minerals (MULTIVITAMIN WITH MINERALS) tablet Take 1 tablet by mouth daily.    . nebivolol (BYSTOLIC) 10 MG tablet Take 1 tablet (10 mg total) by mouth daily. 119 tablet 0  . omega-3 acid ethyl esters (LOVAZA) 1 g capsule Take 2 capsules (2 g total) by mouth 2 (two) times daily. (Patient not taking: Reported on 07/07/2016) 120 capsule 11  . prochlorperazine (COMPAZINE) 10 MG tablet Take 1 tablet (10 mg total) by mouth every 6 (six) hours as needed for nausea or vomiting. 30 tablet 0  . Turmeric 450 MG CAPS Take 900 mg by mouth daily.      No current facility-administered medications for this encounter.     REVIEW OF SYSTEMS:  REVIEW OF SYSTEMS: A 10+ POINT REVIEW OF SYSTEMS WAS OBTAINED including neurology, dermatology, psychiatry, cardiac, respiratory, lymph, extremities, GI, GU, musculoskeletal, constitutional, reproductive, HEENT. All pertinent positives are noted in the HPI. All others are negative. 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 She  also lost few pounds recently. She denied having any chest pain, shortness of breath but has mild cough with no hemoptysis. She denied having any nausea, vomiting, diarrhea or constipation. She has intermittent headache with no visual changes.    PHYSICAL EXAM:   07/28/16 07/06/16 06/14/16  BP 150/69 148/98 142/78  Pulse Rate 86 85 77  Resp 19 -- 16  Temp 97.8 F (36.6 C) -- 98 F (36.7 C)  Temp Source Oral -- Oral  SpO2 99 % 95 % 98 %  Weight 183 lb 3.2 oz (83.1 kg) 186 lb (84.4 kg) 184 lb 4 oz (83.6 kg)  Height 5\' 8"  (1.727 m) 5\' 8"  (1.727 m) 5\' 8"  (1.727 m)     General: Alert and oriented, in no acute distress HEENT: Head is normocephalic. Extraocular movements are intact. Oropharynx is clear. Neck: Neck is supple, no palpable cervical or supraclavicular lymphadenopathy. Heart: Regular in rate and rhythm with no murmurs, rubs, or gallops. Chest: Clear to auscultation bilaterally, with no rhonchi, wheezes, or rales. Abdomen: Soft, nontender, nondistended, with no rigidity or guarding. Extremities: No cyanosis or edema. Lymphatics: see Neck Exam Skin: No concerning lesions. Musculoskeletal: symmetric  strength and muscle tone throughout. Neurologic: Cranial nerves II through XII are grossly intact. No obvious focalities. Speech is fluent. Coordination is intact. Psychiatric: Judgment and insight are intact. Affect is appropriate.  ECOG = 1  LABORATORY DATA:  Lab Results  Component Value Date   WBC 5.3 07/28/2016   HGB 14.1 07/28/2016   HCT 42.2 07/28/2016   MCV 90.8 07/28/2016   PLT 220 07/28/2016   NEUTROABS 3.1 07/28/2016   Lab Results  Component Value Date   NA 139 07/28/2016   K 4.1 07/28/2016   CL 99 07/06/2016   CO2 26 07/28/2016   GLUCOSE 168 (H) 07/28/2016   CREATININE 1.2 (H) 07/28/2016   CALCIUM 10.3 07/28/2016      RADIOGRAPHY: Nm Pet Image Initial (pi) Skull Base To Thigh  Result Date: 07/05/2016 CLINICAL DATA:  Initial treatment strategy for lung mass.  EXAM: NUCLEAR MEDICINE PET SKULL BASE TO THIGH TECHNIQUE: 13.14 mCi F-18 FDG was injected intravenously. Full-ring PET imaging was performed from the skull base to thigh after the radiotracer. CT data was obtained and used for attenuation correction and anatomic localization. FASTING BLOOD GLUCOSE:  Value: 151 mg/dl COMPARISON:  Chest CT 06/28/2016 FINDINGS: NECK No hypermetabolic lymph nodes in the neck. CHEST Persistent irregular 2.3 cm right lower lobe pulmonary lesion is weakly hypermetabolic with SUV max of 2.8. 2.4 cm subcarinal nodal lesion is hypermetabolic with SUV max of 8.6. There are also hypermetabolic right hilar lymph nodes with SUV max of 4.9 and 3.6. A few small scattered pulmonary nodular stable and 2 small on accurately characterize on CT. Stable aortic and coronary artery calcifications. ABDOMEN/PELVIS No abnormal hypermetabolic activity within the liver, pancreas, adrenal glands, or spleen. No hypermetabolic lymph nodes in the abdomen or pelvis. SKELETON No focal hypermetabolic activity to suggest skeletal metastasis. IMPRESSION: 1. Weakly hypermetabolic 2.3 cm right lower lobe pulmonary lesion. This is still suspicious for primary lung neoplasm given the more metabolically active right hilar and subcarinal lymph nodes. 2. No findings for metastatic disease involving the back, abdomen/pelvis or bony structures. 3. Small pulmonary nodules are stable and indeterminate. Recommend attention on future scans. Electronically Signed   By: Marijo Sanes M.D.   On: 07/05/2016 14:46   Dg Chest Port 1 View  Result Date: 07/11/2016 CLINICAL DATA:  Post bronchoscopy EXAM: PORTABLE CHEST 1 VIEW COMPARISON:  04/12/2016.  PET CT 07/05/2016 FINDINGS: Airspace opacities noted in the lower lobes bilaterally, likely related to bronchoscopy. No visible pneumothorax. Heart is borderline in size. No visible effusions or acute bony abnormality. IMPRESSION: Bilateral lower lobe airspace opacities, likely related to  bronchoscopy. No pneumothorax. Electronically Signed   By: Rolm Baptise M.D.   On: 07/11/2016 09:33      IMPRESSION: Stage III-A adenocarcinoma of lung with primary site presenting in the right lower lung area.   The patient would be a good candidate for a definitive course of radiation therapy directed the chest along with radiosensitizing chemotherapy. I discussed course of treatment side effects and potential long-term toxicities of chest radiation therapy with the patient and her family. She appears to understand wishes to proceed with planned course of treatment.  PLAN: The patient is scheduled for a MRI of the brain to complete staging workup.  She is scheduled for simulation Monday, 08/01/2016 at 8:00am. A consent form was obtained. Treatment to begin in July 16 or 17th. Anticipate 6 weeks of radiation therapy.     ------------------------------------------------  Blair Promise, PhD, MD   This document  serves as a record of services personally performed by Gery Pray, MD. It was created on his behalf by Valeta Harms, a trained medical scribe. The creation of this record is based on the scribe's personal observations and the provider's statements to them. This document has been checked and approved by the attending provider.

## 2016-07-28 NOTE — Progress Notes (Signed)
[place] Telephone:(336) [phone]   Fax:([phone] Multidisciplinary thoracic oncology clinic  CONSULT NOTE  REFERRING PHYSICIAN: Dr. Marshell Garfinkel  REASON FOR CONSULTATION:  76 years old Asian female recently diagnosed with lung cancer.  HPI Nicole [name] is a 76 y.o. female never smoker with past medical history significant for uterine cancer diagnosed 14 years ago status post hysterectomy, history of diabetes mellitus, hypertension, with arthritis, chronic low back pain. The patient mentioned that she had intermittent cough for the last 2 years. Chest x-ray was performed and was unremarkable. She was taking care of her elderly mother in [place]. She had abdominal pain during her stay in [place] and she was diagnosed with gallbladder stone and had cholecystectomy done. Imaging studies at that time showed questionable cyst in the pancreas. She was advised to have further investigation when she returns back to [place]. 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 Follow-up MRI of the abdomen with and without IV contrast was recommended to be performed every 2 years for a total of 10 years. The MRI of the abdomen also showed incompletely evaluated no Dulera opacity in the posterior right lung. This was followed by CT scan of the chest without contrast on 06/28/2016 and it showed 3.2 cm irregular mass posteriorly in the right lower lobe concerning for malignancy. There were also immunostain millimeter nodule in the right middle lobe concerning for metastatic disease as well as subcarinal adenopathy concerning for metastatic disease. The patient was referred to Dr.  [name]. A PET scan was performed on 07/05/2016 and it showed persistent irregular 2.3 cm right lower lobe pulmonary lesion weekly hypermetabolic with SUV max of 2.8. There was also 2.4 cm subcarinal nodal lesion that was hypermetabolic with SUV max of 8.6 and there was also hypermetabolic right hilar lymph nodes with SUV max of 4.9 and 3.6. The scan also showed few small scattered pulmonary nodular stable and too small to characterize by PET scan. There is no findings of metastatic disease involving the back, abdomen/pelvis or bony structures. On 07/11/2016 the patient underwent flexible video fiberoptic bronchoscopy with endobronchial ultrasound and biopsies. The final cytology ([id number]) showed non-small cell lung cancer and the immunohistochemical stain showed the tumor cells were positive for TTF-1 and essentially negative for cytokeratin 5/6. The immunophenotype is most consistent with adenocarcinoma. Dr. [name] kindly referred the patient to the multidisciplinary thoracic oncology clinic today for evaluation and discussion of her treatment options. 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 She also lost few pounds recently. She denied having any chest pain, shortness of breath but has mild cough with no hemoptysis. She denied having any nausea, vomiting, diarrhea or constipation. She has intermittent headache with no visual changes. Family history significant for father with heart disease and pancreatic cancer, mother had stomach cancer as well as hypertension. The patient is married and her husband is dealing with gastric cancer. She has 3 children. She was accompanied today by her daughter Nicole [name]. She is currently retired and used to on [employer]. She has no history of smoking, alcohol or drug abuse.  HPI  Past Medical History:  Diagnosis Date  . Bronchitis    hx of  . Cancer [facility])  2004   uterine/cervical  . Diabetes mellitus    type 2  . Gallstones   . Headache   . Hypertension   . Low back pain   . Lung mass    with lymphadenopathy  . Osteoarthritis     Past Surgical History:  Procedure Laterality Date  . ABDOMINAL HYSTERECTOMY  2004  . APPENDECTOMY  when 76 years old  . COLONOSCOPY  08-05-09   [name]  . INCONTINENCE SURGERY    . KNEE ARTHROSCOPY Left   . LEFT HEART CATHETERIZATION WITH CORONARY ANGIOGRAM N/A 07/19/2013   Procedure: LEFT HEART CATHETERIZATION WITH CORONARY ANGIOGRAM;  Surgeon: Sinclair Grooms, MD;  Location: [facility] CATH LAB;  Service: Cardiovascular;  Laterality: N/A;  . POLYPECTOMY  08-05-09   2 polyps  . TOTAL KNEE ARTHROPLASTY Left 03/28/2012   Procedure: LEFT TOTAL KNEE ARTHROPLASTY;  Surgeon: Tobi Bastos, MD;  Location: [facility] ORS;  Service: Orthopedics;  Laterality: Left;  . TUBAL LIGATION    . VIDEO BRONCHOSCOPY WITH ENDOBRONCHIAL ULTRASOUND N/A 07/11/2016   Procedure: VIDEO BRONCHOSCOPY WITH ENDOBRONCHIAL ULTRASOUND;  Surgeon: Marshell Garfinkel, MD;  Location: [place];  Service: Pulmonary;  Laterality: N/A;    Family History  Problem Relation Age of Onset  . Heart attack Father   . Heart disease Father   . Arthritis Other   . Cancer Other        colon, lst degree relative  . Diabetes Other        st degree relative  . Hyperlipidemia Other   . Hypertension Other   . Colon cancer Neg Hx     Social History Social History  Substance Use Topics  . Smoking status: Never Smoker  . Smokeless tobacco: Never Used  . Alcohol use No    Allergies  Allergen Reactions  . No Known Allergies     Current Outpatient Prescriptions  Medication Sig Dispense Refill  . allopurinol (ZYLOPRIM) 100 MG tablet take 1 tablet by mouth once daily 90 tablet 1  . CALCIUM-VITAMIN D PO Take 1 tablet by mouth daily.     . Cholecalciferol (VITAMIN D3 PO) Take 1 tablet by mouth daily.    . Cyanocobalamin (VITAMIN B-12 CR PO) Take 250 mcg by mouth  daily.     [name] FREESTYLE LITE test strip TEST once daily as directed 100 each 11  . losartan (COZAAR) 100 MG tablet take 1 tablet by mouth once daily 90 tablet 3  . metFORMIN (GLUCOPHAGE-XR) 750 MG 24 hr tablet take 2 tablets by mouth once daily with BREAKFAST 180 tablet 1  . Misc Natural Products (OSTEO BI-FLEX TRIPLE STRENGTH) TABS Take 1 tablet by mouth daily. Triple Action Joint Health by Joline Salt    . mometasone-formoterol (DULERA) 100-5 MCG/ACT AERO Inhale 2 puffs into the lungs 2 times daily at 12 noon and 4 pm. (Patient not taking: Reported on 07/06/2016) 1 Inhaler 1  . Multiple Vitamins-Minerals (MULTIVITAMIN WITH MINERALS) tablet Take 1 tablet by mouth daily.    . nebivolol (BYSTOLIC) 10 MG tablet Take 1 tablet (10 mg total) by mouth daily. 119 tablet 0  . omega-3 acid ethyl esters (LOVAZA) 1 g capsule Take 2 capsules (2 g total) by mouth 2 (two) times daily. (Patient not taking: Reported on 07/07/2016) 120 capsule 11  . Turmeric 450 MG CAPS Take 900 mg by mouth daily.      No  current facility-administered medications for this visit.     Review of Systems  Constitutional: positive for weight loss Eyes: negative Ears, nose, mouth, throat, and face: negative Respiratory: positive for cough Cardiovascular: negative Gastrointestinal: negative Genitourinary:negative Integument/breast: negative Hematologic/lymphatic: negative Musculoskeletal:positive for back pain Neurological: negative Behavioral/Psych: negative Endocrine: negative Allergic/Immunologic: negative  Physical Exam  FUX:NATFT, healthy, no distress, well nourished and well developed SKIN: skin color, texture, turgor are normal, no rashes or significant lesions HEAD: Normocephalic, No masses, lesions, tenderness or abnormalities EYES: normal, PERRLA, Conjunctiva are pink and non-injected EARS: External ears normal, Canals clear OROPHARYNX:no exudate, no erythema and lips, buccal mucosa, and tongue normal  NECK:  supple, no adenopathy, no JVD LYMPH:  no palpable lymphadenopathy, no hepatosplenomegaly BREAST:not examined LUNGS: clear to auscultation , and palpation HEART: regular rate & rhythm, no murmurs and no gallops ABDOMEN:abdomen soft, non-tender, normal bowel sounds and no masses or organomegaly BACK: Back symmetric, no curvature., No CVA tenderness EXTREMITIES:no joint deformities, effusion, or inflammation, no edema, no skin discoloration  NEURO: alert & oriented x 3 with fluent speech, no focal motor/sensory deficits  PERFORMANCE STATUS: ECOG 1  LABORATORY DATA: Lab Results  Component Value Date   WBC 5.3 07/28/2016   HGB 14.1 07/28/2016   HCT 42.2 07/28/2016   MCV 90.8 07/28/2016   PLT 220 07/28/2016      Chemistry      Component Value Date/Time   NA 136 07/06/2016 1535   K 4.0 07/06/2016 1535   CL 99 07/06/2016 1535   CO2 30 07/06/2016 1535   BUN 22 07/06/2016 1535   CREATININE 1.12 07/06/2016 1535      Component Value Date/Time   CALCIUM 10.0 07/06/2016 1535   ALKPHOS 56 06/14/2016 1356   AST 22 06/14/2016 1356   ALT 25 06/14/2016 1356   BILITOT 0.6 06/14/2016 1356       RADIOGRAPHIC STUDIES: Nm Pet Image Initial (pi) Skull Base To Thigh  Result Date: 07/05/2016 CLINICAL DATA:  Initial treatment strategy for lung mass. EXAM: NUCLEAR MEDICINE PET SKULL BASE TO THIGH TECHNIQUE: 13.14 mCi F-18 FDG was injected intravenously. Full-ring PET imaging was performed from the skull base to thigh after the radiotracer. CT data was obtained and used for attenuation correction and anatomic localization. FASTING BLOOD GLUCOSE:  Value: 151 mg/dl COMPARISON:  Chest CT 06/28/2016 FINDINGS: NECK No hypermetabolic lymph nodes in the neck. CHEST Persistent irregular 2.3 cm right lower lobe pulmonary lesion is weakly hypermetabolic with SUV max of 2.8. 2.4 cm subcarinal nodal lesion is hypermetabolic with SUV max of 8.6. There are also hypermetabolic right hilar lymph nodes with SUV max of  4.9 and 3.6. A few small scattered pulmonary nodular stable and 2 small on accurately characterize on CT. Stable aortic and coronary artery calcifications. ABDOMEN/PELVIS No abnormal hypermetabolic activity within the liver, pancreas, adrenal glands, or spleen. No hypermetabolic lymph nodes in the abdomen or pelvis. SKELETON No focal hypermetabolic activity to suggest skeletal metastasis. IMPRESSION: 1. Weakly hypermetabolic 2.3 cm right lower lobe pulmonary lesion. This is still suspicious for primary lung neoplasm given the more metabolically active right hilar and subcarinal lymph nodes. 2. No findings for metastatic disease involving the back, abdomen/pelvis or bony structures. 3. Small pulmonary nodules are stable and indeterminate. Recommend attention on future scans. Electronically Signed   By: Marijo Sanes M.D.   On: 07/05/2016 14:46   Dg Chest Port 1 View  Result Date: 07/11/2016 CLINICAL DATA:  Post bronchoscopy EXAM: PORTABLE CHEST 1 VIEW COMPARISON:  04/12/2016.  PET CT 07/05/2016 FINDINGS: Airspace opacities noted in the lower lobes bilaterally, likely related to bronchoscopy. No visible pneumothorax. Heart is borderline in size. No visible effusions or acute bony abnormality. IMPRESSION: Bilateral lower lobe airspace opacities, likely related to bronchoscopy. No pneumothorax. Electronically Signed   By: Rolm Baptise M.D.   On: 07/11/2016 09:33    ASSESSMENT: This is a very pleasant 76 years old Asian female, never smoker recently diagnosed with stage IIIa (T1b, N2, M0) non-small cell lung cancer, adenocarcinoma presented with right lower lobe lung mass in addition to right hilar and subcarinal lymphadenopathy diagnosed in June 2018.   PLAN: I had a lengthy discussion with the patient and her family today about her current disease stage, prognosis and treatment options. I personally and independently reviewed the scan images and discuss the results with the patient and her family and showed  them the images of the PET scan. I recommended for the patient to complete the staging workup by ordering a MRI of the brain to rule out brain metastasis. I will also send the tissue block for molecular study and PDL 1 expression. If there is insufficient material for molecular studies, I would consider the patient for biopsy of the right lower lobe lung mass. I discussed with the patient her treatment options and I recommended for her a course of concurrent chemoradiation. I recommended for the patient a course of concurrent chemoradiation with weekly carboplatin for AUC of 2 and paclitaxel 45 MG/M2. This would be followed by consolidation treatment with immunotherapy with Imfinzi (Durvalumab) of the patient has no evidence for disease progression. I discussed with the patient adverse effect of the chemotherapy including but not limited to alopecia, myelosuppression, nausea and vomiting, peripheral neuropathy, liver or renal dysfunction. I will arrange for the patient to have a chemotherapy education class before starting the first dose of his chemotherapy. I will call her pharmacy with prescription for Compazine 10 mg by mouth every 6 hours as needed for nausea. She is expected to start the first dose of her treatment on 08/08/2016. The patient would see Dr. Sondra Come later today for evaluation and discussion of the radiotherapy option. She was seen during the multidisciplinary thoracic oncology clinic today by medical oncology, radiation oncology, thoracic navigator and physical therapist. I will see the patient back for follow-up visit in 3 weeks for reevaluation and management of any adverse effect of her treatment. I gave the patient and her family the time to ask questions and I answered them completely to their satisfactions. She was advised to call immediately if she has any concerning symptoms in the interval. The patient voices understanding of current disease status and treatment options and is  in agreement with the current care plan. All questions were answered. The patient knows to call the clinic with any problems, questions or concerns. We can certainly see the patient much sooner if necessary.  Thank you so much for allowing me to participate in the care of Nicole [name]. I will continue to follow up the patient with you and assist in her care.  I spent 55 minutes counseling the patient face to face. The total time spent in the appointment was 80 minutes.  Disclaimer: This note was dictated with voice recognition software. Similar sounding words can inadvertently be transcribed and may not be corrected upon review.   Katelyne Galster K. July 28, 2016, 2:25 PM

## 2016-08-01 ENCOUNTER — Ambulatory Visit
Admission: RE | Admit: 2016-08-01 | Discharge: 2016-08-01 | Disposition: A | Discharge status: Home / Self Care | Payer: Medicare Other | Source: Ambulatory Visit | Attending: Radiation Oncology | Admitting: Radiation Oncology

## 2016-08-01 ENCOUNTER — Encounter: Payer: Self-pay | Admitting: *Deleted

## 2016-08-01 ENCOUNTER — Other Ambulatory Visit: Payer: Self-pay | Admitting: Medical Oncology

## 2016-08-01 ENCOUNTER — Other Ambulatory Visit: Payer: Medicare Other

## 2016-08-01 ENCOUNTER — Other Ambulatory Visit: Payer: Self-pay | Admitting: Internal Medicine

## 2016-08-01 DIAGNOSIS — C3491 Malignant neoplasm of unspecified part of right bronchus or lung: Secondary | ICD-10-CM

## 2016-08-03 ENCOUNTER — Telehealth: Payer: Self-pay | Admitting: Oncology

## 2016-08-03 ENCOUNTER — Telehealth: Payer: Self-pay | Admitting: Medical Oncology

## 2016-08-03 NOTE — Telephone Encounter (Signed)
Reports pt has decided to " take chemo and radiation , not immunotherapy". She is getting simulated tomorrow. Does [name] want to see her ? Chemo radiation scheduled to start 7/16.

## 2016-08-03 NOTE — Telephone Encounter (Signed)
She should already have follow up scheduled later. I order it when I saw her.No need to see her sooner.

## 2016-08-03 NOTE — Telephone Encounter (Signed)
Patient called and said she has discussed her treatment options with family and friends and has decided to have the original plan - chemotherapy with radiation instead of waiting to see about Immunotherapy.  She would like to reschedule her [place] appointment and also would like [place] to contact Dr. Worthy Flank office.  Dr. Sondra Come was notified and CT SIM has been scheduled for 9 am tomorrow.  Notified patient of time.  Also called Abelina Bachelor, RN with Dr. Worthy Flank office who will notify Dr. Julien Nordmann about seeing patient tomorrow during Thoracic clinic.

## 2016-08-04 ENCOUNTER — Ambulatory Visit
Admission: RE | Admit: 2016-08-04 | Discharge: 2016-08-04 | Disposition: A | Discharge status: Home / Self Care | Payer: Medicare Other | Source: Ambulatory Visit | Attending: Radiation Oncology | Admitting: Radiation Oncology

## 2016-08-04 DIAGNOSIS — R1310 Dysphagia, unspecified: Secondary | ICD-10-CM | POA: Diagnosis not present

## 2016-08-04 DIAGNOSIS — Z9049 Acquired absence of other specified parts of digestive tract: Secondary | ICD-10-CM | POA: Diagnosis not present

## 2016-08-04 DIAGNOSIS — R590 Localized enlarged lymph nodes: Secondary | ICD-10-CM | POA: Diagnosis not present

## 2016-08-04 DIAGNOSIS — C3491 Malignant neoplasm of unspecified part of right bronchus or lung: Secondary | ICD-10-CM | POA: Diagnosis not present

## 2016-08-04 DIAGNOSIS — M545 Low back pain: Secondary | ICD-10-CM | POA: Diagnosis not present

## 2016-08-04 DIAGNOSIS — Z8249 Family history of ischemic heart disease and other diseases of the circulatory system: Secondary | ICD-10-CM | POA: Diagnosis not present

## 2016-08-04 DIAGNOSIS — Z9889 Other specified postprocedural states: Secondary | ICD-10-CM | POA: Diagnosis not present

## 2016-08-04 DIAGNOSIS — Z7984 Long term (current) use of oral hypoglycemic drugs: Secondary | ICD-10-CM | POA: Diagnosis not present

## 2016-08-04 DIAGNOSIS — R05 Cough: Secondary | ICD-10-CM | POA: Diagnosis not present

## 2016-08-04 DIAGNOSIS — K8010 Calculus of gallbladder with chronic cholecystitis without obstruction: Secondary | ICD-10-CM | POA: Diagnosis not present

## 2016-08-04 DIAGNOSIS — Z809 Family history of malignant neoplasm, unspecified: Secondary | ICD-10-CM | POA: Diagnosis not present

## 2016-08-04 DIAGNOSIS — E119 Type 2 diabetes mellitus without complications: Secondary | ICD-10-CM | POA: Diagnosis not present

## 2016-08-04 DIAGNOSIS — Z833 Family history of diabetes mellitus: Secondary | ICD-10-CM | POA: Diagnosis not present

## 2016-08-04 DIAGNOSIS — C3431 Malignant neoplasm of lower lobe, right bronchus or lung: Secondary | ICD-10-CM | POA: Diagnosis not present

## 2016-08-04 DIAGNOSIS — Z7901 Long term (current) use of anticoagulants: Secondary | ICD-10-CM | POA: Diagnosis not present

## 2016-08-04 DIAGNOSIS — Z8261 Family history of arthritis: Secondary | ICD-10-CM | POA: Diagnosis not present

## 2016-08-04 DIAGNOSIS — I10 Essential (primary) hypertension: Secondary | ICD-10-CM | POA: Diagnosis not present

## 2016-08-04 DIAGNOSIS — Z96652 Presence of left artificial knee joint: Secondary | ICD-10-CM | POA: Diagnosis not present

## 2016-08-04 DIAGNOSIS — Z79899 Other long term (current) drug therapy: Secondary | ICD-10-CM | POA: Diagnosis not present

## 2016-08-04 DIAGNOSIS — Z510 Encounter for antineoplastic radiation therapy: Secondary | ICD-10-CM | POA: Diagnosis not present

## 2016-08-04 DIAGNOSIS — K862 Cyst of pancreas: Secondary | ICD-10-CM | POA: Diagnosis not present

## 2016-08-04 DIAGNOSIS — R0602 Shortness of breath: Secondary | ICD-10-CM | POA: Diagnosis not present

## 2016-08-04 NOTE — Progress Notes (Signed)
  Radiation Oncology         (336) [phone] ________________________________  Name: Nicole [name] MRN: [id number]  Date: 08/04/2016  DOB: 1940/03/03  SIMULATION AND TREATMENT PLANNING NOTE    ICD-10-CM   1. Adenocarcinoma of right lung, stage 3 (HCC) C34.91     DIAGNOSIS:  Stage III-A adenocarcinoma of lung with primary site presenting in the right lower lung area  NARRATIVE:  The patient was brought to the [place].  Identity was confirmed.  All relevant records and images related to the planned course of therapy were reviewed.  The patient freely provided informed written consent to proceed with treatment after reviewing the details related to the planned course of therapy. The consent form was witnessed and verified by the simulation staff.  Then, the patient was set-up in a stable reproducible  supine position for radiation therapy.  CT images were obtained.  Surface markings were placed.  The CT images were loaded into the planning software.  Then the target and avoidance structures were contoured.  Treatment planning then occurred.  The radiation prescription was entered and confirmed.  Then, I designed and supervised the construction of a total of five medically necessary complex treatment devices.  I have requested : 3D Simulation  I have requested a DVH of the following structures: GTV, PTV, heart, lungs, and spinal cord.  I have ordered:dose calc  PLAN:  The patient will receive 60 Gy in 30 fractions Along with radiosensitizing chemotherapy.   Special Treatment Procedure Note: The patient will be receiving radiosensitizing chemotherapy. Given the potential of increased toxicities related to combined therapy and the necessity for close monitoring of the patient and blood work, this constitutes a special treatment procedure.  -----------------------------------  Blair Promise, PhD, MD

## 2016-08-05 ENCOUNTER — Ambulatory Visit (HOSPITAL_COMMUNITY): Admission: RE | Admit: 2016-08-05 | Payer: Medicare Other | Source: Ambulatory Visit

## 2016-08-06 ENCOUNTER — Ambulatory Visit (HOSPITAL_COMMUNITY)
Admission: RE | Admit: 2016-08-06 | Discharge: 2016-08-06 | Disposition: A | Discharge status: Home / Self Care | Payer: Medicare Other | Source: Ambulatory Visit | Attending: Internal Medicine | Admitting: Internal Medicine

## 2016-08-06 DIAGNOSIS — C3491 Malignant neoplasm of unspecified part of right bronchus or lung: Secondary | ICD-10-CM

## 2016-08-06 DIAGNOSIS — C3490 Malignant neoplasm of unspecified part of unspecified bronchus or lung: Secondary | ICD-10-CM | POA: Diagnosis not present

## 2016-08-06 MED ORDER — GADOBENATE DIMEGLUMINE 529 MG/ML IV SOLN
20.0000 mL | Freq: Once | INTRAVENOUS | Status: AC | PRN
  Administered 2016-08-06: 17 mL via INTRAVENOUS

## 2016-08-08 ENCOUNTER — Ambulatory Visit (HOSPITAL_BASED_OUTPATIENT_CLINIC_OR_DEPARTMENT_OTHER): Payer: Medicare Other

## 2016-08-08 ENCOUNTER — Ambulatory Visit: Payer: Medicare Other | Admitting: Radiation Oncology

## 2016-08-08 ENCOUNTER — Institutional Professional Consult (permissible substitution): Payer: Medicare Other | Admitting: Internal Medicine

## 2016-08-08 ENCOUNTER — Other Ambulatory Visit (HOSPITAL_BASED_OUTPATIENT_CLINIC_OR_DEPARTMENT_OTHER): Payer: Medicare Other

## 2016-08-08 VITALS — BP 156/94 | HR 70 | Temp 98.3°F | Resp 16

## 2016-08-08 DIAGNOSIS — C3491 Malignant neoplasm of unspecified part of right bronchus or lung: Secondary | ICD-10-CM

## 2016-08-08 DIAGNOSIS — Z510 Encounter for antineoplastic radiation therapy: Secondary | ICD-10-CM | POA: Diagnosis not present

## 2016-08-08 DIAGNOSIS — C3431 Malignant neoplasm of lower lobe, right bronchus or lung: Secondary | ICD-10-CM | POA: Diagnosis not present

## 2016-08-08 DIAGNOSIS — Z5111 Encounter for antineoplastic chemotherapy: Secondary | ICD-10-CM

## 2016-08-08 LAB — COMPREHENSIVE METABOLIC PANEL
ALT: 24 U/L (ref 0–55)
AST: 19 U/L (ref 5–34)
Albumin: 3.4 g/dL — ABNORMAL LOW (ref 3.5–5.0)
Alkaline Phosphatase: 72 U/L (ref 40–150)
Anion Gap: 12 mEq/L — ABNORMAL HIGH (ref 3–11)
BUN: 21.1 mg/dL (ref 7.0–26.0)
CO2: 21 mEq/L — ABNORMAL LOW (ref 22–29)
Calcium: 9.3 mg/dL (ref 8.4–10.4)
Chloride: 105 mEq/L (ref 98–109)
Creatinine: 1.1 mg/dL (ref 0.6–1.1)
EGFR: 49 mL/min/{1.73_m2} — ABNORMAL LOW (ref >90)
Glucose: 229 mg/dl — ABNORMAL HIGH (ref 70–140)
Potassium: 3.8 mEq/L (ref 3.5–5.1)
Sodium: 138 mEq/L (ref 136–145)
Total Bilirubin: 0.83 mg/dL (ref 0.20–1.20)
Total Protein: 7.2 g/dL (ref 6.4–8.3)

## 2016-08-08 LAB — CBC WITH DIFFERENTIAL/PLATELET
BASO%: 0.4 % (ref 0.0–2.0)
Basophils Absolute: 0 10*3/uL (ref 0.0–0.1)
EOS%: 3.6 % (ref 0.0–7.0)
Eosinophils Absolute: 0.2 10*3/uL (ref 0.0–0.5)
HCT: 41.3 % (ref 34.8–46.6)
HGB: 13.7 g/dL (ref 11.6–15.9)
LYMPH%: 30.3 % (ref 14.0–49.7)
MCH: 30 pg (ref 25.1–34.0)
MCHC: 33.2 g/dL (ref 31.5–36.0)
MCV: 90.6 fL (ref 79.5–101.0)
MONO#: 0.4 10*3/uL (ref 0.1–0.9)
MONO%: 8.5 % (ref 0.0–14.0)
NEUT#: 2.7 10*3/uL (ref 1.5–6.5)
NEUT%: 57.2 % (ref 38.4–76.8)
Platelets: 209 10*3/uL (ref 145–400)
RBC: 4.56 10*6/uL (ref 3.70–5.45)
RDW: 12.9 % (ref 11.2–14.5)
WBC: 4.7 10*3/uL (ref 3.9–10.3)
lymph#: 1.4 10*3/uL (ref 0.9–3.3)

## 2016-08-08 MED ORDER — ONDANSETRON HCL 4 MG/2ML IJ SOLN
INTRAMUSCULAR | Status: AC
  Filled 2016-08-08: qty 4

## 2016-08-08 MED ORDER — PALONOSETRON HCL INJECTION 0.25 MG/5ML
0.2500 mg | Freq: Once | INTRAVENOUS | Status: AC
  Administered 2016-08-08: 0.25 mg via INTRAVENOUS

## 2016-08-08 MED ORDER — SODIUM CHLORIDE 0.9 % IV SOLN
20.0000 mg | Freq: Once | INTRAVENOUS | Status: AC
  Administered 2016-08-08: 20 mg via INTRAVENOUS
  Filled 2016-08-08: qty 2

## 2016-08-08 MED ORDER — DIPHENHYDRAMINE HCL 50 MG/ML IJ SOLN
50.0000 mg | Freq: Once | INTRAMUSCULAR | Status: AC
  Administered 2016-08-08: 50 mg via INTRAVENOUS

## 2016-08-08 MED ORDER — SODIUM CHLORIDE 0.9% FLUSH
10.0000 mL | INTRAVENOUS | Status: DC | PRN
  Filled 2016-08-08: qty 10

## 2016-08-08 MED ORDER — DIPHENHYDRAMINE HCL 50 MG/ML IJ SOLN
INTRAMUSCULAR | Status: AC
  Filled 2016-08-08: qty 1

## 2016-08-08 MED ORDER — FAMOTIDINE IN NACL 20-0.9 MG/50ML-% IV SOLN
20.0000 mg | Freq: Once | INTRAVENOUS | Status: AC
  Administered 2016-08-08: 20 mg via INTRAVENOUS

## 2016-08-08 MED ORDER — FAMOTIDINE IN NACL 20-0.9 MG/50ML-% IV SOLN
INTRAVENOUS | Status: AC
  Filled 2016-08-08: qty 50

## 2016-08-08 MED ORDER — METHYLPREDNISOLONE SODIUM SUCC 125 MG IJ SOLR
125.0000 mg | Freq: Once | INTRAMUSCULAR | Status: AC | PRN
  Administered 2016-08-08: 125 mg via INTRAVENOUS

## 2016-08-08 MED ORDER — SODIUM CHLORIDE 0.9 % IV SOLN
Freq: Once | INTRAVENOUS | Status: AC
  Administered 2016-08-08: 08:00:00 via INTRAVENOUS

## 2016-08-08 MED ORDER — DEXAMETHASONE SODIUM PHOSPHATE 10 MG/ML IJ SOLN
INTRAMUSCULAR | Status: AC
  Filled 2016-08-08: qty 1

## 2016-08-08 MED ORDER — HEPARIN SOD (PORK) LOCK FLUSH 100 UNIT/ML IV SOLN
500.0000 [IU] | Freq: Once | INTRAVENOUS | Status: DC | PRN
  Filled 2016-08-08: qty 5

## 2016-08-08 MED ORDER — SODIUM CHLORIDE 0.9 % IV SOLN
45.0000 mg/m2 | Freq: Once | INTRAVENOUS | Status: AC
  Administered 2016-08-08: 90 mg via INTRAVENOUS
  Filled 2016-08-08: qty 15

## 2016-08-08 MED ORDER — PALONOSETRON HCL INJECTION 0.25 MG/5ML
INTRAVENOUS | Status: AC
  Filled 2016-08-08: qty 5

## 2016-08-08 MED ORDER — SODIUM CHLORIDE 0.9 % IV SOLN
159.6000 mg | Freq: Once | INTRAVENOUS | Status: AC
  Administered 2016-08-08: 160 mg via INTRAVENOUS
  Filled 2016-08-08: qty 16

## 2016-08-08 NOTE — Patient Instructions (Addendum)
[facility] Discharge Instructions for Patients Receiving Chemotherapy  Today you received the following chemotherapy agents: Taxol and Carboplatin.  To help prevent nausea and vomiting after your treatment, we encourage you to take your nausea medication as directed   If you develop nausea and vomiting that is not controlled by your nausea medication, call the clinic.   BELOW ARE SYMPTOMS THAT SHOULD BE REPORTED IMMEDIATELY:  *FEVER GREATER THAN 100.5 F  *CHILLS WITH OR WITHOUT FEVER  NAUSEA AND VOMITING THAT IS NOT CONTROLLED WITH YOUR NAUSEA MEDICATION  *UNUSUAL SHORTNESS OF BREATH  *UNUSUAL BRUISING OR BLEEDING  TENDERNESS IN MOUTH AND THROAT WITH OR WITHOUT PRESENCE OF ULCERS  *URINARY PROBLEMS  *BOWEL PROBLEMS  UNUSUAL RASH Items with * indicate a potential emergency and should be followed up as soon as possible.  Feel free to call the clinic you have any questions or concerns. The clinic phone number is [phone].  Please show the [facility] at check-in to the Emergency Department and triage nurse.  Carboplatin injection What is this medicine? CARBOPLATIN (KAR boe pla tin) is a chemotherapy drug. It targets fast dividing cells, like cancer cells, and causes these cells to die. This medicine is used to treat ovarian cancer and many other cancers. This medicine may be used for other purposes; ask your health care provider or pharmacist if you have questions. COMMON BRAND NAME(S): Paraplatin What should I tell my health care provider before I take this medicine? They need to know if you have any of these conditions: -blood disorders -hearing problems -kidney disease -recent or ongoing radiation therapy -an unusual or allergic reaction to carboplatin, cisplatin, other chemotherapy, other medicines, foods, dyes, or preservatives -pregnant or trying to get pregnant -breast-feeding How should I use this medicine? This drug is usually given as  an infusion into a vein. It is administered in a hospital or clinic by a specially trained health care professional. Talk to your pediatrician regarding the use of this medicine in children. Special care may be needed. Overdosage: If you think you have taken too much of this medicine contact a poison control center or emergency room at once. NOTE: This medicine is only for you. Do not share this medicine with others. What if I miss a dose? It is important not to miss a dose. Call your doctor or health care professional if you are unable to keep an appointment. What may interact with this medicine? -medicines for seizures -medicines to increase blood counts like filgrastim, pegfilgrastim, sargramostim -some antibiotics like amikacin, gentamicin, neomycin, streptomycin, tobramycin -vaccines Talk to your doctor or health care professional before taking any of these medicines: -acetaminophen -aspirin -ibuprofen -ketoprofen -naproxen This list may not describe all possible interactions. Give your health care provider a list of all the medicines, herbs, non-prescription drugs, or dietary supplements you use. Also tell them if you smoke, drink alcohol, or use illegal drugs. Some items may interact with your medicine. What should I watch for while using this medicine? Your condition will be monitored carefully while you are receiving this medicine. You will need important blood work done while you are taking this medicine. This drug may make you feel generally unwell. This is not uncommon, as chemotherapy can affect healthy cells as well as cancer cells. Report any side effects. Continue your course of treatment even though you feel ill unless your doctor tells you to stop. In some cases, you may be given additional medicines to help with side effects. Follow  all directions for their use. Call your doctor or health care professional for advice if you get a fever, chills or sore throat, or other  symptoms of a cold or flu. Do not treat yourself. This drug decreases your body's ability to fight infections. Try to avoid being around people who are sick. This medicine may increase your risk to bruise or bleed. Call your doctor or health care professional if you notice any unusual bleeding. Be careful brushing and flossing your teeth or using a toothpick because you may get an infection or bleed more easily. If you have any dental work done, tell your dentist you are receiving this medicine. Avoid taking products that contain aspirin, acetaminophen, ibuprofen, naproxen, or ketoprofen unless instructed by your doctor. These medicines may hide a fever. Do not become pregnant while taking this medicine. Women should inform their doctor if they wish to become pregnant or think they might be pregnant. There is a potential for serious side effects to an unborn child. Talk to your health care professional or pharmacist for more information. Do not breast-feed an infant while taking this medicine. What side effects may I notice from receiving this medicine? Side effects that you should report to your doctor or health care professional as soon as possible: -allergic reactions like skin rash, itching or hives, swelling of the face, lips, or tongue -signs of infection - fever or chills, cough, sore throat, pain or difficulty passing urine -signs of decreased platelets or bleeding - bruising, pinpoint red spots on the skin, black, tarry stools, nosebleeds -signs of decreased red blood cells - unusually weak or tired, fainting spells, lightheadedness -breathing problems -changes in hearing -changes in vision -chest pain -high blood pressure -low blood counts - This drug may decrease the number of white blood cells, red blood cells and platelets. You may be at increased risk for infections and bleeding. -nausea and vomiting -pain, swelling, redness or irritation at the injection site -pain, tingling,  numbness in the hands or feet -problems with balance, talking, walking -trouble passing urine or change in the amount of urine Side effects that usually do not require medical attention (report to your doctor or health care professional if they continue or are bothersome): -hair loss -loss of appetite -metallic taste in the mouth or changes in taste This list may not describe all possible side effects. Call your doctor for medical advice about side effects. You may report side effects to FDA at 1-800-FDA-1088. Where should I keep my medicine? This drug is given in a hospital or clinic and will not be stored at home. NOTE: This sheet is a summary. It may not cover all possible information. If you have questions about this medicine, talk to your doctor, pharmacist, or health care provider.  2018 Elsevier/Gold Standard (2007-04-17 14:38:05)  Paclitaxel injection What is this medicine? PACLITAXEL (PAK li TAX el) is a chemotherapy drug. It targets fast dividing cells, like cancer cells, and causes these cells to die. This medicine is used to treat ovarian cancer, breast cancer, and other cancers. This medicine may be used for other purposes; ask your health care provider or pharmacist if you have questions. COMMON BRAND NAME(S): Onxol, Taxol What should I tell my health care provider before I take this medicine? They need to know if you have any of these conditions: -blood disorders -irregular heartbeat -infection (especially a virus infection such as chickenpox, cold sores, or herpes) -liver disease -previous or ongoing radiation therapy -an unusual or allergic reaction to  paclitaxel, alcohol, polyoxyethylated castor oil, other chemotherapy agents, other medicines, foods, dyes, or preservatives -pregnant or trying to get pregnant -breast-feeding How should I use this medicine? This drug is given as an infusion into a vein. It is administered in a hospital or clinic by a specially trained  health care professional. Talk to your pediatrician regarding the use of this medicine in children. Special care may be needed. Overdosage: If you think you have taken too much of this medicine contact a poison control center or emergency room at once. NOTE: This medicine is only for you. Do not share this medicine with others. What if I miss a dose? It is important not to miss your dose. Call your doctor or health care professional if you are unable to keep an appointment. What may interact with this medicine? Do not take this medicine with any of the following medications: -disulfiram -metronidazole This medicine may also interact with the following medications: -cyclosporine -diazepam -ketoconazole -medicines to increase blood counts like filgrastim, pegfilgrastim, sargramostim -other chemotherapy drugs like cisplatin, doxorubicin, epirubicin, etoposide, teniposide, vincristine -quinidine -testosterone -vaccines -verapamil Talk to your doctor or health care professional before taking any of these medicines: -acetaminophen -aspirin -ibuprofen -ketoprofen -naproxen This list may not describe all possible interactions. Give your health care provider a list of all the medicines, herbs, non-prescription drugs, or dietary supplements you use. Also tell them if you smoke, drink alcohol, or use illegal drugs. Some items may interact with your medicine. What should I watch for while using this medicine? Your condition will be monitored carefully while you are receiving this medicine. You will need important blood work done while you are taking this medicine. This medicine can cause serious allergic reactions. To reduce your risk you will need to take other medicine(s) before treatment with this medicine. If you experience allergic reactions like skin rash, itching or hives, swelling of the face, lips, or tongue, tell your doctor or health care professional right away. In some cases, you may  be given additional medicines to help with side effects. Follow all directions for their use. This drug may make you feel generally unwell. This is not uncommon, as chemotherapy can affect healthy cells as well as cancer cells. Report any side effects. Continue your course of treatment even though you feel ill unless your doctor tells you to stop. Call your doctor or health care professional for advice if you get a fever, chills or sore throat, or other symptoms of a cold or flu. Do not treat yourself. This drug decreases your body's ability to fight infections. Try to avoid being around people who are sick. This medicine may increase your risk to bruise or bleed. Call your doctor or health care professional if you notice any unusual bleeding. Be careful brushing and flossing your teeth or using a toothpick because you may get an infection or bleed more easily. If you have any dental work done, tell your dentist you are receiving this medicine. Avoid taking products that contain aspirin, acetaminophen, ibuprofen, naproxen, or ketoprofen unless instructed by your doctor. These medicines may hide a fever. Do not become pregnant while taking this medicine. Women should inform their doctor if they wish to become pregnant or think they might be pregnant. There is a potential for serious side effects to an unborn child. Talk to your health care professional or pharmacist for more information. Do not breast-feed an infant while taking this medicine. Men are advised not to father a child while receiving this  medicine. This product may contain alcohol. Ask your pharmacist or healthcare provider if this medicine contains alcohol. Be sure to tell all healthcare providers you are taking this medicine. Certain medicines, like metronidazole and disulfiram, can cause an unpleasant reaction when taken with alcohol. The reaction includes flushing, headache, nausea, vomiting, sweating, and increased thirst. The reaction can  last from 30 minutes to several hours. What side effects may I notice from receiving this medicine? Side effects that you should report to your doctor or health care professional as soon as possible: -allergic reactions like skin rash, itching or hives, swelling of the face, lips, or tongue -low blood counts - This drug may decrease the number of white blood cells, red blood cells and platelets. You may be at increased risk for infections and bleeding. -signs of infection - fever or chills, cough, sore throat, pain or difficulty passing urine -signs of decreased platelets or bleeding - bruising, pinpoint red spots on the skin, black, tarry stools, nosebleeds -signs of decreased red blood cells - unusually weak or tired, fainting spells, lightheadedness -breathing problems -chest pain -high or low blood pressure -mouth sores -nausea and vomiting -pain, swelling, redness or irritation at the injection site -pain, tingling, numbness in the hands or feet -slow or irregular heartbeat -swelling of the ankle, feet, hands Side effects that usually do not require medical attention (report to your doctor or health care professional if they continue or are bothersome): -bone pain -complete hair loss including hair on your head, underarms, pubic hair, eyebrows, and eyelashes -changes in the color of fingernails -diarrhea -loosening of the fingernails -loss of appetite -muscle or joint pain -red flush to skin -sweating This list may not describe all possible side effects. Call your doctor for medical advice about side effects. You may report side effects to FDA at 1-800-FDA-1088. Where should I keep my medicine? This drug is given in a hospital or clinic and will not be stored at home. NOTE: This sheet is a summary. It may not cover all possible information. If you have questions about this medicine, talk to your doctor, pharmacist, or health care provider.  2018 [employer]vier/Gold Standard (2014-11-11  19:58:00)

## 2016-08-08 NOTE — Progress Notes (Signed)
During Taxol tx, patient began c/o itching and hives. Infusion interrupted and medicated per hypersensitivity protocol. Dr. Earlie Server notified. Advised to rechallenge as tolerated. Restarted and completed infusion without further incident.

## 2016-08-09 ENCOUNTER — Ambulatory Visit
Admission: RE | Admit: 2016-08-09 | Discharge: 2016-08-09 | Disposition: A | Discharge status: Home / Self Care | Payer: Medicare Other | Source: Ambulatory Visit | Attending: Radiation Oncology | Admitting: Radiation Oncology

## 2016-08-09 ENCOUNTER — Inpatient Hospital Stay
Admission: RE | Admit: 2016-08-09 | Discharge: 2016-08-09 | Disposition: A | Discharge status: Home / Self Care | Payer: Self-pay | Source: Ambulatory Visit | Attending: Radiation Oncology | Admitting: Radiation Oncology

## 2016-08-09 ENCOUNTER — Ambulatory Visit: Payer: Medicare Other

## 2016-08-09 DIAGNOSIS — C3491 Malignant neoplasm of unspecified part of right bronchus or lung: Secondary | ICD-10-CM

## 2016-08-09 DIAGNOSIS — C3431 Malignant neoplasm of lower lobe, right bronchus or lung: Secondary | ICD-10-CM | POA: Diagnosis not present

## 2016-08-09 DIAGNOSIS — Z510 Encounter for antineoplastic radiation therapy: Secondary | ICD-10-CM | POA: Diagnosis not present

## 2016-08-09 MED ORDER — SONAFINE EX EMUL
1.0000 "application " | Freq: Once | CUTANEOUS | Status: AC
  Administered 2016-08-09: 1 via TOPICAL

## 2016-08-09 NOTE — Progress Notes (Signed)
Pt here for patient teaching.  Pt given Radiation and You booklet and Sonafine.  Reviewed areas of pertinence such as fatigue, skin changes and throat changes . Pt able to give teach back of to pat skin, use unscented/gentle soap and drink plenty of water,apply Sonafine bid and avoid applying anything to skin within 4 hours of treatment. Pt demonstrated understanding and verbalizes understanding of information given and will contact nursing with any questions or concerns.

## 2016-08-10 ENCOUNTER — Ambulatory Visit
Admission: RE | Admit: 2016-08-10 | Discharge: 2016-08-10 | Disposition: A | Discharge status: Home / Self Care | Payer: Medicare Other | Source: Ambulatory Visit | Attending: Radiation Oncology | Admitting: Radiation Oncology

## 2016-08-10 ENCOUNTER — Telehealth: Payer: Self-pay

## 2016-08-10 DIAGNOSIS — Z510 Encounter for antineoplastic radiation therapy: Secondary | ICD-10-CM | POA: Diagnosis not present

## 2016-08-10 DIAGNOSIS — C3431 Malignant neoplasm of lower lobe, right bronchus or lung: Secondary | ICD-10-CM | POA: Diagnosis not present

## 2016-08-10 NOTE — Telephone Encounter (Signed)
Chemo f/u phone call completed post taxol/carbo. Slight fatigue. No other complaints at this time.

## 2016-08-10 NOTE — Telephone Encounter (Signed)
Error

## 2016-08-11 ENCOUNTER — Telehealth: Payer: Self-pay | Admitting: Medical Oncology

## 2016-08-11 ENCOUNTER — Ambulatory Visit
Admission: RE | Admit: 2016-08-11 | Discharge: 2016-08-11 | Disposition: A | Discharge status: Home / Self Care | Payer: Medicare Other | Source: Ambulatory Visit | Attending: Radiation Oncology | Admitting: Radiation Oncology

## 2016-08-11 DIAGNOSIS — Z510 Encounter for antineoplastic radiation therapy: Secondary | ICD-10-CM | POA: Diagnosis not present

## 2016-08-11 DIAGNOSIS — C3431 Malignant neoplasm of lower lobe, right bronchus or lung: Secondary | ICD-10-CM | POA: Diagnosis not present

## 2016-08-11 NOTE — Telephone Encounter (Signed)
I left message to return chemo follow up call.

## 2016-08-12 ENCOUNTER — Ambulatory Visit
Admission: RE | Admit: 2016-08-12 | Discharge: 2016-08-12 | Disposition: A | Discharge status: Home / Self Care | Payer: Medicare Other | Source: Ambulatory Visit | Attending: Radiation Oncology | Admitting: Radiation Oncology

## 2016-08-12 DIAGNOSIS — C3431 Malignant neoplasm of lower lobe, right bronchus or lung: Secondary | ICD-10-CM | POA: Diagnosis not present

## 2016-08-12 DIAGNOSIS — Z510 Encounter for antineoplastic radiation therapy: Secondary | ICD-10-CM | POA: Diagnosis not present

## 2016-08-15 ENCOUNTER — Encounter: Payer: Self-pay | Admitting: Internal Medicine

## 2016-08-15 ENCOUNTER — Ambulatory Visit (HOSPITAL_BASED_OUTPATIENT_CLINIC_OR_DEPARTMENT_OTHER): Payer: Medicare Other | Admitting: Internal Medicine

## 2016-08-15 ENCOUNTER — Other Ambulatory Visit (HOSPITAL_BASED_OUTPATIENT_CLINIC_OR_DEPARTMENT_OTHER): Payer: Medicare Other

## 2016-08-15 ENCOUNTER — Ambulatory Visit (HOSPITAL_BASED_OUTPATIENT_CLINIC_OR_DEPARTMENT_OTHER): Payer: Medicare Other

## 2016-08-15 ENCOUNTER — Ambulatory Visit
Admission: RE | Admit: 2016-08-15 | Discharge: 2016-08-15 | Disposition: A | Discharge status: Home / Self Care | Payer: Medicare Other | Source: Ambulatory Visit | Attending: Radiation Oncology | Admitting: Radiation Oncology

## 2016-08-15 VITALS — BP 121/79 | HR 78 | Temp 97.7°F | Resp 18 | Ht 68.0 in | Wt 182.4 lb

## 2016-08-15 DIAGNOSIS — C3491 Malignant neoplasm of unspecified part of right bronchus or lung: Secondary | ICD-10-CM

## 2016-08-15 DIAGNOSIS — C3431 Malignant neoplasm of lower lobe, right bronchus or lung: Secondary | ICD-10-CM

## 2016-08-15 DIAGNOSIS — Z5111 Encounter for antineoplastic chemotherapy: Secondary | ICD-10-CM | POA: Diagnosis not present

## 2016-08-15 DIAGNOSIS — Z510 Encounter for antineoplastic radiation therapy: Secondary | ICD-10-CM | POA: Diagnosis not present

## 2016-08-15 LAB — COMPREHENSIVE METABOLIC PANEL
ALT: 24 U/L (ref 0–55)
AST: 17 U/L (ref 5–34)
Albumin: 3.4 g/dL — ABNORMAL LOW (ref 3.5–5.0)
Alkaline Phosphatase: 61 U/L (ref 40–150)
Anion Gap: 9 mEq/L (ref 3–11)
BUN: 24.2 mg/dL (ref 7.0–26.0)
CO2: 22 mEq/L (ref 22–29)
Calcium: 9.5 mg/dL (ref 8.4–10.4)
Chloride: 106 mEq/L (ref 98–109)
Creatinine: 1 mg/dL (ref 0.6–1.1)
EGFR: 56 mL/min/{1.73_m2} — ABNORMAL LOW (ref >90)
Glucose: 153 mg/dl — ABNORMAL HIGH (ref 70–140)
Potassium: 4.2 mEq/L (ref 3.5–5.1)
Sodium: 137 mEq/L (ref 136–145)
Total Bilirubin: 0.84 mg/dL (ref 0.20–1.20)
Total Protein: 7.1 g/dL (ref 6.4–8.3)

## 2016-08-15 LAB — CBC WITH DIFFERENTIAL/PLATELET
BASO%: 0.5 % (ref 0.0–2.0)
Basophils Absolute: 0 10*3/uL (ref 0.0–0.1)
EOS%: 2.6 % (ref 0.0–7.0)
Eosinophils Absolute: 0.1 10*3/uL (ref 0.0–0.5)
HCT: 40.9 % (ref 34.8–46.6)
HGB: 13.7 g/dL (ref 11.6–15.9)
LYMPH%: 24.1 % (ref 14.0–49.7)
MCH: 30.2 pg (ref 25.1–34.0)
MCHC: 33.5 g/dL (ref 31.5–36.0)
MCV: 90 fL (ref 79.5–101.0)
MONO#: 0.3 10*3/uL (ref 0.1–0.9)
MONO%: 8.4 % (ref 0.0–14.0)
NEUT#: 2.7 10*3/uL (ref 1.5–6.5)
NEUT%: 64.4 % (ref 38.4–76.8)
Platelets: 227 10*3/uL (ref 145–400)
RBC: 4.54 10*6/uL (ref 3.70–5.45)
RDW: 12.9 % (ref 11.2–14.5)
WBC: 4.1 10*3/uL (ref 3.9–10.3)
lymph#: 1 10*3/uL (ref 0.9–3.3)

## 2016-08-15 MED ORDER — SODIUM CHLORIDE 0.9 % IV SOLN
20.0000 mg | Freq: Once | INTRAVENOUS | Status: AC
  Administered 2016-08-15: 20 mg via INTRAVENOUS
  Filled 2016-08-15: qty 2

## 2016-08-15 MED ORDER — FAMOTIDINE IN NACL 20-0.9 MG/50ML-% IV SOLN
INTRAVENOUS | Status: AC
  Filled 2016-08-15: qty 50

## 2016-08-15 MED ORDER — DIPHENHYDRAMINE HCL 50 MG/ML IJ SOLN
INTRAMUSCULAR | Status: AC
  Filled 2016-08-15: qty 1

## 2016-08-15 MED ORDER — SODIUM CHLORIDE 0.9 % IV SOLN
159.6000 mg | Freq: Once | INTRAVENOUS | Status: AC
  Administered 2016-08-15: 160 mg via INTRAVENOUS
  Filled 2016-08-15: qty 16

## 2016-08-15 MED ORDER — DIPHENHYDRAMINE HCL 50 MG/ML IJ SOLN
50.0000 mg | Freq: Once | INTRAMUSCULAR | Status: AC
  Administered 2016-08-15: 50 mg via INTRAVENOUS

## 2016-08-15 MED ORDER — SODIUM CHLORIDE 0.9 % IV SOLN
Freq: Once | INTRAVENOUS | Status: AC
  Administered 2016-08-15: 11:00:00 via INTRAVENOUS

## 2016-08-15 MED ORDER — PALONOSETRON HCL INJECTION 0.25 MG/5ML
INTRAVENOUS | Status: AC
  Filled 2016-08-15: qty 5

## 2016-08-15 MED ORDER — SODIUM CHLORIDE 0.9 % IV SOLN
45.0000 mg/m2 | Freq: Once | INTRAVENOUS | Status: AC
  Administered 2016-08-15: 90 mg via INTRAVENOUS
  Filled 2016-08-15: qty 15

## 2016-08-15 MED ORDER — PALONOSETRON HCL INJECTION 0.25 MG/5ML
0.2500 mg | Freq: Once | INTRAVENOUS | Status: AC
  Administered 2016-08-15: 0.25 mg via INTRAVENOUS

## 2016-08-15 MED ORDER — FAMOTIDINE IN NACL 20-0.9 MG/50ML-% IV SOLN
20.0000 mg | Freq: Once | INTRAVENOUS | Status: AC
  Administered 2016-08-15: 20 mg via INTRAVENOUS

## 2016-08-15 NOTE — Patient Instructions (Signed)
[facility] Discharge Instructions for Patients Receiving Chemotherapy  Today you received the following chemotherapy agents taxol/carboplatin  To help prevent nausea and vomiting after your treatment, we encourage you to take your nausea medication as directed   If you develop nausea and vomiting that is not controlled by your nausea medication, call the clinic.   BELOW ARE SYMPTOMS THAT SHOULD BE REPORTED IMMEDIATELY:  *FEVER GREATER THAN 100.5 F  *CHILLS WITH OR WITHOUT FEVER  NAUSEA AND VOMITING THAT IS NOT CONTROLLED WITH YOUR NAUSEA MEDICATION  *UNUSUAL SHORTNESS OF BREATH  *UNUSUAL BRUISING OR BLEEDING  TENDERNESS IN MOUTH AND THROAT WITH OR WITHOUT PRESENCE OF ULCERS  *URINARY PROBLEMS  *BOWEL PROBLEMS  UNUSUAL RASH Items with * indicate a potential emergency and should be followed up as soon as possible.  Feel free to call the clinic you have any questions or concerns. The clinic phone number is [phone].  

## 2016-08-15 NOTE — Progress Notes (Signed)
[place] [phone]) [phone]   [phone]  OFFICE PROGRESS NOTE  [name], MD [address] [facility] [address] [place] [address]  DIAGNOSIS: Stage IIIa (T1b, N2, M0) non-small cell lung cancer, adenocarcinoma presented with right lower lobe lung mass in addition to right hilar and subcarinal lymphadenopathy diagnosed in June 2018.  PRIOR THERAPY:None.  CURRENT THERAPY: Concurrent chemoradiation with weekly carboplatin for AUC of 2 and paclitaxel 45 MG/M2. Status post one cycle.  INTERVAL HISTORY: Nicole [name] 76 y.o. female returns to the clinic today for follow-up visit accompanied by her husband. The patient is feeling fine today with no specific complaints. She tolerated the first week of her treatment with concurrent chemoradiation fairly well. She denied having any nausea or vomiting. She has no chest pain, shortness of breath, cough or hemoptysis. She has no fever or chills. She denied having any headache or visual changes. She had MRI of the brain that was unremarkable for metastatic disease to the brain. The patient is here today for evaluation before starting cycle #2.  MEDICAL HISTORY: Past Medical History:  Diagnosis Date  . Adenocarcinoma of right lung, stage 3 ([place]) 07/28/2016  . Bronchitis    hx of  . 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 Diabetes mellitus    type 2  . Gallstones   . Headache   . Hypertension   . Low back pain   . Lung mass    with lymphadenopathy  . Osteoarthritis     ALLERGIES:  is allergic to no known allergies.  MEDICATIONS:  Current Outpatient Prescriptions  Medication Sig Dispense Refill  . allopurinol (ZYLOPRIM) 100 MG tablet take 1 tablet by mouth once daily 90 tablet 1  . CALCIUM-VITAMIN D PO Take 1 tablet by mouth daily.     . Cholecalciferol (VITAMIN D3 PO) Take 1 tablet by mouth daily.    . Cyanocobalamin (VITAMIN B-12 CR PO) Take 250 mcg by mouth daily.     [name] FREESTYLE LITE test strip  TEST once daily as directed 100 each 11  . losartan (COZAAR) 100 MG tablet take 1 tablet by mouth once daily 90 tablet 3  . metFORMIN (GLUCOPHAGE-XR) 750 MG 24 hr tablet take 2 tablets by mouth once daily with BREAKFAST 180 tablet 1  . mometasone-formoterol (DULERA) 100-5 MCG/ACT AERO Inhale 2 puffs into the lungs 2 times daily at 12 noon and 4 pm. 1 Inhaler 1  . Multiple Vitamins-Minerals (MULTIVITAMIN WITH MINERALS) tablet Take 1 tablet by mouth daily.    . nebivolol (BYSTOLIC) 10 MG tablet Take 1 tablet (10 mg total) by mouth daily. 119 tablet 0  . Turmeric 450 MG CAPS Take 900 mg by mouth daily.     . Wound Dressings (SONAFINE EX) Apply topically.    . Misc Natural Products (OSTEO BI-FLEX TRIPLE STRENGTH) TABS Take 1 tablet by mouth daily. Triple Action Joint Health by Joline Salt    . omega-3 acid ethyl esters (LOVAZA) 1 g capsule Take 2 capsules (2 g total) by mouth 2 (two) times daily. (Patient not taking: Reported on 07/07/2016) 120 capsule 11  . prochlorperazine (COMPAZINE) 10 MG tablet Take 1 tablet (10 mg total) by mouth every 6 (six) hours as needed for nausea or vomiting. (Patient not taking: Reported on 08/15/2016) 30 tablet 0   No current facility-administered medications for this visit.     SURGICAL HISTORY:  Past Surgical History:  Procedure Laterality Date  . ABDOMINAL HYSTERECTOMY  2004  . APPENDECTOMY  when 76 years old  . COLONOSCOPY  08-05-09   [name]  . INCONTINENCE SURGERY    . KNEE ARTHROSCOPY Left   . LEFT HEART CATHETERIZATION WITH CORONARY ANGIOGRAM N/A 07/19/2013   Procedure: LEFT HEART CATHETERIZATION WITH CORONARY ANGIOGRAM;  Surgeon: Sinclair Grooms, MD;  Location: [facility] CATH LAB;  Service: Cardiovascular;  Laterality: N/A;  . POLYPECTOMY  08-05-09   2 polyps  . TOTAL KNEE ARTHROPLASTY Left 03/28/2012   Procedure: LEFT TOTAL KNEE ARTHROPLASTY;  Surgeon: Tobi Bastos, MD;  Location: [facility];  Service: Orthopedics;  Laterality: Left;  . TUBAL LIGATION    .  VIDEO BRONCHOSCOPY WITH ENDOBRONCHIAL ULTRASOUND N/A 07/11/2016   Procedure: VIDEO BRONCHOSCOPY WITH ENDOBRONCHIAL ULTRASOUND;  Surgeon: Marshell Garfinkel, MD;  Location: [place];  Service: Pulmonary;  Laterality: N/A;    REVIEW OF SYSTEMS:  A comprehensive review of systems was negative except for: Constitutional: positive for fatigue   PHYSICAL EXAMINATION: General appearance: alert, cooperative, fatigued and no distress Head: Normocephalic, without obvious abnormality, atraumatic Neck: no adenopathy, no JVD, supple, symmetrical, trachea midline and thyroid not enlarged, symmetric, no tenderness/mass/nodules Lymph nodes: Cervical, supraclavicular, and axillary nodes normal. Resp: clear to auscultation bilaterally Back: symmetric, no curvature. ROM normal. No CVA tenderness. Cardio: regular rate and rhythm, S1, S2 normal, no murmur, click, rub or gallop GI: soft, non-tender; bowel sounds normal; no masses,  no organomegaly Extremities: extremities normal, atraumatic, no cyanosis or edema  ECOG PERFORMANCE STATUS: 1 - Symptomatic but completely ambulatory  Blood pressure 121/79, pulse 78, temperature 97.7 F (36.5 C), temperature source Oral, resp. rate 18, height 5\' 8"  (1.727 m), weight 182 lb 6.4 oz (82.7 kg), SpO2 100 %.  LABORATORY DATA: Lab Results  Component Value Date   WBC 4.1 08/15/2016   HGB 13.7 08/15/2016   HCT 40.9 08/15/2016   MCV 90.0 08/15/2016   PLT 227 08/15/2016      Chemistry      Component Value Date/Time   NA 137 08/15/2016 0939   K 4.2 08/15/2016 0939   CL 99 07/06/2016 1535   CO2 22 08/15/2016 0939   BUN 24.2 08/15/2016 0939   CREATININE 1.0 08/15/2016 0939      Component Value Date/Time   CALCIUM 9.5 08/15/2016 0939   ALKPHOS 61 08/15/2016 0939   AST 17 08/15/2016 0939   ALT 24 08/15/2016 0939   BILITOT 0.84 08/15/2016 0939       RADIOGRAPHIC STUDIES: Mr Nicole [name] IR Contrast  Result Date: 08/06/2016 CLINICAL DATA:  Adenocarcinoma of the lung,  staging. EXAM: MRI HEAD WITHOUT AND WITH CONTRAST TECHNIQUE: Multiplanar, multiecho pulse sequences of the brain and surrounding structures were obtained without and with intravenous contrast. CONTRAST:  46mL MULTIHANCE GADOBENATE DIMEGLUMINE 529 MG/ML IV SOLN COMPARISON:  PET scan 07/05/2016. FINDINGS: Brain: No evidence for acute infarction, hemorrhage, mass lesion, hydrocephalus, or extra-axial fluid. Normal cerebral volume. T2 and FLAIR hyperintensities throughout the periventricular and subcortical white matter, nonspecific, but most likely chronic microvascular ischemic change in this patient with hypertension and diabetes. Post infusion, no abnormal enhancement of the brain or meninges. Vascular: Flow voids are maintained throughout the carotid, basilar, and vertebral arteries. There are no areas of chronic hemorrhage. Skull and upper cervical spine: Unremarkable visualized calvarium, skullbase, and cervical vertebrae. Pituitary, pineal, cerebellar tonsils unremarkable. No upper cervical cord lesions. Sinuses/Orbits: No orbital masses or proptosis. Globes appear symmetric. Sinuses appear well aerated, without evidence for air-fluid level. Other: No nasopharyngeal pathology or  mastoid fluid. Scalp and other visualized extracranial soft tissues grossly unremarkable. IMPRESSION: Negative for intracranial metastatic disease. No osseous findings are observed. White matter signal abnormality most consistent with chronic microvascular ischemic change. No areas of vasogenic edema. Electronically Signed   By: Staci Righter M.D.   On: 08/06/2016 09:57    ASSESSMENT AND PLAN: This is a very pleasant 76 years old Asian female recently diagnosed with a stage IIIa non-small cell lung cancer, adenocarcinoma. She is currently undergoing a course of concurrent chemoradiation with weekly carboplatin and paclitaxel status post 1 cycle. She tolerated the first cycle of her treatment well with no significant adverse  effects. I recommended for the patient to proceed with cycle #2 today as a scheduled. She will come back for follow-up visit in 2 weeks for evaluation before starting cycle #4. The patient was advised to call immediately if she has any concerning symptoms in the interval. The patient voices understanding of current disease status and treatment options and is in agreement with the current care plan. All questions were answered. The patient knows to call the clinic with any problems, questions or concerns. We can certainly see the patient much sooner if necessary.  I spent 10 minutes counseling the patient face to face. The total time spent in the appointment was 15 minutes.  Disclaimer: This note was dictated with voice recognition software. Similar sounding words can inadvertently be transcribed and may not be corrected upon review.

## 2016-08-16 ENCOUNTER — Ambulatory Visit
Admission: RE | Admit: 2016-08-16 | Discharge: 2016-08-16 | Disposition: A | Discharge status: Home / Self Care | Payer: Medicare Other | Source: Ambulatory Visit | Attending: Radiation Oncology | Admitting: Radiation Oncology

## 2016-08-16 DIAGNOSIS — Z510 Encounter for antineoplastic radiation therapy: Secondary | ICD-10-CM | POA: Diagnosis not present

## 2016-08-16 DIAGNOSIS — C3431 Malignant neoplasm of lower lobe, right bronchus or lung: Secondary | ICD-10-CM | POA: Diagnosis not present

## 2016-08-17 ENCOUNTER — Ambulatory Visit
Admission: RE | Admit: 2016-08-17 | Discharge: 2016-08-17 | Disposition: A | Discharge status: Home / Self Care | Payer: Medicare Other | Source: Ambulatory Visit | Attending: Radiation Oncology | Admitting: Radiation Oncology

## 2016-08-17 DIAGNOSIS — C3431 Malignant neoplasm of lower lobe, right bronchus or lung: Secondary | ICD-10-CM | POA: Diagnosis not present

## 2016-08-17 DIAGNOSIS — Z510 Encounter for antineoplastic radiation therapy: Secondary | ICD-10-CM | POA: Diagnosis not present

## 2016-08-18 ENCOUNTER — Ambulatory Visit
Admission: RE | Admit: 2016-08-18 | Discharge: 2016-08-18 | Disposition: A | Discharge status: Home / Self Care | Payer: Medicare Other | Source: Ambulatory Visit | Attending: Radiation Oncology | Admitting: Radiation Oncology

## 2016-08-18 DIAGNOSIS — Z510 Encounter for antineoplastic radiation therapy: Secondary | ICD-10-CM | POA: Diagnosis not present

## 2016-08-18 DIAGNOSIS — C3431 Malignant neoplasm of lower lobe, right bronchus or lung: Secondary | ICD-10-CM | POA: Diagnosis not present

## 2016-08-19 ENCOUNTER — Ambulatory Visit
Admission: RE | Admit: 2016-08-19 | Discharge: 2016-08-19 | Disposition: A | Discharge status: Home / Self Care | Payer: Medicare Other | Source: Ambulatory Visit | Attending: Radiation Oncology | Admitting: Radiation Oncology

## 2016-08-19 DIAGNOSIS — C3431 Malignant neoplasm of lower lobe, right bronchus or lung: Secondary | ICD-10-CM | POA: Diagnosis not present

## 2016-08-19 DIAGNOSIS — Z510 Encounter for antineoplastic radiation therapy: Secondary | ICD-10-CM | POA: Diagnosis not present

## 2016-08-22 ENCOUNTER — Other Ambulatory Visit (HOSPITAL_BASED_OUTPATIENT_CLINIC_OR_DEPARTMENT_OTHER): Payer: Medicare Other

## 2016-08-22 ENCOUNTER — Ambulatory Visit (HOSPITAL_BASED_OUTPATIENT_CLINIC_OR_DEPARTMENT_OTHER): Payer: Medicare Other

## 2016-08-22 ENCOUNTER — Ambulatory Visit
Admission: RE | Admit: 2016-08-22 | Discharge: 2016-08-22 | Disposition: A | Discharge status: Home / Self Care | Payer: Medicare Other | Source: Ambulatory Visit | Attending: Radiation Oncology | Admitting: Radiation Oncology

## 2016-08-22 VITALS — BP 152/84 | HR 76 | Temp 98.2°F | Resp 18

## 2016-08-22 DIAGNOSIS — C3491 Malignant neoplasm of unspecified part of right bronchus or lung: Secondary | ICD-10-CM

## 2016-08-22 DIAGNOSIS — C3431 Malignant neoplasm of lower lobe, right bronchus or lung: Secondary | ICD-10-CM | POA: Diagnosis not present

## 2016-08-22 DIAGNOSIS — Z510 Encounter for antineoplastic radiation therapy: Secondary | ICD-10-CM | POA: Diagnosis not present

## 2016-08-22 DIAGNOSIS — Z5111 Encounter for antineoplastic chemotherapy: Secondary | ICD-10-CM

## 2016-08-22 LAB — COMPREHENSIVE METABOLIC PANEL
ALT: 26 U/L (ref 0–55)
AST: 21 U/L (ref 5–34)
Albumin: 3.4 g/dL — ABNORMAL LOW (ref 3.5–5.0)
Alkaline Phosphatase: 62 U/L (ref 40–150)
Anion Gap: 8 mEq/L (ref 3–11)
BUN: 20.1 mg/dL (ref 7.0–26.0)
CO2: 23 mEq/L (ref 22–29)
Calcium: 9.6 mg/dL (ref 8.4–10.4)
Chloride: 106 mEq/L (ref 98–109)
Creatinine: 0.9 mg/dL (ref 0.6–1.1)
EGFR: 63 mL/min/{1.73_m2} — ABNORMAL LOW (ref >90)
Glucose: 145 mg/dl — ABNORMAL HIGH (ref 70–140)
Potassium: 4 mEq/L (ref 3.5–5.1)
Sodium: 137 mEq/L (ref 136–145)
Total Bilirubin: 1.01 mg/dL (ref 0.20–1.20)
Total Protein: 7.2 g/dL (ref 6.4–8.3)

## 2016-08-22 LAB — CBC WITH DIFFERENTIAL/PLATELET
BASO%: 0.2 % (ref 0.0–2.0)
Basophils Absolute: 0 10*3/uL (ref 0.0–0.1)
EOS%: 1.7 % (ref 0.0–7.0)
Eosinophils Absolute: 0.1 10*3/uL (ref 0.0–0.5)
HCT: 40.3 % (ref 34.8–46.6)
HGB: 13.4 g/dL (ref 11.6–15.9)
LYMPH%: 20.7 % (ref 14.0–49.7)
MCH: 30.2 pg (ref 25.1–34.0)
MCHC: 33.3 g/dL (ref 31.5–36.0)
MCV: 91 fL (ref 79.5–101.0)
MONO#: 0.3 10*3/uL (ref 0.1–0.9)
MONO%: 7.1 % (ref 0.0–14.0)
NEUT#: 2.9 10*3/uL (ref 1.5–6.5)
NEUT%: 70.3 % (ref 38.4–76.8)
Platelets: 204 10*3/uL (ref 145–400)
RBC: 4.43 10*6/uL (ref 3.70–5.45)
RDW: 13.1 % (ref 11.2–14.5)
WBC: 4.1 10*3/uL (ref 3.9–10.3)
lymph#: 0.8 10*3/uL — ABNORMAL LOW (ref 0.9–3.3)

## 2016-08-22 MED ORDER — PALONOSETRON HCL INJECTION 0.25 MG/5ML
0.2500 mg | Freq: Once | INTRAVENOUS | Status: AC
  Administered 2016-08-22: 0.25 mg via INTRAVENOUS

## 2016-08-22 MED ORDER — DIPHENHYDRAMINE HCL 50 MG/ML IJ SOLN
50.0000 mg | Freq: Once | INTRAMUSCULAR | Status: AC
  Administered 2016-08-22: 50 mg via INTRAVENOUS

## 2016-08-22 MED ORDER — SODIUM CHLORIDE 0.9 % IV SOLN
Freq: Once | INTRAVENOUS | Status: AC
  Administered 2016-08-22: 12:00:00 via INTRAVENOUS

## 2016-08-22 MED ORDER — SODIUM CHLORIDE 0.9 % IV SOLN
20.0000 mg | Freq: Once | INTRAVENOUS | Status: AC
  Administered 2016-08-22: 20 mg via INTRAVENOUS
  Filled 2016-08-22: qty 2

## 2016-08-22 MED ORDER — FAMOTIDINE IN NACL 20-0.9 MG/50ML-% IV SOLN
20.0000 mg | Freq: Once | INTRAVENOUS | Status: AC
Start: 2016-08-22 — End: 2016-08-22
  Administered 2016-08-22: 20 mg via INTRAVENOUS

## 2016-08-22 MED ORDER — SODIUM CHLORIDE 0.9 % IV SOLN
45.0000 mg/m2 | Freq: Once | INTRAVENOUS | Status: AC
  Administered 2016-08-22: 90 mg via INTRAVENOUS
  Filled 2016-08-22: qty 15

## 2016-08-22 MED ORDER — FAMOTIDINE IN NACL 20-0.9 MG/50ML-% IV SOLN
INTRAVENOUS | Status: AC
  Filled 2016-08-22: qty 50

## 2016-08-22 MED ORDER — DIPHENHYDRAMINE HCL 50 MG/ML IJ SOLN
INTRAMUSCULAR | Status: AC
  Filled 2016-08-22: qty 1

## 2016-08-22 MED ORDER — SODIUM CHLORIDE 0.9 % IV SOLN
159.6000 mg | Freq: Once | INTRAVENOUS | Status: AC
  Administered 2016-08-22: 160 mg via INTRAVENOUS
  Filled 2016-08-22: qty 16

## 2016-08-22 MED ORDER — PALONOSETRON HCL INJECTION 0.25 MG/5ML
INTRAVENOUS | Status: AC
  Filled 2016-08-22: qty 5

## 2016-08-22 NOTE — Patient Instructions (Signed)
[facility] Discharge Instructions for Patients Receiving Chemotherapy  Today you received the following chemotherapy agents :  Taxol,  Carboplatin.  To help prevent nausea and vomiting after your treatment, we encourage you to take your nausea medication as prescribed.   If you develop nausea and vomiting that is not controlled by your nausea medication, call the clinic.   BELOW ARE SYMPTOMS THAT SHOULD BE REPORTED IMMEDIATELY:  *FEVER GREATER THAN 100.5 F  *CHILLS WITH OR WITHOUT FEVER  NAUSEA AND VOMITING THAT IS NOT CONTROLLED WITH YOUR NAUSEA MEDICATION  *UNUSUAL SHORTNESS OF BREATH  *UNUSUAL BRUISING OR BLEEDING  TENDERNESS IN MOUTH AND THROAT WITH OR WITHOUT PRESENCE OF ULCERS  *URINARY PROBLEMS  *BOWEL PROBLEMS  UNUSUAL RASH Items with * indicate a potential emergency and should be followed up as soon as possible.  Feel free to call the clinic you have any questions or concerns. The clinic phone number is [phone].  Please show the CHEMO ALERT CARD at check-in to the Emergency Department and triage nurse.   

## 2016-08-23 ENCOUNTER — Ambulatory Visit
Admission: RE | Admit: 2016-08-23 | Discharge: 2016-08-23 | Disposition: A | Discharge status: Home / Self Care | Payer: Medicare Other | Source: Ambulatory Visit | Attending: Radiation Oncology | Admitting: Radiation Oncology

## 2016-08-23 DIAGNOSIS — C3431 Malignant neoplasm of lower lobe, right bronchus or lung: Secondary | ICD-10-CM | POA: Diagnosis not present

## 2016-08-23 DIAGNOSIS — Z510 Encounter for antineoplastic radiation therapy: Secondary | ICD-10-CM | POA: Diagnosis not present

## 2016-08-24 ENCOUNTER — Ambulatory Visit
Admission: RE | Admit: 2016-08-24 | Discharge: 2016-08-24 | Disposition: A | Discharge status: Home / Self Care | Payer: Medicare Other | Source: Ambulatory Visit | Attending: Radiation Oncology | Admitting: Radiation Oncology

## 2016-08-24 DIAGNOSIS — Z510 Encounter for antineoplastic radiation therapy: Secondary | ICD-10-CM | POA: Diagnosis not present

## 2016-08-24 DIAGNOSIS — C3431 Malignant neoplasm of lower lobe, right bronchus or lung: Secondary | ICD-10-CM | POA: Diagnosis not present

## 2016-08-25 ENCOUNTER — Ambulatory Visit
Admission: RE | Admit: 2016-08-25 | Discharge: 2016-08-25 | Disposition: A | Discharge status: Home / Self Care | Payer: Medicare Other | Source: Ambulatory Visit | Attending: Radiation Oncology | Admitting: Radiation Oncology

## 2016-08-25 ENCOUNTER — Encounter (HOSPITAL_COMMUNITY): Payer: Self-pay

## 2016-08-25 ENCOUNTER — Telehealth: Payer: Self-pay | Admitting: Oncology

## 2016-08-25 DIAGNOSIS — C3431 Malignant neoplasm of lower lobe, right bronchus or lung: Secondary | ICD-10-CM | POA: Diagnosis not present

## 2016-08-25 DIAGNOSIS — Z510 Encounter for antineoplastic radiation therapy: Secondary | ICD-10-CM | POA: Diagnosis not present

## 2016-08-25 MED ORDER — SUCRALFATE 1 G PO TABS: 1.0000 g | ORAL_TABLET | Freq: Three times a day (TID) | ORAL | 0 refills | Status: DC

## 2016-08-25 NOTE — Telephone Encounter (Signed)
Called in prescription for Carafate tablets per Dr. Sondra Come to [facility].

## 2016-08-26 ENCOUNTER — Encounter: Payer: Self-pay | Admitting: *Deleted

## 2016-08-26 ENCOUNTER — Ambulatory Visit
Admission: RE | Admit: 2016-08-26 | Discharge: 2016-08-26 | Disposition: A | Discharge status: Home / Self Care | Payer: Medicare Other | Source: Ambulatory Visit | Attending: Radiation Oncology | Admitting: Radiation Oncology

## 2016-08-26 DIAGNOSIS — Z510 Encounter for antineoplastic radiation therapy: Secondary | ICD-10-CM | POA: Diagnosis not present

## 2016-08-26 DIAGNOSIS — C3431 Malignant neoplasm of lower lobe, right bronchus or lung: Secondary | ICD-10-CM | POA: Diagnosis not present

## 2016-08-26 NOTE — Progress Notes (Signed)
Oncology Nurse Navigator Documentation  Oncology Nurse Navigator Flowsheets 08/26/2016  Navigator Location [facility]  Navigator Encounter Type Other/I called [facility] to check on molecular test results.  I was told tissue was received and testing will be completed on 08/30/16.    Abnormal Finding Date 06/28/2016  Confirmed Diagnosis Date 07/11/2016  Multidisiplinary Clinic Date 07/28/2016  Treatment Initiated Date 08/04/2016  Treatment Phase Treatment  Barriers/Navigation Needs Coordination of Care  Interventions Coordination of Care  Coordination of Care Other  Acuity Level 1  Time Spent with Patient 30

## 2016-08-29 ENCOUNTER — Ambulatory Visit
Admission: RE | Admit: 2016-08-29 | Discharge: 2016-08-29 | Disposition: A | Discharge status: Home / Self Care | Payer: Medicare Other | Source: Ambulatory Visit | Attending: Radiation Oncology | Admitting: Radiation Oncology

## 2016-08-29 ENCOUNTER — Ambulatory Visit (HOSPITAL_BASED_OUTPATIENT_CLINIC_OR_DEPARTMENT_OTHER): Payer: Medicare Other | Admitting: Internal Medicine

## 2016-08-29 ENCOUNTER — Encounter: Payer: Self-pay | Admitting: Internal Medicine

## 2016-08-29 ENCOUNTER — Other Ambulatory Visit (HOSPITAL_BASED_OUTPATIENT_CLINIC_OR_DEPARTMENT_OTHER): Payer: Medicare Other

## 2016-08-29 ENCOUNTER — Ambulatory Visit (HOSPITAL_BASED_OUTPATIENT_CLINIC_OR_DEPARTMENT_OTHER): Payer: Medicare Other

## 2016-08-29 VITALS — BP 125/75 | HR 86 | Temp 98.0°F | Resp 20 | Ht 68.0 in | Wt 181.2 lb

## 2016-08-29 DIAGNOSIS — Z5111 Encounter for antineoplastic chemotherapy: Secondary | ICD-10-CM

## 2016-08-29 DIAGNOSIS — C3431 Malignant neoplasm of lower lobe, right bronchus or lung: Secondary | ICD-10-CM

## 2016-08-29 DIAGNOSIS — C3491 Malignant neoplasm of unspecified part of right bronchus or lung: Secondary | ICD-10-CM

## 2016-08-29 DIAGNOSIS — Z510 Encounter for antineoplastic radiation therapy: Secondary | ICD-10-CM | POA: Diagnosis not present

## 2016-08-29 DIAGNOSIS — C3490 Malignant neoplasm of unspecified part of unspecified bronchus or lung: Secondary | ICD-10-CM | POA: Diagnosis not present

## 2016-08-29 LAB — COMPREHENSIVE METABOLIC PANEL
ALT: 24 U/L (ref 0–55)
AST: 18 U/L (ref 5–34)
Albumin: 3.3 g/dL — ABNORMAL LOW (ref 3.5–5.0)
Alkaline Phosphatase: 69 U/L (ref 40–150)
Anion Gap: 7 mEq/L (ref 3–11)
BUN: 20.2 mg/dL (ref 7.0–26.0)
CO2: 22 mEq/L (ref 22–29)
Calcium: 9.3 mg/dL (ref 8.4–10.4)
Chloride: 106 mEq/L (ref 98–109)
Creatinine: 1.1 mg/dL (ref 0.6–1.1)
EGFR: 49 mL/min/{1.73_m2} — ABNORMAL LOW (ref >90)
Glucose: 185 mg/dl — ABNORMAL HIGH (ref 70–140)
Potassium: 4.2 mEq/L (ref 3.5–5.1)
Sodium: 136 mEq/L (ref 136–145)
Total Bilirubin: 0.94 mg/dL (ref 0.20–1.20)
Total Protein: 6.9 g/dL (ref 6.4–8.3)

## 2016-08-29 LAB — CBC WITH DIFFERENTIAL/PLATELET
BASO%: 0.5 % (ref 0.0–2.0)
Basophils Absolute: 0 10*3/uL (ref 0.0–0.1)
EOS%: 0.7 % (ref 0.0–7.0)
Eosinophils Absolute: 0 10*3/uL (ref 0.0–0.5)
HCT: 39 % (ref 34.8–46.6)
HGB: 13.1 g/dL (ref 11.6–15.9)
LYMPH%: 10.3 % — ABNORMAL LOW (ref 14.0–49.7)
MCH: 30.5 pg (ref 25.1–34.0)
MCHC: 33.7 g/dL (ref 31.5–36.0)
MCV: 90.6 fL (ref 79.5–101.0)
MONO#: 0.4 10*3/uL (ref 0.1–0.9)
MONO%: 8.6 % (ref 0.0–14.0)
NEUT#: 3.4 10*3/uL (ref 1.5–6.5)
NEUT%: 79.9 % — ABNORMAL HIGH (ref 38.4–76.8)
Platelets: 204 10*3/uL (ref 145–400)
RBC: 4.3 10*6/uL (ref 3.70–5.45)
RDW: 13.2 % (ref 11.2–14.5)
WBC: 4.2 10*3/uL (ref 3.9–10.3)
lymph#: 0.4 10*3/uL — ABNORMAL LOW (ref 0.9–3.3)

## 2016-08-29 MED ORDER — SODIUM CHLORIDE 0.9 % IV SOLN
45.0000 mg/m2 | Freq: Once | INTRAVENOUS | Status: AC
  Administered 2016-08-29: 90 mg via INTRAVENOUS
  Filled 2016-08-29: qty 15

## 2016-08-29 MED ORDER — FAMOTIDINE IN NACL 20-0.9 MG/50ML-% IV SOLN
INTRAVENOUS | Status: AC
  Filled 2016-08-29: qty 50

## 2016-08-29 MED ORDER — PALONOSETRON HCL INJECTION 0.25 MG/5ML
0.2500 mg | Freq: Once | INTRAVENOUS | Status: AC
  Administered 2016-08-29: 0.25 mg via INTRAVENOUS

## 2016-08-29 MED ORDER — FAMOTIDINE IN NACL 20-0.9 MG/50ML-% IV SOLN
20.0000 mg | Freq: Once | INTRAVENOUS | Status: AC
  Administered 2016-08-29: 20 mg via INTRAVENOUS

## 2016-08-29 MED ORDER — SODIUM CHLORIDE 0.9 % IV SOLN
20.0000 mg | Freq: Once | INTRAVENOUS | Status: AC
  Administered 2016-08-29: 20 mg via INTRAVENOUS
  Filled 2016-08-29: qty 2

## 2016-08-29 MED ORDER — DIPHENHYDRAMINE HCL 50 MG/ML IJ SOLN
INTRAMUSCULAR | Status: AC
  Filled 2016-08-29: qty 1

## 2016-08-29 MED ORDER — SODIUM CHLORIDE 0.9 % IV SOLN
Freq: Once | INTRAVENOUS | Status: AC
  Administered 2016-08-29: 10:00:00 via INTRAVENOUS

## 2016-08-29 MED ORDER — PALONOSETRON HCL INJECTION 0.25 MG/5ML
INTRAVENOUS | Status: AC
  Filled 2016-08-29: qty 5

## 2016-08-29 MED ORDER — SODIUM CHLORIDE 0.9 % IV SOLN
159.6000 mg | Freq: Once | INTRAVENOUS | Status: AC
  Administered 2016-08-29: 160 mg via INTRAVENOUS
  Filled 2016-08-29: qty 16

## 2016-08-29 MED ORDER — DIPHENHYDRAMINE HCL 50 MG/ML IJ SOLN
50.0000 mg | Freq: Once | INTRAMUSCULAR | Status: AC
  Administered 2016-08-29: 50 mg via INTRAVENOUS

## 2016-08-29 NOTE — Patient Instructions (Signed)
[facility] Discharge Instructions for Patients Receiving Chemotherapy  Today you received the following chemotherapy agents Taxol/Carboplatin To help prevent nausea and vomiting after your treatment, we encourage you to take your nausea medication as prescribed.   If you develop nausea and vomiting that is not controlled by your nausea medication, call the clinic.   BELOW ARE SYMPTOMS THAT SHOULD BE REPORTED IMMEDIATELY:  *FEVER GREATER THAN 100.5 F  *CHILLS WITH OR WITHOUT FEVER  NAUSEA AND VOMITING THAT IS NOT CONTROLLED WITH YOUR NAUSEA MEDICATION  *UNUSUAL SHORTNESS OF BREATH  *UNUSUAL BRUISING OR BLEEDING  TENDERNESS IN MOUTH AND THROAT WITH OR WITHOUT PRESENCE OF ULCERS  *URINARY PROBLEMS  *BOWEL PROBLEMS  UNUSUAL RASH Items with * indicate a potential emergency and should be followed up as soon as possible.  Feel free to call the clinic you have any questions or concerns. The clinic phone number is [phone].  Please show the CHEMO ALERT CARD at check-in to the Emergency Department and triage nurse.   

## 2016-08-29 NOTE — Progress Notes (Signed)
[place] [phone]   [phone]  OFFICE PROGRESS NOTE  [name], MD [address] [facility] [address] [place] [address]  DIAGNOSIS: Stage IIIa (T1b, N2, M0) non-small cell lung cancer, adenocarcinoma presented with right lower lobe lung mass in addition to right hilar and subcarinal lymphadenopathy diagnosed in June 2018.  PRIOR THERAPY:None.  CURRENT THERAPY: Concurrent chemoradiation with weekly carboplatin for AUC of 2 and paclitaxel 45 MG/M2. Status post 3 cycles.  INTERVAL HISTORY: Nicole [name] 76 y.o. female returns to the clinic today for follow-up visit accompanied by a friend. The patient is tolerating her current treatment with concurrent chemoradiation fairly well status post 3 cycles. She denied having any significant nausea or vomiting. She has no fever or chills. She denied having any weight loss or night sweats. The patient denied having any chest pain, shortness of breath, cough or hemoptysis. She is here today for evaluation before starting cycle #4.   MEDICAL HISTORY: Past Medical History:  Diagnosis Date  . Adenocarcinoma of right lung, stage 3 ([place]) 07/28/2016  . Bronchitis    hx of  . Cancer [facility]) 2004   uterine/cervical  . Diabetes mellitus    type 2  . Gallstones   . Headache   . Hypertension   . Low back pain   . Lung mass    with lymphadenopathy  . Osteoarthritis     ALLERGIES:  is allergic to no known allergies.  MEDICATIONS:  Current Outpatient Prescriptions  Medication Sig Dispense Refill  . allopurinol (ZYLOPRIM) 100 MG tablet take 1 tablet by mouth once daily 90 tablet 1  . CALCIUM-VITAMIN D PO Take 1 tablet by mouth daily.     . Cholecalciferol (VITAMIN D3 PO) Take 1 tablet by mouth daily.    . Cyanocobalamin (VITAMIN B-12 CR PO) Take 250 mcg by mouth daily.     [name] FREESTYLE LITE test strip TEST once daily as directed 100 each 11  . losartan (COZAAR) 100 MG tablet take 1 tablet by mouth  once daily 90 tablet 3  . metFORMIN (GLUCOPHAGE-XR) 750 MG 24 hr tablet take 2 tablets by mouth once daily with BREAKFAST 180 tablet 1  . Misc Natural Products (OSTEO BI-FLEX TRIPLE STRENGTH) TABS Take 1 tablet by mouth daily. Triple Action Joint Health by Joline Salt    . mometasone-formoterol (DULERA) 100-5 MCG/ACT AERO Inhale 2 puffs into the lungs 2 times daily at 12 noon and 4 pm. 1 Inhaler 1  . Multiple Vitamins-Minerals (MULTIVITAMIN WITH MINERALS) tablet Take 1 tablet by mouth daily.    . nebivolol (BYSTOLIC) 10 MG tablet Take 1 tablet (10 mg total) by mouth daily. 119 tablet 0  . omega-3 acid ethyl esters (LOVAZA) 1 g capsule Take 2 capsules (2 g total) by mouth 2 (two) times daily. 120 capsule 11  . prochlorperazine (COMPAZINE) 10 MG tablet Take 1 tablet (10 mg total) by mouth every 6 (six) hours as needed for nausea or vomiting. 30 tablet 0  . sucralfate (CARAFATE) 1 g tablet Take 1 tablet (1 g total) by mouth 4 (four) times daily -  with meals and at bedtime. Dissolve tablet in 15 ml of water before taking. 120 tablet 0  . Turmeric 450 MG CAPS Take 900 mg by mouth daily.     . Wound Dressings (SONAFINE EX) Apply topically.     No current facility-administered medications for this visit.     SURGICAL HISTORY:  Past Surgical History:  Procedure Laterality Date  . ABDOMINAL HYSTERECTOMY  2004  . APPENDECTOMY  when 76 years old  . COLONOSCOPY  08-05-09   [name]  . INCONTINENCE SURGERY    . KNEE ARTHROSCOPY Left   . LEFT HEART CATHETERIZATION WITH CORONARY ANGIOGRAM N/A 07/19/2013   Procedure: LEFT HEART CATHETERIZATION WITH CORONARY ANGIOGRAM;  Surgeon: Sinclair Grooms, MD;  Location: [facility] CATH LAB;  Service: Cardiovascular;  Laterality: N/A;  . POLYPECTOMY  08-05-09   2 polyps  . TOTAL KNEE ARTHROPLASTY Left 03/28/2012   Procedure: LEFT TOTAL KNEE ARTHROPLASTY;  Surgeon: Tobi Bastos, MD;  Location: [facility];  Service: Orthopedics;  Laterality: Left;  . TUBAL LIGATION    . VIDEO  BRONCHOSCOPY WITH ENDOBRONCHIAL ULTRASOUND N/A 07/11/2016   Procedure: VIDEO BRONCHOSCOPY WITH ENDOBRONCHIAL ULTRASOUND;  Surgeon: Marshell Garfinkel, MD;  Location: [place];  Service: Pulmonary;  Laterality: N/A;    REVIEW OF SYSTEMS:  A comprehensive review of systems was negative except for: Constitutional: positive for fatigue Gastrointestinal: positive for odynophagia   PHYSICAL EXAMINATION: General appearance: alert, cooperative, fatigued and no distress Head: Normocephalic, without obvious abnormality, atraumatic Neck: no adenopathy, no JVD, supple, symmetrical, trachea midline and thyroid not enlarged, symmetric, no tenderness/mass/nodules Lymph nodes: Cervical, supraclavicular, and axillary nodes normal. Resp: clear to auscultation bilaterally Back: symmetric, no curvature. ROM normal. No CVA tenderness. Cardio: regular rate and rhythm, S1, S2 normal, no murmur, click, rub or gallop GI: soft, non-tender; bowel sounds normal; no masses,  no organomegaly Extremities: extremities normal, atraumatic, no cyanosis or edema  ECOG PERFORMANCE STATUS: 1 - Symptomatic but completely ambulatory  Blood pressure 125/75, pulse 86, temperature 98 F (36.7 C), temperature source Oral, resp. rate 20, height 5\' 8"  (1.727 m), weight 181 lb 3.2 oz (82.2 kg).  LABORATORY DATA: Lab Results  Component Value Date   WBC 4.2 08/29/2016   HGB 13.1 08/29/2016   HCT 39.0 08/29/2016   MCV 90.6 08/29/2016   PLT 204 08/29/2016      Chemistry      Component Value Date/Time   NA 137 08/22/2016 1047   K 4.0 08/22/2016 1047   CL 99 07/06/2016 1535   CO2 23 08/22/2016 1047   BUN 20.1 08/22/2016 1047   CREATININE 0.9 08/22/2016 1047      Component Value Date/Time   CALCIUM 9.6 08/22/2016 1047   ALKPHOS 62 08/22/2016 1047   AST 21 08/22/2016 1047   ALT 26 08/22/2016 1047   BILITOT 1.01 08/22/2016 1047       RADIOGRAPHIC STUDIES: Mr Jeri Cos PY Contrast  Result Date: 08/06/2016 CLINICAL DATA:   Adenocarcinoma of the lung, staging. EXAM: MRI HEAD WITHOUT AND WITH CONTRAST TECHNIQUE: Multiplanar, multiecho pulse sequences of the brain and surrounding structures were obtained without and with intravenous contrast. CONTRAST:  8mL MULTIHANCE GADOBENATE DIMEGLUMINE 529 MG/ML IV SOLN COMPARISON:  PET scan 07/05/2016. FINDINGS: Brain: No evidence for acute infarction, hemorrhage, mass lesion, hydrocephalus, or extra-axial fluid. Normal cerebral volume. T2 and FLAIR hyperintensities throughout the periventricular and subcortical white matter, nonspecific, but most likely chronic microvascular ischemic change in this patient with hypertension and diabetes. Post infusion, no abnormal enhancement of the brain or meninges. Vascular: Flow voids are maintained throughout the carotid, basilar, and vertebral arteries. There are no areas of chronic hemorrhage. Skull and upper cervical spine: Unremarkable visualized calvarium, skullbase, and cervical vertebrae. Pituitary, pineal, cerebellar tonsils unremarkable. No upper cervical cord lesions. Sinuses/Orbits: No orbital masses or proptosis. Globes appear symmetric. Sinuses appear well aerated, without  evidence for air-fluid level. Other: No nasopharyngeal pathology or mastoid fluid. Scalp and other visualized extracranial soft tissues grossly unremarkable. IMPRESSION: Negative for intracranial metastatic disease. No osseous findings are observed. White matter signal abnormality most consistent with chronic microvascular ischemic change. No areas of vasogenic edema. Electronically Signed   By: Staci Righter M.D.   On: 08/06/2016 09:57    ASSESSMENT AND PLAN: This is a very pleasant 76 years old Asian female recently diagnosed with a stage IIIa non-small cell lung cancer, adenocarcinoma. She is currently undergoing a course of concurrent chemoradiation with weekly carboplatin and paclitaxel status post 3 cycles. The patient continues to tolerate her treatment fairly well  except for mild odynophagia and she is currently on Carafate. I recommended for her to proceed with cycle #4 today as scheduled. I will see her back for follow-up visit in 2 weeks for evaluation before starting cycle #6. She was advised to call immediately if she has any concerning symptoms in the interval. The patient voices understanding of current disease status and treatment options and is in agreement with the current care plan. All questions were answered. The patient knows to call the clinic with any problems, questions or concerns. We can certainly see the patient much sooner if necessary.  I spent 10 minutes counseling the patient face to face. The total time spent in the appointment was 15 minutes.  Disclaimer: This note was dictated with voice recognition software. Similar sounding words can inadvertently be transcribed and may not be corrected upon review.

## 2016-08-30 ENCOUNTER — Ambulatory Visit
Admission: RE | Admit: 2016-08-30 | Discharge: 2016-08-30 | Disposition: A | Discharge status: Home / Self Care | Payer: Medicare Other | Source: Ambulatory Visit | Attending: Radiation Oncology | Admitting: Radiation Oncology

## 2016-08-30 DIAGNOSIS — Z510 Encounter for antineoplastic radiation therapy: Secondary | ICD-10-CM | POA: Diagnosis not present

## 2016-08-30 DIAGNOSIS — C3431 Malignant neoplasm of lower lobe, right bronchus or lung: Secondary | ICD-10-CM | POA: Diagnosis not present

## 2016-08-31 ENCOUNTER — Ambulatory Visit
Admission: RE | Admit: 2016-08-31 | Discharge: 2016-08-31 | Disposition: A | Discharge status: Home / Self Care | Payer: Medicare Other | Source: Ambulatory Visit | Attending: Radiation Oncology | Admitting: Radiation Oncology

## 2016-08-31 DIAGNOSIS — C3431 Malignant neoplasm of lower lobe, right bronchus or lung: Secondary | ICD-10-CM | POA: Diagnosis not present

## 2016-08-31 DIAGNOSIS — Z510 Encounter for antineoplastic radiation therapy: Secondary | ICD-10-CM | POA: Diagnosis not present

## 2016-09-01 ENCOUNTER — Encounter: Payer: Self-pay | Admitting: *Deleted

## 2016-09-01 ENCOUNTER — Ambulatory Visit
Admission: RE | Admit: 2016-09-01 | Discharge: 2016-09-01 | Disposition: A | Discharge status: Home / Self Care | Payer: Medicare Other | Source: Ambulatory Visit | Attending: Radiation Oncology | Admitting: Radiation Oncology

## 2016-09-01 DIAGNOSIS — C3431 Malignant neoplasm of lower lobe, right bronchus or lung: Secondary | ICD-10-CM | POA: Diagnosis not present

## 2016-09-01 DIAGNOSIS — Z510 Encounter for antineoplastic radiation therapy: Secondary | ICD-10-CM | POA: Diagnosis not present

## 2016-09-01 NOTE — Progress Notes (Signed)
Oncology Nurse Navigator Documentation  Oncology Nurse Navigator Flowsheets 09/01/2016  Navigator Location [facility]  Navigator Encounter Type Other/I followed up on molecular test results.  Not in EMR.  I contacted [facility] for an update.   Treatment Phase Treatment  Barriers/Navigation Needs Coordination of Care  Interventions Coordination of Care  Coordination of Care Other  Acuity Level 1  Time Spent with Patient 15

## 2016-09-02 ENCOUNTER — Ambulatory Visit
Admission: RE | Admit: 2016-09-02 | Discharge: 2016-09-02 | Disposition: A | Discharge status: Home / Self Care | Payer: Medicare Other | Source: Ambulatory Visit | Attending: Radiation Oncology | Admitting: Radiation Oncology

## 2016-09-02 ENCOUNTER — Encounter (HOSPITAL_COMMUNITY): Payer: Self-pay

## 2016-09-02 DIAGNOSIS — C3431 Malignant neoplasm of lower lobe, right bronchus or lung: Secondary | ICD-10-CM | POA: Diagnosis not present

## 2016-09-02 DIAGNOSIS — Z510 Encounter for antineoplastic radiation therapy: Secondary | ICD-10-CM | POA: Diagnosis not present

## 2016-09-05 ENCOUNTER — Other Ambulatory Visit (HOSPITAL_BASED_OUTPATIENT_CLINIC_OR_DEPARTMENT_OTHER): Payer: Medicare Other

## 2016-09-05 ENCOUNTER — Ambulatory Visit (HOSPITAL_BASED_OUTPATIENT_CLINIC_OR_DEPARTMENT_OTHER): Payer: Medicare Other

## 2016-09-05 ENCOUNTER — Ambulatory Visit
Admission: RE | Admit: 2016-09-05 | Discharge: 2016-09-05 | Disposition: A | Discharge status: Home / Self Care | Payer: Medicare Other | Source: Ambulatory Visit | Attending: Radiation Oncology | Admitting: Radiation Oncology

## 2016-09-05 VITALS — BP 136/80 | HR 86 | Temp 98.3°F | Resp 18

## 2016-09-05 DIAGNOSIS — Z5111 Encounter for antineoplastic chemotherapy: Secondary | ICD-10-CM

## 2016-09-05 DIAGNOSIS — C3431 Malignant neoplasm of lower lobe, right bronchus or lung: Secondary | ICD-10-CM

## 2016-09-05 DIAGNOSIS — C3491 Malignant neoplasm of unspecified part of right bronchus or lung: Secondary | ICD-10-CM

## 2016-09-05 DIAGNOSIS — Z510 Encounter for antineoplastic radiation therapy: Secondary | ICD-10-CM | POA: Diagnosis not present

## 2016-09-05 LAB — COMPREHENSIVE METABOLIC PANEL
ALT: 21 U/L (ref 0–55)
AST: 19 U/L (ref 5–34)
Albumin: 3.2 g/dL — ABNORMAL LOW (ref 3.5–5.0)
Alkaline Phosphatase: 55 U/L (ref 40–150)
Anion Gap: 7 mEq/L (ref 3–11)
BUN: 14.8 mg/dL (ref 7.0–26.0)
CO2: 23 mEq/L (ref 22–29)
Calcium: 9.5 mg/dL (ref 8.4–10.4)
Chloride: 106 mEq/L (ref 98–109)
Creatinine: 0.9 mg/dL (ref 0.6–1.1)
EGFR: 61 mL/min/{1.73_m2} — ABNORMAL LOW (ref >90)
Glucose: 151 mg/dl — ABNORMAL HIGH (ref 70–140)
Potassium: 4 mEq/L (ref 3.5–5.1)
Sodium: 136 mEq/L (ref 136–145)
Total Bilirubin: 0.67 mg/dL (ref 0.20–1.20)
Total Protein: 7 g/dL (ref 6.4–8.3)

## 2016-09-05 LAB — CBC WITH DIFFERENTIAL/PLATELET
BASO%: 0.5 % (ref 0.0–2.0)
Basophils Absolute: 0 10*3/uL (ref 0.0–0.1)
EOS%: 1.3 % (ref 0.0–7.0)
Eosinophils Absolute: 0 10*3/uL (ref 0.0–0.5)
HCT: 37.6 % (ref 34.8–46.6)
HGB: 12.9 g/dL (ref 11.6–15.9)
LYMPH%: 10.6 % — ABNORMAL LOW (ref 14.0–49.7)
MCH: 31.2 pg (ref 25.1–34.0)
MCHC: 34.4 g/dL (ref 31.5–36.0)
MCV: 90.7 fL (ref 79.5–101.0)
MONO#: 0.3 10*3/uL (ref 0.1–0.9)
MONO%: 9.1 % (ref 0.0–14.0)
NEUT#: 2.9 10*3/uL (ref 1.5–6.5)
NEUT%: 78.5 % — ABNORMAL HIGH (ref 38.4–76.8)
Platelets: 158 10*3/uL (ref 145–400)
RBC: 4.14 10*6/uL (ref 3.70–5.45)
RDW: 13.4 % (ref 11.2–14.5)
WBC: 3.7 10*3/uL — ABNORMAL LOW (ref 3.9–10.3)
lymph#: 0.4 10*3/uL — ABNORMAL LOW (ref 0.9–3.3)

## 2016-09-05 MED ORDER — SODIUM CHLORIDE 0.9 % IV SOLN
160.0000 mg | Freq: Once | INTRAVENOUS | Status: AC
  Administered 2016-09-05: 160 mg via INTRAVENOUS
  Filled 2016-09-05: qty 16

## 2016-09-05 MED ORDER — FAMOTIDINE IN NACL 20-0.9 MG/50ML-% IV SOLN
20.0000 mg | Freq: Once | INTRAVENOUS | Status: AC
  Administered 2016-09-05: 20 mg via INTRAVENOUS

## 2016-09-05 MED ORDER — FAMOTIDINE IN NACL 20-0.9 MG/50ML-% IV SOLN
INTRAVENOUS | Status: AC
  Filled 2016-09-05: qty 50

## 2016-09-05 MED ORDER — DIPHENHYDRAMINE HCL 50 MG/ML IJ SOLN
INTRAMUSCULAR | Status: AC
  Filled 2016-09-05: qty 1

## 2016-09-05 MED ORDER — PALONOSETRON HCL INJECTION 0.25 MG/5ML
INTRAVENOUS | Status: AC
  Filled 2016-09-05: qty 5

## 2016-09-05 MED ORDER — SODIUM CHLORIDE 0.9 % IV SOLN
Freq: Once | INTRAVENOUS | Status: AC
  Administered 2016-09-05: 11:00:00 via INTRAVENOUS

## 2016-09-05 MED ORDER — DIPHENHYDRAMINE HCL 50 MG/ML IJ SOLN
50.0000 mg | Freq: Once | INTRAMUSCULAR | Status: AC
  Administered 2016-09-05: 50 mg via INTRAVENOUS

## 2016-09-05 MED ORDER — PALONOSETRON HCL INJECTION 0.25 MG/5ML
0.2500 mg | Freq: Once | INTRAVENOUS | Status: AC
  Administered 2016-09-05: 0.25 mg via INTRAVENOUS

## 2016-09-05 MED ORDER — SODIUM CHLORIDE 0.9 % IV SOLN
20.0000 mg | Freq: Once | INTRAVENOUS | Status: AC
  Administered 2016-09-05: 20 mg via INTRAVENOUS
  Filled 2016-09-05: qty 2

## 2016-09-05 MED ORDER — SODIUM CHLORIDE 0.9 % IV SOLN
45.0000 mg/m2 | Freq: Once | INTRAVENOUS | Status: AC
  Administered 2016-09-05: 90 mg via INTRAVENOUS
  Filled 2016-09-05: qty 15

## 2016-09-05 NOTE — Patient Instructions (Signed)
[facility] Discharge Instructions for Patients Receiving Chemotherapy  Today you received the following chemotherapy agents Taxol/Carboplatin To help prevent nausea and vomiting after your treatment, we encourage you to take your nausea medication as prescribed.   If you develop nausea and vomiting that is not controlled by your nausea medication, call the clinic.   BELOW ARE SYMPTOMS THAT SHOULD BE REPORTED IMMEDIATELY:  *FEVER GREATER THAN 100.5 F  *CHILLS WITH OR WITHOUT FEVER  NAUSEA AND VOMITING THAT IS NOT CONTROLLED WITH YOUR NAUSEA MEDICATION  *UNUSUAL SHORTNESS OF BREATH  *UNUSUAL BRUISING OR BLEEDING  TENDERNESS IN MOUTH AND THROAT WITH OR WITHOUT PRESENCE OF ULCERS  *URINARY PROBLEMS  *BOWEL PROBLEMS  UNUSUAL RASH Items with * indicate a potential emergency and should be followed up as soon as possible.  Feel free to call the clinic you have any questions or concerns. The clinic phone number is [phone].  Please show the CHEMO ALERT CARD at check-in to the Emergency Department and triage nurse.   

## 2016-09-06 ENCOUNTER — Ambulatory Visit
Admission: RE | Admit: 2016-09-06 | Discharge: 2016-09-06 | Disposition: A | Discharge status: Home / Self Care | Payer: Medicare Other | Source: Ambulatory Visit | Attending: Radiation Oncology | Admitting: Radiation Oncology

## 2016-09-06 ENCOUNTER — Other Ambulatory Visit: Payer: Self-pay | Admitting: Radiation Oncology

## 2016-09-06 DIAGNOSIS — C3431 Malignant neoplasm of lower lobe, right bronchus or lung: Secondary | ICD-10-CM | POA: Diagnosis not present

## 2016-09-06 DIAGNOSIS — C3491 Malignant neoplasm of unspecified part of right bronchus or lung: Secondary | ICD-10-CM

## 2016-09-06 DIAGNOSIS — Z510 Encounter for antineoplastic radiation therapy: Secondary | ICD-10-CM | POA: Diagnosis not present

## 2016-09-06 MED ORDER — SONAFINE EX EMUL
1.0000 "application " | Freq: Once | CUTANEOUS | Status: AC
  Administered 2016-09-06: 1 via TOPICAL

## 2016-09-06 MED ORDER — OXYCODONE-ACETAMINOPHEN 5-325 MG PO TABS: 1.0000 | ORAL_TABLET | ORAL | 0 refills | Status: DC | PRN

## 2016-09-07 ENCOUNTER — Ambulatory Visit
Admission: RE | Admit: 2016-09-07 | Discharge: 2016-09-07 | Disposition: A | Discharge status: Home / Self Care | Payer: Medicare Other | Source: Ambulatory Visit | Attending: Radiation Oncology | Admitting: Radiation Oncology

## 2016-09-07 DIAGNOSIS — Z510 Encounter for antineoplastic radiation therapy: Secondary | ICD-10-CM | POA: Diagnosis not present

## 2016-09-07 DIAGNOSIS — C3431 Malignant neoplasm of lower lobe, right bronchus or lung: Secondary | ICD-10-CM | POA: Diagnosis not present

## 2016-09-08 ENCOUNTER — Ambulatory Visit
Admission: RE | Admit: 2016-09-08 | Discharge: 2016-09-08 | Disposition: A | Discharge status: Home / Self Care | Payer: Medicare Other | Source: Ambulatory Visit | Attending: Radiation Oncology | Admitting: Radiation Oncology

## 2016-09-08 DIAGNOSIS — Z510 Encounter for antineoplastic radiation therapy: Secondary | ICD-10-CM | POA: Diagnosis not present

## 2016-09-08 DIAGNOSIS — C3431 Malignant neoplasm of lower lobe, right bronchus or lung: Secondary | ICD-10-CM | POA: Diagnosis not present

## 2016-09-09 ENCOUNTER — Ambulatory Visit
Admission: RE | Admit: 2016-09-09 | Discharge: 2016-09-09 | Disposition: A | Discharge status: Home / Self Care | Payer: Medicare Other | Source: Ambulatory Visit | Attending: Radiation Oncology | Admitting: Radiation Oncology

## 2016-09-09 ENCOUNTER — Other Ambulatory Visit: Payer: Self-pay | Admitting: Internal Medicine

## 2016-09-09 DIAGNOSIS — Z510 Encounter for antineoplastic radiation therapy: Secondary | ICD-10-CM | POA: Diagnosis not present

## 2016-09-09 DIAGNOSIS — C3431 Malignant neoplasm of lower lobe, right bronchus or lung: Secondary | ICD-10-CM | POA: Diagnosis not present

## 2016-09-12 ENCOUNTER — Encounter: Payer: Self-pay | Admitting: Internal Medicine

## 2016-09-12 ENCOUNTER — Ambulatory Visit (HOSPITAL_BASED_OUTPATIENT_CLINIC_OR_DEPARTMENT_OTHER): Payer: Medicare Other

## 2016-09-12 ENCOUNTER — Ambulatory Visit
Admission: RE | Admit: 2016-09-12 | Discharge: 2016-09-12 | Disposition: A | Discharge status: Home / Self Care | Payer: Medicare Other | Source: Ambulatory Visit | Attending: Radiation Oncology | Admitting: Radiation Oncology

## 2016-09-12 ENCOUNTER — Telehealth: Payer: Self-pay | Admitting: Internal Medicine

## 2016-09-12 ENCOUNTER — Ambulatory Visit (HOSPITAL_BASED_OUTPATIENT_CLINIC_OR_DEPARTMENT_OTHER): Payer: Medicare Other | Admitting: Internal Medicine

## 2016-09-12 ENCOUNTER — Other Ambulatory Visit (HOSPITAL_BASED_OUTPATIENT_CLINIC_OR_DEPARTMENT_OTHER): Payer: Medicare Other

## 2016-09-12 VITALS — BP 118/78 | HR 90 | Temp 98.6°F | Resp 19 | Ht 68.0 in | Wt 175.9 lb

## 2016-09-12 DIAGNOSIS — Z5111 Encounter for antineoplastic chemotherapy: Secondary | ICD-10-CM

## 2016-09-12 DIAGNOSIS — C3431 Malignant neoplasm of lower lobe, right bronchus or lung: Secondary | ICD-10-CM | POA: Diagnosis not present

## 2016-09-12 DIAGNOSIS — Z510 Encounter for antineoplastic radiation therapy: Secondary | ICD-10-CM | POA: Diagnosis not present

## 2016-09-12 DIAGNOSIS — R1319 Other dysphagia: Secondary | ICD-10-CM | POA: Diagnosis not present

## 2016-09-12 DIAGNOSIS — C3491 Malignant neoplasm of unspecified part of right bronchus or lung: Secondary | ICD-10-CM

## 2016-09-12 LAB — CBC WITH DIFFERENTIAL/PLATELET
BASO%: 0.3 % (ref 0.0–2.0)
Basophils Absolute: 0 10*3/uL (ref 0.0–0.1)
EOS%: 1.5 % (ref 0.0–7.0)
Eosinophils Absolute: 0.1 10*3/uL (ref 0.0–0.5)
HCT: 36.9 % (ref 34.8–46.6)
HGB: 12.5 g/dL (ref 11.6–15.9)
LYMPH%: 9.6 % — ABNORMAL LOW (ref 14.0–49.7)
MCH: 30.8 pg (ref 25.1–34.0)
MCHC: 33.9 g/dL (ref 31.5–36.0)
MCV: 90.9 fL (ref 79.5–101.0)
MONO#: 0.3 10*3/uL (ref 0.1–0.9)
MONO%: 9 % (ref 0.0–14.0)
NEUT#: 2.7 10*3/uL (ref 1.5–6.5)
NEUT%: 79.6 % — ABNORMAL HIGH (ref 38.4–76.8)
Platelets: 124 10*3/uL — ABNORMAL LOW (ref 145–400)
RBC: 4.06 10*6/uL (ref 3.70–5.45)
RDW: 14 % (ref 11.2–14.5)
WBC: 3.4 10*3/uL — ABNORMAL LOW (ref 3.9–10.3)
lymph#: 0.3 10*3/uL — ABNORMAL LOW (ref 0.9–3.3)

## 2016-09-12 LAB — COMPREHENSIVE METABOLIC PANEL
ALT: 20 U/L (ref 0–55)
AST: 20 U/L (ref 5–34)
Albumin: 3.1 g/dL — ABNORMAL LOW (ref 3.5–5.0)
Alkaline Phosphatase: 55 U/L (ref 40–150)
Anion Gap: 5 mEq/L (ref 3–11)
BUN: 13.2 mg/dL (ref 7.0–26.0)
CO2: 24 mEq/L (ref 22–29)
Calcium: 9.6 mg/dL (ref 8.4–10.4)
Chloride: 106 mEq/L (ref 98–109)
Creatinine: 1 mg/dL (ref 0.6–1.1)
EGFR: 59 mL/min/{1.73_m2} — ABNORMAL LOW (ref >90)
Glucose: 132 mg/dl (ref 70–140)
Potassium: 3.9 mEq/L (ref 3.5–5.1)
Sodium: 135 mEq/L — ABNORMAL LOW (ref 136–145)
Total Bilirubin: 0.79 mg/dL (ref 0.20–1.20)
Total Protein: 6.9 g/dL (ref 6.4–8.3)

## 2016-09-12 MED ORDER — SODIUM CHLORIDE 0.9 % IV SOLN
45.0000 mg/m2 | Freq: Once | INTRAVENOUS | Status: AC
  Administered 2016-09-12: 90 mg via INTRAVENOUS
  Filled 2016-09-12: qty 15

## 2016-09-12 MED ORDER — FAMOTIDINE IN NACL 20-0.9 MG/50ML-% IV SOLN
20.0000 mg | Freq: Once | INTRAVENOUS | Status: AC
  Administered 2016-09-12: 20 mg via INTRAVENOUS

## 2016-09-12 MED ORDER — PALONOSETRON HCL INJECTION 0.25 MG/5ML
INTRAVENOUS | Status: AC
  Filled 2016-09-12: qty 5

## 2016-09-12 MED ORDER — SODIUM CHLORIDE 0.9 % IV SOLN
20.0000 mg | Freq: Once | INTRAVENOUS | Status: AC
  Administered 2016-09-12: 20 mg via INTRAVENOUS
  Filled 2016-09-12: qty 2

## 2016-09-12 MED ORDER — DIPHENHYDRAMINE HCL 50 MG/ML IJ SOLN
50.0000 mg | Freq: Once | INTRAMUSCULAR | Status: AC
  Administered 2016-09-12: 50 mg via INTRAVENOUS

## 2016-09-12 MED ORDER — FAMOTIDINE IN NACL 20-0.9 MG/50ML-% IV SOLN
INTRAVENOUS | Status: AC
  Filled 2016-09-12: qty 50

## 2016-09-12 MED ORDER — SODIUM CHLORIDE 0.9 % IV SOLN
Freq: Once | INTRAVENOUS | Status: AC
  Administered 2016-09-12: 12:00:00 via INTRAVENOUS

## 2016-09-12 MED ORDER — PALONOSETRON HCL INJECTION 0.25 MG/5ML
0.2500 mg | Freq: Once | INTRAVENOUS | Status: AC
  Administered 2016-09-12: 0.25 mg via INTRAVENOUS

## 2016-09-12 MED ORDER — CARBOPLATIN CHEMO INJECTION 450 MG/45ML
159.6000 mg | Freq: Once | INTRAVENOUS | Status: AC
  Administered 2016-09-12: 160 mg via INTRAVENOUS
  Filled 2016-09-12: qty 16

## 2016-09-12 MED ORDER — DIPHENHYDRAMINE HCL 50 MG/ML IJ SOLN
INTRAMUSCULAR | Status: AC
  Filled 2016-09-12: qty 1

## 2016-09-12 NOTE — Patient Instructions (Signed)
[facility] Discharge Instructions for Patients Receiving Chemotherapy  Today you received the following chemotherapy agents Taxol and Carboplatin. To help prevent nausea and vomiting after your treatment, we encourage you to take your nausea medication as directed.  If you develop nausea and vomiting that is not controlled by your nausea medication, call the clinic.   BELOW ARE SYMPTOMS THAT SHOULD BE REPORTED IMMEDIATELY:  *FEVER GREATER THAN 100.5 F  *CHILLS WITH OR WITHOUT FEVER  NAUSEA AND VOMITING THAT IS NOT CONTROLLED WITH YOUR NAUSEA MEDICATION  *UNUSUAL SHORTNESS OF BREATH  *UNUSUAL BRUISING OR BLEEDING  TENDERNESS IN MOUTH AND THROAT WITH OR WITHOUT PRESENCE OF ULCERS  *URINARY PROBLEMS  *BOWEL PROBLEMS  UNUSUAL RASH Items with * indicate a potential emergency and should be followed up as soon as possible.  Feel free to call the clinic you have any questions or concerns. The clinic phone number is [phone].  Please show the CHEMO ALERT CARD at check-in to the Emergency Department and triage nurse.    

## 2016-09-12 NOTE — Telephone Encounter (Signed)
Gave pt avs and calendar for upcoming appts as well.

## 2016-09-12 NOTE — Progress Notes (Signed)
[place] [phone]   [phone]  OFFICE PROGRESS NOTE  [name], MD [address] [facility] [address] [place] [address]  DIAGNOSIS: Stage IIIa (T1b, N2, M0) non-small cell lung cancer, adenocarcinoma presented with right lower lobe lung mass in addition to right hilar and subcarinal lymphadenopathy diagnosed in June 2018.  Biomarker Findings Tumor Mutational Burden - TMB-Intermediate (8 Muts/Mb) Microsatellite Status - Cannot Be Determined Genomic Findings For a complete list of the genes assayed, please refer to the Appendix. EGFR E709K, G719S JJO8CZ Y606T KZS0F0 splice site 932-3F>T 7 Disease relevant genes with no reportable alterations: KRAS, ALK, BRAF, MET, ERBB2, RET, ROS1  PDL1 Expression 70%.  PRIOR THERAPY:None.  CURRENT THERAPY: Concurrent chemoradiation with weekly carboplatin for AUC of 2 and paclitaxel 45 MG/M2. Status post 5 cycles.  INTERVAL HISTORY: Nicole [name] 76 y.o. female returns to the clinic today for follow-up visit accompanied by her caregiver. The patient is tolerating her treatment fairly well with no significant adverse effects except for mild fatigue and odynophagia. She denied having any nausea, vomiting, diarrhea or constipation. She denied having any fever or chills. She has no chest pain, shortness of breath, cough or hemoptysis. She has no significant weight loss or night sweats. She is here today for evaluation before starting cycle #6. Molecular studies by [employer] one showed positive of EGFR mutation.   MEDICAL HISTORY: Past Medical History:  Diagnosis Date  . Adenocarcinoma of right lung, stage 3 ([facility]) 07/28/2016  . Bronchitis    hx of  . 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 Diabetes mellitus    type 2  . Gallstones   . Headache   . Hypertension   . Low back pain   . Lung mass    with lymphadenopathy  . Osteoarthritis     ALLERGIES:  is allergic to no known  allergies.  MEDICATIONS:  Current Outpatient Prescriptions  Medication Sig Dispense Refill  . allopurinol (ZYLOPRIM) 100 MG tablet take 1 tablet by mouth once daily 90 tablet 1  . CALCIUM-VITAMIN D PO Take 1 tablet by mouth daily.     . Cholecalciferol (VITAMIN D3 PO) Take 1 tablet by mouth daily.    . Cyanocobalamin (VITAMIN B-12 CR PO) Take 250 mcg by mouth daily.     [name] FREESTYLE LITE test strip TEST once daily as directed 100 each 11  . losartan (COZAAR) 100 MG tablet take 1 tablet by mouth once daily 90 tablet 3  . metFORMIN (GLUCOPHAGE-XR) 750 MG 24 hr tablet take 2 tablets by mouth once daily with BREAKFAST 180 tablet 1  . Misc Natural Products (OSTEO BI-FLEX TRIPLE STRENGTH) TABS Take 1 tablet by mouth daily. Triple Action [employer] by Joline Salt    . mometasone-formoterol (DULERA) 100-5 MCG/ACT AERO Inhale 2 puffs into the lungs 2 times daily at 12 noon and 4 pm. 1 Inhaler 1  . Multiple Vitamins-Minerals (MULTIVITAMIN WITH MINERALS) tablet Take 1 tablet by mouth daily.    . nebivolol (BYSTOLIC) 10 MG tablet Take 1 tablet (10 mg total) by mouth daily. 119 tablet 0  . omega-3 acid ethyl esters (LOVAZA) 1 g capsule Take 2 capsules (2 g total) by mouth 2 (two) times daily. 120 capsule 11  . oxyCODONE-acetaminophen (PERCOCET/ROXICET) 5-325 MG tablet Take 1 tablet by mouth every 4 (four) hours as needed for severe pain. 30 tablet 0  . prochlorperazine (COMPAZINE) 10 MG tablet take 1 tablet by mouth every 6  hours if needed for nausea and vomiting 30 tablet 0  . sucralfate (CARAFATE) 1 g tablet Take 1 tablet (1 g total) by mouth 4 (four) times daily -  with meals and at bedtime. Dissolve tablet in 15 ml of water before taking. 120 tablet 0  . Turmeric 450 MG CAPS Take 900 mg by mouth daily.     . Wound Dressings (SONAFINE EX) Apply topically.     No current facility-administered medications for this visit.     SURGICAL HISTORY:  Past Surgical History:  Procedure Laterality Date  .  ABDOMINAL HYSTERECTOMY  2004  . APPENDECTOMY  when 76 years old  . COLONOSCOPY  08-05-09   Nicole [name]  . INCONTINENCE SURGERY    . KNEE ARTHROSCOPY Left   . LEFT HEART CATHETERIZATION WITH CORONARY ANGIOGRAM N/A 07/19/2013   Procedure: LEFT HEART CATHETERIZATION WITH CORONARY ANGIOGRAM;  Surgeon: Sinclair Grooms, MD;  Location: [facility] CATH LAB;  Service: Cardiovascular;  Laterality: N/A;  . POLYPECTOMY  08-05-09   2 polyps  . TOTAL KNEE ARTHROPLASTY Left 03/28/2012   Procedure: LEFT TOTAL KNEE ARTHROPLASTY;  Surgeon: Tobi Bastos, MD;  Location: [facility] ORS;  Service: Orthopedics;  Laterality: Left;  . TUBAL LIGATION    . VIDEO BRONCHOSCOPY WITH ENDOBRONCHIAL ULTRASOUND N/A 07/11/2016   Procedure: VIDEO BRONCHOSCOPY WITH ENDOBRONCHIAL ULTRASOUND;  Surgeon: Marshell Garfinkel, MD;  Location: [facility];  Service: Pulmonary;  Laterality: N/A;    REVIEW OF SYSTEMS:  A comprehensive review of systems was negative except for: Gastrointestinal: positive for odynophagia   PHYSICAL EXAMINATION: General appearance: alert, cooperative and no distress Head: Normocephalic, without obvious abnormality, atraumatic Neck: no adenopathy, no JVD, supple, symmetrical, trachea midline and thyroid not enlarged, symmetric, no tenderness/mass/nodules Lymph nodes: Cervical, supraclavicular, and axillary nodes normal. Resp: clear to auscultation bilaterally Back: symmetric, no curvature. ROM normal. No CVA tenderness. Cardio: regular rate and rhythm, S1, S2 normal, no murmur, click, rub or gallop GI: soft, non-tender; bowel sounds normal; no masses,  no organomegaly Extremities: extremities normal, atraumatic, no cyanosis or edema  ECOG PERFORMANCE STATUS: 1 - Symptomatic but completely ambulatory  Blood pressure 118/78, pulse 90, temperature 98.6 F (37 C), temperature source Oral, resp. rate 19, height _0  (1.727 m), weight 175 lb 14.4 oz (79.8 kg), SpO2 99 %.  LABORATORY DATA: Lab Results  Component Value Date   WBC  3.4 (L) 09/12/2016   HGB 12.5 09/12/2016   HCT 36.9 09/12/2016   MCV 90.9 09/12/2016   PLT 124 (L) 09/12/2016      Chemistry      Component Value Date/Time   NA 136 09/05/2016 0947   K 4.0 09/05/2016 0947   CL 99 07/06/2016 1535   CO2 23 09/05/2016 0947   BUN 14.8 09/05/2016 0947   CREATININE 0.9 09/05/2016 0947      Component Value Date/Time   CALCIUM 9.5 09/05/2016 0947   ALKPHOS 55 09/05/2016 0947   AST 19 09/05/2016 0947   ALT 21 09/05/2016 0947   BILITOT 0.67 09/05/2016 0947       RADIOGRAPHIC STUDIES: No results found.  ASSESSMENT AND PLAN: This is a very pleasant 76 years old Asian female recently diagnosed with a stage IIIa non-small cell lung cancer, adenocarcinoma. She is currently undergoing a course of concurrent chemoradiation with weekly carboplatin and paclitaxel status post 5 cycles. The patient is tolerating her treatment well except for mild odynophagia. I recommended for her to proceed with cycle #6 today as a scheduled. I will  see her back for follow-up visit in one month for evaluation after repeating CT scan of the chest for restaging of her disease. She was advised to call immediately if she has any concerning symptoms in the interval. The patient voices understanding of current disease status and treatment options and is in agreement with the current care plan. All questions were answered. The patient knows to call the clinic with any problems, questions or concerns. We can certainly see the patient much sooner if necessary. I spent 10 minutes counseling the patient face to face. The total time spent in the appointment was 15 minutes.  Disclaimer: This note was dictated with voice recognition software. Similar sounding words can inadvertently be transcribed and may not be corrected upon review.

## 2016-09-13 ENCOUNTER — Ambulatory Visit
Admission: RE | Admit: 2016-09-13 | Discharge: 2016-09-13 | Disposition: A | Discharge status: Home / Self Care | Payer: Medicare Other | Source: Ambulatory Visit | Attending: Radiation Oncology | Admitting: Radiation Oncology

## 2016-09-13 DIAGNOSIS — Z510 Encounter for antineoplastic radiation therapy: Secondary | ICD-10-CM | POA: Diagnosis not present

## 2016-09-13 DIAGNOSIS — C3431 Malignant neoplasm of lower lobe, right bronchus or lung: Secondary | ICD-10-CM | POA: Diagnosis not present

## 2016-09-14 ENCOUNTER — Ambulatory Visit
Admission: RE | Admit: 2016-09-14 | Discharge: 2016-09-14 | Disposition: A | Discharge status: Home / Self Care | Payer: Medicare Other | Source: Ambulatory Visit | Attending: Radiation Oncology | Admitting: Radiation Oncology

## 2016-09-14 DIAGNOSIS — Z510 Encounter for antineoplastic radiation therapy: Secondary | ICD-10-CM | POA: Diagnosis not present

## 2016-09-14 DIAGNOSIS — C3431 Malignant neoplasm of lower lobe, right bronchus or lung: Secondary | ICD-10-CM | POA: Diagnosis not present

## 2016-09-15 ENCOUNTER — Ambulatory Visit
Admission: RE | Admit: 2016-09-15 | Discharge: 2016-09-15 | Disposition: A | Discharge status: Home / Self Care | Payer: Medicare Other | Source: Ambulatory Visit | Attending: Radiation Oncology | Admitting: Radiation Oncology

## 2016-09-15 DIAGNOSIS — Z510 Encounter for antineoplastic radiation therapy: Secondary | ICD-10-CM | POA: Diagnosis not present

## 2016-09-15 DIAGNOSIS — C3431 Malignant neoplasm of lower lobe, right bronchus or lung: Secondary | ICD-10-CM | POA: Diagnosis not present

## 2016-09-16 ENCOUNTER — Ambulatory Visit: Payer: Medicare Other

## 2016-09-16 ENCOUNTER — Ambulatory Visit
Admission: RE | Admit: 2016-09-16 | Discharge: 2016-09-16 | Disposition: A | Discharge status: Home / Self Care | Payer: Medicare Other | Source: Ambulatory Visit | Attending: Radiation Oncology | Admitting: Radiation Oncology

## 2016-09-16 DIAGNOSIS — Z510 Encounter for antineoplastic radiation therapy: Secondary | ICD-10-CM | POA: Diagnosis not present

## 2016-09-16 DIAGNOSIS — C3431 Malignant neoplasm of lower lobe, right bronchus or lung: Secondary | ICD-10-CM | POA: Diagnosis not present

## 2016-09-19 ENCOUNTER — Other Ambulatory Visit (HOSPITAL_BASED_OUTPATIENT_CLINIC_OR_DEPARTMENT_OTHER): Payer: Medicare Other

## 2016-09-19 ENCOUNTER — Ambulatory Visit: Payer: Medicare Other

## 2016-09-19 ENCOUNTER — Encounter: Payer: Self-pay | Admitting: Radiation Oncology

## 2016-09-19 ENCOUNTER — Ambulatory Visit (HOSPITAL_BASED_OUTPATIENT_CLINIC_OR_DEPARTMENT_OTHER): Payer: Medicare Other | Admitting: Internal Medicine

## 2016-09-19 ENCOUNTER — Ambulatory Visit
Admission: RE | Admit: 2016-09-19 | Discharge: 2016-09-19 | Disposition: A | Discharge status: Home / Self Care | Payer: Medicare Other | Source: Ambulatory Visit | Attending: Radiation Oncology | Admitting: Radiation Oncology

## 2016-09-19 ENCOUNTER — Ambulatory Visit (HOSPITAL_BASED_OUTPATIENT_CLINIC_OR_DEPARTMENT_OTHER): Payer: Medicare Other

## 2016-09-19 ENCOUNTER — Other Ambulatory Visit: Payer: Self-pay | Admitting: Internal Medicine

## 2016-09-19 ENCOUNTER — Encounter: Payer: Self-pay | Admitting: Internal Medicine

## 2016-09-19 VITALS — BP 107/94 | HR 86 | Temp 98.7°F | Resp 18

## 2016-09-19 DIAGNOSIS — K208 Other esophagitis: Secondary | ICD-10-CM | POA: Diagnosis not present

## 2016-09-19 DIAGNOSIS — R1310 Dysphagia, unspecified: Secondary | ICD-10-CM | POA: Diagnosis not present

## 2016-09-19 DIAGNOSIS — C3431 Malignant neoplasm of lower lobe, right bronchus or lung: Secondary | ICD-10-CM

## 2016-09-19 DIAGNOSIS — Z5111 Encounter for antineoplastic chemotherapy: Secondary | ICD-10-CM

## 2016-09-19 DIAGNOSIS — C3491 Malignant neoplasm of unspecified part of right bronchus or lung: Secondary | ICD-10-CM | POA: Diagnosis not present

## 2016-09-19 DIAGNOSIS — R5383 Other fatigue: Secondary | ICD-10-CM | POA: Diagnosis not present

## 2016-09-19 DIAGNOSIS — Z510 Encounter for antineoplastic radiation therapy: Secondary | ICD-10-CM | POA: Diagnosis not present

## 2016-09-19 DIAGNOSIS — I959 Hypotension, unspecified: Secondary | ICD-10-CM

## 2016-09-19 LAB — CBC WITH DIFFERENTIAL/PLATELET
BASO%: 0.4 % (ref 0.0–2.0)
Basophils Absolute: 0 10*3/uL (ref 0.0–0.1)
EOS%: 1.3 % (ref 0.0–7.0)
Eosinophils Absolute: 0 10*3/uL (ref 0.0–0.5)
HCT: 38.3 % (ref 34.8–46.6)
HGB: 13.1 g/dL (ref 11.6–15.9)
LYMPH%: 8.6 % — ABNORMAL LOW (ref 14.0–49.7)
MCH: 31.2 pg (ref 25.1–34.0)
MCHC: 34.2 g/dL (ref 31.5–36.0)
MCV: 91.2 fL (ref 79.5–101.0)
MONO#: 0.3 10*3/uL (ref 0.1–0.9)
MONO%: 8.7 % (ref 0.0–14.0)
NEUT#: 2.4 10*3/uL (ref 1.5–6.5)
NEUT%: 81 % — ABNORMAL HIGH (ref 38.4–76.8)
Platelets: 176 10*3/uL (ref 145–400)
RBC: 4.2 10*6/uL (ref 3.70–5.45)
RDW: 14.8 % — ABNORMAL HIGH (ref 11.2–14.5)
WBC: 3 10*3/uL — ABNORMAL LOW (ref 3.9–10.3)
lymph#: 0.3 10*3/uL — ABNORMAL LOW (ref 0.9–3.3)

## 2016-09-19 LAB — COMPREHENSIVE METABOLIC PANEL
ALT: 16 U/L (ref 0–55)
AST: 16 U/L (ref 5–34)
Albumin: 3.4 g/dL — ABNORMAL LOW (ref 3.5–5.0)
Alkaline Phosphatase: 50 U/L (ref 40–150)
Anion Gap: 9 mEq/L (ref 3–11)
BUN: 16.7 mg/dL (ref 7.0–26.0)
CO2: 21 mEq/L — ABNORMAL LOW (ref 22–29)
Calcium: 9.6 mg/dL (ref 8.4–10.4)
Chloride: 106 mEq/L (ref 98–109)
Creatinine: 1.3 mg/dL — ABNORMAL HIGH (ref 0.6–1.1)
EGFR: 40 mL/min/{1.73_m2} — ABNORMAL LOW (ref >90)
Glucose: 176 mg/dl — ABNORMAL HIGH (ref 70–140)
Potassium: 4.3 mEq/L (ref 3.5–5.1)
Sodium: 137 mEq/L (ref 136–145)
Total Bilirubin: 0.83 mg/dL (ref 0.20–1.20)
Total Protein: 7.4 g/dL (ref 6.4–8.3)

## 2016-09-19 MED ORDER — DIPHENHYDRAMINE HCL 50 MG/ML IJ SOLN
50.0000 mg | Freq: Once | INTRAMUSCULAR | Status: AC
  Administered 2016-09-19: 50 mg via INTRAVENOUS

## 2016-09-19 MED ORDER — SODIUM CHLORIDE 0.9 % IV SOLN
159.6000 mg | Freq: Once | INTRAVENOUS | Status: AC
  Administered 2016-09-19: 160 mg via INTRAVENOUS
  Filled 2016-09-19: qty 16

## 2016-09-19 MED ORDER — FAMOTIDINE IN NACL 20-0.9 MG/50ML-% IV SOLN
INTRAVENOUS | Status: AC
  Filled 2016-09-19: qty 50

## 2016-09-19 MED ORDER — SODIUM CHLORIDE 0.9 % IV SOLN
20.0000 mg | Freq: Once | INTRAVENOUS | Status: AC
  Administered 2016-09-19: 20 mg via INTRAVENOUS
  Filled 2016-09-19: qty 2

## 2016-09-19 MED ORDER — FAMOTIDINE IN NACL 20-0.9 MG/50ML-% IV SOLN
20.0000 mg | Freq: Once | INTRAVENOUS | Status: AC
  Administered 2016-09-19: 20 mg via INTRAVENOUS

## 2016-09-19 MED ORDER — MAGIC MOUTHWASH: 5.0000 mL | Freq: Four times a day (QID) | ORAL | 1 refills | Status: DC | PRN

## 2016-09-19 MED ORDER — LIDOCAINE VISCOUS 2 % MT SOLN: 10.0000 mL | OROMUCOSAL | 1 refills | Status: DC | PRN

## 2016-09-19 MED ORDER — HEPARIN SOD (PORK) LOCK FLUSH 100 UNIT/ML IV SOLN
500.0000 [IU] | Freq: Once | INTRAVENOUS | Status: DC | PRN
  Filled 2016-09-19: qty 5

## 2016-09-19 MED ORDER — SODIUM CHLORIDE 0.9 % IV SOLN
45.0000 mg/m2 | Freq: Once | INTRAVENOUS | Status: AC
  Administered 2016-09-19: 90 mg via INTRAVENOUS
  Filled 2016-09-19: qty 15

## 2016-09-19 MED ORDER — SODIUM CHLORIDE 0.9 % IV SOLN
Freq: Once | INTRAVENOUS | Status: AC
  Administered 2016-09-19: 12:00:00 via INTRAVENOUS

## 2016-09-19 MED ORDER — PALONOSETRON HCL INJECTION 0.25 MG/5ML
INTRAVENOUS | Status: AC
  Filled 2016-09-19: qty 5

## 2016-09-19 MED ORDER — FLUCONAZOLE 100 MG PO TABS: 100.0000 mg | ORAL_TABLET | Freq: Every day | ORAL | 0 refills | Status: DC

## 2016-09-19 MED ORDER — SODIUM CHLORIDE 0.9% FLUSH
10.0000 mL | INTRAVENOUS | Status: DC | PRN
  Filled 2016-09-19: qty 10

## 2016-09-19 MED ORDER — PALONOSETRON HCL INJECTION 0.25 MG/5ML
0.2500 mg | Freq: Once | INTRAVENOUS | Status: AC
  Administered 2016-09-19: 0.25 mg via INTRAVENOUS

## 2016-09-19 MED ORDER — DIPHENHYDRAMINE HCL 50 MG/ML IJ SOLN
INTRAMUSCULAR | Status: AC
  Filled 2016-09-19: qty 1

## 2016-09-19 NOTE — Progress Notes (Signed)
Symptoms Management Clinic Progress Note   Nicole [name] is managed by Dr. Julien Nordmann   The patient is a follow-up appointment with Dr. Julien Nordmann on 10/12/2016.  Actively treated with chemotherapy: yes  Current Therapy: Carboplatinum and Taxol every 7 days with radiation therapy.  Last Treated: September 19, 2016 the patient completed fraction 30/30 of radiation therapy today.  Assessment/Plan:   Radiation-induced esophagitis - Plan: magic mouthwash SOLN, lidocaine (XYLOCAINE) 2 % solution  Oral tenderness - Plan: magic mouthwash SOLN, lidocaine (XYLOCAINE) 2 % solution  A prescription for Magic mouthwash and sent to the patient's pharmacy along with a prescription for viscous lidocaine. She should continue using her Percocet and Carafate as prescribed.  Please see After Visit Summary for patient specific instructions.  No orders of the defined types were placed in this encounter.   Subjective:   Patient ID:  Nicole [name] is a 76 y.o. (DOB Jan 11, 1941) female.   Chief Complaint:  Chief Complaint  Patient presents with  . Oral tenderness  . Dysphagia    HPI Nicole [name] presents to the office today with a compliant of Oral tenderness and difficult and painful swallowing. She has completed 30 fractions of radiation therapy to the chest for a lung cancer today. She is been treated concurrently with carboplatinum and Taxol. She has been using Carafate as prescribed with out substantial relief. She has had a 10 pound weight loss due to oral tenderness and pain with swallowing. She is even having pain with liquids. She has had chills but denies fevers or sweats.   Medications: I have reviewed the patient's current medications.  Allergies:  Allergies  Allergen Reactions  . No Known Allergies     Past Medical History, Surgical history, Social history, and Family history were reviewed and updated as appropriate.   Please see review of systems for further details on the  patient's review from today.   Review of Systems:  Review of Systems  Constitutional: Positive for appetite change and unexpected weight change. Negative for chills, diaphoresis and fever.  HENT: Positive for sore throat and trouble swallowing.        Oral tenderness    Objective:   Physical Exam:  There were no vitals taken for this visit. ECOG: 1  Physical Exam  Constitutional: No distress.  HENT:  Head: Normocephalic and atraumatic.  Mouth/Throat: Oropharynx is clear and moist. No oropharyngeal exudate.  Cardiovascular: Normal rate, regular rhythm and normal heart sounds.  Exam reveals no gallop and no friction rub.   No murmur heard. Pulmonary/Chest: Effort normal. No respiratory distress. She has no wheezes. She has no rales.      Neurological: She is alert.  Skin: Skin is warm and dry. She is not diaphoretic.  Psychiatric: She has a normal mood and affect. Judgment and thought content normal.   -------------------------------  Imaging from last 24 hours (if applicable):  Radiology interpretation: @IMAGING @     The patient was discussed with Dr. Julien Nordmann. A treatment plan was reviewed at length. He expressed agreement with the management of this patient.  ADDENDUM: Hematology/Oncology Attending: I had a face to face encounter with the patient today. I recommended her care plan. This is a very pleasant 76 years old Asian female with a stage IIIA non-small cell lung cancer currently undergoing a course of concurrent chemoradiation with weekly carboplatin and paclitaxel. She is here today for the last cycle of her treatment as well as the last fraction of radiotherapy. She has been  complaining of increasing fatigue as well as difficulty and painful swallowing recently secondary to radiation esophagitis. She was hypotensive in the clinic today. I will arrange for the patient to receive IV hydration with normal saline today. She also has oral thrush and will start her on  Diflucan and in addition to magic mouthwash. She will come back for follow-up visit in a few weeks for evaluation after repeating CT scan of the chest for restaging of her disease. The patient was advised to call immediately she has any concerning symptoms in the interval.  Disclaimer: This note was dictated with voice recognition software. Similar sounding words can inadvertently be transcribed and may be missed upon review. Eilleen Kempf, MD 09/19/16

## 2016-09-19 NOTE — Patient Instructions (Signed)
[facility] Discharge Instructions for Patients Receiving Chemotherapy  Today you received the following chemotherapy agents Taxol and Carboplatin  To help prevent nausea and vomiting after your treatment, we encourage you to take your nausea medication as directed   If you develop nausea and vomiting that is not controlled by your nausea medication, call the clinic.   BELOW ARE SYMPTOMS THAT SHOULD BE REPORTED IMMEDIATELY:  *FEVER GREATER THAN 100.5 F  *CHILLS WITH OR WITHOUT FEVER  NAUSEA AND VOMITING THAT IS NOT CONTROLLED WITH YOUR NAUSEA MEDICATION  *UNUSUAL SHORTNESS OF BREATH  *UNUSUAL BRUISING OR BLEEDING  TENDERNESS IN MOUTH AND THROAT WITH OR WITHOUT PRESENCE OF ULCERS  *URINARY PROBLEMS  *BOWEL PROBLEMS  UNUSUAL RASH Items with * indicate a potential emergency and should be followed up as soon as possible.  Feel free to call the clinic you have any questions or concerns. The clinic phone number is [phone].  Please show the [facility] at check-in to the Emergency Department and triage nurse.  Dehydration, Adult Dehydration is when there is not enough fluid or water in your body. This happens when you lose more fluids than you take in. Dehydration can range from mild to very bad. It should be treated right away to keep it from getting very bad. Symptoms of mild dehydration may include:  Thirst.  Dry lips.  Slightly dry mouth.  Dry, warm skin.  Dizziness. Symptoms of moderate dehydration may include:  Very dry mouth.  Muscle cramps.  Dark pee (urine). Pee may be the color of tea.  Your body making less pee.  Your eyes making fewer tears.  Heartbeat that is uneven or faster than normal (palpitations).  Headache.  Light-headedness, especially when you stand up from sitting.  Fainting (syncope). Symptoms of very bad dehydration may include:  Changes in skin, such as: ? Cold and clammy skin. ? Blotchy (mottled) or  pale skin. ? Skin that does not quickly return to normal after being lightly pinched and let go (poor skin turgor).  Changes in body fluids, such as: ? Feeling very thirsty. ? Your eyes making fewer tears. ? Not sweating when body temperature is high, such as in hot weather. ? Your body making very little pee.  Changes in vital signs, such as: ? Weak pulse. ? Pulse that is more than 100 beats a minute when you are sitting still. ? Fast breathing. ? Low blood pressure.  Other changes, such as: ? Sunken eyes. ? Cold hands and feet. ? Confusion. ? Lack of energy (lethargy). ? Trouble waking up from sleep. ? Short-term weight loss. ? Unconsciousness. Follow these instructions at home:  If told by your doctor, drink an ORS: ? Make an ORS by using instructions on the package. ? Start by drinking small amounts, about  cup (120 mL) every 5-10 minutes. ? Slowly drink more until you have had the amount that your doctor said to have.  Drink enough clear fluid to keep your pee clear or pale yellow. If you were told to drink an ORS, finish the ORS first, then start slowly drinking clear fluids. Drink fluids such as: ? Water. Do not drink only water by itself. Doing that can make the salt (sodium) level in your body get too low (hyponatremia). ? Ice chips. ? Fruit juice that you have added water to (diluted). ? Low-calorie sports drinks.  Avoid: ? Alcohol. ? Drinks that have a lot of sugar. These include high-calorie sports drinks, fruit juice  that does not have water added, and soda. ? Caffeine. ? Foods that are greasy or have a lot of fat or sugar.  Take over-the-counter and prescription medicines only as told by your doctor.  Do not take salt tablets. Doing that can make the salt level in your body get too high (hypernatremia).  Eat foods that have minerals (electrolytes). Examples include bananas, oranges, potatoes, tomatoes, and spinach.  Keep all follow-up visits as told by  your doctor. This is important. Contact a doctor if:  You have belly (abdominal) pain that: ? Gets worse. ? Stays in one area (localizes).  You have a rash.  You have a stiff neck.  You get angry or annoyed more easily than normal (irritability).  You are more sleepy than normal.  You have a harder time waking up than normal.  You feel: ? Weak. ? Dizzy. ? Very thirsty.  You have peed (urinated) only a small amount of very dark pee during 6-8 hours. Get help right away if:  You have symptoms of very bad dehydration.  You cannot drink fluids without throwing up (vomiting).  Your symptoms get worse with treatment.  You have a fever.  You have a very bad headache.  You are throwing up or having watery poop (diarrhea) and it: ? Gets worse. ? Does not go away.  You have blood or something green (bile) in your throw-up.  You have blood in your poop (stool). This may cause poop to look black and tarry.  You have not peed in 6-8 hours.  You pass out (faint).  Your heart rate when you are sitting still is more than 100 beats a minute.  You have trouble breathing. This information is not intended to replace advice given to you by your health care provider. Make sure you discuss any questions you have with your health care provider. Document Released: 11/06/2008 Document Revised: 07/31/2015 Document Reviewed: 03/06/2015 Elsevier Interactive Patient Education  2018 [employer].

## 2016-09-20 ENCOUNTER — Ambulatory Visit (INDEPENDENT_AMBULATORY_CARE_PROVIDER_SITE_OTHER): Payer: Medicare Other | Admitting: Internal Medicine

## 2016-09-20 ENCOUNTER — Ambulatory Visit: Payer: Medicare Other

## 2016-09-20 ENCOUNTER — Encounter: Payer: Self-pay | Admitting: Internal Medicine

## 2016-09-20 ENCOUNTER — Telehealth: Payer: Self-pay

## 2016-09-20 VITALS — BP 130/62 | HR 55 | Temp 98.3°F | Resp 16 | Ht 68.0 in | Wt 172.2 lb

## 2016-09-20 DIAGNOSIS — Z23 Encounter for immunization: Secondary | ICD-10-CM

## 2016-09-20 DIAGNOSIS — I10 Essential (primary) hypertension: Secondary | ICD-10-CM | POA: Diagnosis not present

## 2016-09-20 DIAGNOSIS — E118 Type 2 diabetes mellitus with unspecified complications: Secondary | ICD-10-CM | POA: Diagnosis not present

## 2016-09-20 LAB — POCT GLYCOSYLATED HEMOGLOBIN (HGB A1C): Hemoglobin A1C: 7.4

## 2016-09-20 NOTE — Patient Instructions (Signed)

## 2016-09-20 NOTE — Progress Notes (Signed)
Subjective:  Patient ID: Nicole [name], female    DOB: 1940/11/19  Age: 76 y.o. MRN: [id number]  CC: Hypertension and Diabetes   HPI Nicole [name] presents for concerns about an episode of hypotension that occurred one day prior to this visit. She was receiving chemotherapy yesterday when her systolic blood pressure went down to 90 and she was near syncopal. She received IV fluids and quickly improved. Over the last few weeks she has had some symptoms associated with chemotherapy including nausea, decreased oral intake, fatigue, and dizziness.  Outpatient Medications Prior to Visit  Medication Sig Dispense Refill  . allopurinol (ZYLOPRIM) 100 MG tablet take 1 tablet by mouth once daily 90 tablet 1  . CALCIUM-VITAMIN D PO Take 1 tablet by mouth daily.     . Cholecalciferol (VITAMIN D3 PO) Take 1 tablet by mouth daily.    . Cyanocobalamin (VITAMIN B-12 CR PO) Take 250 mcg by mouth daily.     . fluconazole (DIFLUCAN) 100 MG tablet Take 1 tablet (100 mg total) by mouth daily. 10 tablet 0  . FREESTYLE LITE test strip TEST once daily as directed 100 each 11  . lidocaine (XYLOCAINE) 2 % solution Use as directed 10 mLs in the mouth or throat every 3 (three) hours as needed for mouth pain. Swish and swallow or spit 200 mL 1  . magic mouthwash SOLN Take 5 mLs by mouth 4 (four) times daily as needed for mouth pain. Use for throat pain also. 250 mL 1  . Misc Natural Products (OSTEO BI-FLEX TRIPLE STRENGTH) TABS Take 1 tablet by mouth daily. Triple Action Joint [employer] by Joline Salt    . mometasone-formoterol (DULERA) 100-5 MCG/ACT AERO Inhale 2 puffs into the lungs 2 times daily at 12 noon and 4 pm. 1 Inhaler 1  . Multiple Vitamins-Minerals (MULTIVITAMIN WITH MINERALS) tablet Take 1 tablet by mouth daily.    [name] omega-3 acid ethyl esters (LOVAZA) 1 g capsule Take 2 capsules (2 g total) by mouth 2 (two) times daily. 120 capsule 11  . oxyCODONE-acetaminophen (PERCOCET/ROXICET) 5-325 MG tablet Take 1 tablet  by mouth every 4 (four) hours as needed for severe pain. 30 tablet 0  . prochlorperazine (COMPAZINE) 10 MG tablet take 1 tablet by mouth every 6 hours if needed for nausea and vomiting 30 tablet 0  . sucralfate (CARAFATE) 1 g tablet Take 1 tablet (1 g total) by mouth 4 (four) times daily -  with meals and at bedtime. Dissolve tablet in 15 ml of water before taking. 120 tablet 0  . Turmeric 450 MG CAPS Take 900 mg by mouth daily.     . Wound Dressings (SONAFINE EX) Apply topically.    . metFORMIN (GLUCOPHAGE-XR) 750 MG 24 hr tablet take 2 tablets by mouth once daily with BREAKFAST 180 tablet 1  . nebivolol (BYSTOLIC) 10 MG tablet Take 1 tablet (10 mg total) by mouth daily. 119 tablet 0  . losartan (COZAAR) 100 MG tablet take 1 tablet by mouth once daily 90 tablet 3   No facility-administered medications prior to visit.     ROS Review of Systems  Constitutional: Positive for appetite change, fatigue and unexpected weight change (wt loss). Negative for activity change, chills, diaphoresis and fever.  HENT: Negative.  Negative for trouble swallowing and voice change.   Eyes: Negative for visual disturbance.  Respiratory: Negative for cough, chest tightness, shortness of breath and wheezing.   Cardiovascular: Negative for chest pain, palpitations and leg swelling.  Gastrointestinal: Positive  for nausea. Negative for abdominal pain, constipation, diarrhea and vomiting.  Endocrine: Negative for polydipsia, polyphagia and polyuria.  Genitourinary: Negative.  Negative for decreased urine volume, difficulty urinating and dysuria.  Musculoskeletal: Negative.  Negative for back pain and myalgias.  Skin: Negative.   Allergic/Immunologic: Negative.   Neurological: Positive for dizziness and light-headedness. Negative for weakness and headaches.  Hematological: Negative for adenopathy. Does not bruise/bleed easily.  Psychiatric/Behavioral: Negative.     Objective:  BP 130/62 (BP Location: Left Arm,  Patient Position: Sitting, Cuff Size: Normal)   Pulse (!) 55   Temp 98.3 F (36.8 C) (Oral)   Resp 16   Ht 5\' 8"  (1.727 m)   Wt 172 lb 4 oz (78.1 kg)   SpO2 99%   BMI 26.19 kg/m   BP Readings from Last 3 Encounters:  09/20/16 130/62  09/19/16 (!) 107/94  09/12/16 118/78    Wt Readings from Last 3 Encounters:  09/20/16 172 lb 4 oz (78.1 kg)  09/12/16 175 lb 14.4 oz (79.8 kg)  08/29/16 181 lb 3.2 oz (82.2 kg)    Physical Exam  Constitutional: She is oriented to person, place, and time. No distress.  HENT:  Mouth/Throat: Oropharynx is clear and moist. No oropharyngeal exudate.  Eyes: Conjunctivae are normal. Right eye exhibits no discharge. Left eye exhibits no discharge. No scleral icterus.  Neck: Normal range of motion. Neck supple. No JVD present. No thyromegaly present.  Cardiovascular: Regular rhythm, S1 normal, S2 normal and intact distal pulses.  Bradycardia present.  Exam reveals no gallop and no friction rub.   No murmur heard. Pulmonary/Chest: Effort normal and breath sounds normal. No respiratory distress. She has no wheezes. She has no rales. She exhibits no tenderness.  Abdominal: Soft. Bowel sounds are normal. She exhibits no distension and no mass. There is no tenderness. There is no rebound and no guarding.  Musculoskeletal: Normal range of motion. She exhibits no edema, tenderness or deformity.  Lymphadenopathy:    She has no cervical adenopathy.  Neurological: She is alert and oriented to person, place, and time.  Skin: Skin is warm and dry. No rash noted. She is not diaphoretic. No erythema. No pallor.  Vitals reviewed.   Lab Results  Component Value Date   WBC 3.0 (L) 09/19/2016   HGB 13.1 09/19/2016   HCT 38.3 09/19/2016   PLT 176 09/19/2016   GLUCOSE 176 (H) 09/19/2016   CHOL 176 06/14/2016   TRIG (H) 06/14/2016    460.0 Triglyceride is over 400; calculations on Lipids are invalid.   HDL 40.70 06/14/2016   LDLDIRECT 74.0 06/14/2016   LDLCALC 89  10/31/2011   ALT 16 09/19/2016   AST 16 09/19/2016   NA 137 09/19/2016   K 4.3 09/19/2016   CL 99 07/06/2016   CREATININE 1.3 (H) 09/19/2016   BUN 16.7 09/19/2016   CO2 21 (L) 09/19/2016   TSH 0.81 06/14/2016   INR 1.0 07/06/2016   HGBA1C 7.4 09/20/2016   MICROALBUR 4.0 (H) 06/14/2016    No results found.  Assessment & Plan:   Aviyanna was seen today for hypertension and diabetes.  Diagnoses and all orders for this visit:  Need for influenza vaccination -     Flu vaccine HIGH DOSE PF (Fluzone High dose)  Type 2 diabetes mellitus with complication, without long-term current use of insulin ([place])- The A1c shows her blood sugar is well controlled at 7.4%. However, since she started chemotherapy she has had a decline in her renal function  and the blood work done prior to this visit shows an elevated anion gap at 17 with a decreased bicarbonate. I therefore asked her to stop taking metformin over concerns that she might be developing a metabolic acidosis. I will recheck her renal function and bicarbonate off of metformin expecting improvement. -     POCT glycosylated hemoglobin (Hb A1C) -     Basic metabolic panel; Future  Essential hypertension- her blood pressure is over controlled and she is bradycardic. Her renal function indicates a recent decline. I have therefore asked her to stop taking the ARB and the beta blocker. She will monitor her blood pressure closely at home and if it rises significantly then I will reconsider restarting an antihypertensive. -     Basic metabolic panel; Future   I have discontinued Ms. Dayhoff's nebivolol, metFORMIN, and losartan. I am also having her maintain her Cyanocobalamin (VITAMIN B-12 CR PO), Turmeric, CALCIUM-VITAMIN D PO, FREESTYLE LITE, mometasone-formoterol, omega-3 acid ethyl esters, multivitamin with minerals, OSTEO BI-FLEX TRIPLE STRENGTH, Cholecalciferol (VITAMIN D3 PO), allopurinol, Wound Dressings (SONAFINE EX), sucralfate,  oxyCODONE-acetaminophen, prochlorperazine, magic mouthwash, lidocaine, and fluconazole.  No orders of the defined types were placed in this encounter.    Follow-up: Return in about 2 months (around 11/20/2016).  Scarlette Calico, MD

## 2016-09-20 NOTE — Telephone Encounter (Signed)
Pharmacist called to clarify directions on MMW and lidocain solution

## 2016-09-21 ENCOUNTER — Emergency Department (HOSPITAL_COMMUNITY)
Admission: EM | Admit: 2016-09-21 | Discharge: 2016-09-21 | Discharge status: Against Medical Advice | Payer: Medicare Other | Attending: Emergency Medicine | Admitting: Emergency Medicine

## 2016-09-21 ENCOUNTER — Other Ambulatory Visit: Payer: Self-pay

## 2016-09-21 ENCOUNTER — Telehealth: Payer: Self-pay | Admitting: Internal Medicine

## 2016-09-21 ENCOUNTER — Encounter: Payer: Self-pay | Admitting: Internal Medicine

## 2016-09-21 DIAGNOSIS — Z5321 Procedure and treatment not carried out due to patient leaving prior to being seen by health care provider: Secondary | ICD-10-CM | POA: Diagnosis not present

## 2016-09-21 DIAGNOSIS — R42 Dizziness and giddiness: Secondary | ICD-10-CM | POA: Diagnosis present

## 2016-09-21 LAB — CBC
HCT: 34.7 % — ABNORMAL LOW (ref 36.0–46.0)
Hemoglobin: 12.1 g/dL (ref 12.0–15.0)
MCH: 31.3 pg (ref 26.0–34.0)
MCHC: 34.9 g/dL (ref 30.0–36.0)
MCV: 89.9 fL (ref 78.0–100.0)
Platelets: 164 10*3/uL (ref 150–400)
RBC: 3.86 MIL/uL — ABNORMAL LOW (ref 3.87–5.11)
RDW: 15 % (ref 11.5–15.5)
WBC: 2 10*3/uL — ABNORMAL LOW (ref 4.0–10.5)

## 2016-09-21 LAB — BASIC METABOLIC PANEL
Anion gap: 6 (ref 5–15)
BUN: 16 mg/dL (ref 6–20)
CO2: 23 mmol/L (ref 22–32)
Calcium: 8.9 mg/dL (ref 8.9–10.3)
Chloride: 108 mmol/L (ref 101–111)
Creatinine, Ser: 0.91 mg/dL (ref 0.44–1.00)
GFR calc Af Amer: >60 mL/min (ref >60)
GFR calc non Af Amer: >60 mL/min (ref >60)
Glucose, Bld: 162 mg/dL — ABNORMAL HIGH (ref 65–99)
Potassium: 3.9 mmol/L (ref 3.5–5.1)
Sodium: 137 mmol/L (ref 135–145)

## 2016-09-21 LAB — CBG MONITORING, ED: Glucose-Capillary: 158 mg/dL — ABNORMAL HIGH (ref 65–99)

## 2016-09-21 NOTE — Telephone Encounter (Signed)
Stay hydrated Come in tomorrow for a BP check and repeat lab work

## 2016-09-21 NOTE — ED Notes (Signed)
Pt told registration that she was leaving and gave labels to registration clerk.

## 2016-09-21 NOTE — ED Triage Notes (Signed)
Pt states that she finished 7 weeks of chemo on Monday and has been monitoring her BP and HR since that time because it was low at her last infusion. States her HR has fluctuated into the 50s and up above the 100s and her BP has been below 291 systolic at home and she has had episodes of dizziness. Alert and oriented.

## 2016-09-21 NOTE — Progress Notes (Signed)
  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   Beams/energy:   3-D // 10X, 15X, 6X  Narrative: The patient tolerated radiation treatment relatively well.   She reports an irritating cough that keeps her up at night and is not taking anything for this cough. She reports food and especially water get suck in her throat. She is taking carafate. She reports fatigue. She denies shortness of breath or skin irritation. She reports weight loss with treatment.   Plan: The patient has completed radiation treatment. The patient will return to radiation oncology clinic for routine followup in one month. I advised them to call or return sooner if they have any questions or concerns related to their recovery or treatment.  -----------------------------------  Blair Promise, PhD, MD  This document serves as a record of services personally performed by Gery Pray, MD. It was created on his behalf by Arlyce Harman, a trained medical scribe. The creation of this record is based on the scribe's personal observations and the provider's statements to them. This document has been checked and approved by the attending provider.

## 2016-09-21 NOTE — Telephone Encounter (Signed)
LVM for Pt to call back to sch BP check. Informed of repeat lab work and to stay hydrated.

## 2016-09-21 NOTE — Telephone Encounter (Signed)
Pt called stating she was told to monitor her BP today it was 90/54 She is concerned and would like to what what to do

## 2016-09-22 ENCOUNTER — Ambulatory Visit (INDEPENDENT_AMBULATORY_CARE_PROVIDER_SITE_OTHER)
Admission: RE | Admit: 2016-09-22 | Discharge: 2016-09-22 | Disposition: A | Discharge status: Home / Self Care | Payer: Medicare Other | Source: Ambulatory Visit | Attending: Internal Medicine | Admitting: Internal Medicine

## 2016-09-22 ENCOUNTER — Ambulatory Visit (INDEPENDENT_AMBULATORY_CARE_PROVIDER_SITE_OTHER): Payer: Medicare Other | Admitting: Internal Medicine

## 2016-09-22 ENCOUNTER — Other Ambulatory Visit (INDEPENDENT_AMBULATORY_CARE_PROVIDER_SITE_OTHER): Payer: Medicare Other

## 2016-09-22 ENCOUNTER — Encounter: Payer: Self-pay | Admitting: Internal Medicine

## 2016-09-22 VITALS — BP 130/84 | HR 98 | Temp 98.1°F | Ht 68.0 in | Wt 171.0 lb

## 2016-09-22 DIAGNOSIS — I959 Hypotension, unspecified: Secondary | ICD-10-CM | POA: Diagnosis not present

## 2016-09-22 DIAGNOSIS — I10 Essential (primary) hypertension: Secondary | ICD-10-CM | POA: Diagnosis not present

## 2016-09-22 DIAGNOSIS — E118 Type 2 diabetes mellitus with unspecified complications: Secondary | ICD-10-CM | POA: Diagnosis not present

## 2016-09-22 DIAGNOSIS — R05 Cough: Secondary | ICD-10-CM

## 2016-09-22 DIAGNOSIS — R059 Cough, unspecified: Secondary | ICD-10-CM

## 2016-09-22 DIAGNOSIS — R0602 Shortness of breath: Secondary | ICD-10-CM | POA: Diagnosis not present

## 2016-09-22 LAB — BASIC METABOLIC PANEL
BUN: 13 mg/dL (ref 6–23)
CO2: 24 mEq/L (ref 19–32)
Calcium: 8.9 mg/dL (ref 8.4–10.5)
Chloride: 104 mEq/L (ref 96–112)
Creatinine, Ser: 0.91 mg/dL (ref 0.40–1.20)
GFR: 64.25 mL/min (ref >60.00)
Glucose, Bld: 155 mg/dL — ABNORMAL HIGH (ref 70–99)
Potassium: 3.9 mEq/L (ref 3.5–5.1)
Sodium: 134 mEq/L — ABNORMAL LOW (ref 135–145)

## 2016-09-22 LAB — CORTISOL: Cortisol, Plasma: 14 ug/dL

## 2016-09-22 NOTE — Progress Notes (Signed)
Subjective:  Patient ID: Nicole [name], female    DOB: 1940/04/09  Age: 76 y.o. MRN: [id number]  CC: Cough   HPI [name] presents for f/up - I saw her a few days ago for concerns about hypotension. She stopped her antihypertensives as well as metformin. One day prior to this visit she felt a little dizzy and rechecked her blood pressure and found that her systolic was about 33-354. She went to the ED but was never seen. They did an EKG which was unremarkable and her lab work did not show any electrolyte imbalance or dehydration and her renal function was normal. She continues to complain of a mild, intermittent cough that's productive of cream colored mucus. She completed chemotherapy has made it difficult for her to swallow and she doesn't have much of an appetite.  Outpatient Medications Prior to Visit  Medication Sig Dispense Refill  . allopurinol (ZYLOPRIM) 100 MG tablet take 1 tablet by mouth once daily 90 tablet 1  . CALCIUM-VITAMIN D PO Take 1 tablet by mouth daily.     . Cholecalciferol (VITAMIN D3 PO) Take 1 tablet by mouth daily.    . Cyanocobalamin (VITAMIN B-12 CR PO) Take 250 mcg by mouth daily.     . fluconazole (DIFLUCAN) 100 MG tablet Take 1 tablet (100 mg total) by mouth daily. 10 tablet 0  . FREESTYLE LITE test strip TEST once daily as directed 100 each 11  . lidocaine (XYLOCAINE) 2 % solution Use as directed 10 mLs in the mouth or throat every 3 (three) hours as needed for mouth pain. Swish and swallow or spit 200 mL 1  . magic mouthwash SOLN Take 5 mLs by mouth 4 (four) times daily as needed for mouth pain. Use for throat pain also. 250 mL 1  . Misc Natural Products (OSTEO BI-FLEX TRIPLE STRENGTH) TABS Take 1 tablet by mouth daily. Triple [employer] by Joline Salt    . mometasone-formoterol (DULERA) 100-5 MCG/ACT AERO Inhale 2 puffs into the lungs 2 times daily at 12 noon and 4 pm. 1 Inhaler 1  . Multiple Vitamins-Minerals (MULTIVITAMIN WITH MINERALS) tablet  Take 1 tablet by mouth daily.    [name] omega-3 acid ethyl esters (LOVAZA) 1 g capsule Take 2 capsules (2 g total) by mouth 2 (two) times daily. 120 capsule 11  . oxyCODONE-acetaminophen (PERCOCET/ROXICET) 5-325 MG tablet Take 1 tablet by mouth every 4 (four) hours as needed for severe pain. 30 tablet 0  . prochlorperazine (COMPAZINE) 10 MG tablet take 1 tablet by mouth every 6 hours if needed for nausea and vomiting 30 tablet 0  . sucralfate (CARAFATE) 1 g tablet Take 1 tablet (1 g total) by mouth 4 (four) times daily -  with meals and at bedtime. Dissolve tablet in 15 ml of water before taking. 120 tablet 0  . Turmeric 450 MG CAPS Take 900 mg by mouth daily.     . Wound Dressings (SONAFINE EX) Apply topically.     No facility-administered medications prior to visit.     ROS Review of Systems  Constitutional: Positive for appetite change. Negative for chills, diaphoresis, fatigue and fever.  HENT: Positive for trouble swallowing.   Eyes: Negative.   Respiratory: Positive for cough. Negative for chest tightness, shortness of breath and wheezing.   Cardiovascular: Negative for chest pain, palpitations and leg swelling.  Gastrointestinal: Negative for abdominal pain, constipation, diarrhea, nausea and vomiting.  Endocrine: Negative.   Genitourinary: Negative.  Negative for decreased urine  volume, difficulty urinating, dysuria and urgency.  Musculoskeletal: Negative.  Negative for back pain, myalgias and neck pain.  Skin: Negative.  Negative for color change and rash.  Allergic/Immunologic: Negative.   Neurological: Positive for dizziness and light-headedness. Negative for weakness and headaches.  Hematological: Negative for adenopathy. Does not bruise/bleed easily.  Psychiatric/Behavioral: Negative.     Objective:  BP 130/84 (BP Location: Left Arm, Patient Position: Sitting, Cuff Size: Normal)   Pulse 98   Temp 98.1 F (36.7 C) (Oral)   Ht 5\' 8"  (1.727 m)   Wt 171 lb (77.6 kg)   SpO2 99%    BMI 26.00 kg/m   BP Readings from Last 3 Encounters:  09/22/16 130/84  09/21/16 94/66  09/20/16 130/62    Wt Readings from Last 3 Encounters:  09/22/16 171 lb (77.6 kg)  09/20/16 172 lb 4 oz (78.1 kg)  09/12/16 175 lb 14.4 oz (79.8 kg)    Physical Exam  Constitutional: She is oriented to person, place, and time. No distress.  HENT:  Mouth/Throat: Oropharynx is clear and moist. No oropharyngeal exudate.  Eyes: Conjunctivae are normal. Right eye exhibits no discharge. Left eye exhibits no discharge. No scleral icterus.  Neck: Normal range of motion. Neck supple. No JVD present. No thyromegaly present.  Cardiovascular: Normal rate, regular rhythm and intact distal pulses.  Exam reveals no gallop and no friction rub.   No murmur heard. Pulmonary/Chest: Effort normal and breath sounds normal. No respiratory distress. She has no wheezes. She has no rales. She exhibits no tenderness.  Abdominal: Soft. Bowel sounds are normal. She exhibits no distension and no mass. There is no tenderness. There is no rebound and no guarding.  Musculoskeletal: Normal range of motion. She exhibits no edema or tenderness.  Lymphadenopathy:    She has no cervical adenopathy.  Neurological: She is alert and oriented to person, place, and time.  Skin: Skin is warm and dry. No rash noted. She is not diaphoretic. No erythema. No pallor.  Psychiatric: She has a normal mood and affect. Her behavior is normal. Judgment and thought content normal.  Vitals reviewed.   Lab Results  Component Value Date   WBC 2.0 (L) 09/21/2016   HGB 12.1 09/21/2016   HCT 34.7 (L) 09/21/2016   PLT 164 09/21/2016   GLUCOSE 155 (H) 09/22/2016   CHOL 176 06/14/2016   TRIG (H) 06/14/2016    460.0 Triglyceride is over 400; calculations on Lipids are invalid.   HDL 40.70 06/14/2016   LDLDIRECT 74.0 06/14/2016   LDLCALC 89 10/31/2011   ALT 16 09/19/2016   AST 16 09/19/2016   NA 134 (L) 09/22/2016   K 3.9 09/22/2016   CL 104  09/22/2016   CREATININE 0.91 09/22/2016   BUN 13 09/22/2016   CO2 24 09/22/2016   TSH 0.81 06/14/2016   INR 1.0 07/06/2016   HGBA1C 7.4 09/20/2016   MICROALBUR 4.0 (H) 06/14/2016    No results found.  Assessment & Plan:   Amelianna was seen today for cough.  Diagnoses and all orders for this visit:  Hypotension, unspecified hypotension type- She has mild hyponatremia but her blood pressure is normal today. Her cortisol level is normal so I don't think she has adrenal insufficiency. Her EKG was normal the night prior to this visit and she doesn't have any cardiovascular sx's so I don't think she is experiencing left ventricular dysfunction. However, if her symptoms continue then I will order an echocardiogram. For now will treat for mild dehydration-  I've asked her to increase her intake of liquids that have sodium and electrolytes. She will continue to stay off her antihypertensives. -     Cortisol; Future  Cough- her lung examination is normal and her chest x-rays normal. She tells me that this is a symptom that started with her recent chemotherapy. I've asked her to discuss this with her oncologist. -     DG Chest 2 View; Future   I am having Ms. Callie Fielding maintain her Cyanocobalamin (VITAMIN B-12 CR PO), Turmeric, CALCIUM-VITAMIN D PO, FREESTYLE LITE, mometasone-formoterol, omega-3 acid ethyl esters, multivitamin with minerals, OSTEO BI-FLEX TRIPLE STRENGTH, Cholecalciferol (VITAMIN D3 PO), allopurinol, Wound Dressings (SONAFINE EX), sucralfate, oxyCODONE-acetaminophen, prochlorperazine, magic mouthwash, lidocaine, and fluconazole.  No orders of the defined types were placed in this encounter.    Follow-up: Return if symptoms worsen or fail to improve.  Scarlette Calico, MD

## 2016-09-22 NOTE — Patient Instructions (Signed)
Cough, Adult Coughing is a reflex that clears your throat and your airways. Coughing helps to heal and protect your lungs. It is normal to cough occasionally, but a cough that happens with other symptoms or lasts a long time may be a sign of a condition that needs treatment. A cough may last only 2-3 weeks (acute), or it may last longer than 8 weeks (chronic). What are the causes? Coughing is commonly caused by:  Breathing in substances that irritate your lungs.  A viral or bacterial respiratory infection.  Allergies.  Asthma.  Postnasal drip.  Smoking.  Acid backing up from the stomach into the esophagus (gastroesophageal reflux).  Certain medicines.  Chronic lung problems, including COPD (or rarely, lung cancer).  Other medical conditions such as heart failure.  Follow these instructions at home: Pay attention to any changes in your symptoms. Take these actions to help with your discomfort:  Take medicines only as told by your health care provider. ? If you were prescribed an antibiotic medicine, take it as told by your health care provider. Do not stop taking the antibiotic even if you start to feel better. ? Talk with your health care provider before you take a cough suppressant medicine.  Drink enough fluid to keep your urine clear or pale yellow.  If the air is dry, use a cold steam vaporizer or humidifier in your bedroom or your home to help loosen secretions.  Avoid anything that causes you to cough at work or at home.  If your cough is worse at night, try sleeping in a semi-upright position.  Avoid cigarette smoke. If you smoke, quit smoking. If you need help quitting, ask your health care provider.  Avoid caffeine.  Avoid alcohol.  Rest as needed.  Contact a health care provider if:  You have new symptoms.  You cough up pus.  Your cough does not get better after 2-3 weeks, or your cough gets worse.  You cannot control your cough with suppressant  medicines and you are losing sleep.  You develop pain that is getting worse or pain that is not controlled with pain medicines.  You have a fever.  You have unexplained weight loss.  You have night sweats. Get help right away if:  You cough up blood.  You have difficulty breathing.  Your heartbeat is very fast. This information is not intended to replace advice given to you by your health care provider. Make sure you discuss any questions you have with your health care provider. Document Released: 07/09/2010 Document Revised: 06/18/2015 Document Reviewed: 03/19/2014 Elsevier Interactive Patient Education  2017 [employer]  

## 2016-09-23 ENCOUNTER — Other Ambulatory Visit: Payer: Medicare Other

## 2016-09-23 ENCOUNTER — Ambulatory Visit (HOSPITAL_BASED_OUTPATIENT_CLINIC_OR_DEPARTMENT_OTHER): Payer: Medicare Other

## 2016-09-23 ENCOUNTER — Other Ambulatory Visit: Payer: Self-pay

## 2016-09-23 ENCOUNTER — Encounter: Payer: Self-pay | Admitting: Internal Medicine

## 2016-09-23 ENCOUNTER — Encounter: Payer: Self-pay | Admitting: Oncology

## 2016-09-23 ENCOUNTER — Telehealth: Payer: Self-pay | Admitting: Oncology

## 2016-09-23 ENCOUNTER — Other Ambulatory Visit: Payer: Self-pay | Admitting: *Deleted

## 2016-09-23 ENCOUNTER — Ambulatory Visit (HOSPITAL_BASED_OUTPATIENT_CLINIC_OR_DEPARTMENT_OTHER): Payer: Medicare Other | Admitting: Medical

## 2016-09-23 ENCOUNTER — Other Ambulatory Visit (HOSPITAL_COMMUNITY): Payer: Self-pay

## 2016-09-23 ENCOUNTER — Emergency Department (HOSPITAL_COMMUNITY)
Admission: EM | Admit: 2016-09-23 | Discharge: 2016-09-23 | Disposition: A | Discharge status: Home / Self Care | Payer: Medicare Other | Attending: Emergency Medicine | Admitting: Emergency Medicine

## 2016-09-23 VITALS — BP 95/62 | HR 98 | Temp 98.2°F | Resp 19 | Ht 68.0 in | Wt 169.5 lb

## 2016-09-23 DIAGNOSIS — C3431 Malignant neoplasm of lower lobe, right bronchus or lung: Secondary | ICD-10-CM

## 2016-09-23 DIAGNOSIS — Z8541 Personal history of malignant neoplasm of cervix uteri: Secondary | ICD-10-CM | POA: Insufficient documentation

## 2016-09-23 DIAGNOSIS — R42 Dizziness and giddiness: Secondary | ICD-10-CM

## 2016-09-23 DIAGNOSIS — I4892 Unspecified atrial flutter: Secondary | ICD-10-CM | POA: Diagnosis not present

## 2016-09-23 DIAGNOSIS — I10 Essential (primary) hypertension: Secondary | ICD-10-CM | POA: Diagnosis not present

## 2016-09-23 DIAGNOSIS — R002 Palpitations: Secondary | ICD-10-CM | POA: Diagnosis present

## 2016-09-23 DIAGNOSIS — Z79899 Other long term (current) drug therapy: Secondary | ICD-10-CM | POA: Insufficient documentation

## 2016-09-23 DIAGNOSIS — I951 Orthostatic hypotension: Secondary | ICD-10-CM | POA: Diagnosis not present

## 2016-09-23 DIAGNOSIS — R197 Diarrhea, unspecified: Secondary | ICD-10-CM

## 2016-09-23 DIAGNOSIS — Z85118 Personal history of other malignant neoplasm of bronchus and lung: Secondary | ICD-10-CM | POA: Insufficient documentation

## 2016-09-23 DIAGNOSIS — E119 Type 2 diabetes mellitus without complications: Secondary | ICD-10-CM | POA: Insufficient documentation

## 2016-09-23 HISTORY — DX: Personal history of irradiation: Z92.3

## 2016-09-23 LAB — COMPREHENSIVE METABOLIC PANEL WITH GFR
ALT: 15 U/L (ref 0–55)
AST: 16 U/L (ref 5–34)
Albumin: 3 g/dL — ABNORMAL LOW (ref 3.5–5.0)
Alkaline Phosphatase: 45 U/L (ref 40–150)
Anion Gap: 8 meq/L (ref 3–11)
BUN: 12.1 mg/dL (ref 7.0–26.0)
CO2: 24 meq/L (ref 22–29)
Calcium: 9.2 mg/dL (ref 8.4–10.4)
Chloride: 104 meq/L (ref 98–109)
Creatinine: 0.9 mg/dL (ref 0.6–1.1)
EGFR: 60 mL/min/{1.73_m2} — ABNORMAL LOW
Glucose: 140 mg/dL (ref 70–140)
Potassium: 4 meq/L (ref 3.5–5.1)
Sodium: 135 meq/L — ABNORMAL LOW (ref 136–145)
Total Bilirubin: 0.63 mg/dL (ref 0.20–1.20)
Total Protein: 6.8 g/dL (ref 6.4–8.3)

## 2016-09-23 LAB — CBC WITH DIFFERENTIAL/PLATELET
BASO%: 0.6 % (ref 0.0–2.0)
Basophils Absolute: 0 10*3/uL (ref 0.0–0.1)
Basophils Absolute: 0 10*3/uL (ref 0.0–0.1)
Basophils Relative: 0 %
EOS%: 1.7 % (ref 0.0–7.0)
Eosinophils Absolute: 0 10*3/uL (ref 0.0–0.5)
Eosinophils Absolute: 0 10*3/uL (ref 0.0–0.7)
Eosinophils Relative: 2 %
HCT: 37.3 % (ref 34.8–46.6)
HCT: 36.2 % (ref 36.0–46.0)
HGB: 12.6 g/dL (ref 11.6–15.9)
Hemoglobin: 12.5 g/dL (ref 12.0–15.0)
LYMPH%: 16.1 % (ref 14.0–49.7)
Lymphocytes Relative: 16 %
Lymphs Abs: 0.3 10*3/uL — ABNORMAL LOW (ref 0.7–4.0)
MCH: 31 pg (ref 25.1–34.0)
MCH: 31.3 pg (ref 26.0–34.0)
MCHC: 33.8 g/dL (ref 31.5–36.0)
MCHC: 34.5 g/dL (ref 30.0–36.0)
MCV: 91.9 fL (ref 79.5–101.0)
MCV: 90.7 fL (ref 78.0–100.0)
MONO#: 0.1 10*3/uL (ref 0.1–0.9)
MONO%: 6.3 % (ref 0.0–14.0)
Monocytes Absolute: 0.2 10*3/uL (ref 0.1–1.0)
Monocytes Relative: 9 %
NEUT#: 1.3 10*3/uL — ABNORMAL LOW (ref 1.5–6.5)
NEUT%: 75.3 % (ref 38.4–76.8)
Neutro Abs: 1.4 10*3/uL — ABNORMAL LOW (ref 1.7–7.7)
Neutrophils Relative %: 73 %
Platelets: 176 10*3/uL (ref 145–400)
Platelets: 195 10*3/uL (ref 150–400)
RBC: 4.06 10*6/uL (ref 3.70–5.45)
RBC: 3.99 MIL/uL (ref 3.87–5.11)
RDW: 14.9 % — ABNORMAL HIGH (ref 11.2–14.5)
RDW: 15 % (ref 11.5–15.5)
WBC: 1.7 10*3/uL — ABNORMAL LOW (ref 3.9–10.3)
WBC: 2 10*3/uL — ABNORMAL LOW (ref 4.0–10.5)
lymph#: 0.3 10*3/uL — ABNORMAL LOW (ref 0.9–3.3)

## 2016-09-23 LAB — BASIC METABOLIC PANEL WITH GFR
Anion gap: 10 (ref 5–15)
BUN: 13 mg/dL (ref 6–20)
CO2: 22 mmol/L (ref 22–32)
Calcium: 8.9 mg/dL (ref 8.9–10.3)
Chloride: 102 mmol/L (ref 101–111)
Creatinine, Ser: 0.94 mg/dL (ref 0.44–1.00)
GFR calc Af Amer: >60 mL/min
GFR calc non Af Amer: 59 mL/min — ABNORMAL LOW
Glucose, Bld: 130 mg/dL — ABNORMAL HIGH (ref 65–99)
Potassium: 3.7 mmol/L (ref 3.5–5.1)
Sodium: 134 mmol/L — ABNORMAL LOW (ref 135–145)

## 2016-09-23 LAB — MAGNESIUM: Magnesium: 1.9 mg/dL (ref 1.5–2.5)

## 2016-09-23 MED ORDER — DILTIAZEM HCL ER COATED BEADS 120 MG PO CP24: 120.0000 mg | ORAL_CAPSULE | Freq: Every day | ORAL | 0 refills | Status: DC

## 2016-09-23 MED ORDER — APIXABAN 5 MG PO TABS: 5.0000 mg | ORAL_TABLET | Freq: Two times a day (BID) | ORAL | 0 refills | Status: DC

## 2016-09-23 MED ORDER — DILTIAZEM HCL 25 MG/5ML IV SOLN
20.0000 mg | Freq: Once | INTRAVENOUS | Status: AC
  Administered 2016-09-23: 20 mg via INTRAVENOUS
  Filled 2016-09-23: qty 5

## 2016-09-23 MED ORDER — DILTIAZEM LOAD VIA INFUSION: 20.0000 mg | Freq: Once | INTRAVENOUS | Status: DC

## 2016-09-23 MED ORDER — ALBUTEROL SULFATE HFA 108 (90 BASE) MCG/ACT IN AERS
2.0000 | INHALATION_SPRAY | Freq: Once | RESPIRATORY_TRACT | Status: AC
  Administered 2016-09-23: 2 via RESPIRATORY_TRACT
  Filled 2016-09-23: qty 6.7

## 2016-09-23 NOTE — ED Notes (Signed)
Bed: WA17 Expected date:  Expected time:  Means of arrival:  Comments: Hold

## 2016-09-23 NOTE — Telephone Encounter (Signed)
Patient called and said she is having a lot of nausea with occasional vomiting and is also having diarrhea (5 times this morning).  She said she is dizzy when she stands up and is wondering if she is dehydrated.  She is taking compazine q 6 hours and percocet q 4 hours for esophagitis.  Advised her that she can take Imodium over the counter for the diarrhea and that I will contact Dr. Worthy Flank nurse as she had chemotherapy on 09/19/16.

## 2016-09-23 NOTE — Progress Notes (Signed)
Pt comes with c/o dizzyness, diarrhea and nausea.  States her heart has been up and down. Checked VS sitting and standing. Pt is orthostatic and heart very irregular 35-140. STAT EKG ordered. Sandi Mealy, PA made aware.. Denies chest pain,SOB is at baseline.  Pt in [place]. Sandi Mealy, Pa reviewed EKG. Arrangements made to send pt to ED. Transported to ED room #17, via w/c with her daughter with her. Report given to ED RN.

## 2016-09-23 NOTE — ED Provider Notes (Signed)
[facility] DEPT Provider Note   CSN: [id number] Arrival date & time: 09/23/16  1320     History   Chief Complaint Chief Complaint  Patient presents with  . Palpitations    HPI Nicole [name] is a 76 y.o. female.  HPI Patient is a 76 year old female with a history of lung cancer for which she is just completed chemotherapy therapy.  She underwent radiation prior to this.  She is being seen and evaluated at the cancer center and was noted to have atrial flutter and thus was sent to the emergency department for further evaluation.  On arrival to the emergency department the patient is with a variable atrial flutter.  No palpitations.  She does report some persistent cough for the past several weeks but underwent a chest x-ray to her primary care doctor's office yesterday which demonstrated no acute abnormality.  She reports no shortness of breath at this time.  No chest pain or chest tightness.  She does not drink caffeine.  No other recent change in her medications.  No history of atrial fibrillation or atrial flutter.  Symptoms are mild in severity at this time.   Past Medical History:  Diagnosis Date  . Adenocarcinoma of right lung, stage 3 ([place]) 07/28/2016  . Bronchitis    hx of  . Cancer [facility]) 2004   uterine/cervical  . Diabetes mellitus    type 2  . Gallstones   . Headache   . History of radiation therapy 08/09/2016 to 09/19/2016   Right lung was treated to 60 Gy in 30 fractions at 2 Gy per fraction  . Hypertension   . Low back pain   . Lung mass    with lymphadenopathy  . Osteoarthritis     Patient Active Problem List   Diagnosis Date Noted  . Hypotension 09/22/2016  . Cough 09/22/2016  . Adenocarcinoma of right lung, stage 3 ([place]) 07/28/2016  . Encounter for antineoplastic chemotherapy 07/28/2016  . Goals of care, counseling/discussion 07/28/2016  . Pancreatic cyst 11/09/2015  . Estrogen deficiency 11/09/2015  . Routine general medical examination at a health  care facility 06/30/2014  . Gout 01/18/2013  . Hypertriglyceridemia 08/22/2012  . Obesity (BMI 30-39.9) 08/22/2012  . Osteopenia 10/31/2011  . Type II diabetes mellitus with manifestations ([place]) 01/20/2009  . Essential hypertension 01/20/2009  . OSTEOARTHRITIS 01/20/2009  . LOW BACK PAIN 01/20/2009    Past Surgical History:  Procedure Laterality Date  . ABDOMINAL HYSTERECTOMY  2004  . APPENDECTOMY  when 76 years old  . COLONOSCOPY  08-05-09   [name]  . INCONTINENCE SURGERY    . KNEE ARTHROSCOPY Left   . LEFT HEART CATHETERIZATION WITH CORONARY ANGIOGRAM N/A 07/19/2013   Procedure: LEFT HEART CATHETERIZATION WITH CORONARY ANGIOGRAM;  Surgeon: Sinclair Grooms, MD;  Location: [facility] CATH LAB;  Service: Cardiovascular;  Laterality: N/A;  . POLYPECTOMY  08-05-09   2 polyps  . TOTAL KNEE ARTHROPLASTY Left 03/28/2012   Procedure: LEFT TOTAL KNEE ARTHROPLASTY;  Surgeon: Tobi Bastos, MD;  Location: [facility] ORS;  Service: Orthopedics;  Laterality: Left;  . TUBAL LIGATION    . VIDEO BRONCHOSCOPY WITH ENDOBRONCHIAL ULTRASOUND N/A 07/11/2016   Procedure: VIDEO BRONCHOSCOPY WITH ENDOBRONCHIAL ULTRASOUND;  Surgeon: Marshell Garfinkel, MD;  Location: [facility];  Service: Pulmonary;  Laterality: N/A;    OB History    No data available       Home Medications    Prior to Admission medications   Medication Sig Start Date End  Date Taking? Authorizing Provider  Multiple Vitamins-Minerals (MULTIVITAMIN WITH MINERALS) tablet Take 1 tablet by mouth daily.   Yes [provider]  oxyCODONE-acetaminophen (PERCOCET/ROXICET) 5-325 MG tablet Take 1 tablet by mouth every 4 (four) hours as needed for severe pain. 09/06/16  Yes Gery Pray, MD  prochlorperazine (COMPAZINE) 10 MG tablet take 1 tablet by mouth every 6 hours if needed for nausea and vomiting 09/11/16  Yes Curt Bears, MD  Turmeric 450 MG CAPS Take 900 mg by mouth daily.    Yes [provider]  allopurinol (ZYLOPRIM) 100 MG tablet  take 1 tablet by mouth once daily 07/27/16   [name], MD  fluconazole (DIFLUCAN) 100 MG tablet Take 1 tablet (100 mg total) by mouth daily. 09/19/16   Curt Bears, MD  FREESTYLE LITE test strip TEST once daily as directed 03/27/16   [name], MD  lidocaine (XYLOCAINE) 2 % solution Use as directed 10 mLs in the mouth or throat every 3 (three) hours as needed for mouth pain. Swish and swallow or spit Patient not taking: Reported on 09/23/2016 09/19/16   Harle Stanford., PA-C  magic mouthwash SOLN Take 5 mLs by mouth 4 (four) times daily as needed for mouth pain. Use for throat pain also. Patient not taking: Reported on 09/23/2016 09/19/16   Harle Stanford., PA-C  mometasone-formoterol [facility]) 100-5 MCG/ACT AERO Inhale 2 puffs into the lungs 2 times daily at 12 noon and 4 pm. Patient not taking: Reported on 09/23/2016 04/13/16   Nche, Charlene Brooke, NP  omega-3 acid ethyl esters (LOVAZA) 1 g capsule Take 2 capsules (2 g total) by mouth 2 (two) times daily. Patient not taking: Reported on 09/23/2016 06/15/16   [name], MD  sucralfate (CARAFATE) 1 g tablet Take 1 tablet (1 g total) by mouth 4 (four) times daily -  with meals and at bedtime. Dissolve tablet in 15 ml of water before taking. Patient not taking: Reported on 09/23/2016 08/25/16   Gery Pray, MD  Wound Dressings (SONAFINE EX) Apply topically.    [provider]    Family History Family History  Problem Relation Age of Onset  . Heart attack Father   . Heart disease Father   . Arthritis Other   . Cancer Other        colon, lst degree relative  . Diabetes Other        st degree relative  . Hyperlipidemia Other   . Hypertension Other   . Colon cancer Neg Hx     Social History Social History  Substance Use Topics  . Smoking status: Never Smoker  . Smokeless tobacco: Never Used  . Alcohol use No     Allergies   No known allergies   Review of Systems Review of Systems  All other systems reviewed and  are negative.    Physical Exam Updated Vital Signs BP 102/73   Pulse 94   Temp 98.2 F (36.8 C)   Resp 19   Ht 5' 8" (1.727 m)   Wt 76.7 kg (169 lb)   SpO2 97%   BMI 25.70 kg/m   Physical Exam  Constitutional: She is oriented to person, place, and time. She appears well-developed and well-nourished. No distress.  HENT:  Head: Normocephalic and atraumatic.  Eyes: EOM are normal.  Neck: Normal range of motion.  Cardiovascular: Normal heart sounds.   Tachycardia.  Irregularly irregular.  Pulmonary/Chest: Effort normal and breath sounds normal.  Abdominal: Soft. She exhibits  no distension. There is no tenderness.  Musculoskeletal: Normal range of motion.  Neurological: She is alert and oriented to person, place, and time.  Skin: Skin is warm and dry.  Psychiatric: She has a normal mood and affect. Judgment normal.  Nursing note and vitals reviewed.    ED Treatments / Results  Labs (all labs ordered are listed, but only abnormal results are displayed) Labs Reviewed  CBC WITH DIFFERENTIAL/PLATELET - Abnormal; Notable for the following:       Result Value   WBC 2.0 (*)    Neutro Abs 1.4 (*)    Lymphs Abs 0.3 (*)    All other components within normal limits  BASIC METABOLIC PANEL - Abnormal; Notable for the following:    Sodium 134 (*)    Glucose, Bld 130 (*)    GFR calc non Af Amer 59 (*)    All other components within normal limits    EKG ECG interpretation #1   Date: 09/23/2016  Rate: 161  Rhythm: variable atrial flutter  QRS Axis: normal  Intervals: normal  ST/T Wave abnormalities: Nonspecific ST and T wave changes likely related to rate   Conduction Disutrbances: none  Narrative Interpretation:   Old EKG Reviewed: chnaged from prior ecg   ECG interpretation #2   Date: 09/23/2016  Rate: 99  Rhythm: normal sinus rhythm  QRS Axis: normal  Intervals: normal  ST/T Wave abnormalities: normal  Conduction Disutrbances: none  Narrative Interpretation:    Old EKG Reviewed: resolution of atrial flutter     Radiology Dg Chest 2 View  Result Date: 09/22/2016 CLINICAL DATA:  Cough and shortness of breath. EXAM: CHEST  2 VIEW COMPARISON:  July 11, 2016 FINDINGS: The heart, hila, and mediastinum are unchanged unremarkable. Mild atelectasis in the bases. No nodules or masses. No focal infiltrates. No acute abnormalities. IMPRESSION: No active cardiopulmonary disease. Electronically Signed   By: Dorise Bullion III M.D   On: 09/22/2016 20:56    Procedures Procedures (including critical care time)  Medications Ordered in ED Medications  diltiazem (CARDIZEM) injection 20 mg (20 mg Intravenous Given 09/23/16 1419)     Initial Impression / Assessment and Plan / ED Course  I have reviewed the triage vital signs and the nursing notes.  Pertinent labs & imaging results that were available during my care of the patient were reviewed by me and considered in my medical decision making (see chart for details).     CHA2DS2/VAS Stroke Risk Points  = 4  Patient atrial flutter on arrival emergency department.  Resolved with IV Cardizem.  Patient be started on anticoagulation as to follow-up in the A. fib clinic.  She be given standard precautions for anticoagulant use.  She feels good.  Discharge home in good condition.       Final Clinical Impressions(s) / ED Diagnoses   Final diagnoses:  Atrial flutter, unspecified type [facility])    New Prescriptions Discharge Medication List as of 09/23/2016  4:23 PM    START taking these medications   Details  apixaban (ELIQUIS) 5 MG TABS tablet Take 1 tablet (5 mg total) by mouth 2 (two) times daily., Starting Fri 09/23/2016, Print    diltiazem (CARDIZEM CD) 120 MG 24 hr capsule Take 1 capsule (120 mg total) by mouth daily., Starting Fri 09/23/2016, Print         Jola Schmidt, MD 09/23/16 1723

## 2016-09-23 NOTE — Telephone Encounter (Signed)
Patient called and agreed to come in for labs at 11:45 and then for Symptom Management at 12:30.

## 2016-09-23 NOTE — ED Triage Notes (Signed)
Patient states she had been having issues with BP dropping and came to the Ed last night, but left due to long wait. Patient states she noted that her pulse was all over the place and called the [facility] and went -had an EKG and was sent to the ED. paatient c/o dizziness when she moves.

## 2016-09-23 NOTE — Progress Notes (Signed)
Symptoms Management Clinic Progress Note   Nicole [name] [id number] 03/16/1940 76 y.o. 09/23/2016 2:52 PM  Nicole [name] is managed by Dr. Fanny Bien. Nicole [name]  Actively treated with chemotherapy: yes  Current Therapy: Carboplatinum and Taxol, the patient has just completed radiation therapy.  Last Treated: 09/19/2016  Recent Review Flowsheet Data    Oncology Flowsheet Day, Cycle CARBOplatin (PARAPLATIN) IV dexamethasone (DECADRON) IJ dexamethasone (DECADRON) IV enoxaparin (LOVENOX) [place] methylPREDNISolone acetate (DEPO-MEDROL) IM methylPREDNISolone sodium succinate 125 mg/2 mL (SOLU-MEDROL) IV ondansetron (ZOFRAN) IV PACLitaxel (TAXOL) IV palonosetron (ALOXI) IV scopolamine 1 MG/3DAYS (TRANSDERM-SCOP) TD   06/01/2011 - - - - - 120 mg - - - - -   01/18/2013 - - - - - 120 mg - - - - -   07/18/2013 - - - - 40 mg - - - - - -   07/11/2016 - - 10 mg - - - - 4 mg - - 1.5 mg   08/08/2016 Day 1, Cycle 1 160 mg - 20 mg - - 125 mg - 45 mg/m2 0.25 mg -   08/15/2016 Day 1, Cycle 2 160 mg - 20 mg - - - - 45 mg/m2 0.25 mg -   08/22/2016 Day 1, Cycle 3 160 mg - 20 mg - - - - 45 mg/m2 0.25 mg -   08/29/2016 Day 1, Cycle 4 160 mg - 20 mg - - - - 45 mg/m2 0.25 mg -   09/05/2016 Day 1, Cycle 5 160 mg - 20 mg - - - - 45 mg/m2 0.25 mg -   09/12/2016 Day 1, Cycle 6 160 mg - 20 mg - - - - 45 mg/m2 0.25 mg -   09/19/2016 Day 1, Cycle 7 160 mg - 20 mg - - - - 45 mg/m2 0.25 mg -     View Complete Flowsheet      chemotherapy flow sheet  Assessment/Plan:   Atrial flutter with rapid ventricular response (HCC)   EKG: Atrial flutter with ventricular response with rate from 149 to 52 bpm The patient was taken to the [facility] emergency room for evaluation and management of her new onset atrial flutter with rapid ventricular response.  Please see After Visit Summary for patient specific instructions.  Future Appointments Date Time Provider [place]  10/12/2016 11:15 AM [facility] 2 [facility] None    10/12/2016 11:45 AM Curt Bears, MD [facility] None  10/12/2016 1:30 PM Gery Pray, MD [facility] None    No orders of the defined types were placed in this encounter.      Subjective:   Patient ID:  Nicole [name] is a 76 y.o. (DOB 05-Aug-1940) female.  Chief Complaint:  Chief Complaint  Patient presents with  . Diarrhea    HPI Nicole [name] presents to the office today with a compliant of dizziness with an irregular and rapid pulse. She also reports having diarrhea. So far she has had 5-6 watery stools this morning. She has a cough with clear sputum. She reports that she continues to have esophagitis with pain with swallowing. She is using oxycodone. She's also been having nausea and vomiting. She was seen in the emergency room at [facility] last evening with an EKG completed which showed a sinus rhythm at 90 bpm. Labs were collected. The patient left prior to being seen due to an anticipated long wait. She presents today with a continuation of her symptoms. An EKG returned showing a ventricular rate from 149-162 with EKG  showing atrial flutter with variable AV block. She has been having orthostatic dizziness. She was seen by her primary care provider Dr. Scarlette Calico with her losartan 100 mg once daily and nebivolol 10 mg once daily discontinued on Monday.  Medications: I have reviewed the patient's current medications.  Allergies:  Allergies  Allergen Reactions  . No Known Allergies     Past Medical History:  Diagnosis Date  . Adenocarcinoma of right lung, stage 3 ([place]) 07/28/2016  . Bronchitis    hx of  . Cancer [facility]) 2004   uterine/cervical  . Diabetes mellitus    type 2  . Gallstones   . Headache   . History of radiation therapy 08/09/2016 to 09/19/2016   Right lung was treated to 60 Gy in 30 fractions at 2 Gy per fraction  . Hypertension   . Low back pain   . Lung mass    with lymphadenopathy  . Osteoarthritis     Past Surgical History:  Procedure  Laterality Date  . ABDOMINAL HYSTERECTOMY  2004  . APPENDECTOMY  when 76 years old  . COLONOSCOPY  08-05-09   [name]  . INCONTINENCE SURGERY    . KNEE ARTHROSCOPY Left   . LEFT HEART CATHETERIZATION WITH CORONARY ANGIOGRAM N/A 07/19/2013   Procedure: LEFT HEART CATHETERIZATION WITH CORONARY ANGIOGRAM;  Surgeon: Sinclair Grooms, MD;  Location: [facility] CATH LAB;  Service: Cardiovascular;  Laterality: N/A;  . POLYPECTOMY  08-05-09   2 polyps  . TOTAL KNEE ARTHROPLASTY Left 03/28/2012   Procedure: LEFT TOTAL KNEE ARTHROPLASTY;  Surgeon: Tobi Bastos, MD;  Location: [facility] ORS;  Service: Orthopedics;  Laterality: Left;  . TUBAL LIGATION    . VIDEO BRONCHOSCOPY WITH ENDOBRONCHIAL ULTRASOUND N/A 07/11/2016   Procedure: VIDEO BRONCHOSCOPY WITH ENDOBRONCHIAL ULTRASOUND;  Surgeon: Marshell Garfinkel, MD;  Location: [facility];  Service: Pulmonary;  Laterality: N/A;   Family History  Problem Relation Age of Onset  . Heart attack Father   . Heart disease Father   . Arthritis Other   . Cancer Other        colon, lst degree relative  . Diabetes Other        st degree relative  . Hyperlipidemia Other   . Hypertension Other   . Colon cancer Neg Hx     Social History   Social History  . Marital status: Married    Spouse name: N/A  . Number of children: N/A  . Years of education: N/A   Occupational History  . retired Retired   Social History Main Topics  . Smoking status: Never Smoker  . Smokeless tobacco: Never Used  . Alcohol use No  . Drug use: No  . Sexual activity: Not Currently   Other Topics Concern  . Not on file   Social History Narrative   Regular exercise- Yes    Past Medical History, Surgical history, Social history, and Family history were reviewed and updated as appropriate.   Please see review of systems for further details on the patient's review from today.   Review of Systems:  Review of Systems  Constitutional: Positive for appetite change. Negative for chills,  diaphoresis and fever.  Respiratory: Positive for cough.   Cardiovascular: Positive for palpitations. Negative for chest pain and leg swelling.  Gastrointestinal: Positive for diarrhea, nausea and vomiting.  Neurological: Positive for dizziness.    Objective:   Physical Exam:  BP 95/62 (BP Location: Right Arm, Patient Position: Standing)   Pulse 98  Temp 98.2 F (36.8 C) (Oral)   Resp 19   Ht _0  (1.727 m)   Wt 169 lb 8 oz (76.9 kg)   SpO2 99%   BMI 25.77 kg/m  ECOG: 2  Physical Exam  Constitutional: No distress.  HENT:  Head: Normocephalic and atraumatic.  Mouth/Throat: Oropharynx is clear and moist. No oropharyngeal exudate.  Eyes: Right eye exhibits no discharge. Left eye exhibits no discharge. No scleral icterus.  Cardiovascular: An irregularly irregular rhythm present. Tachycardia present.   Pulmonary/Chest: Effort normal and breath sounds normal. No respiratory distress. She has no wheezes. She has no rales.  Musculoskeletal: She exhibits no edema.  Neurological: She is alert. Coordination normal.  Skin: Skin is warm and dry. She is not diaphoretic.  Psychiatric: She has a normal mood and affect. Her behavior is normal. Judgment and thought content normal.    Lab Review:     Component Value Date/Time   NA 135 (L) 09/23/2016 1124   K 4.0 09/23/2016 1124   CL 104 09/22/2016 1548   CO2 24 09/23/2016 1124   GLUCOSE 140 09/23/2016 1124   BUN 12.1 09/23/2016 1124   CREATININE 0.9 09/23/2016 1124   CALCIUM 9.2 09/23/2016 1124   PROT 6.8 09/23/2016 1124   ALBUMIN 3.0 (L) 09/23/2016 1124   AST 16 09/23/2016 1124   ALT 15 09/23/2016 1124   ALKPHOS 45 09/23/2016 1124   BILITOT 0.63 09/23/2016 1124   GFRNONAA >60 09/21/2016 2000   GFRAA >60 09/21/2016 2000       Component Value Date/Time   WBC 2.0 (L) 09/23/2016 1322   RBC 3.99 09/23/2016 1322   HGB 12.5 09/23/2016 1322   HGB 12.6 09/23/2016 1124   HCT 36.2 09/23/2016 1322   HCT 37.3 09/23/2016 1124   PLT  195 09/23/2016 1322   PLT 176 09/23/2016 1124   MCV 90.7 09/23/2016 1322   MCV 91.9 09/23/2016 1124   MCH 31.3 09/23/2016 1322   MCHC 34.5 09/23/2016 1322   RDW 15.0 09/23/2016 1322   RDW 14.9 (H) 09/23/2016 1124   LYMPHSABS 0.3 (L) 09/23/2016 1322   LYMPHSABS 0.3 (L) 09/23/2016 1124   MONOABS 0.2 09/23/2016 1322   MONOABS 0.1 09/23/2016 1124   EOSABS 0.0 09/23/2016 1322   EOSABS 0.0 09/23/2016 1124   BASOSABS 0.0 09/23/2016 1322   BASOSABS 0.0 09/23/2016 1124   -------------------------------  Imaging from last 24 hours (if applicable):  Radiology interpretation: Dg Chest 2 View  Result Date: 09/22/2016 CLINICAL DATA:  Cough and shortness of breath. EXAM: CHEST  2 VIEW COMPARISON:  July 11, 2016 FINDINGS: The heart, hila, and mediastinum are unchanged unremarkable. Mild atelectasis in the bases. No nodules or masses. No focal infiltrates. No acute abnormalities. IMPRESSION: No active cardiopulmonary disease. Electronically Signed   By: Dorise Bullion III M.D   On: 09/22/2016 20:56        This patient was seen with Dr. Burr Medico with my treatment plan reviewed with her. She expressed agreement with my medical management of this patient.  Attending Addendum I have seen the patient, examined her. I agree with the assessment and and plan and have edited the notes.   76 yo female, currently on concurrent chemo RT for stage III NSCLC, presented with diarrhea and dizziness. EKG reviewed, atrial flutter with RVR, pt is hemodynamically stable, will send her to ED for further management. She agrees.   Truitt Merle  09/26/2016

## 2016-09-23 NOTE — Telephone Encounter (Signed)
Left a message for patient with appointment for lab at 12:00 and Symptom Management at 12:30 today.  Requested a return call.

## 2016-09-26 ENCOUNTER — Encounter: Payer: Self-pay | Admitting: Medical

## 2016-09-27 ENCOUNTER — Ambulatory Visit: Payer: Medicare Other | Admitting: Pulmonary Disease

## 2016-09-27 ENCOUNTER — Telehealth: Payer: Self-pay | Admitting: Internal Medicine

## 2016-09-27 DIAGNOSIS — I959 Hypotension, unspecified: Secondary | ICD-10-CM

## 2016-09-27 NOTE — Telephone Encounter (Signed)
Patient call back and has an appt with a cardiologists next Wednesday. She has been off of her diabetes medicine since the end of last week and BP medicine sine last Monday  Please all back in regard   BP today is 136/95  Atrial fibrillation is 104

## 2016-09-27 NOTE — Telephone Encounter (Signed)
Pt called stating she went to the hospital and had a fluttery heart. I  Made her an appointment for 9/13, she states she is having BP issues and has not been taking her medication. She would like some guidance and would like a call back

## 2016-09-27 NOTE — Telephone Encounter (Signed)
There has been a referral for the Afib clinic. Do you want a referral to cardiology as well?

## 2016-09-27 NOTE — Telephone Encounter (Signed)
She needs to see a cardiologist asap

## 2016-09-28 NOTE — Telephone Encounter (Signed)
Called pt and gave MD response.  Informed pt to continue as instructed by MD at last visit.

## 2016-09-28 NOTE — Telephone Encounter (Signed)
Those numbers are good What else does she need?

## 2016-09-30 DIAGNOSIS — E119 Type 2 diabetes mellitus without complications: Secondary | ICD-10-CM | POA: Diagnosis not present

## 2016-09-30 DIAGNOSIS — H43813 Vitreous degeneration, bilateral: Secondary | ICD-10-CM | POA: Diagnosis not present

## 2016-09-30 DIAGNOSIS — H40013 Open angle with borderline findings, low risk, bilateral: Secondary | ICD-10-CM | POA: Diagnosis not present

## 2016-09-30 DIAGNOSIS — H2513 Age-related nuclear cataract, bilateral: Secondary | ICD-10-CM | POA: Diagnosis not present

## 2016-09-30 LAB — HM DIABETES EYE EXAM

## 2016-10-03 ENCOUNTER — Telehealth: Payer: Self-pay | Admitting: Oncology

## 2016-10-03 ENCOUNTER — Other Ambulatory Visit: Payer: Self-pay | Admitting: Internal Medicine

## 2016-10-03 NOTE — Telephone Encounter (Signed)
Patient called and requested a refill on oxycodone/acetaminophen.  This medication was last filled on 09/06/16.  Advised her that Dr. Sondra Come is off today but will be notified tomorrow morning. She verbalized understanding and agreement.

## 2016-10-04 ENCOUNTER — Other Ambulatory Visit: Payer: Self-pay | Admitting: Radiation Oncology

## 2016-10-04 DIAGNOSIS — C3491 Malignant neoplasm of unspecified part of right bronchus or lung: Secondary | ICD-10-CM

## 2016-10-04 MED ORDER — OXYCODONE-ACETAMINOPHEN 5-325 MG PO TABS: 1.0000 | ORAL_TABLET | ORAL | 0 refills | Status: DC | PRN

## 2016-10-04 NOTE — Telephone Encounter (Signed)
Called patient and advised her that her pain medication prescription is ready for pick up in the Radiation Oncology nursing area.

## 2016-10-05 ENCOUNTER — Encounter: Payer: Self-pay | Admitting: Interventional Cardiology

## 2016-10-05 ENCOUNTER — Ambulatory Visit (INDEPENDENT_AMBULATORY_CARE_PROVIDER_SITE_OTHER): Payer: Medicare Other | Admitting: Interventional Cardiology

## 2016-10-05 ENCOUNTER — Ambulatory Visit (HOSPITAL_COMMUNITY): Payer: Medicare Other | Attending: Cardiovascular Disease

## 2016-10-05 ENCOUNTER — Other Ambulatory Visit: Payer: Self-pay

## 2016-10-05 VITALS — BP 118/72 | HR 105 | Ht 68.0 in | Wt 166.0 lb

## 2016-10-05 DIAGNOSIS — I4892 Unspecified atrial flutter: Secondary | ICD-10-CM

## 2016-10-05 DIAGNOSIS — Z85118 Personal history of other malignant neoplasm of bronchus and lung: Secondary | ICD-10-CM | POA: Insufficient documentation

## 2016-10-05 DIAGNOSIS — I080 Rheumatic disorders of both mitral and aortic valves: Secondary | ICD-10-CM | POA: Diagnosis not present

## 2016-10-05 DIAGNOSIS — C3491 Malignant neoplasm of unspecified part of right bronchus or lung: Secondary | ICD-10-CM | POA: Diagnosis not present

## 2016-10-05 DIAGNOSIS — I10 Essential (primary) hypertension: Secondary | ICD-10-CM

## 2016-10-05 DIAGNOSIS — E781 Pure hyperglyceridemia: Secondary | ICD-10-CM

## 2016-10-05 DIAGNOSIS — I119 Hypertensive heart disease without heart failure: Secondary | ICD-10-CM | POA: Diagnosis not present

## 2016-10-05 HISTORY — DX: Unspecified atrial flutter: I48.92

## 2016-10-05 LAB — ECHOCARDIOGRAM COMPLETE
Height: 68 in
Weight: 2656 oz

## 2016-10-05 MED ORDER — DILTIAZEM HCL ER COATED BEADS 120 MG PO CP24: 120.0000 mg | ORAL_CAPSULE | Freq: Every day | ORAL | 11 refills | Status: DC

## 2016-10-05 NOTE — Progress Notes (Signed)
Cardiology Office Note   Date:  10/05/2016   ID:  Nicole [name], DOB 1940/06/16, MRN [id number]  PCP:  Nicole Lima, MD    No chief complaint on file. atrial flutter   Wt Readings from Last 3 Encounters:  10/05/16 166 lb (75.3 kg)  09/23/16 169 lb (76.7 kg)  09/23/16 169 lb 8 oz (76.9 kg)       History of Present Illness: Nicole [name] is a 76 y.o. female who is being seen today for the evaluation of atrial flutter at the request of Nicole Lima, MD.  She was receiving chemotherapy for lung cancer.  SHe had some low BP (90/62) and her losartan and bystolic were stopped.  The next day, she felt a rapid pulse and it was irregular.  She went to the ER and was found to have atrial flutter.   Eliquis was started.  No significant bleeding problems.   No recurrence of the palpitations that brought her to the emergency room.  Denies : Chest pain. Dizziness. Leg edema. Nitroglycerin use. Orthopnea. Palpitations. Paroxysmal nocturnal dyspnea. Shortness of breath. Syncope.      Past Medical History:  Diagnosis Date  . Adenocarcinoma of right lung, stage 3 ([place]) 07/28/2016  . Bronchitis    hx of  . 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 Diabetes mellitus    type 2  . Gallstones   . Headache   . History of radiation therapy 08/09/2016 to 09/19/2016   Right lung was treated to 60 Gy in 30 fractions at 2 Gy per fraction  . Hypertension   . Low back pain   . Lung mass    with lymphadenopathy  . Osteoarthritis     Past Surgical History:  Procedure Laterality Date  . ABDOMINAL HYSTERECTOMY  2004  . APPENDECTOMY  when 76 years old  . COLONOSCOPY  08-05-09   [name]  . INCONTINENCE SURGERY    . KNEE ARTHROSCOPY Left   . LEFT HEART CATHETERIZATION WITH CORONARY ANGIOGRAM N/A 07/19/2013   Procedure: LEFT HEART CATHETERIZATION WITH CORONARY ANGIOGRAM;  Surgeon: Sinclair Grooms, MD;  Location: [facility] CATH LAB;  Service: Cardiovascular;  Laterality: N/A;  . POLYPECTOMY   08-05-09   2 polyps  . TOTAL KNEE ARTHROPLASTY Left 03/28/2012   Procedure: LEFT TOTAL KNEE ARTHROPLASTY;  Surgeon: Tobi Bastos, MD;  Location: [facility] ORS;  Service: Orthopedics;  Laterality: Left;  . TUBAL LIGATION    . VIDEO BRONCHOSCOPY WITH ENDOBRONCHIAL ULTRASOUND N/A 07/11/2016   Procedure: VIDEO BRONCHOSCOPY WITH ENDOBRONCHIAL ULTRASOUND;  Surgeon: Marshell Garfinkel, MD;  Location: [place];  Service: Pulmonary;  Laterality: N/A;     Current Outpatient Prescriptions  Medication Sig Dispense Refill  . apixaban (ELIQUIS) 5 MG TABS tablet Take 1 tablet (5 mg total) by mouth 2 (two) times daily. 60 tablet 0  . diltiazem (CARDIZEM CD) 120 MG 24 hr capsule Take 1 capsule (120 mg total) by mouth daily. 30 capsule 0  . mometasone-formoterol (DULERA) 100-5 MCG/ACT AERO Inhale 2 puffs into the lungs 2 times daily at 12 noon and 4 pm. 1 Inhaler 1  . Multiple Vitamins-Minerals (MULTIVITAMIN WITH MINERALS) tablet Take 1 tablet by mouth daily.    [name] oxyCODONE-acetaminophen (PERCOCET/ROXICET) 5-325 MG tablet Take 1 tablet by mouth every 4 (four) hours as needed for severe pain. 30 tablet 0  . prochlorperazine (COMPAZINE) 10 MG tablet take 1 tablet by mouth every 6 hours if needed for nausea and vomiting 30  tablet 0  . Turmeric 450 MG CAPS Take 900 mg by mouth daily.     [name] allopurinol (ZYLOPRIM) 100 MG tablet take 1 tablet by mouth once daily (Patient not taking: Reported on 10/05/2016) 90 tablet 1  . fluconazole (DIFLUCAN) 100 MG tablet Take 1 tablet (100 mg total) by mouth daily. (Patient not taking: Reported on 10/05/2016) 10 tablet 0  . FREESTYLE LITE test strip TEST once daily as directed 100 each 11  . lidocaine (XYLOCAINE) 2 % solution Use as directed 10 mLs in the mouth or throat every 3 (three) hours as needed for mouth pain. Swish and swallow or spit (Patient not taking: Reported on 09/23/2016) 200 mL 1  . magic mouthwash SOLN Take 5 mLs by mouth 4 (four) times daily as needed for mouth pain. Use for  throat pain also. (Patient not taking: Reported on 09/23/2016) 250 mL 1  . sucralfate (CARAFATE) 1 g tablet Take 1 tablet (1 g total) by mouth 4 (four) times daily -  with meals and at bedtime. Dissolve tablet in 15 ml of water before taking. (Patient not taking: Reported on 09/23/2016) 120 tablet 0  . Wound Dressings (SONAFINE EX) Apply topically.     No current facility-administered medications for this visit.     Allergies:   No known allergies    Social History:  The patient  reports that she has never smoked. She has never used smokeless tobacco. She reports that she does not drink alcohol or use drugs.   Family History:  The patient's family history includes Arthritis in her other; Cancer in her other; Diabetes in her other; Heart attack in her father; Heart disease in her father; Hyperlipidemia in her other; Hypertension in her other.    ROS:  Please see the history of present illness.   Otherwise, review of systems are positive for palpitations.   All other systems are reviewed and negative.    PHYSICAL EXAM: VS:  BP 118/72   Pulse (!) 105   Ht 5\' 8"  (1.727 m)   Wt 166 lb (75.3 kg)   BMI 25.24 kg/m  , BMI Body mass index is 25.24 kg/m. GEN: Well nourished, well developed, in no acute distress  HEENT: normal  Neck: no JVD, carotid bruits, or masses Cardiac: RRR- occasional premature beats; no murmurs, rubs, or gallops,no edema  Respiratory:  clear to auscultation bilaterally, normal work of breathing GI: soft, nontender, nondistended, + BS MS: no deformity or atrophy  Skin: warm and dry, no rash Neuro:  Strength and sensation are intact Psych: euthymic mood, full affect   EKG:   The ekg ordered today demonstrates sinus tach, no ST changes   Recent Labs: 06/14/2016: TSH 0.81 09/23/2016: ALT 15; BUN 13; Creatinine, Ser 0.94; Hemoglobin 12.5; Magnesium 1.9; Platelets 195; Potassium 3.7; Sodium 134   Lipid Panel    Component Value Date/Time   CHOL 176 06/14/2016 1356     TRIG (H) 06/14/2016 1356    460.0 Triglyceride is over 400; calculations on Lipids are invalid.   HDL 40.70 06/14/2016 1356   CHOLHDL 4 06/14/2016 1356   VLDL 40.6 (H) 04/14/2015 1043   LDLCALC 89 10/31/2011 1154   LDLDIRECT 74.0 06/14/2016 1356     Other studies Reviewed: Additional studies/ records that were reviewed today with results demonstrating: Normal TSH.   ASSESSMENT AND PLAN:  1. Atrial flutter: Continue Diltiazem and Eliquis. Check echo to evaluate for structural heart disease.  She is now back to normal sinus rhythm.  2. HTN: Now controlled with Diltiazem.  This is a good choice given the atrial flutter. 3. Lung cancer: She will be having a follow-up CT scan to see if there has been any progression of disease in the setting of her chemotherapy and radiation.  Increased risk of blood clot formation.  4. Hypertriglyceridemia: Noted in May 2018. Follwed by Dr. Ronnald Ramp. 5. She is planning on going to [place] in November. I think that should be fine. This patients CHA2DS2-VASc Score and unadjusted Ischemic Stroke Rate (% per year) is equal to 3.2 % stroke rate/year from a score of 3  Above score calculated as 1 point each if present [CHF, HTN, DM, Vascular=MI/PAD/Aortic Plaque, Age if 65-74, or Female] Above score calculated as 2 points each if present [Age > 75, or Stroke/TIA/TE]  6.    Current medicines are reviewed at length with the patient today.  The patient concerns regarding her medicines were addressed.  The following changes have been made:  No change  Labs/ tests ordered today include: echo No orders of the defined types were placed in this encounter.   Recommend 150 minutes/week of aerobic exercise Low fat, low carb, high fiber diet recommended  Disposition:   FU in 4 months   Signed, Larae Grooms, MD  10/05/2016 11:28 AM    [facility] [place], [place], [place]  [address] Phone: [phone]; Fax: [phone]

## 2016-10-05 NOTE — Patient Instructions (Addendum)
Medication Instructions:  Your physician recommends that you continue on your current medications as directed. Please refer to the Current Medication list given to you today.   Labwork: None ordered  Testing/Procedures: Your physician has requested that you have an echocardiogram. Echocardiography is a painless test that uses sound waves to create images of your heart. It provides your doctor with information about the size and shape of your heart and how well your heart's chambers and valves are working. This procedure takes approximately one hour. There are no restrictions for this procedure.   Follow-Up: Your physician wants you to follow-up in: 4 months with Dr. Irish Lack.   Any Other Special Instructions Will Be Listed Below (If Applicable).  Echocardiogram An echocardiogram, or echocardiography, uses sound waves (ultrasound) to produce an image of your heart. The echocardiogram is simple, painless, obtained within a short period of time, and offers valuable information to your health care provider. The images from an echocardiogram can provide information such as:  Evidence of coronary artery disease (CAD).  Heart size.  Heart muscle function.  Heart valve function.  Aneurysm detection.  Evidence of a past heart attack.  Fluid buildup around the heart.  Heart muscle thickening.  Assess heart valve function.  Tell a health care provider about:  Any allergies you have.  All medicines you are taking, including vitamins, herbs, eye drops, creams, and over-the-counter medicines.  Any problems you or family members have had with anesthetic medicines.  Any blood disorders you have.  Any surgeries you have had.  Any medical conditions you have.  Whether you are pregnant or may be pregnant. What happens before the procedure? No special preparation is needed. Eat and drink normally. What happens during the procedure?  In order to produce an image of your heart, gel  will be applied to your chest and a wand-like tool (transducer) will be moved over your chest. The gel will help transmit the sound waves from the transducer. The sound waves will harmlessly bounce off your heart to allow the heart images to be captured in real-time motion. These images will then be recorded.  You may need an IV to receive a medicine that improves the quality of the pictures. What happens after the procedure? You may return to your normal schedule including diet, activities, and medicines, unless your health care provider tells you otherwise. This information is not intended to replace advice given to you by your health care provider. Make sure you discuss any questions you have with your health care provider. Document Released: 01/08/2000 Document Revised: 08/29/2015 Document Reviewed: 09/17/2012 Elsevier Interactive Patient Education  2017 [employer].    If you need a refill on your cardiac medications before your next appointment, please call your pharmacy.

## 2016-10-06 ENCOUNTER — Encounter: Payer: Self-pay | Admitting: Internal Medicine

## 2016-10-06 ENCOUNTER — Ambulatory Visit (INDEPENDENT_AMBULATORY_CARE_PROVIDER_SITE_OTHER): Payer: Medicare Other | Admitting: Internal Medicine

## 2016-10-06 VITALS — BP 110/60 | HR 105 | Temp 98.6°F | Resp 16 | Ht 68.0 in | Wt 166.0 lb

## 2016-10-06 DIAGNOSIS — I10 Essential (primary) hypertension: Secondary | ICD-10-CM | POA: Diagnosis not present

## 2016-10-06 DIAGNOSIS — I483 Typical atrial flutter: Secondary | ICD-10-CM | POA: Diagnosis not present

## 2016-10-06 DIAGNOSIS — G893 Neoplasm related pain (acute) (chronic): Secondary | ICD-10-CM | POA: Diagnosis not present

## 2016-10-06 HISTORY — DX: Neoplasm related pain (acute) (chronic): G89.3

## 2016-10-06 MED ORDER — OXYCODONE-ACETAMINOPHEN 5-325 MG PO TABS: 1.0000 | ORAL_TABLET | Freq: Three times a day (TID) | ORAL | 0 refills | Status: DC | PRN

## 2016-10-06 NOTE — Progress Notes (Signed)
Subjective:  Patient ID: Nicole [name], female    DOB: 02/19/1940  Age: 76 y.o. MRN: [id number]  CC: Atrial Fibrillation; Hypertension; and Diabetes   HPI Nicole [name] presents for f/up - She was recently diagnosed with atrial flutter and iha been placed on diltiazem and Eliquis. She has been stable since then with no more episodes of low blood pressure, dizziness, or lightheadedness. She does not complain of palpitations. She has a persistent cough that's productive of clear phlegm. She tells me she is undergoing a CT scan of her chest within the next week. She does not want to use an inhaler or prescription medication to control the cough. She is getting symptom relief with Robitussin-DM. She complains of fatigue, decreased appetite, painful swallowing, and weight loss. She requests a refill on oxycodone.  Outpatient Medications Prior to Visit  Medication Sig Dispense Refill  . allopurinol (ZYLOPRIM) 100 MG tablet take 1 tablet by mouth once daily 90 tablet 1  . apixaban (ELIQUIS) 5 MG TABS tablet Take 1 tablet (5 mg total) by mouth 2 (two) times daily. 60 tablet 0  . diltiazem (CARDIZEM CD) 120 MG 24 hr capsule Take 1 capsule (120 mg total) by mouth daily. 30 capsule 11  . fluconazole (DIFLUCAN) 100 MG tablet Take 1 tablet (100 mg total) by mouth daily. 10 tablet 0  . FREESTYLE LITE test strip TEST once daily as directed 100 each 11  . lidocaine (XYLOCAINE) 2 % solution Use as directed 10 mLs in the mouth or throat every 3 (three) hours as needed for mouth pain. Swish and swallow or spit 200 mL 1  . magic mouthwash SOLN Take 5 mLs by mouth 4 (four) times daily as needed for mouth pain. Use for throat pain also. 250 mL 1  . mometasone-formoterol (DULERA) 100-5 MCG/ACT AERO Inhale 2 puffs into the lungs 2 times daily at 12 noon and 4 pm. 1 Inhaler 1  . Multiple Vitamins-Minerals (MULTIVITAMIN WITH MINERALS) tablet Take 1 tablet by mouth daily.    . sucralfate (CARAFATE) 1 g tablet Take 1  tablet (1 g total) by mouth 4 (four) times daily -  with meals and at bedtime. Dissolve tablet in 15 ml of water before taking. 120 tablet 0  . Turmeric 450 MG CAPS Take 900 mg by mouth daily.     . Wound Dressings (SONAFINE EX) Apply topically.    [name] oxyCODONE-acetaminophen (PERCOCET/ROXICET) 5-325 MG tablet Take 1 tablet by mouth every 4 (four) hours as needed for severe pain. 30 tablet 0  . prochlorperazine (COMPAZINE) 10 MG tablet take 1 tablet by mouth every 6 hours if needed for nausea and vomiting 30 tablet 0   No facility-administered medications prior to visit.     ROS Review of Systems  Constitutional: Positive for appetite change and fatigue. Negative for activity change, chills, diaphoresis, fever and unexpected weight change.  HENT: Positive for trouble swallowing. Negative for facial swelling, sore throat and voice change.   Eyes: Negative.  Negative for visual disturbance.  Respiratory: Positive for cough. Negative for chest tightness, shortness of breath, wheezing and stridor.   Cardiovascular: Positive for chest pain. Negative for palpitations and leg swelling.  Gastrointestinal: Positive for constipation. Negative for abdominal pain, diarrhea, nausea and vomiting.  Endocrine: Negative.   Genitourinary: Negative.  Negative for difficulty urinating, dysuria and hematuria.  Musculoskeletal: Negative.  Negative for back pain, myalgias and neck pain.  Skin: Negative.  Negative for color change and rash.  Allergic/Immunologic: Negative.  Neurological: Negative.  Negative for dizziness, syncope, facial asymmetry, weakness, light-headedness and numbness.  Hematological: Negative for adenopathy. Does not bruise/bleed easily.  Psychiatric/Behavioral: Negative.     Objective:  BP 110/60 (BP Location: Left Arm, Patient Position: Sitting, Cuff Size: Normal)   Pulse (!) 105   Temp 98.6 F (37 C) (Oral)   Resp 16   Ht 5\' 8"  (1.727 m)   Wt 166 lb (75.3 kg)   SpO2 96%   BMI 25.24  kg/m   BP Readings from Last 3 Encounters:  10/06/16 110/60  10/05/16 118/72  09/23/16 116/64    Wt Readings from Last 3 Encounters:  10/06/16 166 lb (75.3 kg)  10/05/16 166 lb (75.3 kg)  09/23/16 169 lb (76.7 kg)    Physical Exam  Constitutional: She is oriented to person, place, and time. No distress.  HENT:  Mouth/Throat: Oropharynx is clear and moist. No oropharyngeal exudate.  Eyes: Conjunctivae are normal. Right eye exhibits no discharge. Left eye exhibits no discharge. No scleral icterus.  Neck: Normal range of motion. Neck supple. No JVD present. No thyromegaly present.  Cardiovascular: Regular rhythm and intact distal pulses.  Tachycardia present.  Exam reveals no gallop and no friction rub.   No murmur heard. Pulmonary/Chest: Effort normal and breath sounds normal. No respiratory distress. She has no wheezes. She has no rales. She exhibits no tenderness.  Abdominal: Soft. Bowel sounds are normal. She exhibits no distension and no mass. There is no tenderness. There is no rebound and no guarding.  Musculoskeletal: Normal range of motion. She exhibits no edema, tenderness or deformity.  Lymphadenopathy:    She has no cervical adenopathy.  Neurological: She is alert and oriented to person, place, and time.  Skin: Skin is warm and dry. No rash noted. She is not diaphoretic. No erythema. No pallor.  Vitals reviewed.   Lab Results  Component Value Date   WBC 2.0 (L) 09/23/2016   HGB 12.5 09/23/2016   HCT 36.2 09/23/2016   PLT 195 09/23/2016   GLUCOSE 130 (H) 09/23/2016   CHOL 176 06/14/2016   TRIG (H) 06/14/2016    460.0 Triglyceride is over 400; calculations on Lipids are invalid.   HDL 40.70 06/14/2016   LDLDIRECT 74.0 06/14/2016   LDLCALC 89 10/31/2011   ALT 15 09/23/2016   AST 16 09/23/2016   NA 134 (L) 09/23/2016   K 3.7 09/23/2016   CL 102 09/23/2016   CREATININE 0.94 09/23/2016   BUN 13 09/23/2016   CO2 22 09/23/2016   TSH 0.81 06/14/2016   INR 1.0  07/06/2016   HGBA1C 7.4 09/20/2016   MICROALBUR 4.0 (H) 06/14/2016    No results found.  Assessment & Plan:   Nicole [name] was seen today for atrial fibrillation, hypertension and diabetes.  Diagnoses and all orders for this visit:  Typical atrial flutter ([place])- she has good rate and rhythm control today. Will continue Cardizem and Eliquis. Will check her TFTs today to be certain that she doesn't have hyperthyroidism as a secondary cause. -     Thyroid Panel With TSH; Future  Essential hypertension- her blood pressure is adequately well-controlled. Will monitor her electrolytes and renal function. -     Basic metabolic panel; Future  Cancer-related pain -     oxyCODONE-acetaminophen (PERCOCET/ROXICET) 5-325 MG tablet; Take 1 tablet by mouth every 8 (eight) hours as needed for severe pain.   I have changed Nicole [name]'s oxyCODONE-acetaminophen. I am also having her maintain her Turmeric, FREESTYLE LITE, mometasone-formoterol, multivitamin  with minerals, allopurinol, Wound Dressings (SONAFINE EX), sucralfate, magic mouthwash, lidocaine, fluconazole, apixaban, and diltiazem.  Meds ordered this encounter  Medications  . oxyCODONE-acetaminophen (PERCOCET/ROXICET) 5-325 MG tablet    Sig: Take 1 tablet by mouth every 8 (eight) hours as needed for severe pain.    Dispense:  60 tablet    Refill:  0     Follow-up: Return in about 4 months (around 02/05/2017).  Scarlette Calico, MD

## 2016-10-06 NOTE — Patient Instructions (Signed)

## 2016-10-07 ENCOUNTER — Other Ambulatory Visit: Payer: Self-pay | Admitting: Internal Medicine

## 2016-10-10 ENCOUNTER — Encounter (HOSPITAL_COMMUNITY): Payer: Self-pay

## 2016-10-10 ENCOUNTER — Ambulatory Visit (HOSPITAL_COMMUNITY)
Admission: RE | Admit: 2016-10-10 | Discharge: 2016-10-10 | Disposition: A | Discharge status: Home / Self Care | Payer: Medicare Other | Source: Ambulatory Visit | Attending: Internal Medicine | Admitting: Internal Medicine

## 2016-10-10 DIAGNOSIS — C3491 Malignant neoplasm of unspecified part of right bronchus or lung: Secondary | ICD-10-CM

## 2016-10-10 DIAGNOSIS — R590 Localized enlarged lymph nodes: Secondary | ICD-10-CM | POA: Diagnosis not present

## 2016-10-10 DIAGNOSIS — Z5111 Encounter for antineoplastic chemotherapy: Secondary | ICD-10-CM

## 2016-10-10 DIAGNOSIS — C3490 Malignant neoplasm of unspecified part of unspecified bronchus or lung: Secondary | ICD-10-CM | POA: Diagnosis not present

## 2016-10-10 MED ORDER — IOPAMIDOL (ISOVUE-300) INJECTION 61%
75.0000 mL | Freq: Once | INTRAVENOUS | Status: AC | PRN
  Administered 2016-10-10: 75 mL via INTRAVENOUS

## 2016-10-10 MED ORDER — IOPAMIDOL (ISOVUE-300) INJECTION 61%
INTRAVENOUS | Status: AC
  Administered 2016-10-10: 75 mL via INTRAVENOUS
  Filled 2016-10-10: qty 75

## 2016-10-12 ENCOUNTER — Telehealth: Payer: Self-pay | Admitting: Oncology

## 2016-10-12 ENCOUNTER — Encounter: Payer: Self-pay | Admitting: Internal Medicine

## 2016-10-12 ENCOUNTER — Encounter: Payer: Self-pay | Admitting: Radiation Oncology

## 2016-10-12 ENCOUNTER — Other Ambulatory Visit (HOSPITAL_BASED_OUTPATIENT_CLINIC_OR_DEPARTMENT_OTHER): Payer: Medicare Other

## 2016-10-12 ENCOUNTER — Ambulatory Visit
Admission: RE | Admit: 2016-10-12 | Discharge: 2016-10-12 | Disposition: A | Discharge status: Home / Self Care | Payer: Medicare Other | Source: Ambulatory Visit | Attending: Radiation Oncology | Admitting: Radiation Oncology

## 2016-10-12 ENCOUNTER — Encounter: Payer: Self-pay | Admitting: Oncology

## 2016-10-12 ENCOUNTER — Telehealth: Payer: Self-pay | Admitting: Internal Medicine

## 2016-10-12 ENCOUNTER — Ambulatory Visit (HOSPITAL_BASED_OUTPATIENT_CLINIC_OR_DEPARTMENT_OTHER): Payer: Medicare Other | Admitting: Internal Medicine

## 2016-10-12 VITALS — BP 118/83 | HR 94 | Temp 98.7°F | Resp 17 | Wt 163.7 lb

## 2016-10-12 VITALS — BP 117/72 | HR 96 | Temp 98.5°F | Ht 68.0 in | Wt 163.8 lb

## 2016-10-12 DIAGNOSIS — C3491 Malignant neoplasm of unspecified part of right bronchus or lung: Secondary | ICD-10-CM

## 2016-10-12 DIAGNOSIS — C3431 Malignant neoplasm of lower lobe, right bronchus or lung: Secondary | ICD-10-CM | POA: Diagnosis not present

## 2016-10-12 DIAGNOSIS — Z7189 Other specified counseling: Secondary | ICD-10-CM

## 2016-10-12 DIAGNOSIS — Z5111 Encounter for antineoplastic chemotherapy: Secondary | ICD-10-CM

## 2016-10-12 DIAGNOSIS — R05 Cough: Secondary | ICD-10-CM

## 2016-10-12 DIAGNOSIS — Z5112 Encounter for antineoplastic immunotherapy: Secondary | ICD-10-CM | POA: Insufficient documentation

## 2016-10-12 DIAGNOSIS — R5382 Chronic fatigue, unspecified: Secondary | ICD-10-CM

## 2016-10-12 LAB — CBC WITH DIFFERENTIAL/PLATELET
BASO%: 0.2 % (ref 0.0–2.0)
Basophils Absolute: 0 10*3/uL (ref 0.0–0.1)
EOS%: 0.8 % (ref 0.0–7.0)
Eosinophils Absolute: 0.1 10*3/uL (ref 0.0–0.5)
HCT: 36.8 % (ref 34.8–46.6)
HGB: 12.3 g/dL (ref 11.6–15.9)
LYMPH%: 15.1 % (ref 14.0–49.7)
MCH: 31.9 pg (ref 25.1–34.0)
MCHC: 33.4 g/dL (ref 31.5–36.0)
MCV: 95.3 fL (ref 79.5–101.0)
MONO#: 0.6 10*3/uL (ref 0.1–0.9)
MONO%: 9.2 % (ref 0.0–14.0)
NEUT#: 4.9 10*3/uL (ref 1.5–6.5)
NEUT%: 74.7 % (ref 38.4–76.8)
Platelets: 250 10*3/uL (ref 145–400)
RBC: 3.86 10*6/uL (ref 3.70–5.45)
RDW: 16.3 % — ABNORMAL HIGH (ref 11.2–14.5)
WBC: 6.5 10*3/uL (ref 3.9–10.3)
lymph#: 1 10*3/uL (ref 0.9–3.3)

## 2016-10-12 LAB — COMPREHENSIVE METABOLIC PANEL
ALT: 12 U/L (ref 0–55)
AST: 20 U/L (ref 5–34)
Albumin: 3.2 g/dL — ABNORMAL LOW (ref 3.5–5.0)
Alkaline Phosphatase: 59 U/L (ref 40–150)
Anion Gap: 9 mEq/L (ref 3–11)
BUN: 15.8 mg/dL (ref 7.0–26.0)
CO2: 25 mEq/L (ref 22–29)
Calcium: 9.5 mg/dL (ref 8.4–10.4)
Chloride: 103 mEq/L (ref 98–109)
Creatinine: 1.1 mg/dL (ref 0.6–1.1)
EGFR: 48 mL/min/{1.73_m2} — ABNORMAL LOW (ref >90)
Glucose: 115 mg/dl (ref 70–140)
Potassium: 4 mEq/L (ref 3.5–5.1)
Sodium: 137 mEq/L (ref 136–145)
Total Bilirubin: 0.63 mg/dL (ref 0.20–1.20)
Total Protein: 7.5 g/dL (ref 6.4–8.3)

## 2016-10-12 MED ORDER — SUCRALFATE 1 G PO TABS: 1.0000 g | ORAL_TABLET | Freq: Three times a day (TID) | ORAL | 1 refills | Status: DC

## 2016-10-12 NOTE — Progress Notes (Signed)
[place] [phone]   [phone]  OFFICE PROGRESS NOTE  [name], MD [address] [place] [address]  DIAGNOSIS: Stage IIIa (T1b, N2, M0) non-small cell lung cancer, adenocarcinoma presented with right lower lobe lung mass in addition to right hilar and subcarinal lymphadenopathy diagnosed in June 2018.  Biomarker Findings Tumor Mutational Burden - TMB-Intermediate (8 Muts/Mb) Microsatellite Status - Cannot Be Determined Genomic Findings For a complete list of the genes assayed, please refer to the Appendix. EGFR E709K, G719S [id number] N829F AOZ3Y8 splice site 657-8I>O 7 Disease relevant genes with no reportable alterations: KRAS, ALK, BRAF, MET, ERBB2, RET, ROS1  PDL1 Expression 70%.  PRIOR THERAPY:Concurrent chemoradiation with weekly carboplatin for AUC of 2 and paclitaxel 45 MG/M2. Status post 7 cycles with partial response.  CURRENT THERAPY: Consolidation treatment with immunotherapy with Imfinzi (Durvalumab) 10 MG/KG every 2 weeks, first dose 10/19/2016.  INTERVAL HISTORY: [name] Nicole 76 y.o. female returns to the clinic today for follow-up visit accompanied by 2 family members. The patient is feeling fine today and recovering well from the previous course of concurrent chemoradiation except for mild dry cough as well as mild odynophagia. She is much better but she lost 3 pounds since her last visit. She denied having any current chest pain, shortness of breath, cough or hemoptysis. She denied having any fever or chills. She has no nausea, vomiting, diarrhea or constipation. She had repeat CT scan of the chest performed recently and she is here for evaluation and discussion of her scan results.  MEDICAL HISTORY: Past Medical History:  Diagnosis Date  . Adenocarcinoma of right lung, stage 3 ([place]) 07/28/2016  . Bronchitis    hx of  . 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 Diabetes mellitus    type 2  .  Gallstones   . Headache   . History of radiation therapy 08/09/2016 to 09/19/2016   Right lung was treated to 60 Gy in 30 fractions at 2 Gy per fraction  . Hypertension   . Low back pain   . Lung mass    with lymphadenopathy  . Osteoarthritis     ALLERGIES:  is allergic to no known allergies.  MEDICATIONS:  Current Outpatient Prescriptions  Medication Sig Dispense Refill  . apixaban (ELIQUIS) 5 MG TABS tablet Take 1 tablet (5 mg total) by mouth 2 (two) times daily. 60 tablet 0  . diltiazem (CARDIZEM CD) 120 MG 24 hr capsule Take 1 capsule (120 mg total) by mouth daily. 30 capsule 11  . fluconazole (DIFLUCAN) 100 MG tablet Take 1 tablet (100 mg total) by mouth daily. 10 tablet 0  . FREESTYLE LITE test strip TEST once daily as directed 100 each 11  . mometasone-formoterol (DULERA) 100-5 MCG/ACT AERO Inhale 2 puffs into the lungs 2 times daily at 12 noon and 4 pm. 1 Inhaler 1  . Multiple Vitamins-Minerals (MULTIVITAMIN WITH MINERALS) tablet Take 1 tablet by mouth daily.    [name] oxyCODONE-acetaminophen (PERCOCET/ROXICET) 5-325 MG tablet Take 1 tablet by mouth every 8 (eight) hours as needed for severe pain. 60 tablet 0  . prochlorperazine (COMPAZINE) 10 MG tablet take 1 tablet by mouth every 6 hours if needed for nausea and vomiting 30 tablet 0  . Turmeric 450 MG CAPS Take 900 mg by mouth daily.     . Wound Dressings (SONAFINE EX) Apply topically.    . magic mouthwash SOLN Take 5 mLs by mouth 4 (  four) times daily as needed for mouth pain. Use for throat pain also. (Patient not taking: Reported on 10/12/2016) 250 mL 1   No current facility-administered medications for this visit.     SURGICAL HISTORY:  Past Surgical History:  Procedure Laterality Date  . ABDOMINAL HYSTERECTOMY  2004  . APPENDECTOMY  when 76 years old  . COLONOSCOPY  08-05-09   Nicole [name]  . INCONTINENCE SURGERY    . KNEE ARTHROSCOPY Left   . LEFT HEART CATHETERIZATION WITH CORONARY ANGIOGRAM N/A 07/19/2013   Procedure:  LEFT HEART CATHETERIZATION WITH CORONARY ANGIOGRAM;  Surgeon: Sinclair Grooms, MD;  Location: [facility] CATH LAB;  Service: Cardiovascular;  Laterality: N/A;  . POLYPECTOMY  08-05-09   2 polyps  . TOTAL KNEE ARTHROPLASTY Left 03/28/2012   Procedure: LEFT TOTAL KNEE ARTHROPLASTY;  Surgeon: Tobi Bastos, MD;  Location: [facility] ORS;  Service: Orthopedics;  Laterality: Left;  . TUBAL LIGATION    . VIDEO BRONCHOSCOPY WITH ENDOBRONCHIAL ULTRASOUND N/A 07/11/2016   Procedure: VIDEO BRONCHOSCOPY WITH ENDOBRONCHIAL ULTRASOUND;  Surgeon: Marshell Garfinkel, MD;  Location: [facility];  Service: Pulmonary;  Laterality: N/A;    REVIEW OF SYSTEMS:  Constitutional: positive for fatigue Eyes: negative Ears, nose, mouth, throat, and face: negative Respiratory: positive for cough Cardiovascular: negative Gastrointestinal: positive for odynophagia Genitourinary:negative Integument/breast: negative Hematologic/lymphatic: negative Musculoskeletal:negative Neurological: negative Behavioral/Psych: negative Endocrine: negative Allergic/Immunologic: negative   PHYSICAL EXAMINATION: General appearance: alert, cooperative and no distress Head: Normocephalic, without obvious abnormality, atraumatic Neck: no adenopathy, no JVD, supple, symmetrical, trachea midline and thyroid not enlarged, symmetric, no tenderness/mass/nodules Lymph nodes: Cervical, supraclavicular, and axillary nodes normal. Resp: clear to auscultation bilaterally Back: symmetric, no curvature. ROM normal. No CVA tenderness. Cardio: regular rate and rhythm, S1, S2 normal, no murmur, click, rub or gallop GI: soft, non-tender; bowel sounds normal; no masses,  no organomegaly Extremities: extremities normal, atraumatic, no cyanosis or edema Neurologic: Alert and oriented X 3, normal strength and tone. Normal symmetric reflexes. Normal coordination and gait  ECOG PERFORMANCE STATUS: 1 - Symptomatic but completely ambulatory  Blood pressure 118/83, pulse 94,  temperature 98.7 F (37.1 C), temperature source Oral, resp. rate 17, weight 163 lb 11.2 oz (74.3 kg), SpO2 97 %.  LABORATORY DATA: Lab Results  Component Value Date   WBC 6.5 10/12/2016   HGB 12.3 10/12/2016   HCT 36.8 10/12/2016   MCV 95.3 10/12/2016   PLT 250 10/12/2016      Chemistry      Component Value Date/Time   NA 137 10/12/2016 1054   K 4.0 10/12/2016 1054   CL 102 09/23/2016 1322   CO2 25 10/12/2016 1054   BUN 15.8 10/12/2016 1054   CREATININE 1.1 10/12/2016 1054      Component Value Date/Time   CALCIUM 9.5 10/12/2016 1054   ALKPHOS 59 10/12/2016 1054   AST 20 10/12/2016 1054   ALT 12 10/12/2016 1054   BILITOT 0.63 10/12/2016 1054       RADIOGRAPHIC STUDIES: Dg Chest 2 View  Result Date: 09/22/2016 CLINICAL DATA:  Cough and shortness of breath. EXAM: CHEST  2 VIEW COMPARISON:  July 11, 2016 FINDINGS: The heart, hila, and mediastinum are unchanged unremarkable. Mild atelectasis in the bases. No nodules or masses. No focal infiltrates. No acute abnormalities. IMPRESSION: No active cardiopulmonary disease. Electronically Signed   By: Dorise Bullion III M.D   On: 09/22/2016 20:56   Ct Chest W Contrast  Result Date: 10/10/2016 CLINICAL DATA:  Lung cancer.  Cough. EXAM: CT  CHEST WITH CONTRAST TECHNIQUE: Multidetector CT imaging of the chest was performed during intravenous contrast administration. CONTRAST:  75 cc Isovue 300 COMPARISON:  06/28/2016 FINDINGS: Cardiovascular: The heart size is normal. No pericardial effusion. Coronary artery calcification is evident. Atherosclerotic calcification is noted in the wall of the thoracic aorta. Mediastinum/Nodes: High right paraesophageal lymph node towards the thoracic inlet has progressed in the interval measuring 8 mm short axis today (image 21 series 2) compared to 3 mm previously. Subcarinal lymph node measured 16 mm short axis on the prior study is 13 mm short axis today. Circumferential wall thickening noted mid  esophagus (see image 67 series 2). No hilar lymphadenopathy No mediastinal lymphadenopathy. Lungs/Pleura: Posterior right lower lobe lesion smaller than prior measuring 2.4 x 1.3 cm today compared to 3.2 x 1.9 cm previously. 3 mm anterior left upper lobe pulmonary nodule is stable. No focal airspace consolidation. No pulmonary edema or pleural effusion. Upper Abdomen: The liver shows diffusely decreased attenuation suggesting steatosis. Laminar flow of un opacified blood noted in the portal vein. No adrenal nodule or mass. Musculoskeletal: Bone windows reveal no worrisome lytic or sclerotic osseous lesions. IMPRESSION: 1. High right paraesophageal lymph node has increased slightly in size in interval. Close attention on follow-up recommended. 2. Interval decrease in some parietal lymphadenopathy and associated interval decrease in size of the posterior right lower lobe lesion. 3. Interval development circumferential wall thickening midesophagus. Esophagitis would be a consideration. 4. Coronary artery and Aortic Atherosclerois (ICD10-170.0) Electronically Signed   By: Misty Stanley M.D.   On: 10/10/2016 13:53    ASSESSMENT AND PLAN: This is a very pleasant 76 years old Asian female recently diagnosed with a stage IIIa non-small cell lung cancer, adenocarcinoma. She underwent a course of concurrent chemoradiation with weekly carboplatin and paclitaxel status post 7 cycles. The patient tolerated the previous course fairly well except for mild odynophagia and dry cough. She had repeat CT scan of the chest performed recently. I personally and independently reviewed the scan images and discuss the results with the patient and her family. Her scan showed improvement of her disease and the right lower lobe lung lesion as well as mediastinal lymphadenopathy but there was mild increase in high right paraesophageal lymph node which is currently nonspecific and not pathologic by size criteria but still suspicious and will  need close follow-up on upcoming scan. I discussed with the patient options for management of her condition at this point including close observation and monitoring versus consolidation treatment with immunotherapy with Imfinzi (Durvalumab) 10 MG/KG every 2 weeks for a total of one year unless the patient developed toxicity or disease progression. The patient is interested in the treatment with consolidation Imfinzi (Durvalumab). I discussed with her the adverse effect of this treatment including but not limited to immunotherapy mediated skin rash, diarrhea, or inflammation of the lung, kidney, liver, thyroid or other endocrine dysfunction including type 1 diabetes mellitus. The patient would like to proceed with treatment as planned and she is expected to start the first cycle of this treatment on 10/19/2016. She will come back for follow-up visit in 3 weeks for evaluation before starting cycle #2. The patient was advised to call immediately if she has any concerning symptoms in the interval. The patient voices understanding of current disease status and treatment options and is in agreement with the current care plan. All questions were answered. The patient knows to call the clinic with any problems, questions or concerns. We can certainly see the patient much  sooner if necessary.  Disclaimer: This note was dictated with voice recognition software. Similar sounding words can inadvertently be transcribed and may not be corrected upon review.

## 2016-10-12 NOTE — Telephone Encounter (Signed)
Gave patient avs and calendar with appts per 9/19 los

## 2016-10-12 NOTE — Progress Notes (Signed)
Nicole [name] is here for follow up.  She continues to have burning with swallowing and said it is getting better slowly.  She is taking oxycodone/acetaminophen 3-4 times a day which helps.  She reports having a frequent productive cough with clear sputum.  She said the cough is getting better.  She takes Robitussin DM as needed.  She reports having occasional nausea with the coughing and takes Compazine PRN.  She also mentioned that she tastes food when coughing up sputum.  She reports having a poor appetite and energy level.  She reports shortness of breath with activity.  She also denies having any skin irritation.    BP 117/72 (BP Location: Left Arm, Patient Position: Sitting)   Pulse 96   Temp 98.5 F (36.9 C) (Oral)   Ht 5\' 8"  (1.727 m)   Wt 163 lb 12.8 oz (74.3 kg)   SpO2 98%   BMI 24.91 kg/m    Wt Readings from Last 3 Encounters:  10/12/16 163 lb 12.8 oz (74.3 kg)  10/12/16 163 lb 11.2 oz (74.3 kg)  10/06/16 166 lb (75.3 kg)

## 2016-10-12 NOTE — Progress Notes (Signed)
Radiation Oncology         [phone] ________________________________  Name: Nicole [name] MRN: [id number]  Date: 10/12/2016  DOB: 1940/12/01  Follow-Up Visit Note  CC: [name], MD  [name], *    ICD-10-CM   1. Adenocarcinoma of right lung, stage 3 ([facility]) C34.91     Diagnosis:   Stage III-A adenocarcinoma of lung with primary site presenting in the right lower lung area     Interval Since Last Radiation:  08/09/2016-09/19/2016, right lung treated to 60 Gy in 30 fractions at 2 Gy per fraction  Narrative:  The patient returns today for routine follow-up. Of note since the patient last treatment, she underwent a CXR on 09/22/2016 with results revealing: IMPRESSION: No active cardiopulmonary disease. She also underwent a CT Chest on 10/10/2016 with results revealing: IMPRESSION: 1. High right paraesophageal lymph node has increased slightly in size in interval. Close attention on follow-up recommended. 2. Interval decrease in the mediastinal lymphadenopathy and associated interval decrease in size of the posterior right lower lobe lesion. 3. Interval development circumferential wall thickening midesophagus. Esophagitis would be a consideration. 4. Coronary artery and Aortic Atherosclerosis.  Pt presents to the office today accompanied by her daughter and family member. She reports that she is still unable to eat solid foods and that she mainly eats soup. Pt notes that with mashed potatoes she has to make them a more liquid consistency in order to eat. Pt denies using Carafate and notes that she doesn't need a refill of her pain medications. She notes that at the 6th week of her chemo treatment she began to have side effects from treatment. Pt reports that she recently saw Dr. Julien Nordmann and she was instructed that she would begin immunotherapy in the near future.   On review of systems, pt reports productive cough (intermittently treated with Robitussin DM and  oxycodone/acetaminophen). She reports decreased energy levels and decreased appetite. She also reports trouble swallowing solid or soft foods. She denies radiation related skin changes.    ALLERGIES:  is allergic to no known allergies.  Meds: Current Outpatient Prescriptions  Medication Sig Dispense Refill  . apixaban (ELIQUIS) 5 MG TABS tablet Take 1 tablet (5 mg total) by mouth 2 (two) times daily. 60 tablet 0  . diltiazem (CARDIZEM CD) 120 MG 24 hr capsule Take 1 capsule (120 mg total) by mouth daily. 30 capsule 11  . FREESTYLE LITE test strip TEST once daily as directed 100 each 11  . mometasone-formoterol (DULERA) 100-5 MCG/ACT AERO Inhale 2 puffs into the lungs 2 times daily at 12 noon and 4 pm. 1 Inhaler 1  . Multiple Vitamins-Minerals (MULTIVITAMIN WITH MINERALS) tablet Take 1 tablet by mouth daily.    [name] oxyCODONE-acetaminophen (PERCOCET/ROXICET) 5-325 MG tablet Take 1 tablet by mouth every 8 (eight) hours as needed for severe pain. 60 tablet 0  . prochlorperazine (COMPAZINE) 10 MG tablet take 1 tablet by mouth every 6 hours if needed for nausea and vomiting 30 tablet 0  . Turmeric 450 MG CAPS Take 900 mg by mouth daily.     . fluconazole (DIFLUCAN) 100 MG tablet Take 1 tablet (100 mg total) by mouth daily. (Patient not taking: Reported on 10/12/2016) 10 tablet 0  . magic mouthwash SOLN Take 5 mLs by mouth 4 (four) times daily as needed for mouth pain. Use for throat pain also. (Patient not taking: Reported on 10/12/2016) 250 mL 1  . sucralfate (CARAFATE) 1 g tablet Take 1 tablet (  1 g total) by mouth 4 (four) times daily -  with meals and at bedtime. Dissolve tablet in 10-15 ml of water before taking. 120 tablet 1   No current facility-administered medications for this encounter.    REVIEW OF SYSTEMS: A 10+ POINT REVIEW OF SYSTEMS WAS OBTAINED including neurology, dermatology, psychiatry, cardiac, respiratory, lymph, extremities, GI, GU, musculoskeletal, constitutional, reproductive,  HEENT. All pertinent positives are noted in the HPI. All others are negative.  Physical Findings: The patient is in no acute distress. Patient is alert and oriented.  height is 5\' 8"  (1.727 m) and weight is 163 lb 12.8 oz (74.3 kg). Her oral temperature is 98.5 F (36.9 C). Her blood pressure is 117/72 and her pulse is 96. Her oxygen saturation is 98%. .  No significant changes. Lungs are clear to auscultation bilaterally. Heart has regular rate and rhythm. No palpable cervical, supraclavicular, or axillary adenopathy. Abdomen soft, non-tender, normal bowel sounds.   Lab Findings: Lab Results  Component Value Date   WBC 6.5 10/12/2016   HGB 12.3 10/12/2016   HCT 36.8 10/12/2016   MCV 95.3 10/12/2016   PLT 250 10/12/2016    Radiographic Findings: Dg Chest 2 View  Result Date: 09/22/2016 CLINICAL DATA:  Cough and shortness of breath. EXAM: CHEST  2 VIEW COMPARISON:  July 11, 2016 FINDINGS: The heart, hila, and mediastinum are unchanged unremarkable. Mild atelectasis in the bases. No nodules or masses. No focal infiltrates. No acute abnormalities. IMPRESSION: No active cardiopulmonary disease. Electronically Signed   By: Dorise Bullion III M.D   On: 09/22/2016 20:56   Ct Chest W Contrast  Result Date: 10/10/2016 CLINICAL DATA:  Lung cancer.  Cough. EXAM: CT CHEST WITH CONTRAST TECHNIQUE: Multidetector CT imaging of the chest was performed during intravenous contrast administration. CONTRAST:  75 cc Isovue 300 COMPARISON:  06/28/2016 FINDINGS: Cardiovascular: The heart size is normal. No pericardial effusion. Coronary artery calcification is evident. Atherosclerotic calcification is noted in the wall of the thoracic aorta. Mediastinum/Nodes: High right paraesophageal lymph node towards the thoracic inlet has progressed in the interval measuring 8 mm short axis today (image 21 series 2) compared to 3 mm previously. Subcarinal lymph node measured 16 mm short axis on the prior study is 13 mm short  axis today. Circumferential wall thickening noted mid esophagus (see image 67 series 2). No hilar lymphadenopathy No mediastinal lymphadenopathy. Lungs/Pleura: Posterior right lower lobe lesion smaller than prior measuring 2.4 x 1.3 cm today compared to 3.2 x 1.9 cm previously. 3 mm anterior left upper lobe pulmonary nodule is stable. No focal airspace consolidation. No pulmonary edema or pleural effusion. Upper Abdomen: The liver shows diffusely decreased attenuation suggesting steatosis. Laminar flow of un opacified blood noted in the portal vein. No adrenal nodule or mass. Musculoskeletal: Bone windows reveal no worrisome lytic or sclerotic osseous lesions. IMPRESSION: 1. High right paraesophageal lymph node has increased slightly in size in interval. Close attention on follow-up recommended. 2. Interval decrease in some parietal lymphadenopathy and associated interval decrease in size of the posterior right lower lobe lesion. 3. Interval development circumferential wall thickening midesophagus. Esophagitis would be a consideration. 4. Coronary artery and Aortic Atherosclerois (ICD10-170.0) Electronically Signed   By: Misty Stanley M.D.   On: 10/10/2016 13:53    Impression:  The patient is recovering from the effects of radiation. Patient received partial response to her chest radiation and radiosensitizing chemotherapy as evidence on recent chest CT scan. She will proceed with immunotherapy in approximately 2-3 weeks  under the direction of Dr. Julien Nordmann. I recommend the patient continue use of Carafate in light of her esophagitis and I have refilled her prescription.   Plan:  Routine follow up in 3 months.   ____________________________________ -----------------------------------  Blair Promise, PhD, MD  This document serves as a record of services personally performed by Gery Pray, MD. It was created on her behalf by Steva Colder, a trained medical scribe. The creation of this record is based on  the scribe's personal observations and the provider's statements to them. This document has been checked and approved by the attending provider.

## 2016-10-12 NOTE — Progress Notes (Signed)
DISCONTINUE ON PATHWAY REGIMEN - Non-Small Cell Lung     Administer weekly:     Paclitaxel      Carboplatin   **Always confirm dose/schedule in your pharmacy ordering system**    REASON: Continuation Of Treatment PRIOR TREATMENT: [id number]: Carboplatin AUC=2 + Paclitaxel 45 mg/m2 Weekly During Radiation TREATMENT RESPONSE: Partial Response (PR)  START ON PATHWAY REGIMEN - Non-Small Cell Lung     A cycle is every 14 days:     Durvalumab   **Always confirm dose/schedule in your pharmacy ordering system**    Patient Characteristics: Stage III - Unresectable, PS = 0, 1 AJCC T Category: T1b Current Disease Status: No Distant Mets or Local Recurrence AJCC N Category: N2 AJCC M Category: M0 AJCC 8 Stage Grouping: IIIA Performance Status: PS = 0, 1 Intent of Therapy: Curative Intent, Discussed with Patient

## 2016-10-13 ENCOUNTER — Telehealth: Payer: Self-pay | Admitting: Internal Medicine

## 2016-10-13 NOTE — Telephone Encounter (Signed)
Spoke with patient regarding her appointments on 9/27. Mornings are hard for her because of her husband - so for future reference we need to schedule her in the afternoon. Patient may not make it until right at 8:30.

## 2016-10-14 ENCOUNTER — Other Ambulatory Visit (HOSPITAL_COMMUNITY): Payer: Medicare Other

## 2016-10-18 ENCOUNTER — Ambulatory Visit (HOSPITAL_BASED_OUTPATIENT_CLINIC_OR_DEPARTMENT_OTHER): Payer: Medicare Other | Admitting: Medical

## 2016-10-18 ENCOUNTER — Telehealth: Payer: Self-pay

## 2016-10-18 ENCOUNTER — Telehealth: Payer: Self-pay | Admitting: Internal Medicine

## 2016-10-18 ENCOUNTER — Other Ambulatory Visit (HOSPITAL_BASED_OUTPATIENT_CLINIC_OR_DEPARTMENT_OTHER): Payer: Medicare Other

## 2016-10-18 VITALS — BP 141/77 | HR 100 | Temp 98.5°F | Resp 18 | Ht 68.0 in | Wt 161.9 lb

## 2016-10-18 DIAGNOSIS — I951 Orthostatic hypotension: Secondary | ICD-10-CM | POA: Diagnosis not present

## 2016-10-18 DIAGNOSIS — R112 Nausea with vomiting, unspecified: Secondary | ICD-10-CM | POA: Diagnosis not present

## 2016-10-18 DIAGNOSIS — K208 Other esophagitis without bleeding: Secondary | ICD-10-CM

## 2016-10-18 DIAGNOSIS — E860 Dehydration: Secondary | ICD-10-CM | POA: Diagnosis not present

## 2016-10-18 DIAGNOSIS — C3491 Malignant neoplasm of unspecified part of right bronchus or lung: Secondary | ICD-10-CM

## 2016-10-18 DIAGNOSIS — R5382 Chronic fatigue, unspecified: Secondary | ICD-10-CM

## 2016-10-18 DIAGNOSIS — C3431 Malignant neoplasm of lower lobe, right bronchus or lung: Secondary | ICD-10-CM

## 2016-10-18 DIAGNOSIS — T66XXXA Radiation sickness, unspecified, initial encounter: Secondary | ICD-10-CM

## 2016-10-18 LAB — COMPREHENSIVE METABOLIC PANEL
ALT: 8 U/L (ref 0–55)
AST: 16 U/L (ref 5–34)
Albumin: 3.2 g/dL — ABNORMAL LOW (ref 3.5–5.0)
Alkaline Phosphatase: 57 U/L (ref 40–150)
Anion Gap: 9 mEq/L (ref 3–11)
BUN: 11.1 mg/dL (ref 7.0–26.0)
CO2: 25 mEq/L (ref 22–29)
Calcium: 9.5 mg/dL (ref 8.4–10.4)
Chloride: 105 mEq/L (ref 98–109)
Creatinine: 0.9 mg/dL (ref 0.6–1.1)
EGFR: 68 mL/min/{1.73_m2} — ABNORMAL LOW (ref >90)
Glucose: 119 mg/dl (ref 70–140)
Potassium: 3.6 mEq/L (ref 3.5–5.1)
Sodium: 139 mEq/L (ref 136–145)
Total Bilirubin: 0.84 mg/dL (ref 0.20–1.20)
Total Protein: 7.3 g/dL (ref 6.4–8.3)

## 2016-10-18 LAB — CBC WITH DIFFERENTIAL/PLATELET
BASO%: 0.3 % (ref 0.0–2.0)
Basophils Absolute: 0 10*3/uL (ref 0.0–0.1)
EOS%: 0.8 % (ref 0.0–7.0)
Eosinophils Absolute: 0.1 10*3/uL (ref 0.0–0.5)
HCT: 37.9 % (ref 34.8–46.6)
HGB: 12.7 g/dL (ref 11.6–15.9)
LYMPH%: 11.1 % — ABNORMAL LOW (ref 14.0–49.7)
MCH: 31.6 pg (ref 25.1–34.0)
MCHC: 33.5 g/dL (ref 31.5–36.0)
MCV: 94.2 fL (ref 79.5–101.0)
MONO#: 0.6 10*3/uL (ref 0.1–0.9)
MONO%: 8.8 % (ref 0.0–14.0)
NEUT#: 5.3 10*3/uL (ref 1.5–6.5)
NEUT%: 79 % — ABNORMAL HIGH (ref 38.4–76.8)
Platelets: 243 10*3/uL (ref 145–400)
RBC: 4.02 10*6/uL (ref 3.70–5.45)
RDW: 16.8 % — ABNORMAL HIGH (ref 11.2–14.5)
WBC: 6.7 10*3/uL (ref 3.9–10.3)
lymph#: 0.7 10*3/uL — ABNORMAL LOW (ref 0.9–3.3)

## 2016-10-18 LAB — TSH: TSH: 0.495 m(IU)/L (ref 0.308–3.960)

## 2016-10-18 MED ORDER — LIDOCAINE VISCOUS 2 % MT SOLN: 10.0000 mL | Freq: Four times a day (QID) | OROMUCOSAL | 2 refills | Status: DC | PRN

## 2016-10-18 MED ORDER — ONDANSETRON 4 MG PO TBDP: 4.0000 mg | ORAL_TABLET | Freq: Three times a day (TID) | ORAL | 2 refills | Status: DC | PRN

## 2016-10-18 MED ORDER — SODIUM CHLORIDE 0.9 % IV SOLN
Freq: Once | INTRAVENOUS | Status: AC
  Administered 2016-10-18: 12:00:00 via INTRAVENOUS

## 2016-10-18 MED ORDER — ONDANSETRON HCL 4 MG/2ML IJ SOLN: 8.0000 mg | Freq: Once | INTRAMUSCULAR | Status: DC

## 2016-10-18 MED ORDER — SODIUM CHLORIDE 0.9 % IV SOLN
Freq: Once | INTRAVENOUS | Status: DC
  Filled 2016-10-18: qty 8

## 2016-10-18 MED ORDER — SODIUM CHLORIDE 0.9 % IV SOLN
Freq: Once | INTRAVENOUS | Status: DC
  Administered 2016-10-18: 13:00:00 via INTRAVENOUS

## 2016-10-18 MED ORDER — ONDANSETRON HCL 4 MG/2ML IJ SOLN
INTRAMUSCULAR | Status: AC
  Filled 2016-10-18: qty 4

## 2016-10-18 NOTE — Patient Instructions (Signed)
Dehydration, Adult Dehydration is a condition in which there is not enough fluid or water in the body. This happens when you lose more fluids than you take in. Important organs, such as the kidneys, brain, and heart, cannot function without a proper amount of fluids. Any loss of fluids from the body can lead to dehydration. Dehydration can range from mild to severe. This condition should be treated right away to prevent it from becoming severe. What are the causes? This condition may be caused by:  Vomiting.  Diarrhea.  Excessive sweating, such as from heat exposure or exercise.  Not drinking enough fluid, especially: ? When ill. ? While doing activity that requires a lot of energy.  Excessive urination.  Fever.  Infection.  Certain medicines, such as medicines that cause the body to lose excess fluid (diuretics).  Inability to access safe drinking water.  Reduced physical ability to get adequate water and food.  What increases the risk? This condition is more likely to develop in people:  Who have a poorly controlled long-term (chronic) illness, such as diabetes, heart disease, or kidney disease.  Who are age 65 or older.  Who are disabled.  Who live in a place with high altitude.  Who play endurance sports.  What are the signs or symptoms? Symptoms of mild dehydration may include:  Thirst.  Dry lips.  Slightly dry mouth.  Dry, warm skin.  Dizziness. Symptoms of moderate dehydration may include:  Very dry mouth.  Muscle cramps.  Dark urine. Urine may be the color of tea.  Decreased urine production.  Decreased tear production.  Heartbeat that is irregular or faster than normal (palpitations).  Headache.  Light-headedness, especially when you stand up from a sitting position.  Fainting (syncope). Symptoms of severe dehydration may include:  Changes in skin, such as: ? Cold and clammy skin. ? Blotchy (mottled) or pale skin. ? Skin that does  not quickly return to normal after being lightly pinched and released (poor skin turgor).  Changes in body fluids, such as: ? Extreme thirst. ? No tear production. ? Inability to sweat when body temperature is high, such as in hot weather. ? Very little urine production.  Changes in vital signs, such as: ? Weak pulse. ? Pulse that is more than 100 beats a minute when sitting still. ? Rapid breathing. ? Low blood pressure.  Other changes, such as: ? Sunken eyes. ? Cold hands and feet. ? Confusion. ? Lack of energy (lethargy). ? Difficulty waking up from sleep. ? Short-term weight loss. ? Unconsciousness. How is this diagnosed? This condition is diagnosed based on your symptoms and a physical exam. Blood and urine tests may be done to help confirm the diagnosis. How is this treated? Treatment for this condition depends on the severity. Mild or moderate dehydration can often be treated at home. Treatment should be started right away. Do not wait until dehydration becomes severe. Severe dehydration is an emergency and it needs to be treated in a hospital. Treatment for mild dehydration may include:  Drinking more fluids.  Replacing salts and minerals in your blood (electrolytes) that you may have lost. Treatment for moderate dehydration may include:  Drinking an oral rehydration solution (ORS). This is a drink that helps you replace fluids and electrolytes (rehydrate). It can be found at pharmacies and retail stores. Treatment for severe dehydration may include:  Receiving fluids through an IV tube.  Receiving an electrolyte solution through a feeding tube that is passed through your nose   and into your stomach (nasogastric tube, or NG tube).  Correcting any abnormalities in electrolytes.  Treating the underlying cause of dehydration. Follow these instructions at home:  If directed by your health care provider, drink an ORS: ? Make an ORS by following instructions on the  package. ? Start by drinking small amounts, about  cup (120 mL) every 5-10 minutes. ? Slowly increase how much you drink until you have taken the amount recommended by your health care provider.  Drink enough clear fluid to keep your urine clear or pale yellow. If you were told to drink an ORS, finish the ORS first, then start slowly drinking other clear fluids. Drink fluids such as: ? Water. Do not drink only water. Doing that can lead to having too little salt (sodium) in the body (hyponatremia). ? Ice chips. ? Fruit juice that you have added water to (diluted fruit juice). ? Low-calorie sports drinks.  Avoid: ? Alcohol. ? Drinks that contain a lot of sugar. These include high-calorie sports drinks, fruit juice that is not diluted, and soda. ? Caffeine. ? Foods that are greasy or contain a lot of fat or sugar.  Take over-the-counter and prescription medicines only as told by your health care provider.  Do not take sodium tablets. This can lead to having too much sodium in the body (hypernatremia).  Eat foods that contain a healthy balance of electrolytes, such as bananas, oranges, potatoes, tomatoes, and spinach.  Keep all follow-up visits as told by your health care provider. This is important. Contact a health care provider if:  You have abdominal pain that: ? Gets worse. ? Stays in one area (localizes).  You have a rash.  You have a stiff neck.  You are more irritable than usual.  You are sleepier or more difficult to wake up than usual.  You feel weak or dizzy.  You feel very thirsty.  You have urinated only a small amount of very dark urine over 6-8 hours. Get help right away if:  You have symptoms of severe dehydration.  You cannot drink fluids without vomiting.  Your symptoms get worse with treatment.  You have a fever.  You have a severe headache.  You have vomiting or diarrhea that: ? Gets worse. ? Does not go away.  You have blood or green matter  (bile) in your vomit.  You have blood in your stool. This may cause stool to look black and tarry.  You have not urinated in 6-8 hours.  You faint.  Your heart rate while sitting still is over 100 beats a minute.  You have trouble breathing. This information is not intended to replace advice given to you by your health care provider. Make sure you discuss any questions you have with your health care provider. Document Released: 01/10/2005 Document Revised: 08/07/2015 Document Reviewed: 03/06/2015 Elsevier Interactive Patient Education  2018 [employer].    Esophagitis Esophagitis is inflammation of the esophagus. The esophagus is the tube that carries food and liquids from your mouth to your stomach. Esophagitis can cause soreness or pain in the esophagus. This condition can make it difficult and painful to swallow. What are the causes? Most causes of esophagitis are not serious. Common causes of this condition include:  Gastroesophageal reflux disease (GERD). This is when stomach contents move back up into the esophagus (reflux).  Repeated vomiting.  An allergic-type reaction, especially caused by food allergies (eosinophilic esophagitis).  Injury to the esophagus by swallowing large pills with or without  water, or swallowing certain types of medicines.  Swallowing (ingesting) harmful chemicals, such as household cleaning products.  Heavy alcohol use.  An infection of the esophagus.This most often occurs in people who have a weakened immune system.  Radiation or chemotherapy treatment for cancer.  Certain diseases such as sarcoidosis, Crohn disease, and scleroderma.  What are the signs or symptoms? Symptoms of this condition include:  Difficult or painful swallowing.  Pain with swallowing acidic liquids, such as citrus juices.  Pain with burping.  Chest pain.  Difficulty breathing.  Nausea.  Vomiting.  Pain in the abdomen.  Weight loss.  Ulcers in the  mouth.  Patches of white material in the mouth (candidiasis).  Fever.  Coughing up blood or vomiting blood.  Stool that is black, tarry, or bright red.  How is this diagnosed? Your health care provider will take a medical history and perform a physical exam. You may also have other tests, including:  An endoscopy to examine your stomach and esophagus with a small camera.  A test that measures the acidity level in your esophagus.  A test that measures how much pressure is on your esophagus.  A barium swallow or modified barium swallow to show the shape, size, and functioning of your esophagus.  Allergy tests.  How is this treated? Treatment for this condition depends on the cause of your esophagitis. In some cases, steroids or other medicines may be given to help relieve your symptoms or to treat the underlying cause of your condition. You may have to make some lifestyle changes, such as:  Avoiding alcohol.  Quitting smoking.  Changing your diet.  Exercising.  Changing your sleep habits and your sleep environment.  Follow these instructions at home: Take these actions to decrease your discomfort and to help avoid complications. Diet  Follow a diet as recommended by your health care provider. This may involve avoiding foods and drinks such as: ? Coffee and tea (with or without caffeine). ? Drinks that contain alcohol. ? Energy drinks and sports drinks. ? Carbonated drinks or sodas. ? Chocolate and cocoa. ? Peppermint and mint flavorings. ? Garlic and onions. ? Horseradish. ? Spicy and acidic foods, including peppers, chili powder, curry powder, vinegar, hot sauces, and barbecue sauce. ? Citrus fruit juices and citrus fruits, such as oranges, lemons, and limes. ? Tomato-based foods, such as red sauce, chili, salsa, and pizza with red sauce. ? Fried and fatty foods, such as donuts, french fries, potato chips, and high-fat dressings. ? High-fat meats, such as hot dogs  and fatty cuts of red and white meats, such as rib eye steak, sausage, ham, and bacon. ? High-fat dairy items, such as whole milk, butter, and cream cheese.  Eat small, frequent meals instead of large meals.  Avoid drinking large amounts of liquid with your meals.  Avoid eating meals during the 2-3 hours before bedtime.  Avoid lying down right after you eat.  Do not exercise right after you eat.  Avoid foods and drinks that seem to make your symptoms worse. General instructions  Pay attention to any changes in your symptoms.  Take over-the-counter and prescription medicines only as told by your health care provider. Do not take aspirin, ibuprofen, or other NSAIDs unless your health care provider told you to do so.  If you have trouble taking pills, use a pill splitter to decrease the size of the pill. This will decrease the chance of the pill getting stuck or injuring your esophagus on the way  down. Also, drink water after you take a pill.  Do not use any tobacco products, including cigarettes, chewing tobacco, and e-cigarettes. If you need help quitting, ask your health care provider.  Wear loose-fitting clothing. Do not wear anything tight around your waist that causes pressure on your abdomen.  Raise (elevate) the head of your bed about 6 inches (15 cm).  Try to reduce your stress, such as with yoga or meditation. If you need help reducing stress, ask your health care provider.  If you are overweight, reduce your weight to an amount that is healthy for you. Ask your health care provider for guidance about a safe weight loss goal.  Keep all follow-up visits as told by your health care provider. This is important. Contact a health care provider if:  You have new symptoms.  You have unexplained weight loss.  You have difficulty swallowing, or it hurts to swallow.  You have wheezing or a persistent cough.  Your symptoms do not improve with treatment.  You have frequent  heartburn for more than two weeks. Get help right away if:  You have severe pain in your arms, neck, jaw, teeth, or back.  You feel sweaty, dizzy, or light-headed.  You have chest pain or shortness of breath.  You vomit and your vomit looks like blood or coffee grounds.  Your stool is bloody or black.  You have a fever.  You cannot swallow, drink, or eat. This information is not intended to replace advice given to you by your health care provider. Make sure you discuss any questions you have with your health care provider. Document Released: 02/18/2004 Document Revised: 06/18/2015 Document Reviewed: 05/07/2014 Elsevier Interactive Patient Education  [employer].

## 2016-10-18 NOTE — Telephone Encounter (Signed)
S/w Dr Julien Nordmann and he said to see pt today if these symptoms are new from when she last saw Dr Julien Nordmann. The pt states the sore throat, runny nose and vomiting are new. Had pt come in at 1030 for labs, and 11 with [facility].  Cbc, cmet ordered, inbasket placed. Pt is aware.

## 2016-10-18 NOTE — Progress Notes (Signed)
[facility] Progress Note   Nicole [name] [id number] [birth year] 76 y.o.  Nicole [name] is managed by Dr. Eilleen Kempf  Actively treated with chemotherapy: no  Current Therapy: The patient completed chemotherapy with carboplatin and Taxol. She completed 30/30 fractions of radiation therapy on 09/19/2016.   Last Treated: 08 / 27 / 2018.  She is scheduled to begin immunotherapy with Imfinzi on 10/20/2016.  Assessment/Plan:   Dehydration - Plan: 0.9 %  sodium chloride infusion  Orthostasis - Plan: 0.9 %  sodium chloride infusion  Non-intractable vomiting with nausea, unspecified vomiting type - Plan: ondansetron (ZOFRAN-ODT) 4 MG disintegrating tablet, ondansetron (ZOFRAN) injection 8 mg, DISCONTINUED: ondansetron (ZOFRAN) 16 mg in sodium chloride 0.9 % 50 mL IVPB, DISCONTINUED: ondansetron (ZOFRAN) 8 mg in sodium chloride 0.9 % 50 mL IVPB  Radiation-induced esophagitis - Plan: lidocaine (XYLOCAINE) 2 % solution   Dehydration and orthostasis: The patient was given 1 L of IV normal saline today.  Non-intractable vomiting with nausea: The patient was given a prescription for Zofran ADT and was given Zofran 8 mg IV today.  Radiation-induced esophagitis: The patient was given a prescription for viscous lidocaine to use with or without Carafate.  Please see After Visit Summary for patient specific instructions.  Future Appointments Date Time Provider [place]  10/20/2016 8:30 AM [facility] LAB 5 [facility] None  10/20/2016 9:00 AM Curcio, Erasmo Downer R, NP [facility] None  10/20/2016 10:00 AM [facility] J32 DNS [facility] None  11/02/2016 1:30 PM [facility] LAB 5 [facility] None  11/02/2016 2:00 PM Curt Bears, MD [facility] None  11/02/2016 3:00 PM [facility] I25 DNS [facility] None  01/02/2017 4:30 PM Gery Pray, MD [facility] None    No orders of the defined types were placed in this encounter.      Subjective:   Patient ID:   Nicole [name] is a 76 y.o. (DOB November 03, 1940) female.  Chief Complaint:  Chief Complaint  Patient presents with  . Nausea    with vomiting    HPI Nicole [name] is a 76 year old female with a history of a stage III A (T1b, N2, M0) non-small cell lung cancer, adenocarcinoma with a right lower lobe mass and right hilar and subcarinal lymphadenopathy dating to June 2018. She is status post completed chemotherapy with carboplatin and Taxol. She completed 30/30 fractions of radiation therapy on 09/19/2016.  She is scheduled to begin immunotherapy with Imfinzi on 10/20/2016. She presents to the office today with an ongoing cough with whitish sputum, nausea refractory to anti-emetics, anorexia, and pain with swallowing despite using Carafate. The patient has been eating less solids but has increased her fluid intake. She has been eating soft food such as bean curd, custards, and eggs. She still has pain with swallowing despite her intake of soft foods. She has been using Compazine 10 mg several times daily without good benefits. She continues to have increased oral secretions but is having no vomiting. She is having fatigue and abdominal bloating secondary to gas. She has lost 2 pounds since her visit of 10/12/2016. She denies fevers, chills, sweats.  Dysphagia:  Nicole [name] presents with dysphagia.  The patient describes multiple episode(s) with unchanged onset, located in the mid chest, and symptoms occur with almost all by mouth intake.  Patient reports pain with swallowing since having radiation.  Associated with the dysphagia, patient also complains of no other symptoms.  Patient denies no other symptoms.   Patient denies regurgitation of undigested food.  Medications: I  have reviewed the patient's current medications.  Allergies:  Allergies  Allergen Reactions  . No Known Allergies     Past Medical History:  Diagnosis Date  . Adenocarcinoma of right lung, stage 3 ([place]) 07/28/2016  .  Bronchitis    hx of  . 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 Diabetes mellitus    type 2  . Gallstones   . Headache   . History of radiation therapy 08/09/2016 to 09/19/2016   Right lung was treated to 60 Gy in 30 fractions at 2 Gy per fraction  . Hypertension   . Low back pain   . Lung mass    with lymphadenopathy  . Osteoarthritis     Past Surgical History:  Procedure Laterality Date  . ABDOMINAL HYSTERECTOMY  2004  . APPENDECTOMY  when 76 years old  . COLONOSCOPY  08-05-09   [name]  . INCONTINENCE SURGERY    . KNEE ARTHROSCOPY Left   . LEFT HEART CATHETERIZATION WITH CORONARY ANGIOGRAM N/A 07/19/2013   Procedure: LEFT HEART CATHETERIZATION WITH CORONARY ANGIOGRAM;  Surgeon: Sinclair Grooms, MD;  Location: [facility] CATH LAB;  Service: Cardiovascular;  Laterality: N/A;  . POLYPECTOMY  08-05-09   2 polyps  . TOTAL KNEE ARTHROPLASTY Left 03/28/2012   Procedure: LEFT TOTAL KNEE ARTHROPLASTY;  Surgeon: Tobi Bastos, MD;  Location: [facility] ORS;  Service: Orthopedics;  Laterality: Left;  . TUBAL LIGATION    . VIDEO BRONCHOSCOPY WITH ENDOBRONCHIAL ULTRASOUND N/A 07/11/2016   Procedure: VIDEO BRONCHOSCOPY WITH ENDOBRONCHIAL ULTRASOUND;  Surgeon: Marshell Garfinkel, MD;  Location: [place];  Service: Pulmonary;  Laterality: N/A;    Family History  Problem Relation Age of Onset  . Heart attack Father   . Heart disease Father   . Arthritis Other   . Cancer Other        colon, lst degree relative  . Diabetes Other        st degree relative  . Hyperlipidemia Other   . Hypertension Other   . Colon cancer Neg Hx     Social History   Social History  . Marital status: Married    Spouse name: N/A  . Number of children: N/A  . Years of education: N/A   Occupational History  . retired Retired   Social History Main Topics  . Smoking status: Never Smoker  . Smokeless tobacco: Never Used  . Alcohol use No  . Drug use: No  . Sexual activity: Not Currently   Other Topics Concern  . Not  on file   Social History Narrative   Regular exercise- Yes    Past Medical History, Surgical history, Social history, and Family history were reviewed and updated as appropriate.   Please see review of systems for further details on the patient's review from today.   Review of Systems:  Review of Systems  Constitutional: Positive for appetite change, fatigue and unexpected weight change. Negative for activity change, chills, diaphoresis and fever.  HENT: Positive for trouble swallowing.   Respiratory: Positive for cough. Negative for choking and shortness of breath.   Cardiovascular: Negative for chest pain.  Gastrointestinal: Positive for nausea. Negative for vomiting.    Objective:   Physical Exam:  BP (!) 141/77 (BP Location: Right Arm, Patient Position: Sitting)   Pulse 100   Temp 98.5 F (36.9 C) (Oral)   Resp 18   Ht 5' 8"  (1.727 m)   Wt 161 lb 14.4 oz (73.4 kg)   SpO2 100%  BMI 24.62 kg/m    Orthostatic Blood Pressure: Blood pressure:   sitting 142/79, standing 111/70 Pulse:   sitting 103, standing 117  ECOG: 1  Physical Exam  Constitutional: No distress.  HENT:  Head: Normocephalic and atraumatic.  Mouth/Throat: Oropharynx is clear and moist. No oropharyngeal exudate.  Neck: Normal range of motion. Neck supple.  Cardiovascular: Normal rate, regular rhythm and normal heart sounds.  Exam reveals no gallop and no friction rub.   No murmur heard. Pulmonary/Chest: Effort normal and breath sounds normal. No respiratory distress. She has no wheezes. She has no rales.  Lymphadenopathy:    She has no cervical adenopathy.  Neurological: She is alert. Coordination normal.  Skin: Skin is warm and dry. She is not diaphoretic.  Increased tenting of the dorsal surfaces of the hands bilaterally.    Lab Review:     Component Value Date/Time   NA 139 10/18/2016 1053   K 3.6 10/18/2016 1053   CL 102 09/23/2016 1322   CO2 25 10/18/2016 1053   GLUCOSE 119  10/18/2016 1053   BUN 11.1 10/18/2016 1053   CREATININE 0.9 10/18/2016 1053   CALCIUM 9.5 10/18/2016 1053   PROT 7.3 10/18/2016 1053   ALBUMIN 3.2 (L) 10/18/2016 1053   AST 16 10/18/2016 1053   ALT 8 10/18/2016 1053   ALKPHOS 57 10/18/2016 1053   BILITOT 0.84 10/18/2016 1053   GFRNONAA 59 (L) 09/23/2016 1322   GFRAA >60 09/23/2016 1322       Component Value Date/Time   WBC 6.7 10/18/2016 1053   WBC 2.0 (L) 09/23/2016 1322   RBC 4.02 10/18/2016 1053   RBC 3.99 09/23/2016 1322   HGB 12.7 10/18/2016 1053   HCT 37.9 10/18/2016 1053   PLT 243 10/18/2016 1053   MCV 94.2 10/18/2016 1053   MCH 31.6 10/18/2016 1053   MCH 31.3 09/23/2016 1322   MCHC 33.5 10/18/2016 1053   MCHC 34.5 09/23/2016 1322   RDW 16.8 (H) 10/18/2016 1053   LYMPHSABS 0.7 (L) 10/18/2016 1053   MONOABS 0.6 10/18/2016 1053   EOSABS 0.1 10/18/2016 1053   BASOSABS 0.0 10/18/2016 1053   -------------------------------  Imaging from last 24 hours (if applicable):  Radiology interpretation: Dg Chest 2 View  Result Date: 09/22/2016 CLINICAL DATA:  Cough and shortness of breath. EXAM: CHEST  2 VIEW COMPARISON:  July 11, 2016 FINDINGS: The heart, hila, and mediastinum are unchanged unremarkable. Mild atelectasis in the bases. No nodules or masses. No focal infiltrates. No acute abnormalities. IMPRESSION: No active cardiopulmonary disease. Electronically Signed   By: Dorise Bullion III M.D   On: 09/22/2016 20:56   Ct Chest W Contrast  Result Date: 10/10/2016 CLINICAL DATA:  Lung cancer.  Cough. EXAM: CT CHEST WITH CONTRAST TECHNIQUE: Multidetector CT imaging of the chest was performed during intravenous contrast administration. CONTRAST:  75 cc Isovue 300 COMPARISON:  06/28/2016 FINDINGS: Cardiovascular: The heart size is normal. No pericardial effusion. Coronary artery calcification is evident. Atherosclerotic calcification is noted in the wall of the thoracic aorta. Mediastinum/Nodes: High right paraesophageal lymph  node towards the thoracic inlet has progressed in the interval measuring 8 mm short axis today (image 21 series 2) compared to 3 mm previously. Subcarinal lymph node measured 16 mm short axis on the prior study is 13 mm short axis today. Circumferential wall thickening noted mid esophagus (see image 67 series 2). No hilar lymphadenopathy No mediastinal lymphadenopathy. Lungs/Pleura: Posterior right lower lobe lesion smaller than prior measuring 2.4 x 1.3 cm today  compared to 3.2 x 1.9 cm previously. 3 mm anterior left upper lobe pulmonary nodule is stable. No focal airspace consolidation. No pulmonary edema or pleural effusion. Upper Abdomen: The liver shows diffusely decreased attenuation suggesting steatosis. Laminar flow of un opacified blood noted in the portal vein. No adrenal nodule or mass. Musculoskeletal: Bone windows reveal no worrisome lytic or sclerotic osseous lesions. IMPRESSION: 1. High right paraesophageal lymph node has increased slightly in size in interval. Close attention on follow-up recommended. 2. Interval decrease in some parietal lymphadenopathy and associated interval decrease in size of the posterior right lower lobe lesion. 3. Interval development circumferential wall thickening midesophagus. Esophagitis would be a consideration. 4. Coronary artery and Aortic Atherosclerois (ICD10-170.0) Electronically Signed   By: Misty Stanley M.D.   On: 10/10/2016 13:53        This was discussed with Dr. Julien Nordmann.

## 2016-10-18 NOTE — Telephone Encounter (Signed)
Pt finished carbo/taxol and xrt 09/19/16. She is to start immunotherapy imfinzi on Thursday. Yesterday she developed n/v after breakfast and all day. She is able to keep clear fluids like chrysanthemum tea and soups. She has gas in stomach. She cannot say how many times she vomited. Today she has kept her pills down. She has used her compazine this AM.  She has a swollen sore throat. Sore throat since finishing chemo/radiation. She was having a lot of sputum with cough after chemo/radiation. The copious sputum has cleared up. She still has cough with lesser amounts of clear sputum. She has not been eating solids since sometime last week, d/t her sore throat. She has had a  runny nose since 2 days ago.   She feels unable at present to start immunotherapy.

## 2016-10-18 NOTE — Telephone Encounter (Signed)
Scheduled appt per 9/25 sch msg

## 2016-10-19 ENCOUNTER — Other Ambulatory Visit: Payer: Self-pay | Admitting: Oncology

## 2016-10-19 ENCOUNTER — Other Ambulatory Visit: Payer: Self-pay | Admitting: Medical Oncology

## 2016-10-20 ENCOUNTER — Encounter: Payer: Self-pay | Admitting: Hematology

## 2016-10-20 ENCOUNTER — Other Ambulatory Visit: Payer: Medicare Other

## 2016-10-20 ENCOUNTER — Ambulatory Visit (HOSPITAL_BASED_OUTPATIENT_CLINIC_OR_DEPARTMENT_OTHER): Payer: Medicare Other | Admitting: Oncology

## 2016-10-20 ENCOUNTER — Ambulatory Visit (HOSPITAL_BASED_OUTPATIENT_CLINIC_OR_DEPARTMENT_OTHER): Payer: Medicare Other

## 2016-10-20 ENCOUNTER — Ambulatory Visit: Payer: Self-pay | Admitting: Radiation Oncology

## 2016-10-20 VITALS — BP 120/68 | HR 104 | Temp 98.6°F | Resp 22 | Ht 68.0 in | Wt 160.0 lb

## 2016-10-20 DIAGNOSIS — C3491 Malignant neoplasm of unspecified part of right bronchus or lung: Secondary | ICD-10-CM

## 2016-10-20 DIAGNOSIS — C3431 Malignant neoplasm of lower lobe, right bronchus or lung: Secondary | ICD-10-CM

## 2016-10-20 DIAGNOSIS — R509 Fever, unspecified: Secondary | ICD-10-CM

## 2016-10-20 DIAGNOSIS — Z5112 Encounter for antineoplastic immunotherapy: Secondary | ICD-10-CM

## 2016-10-20 DIAGNOSIS — C3432 Malignant neoplasm of lower lobe, left bronchus or lung: Secondary | ICD-10-CM

## 2016-10-20 MED ORDER — DURVALUMAB 500 MG/10ML IV SOLN
10.0000 mg/kg | Freq: Once | INTRAVENOUS | Status: AC
  Administered 2016-10-20: 740 mg via INTRAVENOUS
  Filled 2016-10-20: qty 4.8

## 2016-10-20 MED ORDER — SODIUM CHLORIDE 0.9 % IV SOLN
Freq: Once | INTRAVENOUS | Status: AC
  Administered 2016-10-20: 10:00:00 via INTRAVENOUS

## 2016-10-20 NOTE — Progress Notes (Signed)
[name]:    Nicole Lima, MD [address] [address] [place] [address]   DIAGNOSIS: Stage IIIa (T1b, N2, M0) non-small cell lung cancer, adenocarcinoma presented with right lower lobe lung mass in addition to right hilar and subcarinal lymphadenopathy diagnosed in June 2018.  Biomarker Findings Tumor Mutational Burden - TMB-Intermediate (8 Muts/Mb) Microsatellite Status - Cannot Be Determined Genomic Findings For a complete list of the genes assayed, please refer to the Appendix. EGFR E709K, G719S TKW4OX B353G DJM4Q6 splice site 834-1D>Q 7 Disease relevant genes with no reportable alterations: KRAS, ALK, BRAF, MET, ERBB2, RET, ROS1  PDL1 Expression 70%.  SUMMARY OF ONCOLOGIC HISTORY:  No history exists.   PRIOR THERAPY:Concurrent chemoradiation with weekly carboplatin for AUC of 2 and paclitaxel 45 MG/M2. Status post 7 cycles with partial response.  CURRENT THERAPY: Consolidation treatment with immunotherapy with Imfinzi (Durvalumab) 10 MG/KG every 2 weeks, first dose 10/20/2016.  INTERVAL HISTORY: Nicole [name] 76 y.o. female returns for routine follow-up with her family member. The patient is feeling fine today and recovering well from the previous course of concurrent chemoradiation except for mild dry cough as well as mild odynophagia which is improving. She had a low grade fever yesterday without chills. No fever today. She is eating better but she lost 3 pounds since her last visit. She denied having any current chest pain, shortness of breath, cough or hemoptysis. She has no nausea, vomiting, diarrhea or constipation. The patient is here for evaluation prior to cycle 1 of Imfinzi.      Patient Active Problem List   Diagnosis Date Noted  . Encounter for antineoplastic immunotherapy 10/12/2016  . Cancer-related pain 10/06/2016  . Atrial flutter ([place]) 10/05/2016  . Adenocarcinoma of right lung, stage 3 ([place]) 07/28/2016  .  Encounter for antineoplastic chemotherapy 07/28/2016  . Goals of care, counseling/discussion 07/28/2016  . Pancreatic cyst 11/09/2015  . Estrogen deficiency 11/09/2015  . Routine general medical examination at a health care facility 06/30/2014  . Gout 01/18/2013  . Hypertriglyceridemia 08/22/2012  . Obesity (BMI 30-39.9) 08/22/2012  . Osteopenia 10/31/2011  . Type II diabetes mellitus with manifestations ([place]) 01/20/2009  . Essential hypertension 01/20/2009  . OSTEOARTHRITIS 01/20/2009    is allergic to no known allergies.  MEDICAL HISTORY: Past Medical History:  Diagnosis Date  . Adenocarcinoma of right lung, stage 3 ([place]) 07/28/2016  . Bronchitis    hx of  . Cancer [facility]) 2004   uterine/cervical  . Diabetes mellitus    type 2  . Gallstones   . Headache   . History of radiation therapy 08/09/2016 to 09/19/2016   Right lung was treated to 60 Gy in 30 fractions at 2 Gy per fraction  . Hypertension   . Low back pain   . Lung mass    with lymphadenopathy  . Osteoarthritis     SURGICAL HISTORY: Past Surgical History:  Procedure Laterality Date  . ABDOMINAL HYSTERECTOMY  2004  . APPENDECTOMY  when 76 years old  . COLONOSCOPY  08-05-09   [name]  . INCONTINENCE SURGERY    . KNEE ARTHROSCOPY Left   . LEFT HEART CATHETERIZATION WITH CORONARY ANGIOGRAM N/A 07/19/2013   Procedure: LEFT HEART CATHETERIZATION WITH CORONARY ANGIOGRAM;  Surgeon: Sinclair Grooms, MD;  Location: [facility] CATH LAB;  Service: Cardiovascular;  Laterality: N/A;  . POLYPECTOMY  08-05-09   2 polyps  . TOTAL KNEE ARTHROPLASTY Left 03/28/2012   Procedure: LEFT TOTAL KNEE ARTHROPLASTY;  Surgeon: Tobi Bastos, MD;  Location: [facility] ORS;  Service: Orthopedics;  Laterality: Left;  . TUBAL LIGATION    . VIDEO BRONCHOSCOPY WITH ENDOBRONCHIAL ULTRASOUND N/A 07/11/2016   Procedure: VIDEO BRONCHOSCOPY WITH ENDOBRONCHIAL ULTRASOUND;  Surgeon: Marshell Garfinkel, MD;  Location: [facility];  Service: Pulmonary;  Laterality: N/A;     SOCIAL HISTORY: Social History   Social History  . Marital status: Married    Spouse name: N/A  . Number of children: N/A  . Years of education: N/A   Occupational History  . retired Retired   Social History Main Topics  . Smoking status: Never Smoker  . Smokeless tobacco: Never Used  . Alcohol use No  . Drug use: No  . Sexual activity: Not Currently   Other Topics Concern  . Not on file   Social History Narrative   Regular exercise- Yes    FAMILY HISTORY: Family History  Problem Relation Age of Onset  . Heart attack Father   . Heart disease Father   . Arthritis Other   . Cancer Other        colon, lst degree relative  . Diabetes Other        st degree relative  . Hyperlipidemia Other   . Hypertension Other   . Colon cancer Neg Hx     Review of Systems  Constitutional: Negative.   HENT:  Negative.   Eyes: Negative.   Respiratory: Positive for cough. Negative for hemoptysis, shortness of breath and wheezing.   Cardiovascular: Negative.   Gastrointestinal: Negative.   Genitourinary: Negative.    Musculoskeletal: Negative.   Skin: Negative.   Neurological: Negative.   Hematological: Negative.   Psychiatric/Behavioral: Negative.       PHYSICAL EXAMINATION  ECOG PERFORMANCE STATUS: 1 - Symptomatic but completely ambulatory  Vitals:   10/20/16 0839  BP: 120/68  Pulse: (!) 104  Resp: (!) 22  Temp: 98.6 F (37 C)  SpO2: 100%    Physical Exam  Constitutional: She is oriented to person, place, and time and well-developed, well-nourished, and in no distress. No distress.  HENT:  Head: Normocephalic.  Mouth/Throat: Oropharynx is clear and moist. No oropharyngeal exudate.  Eyes: Conjunctivae are normal. Right eye exhibits no discharge. Left eye exhibits no discharge. No scleral icterus.  Neck: Normal range of motion. Neck supple.  Cardiovascular: Normal rate, regular rhythm, normal heart sounds and intact distal pulses.   Pulmonary/Chest: Effort  normal and breath sounds normal. No respiratory distress. She has no wheezes. She has no rales.  Abdominal: Soft. Bowel sounds are normal. She exhibits no distension and no mass. There is no tenderness.  Musculoskeletal: Normal range of motion. She exhibits no edema.  Lymphadenopathy:    She has no cervical adenopathy.  Neurological: She is alert and oriented to person, place, and time. She exhibits normal muscle tone. Gait normal. Coordination normal.  Skin: Skin is warm and dry. No rash noted. She is not diaphoretic. No erythema. No pallor.  Psychiatric: Mood, memory, affect and judgment normal.  Vitals reviewed.   LABORATORY DATA:  CBC    Component Value Date/Time   WBC 6.7 10/18/2016 1053   WBC 2.0 (L) 09/23/2016 1322   RBC 4.02 10/18/2016 1053   RBC 3.99 09/23/2016 1322   HGB 12.7 10/18/2016 1053   HCT 37.9 10/18/2016 1053   PLT 243 10/18/2016 1053   MCV 94.2 10/18/2016 1053   MCH 31.6 10/18/2016 1053   MCH 31.3 09/23/2016 1322   MCHC 33.5 10/18/2016  1053   MCHC 34.5 09/23/2016 1322   RDW 16.8 (H) 10/18/2016 1053   LYMPHSABS 0.7 (L) 10/18/2016 1053   MONOABS 0.6 10/18/2016 1053   EOSABS 0.1 10/18/2016 1053   BASOSABS 0.0 10/18/2016 1053    CMP     Component Value Date/Time   NA 139 10/18/2016 1053   K 3.6 10/18/2016 1053   CL 102 09/23/2016 1322   CO2 25 10/18/2016 1053   GLUCOSE 119 10/18/2016 1053   BUN 11.1 10/18/2016 1053   CREATININE 0.9 10/18/2016 1053   CALCIUM 9.5 10/18/2016 1053   PROT 7.3 10/18/2016 1053   ALBUMIN 3.2 (L) 10/18/2016 1053   AST 16 10/18/2016 1053   ALT 8 10/18/2016 1053   ALKPHOS 57 10/18/2016 1053   BILITOT 0.84 10/18/2016 1053   GFRNONAA 59 (L) 09/23/2016 1322   GFRAA >60 09/23/2016 1322     RADIOGRAPHIC STUDIES:  No results found.  ASSESSMENT and THERAPY PLAN:   Adenocarcinoma of right lung, stage 3 ([place]) This is a very pleasant 76 year old Asian female recently diagnosed with a stage IIIa non-small cell lung cancer,  adenocarcinoma. She underwent a course of concurrent chemoradiation with weekly carboplatin and paclitaxel status post 7 cycles. The patient tolerated the previous course fairly well except for mild odynophagia and dry cough. She had repeat CT scan of the chest performed recently. Her scan showed improvement of her disease and the right lower lobe lung lesion as well as mediastinal lymphadenopathy but there was mild increase in high right paraesophageal lymph node which is currently nonspecific and not pathologic by size criteria but still suspicious and will need close follow-up on upcoming scan.  The patient is due to begin consolidation treatment with immunotherapy with Imfinzi (Durvalumab) 10 MG/KG every 2 weeks for a total of one year unless the patient developed toxicity or disease progression. She will proceed with the first dose as scheduled today.  I have again reviewed the adverse effect of this treatment including but not limited to immunotherapy mediated skin rash, diarrhea, or inflammation of the lung, kidney, liver, thyroid or other endocrine dysfunction including type 1 diabetes mellitus. The patient would like to proceed with treatment as planned.  She will come back for follow-up visit in 3 weeks for evaluation before starting cycle #2.  She was instructed to call us if she develops a fever of 100.5 or higher or if her cough worsens.   The patient was advised to call immediately if she has any concerning symptoms in the interval. The patient voices understanding of current disease status and treatment options and is in agreement with the current care plan. All questions were answered. The patient knows to call the clinic with any problems, questions or concerns. We can certainly see the patient much sooner if necessary.   No orders of the defined types were placed in this encounter.   All questions were answered. The patient knows to call the clinic with any problems, questions  or concerns. We can certainly see the patient much sooner if necessary.  Mikey Bussing, NP 10/20/2016

## 2016-10-20 NOTE — Progress Notes (Signed)
Received outcome of PA for Ondansetron through Cover My Meds.  Nicole [name] Key: [id number] Need help? Call us at [phone]  Outcome  Approvedtoday  Effective from 10/20/2016 through 10/20/2017.  DrugOndansetron 4MG  OR TBDP  FormBlue Cross [place] Medicare Part D General Authorization Form  Your request has been approved  Effective from 10/20/2016 through 10/20/2017.  Called [name] to advise of approval.

## 2016-10-20 NOTE — Progress Notes (Signed)
Received PA request for Ondansetron.  Submitted via Cover My Meds.  Roshanna Paschen (Key: [id number])   Your information has been submitted to [place]. [employer] [place] will review the request and notify you of the determination decision directly, typically within 3 business days of your submission and once all necessary information is received. You will also receive your request decision electronically. To check for an update later, open the request again from your dashboard. If [employer] [place] has not responded within the specified timeframe or if you have any questions about your PA submission, contact [place] directly at [facility]) [phone] or ([place]) [phone].

## 2016-10-20 NOTE — Assessment & Plan Note (Signed)
This is a very pleasant 76 year old Asian female recently diagnosed with a stage IIIa non-small cell lung cancer, adenocarcinoma. She underwent a course of concurrent chemoradiation with weekly carboplatin and paclitaxel status post 7 cycles. The patient tolerated the previous course fairly well except for mild odynophagia and dry cough. She had repeat CT scan of the chest performed recently. Her scan showed improvement of her disease and the right lower lobe lung lesion as well as mediastinal lymphadenopathy but there was mild increase in high right paraesophageal lymph node which is currently nonspecific and not pathologic by size criteria but still suspicious and will need close follow-up on upcoming scan.  The patient is due to begin consolidation treatment with immunotherapy with Imfinzi (Durvalumab) 10 MG/KG every 2 weeks for a total of one year unless the patient developed toxicity or disease progression. She will proceed with the first dose as scheduled today.  I have again reviewed the adverse effect of this treatment including but not limited to immunotherapy mediated skin rash, diarrhea, or inflammation of the lung, kidney, liver, thyroid or other endocrine dysfunction including type 1 diabetes mellitus. The patient would like to proceed with treatment as planned.  She will come back for follow-up visit in 3 weeks for evaluation before starting cycle #2.  She was instructed to call us if she develops a fever of 100.5 or higher or if her cough worsens.   The patient was advised to call immediately if she has any concerning symptoms in the interval. The patient voices understanding of current disease status and treatment options and is in agreement with the current care plan. All questions were answered. The patient knows to call the clinic with any problems, questions or concerns. We can certainly see the patient much sooner if necessary.

## 2016-10-20 NOTE — Patient Instructions (Signed)
[facility] Discharge Instructions for Patients Receiving Chemotherapy  Today you received the following chemotherapy agents Imfinzi  To help prevent nausea and vomiting after your treatment, we encourage you to take your nausea medication as directed   If you develop nausea and vomiting that is not controlled by your nausea medication, call the clinic.   BELOW ARE SYMPTOMS THAT SHOULD BE REPORTED IMMEDIATELY:  *FEVER GREATER THAN 100.5 F  *CHILLS WITH OR WITHOUT FEVER  NAUSEA AND VOMITING THAT IS NOT CONTROLLED WITH YOUR NAUSEA MEDICATION  *UNUSUAL SHORTNESS OF BREATH  *UNUSUAL BRUISING OR BLEEDING  TENDERNESS IN MOUTH AND THROAT WITH OR WITHOUT PRESENCE OF ULCERS  *URINARY PROBLEMS  *BOWEL PROBLEMS  UNUSUAL RASH Items with * indicate a potential emergency and should be followed up as soon as possible.  Feel free to call the clinic should you have any questions or concerns. The clinic phone number is [phone].  Please show the [place] at check-in to the Emergency Department and triage nurse.   Durvalumab (Imfinzi) injection What is this medicine? DURVALUMAB (dur VAL ue mab) is a monoclonal antibody. It is used to treat urothelial cancer. This medicine may be used for other purposes; ask your health care provider or pharmacist if you have questions. COMMON BRAND NAME(S): IMFINZI What should I tell my health care provider before I take this medicine? They need to know if you have any of these conditions: -diabetes -immune system problems -infection -inflammatory bowel disease -kidney disease -liver disease -lung or breathing disease -lupus -organ transplant -stomach or intestine problems -thyroid disease -an unusual or allergic reaction to durvalumab, other medicines, foods, dyes, or preservatives -pregnant or trying to get pregnant -breast-feeding How should I use this medicine? This medicine is for infusion into a vein. It is  given by a health care professional in a hospital or clinic setting. A special MedGuide will be given to you before each treatment. Be sure to read this information carefully each time. Talk to your pediatrician regarding the use of this medicine in children. Special care may be needed. Overdosage: If you think you have taken too much of this medicine contact a poison control center or emergency room at once. NOTE: This medicine is only for you. Do not share this medicine with others. What if I miss a dose? It is important not to miss your dose. Call your doctor or health care professional if you are unable to keep an appointment. What may interact with this medicine? Interactions have not been studied. This list may not describe all possible interactions. Give your health care provider a list of all the medicines, herbs, non-prescription drugs, or dietary supplements you use. Also tell them if you smoke, drink alcohol, or use illegal drugs. Some items may interact with your medicine. What should I watch for while using this medicine? This drug may make you feel generally unwell. Continue your course of treatment even though you feel ill unless your doctor tells you to stop. You may need blood work done while you are taking this medicine. Do not become pregnant while taking this medicine or for 3 months after stopping it. Women should inform their doctor if they wish to become pregnant or think they might be pregnant. There is a potential for serious side effects to an unborn child. Talk to your health care professional or pharmacist for more information. Do not breast-feed an infant while taking this medicine or for 3 months after stopping it. What side  effects may I notice from receiving this medicine? Side effects that you should report to your doctor or health care professional as soon as possible: -allergic reactions like skin rash, itching or hives, swelling of the face, lips, or  tongue -black, tarry stools -bloody or watery diarrhea -breathing problems -change in emotions or moods -change in sex drive -changes in vision -chest pain or chest tightness -chills -confusion -cough -facial flushing -fever -headache -signs and symptoms of high blood sugar such as dizziness; dry mouth; dry skin; fruity breath; nausea; stomach pain; increased hunger or thirst; increased urination -signs and symptoms of liver injury like dark yellow or brown urine; general ill feeling or flu-like symptoms; light-colored stools; loss of appetite; nausea; right upper belly pain; unusually weak or tired; yellowing of the eyes or skin -stomach pain -trouble passing urine or change in the amount of urine -weight gain or weight loss Side effects that usually do not require medical attention (report these to your doctor or health care professional if they continue or are bothersome): -bone pain -constipation -loss of appetite -muscle pain -nausea -swelling of the ankles, feet, hands -tiredness This list may not describe all possible side effects. Call your doctor for medical advice about side effects. You may report side effects to FDA at 1-800-FDA-1088. Where should I keep my medicine? This drug is given in a hospital or clinic and will not be stored at home. NOTE: This sheet is a summary. It may not cover all possible information. If you have questions about this medicine, talk to your doctor, pharmacist, or health care provider.  2018 Elsevier/Gold Standard (2015-08-14 15:50:36)

## 2016-10-24 ENCOUNTER — Other Ambulatory Visit: Payer: Self-pay | Admitting: Internal Medicine

## 2016-10-24 ENCOUNTER — Telehealth: Payer: Self-pay

## 2016-10-24 DIAGNOSIS — E118 Type 2 diabetes mellitus with unspecified complications: Secondary | ICD-10-CM

## 2016-10-24 NOTE — Telephone Encounter (Signed)
Pt called for advice. She is not eating well for several weeks. The imfinzi C1 on 9/27 made this worse. She has no eaten anything solid since imfinzi, before imfinzi solid intake was very limited. She has lost 3# since her last visit. She can tolerate broths, she cannot tolerate sweet like boost or ensure. She cannot tolerate creamy. She drinks coconut water.  She states she is getting in about 64 oz fluid/broth/water per day.  Constipation since last good BM 9/25. She has used one colace this AM.  Instructed her to add miralax.  S/w Dr Julien Nordmann. The medications for appetite stimulation are not advisable for the pt at this time.  Pt was offered visit with [facility] for possible fluids, but she feels she is adequately hydrated at this time and declined.   Did discuss that pt has not tried zofran ODT yet. Instructed her to use zofran 30-45 minutes before even looking at food. Also that she can alternate zofran and compazine to help curb nausea.

## 2016-10-25 ENCOUNTER — Encounter: Payer: Self-pay | Admitting: Medical

## 2016-10-25 ENCOUNTER — Other Ambulatory Visit: Payer: Self-pay | Admitting: Interventional Cardiology

## 2016-10-25 NOTE — Progress Notes (Signed)
Pt's Ondansetron was approved from 10/20/16 to 10/20/17.  Approval #: [id number].

## 2016-10-25 NOTE — Progress Notes (Signed)
Submitted auth request for Ondansetron today.  Status is pending.

## 2016-10-26 ENCOUNTER — Telehealth: Payer: Self-pay | Admitting: *Deleted

## 2016-10-26 NOTE — Telephone Encounter (Signed)
Pt called requesting a call back from nurse about constipation and hemorrhoids problem.  Called pt back without answer.  Left message on voice mail asking pt to call office back.

## 2016-10-26 NOTE — Telephone Encounter (Addendum)
I called pt back and she describes a lump  at her anal opening and stool is coming around it with a blood clot mixed with stool. She has to wear pads due to stool frequency. She said she thinks her constipation is resolved.   She reports the "lump " is painful and hard. She is treating herself for "hemmorhoids" , using sitz baths , prep H and oxycodone to provide relief. Per [facility] tomorrow. Pt given lab and smc appt.

## 2016-10-27 ENCOUNTER — Ambulatory Visit (HOSPITAL_BASED_OUTPATIENT_CLINIC_OR_DEPARTMENT_OTHER): Payer: Medicare Other | Admitting: Medical

## 2016-10-27 ENCOUNTER — Other Ambulatory Visit (HOSPITAL_BASED_OUTPATIENT_CLINIC_OR_DEPARTMENT_OTHER): Payer: Medicare Other

## 2016-10-27 VITALS — BP 121/78 | HR 78 | Temp 98.2°F | Resp 17 | Ht 68.0 in | Wt 158.6 lb

## 2016-10-27 DIAGNOSIS — K5903 Drug induced constipation: Secondary | ICD-10-CM

## 2016-10-27 DIAGNOSIS — R233 Spontaneous ecchymoses: Secondary | ICD-10-CM | POA: Diagnosis not present

## 2016-10-27 DIAGNOSIS — C3491 Malignant neoplasm of unspecified part of right bronchus or lung: Secondary | ICD-10-CM | POA: Diagnosis not present

## 2016-10-27 DIAGNOSIS — C3431 Malignant neoplasm of lower lobe, right bronchus or lung: Secondary | ICD-10-CM

## 2016-10-27 LAB — COMPREHENSIVE METABOLIC PANEL
ALT: 8 U/L (ref 0–55)
AST: 15 U/L (ref 5–34)
Albumin: 3.1 g/dL — ABNORMAL LOW (ref 3.5–5.0)
Alkaline Phosphatase: 50 U/L (ref 40–150)
Anion Gap: 9 mEq/L (ref 3–11)
BUN: 17.9 mg/dL (ref 7.0–26.0)
CO2: 23 mEq/L (ref 22–29)
Calcium: 9.3 mg/dL (ref 8.4–10.4)
Chloride: 104 mEq/L (ref 98–109)
Creatinine: 0.9 mg/dL (ref 0.6–1.1)
EGFR: 63 mL/min/{1.73_m2} — ABNORMAL LOW (ref >90)
Glucose: 125 mg/dl (ref 70–140)
Potassium: 3.7 mEq/L (ref 3.5–5.1)
Sodium: 136 mEq/L (ref 136–145)
Total Bilirubin: 0.61 mg/dL (ref 0.20–1.20)
Total Protein: 7 g/dL (ref 6.4–8.3)

## 2016-10-27 LAB — CBC WITH DIFFERENTIAL/PLATELET
BASO%: 0.2 % (ref 0.0–2.0)
Basophils Absolute: 0 10*3/uL (ref 0.0–0.1)
EOS%: 2.2 % (ref 0.0–7.0)
Eosinophils Absolute: 0.1 10*3/uL (ref 0.0–0.5)
HCT: 35.3 % (ref 34.8–46.6)
HGB: 11.5 g/dL — ABNORMAL LOW (ref 11.6–15.9)
LYMPH%: 10.8 % — ABNORMAL LOW (ref 14.0–49.7)
MCH: 31.2 pg (ref 25.1–34.0)
MCHC: 32.6 g/dL (ref 31.5–36.0)
MCV: 95.7 fL (ref 79.5–101.0)
MONO#: 0.6 10*3/uL (ref 0.1–0.9)
MONO%: 9.7 % (ref 0.0–14.0)
NEUT#: 4.6 10*3/uL (ref 1.5–6.5)
NEUT%: 77.1 % — ABNORMAL HIGH (ref 38.4–76.8)
Platelets: 208 10*3/uL (ref 145–400)
RBC: 3.69 10*6/uL — ABNORMAL LOW (ref 3.70–5.45)
RDW: 15.1 % — ABNORMAL HIGH (ref 11.2–14.5)
WBC: 6 10*3/uL (ref 3.9–10.3)
lymph#: 0.6 10*3/uL — ABNORMAL LOW (ref 0.9–3.3)

## 2016-10-27 NOTE — Patient Instructions (Signed)
Senna-S, up to 8 tablets per day  OR  Colace 100 mg, 1 to 2 capsules twice daily and Miralax 17 grams in 8 ounces of liquid once daily as needed   Can also use (if needed):   Add 30 ml of Milk of Magnesia add to 1 small juice glass of prune juice, then heat in microwave for 20 seconds   Drink at least 64 ounces of water or caffeine free beverage daily.

## 2016-10-27 NOTE — Progress Notes (Signed)
Symptoms Management Clinic Progress Note   Nicole [name] [id number] April 06, 1940 76 y.o.  Nicole [name] is managed by Dr. Fanny Bien. [name]   Actively treated with chemotherapy: yes  Current Therapy: Imfinzi  Last Treated: 09 / 27 / 2018  Assessment: Plan:    Drug-induced constipation  Petechial rash   Drug-induced constipation: The patient was instructed to begin using senna-S up to 8 tablets per day as needed for constipation or Colace 100 mg, 1-2 capsules twice daily and MiraLAX 17 g in 8 ounces of liquid once daily as needed. The patient was also told that she could use 30 miles of milk of magnesia added to one small juice glass of prune juice then he did not microwave for 20 seconds if the above are not of benefit. She is also been told to drink at least 64 ounces of water or caffeine free beverages daily.  Petechial rash: The patient's CBC return today with a platelet count of 208,000. I instructed the patient to discuss this with her provider on her return.   Please see After Visit Summary for patient specific instructions.  Future Appointments Date Time Provider [place]  11/02/2016 1:30 PM [facility] LAB 5 [facility] None  11/02/2016 2:00 PM Curcio, Erasmo Downer R, NP [facility] None  11/02/2016 3:00 PM [facility] I25 DNS [facility] None  01/02/2017 4:30 PM Gery Pray, MD [facility] None    No orders of the defined types were placed in this encounter.     Subjective:   Patient ID:  Nicole [name] is a 76 y.o. (DOB 09-15-1940) female.  Chief Complaint:  Chief Complaint  Patient presents with  . Rectal Pain    HPI Nicole [name] is a 76 year old female with a history of a stage IIIA (T1b, N2, M0) non-small cell lung cancer, adenocarcinoma with a right lower lobe mass and right hilar and subcarinal lymphadenopathy dating to June 2018. She is status post completed chemotherapy with carboplatin and Taxol with concurrent radiation. She completed  30/30 fractions of radiation therapy on 09/19/2016. She began immunotherapy with Imfinzi on 10/20/2016. On 10/24/2016 she called the triage nurse for advice stating that she had not been eating well for several weeks. Her recent treatment with Imfinzi had only made this worse. She lost 3 pounds since her last visit. She was not able to tolerate Boost due to it being too sweet. She has been drinking around 64 ounces of fluids/broth/water per day. She has been having constipation since 10/18/2016 despite her use of one Colace each morning. She had been instructed to add MiraLAX. She was offered a visit for IV fluids but declined as she believed that she was adequately hydrated. Her constipation resolved this morning as she had a large hard stool. She continues to have a decreased appetite and moderate nausea. The patient had been instructed to use Zofran ODT. Ms. Kubota called yesterday and described a "lump" at her anal opening. She also stated that stool was coming around this lump and that there were blood clots mixed with her stool. She has been wearing pads due to stool frequency. She believed that her constipation had resolved. She reported that this "lump" was painful and hard. She is treating herself for hemorrhoid using sitz baths, Preparation H, and oxycodone. She had a large hard bowel movement this morning and passed a large round hard bolus stool. She states that the painful "lump" that she had been experiencing resolved immediately. She believes that this large painful hard "lump"  had been at this bolus stool. She had some bright red blood per rectum morning with the passage of this mass. She states that she is feeling much better. She reports having a diffuse petechial rash over the dorsal surfaces of her feet and a similar scattered rash on her bilateral upper extremities. Her labs today showed that her counts are stable with a platelet count of 280,000.  Constipation:  BODHI STENGLEIN reports  constipation.   Stool pattern has been hard firm stools despite her use of Colace. Onset was 9 days ago  Defecation has been difficult. Co-Morbid conditions:current medication opioid analgesics.  Symptoms have been waxing and waning but better overall.  Current Health Habits: Eating fiber? no  Exercise?no Water intake? yes, amt at least 64 ounces of water, broth, or non-caffeinated beverages daily. Current OTC/RX therapy has been Colace with the recent addition of MiraLAX which has been somewhat effective.  Medications: I have reviewed the patient's current medications.  Allergies:  Allergies  Allergen Reactions  . No Known Allergies     Past Medical History:  Diagnosis Date  . Adenocarcinoma of right lung, stage 3 ([place]) 07/28/2016  . Bronchitis    hx of  . Cancer [facility]) 2004   uterine/cervical  . Diabetes mellitus    type 2  . Gallstones   . Headache   . History of radiation therapy 08/09/2016 to 09/19/2016   Right lung was treated to 60 Gy in 30 fractions at 2 Gy per fraction  . Hypertension   . Low back pain   . Lung mass    with lymphadenopathy  . Osteoarthritis     Past Surgical History:  Procedure Laterality Date  . ABDOMINAL HYSTERECTOMY  2004  . APPENDECTOMY  when 76 years old  . COLONOSCOPY  08-05-09   [name]  . INCONTINENCE SURGERY    . KNEE ARTHROSCOPY Left   . LEFT HEART CATHETERIZATION WITH CORONARY ANGIOGRAM N/A 07/19/2013   Procedure: LEFT HEART CATHETERIZATION WITH CORONARY ANGIOGRAM;  Surgeon: Sinclair Grooms, MD;  Location: [facility] CATH LAB;  Service: Cardiovascular;  Laterality: N/A;  . POLYPECTOMY  08-05-09   2 polyps  . TOTAL KNEE ARTHROPLASTY Left 03/28/2012   Procedure: LEFT TOTAL KNEE ARTHROPLASTY;  Surgeon: Tobi Bastos, MD;  Location: [facility]S;  Service: Orthopedics;  Laterality: Left;  . TUBAL LIGATION    . VIDEO BRONCHOSCOPY WITH ENDOBRONCHIAL ULTRASOUND N/A 07/11/2016   Procedure: VIDEO BRONCHOSCOPY WITH ENDOBRONCHIAL ULTRASOUND;  Surgeon:  Marshell Garfinkel, MD;  Location: [place];  Service: Pulmonary;  Laterality: N/A;    Family History  Problem Relation Age of Onset  . Heart attack Father   . Heart disease Father   . Arthritis Other   . Cancer Other        colon, lst degree relative  . Diabetes Other        st degree relative  . Hyperlipidemia Other   . Hypertension Other   . Colon cancer Neg Hx     Social History   Social History  . Marital status: Married    Spouse name: N/A  . Number of children: N/A  . Years of education: N/A   Occupational History  . retired Retired   Social History Main Topics  . Smoking status: Never Smoker  . Smokeless tobacco: Never Used  . Alcohol use No  . Drug use: No  . Sexual activity: Not Currently   Other Topics Concern  . Not on file   Social  History Narrative   Regular exercise- Yes    Past Medical History, Surgical history, Social history, and Family history were reviewed and updated as appropriate.   Please see review of systems for further details on the patient's review from today.   Review of Systems:  Review of Systems  Constitutional: Positive for appetite change. Negative for chills, diaphoresis, fever and unexpected weight change.  Gastrointestinal: Positive for anal bleeding, constipation and rectal pain. Negative for abdominal distention, abdominal pain, blood in stool, diarrhea, nausea and vomiting.  Skin: Positive for rash.    Objective:   Physical Exam:  BP 121/78 (BP Location: Left Arm, Patient Position: Sitting)   Pulse 78   Temp 98.2 F (36.8 C) (Oral)   Resp 17   Ht 5' 8"  (1.727 m)   Wt 158 lb 9.6 oz (71.9 kg)   SpO2 98%   BMI 24.12 kg/m  ECOG: 1  Physical Exam  Constitutional: No distress.  HENT:  Head: Normocephalic and atraumatic.  Mouth/Throat: Oropharynx is clear and moist.  Cardiovascular: Normal rate, regular rhythm and normal heart sounds.  Exam reveals no gallop and no friction rub.   No murmur heard. Pulmonary/Chest:  Effort normal and breath sounds normal. No respiratory distress. She has no wheezes. She has no rales.  Abdominal: Soft. Bowel sounds are normal. She exhibits no distension and no mass. There is no tenderness. There is no rebound and no guarding.  Musculoskeletal: She exhibits no edema.  Neurological: She is alert.  Skin: Skin is warm and dry. Rash (Diffuse petechial rash over the dorsal surfaces of the feet bilaterally and over the anterior surfaces of the bilateral upper extremities.) noted. She is not diaphoretic.    Lab Review:     Component Value Date/Time   NA 136 10/27/2016 0817   K 3.7 10/27/2016 0817   CL 102 09/23/2016 1322   CO2 23 10/27/2016 0817   GLUCOSE 125 10/27/2016 0817   BUN 17.9 10/27/2016 0817   CREATININE 0.9 10/27/2016 0817   CALCIUM 9.3 10/27/2016 0817   PROT 7.0 10/27/2016 0817   ALBUMIN 3.1 (L) 10/27/2016 0817   AST 15 10/27/2016 0817   ALT 8 10/27/2016 0817   ALKPHOS 50 10/27/2016 0817   BILITOT 0.61 10/27/2016 0817   GFRNONAA 59 (L) 09/23/2016 1322   GFRAA >60 09/23/2016 1322       Component Value Date/Time   WBC 6.0 10/27/2016 0817   WBC 2.0 (L) 09/23/2016 1322   RBC 3.69 (L) 10/27/2016 0817   RBC 3.99 09/23/2016 1322   HGB 11.5 (L) 10/27/2016 0817   HCT 35.3 10/27/2016 0817   PLT 208 10/27/2016 0817   MCV 95.7 10/27/2016 0817   MCH 31.2 10/27/2016 0817   MCH 31.3 09/23/2016 1322   MCHC 32.6 10/27/2016 0817   MCHC 34.5 09/23/2016 1322   RDW 15.1 (H) 10/27/2016 0817   LYMPHSABS 0.6 (L) 10/27/2016 0817   MONOABS 0.6 10/27/2016 0817   EOSABS 0.1 10/27/2016 0817   BASOSABS 0.0 10/27/2016 0817   -------------------------------  Imaging from last 24 hours (if applicable):  Radiology interpretation: Ct Chest W Contrast  Result Date: 10/10/2016 CLINICAL DATA:  Lung cancer.  Cough. EXAM: CT CHEST WITH CONTRAST TECHNIQUE: Multidetector CT imaging of the chest was performed during intravenous contrast administration. CONTRAST:  75 cc Isovue 300  COMPARISON:  06/28/2016 FINDINGS: Cardiovascular: The heart size is normal. No pericardial effusion. Coronary artery calcification is evident. Atherosclerotic calcification is noted in the wall of the thoracic aorta. Mediastinum/Nodes:  High right paraesophageal lymph node towards the thoracic inlet has progressed in the interval measuring 8 mm short axis today (image 21 series 2) compared to 3 mm previously. Subcarinal lymph node measured 16 mm short axis on the prior study is 13 mm short axis today. Circumferential wall thickening noted mid esophagus (see image 67 series 2). No hilar lymphadenopathy No mediastinal lymphadenopathy. Lungs/Pleura: Posterior right lower lobe lesion smaller than prior measuring 2.4 x 1.3 cm today compared to 3.2 x 1.9 cm previously. 3 mm anterior left upper lobe pulmonary nodule is stable. No focal airspace consolidation. No pulmonary edema or pleural effusion. Upper Abdomen: The liver shows diffusely decreased attenuation suggesting steatosis. Laminar flow of un opacified blood noted in the portal vein. No adrenal nodule or mass. Musculoskeletal: Bone windows reveal no worrisome lytic or sclerotic osseous lesions. IMPRESSION: 1. High right paraesophageal lymph node has increased slightly in size in interval. Close attention on follow-up recommended. 2. Interval decrease in some parietal lymphadenopathy and associated interval decrease in size of the posterior right lower lobe lesion. 3. Interval development circumferential wall thickening midesophagus. Esophagitis would be a consideration. 4. Coronary artery and Aortic Atherosclerois (ICD10-170.0) Electronically Signed   By: Misty Stanley M.D.   On: 10/10/2016 13:53

## 2016-11-02 ENCOUNTER — Encounter: Payer: Self-pay | Admitting: Pulmonary Disease

## 2016-11-02 ENCOUNTER — Other Ambulatory Visit (HOSPITAL_BASED_OUTPATIENT_CLINIC_OR_DEPARTMENT_OTHER): Payer: Medicare Other

## 2016-11-02 ENCOUNTER — Ambulatory Visit (HOSPITAL_BASED_OUTPATIENT_CLINIC_OR_DEPARTMENT_OTHER): Payer: Medicare Other | Admitting: Oncology

## 2016-11-02 ENCOUNTER — Telehealth: Payer: Self-pay | Admitting: Internal Medicine

## 2016-11-02 ENCOUNTER — Ambulatory Visit (HOSPITAL_BASED_OUTPATIENT_CLINIC_OR_DEPARTMENT_OTHER): Payer: Medicare Other

## 2016-11-02 VITALS — BP 129/64 | HR 100 | Temp 98.6°F | Resp 18 | Ht 68.0 in | Wt 159.1 lb

## 2016-11-02 DIAGNOSIS — C3491 Malignant neoplasm of unspecified part of right bronchus or lung: Secondary | ICD-10-CM

## 2016-11-02 DIAGNOSIS — Z5112 Encounter for antineoplastic immunotherapy: Secondary | ICD-10-CM

## 2016-11-02 DIAGNOSIS — C3431 Malignant neoplasm of lower lobe, right bronchus or lung: Secondary | ICD-10-CM | POA: Diagnosis not present

## 2016-11-02 DIAGNOSIS — R1310 Dysphagia, unspecified: Secondary | ICD-10-CM

## 2016-11-02 DIAGNOSIS — R5382 Chronic fatigue, unspecified: Secondary | ICD-10-CM

## 2016-11-02 LAB — COMPREHENSIVE METABOLIC PANEL
ALT: 10 U/L (ref 0–55)
AST: 16 U/L (ref 5–34)
Albumin: 3 g/dL — ABNORMAL LOW (ref 3.5–5.0)
Alkaline Phosphatase: 48 U/L (ref 40–150)
Anion Gap: 9 mEq/L (ref 3–11)
BUN: 24.4 mg/dL (ref 7.0–26.0)
CO2: 23 mEq/L (ref 22–29)
Calcium: 8.7 mg/dL (ref 8.4–10.4)
Chloride: 107 mEq/L (ref 98–109)
Creatinine: 0.8 mg/dL (ref 0.6–1.1)
EGFR: >60 mL/min/{1.73_m2} (ref >60)
Glucose: 215 mg/dl — ABNORMAL HIGH (ref 70–140)
Potassium: 3.7 mEq/L (ref 3.5–5.1)
Sodium: 139 mEq/L (ref 136–145)
Total Bilirubin: 0.56 mg/dL (ref 0.20–1.20)
Total Protein: 6.5 g/dL (ref 6.4–8.3)

## 2016-11-02 LAB — CBC WITH DIFFERENTIAL/PLATELET
BASO%: 0.5 % (ref 0.0–2.0)
Basophils Absolute: 0 10*3/uL (ref 0.0–0.1)
EOS%: 2.7 % (ref 0.0–7.0)
Eosinophils Absolute: 0.1 10*3/uL (ref 0.0–0.5)
HCT: 35.2 % (ref 34.8–46.6)
HGB: 11.5 g/dL — ABNORMAL LOW (ref 11.6–15.9)
LYMPH%: 16.1 % (ref 14.0–49.7)
MCH: 30.8 pg (ref 25.1–34.0)
MCHC: 32.6 g/dL (ref 31.5–36.0)
MCV: 94.5 fL (ref 79.5–101.0)
MONO#: 0.4 10*3/uL (ref 0.1–0.9)
MONO%: 7.9 % (ref 0.0–14.0)
NEUT#: 3.3 10*3/uL (ref 1.5–6.5)
NEUT%: 72.8 % (ref 38.4–76.8)
Platelets: 255 10*3/uL (ref 145–400)
RBC: 3.73 10*6/uL (ref 3.70–5.45)
RDW: 16.5 % — ABNORMAL HIGH (ref 11.2–14.5)
WBC: 4.5 10*3/uL (ref 3.9–10.3)
lymph#: 0.7 10*3/uL — ABNORMAL LOW (ref 0.9–3.3)

## 2016-11-02 LAB — TSH: TSH: 0.09 m(IU)/L — ABNORMAL LOW (ref 0.308–3.960)

## 2016-11-02 MED ORDER — SODIUM CHLORIDE 0.9 % IV SOLN
10.0000 mg/kg | Freq: Once | INTRAVENOUS | Status: AC
  Administered 2016-11-02: 740 mg via INTRAVENOUS
  Filled 2016-11-02: qty 10

## 2016-11-02 MED ORDER — SODIUM CHLORIDE 0.9 % IV SOLN
Freq: Once | INTRAVENOUS | Status: AC
  Administered 2016-11-02: 16:00:00 via INTRAVENOUS

## 2016-11-02 NOTE — Patient Instructions (Signed)
[facility] Discharge Instructions for Patients Receiving Chemotherapy  Today you received the following chemotherapy agents: Imfinzi.  To help prevent nausea and vomiting after your treatment, we encourage you to take your nausea medication as directed.   If you develop nausea and vomiting that is not controlled by your nausea medication, call the clinic.   BELOW ARE SYMPTOMS THAT SHOULD BE REPORTED IMMEDIATELY:  *FEVER GREATER THAN 100.5 F  *CHILLS WITH OR WITHOUT FEVER  NAUSEA AND VOMITING THAT IS NOT CONTROLLED WITH YOUR NAUSEA MEDICATION  *UNUSUAL SHORTNESS OF BREATH  *UNUSUAL BRUISING OR BLEEDING  TENDERNESS IN MOUTH AND THROAT WITH OR WITHOUT PRESENCE OF ULCERS  *URINARY PROBLEMS  *BOWEL PROBLEMS  UNUSUAL RASH Items with * indicate a potential emergency and should be followed up as soon as possible.  Feel free to call the clinic should you have any questions or concerns. The clinic phone number is [phone].  Please show the CHEMO ALERT CARD at check-in to the Emergency Department and triage nurse.   

## 2016-11-02 NOTE — Telephone Encounter (Signed)
Appointments complete per 10/10 los. Patient will get print out in infusion. Per [place] ok for lab/fu/tx 10/25 due to no provider availability 10/24.

## 2016-11-02 NOTE — Progress Notes (Signed)
[facility] Follow up:    Nicole Lima, MD [address] [facility] [address] [place] [address]   DIAGNOSIS: Stage IIIa (T1b, N2, M0) non-small cell lung cancer, adenocarcinoma presented with right lower lobe lung mass in addition to right hilar and subcarinal lymphadenopathy diagnosed in June 2018.  Biomarker Findings Tumor Mutational Burden - TMB-Intermediate (8 Muts/Mb) Microsatellite Status - Cannot Be Determined Genomic Findings For a complete list of the genes assayed, please refer to the Appendix. EGFR E709K, G719S KTG2BW L893T DSK8J6 splice site 811-5B>W 7 Disease relevant genes with no reportable alterations: KRAS, ALK, BRAF, MET, ERBB2, RET, ROS1  PDL1 Expression70%.  SUMMARY OF ONCOLOGIC HISTORY:  No history exists.   PRIOR THERAPY:Concurrent chemoradiation with weekly carboplatin for AUC of 2 and paclitaxel 45 MG/M2. Status post 7cycleswith partial response.  CURRENT THERAPY: Consolidation treatment with immunotherapy with Imfinzi (Durvalumab) 10 MG/KG every 2 weeks, first dose 10/20/2016. Status post one cycle.  INTERVAL HISTORY: Nicole [name] 76 y.o. female returns for routine follow-up with her family member. The patient is feeling fine today. She was seen since her last visit for symptom management clinic for constipation. This is now resolved. She reports that she is taking a supplement called wild caught marine collagen peptides which was recommended by a family member. She is eating better and has gained a pound since her last visit. Denies fevers and chills. She denied having any chest pain, shortness of breath, cough or hemoptysis. She has no nausea, vomiting, diarrhea. He notices a rash on her arms which is not itching are bothering her. The patient is here for evaluation prior to cycle 2 of Imfinzi.    Patient Active Problem List   Diagnosis Date Noted  . Encounter for antineoplastic immunotherapy 10/12/2016  . Cancer-related pain  10/06/2016  . Atrial flutter ([place]) 10/05/2016  . Adenocarcinoma of right lung, stage 3 ([place]) 07/28/2016  . Encounter for antineoplastic chemotherapy 07/28/2016  . Goals of care, counseling/discussion 07/28/2016  . Pancreatic cyst 11/09/2015  . Estrogen deficiency 11/09/2015  . Routine general medical examination at a health care facility 06/30/2014  . Gout 01/18/2013  . Hypertriglyceridemia 08/22/2012  . Obesity (BMI 30-39.9) 08/22/2012  . Osteopenia 10/31/2011  . Type II diabetes mellitus with manifestations ([place]) 01/20/2009  . Essential hypertension 01/20/2009  . OSTEOARTHRITIS 01/20/2009    is allergic to no known allergies.  MEDICAL HISTORY: Past Medical History:  Diagnosis Date  . Adenocarcinoma of right lung, stage 3 ([place]) 07/28/2016  . Bronchitis    hx of  . Cancer [facility]) 2004   uterine/cervical  . Diabetes mellitus    type 2  . Gallstones   . Headache   . History of radiation therapy 08/09/2016 to 09/19/2016   Right lung was treated to 60 Gy in 30 fractions at 2 Gy per fraction  . Hypertension   . Low back pain   . Lung mass    with lymphadenopathy  . Osteoarthritis     SURGICAL HISTORY: Past Surgical History:  Procedure Laterality Date  . ABDOMINAL HYSTERECTOMY  2004  . APPENDECTOMY  when 76 years old  . COLONOSCOPY  08-05-09   [name]  . INCONTINENCE SURGERY    . KNEE ARTHROSCOPY Left   . LEFT HEART CATHETERIZATION WITH CORONARY ANGIOGRAM N/A 07/19/2013   Procedure: LEFT HEART CATHETERIZATION WITH CORONARY ANGIOGRAM;  Surgeon: Sinclair Grooms, MD;  Location: [facility] CATH LAB;  Service: Cardiovascular;  Laterality: N/A;  . POLYPECTOMY  08-05-09  2 polyps  . TOTAL KNEE ARTHROPLASTY Left 03/28/2012   Procedure: LEFT TOTAL KNEE ARTHROPLASTY;  Surgeon: Tobi Bastos, MD;  Location: [facility];  Service: Orthopedics;  Laterality: Left;  . TUBAL LIGATION    . VIDEO BRONCHOSCOPY WITH ENDOBRONCHIAL ULTRASOUND N/A 07/11/2016   Procedure: VIDEO BRONCHOSCOPY WITH  ENDOBRONCHIAL ULTRASOUND;  Surgeon: Marshell Garfinkel, MD;  Location: [place];  Service: Pulmonary;  Laterality: N/A;    SOCIAL HISTORY: Social History   Social History  . Marital status: Married    Spouse name: N/A  . Number of children: N/A  . Years of education: N/A   Occupational History  . retired Retired   Social History Main Topics  . Smoking status: Never Smoker  . Smokeless tobacco: Never Used  . Alcohol use No  . Drug use: No  . Sexual activity: Not Currently   Other Topics Concern  . Not on file   Social History Narrative   Regular exercise- Yes    FAMILY HISTORY: Family History  Problem Relation Age of Onset  . Heart attack Father   . Heart disease Father   . Arthritis Other   . Cancer Other        colon, lst degree relative  . Diabetes Other        st degree relative  . Hyperlipidemia Other   . Hypertension Other   . Colon cancer Neg Hx     Review of Systems  Constitutional: Negative.   HENT:  Negative.   Respiratory: Negative.   Cardiovascular: Negative.   Gastrointestinal: Negative.   Genitourinary: Negative.    Musculoskeletal: Negative.   Skin: Positive for rash. Negative for itching.  Neurological: Negative.   Hematological: Negative.   Psychiatric/Behavioral: Negative.       PHYSICAL EXAMINATION  ECOG PERFORMANCE STATUS: 1 - Symptomatic but completely ambulatory  Vitals:   11/02/16 1340  BP: 129/64  Pulse: 100  Resp: 18  Temp: 98.6 F (37 C)  SpO2: 99%    Physical Exam  Constitutional: She is oriented to person, place, and time and well-developed, well-nourished, and in no distress. No distress.  HENT:  Head: Normocephalic.  Mouth/Throat: Oropharynx is clear and moist. No oropharyngeal exudate.  Eyes: Conjunctivae are normal. Right eye exhibits no discharge. Left eye exhibits no discharge. No scleral icterus.  Neck: Normal range of motion. Neck supple.  Cardiovascular: Normal rate, regular rhythm, normal heart sounds and  intact distal pulses.   Pulmonary/Chest: Effort normal and breath sounds normal. No respiratory distress. She has no wheezes. She has no rales.  Abdominal: Soft. Bowel sounds are normal. She exhibits no distension and no mass. There is no tenderness.  Musculoskeletal: Normal range of motion. She exhibits no edema.  Lymphadenopathy:    She has no cervical adenopathy.  Neurological: She is alert and oriented to person, place, and time. She exhibits normal muscle tone. Gait normal. Coordination normal.  Skin: Skin is warm and dry. Rash noted. She is not diaphoretic. No erythema. No pallor.  Psychiatric: Mood, memory, affect and judgment normal.  Vitals reviewed.   LABORATORY DATA:  CBC    Component Value Date/Time   WBC 4.5 11/02/2016 1322   WBC 2.0 (L) 09/23/2016 1322   RBC 3.73 11/02/2016 1322   RBC 3.99 09/23/2016 1322   HGB 11.5 (L) 11/02/2016 1322   HCT 35.2 11/02/2016 1322   PLT 255 11/02/2016 1322   MCV 94.5 11/02/2016 1322   MCH 30.8 11/02/2016 1322   MCH 31.3 09/23/2016 1322  MCHC 32.6 11/02/2016 1322   MCHC 34.5 09/23/2016 1322   RDW 16.5 (H) 11/02/2016 1322   LYMPHSABS 0.7 (L) 11/02/2016 1322   MONOABS 0.4 11/02/2016 1322   EOSABS 0.1 11/02/2016 1322   BASOSABS 0.0 11/02/2016 1322    CMP     Component Value Date/Time   NA 139 11/02/2016 1322   K 3.7 11/02/2016 1322   CL 102 09/23/2016 1322   CO2 23 11/02/2016 1322   GLUCOSE 215 (H) 11/02/2016 1322   BUN 24.4 11/02/2016 1322   CREATININE 0.8 11/02/2016 1322   CALCIUM 8.7 11/02/2016 1322   PROT 6.5 11/02/2016 1322   ALBUMIN 3.0 (L) 11/02/2016 1322   AST 16 11/02/2016 1322   ALT 10 11/02/2016 1322   ALKPHOS 48 11/02/2016 1322   BILITOT 0.56 11/02/2016 1322   GFRNONAA 59 (L) 09/23/2016 1322   GFRAA >60 09/23/2016 1322    RADIOGRAPHIC STUDIES:  No results found.  ASSESSMENT and THERAPY PLAN:   Adenocarcinoma of right lung, stage 3 ([place]) This is a very pleasant 76 year old Asian female recently  diagnosed with a stage IIIa non-small cell lung cancer, adenocarcinoma. She underwent a course of concurrent chemoradiation with weekly carboplatin and paclitaxel status post 7cycles. The patient tolerated the previous course fairly well except for mild odynophagia and dry cough. She had repeat CT scan of the chest performed recently. Her scan showed improvement of her disease and the right lower lobe lung lesion as well as mediastinal lymphadenopathy but there was mild increase in high right paraesophageal lymph node which is currently nonspecific and not pathologic by size criteria but still suspicious and will need close follow-up on upcoming scan.  The patient is now on consolidation treatment with immunotherapy with Imfinzi (Durvalumab) 10 MG/KG every 2 weeks for a total of one year unless the patient developed toxicity or disease progression.   The patient was seen with Dr. Julien Nordmann. She tolerated her first treatment well with the exception of a mild rash. Recommend that she proceed with cycle 2 today as scheduled.  She may use hydrocortisone to the rash on her arms as needed.   She will come back for follow-up visit in 2 weeks for evaluation before starting cycle #3.  The patient was advised to call immediately if she has any concerning symptoms in the interval. The patient voices understanding of current disease status and treatment options and is in agreement with the current care plan. All questions were answered. The patient knows to call the clinic with any problems, questions or concerns. We can certainly see the patient much sooner if necessary.    No orders of the defined types were placed in this encounter.   All questions were answered. The patient knows to call the clinic with any problems, questions or concerns. We can certainly see the patient much sooner if necessary.  Mikey Bussing, NP 11/03/2016   ADDENDUM: Hematology/Oncology Attending: I had a face to face  encounter with the patient. I recommended her care plan. This is a very pleasant 76 years old Asian female with stage IIIA non-small cell lung cancer, adenocarcinoma status post a course of concurrent chemoradiation. She has rough time with this treatment with significant odynophagia and dysphagia but she is currently recovering slowly from this adverse effects. She also had issues with constipation and hemorrhoids last week but this is significantly improved. The patient was started 2 weeks ago on consolidation immunotherapy with Imfinzi (Durvalumab) status post 1 cycle. She is feeling much better  today. I recommended for her to proceed with cycle #2 of Imfinzi (Durvalumab) today as scheduled. I would see her back for follow-up visit in 2 weeks for evaluation with the start of cycle #3. The patient has plan to travel to [place] in November 2018 and we will work around her treatment as scheduled to allow her ample time for her travel and stay with the family there. She was advised to call immediately if she has any concerning symptoms in the interval.  Disclaimer: This note was dictated with voice recognition software. Similar sounding words can inadvertently be transcribed and may be missed upon review. Eilleen Kempf, MD 11/03/16

## 2016-11-03 ENCOUNTER — Encounter: Payer: Self-pay | Admitting: Oncology

## 2016-11-03 NOTE — Assessment & Plan Note (Addendum)
This is a very pleasant 77 year old Asian female recently diagnosed with a stage IIIa non-small cell lung cancer, adenocarcinoma. She underwent a course of concurrent chemoradiation with weekly carboplatin and paclitaxel status post 7cycles. The patient tolerated the previous course fairly well except for mild odynophagia and dry cough. She had repeat CT scan of the chest performed recently. Her scan showed improvement of her disease and the right lower lobe lung lesion as well as mediastinal lymphadenopathy but there was mild increase in high right paraesophageal lymph node which is currently nonspecific and not pathologic by size criteria but still suspicious and will need close follow-up on upcoming scan.  The patient is now on consolidation treatment with immunotherapy with Imfinzi (Durvalumab) 10 MG/KG every 2 weeks for a total of one year unless the patient developed toxicity or disease progression.   The patient was seen with Dr. Julien Nordmann. She tolerated her first treatment well with the exception of a mild rash. Recommend that she proceed with cycle 2 today as scheduled.  She may use hydrocortisone to the rash on her arms as needed.   She will come back for follow-up visit in 2 weeks for evaluation before starting cycle #3.  The patient was advised to call immediately if she has any concerning symptoms in the interval. The patient voices understanding of current disease status and treatment options and is in agreement with the current care plan. All questions were answered. The patient knows to call the clinic with any problems, questions or concerns. We can certainly see the patient much sooner if necessary.

## 2016-11-07 ENCOUNTER — Ambulatory Visit: Payer: Self-pay | Admitting: Radiation Oncology

## 2016-11-15 ENCOUNTER — Ambulatory Visit (INDEPENDENT_AMBULATORY_CARE_PROVIDER_SITE_OTHER): Payer: Medicare Other | Admitting: Internal Medicine

## 2016-11-15 ENCOUNTER — Encounter: Payer: Self-pay | Admitting: Internal Medicine

## 2016-11-15 VITALS — BP 144/90 | HR 94 | Temp 98.1°F | Resp 16 | Ht 68.0 in | Wt 166.0 lb

## 2016-11-15 DIAGNOSIS — I10 Essential (primary) hypertension: Secondary | ICD-10-CM

## 2016-11-15 DIAGNOSIS — I483 Typical atrial flutter: Secondary | ICD-10-CM

## 2016-11-15 NOTE — Patient Instructions (Signed)

## 2016-11-15 NOTE — Progress Notes (Signed)
Subjective:  Patient ID: Nicole [name], female    DOB: October 13, 1940  Age: 76 y.o. MRN: [id number]  CC: Hypertension   HPI Nicole [name] presents for a blood pressure check in the setting of a recent history of atrial flutter.  She is doing well on diltiazem.  She has had no recent episodes of palpitations, dizziness, lightheadedness, chest pain, shortness of breath, or edema.  Outpatient Medications Prior to Visit  Medication Sig Dispense Refill  . diltiazem (CARDIZEM CD) 120 MG 24 hr capsule Take 1 capsule (120 mg total) by mouth daily. 30 capsule 11  . ELIQUIS 5 MG TABS tablet take 1 tablet by mouth twice a day 60 tablet 10  . Multiple Vitamins-Minerals (MULTIVITAMIN WITH MINERALS) tablet Take 1 tablet by mouth daily.    [name] FREESTYLE LITE test strip TEST once daily as directed 100 each 11  . lidocaine (XYLOCAINE) 2 % solution Use as directed 10 mLs in the mouth or throat every 6 (six) hours as needed for mouth pain. Swish and swallow as needed for esophagitis 100 mL 2  . magic mouthwash SOLN Take 5 mLs by mouth 4 (four) times daily as needed for mouth pain. Use for throat pain also. (Patient not taking: Reported on 10/12/2016) 250 mL 1  . mometasone-formoterol (DULERA) 100-5 MCG/ACT AERO Inhale 2 puffs into the lungs 2 times daily at 12 noon and 4 pm. (Patient not taking: Reported on 10/18/2016) 1 Inhaler 1  . ondansetron (ZOFRAN-ODT) 4 MG disintegrating tablet Take 1 tablet (4 mg total) by mouth every 8 (eight) hours as needed for nausea or vomiting. 20 tablet 2  . oxyCODONE-acetaminophen (PERCOCET/ROXICET) 5-325 MG tablet Take 1 tablet by mouth every 8 (eight) hours as needed for severe pain. 60 tablet 0  . prochlorperazine (COMPAZINE) 10 MG tablet take 1 tablet by mouth every 6 hours if needed for nausea and vomiting 30 tablet 0  . sucralfate (CARAFATE) 1 g tablet Take 1 tablet (1 g total) by mouth 4 (four) times daily -  with meals and at bedtime. Dissolve tablet in 10-15 ml of water  before taking. (Patient not taking: Reported on 10/18/2016) 120 tablet 1  . Turmeric 450 MG CAPS Take 900 mg by mouth daily.      No facility-administered medications prior to visit.     ROS Review of Systems  Constitutional: Negative.  Negative for diaphoresis, fatigue and unexpected weight change.  HENT: Negative.  Negative for trouble swallowing.   Eyes: Negative for visual disturbance.  Respiratory: Negative.  Negative for cough, chest tightness, shortness of breath and wheezing.   Cardiovascular: Negative for chest pain, palpitations and leg swelling.  Gastrointestinal: Negative for abdominal pain, constipation, diarrhea, nausea and vomiting.  Endocrine: Negative.   Genitourinary: Negative.  Negative for difficulty urinating.  Musculoskeletal: Negative.   Skin: Negative.   Allergic/Immunologic: Negative.   Neurological: Negative.  Negative for dizziness, weakness and light-headedness.  Hematological: Negative for adenopathy. Does not bruise/bleed easily.  Psychiatric/Behavioral: Negative.     Objective:  BP (!) 144/90 (BP Location: Left Arm, Patient Position: Sitting, Cuff Size: Normal)   Pulse 94   Temp 98.1 F (36.7 C) (Oral)   Resp 16   Ht 5\' 8"  (1.727 m)   Wt 166 lb (75.3 kg)   SpO2 98%   BMI 25.24 kg/m   BP Readings from Last 3 Encounters:  11/17/16 (!) 152/85  11/15/16 (!) 144/90  11/02/16 129/64    Wt Readings from Last 3 Encounters:  11/17/16 167 lb 8 oz (76 kg)  11/15/16 166 lb (75.3 kg)  11/02/16 159 lb 1.6 oz (72.2 kg)    Physical Exam  Constitutional: She is oriented to person, place, and time. No distress.  HENT:  Mouth/Throat: Oropharynx is clear and moist. No oropharyngeal exudate.  Eyes: Conjunctivae are normal. Right eye exhibits no discharge. Left eye exhibits no discharge. No scleral icterus.  Neck: Normal range of motion. Neck supple. No JVD present. No thyromegaly present.  Cardiovascular: Normal rate, regular rhythm and intact distal  pulses.  Exam reveals no gallop and no friction rub.   No murmur heard. Pulmonary/Chest: Effort normal and breath sounds normal. No respiratory distress. She has no wheezes. She has no rales. She exhibits no tenderness.  Abdominal: Soft. Bowel sounds are normal. She exhibits no distension and no mass. There is no tenderness. There is no rebound and no guarding.  Musculoskeletal: Normal range of motion. She exhibits no edema, tenderness or deformity.  Lymphadenopathy:    She has no cervical adenopathy.  Neurological: She is alert and oriented to person, place, and time.  Skin: Skin is warm and dry. No rash noted. She is not diaphoretic. No erythema. No pallor.  Vitals reviewed.   Lab Results  Component Value Date   WBC 3.8 (L) 11/17/2016   HGB 12.5 11/17/2016   HCT 37.6 11/17/2016   PLT 188 11/17/2016   GLUCOSE 147 (H) 11/17/2016   CHOL 176 06/14/2016   TRIG (H) 06/14/2016    460.0 Triglyceride is over 400; calculations on Lipids are invalid.   HDL 40.70 06/14/2016   LDLDIRECT 74.0 06/14/2016   LDLCALC 89 10/31/2011   ALT 13 11/17/2016   AST 18 11/17/2016   NA 139 11/17/2016   K 3.8 11/17/2016   CL 102 09/23/2016   CREATININE 0.9 11/17/2016   BUN 24.6 11/17/2016   CO2 24 11/17/2016   TSH 0.090 (L) 11/02/2016   INR 1.0 07/06/2016   HGBA1C 7.4 09/20/2016   MICROALBUR 4.0 (H) 06/14/2016    No results found.  Assessment & Plan:   Nicole [name] was seen today for hypertension.  Diagnoses and all orders for this visit:  Typical atrial flutter ([place])- she has good rate and rhythm control.  Will continue diltiazem and the NOAC.  Essential hypertension- her blood pressure is adequately well controlled.   I have discontinued Ms. Nicole [name]'s Turmeric, FREESTYLE LITE, mometasone-formoterol, magic mouthwash, oxyCODONE-acetaminophen, prochlorperazine, sucralfate, lidocaine, and ondansetron. I am also having her maintain her multivitamin with minerals, diltiazem, ELIQUIS, Lactobacillus  (PROBIOTIC ACIDOPHILUS PO), and COLLAGEN PO.  Meds ordered this encounter  Medications  . Lactobacillus (PROBIOTIC ACIDOPHILUS PO)    Sig: Take by mouth.  . COLLAGEN PO    Sig: Take by mouth.     Follow-up: Return in about 6 months (around 05/16/2017).  Scarlette Calico, MD

## 2016-11-16 ENCOUNTER — Other Ambulatory Visit: Payer: Medicare Other

## 2016-11-16 ENCOUNTER — Ambulatory Visit: Payer: Medicare Other

## 2016-11-17 ENCOUNTER — Ambulatory Visit: Payer: Medicare Other | Admitting: Nutrition

## 2016-11-17 ENCOUNTER — Other Ambulatory Visit (HOSPITAL_BASED_OUTPATIENT_CLINIC_OR_DEPARTMENT_OTHER): Payer: Medicare Other

## 2016-11-17 ENCOUNTER — Ambulatory Visit (HOSPITAL_BASED_OUTPATIENT_CLINIC_OR_DEPARTMENT_OTHER): Payer: Medicare Other | Admitting: Oncology

## 2016-11-17 ENCOUNTER — Encounter: Payer: Self-pay | Admitting: Oncology

## 2016-11-17 ENCOUNTER — Ambulatory Visit (HOSPITAL_BASED_OUTPATIENT_CLINIC_OR_DEPARTMENT_OTHER): Payer: Medicare Other

## 2016-11-17 VITALS — BP 152/85 | HR 98 | Temp 98.5°F | Resp 17 | Ht 68.0 in | Wt 167.5 lb

## 2016-11-17 DIAGNOSIS — C3431 Malignant neoplasm of lower lobe, right bronchus or lung: Secondary | ICD-10-CM | POA: Diagnosis not present

## 2016-11-17 DIAGNOSIS — I10 Essential (primary) hypertension: Secondary | ICD-10-CM | POA: Diagnosis not present

## 2016-11-17 DIAGNOSIS — Z5112 Encounter for antineoplastic immunotherapy: Secondary | ICD-10-CM

## 2016-11-17 DIAGNOSIS — R5382 Chronic fatigue, unspecified: Secondary | ICD-10-CM

## 2016-11-17 DIAGNOSIS — C3491 Malignant neoplasm of unspecified part of right bronchus or lung: Secondary | ICD-10-CM

## 2016-11-17 DIAGNOSIS — M8588 Other specified disorders of bone density and structure, other site: Secondary | ICD-10-CM

## 2016-11-17 LAB — COMPREHENSIVE METABOLIC PANEL
ALT: 13 U/L (ref 0–55)
AST: 18 U/L (ref 5–34)
Albumin: 3.3 g/dL — ABNORMAL LOW (ref 3.5–5.0)
Alkaline Phosphatase: 59 U/L (ref 40–150)
Anion Gap: 8 mEq/L (ref 3–11)
BUN: 24.6 mg/dL (ref 7.0–26.0)
CO2: 24 mEq/L (ref 22–29)
Calcium: 9.1 mg/dL (ref 8.4–10.4)
Chloride: 106 mEq/L (ref 98–109)
Creatinine: 0.9 mg/dL (ref 0.6–1.1)
EGFR: >60 mL/min/{1.73_m2} (ref >60)
Glucose: 147 mg/dl — ABNORMAL HIGH (ref 70–140)
Potassium: 3.8 mEq/L (ref 3.5–5.1)
Sodium: 139 mEq/L (ref 136–145)
Total Bilirubin: 0.56 mg/dL (ref 0.20–1.20)
Total Protein: 7 g/dL (ref 6.4–8.3)

## 2016-11-17 LAB — CBC WITH DIFFERENTIAL/PLATELET
BASO%: 0.5 % (ref 0.0–2.0)
Basophils Absolute: 0 10*3/uL (ref 0.0–0.1)
EOS%: 3.3 % (ref 0.0–7.0)
Eosinophils Absolute: 0.1 10*3/uL (ref 0.0–0.5)
HCT: 37.6 % (ref 34.8–46.6)
HGB: 12.5 g/dL (ref 11.6–15.9)
LYMPH%: 15.7 % (ref 14.0–49.7)
MCH: 31.5 pg (ref 25.1–34.0)
MCHC: 33.3 g/dL (ref 31.5–36.0)
MCV: 94.6 fL (ref 79.5–101.0)
MONO#: 0.3 10*3/uL (ref 0.1–0.9)
MONO%: 9 % (ref 0.0–14.0)
NEUT#: 2.7 10*3/uL (ref 1.5–6.5)
NEUT%: 71.5 % (ref 38.4–76.8)
Platelets: 188 10*3/uL (ref 145–400)
RBC: 3.98 10*6/uL (ref 3.70–5.45)
RDW: 15.8 % — ABNORMAL HIGH (ref 11.2–14.5)
WBC: 3.8 10*3/uL — ABNORMAL LOW (ref 3.9–10.3)
lymph#: 0.6 10*3/uL — ABNORMAL LOW (ref 0.9–3.3)

## 2016-11-17 MED ORDER — SODIUM CHLORIDE 0.9 % IV SOLN
Freq: Once | INTRAVENOUS | Status: AC
  Administered 2016-11-17: 14:00:00 via INTRAVENOUS

## 2016-11-17 MED ORDER — SODIUM CHLORIDE 0.9 % IV SOLN
10.0000 mg/kg | Freq: Once | INTRAVENOUS | Status: AC
  Administered 2016-11-17: 740 mg via INTRAVENOUS
  Filled 2016-11-17: qty 4.8

## 2016-11-17 NOTE — Patient Instructions (Signed)
[facility] Discharge Instructions for Patients Receiving Chemotherapy  Today you received the following chemotherapy agents: Imfinzi.  To help prevent nausea and vomiting after your treatment, we encourage you to take your nausea medication as directed.   If you develop nausea and vomiting that is not controlled by your nausea medication, call the clinic.   BELOW ARE SYMPTOMS THAT SHOULD BE REPORTED IMMEDIATELY:  *FEVER GREATER THAN 100.5 F  *CHILLS WITH OR WITHOUT FEVER  NAUSEA AND VOMITING THAT IS NOT CONTROLLED WITH YOUR NAUSEA MEDICATION  *UNUSUAL SHORTNESS OF BREATH  *UNUSUAL BRUISING OR BLEEDING  TENDERNESS IN MOUTH AND THROAT WITH OR WITHOUT PRESENCE OF ULCERS  *URINARY PROBLEMS  *BOWEL PROBLEMS  UNUSUAL RASH Items with * indicate a potential emergency and should be followed up as soon as possible.  Feel free to call the clinic should you have any questions or concerns. The clinic phone number is [phone].  Please show the CHEMO ALERT CARD at check-in to the Emergency Department and triage nurse.   

## 2016-11-17 NOTE — Progress Notes (Signed)
[facility] Follow up:    Nicole Lima, MD [address] [facility] [address] [place] [address]   DIAGNOSIS: Stage IIIa (T1b, N2, M0) non-small cell lung cancer, adenocarcinoma presented with right lower lobe lung mass in addition to right hilar and subcarinal lymphadenopathy diagnosed in June 2018.  Biomarker Findings Tumor Mutational Burden - TMB-Intermediate (8 Muts/Mb) Microsatellite Status - Cannot Be Determined Genomic Findings For a complete list of the genes assayed, please refer to the Appendix. EGFR E709K, G719S OFB5ZW C585I DPO2U2 splice site [id number]>W 7 Disease relevant genes with no reportable alterations: KRAS, ALK, BRAF, MET, ERBB2, RET, ROS1  PDL1 Expression70%.  SUMMARY OF ONCOLOGIC HISTORY:  No history exists.   PRIOR THERAPY:Concurrent chemoradiation with weekly carboplatin for AUC of 2 and paclitaxel 45 MG/M2. Status post 7cycleswith partial response.  CURRENT THERAPY: Consolidation treatment with immunotherapy with Imfinzi (Durvalumab) 10 MG/KG every 2 weeks, first dose 10/20/2016. Status post two cycles.  INTERVAL HISTORY: Nicole [name] 76 y.o. female returns for routine follow-up with her family member. The patient is feeling fine today. She reports that she develops a rash to her face for about 10 days following her treatment. She was hydrocortisone on it. She is eating better and has gained a pound since her last visit. Denies fevers and chills. She denied having any chest pain, shortness of breath, cough or hemoptysis. She has no nausea, vomiting, diarrhea. The patient will be going to [place] in November and requested not to have any treatments in November. She hopes to resume the first week of December. The patient is here for evaluation prior to cycle 3 of Imfinzi.    Patient Active Problem List   Diagnosis Date Noted  . Encounter for antineoplastic immunotherapy 10/12/2016  . Cancer-related pain 10/06/2016  . Atrial  flutter ([place]) 10/05/2016  . Adenocarcinoma of right lung, stage 3 ([place]) 07/28/2016  . Encounter for antineoplastic chemotherapy 07/28/2016  . Goals of care, counseling/discussion 07/28/2016  . Pancreatic cyst 11/09/2015  . Estrogen deficiency 11/09/2015  . Routine general medical examination at a health care facility 06/30/2014  . Gout 01/18/2013  . Hypertriglyceridemia 08/22/2012  . Osteopenia 10/31/2011  . Type II diabetes mellitus with manifestations ([place]) 01/20/2009  . Essential hypertension 01/20/2009  . OSTEOARTHRITIS 01/20/2009    is allergic to no known allergies.  MEDICAL HISTORY: Past Medical History:  Diagnosis Date  . Adenocarcinoma of right lung, stage 3 ([place]) 07/28/2016  . Bronchitis    hx of  . Cancer [facility]) 2004   uterine/cervical  . Diabetes mellitus    type 2  . Gallstones   . Headache   . History of radiation therapy 08/09/2016 to 09/19/2016   Right lung was treated to 60 Gy in 30 fractions at 2 Gy per fraction  . Hypertension   . Low back pain   . Lung mass    with lymphadenopathy  . Osteoarthritis     SURGICAL HISTORY: Past Surgical History:  Procedure Laterality Date  . ABDOMINAL HYSTERECTOMY  2004  . APPENDECTOMY  when 76 years old  . COLONOSCOPY  08-05-09   [name]  . INCONTINENCE SURGERY    . KNEE ARTHROSCOPY Left   . LEFT HEART CATHETERIZATION WITH CORONARY ANGIOGRAM N/A 07/19/2013   Procedure: LEFT HEART CATHETERIZATION WITH CORONARY ANGIOGRAM;  Surgeon: Sinclair Grooms, MD;  Location: [facility] CATH LAB;  Service: Cardiovascular;  Laterality: N/A;  . POLYPECTOMY  08-05-09   2 polyps  . TOTAL KNEE ARTHROPLASTY  Left 03/28/2012   Procedure: LEFT TOTAL KNEE ARTHROPLASTY;  Surgeon: Tobi Bastos, MD;  Location: [facility] ORS;  Service: Orthopedics;  Laterality: Left;  . TUBAL LIGATION    . VIDEO BRONCHOSCOPY WITH ENDOBRONCHIAL ULTRASOUND N/A 07/11/2016   Procedure: VIDEO BRONCHOSCOPY WITH ENDOBRONCHIAL ULTRASOUND;  Surgeon: Marshell Garfinkel, MD;  Location:  [place];  Service: Pulmonary;  Laterality: N/A;    SOCIAL HISTORY: Social History   Social History  . Marital status: Married    Spouse name: N/A  . Number of children: N/A  . Years of education: N/A   Occupational History  . retired Retired   Social History Main Topics  . Smoking status: Never Smoker  . Smokeless tobacco: Never Used  . Alcohol use No  . Drug use: No  . Sexual activity: Not Currently   Other Topics Concern  . Not on file   Social History Narrative   Regular exercise- Yes    FAMILY HISTORY: Family History  Problem Relation Age of Onset  . Heart attack Father   . Heart disease Father   . Arthritis Other   . Cancer Other        colon, lst degree relative  . Diabetes Other        st degree relative  . Hyperlipidemia Other   . Hypertension Other   . Colon cancer Neg Hx     Review of Systems  Constitutional: Negative.   HENT:  Negative.   Eyes: Negative.   Respiratory: Negative.   Cardiovascular: Negative.   Gastrointestinal: Negative.   Genitourinary: Negative.    Musculoskeletal: Negative.   Skin: Positive for rash.  Neurological: Negative.   Hematological: Negative.   Psychiatric/Behavioral: Negative.       PHYSICAL EXAMINATION  ECOG PERFORMANCE STATUS: 1 - Symptomatic but completely ambulatory  Vitals:   11/17/16 1324  BP: (!) 152/85  Pulse: 98  Resp: 17  Temp: 98.5 F (36.9 C)  SpO2: 100%    Physical Exam  Constitutional: She is oriented to person, place, and time and well-developed, well-nourished, and in no distress. No distress.  HENT:  Head: Normocephalic.  Mouth/Throat: Oropharynx is clear and moist. No oropharyngeal exudate.  Eyes: Conjunctivae are normal. Right eye exhibits no discharge. Left eye exhibits no discharge. No scleral icterus.  Neck: Normal range of motion. Neck supple.  Cardiovascular: Normal rate, regular rhythm, normal heart sounds and intact distal pulses.   Pulmonary/Chest: Effort normal and breath  sounds normal. No respiratory distress. She has no wheezes. She has no rales.  Abdominal: Soft. Bowel sounds are normal. She exhibits no distension and no mass. There is no tenderness.  Musculoskeletal: Normal range of motion. She exhibits no edema.  Lymphadenopathy:    She has no cervical adenopathy.  Neurological: She is alert and oriented to person, place, and time. She exhibits normal muscle tone. Gait normal. Coordination normal.  Skin: Skin is warm and dry. No rash noted. She is not diaphoretic. No erythema. No pallor.  Psychiatric: Mood, memory, affect and judgment normal.  Vitals reviewed.   LABORATORY DATA:  CBC    Component Value Date/Time   WBC 3.8 (L) 11/17/2016 1208   WBC 2.0 (L) 09/23/2016 1322   RBC 3.98 11/17/2016 1208   RBC 3.99 09/23/2016 1322   HGB 12.5 11/17/2016 1208   HCT 37.6 11/17/2016 1208   PLT 188 11/17/2016 1208   MCV 94.6 11/17/2016 1208   MCH 31.5 11/17/2016 1208   MCH 31.3 09/23/2016 1322   MCHC 33.3  11/17/2016 1208   MCHC 34.5 09/23/2016 1322   RDW 15.8 (H) 11/17/2016 1208   LYMPHSABS 0.6 (L) 11/17/2016 1208   MONOABS 0.3 11/17/2016 1208   EOSABS 0.1 11/17/2016 1208   BASOSABS 0.0 11/17/2016 1208    CMP     Component Value Date/Time   NA 139 11/17/2016 1208   K 3.8 11/17/2016 1208   CL 102 09/23/2016 1322   CO2 24 11/17/2016 1208   GLUCOSE 147 (H) 11/17/2016 1208   BUN 24.6 11/17/2016 1208   CREATININE 0.9 11/17/2016 1208   CALCIUM 9.1 11/17/2016 1208   PROT 7.0 11/17/2016 1208   ALBUMIN 3.3 (L) 11/17/2016 1208   AST 18 11/17/2016 1208   ALT 13 11/17/2016 1208   ALKPHOS 59 11/17/2016 1208   BILITOT 0.56 11/17/2016 1208   GFRNONAA 59 (L) 09/23/2016 1322   GFRAA >60 09/23/2016 1322    RADIOGRAPHIC STUDIES:  No results found.  ASSESSMENT and THERAPY PLAN:   Adenocarcinoma of right lung, stage 3 ([place]) This is a very pleasant 76 year old Asian female recently diagnosed with a stage IIIa non-small cell lung cancer,  adenocarcinoma. She underwent a course of concurrent chemoradiation with weekly carboplatin and paclitaxel status post 7cycles. The patient tolerated the previous course fairly well except for mild odynophagia and dry cough. She had repeat CT scan of the chest performed recently. Her scan showed improvement of her disease and the right lower lobe lung lesion as well as mediastinal lymphadenopathy but there was mild increase in high right paraesophageal lymph node which is currently nonspecific and not pathologic by size criteria but still suspicious and will need close follow-up on upcoming scan.  The patient is now on consolidation treatment with immunotherapy with Imfinzi (Durvalumab) 10 MG/KG every 2 weeks for a total of one year unless the patient developed toxicity or disease progression.   She continues to tolerate her treatment well with the exception of a mild rash. Recommend that she proceed with cycle 3 today as scheduled.  She may use hydrocortisone as needed.   I have canceled the patient's appointments for November per her request. I have requested follow-up the first week of December 2018 to evaluate her prior to cycle 4 of her treatment.  The patient was advised to call immediately if she has any concerning symptoms in the interval. The patient voices understanding of current disease status and treatment options and is in agreement with the current care plan. All questions were answered. The patient knows to call the clinic with any problems, questions or concerns. We can certainly see the patient much sooner if necessary.   No orders of the defined types were placed in this encounter.   All questions were answered. The patient knows to call the clinic with any problems, questions or concerns. We can certainly see the patient much sooner if necessary.  Mikey Bussing, NP 11/18/2016

## 2016-11-17 NOTE — Progress Notes (Signed)
Patient was identified to be at risk for malnutrition on the MST secondary to poor appetite and weight loss.  Patient is a 76 year old female diagnosed with non-small cell lung cancer.  Past medical history includes gout, diabetes, and hypertension.  Medications include multivitamin, Zofran, Magic mouthwash, Compazine, and Carafate.  Labs include glucose 147, albumin 3.3.  Height 68 inches.   Weight 167.5 pounds. BMI: 25.47.  Patient reports appetite has much improved. Oral intake has also improved. Patient denies nausea, vomiting, constipation, and diarrhea. Patient has been taking collagen and attributes her increased appetite to using collagen over the last several weeks. No nutrition diagnosis at this time. Provided my contact information in case patient has questions or concerns in the future.  **Disclaimer: This note was dictated with voice recognition software. Similar sounding words can inadvertently be transcribed and this note may contain transcription errors which may not have been corrected upon publication of note.**

## 2016-11-18 NOTE — Assessment & Plan Note (Signed)
This is a very pleasant 75 year old Asian female recently diagnosed with a stage IIIa non-small cell lung cancer, adenocarcinoma. She underwent a course of concurrent chemoradiation with weekly carboplatin and paclitaxel status post 7cycles. The patient tolerated the previous course fairly well except for mild odynophagia and dry cough. She had repeat CT scan of the chest performed recently. Her scan showed improvement of her disease and the right lower lobe lung lesion as well as mediastinal lymphadenopathy but there was mild increase in high right paraesophageal lymph node which is currently nonspecific and not pathologic by size criteria but still suspicious and will need close follow-up on upcoming scan.  The patient is now on consolidation treatment with immunotherapy with Imfinzi (Durvalumab) 10 MG/KG every 2 weeks for a total of one year unless the patient developed toxicity or disease progression.   She continues to tolerate her treatment well with the exception of a mild rash. Recommend that she proceed with cycle 3 today as scheduled.  She may use hydrocortisone as needed.   I have canceled the patient's appointments for November per her request. I have requested follow-up the first week of December 2018 to evaluate her prior to cycle 4 of her treatment.  The patient was advised to call immediately if she has any concerning symptoms in the interval. The patient voices understanding of current disease status and treatment options and is in agreement with the current care plan. All questions were answered. The patient knows to call the clinic with any problems, questions or concerns. We can certainly see the patient much sooner if necessary.

## 2016-11-21 ENCOUNTER — Telehealth: Payer: Self-pay | Admitting: Oncology

## 2016-11-21 NOTE — Telephone Encounter (Signed)
Scheduled appt per 10/24 los - patient is aware of appt added and cancelled.

## 2016-11-30 ENCOUNTER — Other Ambulatory Visit: Payer: Medicare Other

## 2016-11-30 ENCOUNTER — Ambulatory Visit: Payer: Medicare Other | Admitting: Internal Medicine

## 2016-11-30 ENCOUNTER — Ambulatory Visit: Payer: Medicare Other

## 2016-12-14 ENCOUNTER — Ambulatory Visit: Payer: Medicare Other

## 2016-12-14 ENCOUNTER — Other Ambulatory Visit: Payer: Medicare Other

## 2016-12-14 ENCOUNTER — Ambulatory Visit: Payer: Medicare Other | Admitting: Internal Medicine

## 2016-12-28 ENCOUNTER — Encounter: Payer: Self-pay | Admitting: *Deleted

## 2016-12-29 ENCOUNTER — Telehealth: Payer: Self-pay | Admitting: Internal Medicine

## 2016-12-29 ENCOUNTER — Ambulatory Visit (HOSPITAL_BASED_OUTPATIENT_CLINIC_OR_DEPARTMENT_OTHER): Payer: Medicare Other | Admitting: Internal Medicine

## 2016-12-29 ENCOUNTER — Other Ambulatory Visit: Payer: Self-pay | Admitting: *Deleted

## 2016-12-29 ENCOUNTER — Ambulatory Visit (HOSPITAL_BASED_OUTPATIENT_CLINIC_OR_DEPARTMENT_OTHER): Payer: Medicare Other

## 2016-12-29 ENCOUNTER — Encounter: Payer: Self-pay | Admitting: Internal Medicine

## 2016-12-29 ENCOUNTER — Other Ambulatory Visit (HOSPITAL_BASED_OUTPATIENT_CLINIC_OR_DEPARTMENT_OTHER): Payer: Medicare Other

## 2016-12-29 VITALS — HR 97

## 2016-12-29 VITALS — BP 148/88 | HR 114 | Temp 98.0°F | Resp 20

## 2016-12-29 DIAGNOSIS — C3431 Malignant neoplasm of lower lobe, right bronchus or lung: Secondary | ICD-10-CM

## 2016-12-29 DIAGNOSIS — Z79899 Other long term (current) drug therapy: Secondary | ICD-10-CM

## 2016-12-29 DIAGNOSIS — C3491 Malignant neoplasm of unspecified part of right bronchus or lung: Secondary | ICD-10-CM

## 2016-12-29 DIAGNOSIS — Z5112 Encounter for antineoplastic immunotherapy: Secondary | ICD-10-CM | POA: Diagnosis not present

## 2016-12-29 DIAGNOSIS — R05 Cough: Secondary | ICD-10-CM

## 2016-12-29 DIAGNOSIS — R5382 Chronic fatigue, unspecified: Secondary | ICD-10-CM

## 2016-12-29 LAB — CBC WITH DIFFERENTIAL/PLATELET
BASO%: 0.2 % (ref 0.0–2.0)
Basophils Absolute: 0 10*3/uL (ref 0.0–0.1)
EOS%: 2.1 % (ref 0.0–7.0)
Eosinophils Absolute: 0.1 10*3/uL (ref 0.0–0.5)
HCT: 41.3 % (ref 34.8–46.6)
HGB: 13.4 g/dL (ref 11.6–15.9)
LYMPH%: 11.2 % — ABNORMAL LOW (ref 14.0–49.7)
MCH: 29.8 pg (ref 25.1–34.0)
MCHC: 32.4 g/dL (ref 31.5–36.0)
MCV: 92 fL (ref 79.5–101.0)
MONO#: 0.4 10*3/uL (ref 0.1–0.9)
MONO%: 8.2 % (ref 0.0–14.0)
NEUT#: 4.2 10*3/uL (ref 1.5–6.5)
NEUT%: 78.3 % — ABNORMAL HIGH (ref 38.4–76.8)
Platelets: 282 10*3/uL (ref 145–400)
RBC: 4.49 10*6/uL (ref 3.70–5.45)
RDW: 13.1 % (ref 11.2–14.5)
WBC: 5.3 10*3/uL (ref 3.9–10.3)
lymph#: 0.6 10*3/uL — ABNORMAL LOW (ref 0.9–3.3)

## 2016-12-29 LAB — COMPREHENSIVE METABOLIC PANEL
ALT: 11 U/L (ref 0–55)
AST: 14 U/L (ref 5–34)
Albumin: 3.1 g/dL — ABNORMAL LOW (ref 3.5–5.0)
Alkaline Phosphatase: 71 U/L (ref 40–150)
Anion Gap: 11 mEq/L (ref 3–11)
BUN: 24.9 mg/dL (ref 7.0–26.0)
CO2: 22 mEq/L (ref 22–29)
Calcium: 9.4 mg/dL (ref 8.4–10.4)
Chloride: 105 mEq/L (ref 98–109)
Creatinine: 1 mg/dL (ref 0.6–1.1)
EGFR: 59 mL/min/{1.73_m2} — ABNORMAL LOW (ref >60)
Glucose: 192 mg/dl — ABNORMAL HIGH (ref 70–140)
Potassium: 3.7 mEq/L (ref 3.5–5.1)
Sodium: 137 mEq/L (ref 136–145)
Total Bilirubin: 0.55 mg/dL (ref 0.20–1.20)
Total Protein: 7.7 g/dL (ref 6.4–8.3)

## 2016-12-29 LAB — TSH: TSH: 0.587 m(IU)/L (ref 0.308–3.960)

## 2016-12-29 MED ORDER — HYDROCODONE-HOMATROPINE 5-1.5 MG/5ML PO SYRP: 5.0000 mL | ORAL_SOLUTION | Freq: Four times a day (QID) | ORAL | 0 refills | Status: DC | PRN

## 2016-12-29 MED ORDER — SODIUM CHLORIDE 0.9 % IV SOLN
Freq: Once | INTRAVENOUS | Status: AC
  Administered 2016-12-29: 11:00:00 via INTRAVENOUS

## 2016-12-29 MED ORDER — SODIUM CHLORIDE 0.9 % IV SOLN
10.0000 mg/kg | Freq: Once | INTRAVENOUS | Status: AC
  Administered 2016-12-29: 740 mg via INTRAVENOUS
  Filled 2016-12-29: qty 10

## 2016-12-29 NOTE — Patient Instructions (Signed)
[facility] Discharge Instructions for Patients Receiving Chemotherapy  Today you received the following chemotherapy agents: Imfinzi.  To help prevent nausea and vomiting after your treatment, we encourage you to take your nausea medication as directed.   If you develop nausea and vomiting that is not controlled by your nausea medication, call the clinic.   BELOW ARE SYMPTOMS THAT SHOULD BE REPORTED IMMEDIATELY:  *FEVER GREATER THAN 100.5 F  *CHILLS WITH OR WITHOUT FEVER  NAUSEA AND VOMITING THAT IS NOT CONTROLLED WITH YOUR NAUSEA MEDICATION  *UNUSUAL SHORTNESS OF BREATH  *UNUSUAL BRUISING OR BLEEDING  TENDERNESS IN MOUTH AND THROAT WITH OR WITHOUT PRESENCE OF ULCERS  *URINARY PROBLEMS  *BOWEL PROBLEMS  UNUSUAL RASH Items with * indicate a potential emergency and should be followed up as soon as possible.  Feel free to call the clinic should you have any questions or concerns. The clinic phone number is [phone].  Please show the CHEMO ALERT CARD at check-in to the Emergency Department and triage nurse.   

## 2016-12-29 NOTE — Progress Notes (Signed)
[place] [phone]   [phone]) [phone]  OFFICE PROGRESS NOTE  [name], MD [address] [facility] [address] [place] [address]  DIAGNOSIS: Stage IIIa (T1b, N2, M0) non-small cell lung cancer, adenocarcinoma presented with right lower lobe lung mass in addition to right hilar and subcarinal lymphadenopathy diagnosed in June 2018.  Biomarker Findings Tumor Mutational Burden - TMB-Intermediate (8 Muts/Mb) Microsatellite Status - Cannot Be Determined Genomic Findings For a complete list of the genes assayed, please refer to the Appendix. EGFR E709K, G719S RCV8LF Y101B PZW2H8 splice site 527-7O>E 7 Disease relevant genes with no reportable alterations: KRAS, ALK, BRAF, MET, ERBB2, RET, ROS1  PDL1 Expression 70%.  PRIOR THERAPY:Concurrent chemoradiation with weekly carboplatin for AUC of 2 and paclitaxel 45 MG/M2. Status post 7 cycles with partial response.  CURRENT THERAPY: Consolidation treatment with immunotherapy with Imfinzi (Durvalumab) 10 MG/KG every 2 weeks, first dose 10/19/2016.  Status post 3 cycles.  INTERVAL HISTORY: Nicole [name] 76 y.o. female returns to the clinic today for follow-up visit.  The patient just came back from [place] few days ago.  She enjoyed her family gathering and celebrating her aunt 's 100% birthtoday.  The patient is feeling fine today with no specific complaints except for dry cough.  She denied having any current fever or chills.  She has no chest pain or hemoptysis.  She continues to have mild shortness of breath with exertion.  She denied having any nausea, vomiting, diarrhea or constipation.  She has no significant weight loss or night sweats.  The patient is here today for evaluation before resuming her treatment with immunotherapy.  MEDICAL HISTORY: Past Medical History:  Diagnosis Date  . Adenocarcinoma of right lung, stage 3 ([place]) 07/28/2016  . Bronchitis    hx of  . 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 Diabetes mellitus    type 2  . Gallstones   . Headache   . History of radiation therapy 08/09/2016 to 09/19/2016   Right lung was treated to 60 Gy in 30 fractions at 2 Gy per fraction  . Hypertension   . Low back pain   . Lung mass    with lymphadenopathy  . Osteoarthritis     ALLERGIES:  is allergic to no known allergies.  MEDICATIONS:  Current Outpatient Medications  Medication Sig Dispense Refill  . COLLAGEN PO Take by mouth.    . diltiazem (CARDIZEM CD) 120 MG 24 hr capsule Take 1 capsule (120 mg total) by mouth daily. 30 capsule 11  . ELIQUIS 5 MG TABS tablet take 1 tablet by mouth twice a day 60 tablet 10  . Lactobacillus (PROBIOTIC ACIDOPHILUS PO) Take by mouth.    . Multiple Vitamins-Minerals (MULTIVITAMIN WITH MINERALS) tablet Take 1 tablet by mouth daily.     No current facility-administered medications for this visit.     SURGICAL HISTORY:  Past Surgical History:  Procedure Laterality Date  . ABDOMINAL HYSTERECTOMY  2004  . APPENDECTOMY  when 76 years old  . COLONOSCOPY  08-05-09   [name]  . INCONTINENCE SURGERY    . KNEE ARTHROSCOPY Left   . LEFT HEART CATHETERIZATION WITH CORONARY ANGIOGRAM N/A 07/19/2013   Procedure: LEFT HEART CATHETERIZATION WITH CORONARY ANGIOGRAM;  Surgeon: Sinclair Grooms, MD;  Location: [facility] CATH LAB;  Service: Cardiovascular;  Laterality: N/A;  . POLYPECTOMY  08-05-09   2 polyps  . TOTAL KNEE ARTHROPLASTY Left 03/28/2012   Procedure: LEFT TOTAL  KNEE ARTHROPLASTY;  Surgeon: Tobi Bastos, MD;  Location: [facility] ORS;  Service: Orthopedics;  Laterality: Left;  . TUBAL LIGATION    . VIDEO BRONCHOSCOPY WITH ENDOBRONCHIAL ULTRASOUND N/A 07/11/2016   Procedure: VIDEO BRONCHOSCOPY WITH ENDOBRONCHIAL ULTRASOUND;  Surgeon: Marshell Garfinkel, MD;  Location: [facility];  Service: Pulmonary;  Laterality: N/A;    REVIEW OF SYSTEMS:  A comprehensive review of systems was negative except for: Constitutional: positive for  fatigue Respiratory: positive for cough and dyspnea on exertion   PHYSICAL EXAMINATION: General appearance: alert, cooperative, fatigued and no distress Head: Normocephalic, without obvious abnormality, atraumatic Neck: no adenopathy, no JVD, supple, symmetrical, trachea midline and thyroid not enlarged, symmetric, no tenderness/mass/nodules Lymph nodes: Cervical, supraclavicular, and axillary nodes normal. Resp: clear to auscultation bilaterally Back: symmetric, no curvature. ROM normal. No CVA tenderness. Cardio: regular rate and rhythm, S1, S2 normal, no murmur, click, rub or gallop GI: soft, non-tender; bowel sounds normal; no masses,  no organomegaly Extremities: extremities normal, atraumatic, no cyanosis or edema  ECOG PERFORMANCE STATUS: 1 - Symptomatic but completely ambulatory  Blood pressure (!) 148/88, pulse (!) 114, temperature 98 F (36.7 C), temperature source Oral, resp. rate 20, SpO2 95 %.  LABORATORY DATA: Lab Results  Component Value Date   WBC 5.3 12/29/2016   HGB 13.4 12/29/2016   HCT 41.3 12/29/2016   MCV 92.0 12/29/2016   PLT 282 12/29/2016      Chemistry      Component Value Date/Time   NA 139 11/17/2016 1208   K 3.8 11/17/2016 1208   CL 102 09/23/2016 1322   CO2 24 11/17/2016 1208   BUN 24.6 11/17/2016 1208   CREATININE 0.9 11/17/2016 1208      Component Value Date/Time   CALCIUM 9.1 11/17/2016 1208   ALKPHOS 59 11/17/2016 1208   AST 18 11/17/2016 1208   ALT 13 11/17/2016 1208   BILITOT 0.56 11/17/2016 1208       RADIOGRAPHIC STUDIES: No results found.  ASSESSMENT AND PLAN: This is a very pleasant 76 years old Asian female recently diagnosed with a stage IIIa non-small cell lung cancer, adenocarcinoma. She underwent a course of concurrent chemoradiation with weekly carboplatin and paclitaxel status post 7 cycles. The patient tolerated the previous course fairly well except for mild odynophagia and dry cough. The patient is currently  undergoing consolidation treatment with immunotherapy with Imfinzi (Durvalumab) status post 3 cycles. She tolerated her treatment fairly well.  She has been off treatment for the last few weeks because of travel to [place] to separate a family member's 89 birthday. She enjoyed her time there. She is feeling better today except for the dry cough. I recommended for her to proceed with cycle #4 today as a scheduled. I will see her back for follow-up visit in 2 weeks for evaluation before starting cycle #5. For the dry cough, I started the patient on Hycodan 5 mL p.o. every 6 hours as needed. She was advised to call immediately if she has any concerning symptoms in the interval. The patient voices understanding of current disease status and treatment options and is in agreement with the current care plan. All questions were answered. The patient knows to call the clinic with any problems, questions or concerns. We can certainly see the patient much sooner if necessary.  Disclaimer: This note was dictated with voice recognition software. Similar sounding words can inadvertently be transcribed and may not be corrected upon review.

## 2016-12-29 NOTE — Telephone Encounter (Signed)
Gave avs and calendar for December and January 2019 °

## 2017-01-02 ENCOUNTER — Ambulatory Visit: Payer: Medicare Other | Admitting: Radiation Oncology

## 2017-01-05 ENCOUNTER — Encounter: Payer: Self-pay | Admitting: Radiation Oncology

## 2017-01-05 ENCOUNTER — Ambulatory Visit
Admission: RE | Admit: 2017-01-05 | Discharge: 2017-01-05 | Disposition: A | Discharge status: Home / Self Care | Payer: Medicare Other | Source: Ambulatory Visit | Attending: Radiation Oncology | Admitting: Radiation Oncology

## 2017-01-05 ENCOUNTER — Other Ambulatory Visit: Payer: Self-pay

## 2017-01-05 DIAGNOSIS — Z8249 Family history of ischemic heart disease and other diseases of the circulatory system: Secondary | ICD-10-CM | POA: Insufficient documentation

## 2017-01-05 DIAGNOSIS — K862 Cyst of pancreas: Secondary | ICD-10-CM | POA: Diagnosis not present

## 2017-01-05 DIAGNOSIS — R590 Localized enlarged lymph nodes: Secondary | ICD-10-CM | POA: Diagnosis not present

## 2017-01-05 DIAGNOSIS — Z9049 Acquired absence of other specified parts of digestive tract: Secondary | ICD-10-CM | POA: Diagnosis not present

## 2017-01-05 DIAGNOSIS — E119 Type 2 diabetes mellitus without complications: Secondary | ICD-10-CM | POA: Insufficient documentation

## 2017-01-05 DIAGNOSIS — Z510 Encounter for antineoplastic radiation therapy: Secondary | ICD-10-CM | POA: Insufficient documentation

## 2017-01-05 DIAGNOSIS — C3491 Malignant neoplasm of unspecified part of right bronchus or lung: Secondary | ICD-10-CM | POA: Insufficient documentation

## 2017-01-05 DIAGNOSIS — M545 Low back pain: Secondary | ICD-10-CM | POA: Insufficient documentation

## 2017-01-05 DIAGNOSIS — I10 Essential (primary) hypertension: Secondary | ICD-10-CM | POA: Diagnosis not present

## 2017-01-05 DIAGNOSIS — R0602 Shortness of breath: Secondary | ICD-10-CM | POA: Insufficient documentation

## 2017-01-05 DIAGNOSIS — Z809 Family history of malignant neoplasm, unspecified: Secondary | ICD-10-CM | POA: Insufficient documentation

## 2017-01-05 DIAGNOSIS — Z9889 Other specified postprocedural states: Secondary | ICD-10-CM | POA: Insufficient documentation

## 2017-01-05 DIAGNOSIS — Z7984 Long term (current) use of oral hypoglycemic drugs: Secondary | ICD-10-CM | POA: Insufficient documentation

## 2017-01-05 DIAGNOSIS — Z79899 Other long term (current) drug therapy: Secondary | ICD-10-CM | POA: Insufficient documentation

## 2017-01-05 DIAGNOSIS — Z96652 Presence of left artificial knee joint: Secondary | ICD-10-CM | POA: Diagnosis not present

## 2017-01-05 DIAGNOSIS — Z833 Family history of diabetes mellitus: Secondary | ICD-10-CM | POA: Insufficient documentation

## 2017-01-05 DIAGNOSIS — Z8261 Family history of arthritis: Secondary | ICD-10-CM | POA: Diagnosis not present

## 2017-01-05 DIAGNOSIS — Z7901 Long term (current) use of anticoagulants: Secondary | ICD-10-CM | POA: Insufficient documentation

## 2017-01-05 DIAGNOSIS — K8010 Calculus of gallbladder with chronic cholecystitis without obstruction: Secondary | ICD-10-CM | POA: Insufficient documentation

## 2017-01-05 DIAGNOSIS — R05 Cough: Secondary | ICD-10-CM | POA: Diagnosis not present

## 2017-01-05 DIAGNOSIS — R1310 Dysphagia, unspecified: Secondary | ICD-10-CM | POA: Insufficient documentation

## 2017-01-05 MED ORDER — HYDROCOD POLST-CPM POLST ER 10-8 MG/5ML PO SUER: 5.0000 mL | Freq: Every evening | ORAL | 0 refills | Status: DC | PRN

## 2017-01-05 NOTE — Progress Notes (Signed)
Pt is here today for a 1 month follow-up for lung cancer.  Pt denies having any pain. Pt states that she has moderate fatigue since starting her immunotherapy treatments. Pt states that treatments are every 2 weeks. Pt states that she has shortness of breath on exertions. Pt states that she has a dry cough with white sputum that in not thick or thin. Pt denies having any hemoptysis. Pt denies having any pain or difficulty swallowing. Pt states that she has red marks are her skin. Pt states that the trunk of her body is very itchy and is using hydrocortisone cream to help with the itching. Pt states that she has lost weight and her appetite has decreased.

## 2017-01-05 NOTE — Progress Notes (Signed)
Radiation Oncology         [phone] ________________________________  Name: Nicole [name] MRN: [id number]  Date: 01/05/2017  DOB: [birth year]  Follow-Up Visit Note  CC: [name], MD  [name], *    ICD-10-CM   1. Adenocarcinoma of right lung, stage 3 (HCC) C34.91     Diagnosis:   Stage III-A adenocarcinoma of lung with primary site presenting in the right lower lung area     Interval Since Last Radiation:  3.5 months  08/09/2016-09/19/2016, right lung treated to 60 Gy in 30 fractions at 2 Gy per fraction  Narrative:  The patient returns today for routine follow-up.  She reports that she is doing well overall. She states she is to have a CT scan next month but is unsure of when exactly.  On review of systems, she reports SOB and pruritis related to her new immunotherapy. She reports a dry cough that is very bothersome to her. She denies loss of appetite or swallowing problems. She denies any new bony pain headaches or visual problems.   ALLERGIES:  is allergic to no known allergies.  Meds: Current Outpatient Medications  Medication Sig Dispense Refill  . COLLAGEN PO Take by mouth.    . diltiazem (CARDIZEM CD) 120 MG 24 hr capsule Take 1 capsule (120 mg total) by mouth daily. 30 capsule 11  . ELIQUIS 5 MG TABS tablet take 1 tablet by mouth twice a day 60 tablet 10  . HYDROcodone-homatropine (HYCODAN) 5-1.5 MG/5ML syrup Take 5 mLs by mouth every 6 (six) hours as needed for cough. 120 mL 0  . Lactobacillus (PROBIOTIC ACIDOPHILUS PO) Take by mouth.    . Multiple Vitamins-Minerals (MULTIVITAMIN WITH MINERALS) tablet Take 1 tablet by mouth daily.     No current facility-administered medications for this encounter.    REVIEW OF SYSTEMS: A 10+ POINT REVIEW OF SYSTEMS WAS OBTAINED including neurology, dermatology, psychiatry, cardiac, respiratory, lymph, extremities, GI, GU, musculoskeletal, constitutional, reproductive, HEENT. All pertinent positives are noted  in the HPI. All others are negative.  Physical Findings: The patient is in no acute distress. Patient is alert and oriented.  weight is 167 lb (75.8 kg). Her oral temperature is 98.7 F (37.1 C). Her blood pressure is 126/70 and her pulse is 96. Her oxygen saturation is 96%. .   Lungs are clear to auscultation bilaterally. Heart has regular rate and rhythm. No palpable cervical, supraclavicular, or axillary adenopathy. Abdomen soft, non-tender, normal bowel sounds.   Lab Findings: Lab Results  Component Value Date   WBC 5.3 12/29/2016   HGB 13.4 12/29/2016   HCT 41.3 12/29/2016   MCV 92.0 12/29/2016   PLT 282 12/29/2016    Radiographic Findings: No results found.  Impression:  Stage III-A adenocarcinoma of lung with primary site presenting in the right lower lung area. Patient continues on immunotherapy. This is caused some manageable problems with itching as well as some problems with a dry cough. Patient will proceed with 2 more cycles and then likely undergo CT scanning in January.    Plan:  She will have PRN follow up with radiation oncology. She will continue to follow up closely with medication oncology.   ____________________________________ -----------------------------------  Blair Promise, PhD, MD  This document serves as a record of services personally performed by Gery Pray, MD. It was created on his behalf by Marlowe Kays, a trained medical scribe. The creation of this record is based on the scribe's personal observations and the  provider's statements to them. This document has been checked and approved by the attending provider.

## 2017-01-07 ENCOUNTER — Ambulatory Visit: Payer: Self-pay | Admitting: Radiation Oncology

## 2017-01-12 ENCOUNTER — Ambulatory Visit (HOSPITAL_BASED_OUTPATIENT_CLINIC_OR_DEPARTMENT_OTHER): Payer: Medicare Other | Admitting: Oncology

## 2017-01-12 ENCOUNTER — Other Ambulatory Visit (HOSPITAL_BASED_OUTPATIENT_CLINIC_OR_DEPARTMENT_OTHER): Payer: Medicare Other

## 2017-01-12 ENCOUNTER — Ambulatory Visit (HOSPITAL_BASED_OUTPATIENT_CLINIC_OR_DEPARTMENT_OTHER): Payer: Medicare Other

## 2017-01-12 VITALS — BP 142/80 | HR 102 | Temp 99.2°F | Resp 17 | Ht 68.0 in | Wt 165.1 lb

## 2017-01-12 DIAGNOSIS — J40 Bronchitis, not specified as acute or chronic: Secondary | ICD-10-CM

## 2017-01-12 DIAGNOSIS — Z5112 Encounter for antineoplastic immunotherapy: Secondary | ICD-10-CM

## 2017-01-12 DIAGNOSIS — C3431 Malignant neoplasm of lower lobe, right bronchus or lung: Secondary | ICD-10-CM | POA: Diagnosis not present

## 2017-01-12 DIAGNOSIS — R05 Cough: Secondary | ICD-10-CM | POA: Diagnosis not present

## 2017-01-12 DIAGNOSIS — R5382 Chronic fatigue, unspecified: Secondary | ICD-10-CM

## 2017-01-12 DIAGNOSIS — R059 Cough, unspecified: Secondary | ICD-10-CM

## 2017-01-12 DIAGNOSIS — C3491 Malignant neoplasm of unspecified part of right bronchus or lung: Secondary | ICD-10-CM

## 2017-01-12 LAB — COMPREHENSIVE METABOLIC PANEL
ALT: 9 U/L (ref 0–55)
AST: 13 U/L (ref 5–34)
Albumin: 2.9 g/dL — ABNORMAL LOW (ref 3.5–5.0)
Alkaline Phosphatase: 74 U/L (ref 40–150)
Anion Gap: 8 mEq/L (ref 3–11)
BUN: 18 mg/dL (ref 7.0–26.0)
CO2: 23 mEq/L (ref 22–29)
Calcium: 8.9 mg/dL (ref 8.4–10.4)
Chloride: 107 mEq/L (ref 98–109)
Creatinine: 1 mg/dL (ref 0.6–1.1)
EGFR: 59 mL/min/{1.73_m2} — ABNORMAL LOW (ref >60)
Glucose: 126 mg/dl (ref 70–140)
Potassium: 3.9 mEq/L (ref 3.5–5.1)
Sodium: 138 mEq/L (ref 136–145)
Total Bilirubin: 0.41 mg/dL (ref 0.20–1.20)
Total Protein: 7.2 g/dL (ref 6.4–8.3)

## 2017-01-12 LAB — CBC WITH DIFFERENTIAL/PLATELET
BASO%: 0.4 % (ref 0.0–2.0)
Basophils Absolute: 0 10*3/uL (ref 0.0–0.1)
EOS%: 4.2 % (ref 0.0–7.0)
Eosinophils Absolute: 0.2 10*3/uL (ref 0.0–0.5)
HCT: 39.8 % (ref 34.8–46.6)
HGB: 13 g/dL (ref 11.6–15.9)
LYMPH%: 11.1 % — ABNORMAL LOW (ref 14.0–49.7)
MCH: 29.2 pg (ref 25.1–34.0)
MCHC: 32.7 g/dL (ref 31.5–36.0)
MCV: 89.4 fL (ref 79.5–101.0)
MONO#: 0.5 10*3/uL (ref 0.1–0.9)
MONO%: 9.8 % (ref 0.0–14.0)
NEUT#: 3.9 10*3/uL (ref 1.5–6.5)
NEUT%: 74.5 % (ref 38.4–76.8)
Platelets: 230 10*3/uL (ref 145–400)
RBC: 4.45 10*6/uL (ref 3.70–5.45)
RDW: 13.3 % (ref 11.2–14.5)
WBC: 5.2 10*3/uL (ref 3.9–10.3)
lymph#: 0.6 10*3/uL — ABNORMAL LOW (ref 0.9–3.3)

## 2017-01-12 LAB — RESEARCH LABS

## 2017-01-12 MED ORDER — SODIUM CHLORIDE 0.9 % IV SOLN
Freq: Once | INTRAVENOUS | Status: AC
  Administered 2017-01-12: 13:00:00 via INTRAVENOUS

## 2017-01-12 MED ORDER — LEVOFLOXACIN 500 MG PO TABS: 500.0000 mg | ORAL_TABLET | Freq: Every day | ORAL | 0 refills | Status: DC

## 2017-01-12 MED ORDER — DURVALUMAB 500 MG/10ML IV SOLN
10.0000 mg/kg | Freq: Once | INTRAVENOUS | Status: AC
  Administered 2017-01-12: 740 mg via INTRAVENOUS
  Filled 2017-01-12: qty 10

## 2017-01-12 NOTE — Progress Notes (Signed)
[place] OFFICE PROGRESS NOTE  [name], [facility] [address] [place] [address]  DIAGNOSIS: Stage IIIa (T1b, N2, M0) non-small cell lung cancer, adenocarcinoma presented with right lower lobe lung mass in addition to right hilar and subcarinal lymphadenopathy diagnosed in June 2018.  Biomarker Findings Tumor Mutational Burden - TMB-Intermediate (8 Muts/Mb) Microsatellite Status - Cannot Be Determined Genomic Findings For a complete list of the genes assayed, please refer to the Appendix. EGFR E709K, G719S MCN4BS J628Z MOQ9U7 splice site 654-6T>K 7 Disease relevant genes with no reportable alterations: KRAS, ALK, BRAF, MET, ERBB2, RET, ROS1  PDL1 Expression 70%.  PRIOR THERAPY: Concurrent chemoradiation with weekly carboplatin for AUC of 2 and paclitaxel 45 MG/M2. Status post 7 cycles with partial response.  CURRENT THERAPY: Consolidation treatment with immunotherapy with Imfinzi (Durvalumab) 10 MG/KG every 2 weeks, first dose 10/19/2016.  Status post 4 cycles.  INTERVAL HISTORY: MIDA CORY 76 y.o. female returns for routine follow-up visit by herself.  The patient reports that she is having an increased cough with sputum production, increased shortness of breath, and rash.  Patient reports that her cough has been present for over a month and is getting progressively worse.  It is often a dry cough but sometimes she has yellow sputum production.  She has been using Hycodan which helps some.  She takes this mostly at night and in the afternoon when she naps.  Her shortness of breath is increased when she is doing things, but notices some increased shortness of breath when she is at rest.  She denies having any fevers or chills, but her temperature today in our office is a low-grade temp of 99.2.  She denies chest pain and hemoptysis.  Denies nausea, vomiting, constipation, diarrhea.  She does have an ongoing rash which is unchanged from previous  reports.  The patient is here for evaluation prior to cycle 5 of her Imfinzi.  MEDICAL HISTORY: Past Medical History:  Diagnosis Date  . Adenocarcinoma of right lung, stage 3 ([place]) 07/28/2016  . Bronchitis    hx of  . Cancer [facility]) 2004   uterine/cervical  . Diabetes mellitus    type 2  . Gallstones   . Headache   . History of radiation therapy 08/09/2016 to 09/19/2016   Right lung was treated to 60 Gy in 30 fractions at 2 Gy per fraction  . Hypertension   . Low back pain   . Lung mass    with lymphadenopathy  . Osteoarthritis     ALLERGIES:  is allergic to no known allergies.  MEDICATIONS:  Current Outpatient Medications  Medication Sig Dispense Refill  . diltiazem (CARDIZEM CD) 120 MG 24 hr capsule Take 1 capsule (120 mg total) by mouth daily. 30 capsule 11  . ELIQUIS 5 MG TABS tablet take 1 tablet by mouth twice a day 60 tablet 10  . HYDROcodone-homatropine (HYCODAN) 5-1.5 MG/5ML syrup Take 5 mLs by mouth every 6 (six) hours as needed for cough. 120 mL 0  . Lactobacillus (PROBIOTIC ACIDOPHILUS PO) Take by mouth.    . Multiple Vitamins-Minerals (MULTIVITAMIN WITH MINERALS) tablet Take 1 tablet by mouth daily.    . chlorpheniramine-HYDROcodone (TUSSIONEX) 10-8 MG/5ML SUER Take 5 mLs by mouth at bedtime as needed for cough. (Patient not taking: Reported on 01/12/2017) 115 mL 0  . COLLAGEN PO Take by mouth.    . levofloxacin (LEVAQUIN) 500 MG tablet Take 1 tablet (500 mg total) by mouth daily. 10  tablet 0   No current facility-administered medications for this visit.     SURGICAL HISTORY:  Past Surgical History:  Procedure Laterality Date  . ABDOMINAL HYSTERECTOMY  2004  . APPENDECTOMY  when 76 years old  . COLONOSCOPY  08-05-09   [name]  . INCONTINENCE SURGERY    . KNEE ARTHROSCOPY Left   . LEFT HEART CATHETERIZATION WITH CORONARY ANGIOGRAM N/A 07/19/2013   Procedure: LEFT HEART CATHETERIZATION WITH CORONARY ANGIOGRAM;  Surgeon: Sinclair Grooms, MD;  Location: [facility] CATH  LAB;  Service: Cardiovascular;  Laterality: N/A;  . POLYPECTOMY  08-05-09   2 polyps  . TOTAL KNEE ARTHROPLASTY Left 03/28/2012   Procedure: LEFT TOTAL KNEE ARTHROPLASTY;  Surgeon: Tobi Bastos, MD;  Location: [facility] ORS;  Service: Orthopedics;  Laterality: Left;  . TUBAL LIGATION    . VIDEO BRONCHOSCOPY WITH ENDOBRONCHIAL ULTRASOUND N/A 07/11/2016   Procedure: VIDEO BRONCHOSCOPY WITH ENDOBRONCHIAL ULTRASOUND;  Surgeon: Marshell Garfinkel, MD;  Location: [place];  Service: Pulmonary;  Laterality: N/A;    REVIEW OF SYSTEMS:   Review of Systems  Constitutional: Negative for appetite change, chills, fatigue, fever and unexpected weight change.  HENT:   Negative for mouth sores, nosebleeds, sore throat and trouble swallowing.   Eyes: Negative for eye problems and icterus.  Respiratory: Negative for hemoptysis, and wheezing.  Positive for cough and shortness of breath. Cardiovascular: Negative for chest pain and leg swelling.  Gastrointestinal: Negative for abdominal pain, constipation, diarrhea, nausea and vomiting.  Genitourinary: Negative for bladder incontinence, difficulty urinating, dysuria, frequency and hematuria.   Musculoskeletal: Negative for back pain, gait problem, neck pain and neck stiffness.  Skin: Negative for itching. Positive for rash. Neurological: Negative for dizziness, extremity weakness, gait problem, headaches, light-headedness and seizures.  Hematological: Negative for adenopathy. Does not bruise/bleed easily.  Psychiatric/Behavioral: Negative for confusion, depression and sleep disturbance. The patient is not nervous/anxious.     PHYSICAL EXAMINATION:  Blood pressure (!) 142/80, pulse (!) 102, temperature 99.2 F (37.3 C), temperature source Oral, resp. rate 17, height 5' 8" (1.727 m), weight 165 lb 1.6 oz (74.9 kg), SpO2 95 %.  ECOG PERFORMANCE STATUS: 1 - Symptomatic but completely ambulatory  Physical Exam  Constitutional: Oriented to person, place, and time and  well-developed, well-nourished, and in no distress. No distress.  HENT:  Head: Normocephalic and atraumatic.  Mouth/Throat: Oropharynx is clear and moist. No oropharyngeal exudate.  Eyes: Conjunctivae are normal. Right eye exhibits no discharge. Left eye exhibits no discharge. No scleral icterus.  Neck: Normal range of motion. Neck supple.  Cardiovascular: Normal rate, regular rhythm, normal heart sounds and intact distal pulses.   Pulmonary/Chest: Effort normal and breath sounds normal. No respiratory distress. No wheezes. No rales.  Abdominal: Soft. Bowel sounds are normal. Exhibits no distension and no mass. There is no tenderness.  Musculoskeletal: Normal range of motion. Exhibits no edema.  Lymphadenopathy:    No cervical adenopathy.  Neurological: Alert and oriented to person, place, and time. Exhibits normal muscle tone. Gait normal. Coordination normal.  Skin: Skin is warm and dry. Not diaphoretic. No erythema. No pallor. Faint rash noted to bilateral arms. Psychiatric: Mood, memory and judgment normal.  Vitals reviewed.  LABORATORY DATA: Lab Results  Component Value Date   WBC 5.2 01/12/2017   HGB 13.0 01/12/2017   HCT 39.8 01/12/2017   MCV 89.4 01/12/2017   PLT 230 01/12/2017      Chemistry      Component Value Date/Time   NA 138 01/12/2017  1041   K 3.9 01/12/2017 1041   CL 102 09/23/2016 1322   CO2 23 01/12/2017 1041   BUN 18.0 01/12/2017 1041   CREATININE 1.0 01/12/2017 1041      Component Value Date/Time   CALCIUM 8.9 01/12/2017 1041   ALKPHOS 74 01/12/2017 1041   AST 13 01/12/2017 1041   ALT 9 01/12/2017 1041   BILITOT 0.41 01/12/2017 1041       RADIOGRAPHIC STUDIES:  No results found.   ASSESSMENT/PLAN:  Adenocarcinoma of right lung, stage 3 (HCC) This is a very pleasant 76 year old Asian female recently diagnosed with a stage IIIa non-small cell lung cancer, adenocarcinoma. She underwent a course of concurrent chemoradiation with weekly  carboplatin and paclitaxel status post 7 cycles. The patient tolerated the previous course fairly well except for mild odynophagia and dry cough. The patient is currently undergoing consolidation treatment with immunotherapy with Imfinzi (Durvalumab) status post 4 cycles. She tolerated her treatment fairly well.    She resumed her treatment 2 weeks ago after being off for approximately 1 month to visit her family in [place]. She is having worsening of her cough and shortness of breath.  She also has a low-grade temperature today.  She has had ongoing rash while on treatment.  Discussed with Dr. Julien Nordmann.  Recommend that we proceed with a CT of the chest to evaluate her disease and evaluate for pneumonitis.  She was given a prescription for levofloxacin 500 mg daily for 10 days.  I recommended for her to proceed with cycle #5 today as a scheduled.  She will have a follow-up visit in 2 weeks for evaluation prior to cycle 6 and to discuss her CT scan results.  She will continue Hycodan for her cough.  She was advised to call immediately if she has any concerning symptoms in the interval. The patient voices understanding of current disease status and treatment options and is in agreement with the current care plan. All questions were answered. The patient knows to call the clinic with any problems, questions or concerns. We can certainly see the patient much sooner if necessary.  Orders Placed This Encounter  Procedures  . CT CHEST W CONTRAST    Standing Status:   Future    Standing Expiration Date:   01/12/2018    Order Specific Question:   If indicated for the ordered procedure, I authorize the administration of contrast media per Radiology protocol    Answer:   Yes    Order Specific Question:   Preferred imaging location?    Answer:   [facility]    Order Specific Question:   Radiology Contrast Protocol - do NOT remove file path    Answer:    [email or web address]    Order Specific Question:   Reason for Exam additional comments    Answer:   lung cancer. Restaging. Increased cough and dyspnea.     Mikey Bussing, DNP, AGPCNP-BC, AOCNP 01/13/17

## 2017-01-12 NOTE — Patient Instructions (Signed)
[facility] Discharge Instructions for Patients Receiving Chemotherapy  Today you received the following chemotherapy agents: Imfinzi.  To help prevent nausea and vomiting after your treatment, we encourage you to take your nausea medication as directed.   If you develop nausea and vomiting that is not controlled by your nausea medication, call the clinic.   BELOW ARE SYMPTOMS THAT SHOULD BE REPORTED IMMEDIATELY:  *FEVER GREATER THAN 100.5 F  *CHILLS WITH OR WITHOUT FEVER  NAUSEA AND VOMITING THAT IS NOT CONTROLLED WITH YOUR NAUSEA MEDICATION  *UNUSUAL SHORTNESS OF BREATH  *UNUSUAL BRUISING OR BLEEDING  TENDERNESS IN MOUTH AND THROAT WITH OR WITHOUT PRESENCE OF ULCERS  *URINARY PROBLEMS  *BOWEL PROBLEMS  UNUSUAL RASH Items with * indicate a potential emergency and should be followed up as soon as possible.  Feel free to call the clinic should you have any questions or concerns. The clinic phone number is [phone].  Please show the CHEMO ALERT CARD at check-in to the Emergency Department and triage nurse.   

## 2017-01-12 NOTE — Progress Notes (Signed)
Per Cyril Mourning, NP ok to treat with heart rate greater than 100

## 2017-01-13 ENCOUNTER — Encounter: Payer: Self-pay | Admitting: Oncology

## 2017-01-13 DIAGNOSIS — J40 Bronchitis, not specified as acute or chronic: Secondary | ICD-10-CM | POA: Insufficient documentation

## 2017-01-13 DIAGNOSIS — R05 Cough: Secondary | ICD-10-CM | POA: Insufficient documentation

## 2017-01-13 DIAGNOSIS — R053 Chronic cough: Secondary | ICD-10-CM | POA: Insufficient documentation

## 2017-01-13 NOTE — Assessment & Plan Note (Signed)
This is a very pleasant 76 year old Asian female recently diagnosed with a stage IIIa non-small cell lung cancer, adenocarcinoma. She underwent a course of concurrent chemoradiation with weekly carboplatin and paclitaxel status post 7 cycles. The patient tolerated the previous course fairly well except for mild odynophagia and dry cough. The patient is currently undergoing consolidation treatment with immunotherapy with Imfinzi (Durvalumab) status post 4 cycles. She tolerated her treatment fairly well.    She resumed her treatment 2 weeks ago after being off for approximately 1 month to visit her family in [place]. She is having worsening of her cough and shortness of breath.  She also has a low-grade temperature today.  She has had ongoing rash while on treatment.  Discussed with Dr. Julien Nordmann.  Recommend that we proceed with a CT of the chest to evaluate her disease and evaluate for pneumonitis.  She was given a prescription for levofloxacin 500 mg daily for 10 days.  I recommended for her to proceed with cycle #5 today as a scheduled.  She will have a follow-up visit in 2 weeks for evaluation prior to cycle 6 and to discuss her CT scan results.  She will continue Hycodan for her cough.  She was advised to call immediately if she has any concerning symptoms in the interval. The patient voices understanding of current disease status and treatment options and is in agreement with the current care plan. All questions were answered. The patient knows to call the clinic with any problems, questions or concerns. We can certainly see the patient much sooner if necessary.

## 2017-01-20 ENCOUNTER — Telehealth: Payer: Self-pay | Admitting: *Deleted

## 2017-01-20 MED ORDER — HYDROCODONE-HOMATROPINE 5-1.5 MG/5ML PO SYRP: 5.0000 mL | ORAL_SOLUTION | Freq: Four times a day (QID) | ORAL | 0 refills | Status: DC | PRN

## 2017-01-20 NOTE — Telephone Encounter (Signed)
Notified pt ok to pick up Hycodan Rx

## 2017-01-25 ENCOUNTER — Ambulatory Visit (HOSPITAL_COMMUNITY)
Admission: RE | Admit: 2017-01-25 | Discharge: 2017-01-25 | Disposition: A | Discharge status: Home / Self Care | Payer: Medicare Other | Source: Ambulatory Visit | Attending: Oncology | Admitting: Oncology

## 2017-01-25 DIAGNOSIS — C3491 Malignant neoplasm of unspecified part of right bronchus or lung: Secondary | ICD-10-CM | POA: Diagnosis present

## 2017-01-25 DIAGNOSIS — I2510 Atherosclerotic heart disease of native coronary artery without angina pectoris: Secondary | ICD-10-CM | POA: Diagnosis not present

## 2017-01-25 DIAGNOSIS — I080 Rheumatic disorders of both mitral and aortic valves: Secondary | ICD-10-CM | POA: Diagnosis not present

## 2017-01-25 DIAGNOSIS — R911 Solitary pulmonary nodule: Secondary | ICD-10-CM | POA: Diagnosis not present

## 2017-01-25 DIAGNOSIS — I700 Atherosclerosis of aorta: Secondary | ICD-10-CM | POA: Diagnosis not present

## 2017-01-25 MED ORDER — IOPAMIDOL (ISOVUE-300) INJECTION 61%
75.0000 mL | Freq: Once | INTRAVENOUS | Status: AC | PRN
  Administered 2017-01-25: 75 mL via INTRAVENOUS

## 2017-01-25 MED ORDER — IOPAMIDOL (ISOVUE-300) INJECTION 61%
INTRAVENOUS | Status: AC
  Filled 2017-01-25: qty 75

## 2017-01-26 ENCOUNTER — Encounter: Payer: Self-pay | Admitting: Oncology

## 2017-01-26 ENCOUNTER — Other Ambulatory Visit: Payer: Medicare Other

## 2017-01-26 ENCOUNTER — Inpatient Hospital Stay: Payer: Medicare Other | Attending: Oncology | Admitting: Oncology

## 2017-01-26 ENCOUNTER — Other Ambulatory Visit (HOSPITAL_BASED_OUTPATIENT_CLINIC_OR_DEPARTMENT_OTHER): Payer: Medicare Other

## 2017-01-26 ENCOUNTER — Ambulatory Visit: Payer: Medicare Other

## 2017-01-26 VITALS — BP 131/82 | HR 110 | Temp 98.4°F | Resp 20 | Ht 68.0 in | Wt 166.9 lb

## 2017-01-26 DIAGNOSIS — Z5112 Encounter for antineoplastic immunotherapy: Secondary | ICD-10-CM

## 2017-01-26 DIAGNOSIS — M1990 Unspecified osteoarthritis, unspecified site: Secondary | ICD-10-CM | POA: Insufficient documentation

## 2017-01-26 DIAGNOSIS — I2510 Atherosclerotic heart disease of native coronary artery without angina pectoris: Secondary | ICD-10-CM | POA: Insufficient documentation

## 2017-01-26 DIAGNOSIS — R0609 Other forms of dyspnea: Secondary | ICD-10-CM | POA: Insufficient documentation

## 2017-01-26 DIAGNOSIS — R5383 Other fatigue: Secondary | ICD-10-CM | POA: Insufficient documentation

## 2017-01-26 DIAGNOSIS — J700 Acute pulmonary manifestations due to radiation: Secondary | ICD-10-CM | POA: Diagnosis not present

## 2017-01-26 DIAGNOSIS — Z79899 Other long term (current) drug therapy: Secondary | ICD-10-CM | POA: Insufficient documentation

## 2017-01-26 DIAGNOSIS — R5382 Chronic fatigue, unspecified: Secondary | ICD-10-CM

## 2017-01-26 DIAGNOSIS — C3431 Malignant neoplasm of lower lobe, right bronchus or lung: Secondary | ICD-10-CM | POA: Diagnosis not present

## 2017-01-26 DIAGNOSIS — C3491 Malignant neoplasm of unspecified part of right bronchus or lung: Secondary | ICD-10-CM | POA: Insufficient documentation

## 2017-01-26 DIAGNOSIS — R05 Cough: Secondary | ICD-10-CM | POA: Insufficient documentation

## 2017-01-26 DIAGNOSIS — Z8701 Personal history of pneumonia (recurrent): Secondary | ICD-10-CM | POA: Insufficient documentation

## 2017-01-26 DIAGNOSIS — Z9225 Personal history of immunosupression therapy: Secondary | ICD-10-CM | POA: Insufficient documentation

## 2017-01-26 DIAGNOSIS — I700 Atherosclerosis of aorta: Secondary | ICD-10-CM | POA: Insufficient documentation

## 2017-01-26 DIAGNOSIS — Z7952 Long term (current) use of systemic steroids: Secondary | ICD-10-CM | POA: Insufficient documentation

## 2017-01-26 DIAGNOSIS — Z9221 Personal history of antineoplastic chemotherapy: Secondary | ICD-10-CM | POA: Insufficient documentation

## 2017-01-26 DIAGNOSIS — I10 Essential (primary) hypertension: Secondary | ICD-10-CM | POA: Insufficient documentation

## 2017-01-26 LAB — CBC WITH DIFFERENTIAL/PLATELET
BASO%: 0.6 % (ref 0.0–2.0)
Basophils Absolute: 0 10*3/uL (ref 0.0–0.1)
EOS%: 3.7 % (ref 0.0–7.0)
Eosinophils Absolute: 0.2 10*3/uL (ref 0.0–0.5)
HCT: 39.3 % (ref 34.8–46.6)
HGB: 13 g/dL (ref 11.6–15.9)
LYMPH%: 9.8 % — ABNORMAL LOW (ref 14.0–49.7)
MCH: 28.6 pg (ref 25.1–34.0)
MCHC: 33 g/dL (ref 31.5–36.0)
MCV: 86.7 fL (ref 79.5–101.0)
MONO#: 0.5 10*3/uL (ref 0.1–0.9)
MONO%: 9.7 % (ref 0.0–14.0)
NEUT#: 4.2 10*3/uL (ref 1.5–6.5)
NEUT%: 76.2 % (ref 38.4–76.8)
Platelets: 266 10*3/uL (ref 145–400)
RBC: 4.53 10*6/uL (ref 3.70–5.45)
RDW: 14.5 % (ref 11.2–14.5)
WBC: 5.5 10*3/uL (ref 3.9–10.3)
lymph#: 0.5 10*3/uL — ABNORMAL LOW (ref 0.9–3.3)

## 2017-01-26 LAB — COMPREHENSIVE METABOLIC PANEL
ALT: 12 U/L (ref 0–55)
AST: 13 U/L (ref 5–34)
Albumin: 2.9 g/dL — ABNORMAL LOW (ref 3.5–5.0)
Alkaline Phosphatase: 72 U/L (ref 40–150)
Anion Gap: 8 mEq/L (ref 3–11)
BUN: 22 mg/dL (ref 7.0–26.0)
CO2: 24 mEq/L (ref 22–29)
Calcium: 9.2 mg/dL (ref 8.4–10.4)
Chloride: 107 mEq/L (ref 98–109)
Creatinine: 0.9 mg/dL (ref 0.6–1.1)
EGFR: >60 mL/min/{1.73_m2} (ref >60)
Glucose: 128 mg/dl (ref 70–140)
Potassium: 4.1 mEq/L (ref 3.5–5.1)
Sodium: 139 mEq/L (ref 136–145)
Total Bilirubin: 0.37 mg/dL (ref 0.20–1.20)
Total Protein: 7.3 g/dL (ref 6.4–8.3)

## 2017-01-26 LAB — TSH: TSH: 0.76 m(IU)/L (ref 0.308–3.960)

## 2017-01-26 MED ORDER — HYDROCODONE-HOMATROPINE 5-1.5 MG/5ML PO SYRP: 5.0000 mL | ORAL_SOLUTION | Freq: Four times a day (QID) | ORAL | 0 refills | Status: DC | PRN

## 2017-01-26 MED ORDER — PREDNISONE 10 MG PO TABS: ORAL_TABLET | ORAL | 0 refills | Status: DC

## 2017-01-26 NOTE — Progress Notes (Signed)
[facility] OFFICE PROGRESS NOTE  Nicole [name], [name] [address] [place] [address]  DIAGNOSIS: Stage IIIa (T1b, N2, M0) non-small cell lung cancer, adenocarcinoma presented with right lower lobe lung mass in addition to right hilar and subcarinal lymphadenopathy diagnosed in June 2018.  Biomarker Findings Tumor Mutational Burden - TMB-Intermediate (8 Muts/Mb) Microsatellite Status - Cannot Be Determined Genomic Findings For a complete list of the genes assayed, please refer to the Appendix. EGFR E709K, G719S ZWC5EN I778E UMP5T6 splice site [id number]>V 7 Disease relevant genes with no reportable alterations: KRAS, ALK, BRAF, MET, ERBB2, RET, ROS1  PDL1 Expression70%.  PRIOR THERAPY: Concurrent chemoradiation with weekly carboplatin for AUC of 2 and paclitaxel 45 MG/M2. Status post 7 cycles with partial response.  CURRENT THERAPY: Consolidation treatment with immunotherapy with Imfinzi (Durvalumab) 10 MG/KG every 2 weeks, first dose 10/19/2016.Status post 5 cycles.  INTERVAL HISTORY: Nicole [name] 77 y.o. female returns for routine follow-up visit accompanied by her daughter-in-law.  The patient reports an ongoing cough which is somewhat controlled with Hycodan.  She reports that the cough is really bothersome and is affecting her quality of life.  The cough is nonproductive.  She denies fevers and chills.  She reports shortness of breath with exertion and occasional shortness of breath at rest.  Denies nausea, vomiting, constipation, diarrhea.  She has no significant weight loss or night sweats.  The patient had a recent restaging CT scan of the chest and is here to discuss the results and for evaluation prior to cycle #6 of Imfinzi.  MEDICAL HISTORY: Past Medical History:  Diagnosis Date  . Adenocarcinoma of right lung, stage 3 ([place]) 07/28/2016  . Bronchitis    hx of  . 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 Diabetes mellitus    type 2  .  Gallstones   . Headache   . History of radiation therapy 08/09/2016 to 09/19/2016   Right lung was treated to 60 Gy in 30 fractions at 2 Gy per fraction  . Hypertension   . Low back pain   . Lung mass    with lymphadenopathy  . Osteoarthritis     ALLERGIES:  is allergic to no known allergies.  MEDICATIONS:  Current Outpatient Medications  Medication Sig Dispense Refill  . chlorpheniramine-HYDROcodone (TUSSIONEX) 10-8 MG/5ML SUER Take 5 mLs by mouth at bedtime as needed for cough. (Patient not taking: Reported on 01/12/2017) 115 mL 0  . COLLAGEN PO Take by mouth.    . diltiazem (CARDIZEM CD) 120 MG 24 hr capsule Take 1 capsule (120 mg total) by mouth daily. 30 capsule 11  . ELIQUIS 5 MG TABS tablet take 1 tablet by mouth twice a day 60 tablet 10  . HYDROcodone-homatropine (HYCODAN) 5-1.5 MG/5ML syrup Take 5 mLs by mouth every 6 (six) hours as needed for cough. 120 mL 0  . Lactobacillus (PROBIOTIC ACIDOPHILUS PO) Take by mouth.    . Multiple Vitamins-Minerals (MULTIVITAMIN WITH MINERALS) tablet Take 1 tablet by mouth daily.    . predniSONE (DELTASONE) 10 MG tablet Take 7 tabs (70 mg) daily X 1 week, then 6 tabs (60 mg) daily X 1 week, then 5 tabs (50 mg daily) X 1 week, then 4 tabs (40 mg) daily X 1 week, then 3 tabs (30 mg) daily X 1 week, then 2 tabs (20 mg) daily X 1 week, then 1 tab (10 mg) daily X 1 week, then stop. 200 tablet 0   No current  facility-administered medications for this visit.     SURGICAL HISTORY:  Past Surgical History:  Procedure Laterality Date  . ABDOMINAL HYSTERECTOMY  2004  . APPENDECTOMY  when 77 years old  . COLONOSCOPY  08-05-09   [name]  . INCONTINENCE SURGERY    . KNEE ARTHROSCOPY Left   . LEFT HEART CATHETERIZATION WITH CORONARY ANGIOGRAM N/A 07/19/2013   Procedure: LEFT HEART CATHETERIZATION WITH CORONARY ANGIOGRAM;  Surgeon: Sinclair Grooms, MD;  Location: [facility] CATH LAB;  Service: Cardiovascular;  Laterality: N/A;  . POLYPECTOMY  08-05-09   2  polyps  . TOTAL KNEE ARTHROPLASTY Left 03/28/2012   Procedure: LEFT TOTAL KNEE ARTHROPLASTY;  Surgeon: Tobi Bastos, MD;  Location: [facility] ORS;  Service: Orthopedics;  Laterality: Left;  . TUBAL LIGATION    . VIDEO BRONCHOSCOPY WITH ENDOBRONCHIAL ULTRASOUND N/A 07/11/2016   Procedure: VIDEO BRONCHOSCOPY WITH ENDOBRONCHIAL ULTRASOUND;  Surgeon: Marshell Garfinkel, MD;  Location: [facility];  Service: Pulmonary;  Laterality: N/A;    REVIEW OF SYSTEMS:   Review of Systems  Constitutional: Negative for appetite change, chills, fatigue, fever and unexpected weight change.  HENT:   Negative for mouth sores, nosebleeds, sore throat and trouble swallowing.   Eyes: Negative for eye problems and icterus.  Respiratory: Negative for hemoptysis and wheezing.  Positive for cough and shortness of breath.  Cardiovascular: Negative for chest pain and leg swelling.  Gastrointestinal: Negative for abdominal pain, constipation, diarrhea, nausea and vomiting.  Genitourinary: Negative for bladder incontinence, difficulty urinating, dysuria, frequency and hematuria.   Musculoskeletal: Negative for back pain, gait problem, neck pain and neck stiffness.  Skin: Negative for itching and rash.  Neurological: Negative for dizziness, extremity weakness, gait problem, headaches, light-headedness and seizures.  Hematological: Negative for adenopathy. Does not bruise/bleed easily.  Psychiatric/Behavioral: Negative for confusion, depression and sleep disturbance. The patient is not nervous/anxious.     PHYSICAL EXAMINATION:  Blood pressure 131/82, pulse (!) 110, temperature 98.4 F (36.9 C), temperature source Oral, resp. rate 20, height _0  (1.727 m), weight 166 lb 14.4 oz (75.7 kg), SpO2 93 %.  ECOG PERFORMANCE STATUS: 1 - Symptomatic but completely ambulatory  Physical Exam  Constitutional: Oriented to person, place, and time and well-developed, well-nourished, and in no distress. No distress.  HENT:  Head: Normocephalic  and atraumatic.  Mouth/Throat: Oropharynx is clear and moist. No oropharyngeal exudate.  Eyes: Conjunctivae are normal. Right eye exhibits no discharge. Left eye exhibits no discharge. No scleral icterus.  Neck: Normal range of motion. Neck supple.  Cardiovascular: Normal rate, regular rhythm, normal heart sounds and intact distal pulses.   Pulmonary/Chest: Effort normal and breath sounds normal. No respiratory distress. No wheezes. No rales.  Abdominal: Soft. Bowel sounds are normal. Exhibits no distension and no mass. There is no tenderness.  Musculoskeletal: Normal range of motion. Exhibits no edema.  Lymphadenopathy:    No cervical adenopathy.  Neurological: Alert and oriented to person, place, and time. Exhibits normal muscle tone. Gait normal. Coordination normal.  Skin: Skin is warm and dry. No rash noted. Not diaphoretic. No erythema. No pallor.  Psychiatric: Mood, memory and judgment normal.  Vitals reviewed.  LABORATORY DATA: Lab Results  Component Value Date   WBC 5.5 01/26/2017   HGB 13.0 01/26/2017   HCT 39.3 01/26/2017   MCV 86.7 01/26/2017   PLT 266 01/26/2017      Chemistry      Component Value Date/Time   NA 138 01/12/2017 1041   K 3.9 01/12/2017 1041  CL 102 09/23/2016 1322   CO2 23 01/12/2017 1041   BUN 18.0 01/12/2017 1041   CREATININE 1.0 01/12/2017 1041      Component Value Date/Time   CALCIUM 8.9 01/12/2017 1041   ALKPHOS 74 01/12/2017 1041   AST 13 01/12/2017 1041   ALT 9 01/12/2017 1041   BILITOT 0.41 01/12/2017 1041       RADIOGRAPHIC STUDIES:  Ct Chest W Contrast  Result Date: 01/25/2017 CLINICAL DATA:  77 year old female with history of lung cancer with increasing cough and dyspnea. Currently undergoing immunotherapy. Restaging examination. EXAM: CT CHEST WITH CONTRAST TECHNIQUE: Multidetector CT imaging of the chest was performed during intravenous contrast administration. CONTRAST:  4m ISOVUE-300 IOPAMIDOL (ISOVUE-300) INJECTION 61%  COMPARISON:  Chest CT 10/10/2016. FINDINGS: Cardiovascular: Heart size is normal. There is no significant pericardial fluid, thickening or pericardial calcification. There is aortic atherosclerosis, as well as atherosclerosis of the great vessels of the mediastinum and the coronary arteries, including calcified atherosclerotic plaque in the left main, left anterior descending, left circumflex and right coronary arteries. Calcifications of the aortic valve and mitral annulus. Mediastinum/Nodes: No pathologically enlarged mediastinal or hilar lymph nodes. Esophagus is unremarkable in appearance. No axillary lymphadenopathy. Lungs/Pleura: Previously noted right lower lobe nodule is no longer confidently identified. There is a are now all extensive areas of mass-like consolidation throughout the right lung, most evident in the perihilar and paramediastinal aspects, likely to reflect evolving post-radiation changes. No consolidative airspace disease in the left lung. A few scattered tiny 2-3 mm pulmonary nodules are noted, but are nonspecific and are similar to prior examinations. Trace right pleural effusion lying dependently. Upper Abdomen: Status post cholecystectomy. Musculoskeletal: There are no aggressive appearing lytic or blastic lesions noted in the visualized portions of the skeleton. IMPRESSION: 1. Evolving postradiation changes in the right lung now obscuring the previously noted right lower lobe pulmonary nodule. No new pulmonary nodules or masses are noted. No lymphadenopathy in the mediastinal or hilar nodal stations. 2. Aortic atherosclerosis, in addition to left main and 3 vessel coronary artery disease. Assessment for potential risk factor modification, dietary therapy or pharmacologic therapy may be warranted, if clinically indicated. 3. There are calcifications of the aortic valve and mitral annulus. Echocardiographic correlation for evaluation of potential valvular dysfunction may be warranted if  clinically indicated. Aortic Atherosclerosis (ICD10-I70.0). Electronically Signed   By: DVinnie LangtonM.D.   On: 01/25/2017 16:04     ASSESSMENT/PLAN:  Adenocarcinoma of right lung, stage 3 (HCC) This is a very pleasant 77year old Asian female recently diagnosed with a stage IIIa non-small cell lung cancer, adenocarcinoma. She underwent a course of concurrent chemoradiation with weekly carboplatin and paclitaxel status post 7 cycles. The patient tolerated the previous course fairly well except for mild odynophagia and dry cough. The patient is currently undergoing consolidation treatment with immunotherapy with Imfinzi (Durvalumab) status post 5 cycles. She tolerated her treatment fairly well except for ongoing nonproductive cough and shortness of breath.  The patient was seen with Dr. MJulien Nordmann  Patient had a recent restaging CT scan.  Images were shown to the patient and her daughter-in-law.  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  The pneumonitis is likely the cause of her cough.  Options were discussed including continuing her current treatment of Imfinzi and managing the cough with Hycodan versus stopping Imfinzi and treating her with high-dose steroids to help with inflammation and reduce her cough.  After lengthy discussion, the patient is agreeable to proceeding with steroids and stopping the Imfinzi.  A prescription for prednisone 70 mg daily times 1 week, then 60 mg daily times 1 week, then 50 mg daily times 1 week, then 40 mg daily times 1 week, and then 30 mg daily times 1 week, and then 20 mg daily times 1 week, and then 10 mg daily times 1 week was prescribed.  She was reminded to take her prednisone with food and to start over-the-counter omeprazole 1-2 tablets daily.  We will plan to see the patient back in 3 weeks for repeat labs and evaluation of her cough.  Plan is for observation with a restaging CT scan  of the chest every 3 months.  She will continue Hycodan for her cough.  Refill was provided to her today.  She was advised to call immediately if she has any concerning symptoms in the interval. The patient voices understanding of current disease status and treatment options and is in agreement with the current care plan. All questions were answered. The patient knows to call the clinic with any problems, questions or concerns. We can certainly see the patient much sooner if necessary.  Orders Placed This Encounter  Procedures  . CBC with Differential/Platelet    Standing Status:   Future    Standing Expiration Date:   01/26/2018  . Comprehensive metabolic panel    Standing Status:   Future    Standing Expiration Date:   01/26/2018    Nicole Bussing, DNP, AGPCNP-BC, AOCNP 01/26/17   ADDENDUM: Hematology/Oncology Attending: I had a face-to-face encounter with the patient.  I recommended her care plan.  This is a very pleasant 77 years old Asian female who came to the clinic today accompanied by her daughter-in-law for follow-up visit.  The patient has a stage IIIa non-small cell lung cancer, adenocarcinoma status post a course of concurrent chemoradiation with weekly carboplatin and paclitaxel with partial response and she is currently on treatment with consolidation immunotherapy was Imfinzi (Durvalumab) status post 5 cycles. She has been tolerating this treatment well except for the persistent dry cough and shortness of breath.  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 She had repeat CT scan of the chest performed recently that showed no clear evidence for disease progression but the scan showed evolving postradiation changes in the right lung obscuring the previously noted right lower lobe pulmonary nodule.  There was no new pulmonary nodules or masses noted.  There was no lymphadenopathy in the  mediastinal or hilar nodal stations. I personally and independently reviewed the scan images and discussed the results with the patient and her daughter-in-law. The patient is still symptomatic from her dry cough and shortness of breath.  I gave her the option of discontinuing the consolidation immunotherapy with Imfinzi (Durvalumab) for now and starting the patient on a taper dose of prednisone for the persistent pneumonitis.  She will come back for follow-up visit in 3 weeks for reevaluation of the pneumonitis. We will also see her back for follow-up visit in 3 months with repeat CT scan of the chest for restaging of her disease. If the patient has any evidence for disease progression in the future, she would be consider for treatment again either with immunotherapy, targeted therapy or systemic chemotherapy. The patient and her daughter-in-law agreed to the current plan. She was advised to call immediately if she has any concerning symptoms in  the interval.  Disclaimer: This note was dictated with voice recognition software. Similar sounding words can inadvertently be transcribed and may be missed upon review. Eilleen Kempf, MD 01/27/17

## 2017-01-26 NOTE — Assessment & Plan Note (Addendum)
This is a very pleasant 77 year old Asian female recently diagnosed with a stage IIIa non-small cell lung cancer, adenocarcinoma. She underwent a course of concurrent chemoradiation with weekly carboplatin and paclitaxel status post 7 cycles. The patient tolerated the previous course fairly well except for mild odynophagia and dry cough. The patient is currently undergoing consolidation treatment with immunotherapy with Imfinzi (Durvalumab) status post 5 cycles. She tolerated her treatment fairly well except for ongoing nonproductive cough and shortness of breath.  The patient was seen with Dr. Julien Nordmann.  Patient had a recent restaging CT scan.  Images were shown to the patient and her daughter-in-law.  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  The pneumonitis is likely the cause of her cough.  Options were discussed including continuing her current treatment of Imfinzi and managing the cough with Hycodan versus stopping Imfinzi and treating her with high-dose steroids to help with inflammation and reduce her cough.  After lengthy discussion, the patient is agreeable to proceeding with steroids and stopping the Imfinzi.  A prescription for prednisone 70 mg daily times 1 week, then 60 mg daily times 1 week, then 50 mg daily times 1 week, then 40 mg daily times 1 week, and then 30 mg daily times 1 week, and then 20 mg daily times 1 week, and then 10 mg daily times 1 week was prescribed.  She was reminded to take her prednisone with food and to start over-the-counter omeprazole 1-2 tablets daily.  We will plan to see the patient back in 3 weeks for repeat labs and evaluation of her cough.  Plan is for observation with a restaging CT scan of the chest every 3 months.  She will continue Hycodan for her cough.  Refill was provided to her today.  She was advised to call immediately if she has any concerning symptoms in the  interval. The patient voices understanding of current disease status and treatment options and is in agreement with the current care plan. All questions were answered. The patient knows to call the clinic with any problems, questions or concerns. We can certainly see the patient much sooner if necessary.

## 2017-01-31 ENCOUNTER — Telehealth: Payer: Self-pay | Admitting: Internal Medicine

## 2017-01-31 NOTE — Telephone Encounter (Signed)
Called regarding 1/24

## 2017-02-02 ENCOUNTER — Ambulatory Visit: Payer: Medicare Other | Admitting: Internal Medicine

## 2017-02-02 ENCOUNTER — Encounter: Payer: Self-pay | Admitting: Internal Medicine

## 2017-02-02 ENCOUNTER — Other Ambulatory Visit (INDEPENDENT_AMBULATORY_CARE_PROVIDER_SITE_OTHER): Payer: Medicare Other

## 2017-02-02 VITALS — BP 164/90 | HR 97 | Temp 99.0°F | Resp 18 | Ht 68.0 in | Wt 166.8 lb

## 2017-02-02 DIAGNOSIS — R0609 Other forms of dyspnea: Secondary | ICD-10-CM

## 2017-02-02 DIAGNOSIS — I10 Essential (primary) hypertension: Secondary | ICD-10-CM

## 2017-02-02 DIAGNOSIS — R0600 Dyspnea, unspecified: Secondary | ICD-10-CM

## 2017-02-02 DIAGNOSIS — I483 Typical atrial flutter: Secondary | ICD-10-CM | POA: Diagnosis not present

## 2017-02-02 DIAGNOSIS — E781 Pure hyperglyceridemia: Secondary | ICD-10-CM

## 2017-02-02 DIAGNOSIS — R9431 Abnormal electrocardiogram [ECG] [EKG]: Secondary | ICD-10-CM | POA: Diagnosis not present

## 2017-02-02 DIAGNOSIS — E118 Type 2 diabetes mellitus with unspecified complications: Secondary | ICD-10-CM

## 2017-02-02 LAB — CBC WITH DIFFERENTIAL/PLATELET
Basophils Absolute: 0 10*3/uL (ref 0.0–0.1)
Basophils Relative: 0.2 % (ref 0.0–3.0)
Eosinophils Absolute: 0 10*3/uL (ref 0.0–0.7)
Eosinophils Relative: 0 % (ref 0.0–5.0)
HCT: 43.2 % (ref 36.0–46.0)
Hemoglobin: 14.1 g/dL (ref 12.0–15.0)
Lymphocytes Relative: 4.7 % — ABNORMAL LOW (ref 12.0–46.0)
Lymphs Abs: 0.4 10*3/uL — ABNORMAL LOW (ref 0.7–4.0)
MCHC: 32.6 g/dL (ref 30.0–36.0)
MCV: 87.2 fl (ref 78.0–100.0)
Monocytes Absolute: 0.3 10*3/uL (ref 0.1–1.0)
Monocytes Relative: 3.3 % (ref 3.0–12.0)
Neutro Abs: 8.6 10*3/uL — ABNORMAL HIGH (ref 1.4–7.7)
Neutrophils Relative %: 91.8 % — ABNORMAL HIGH (ref 43.0–77.0)
Platelets: 343 10*3/uL (ref 150.0–400.0)
RBC: 4.96 Mil/uL (ref 3.87–5.11)
RDW: 15.1 % (ref 11.5–15.5)
WBC: 9.4 10*3/uL (ref 4.0–10.5)

## 2017-02-02 LAB — TROPONIN I: TNIDX: 0.01 ug/l (ref 0.00–0.06)

## 2017-02-02 LAB — POCT GLYCOSYLATED HEMOGLOBIN (HGB A1C): Hemoglobin A1C: 8.1

## 2017-02-02 NOTE — Progress Notes (Signed)
Subjective:  Patient ID: Nicole [name], female    DOB: [birth year]  Age: 77 y.o. MRN: [id number]  CC: Hypertension; Hyperlipidemia; Diabetes; and Shortness of Breath   HPI Nicole [name] presents for f/up -she complains that she has had a few episodes of shortness of breath at rest and with exertion over the last few weeks.  She denies chest pain, lightheadedness, dizziness, palpitations, edema, or fatigue.  Outpatient Medications Prior to Visit  Medication Sig Dispense Refill  . COLLAGEN PO Take by mouth.    . diltiazem (CARDIZEM CD) 120 MG 24 hr capsule Take 1 capsule (120 mg total) by mouth daily. 30 capsule 11  . ELIQUIS 5 MG TABS tablet take 1 tablet by mouth twice a day 60 tablet 10  . HYDROcodone-homatropine (HYCODAN) 5-1.5 MG/5ML syrup Take 5 mLs by mouth every 6 (six) hours as needed for cough. 120 mL 0  . Lactobacillus (PROBIOTIC ACIDOPHILUS PO) Take by mouth.    . Multiple Vitamins-Minerals (MULTIVITAMIN WITH MINERALS) tablet Take 1 tablet by mouth daily.    . predniSONE (DELTASONE) 10 MG tablet Take 7 tabs (70 mg) daily X 1 week, then 6 tabs (60 mg) daily X 1 week, then 5 tabs (50 mg daily) X 1 week, then 4 tabs (40 mg) daily X 1 week, then 3 tabs (30 mg) daily X 1 week, then 2 tabs (20 mg) daily X 1 week, then 1 tab (10 mg) daily X 1 week, then stop. 200 tablet 0  . chlorpheniramine-HYDROcodone (TUSSIONEX) 10-8 MG/5ML SUER Take 5 mLs by mouth at bedtime as needed for cough. 115 mL 0   No facility-administered medications prior to visit.     ROS Review of Systems  Constitutional: Negative for appetite change, chills, diaphoresis and fatigue.  HENT: Negative.  Negative for trouble swallowing.   Eyes: Negative for visual disturbance.  Respiratory: Positive for shortness of breath. Negative for cough, chest tightness and wheezing.   Cardiovascular: Negative for chest pain, palpitations and leg swelling.  Gastrointestinal: Negative for abdominal pain, constipation, diarrhea,  nausea and vomiting.  Endocrine: Negative.  Negative for polydipsia, polyphagia and polyuria.  Genitourinary: Negative.  Negative for difficulty urinating.  Musculoskeletal: Negative.  Negative for back pain, myalgias and neck pain.  Skin: Negative.  Negative for color change and rash.  Neurological: Negative.  Negative for dizziness, syncope, weakness, light-headedness and numbness.  Hematological: Negative for adenopathy. Does not bruise/bleed easily.  Psychiatric/Behavioral: Negative.     Objective:  BP (!) 164/90 (BP Location: Left Arm, Patient Position: Sitting, Cuff Size: Normal)   Pulse 97   Temp 99 F (37.2 C) (Oral)   Resp 18   Ht 5\' 8"  (1.727 m)   Wt 166 lb 12.8 oz (75.7 kg)   SpO2 97%   BMI 25.36 kg/m   BP Readings from Last 3 Encounters:  02/02/17 (!) 164/90  01/26/17 131/82  01/12/17 (!) 142/80    Wt Readings from Last 3 Encounters:  02/02/17 166 lb 12.8 oz (75.7 kg)  01/26/17 166 lb 14.4 oz (75.7 kg)  01/12/17 165 lb 1.6 oz (74.9 kg)    Physical Exam  Constitutional: She is oriented to person, place, and time. No distress.  HENT:  Mouth/Throat: Oropharynx is clear and moist. No oropharyngeal exudate.  Eyes: Conjunctivae are normal. Left eye exhibits no discharge. No scleral icterus.  Neck: Normal range of motion. Neck supple. No JVD present. No thyromegaly present.  Cardiovascular: Normal rate, regular rhythm and normal heart sounds. Exam reveals  no gallop and no friction rub.  No murmur heard. EKG -  Sinus  Rhythm  -Left atrial enlargement.   -  Negative precordial T-waves.   BORDERLINE- T wave changes in V1 and V2 are a little more prominent  Pulmonary/Chest: Effort normal and breath sounds normal. No respiratory distress. She has no wheezes. She has no rales.  Abdominal: Soft. Bowel sounds are normal. She exhibits no mass. There is no tenderness. There is no guarding.  Musculoskeletal: Normal range of motion. She exhibits no edema or tenderness.    Neurological: She is alert and oriented to person, place, and time.  Skin: Skin is warm and dry. No rash noted. She is not diaphoretic. No erythema. No pallor.  Psychiatric: She has a normal mood and affect. Her behavior is normal. Judgment and thought content normal.  Vitals reviewed.   Lab Results  Component Value Date   WBC 9.4 02/02/2017   HGB 14.1 02/02/2017   HCT 43.2 02/02/2017   PLT 343.0 02/02/2017   GLUCOSE 299 (H) 02/02/2017   GLUCOSE 299 (H) 02/02/2017   CHOL 203 (H) 02/02/2017   TRIG 147.0 02/02/2017   HDL 61.10 02/02/2017   LDLDIRECT 74.0 06/14/2016   LDLCALC 113 (H) 02/02/2017   ALT 10 02/02/2017   AST 8 02/02/2017   NA 135 02/02/2017   NA 135 02/02/2017   K 4.0 02/02/2017   K 4.0 02/02/2017   CL 97 02/02/2017   CL 97 02/02/2017   CREATININE 1.00 02/02/2017   CREATININE 1.00 02/02/2017   BUN 25 (H) 02/02/2017   BUN 25 (H) 02/02/2017   CO2 28 02/02/2017   CO2 28 02/02/2017   TSH 0.16 (L) 02/02/2017   INR 1.0 07/06/2016   HGBA1C 8.1 02/02/2017   MICROALBUR 4.0 (H) 06/14/2016    Ct Chest W Contrast  Result Date: 01/25/2017 CLINICAL DATA:  77 year old female with history of lung cancer with increasing cough and dyspnea. Currently undergoing immunotherapy. Restaging examination. EXAM: CT CHEST WITH CONTRAST TECHNIQUE: Multidetector CT imaging of the chest was performed during intravenous contrast administration. CONTRAST:  21mL ISOVUE-300 IOPAMIDOL (ISOVUE-300) INJECTION 61% COMPARISON:  Chest CT 10/10/2016. FINDINGS: Cardiovascular: Heart size is normal. There is no significant pericardial fluid, thickening or pericardial calcification. There is aortic atherosclerosis, as well as atherosclerosis of the great vessels of the mediastinum and the coronary arteries, including calcified atherosclerotic plaque in the left main, left anterior descending, left circumflex and right coronary arteries. Calcifications of the aortic valve and mitral annulus. Mediastinum/Nodes:  No pathologically enlarged mediastinal or hilar lymph nodes. Esophagus is unremarkable in appearance. No axillary lymphadenopathy. Lungs/Pleura: Previously noted right lower lobe nodule is no longer confidently identified. There is a are now all extensive areas of mass-like consolidation throughout the right lung, most evident in the perihilar and paramediastinal aspects, likely to reflect evolving post-radiation changes. No consolidative airspace disease in the left lung. A few scattered tiny 2-3 mm pulmonary nodules are noted, but are nonspecific and are similar to prior examinations. Trace right pleural effusion lying dependently. Upper Abdomen: Status post cholecystectomy. Musculoskeletal: There are no aggressive appearing lytic or blastic lesions noted in the visualized portions of the skeleton. IMPRESSION: 1. Evolving postradiation changes in the right lung now obscuring the previously noted right lower lobe pulmonary nodule. No new pulmonary nodules or masses are noted. No lymphadenopathy in the mediastinal or hilar nodal stations. 2. Aortic atherosclerosis, in addition to left main and 3 vessel coronary artery disease. Assessment for potential risk factor modification, dietary  therapy or pharmacologic therapy may be warranted, if clinically indicated. 3. There are calcifications of the aortic valve and mitral annulus. Echocardiographic correlation for evaluation of potential valvular dysfunction may be warranted if clinically indicated. Aortic Atherosclerosis (ICD10-I70.0). Electronically Signed   By: Vinnie Langton M.D.   On: 01/25/2017 16:04    Assessment & Plan:   Nicole [name] was seen today for hypertension, hyperlipidemia, diabetes and shortness of breath.  Diagnoses and all orders for this visit:  Type 2 diabetes mellitus with complication, without long-term current use of insulin (HCC)-0.1%.  She is not willing, at this time, to start a medication for blood sugar control.  She agrees to work on  lifestyle modifications. -     POCT HgB A1C -     Comprehensive metabolic panel; Future  Essential hypertension- Her blood pressure is not adequately well controlled but considering her symptoms and comorbid illnesses will allow for a slightly higher than acceptable blood pressure for now. -     EKG 12-Lead -     CBC with Differential/Platelet; Future -     Comprehensive metabolic panel; Future  Hypertriglyceridemia-improvement noted -     Lipid panel; Future  DOE (dyspnea on exertion)- She has DOE but no chest pain.  Her EKG shows minimal changes in V1 and V2.  Her BNP is reassuring that she does not have fluid overload or heart failure.  Her troponin is negative.  She does have significant risk factors for CAD so I have asked her to undergo a myocardial perfusion imaging. -     CBC with Differential/Platelet; Future -     Brain natriuretic peptide; Future -     Troponin I; Future -     MYOCARDIAL PERFUSION IMAGING; Future  Nonspecific abnormal electrocardiogram (ECG) (EKG)- as above -     Brain natriuretic peptide; Future -     Troponin I; Future -     MYOCARDIAL PERFUSION IMAGING; Future  Abnormal electrocardiogram -     MYOCARDIAL PERFUSION IMAGING; Future   I have discontinued Nicole [name]'s chlorpheniramine-HYDROcodone. I am also having her maintain her multivitamin with minerals, diltiazem, ELIQUIS, Lactobacillus (PROBIOTIC ACIDOPHILUS PO), COLLAGEN PO, HYDROcodone-homatropine, and predniSONE.  No orders of the defined types were placed in this encounter.    Follow-up: No Follow-up on file.  Nicole Calico, MD

## 2017-02-03 ENCOUNTER — Encounter: Payer: Self-pay | Admitting: Internal Medicine

## 2017-02-03 LAB — THYROID PANEL WITH TSH
Free Thyroxine Index: 2.4 (ref 1.4–3.8)
T3 Uptake: 36 % — ABNORMAL HIGH (ref 22–35)
T4, Total: 6.6 ug/dL (ref 5.1–11.9)
TSH: 0.16 mIU/L — ABNORMAL LOW (ref 0.40–4.50)

## 2017-02-03 LAB — COMPREHENSIVE METABOLIC PANEL
ALT: 10 U/L (ref 0–35)
AST: 8 U/L (ref 0–37)
Albumin: 3.5 g/dL (ref 3.5–5.2)
Alkaline Phosphatase: 67 U/L (ref 39–117)
BUN: 25 mg/dL — ABNORMAL HIGH (ref 6–23)
CO2: 28 mEq/L (ref 19–32)
Calcium: 9 mg/dL (ref 8.4–10.5)
Chloride: 97 mEq/L (ref 96–112)
Creatinine, Ser: 1 mg/dL (ref 0.40–1.20)
GFR: 57.57 mL/min — ABNORMAL LOW (ref >60.00)
Glucose, Bld: 299 mg/dL — ABNORMAL HIGH (ref 70–99)
Potassium: 4 mEq/L (ref 3.5–5.1)
Sodium: 135 mEq/L (ref 135–145)
Total Bilirubin: 0.4 mg/dL (ref 0.2–1.2)
Total Protein: 7.5 g/dL (ref 6.0–8.3)

## 2017-02-03 LAB — BASIC METABOLIC PANEL
BUN: 25 mg/dL — ABNORMAL HIGH (ref 6–23)
CO2: 28 mEq/L (ref 19–32)
Calcium: 9 mg/dL (ref 8.4–10.5)
Chloride: 97 mEq/L (ref 96–112)
Creatinine, Ser: 1 mg/dL (ref 0.40–1.20)
GFR: 57.57 mL/min — ABNORMAL LOW (ref >60.00)
Glucose, Bld: 299 mg/dL — ABNORMAL HIGH (ref 70–99)
Potassium: 4 mEq/L (ref 3.5–5.1)
Sodium: 135 mEq/L (ref 135–145)

## 2017-02-03 LAB — BRAIN NATRIURETIC PEPTIDE: Pro B Natriuretic peptide (BNP): 33 pg/mL (ref 0.0–100.0)

## 2017-02-03 LAB — LIPID PANEL
Cholesterol: 203 mg/dL — ABNORMAL HIGH (ref 0–200)
HDL: 61.1 mg/dL (ref >39.00)
LDL Cholesterol: 113 mg/dL — ABNORMAL HIGH (ref 0–99)
NonHDL: 142.27
Total CHOL/HDL Ratio: 3
Triglycerides: 147 mg/dL (ref 0.0–149.0)
VLDL: 29.4 mg/dL (ref 0.0–40.0)

## 2017-02-03 NOTE — Telephone Encounter (Signed)
Copied from Bureau [id number]. Topic: Quick Communication - See Telephone Encounter >> Feb 03, 2017  3:31 PM Percell Belt A wrote: CRM for notification. See Telephone encounter for: pt daughter in law called in and said that Dr Ronnald Ramp was going to call in new bp med for pt?  They said that the pharmacy never rec'd it?  .  Pharmacy- [facility] [place]  Phone [phone]  02/03/17.

## 2017-02-06 ENCOUNTER — Encounter: Payer: Self-pay | Admitting: Internal Medicine

## 2017-02-06 MED ORDER — IRBESARTAN 300 MG PO TABS: 300.0000 mg | ORAL_TABLET | Freq: Every day | ORAL | 0 refills | Status: DC

## 2017-02-06 NOTE — Addendum Note (Signed)
Addended by: [name] on: 02/06/2017 09:57 AM   Modules accepted: Orders

## 2017-02-06 NOTE — Patient Instructions (Signed)

## 2017-02-07 ENCOUNTER — Telehealth (HOSPITAL_COMMUNITY): Payer: Self-pay

## 2017-02-07 NOTE — Telephone Encounter (Signed)
Encounter complete. 

## 2017-02-09 ENCOUNTER — Other Ambulatory Visit: Payer: Medicare Other

## 2017-02-09 ENCOUNTER — Ambulatory Visit: Payer: Medicare Other

## 2017-02-09 ENCOUNTER — Ambulatory Visit (HOSPITAL_COMMUNITY)
Admission: RE | Admit: 2017-02-09 | Discharge: 2017-02-09 | Disposition: A | Discharge status: Home / Self Care | Payer: Medicare Other | Source: Ambulatory Visit | Attending: Cardiology | Admitting: Cardiology

## 2017-02-09 ENCOUNTER — Ambulatory Visit: Payer: Medicare Other | Admitting: Oncology

## 2017-02-09 DIAGNOSIS — R0609 Other forms of dyspnea: Secondary | ICD-10-CM | POA: Insufficient documentation

## 2017-02-09 DIAGNOSIS — R0600 Dyspnea, unspecified: Secondary | ICD-10-CM

## 2017-02-09 DIAGNOSIS — R9431 Abnormal electrocardiogram [ECG] [EKG]: Secondary | ICD-10-CM | POA: Diagnosis not present

## 2017-02-09 MED ORDER — TECHNETIUM TC 99M TETROFOSMIN IV KIT
10.7000 | PACK | Freq: Once | INTRAVENOUS | Status: AC | PRN
  Administered 2017-02-09: 10.7 via INTRAVENOUS
  Filled 2017-02-09: qty 11

## 2017-02-09 MED ORDER — REGADENOSON 0.4 MG/5ML IV SOLN
0.4000 mg | Freq: Once | INTRAVENOUS | Status: AC
  Administered 2017-02-09: 0.4 mg via INTRAVENOUS

## 2017-02-09 MED ORDER — TECHNETIUM TC 99M TETROFOSMIN IV KIT
30.1000 | PACK | Freq: Once | INTRAVENOUS | Status: AC | PRN
  Administered 2017-02-09: 30.1 via INTRAVENOUS
  Filled 2017-02-09: qty 31

## 2017-02-13 ENCOUNTER — Encounter: Payer: Self-pay | Admitting: Internal Medicine

## 2017-02-13 LAB — MYOCARDIAL PERFUSION IMAGING
LV dias vol: 67 mL (ref 46–106)
LV sys vol: 23 mL
Peak HR: 105 {beats}/min
Rest HR: 88 {beats}/min
SDS: 5
SRS: 2
SSS: 7
TID: 1.44

## 2017-02-15 ENCOUNTER — Other Ambulatory Visit: Payer: Self-pay | Admitting: Medical Oncology

## 2017-02-15 DIAGNOSIS — C3491 Malignant neoplasm of unspecified part of right bronchus or lung: Secondary | ICD-10-CM

## 2017-02-16 ENCOUNTER — Inpatient Hospital Stay (HOSPITAL_BASED_OUTPATIENT_CLINIC_OR_DEPARTMENT_OTHER): Payer: Medicare Other | Admitting: Internal Medicine

## 2017-02-16 ENCOUNTER — Encounter: Payer: Self-pay | Admitting: Internal Medicine

## 2017-02-16 ENCOUNTER — Ambulatory Visit: Payer: Medicare Other | Admitting: Internal Medicine

## 2017-02-16 ENCOUNTER — Telehealth: Payer: Self-pay | Admitting: *Deleted

## 2017-02-16 ENCOUNTER — Inpatient Hospital Stay: Payer: Medicare Other

## 2017-02-16 ENCOUNTER — Telehealth: Payer: Self-pay | Admitting: Internal Medicine

## 2017-02-16 VITALS — BP 164/92 | HR 100 | Temp 97.8°F | Resp 19 | Wt 166.1 lb

## 2017-02-16 VITALS — BP 138/84 | HR 99 | Temp 97.8°F | Resp 16 | Ht 68.0 in | Wt 166.0 lb

## 2017-02-16 DIAGNOSIS — R5383 Other fatigue: Secondary | ICD-10-CM

## 2017-02-16 DIAGNOSIS — R05 Cough: Secondary | ICD-10-CM

## 2017-02-16 DIAGNOSIS — Z7952 Long term (current) use of systemic steroids: Secondary | ICD-10-CM

## 2017-02-16 DIAGNOSIS — C3491 Malignant neoplasm of unspecified part of right bronchus or lung: Secondary | ICD-10-CM

## 2017-02-16 DIAGNOSIS — R053 Chronic cough: Secondary | ICD-10-CM

## 2017-02-16 DIAGNOSIS — I10 Essential (primary) hypertension: Secondary | ICD-10-CM

## 2017-02-16 DIAGNOSIS — R0609 Other forms of dyspnea: Secondary | ICD-10-CM

## 2017-02-16 DIAGNOSIS — M1990 Unspecified osteoarthritis, unspecified site: Secondary | ICD-10-CM | POA: Diagnosis not present

## 2017-02-16 DIAGNOSIS — I2510 Atherosclerotic heart disease of native coronary artery without angina pectoris: Secondary | ICD-10-CM

## 2017-02-16 DIAGNOSIS — E118 Type 2 diabetes mellitus with unspecified complications: Secondary | ICD-10-CM

## 2017-02-16 DIAGNOSIS — Z794 Long term (current) use of insulin: Secondary | ICD-10-CM | POA: Diagnosis not present

## 2017-02-16 DIAGNOSIS — Z9221 Personal history of antineoplastic chemotherapy: Secondary | ICD-10-CM | POA: Diagnosis not present

## 2017-02-16 DIAGNOSIS — Z9225 Personal history of immunosupression therapy: Secondary | ICD-10-CM | POA: Diagnosis not present

## 2017-02-16 DIAGNOSIS — Z79899 Other long term (current) drug therapy: Secondary | ICD-10-CM | POA: Diagnosis not present

## 2017-02-16 DIAGNOSIS — I700 Atherosclerosis of aorta: Secondary | ICD-10-CM | POA: Diagnosis not present

## 2017-02-16 DIAGNOSIS — Z8701 Personal history of pneumonia (recurrent): Secondary | ICD-10-CM

## 2017-02-16 LAB — CMP (CANCER CENTER ONLY)
ALT: 18 U/L (ref 0–55)
AST: 9 U/L (ref 5–34)
Albumin: 3 g/dL — ABNORMAL LOW (ref 3.5–5.0)
Alkaline Phosphatase: 103 U/L (ref 40–150)
Anion gap: 11 (ref 3–11)
BUN: 37 mg/dL — ABNORMAL HIGH (ref 7–26)
CO2: 22 mmol/L (ref 22–29)
Calcium: 8.8 mg/dL (ref 8.4–10.4)
Chloride: 101 mmol/L (ref 98–109)
Creatinine: 1.09 mg/dL (ref 0.60–1.10)
GFR, Est AFR Am: 57 mL/min — ABNORMAL LOW (ref >60)
GFR, Estimated: 49 mL/min — ABNORMAL LOW (ref >60)
Glucose, Bld: 434 mg/dL — ABNORMAL HIGH (ref 70–140)
Potassium: 4.3 mmol/L (ref 3.3–4.7)
Sodium: 134 mmol/L — ABNORMAL LOW (ref 136–145)
Total Bilirubin: 0.8 mg/dL (ref 0.2–1.2)
Total Protein: 6.4 g/dL (ref 6.4–8.3)

## 2017-02-16 LAB — CBC WITH DIFFERENTIAL (CANCER CENTER ONLY)
Basophils Absolute: 0 10*3/uL (ref 0.0–0.1)
Basophils Relative: 0 %
Eosinophils Absolute: 0 10*3/uL (ref 0.0–0.5)
Eosinophils Relative: 0 %
HCT: 42.4 % (ref 34.8–46.6)
Hemoglobin: 14 g/dL (ref 11.6–15.9)
Lymphocytes Relative: 2 %
Lymphs Abs: 0.2 10*3/uL — ABNORMAL LOW (ref 0.9–3.3)
MCH: 28.6 pg (ref 25.1–34.0)
MCHC: 33 g/dL (ref 31.5–36.0)
MCV: 86.7 fL (ref 79.5–101.0)
Monocytes Absolute: 0.3 10*3/uL (ref 0.1–0.9)
Monocytes Relative: 4 %
Neutro Abs: 8.1 10*3/uL — ABNORMAL HIGH (ref 1.5–6.5)
Neutrophils Relative %: 94 %
Platelet Count: 189 10*3/uL (ref 145–400)
RBC: 4.89 MIL/uL (ref 3.70–5.45)
RDW: 15.6 % (ref 11.2–16.1)
WBC Count: 8.6 10*3/uL (ref 3.9–10.3)

## 2017-02-16 MED ORDER — BENZONATATE 100 MG PO CAPS: 200.0000 mg | ORAL_CAPSULE | Freq: Three times a day (TID) | ORAL | 3 refills | Status: DC | PRN

## 2017-02-16 MED ORDER — GLUCOSE BLOOD VI STRP: ORAL_STRIP | 12 refills | Status: DC

## 2017-02-16 MED ORDER — EMPAGLIFLOZIN-METFORMIN HCL ER 12.5-1000 MG PO TB24: 1.0000 | ORAL_TABLET | Freq: Every morning | ORAL | 1 refills | Status: DC

## 2017-02-16 MED ORDER — ACCU-CHEK GUIDE W/DEVICE KIT: 1.0000 | PACK | Freq: Three times a day (TID) | 0 refills | Status: DC

## 2017-02-16 MED ORDER — ACCU-CHEK MULTICLIX LANCETS MISC: 12 refills | Status: DC

## 2017-02-16 MED ORDER — BASAGLAR KWIKPEN 100 UNIT/ML ~~LOC~~ SOPN: 30.0000 [IU] | PEN_INJECTOR | Freq: Every day | SUBCUTANEOUS | 0 refills | Status: DC

## 2017-02-16 MED ORDER — HYDROCODONE-HOMATROPINE 5-1.5 MG/5ML PO SYRP: 5.0000 mL | ORAL_SOLUTION | Freq: Four times a day (QID) | ORAL | 0 refills | Status: DC | PRN

## 2017-02-16 NOTE — Patient Instructions (Signed)

## 2017-02-16 NOTE — Telephone Encounter (Signed)
Scheduled appt per 1/24 los - Patient is aware of appt date and time.

## 2017-02-16 NOTE — Telephone Encounter (Signed)
Call to pt -instructed pt per MD to check sugar and take all prescribed medications. Pt to call PCP Dr. Scarlette Calico regarding glucose of 434. Pt verbalized understanding will call PCP for instructions. Call placed to Dr. Scarlette Calico office lm with [facility] with above information and requested pt be called with further instructions.

## 2017-02-16 NOTE — Progress Notes (Signed)
[facility] Telephone:([phone]   Fax:([phone]  OFFICE PROGRESS NOTE  [name], MD [address] [facility] [address] [place] [address]  DIAGNOSIS: Stage IIIa (T1b, N2, M0) non-small cell lung cancer, adenocarcinoma presented with right lower lobe lung mass in addition to right hilar and subcarinal lymphadenopathy diagnosed in June 2018.  Biomarker Findings Tumor Mutational Burden - TMB-Intermediate (8 Muts/Mb) Microsatellite Status - Cannot Be Determined Genomic Findings For a complete list of the genes assayed, please refer to the Appendix. EGFR E709K, G719S MLJ4GB E010O FHQ1F7 splice site 588-3G>P 7 Disease relevant genes with no reportable alterations: KRAS, ALK, BRAF, MET, ERBB2, RET, ROS1  PDL1 Expression 70%.  PRIOR THERAPY: 1) Concurrent chemoradiation with weekly carboplatin for AUC of 2 and paclitaxel 45 MG/M2. Status post 7 cycles with partial response. 2) Consolidation treatment with immunotherapy with Imfinzi (Durvalumab) 10 MG/KG every 2 weeks, first dose 10/19/2016.  Status post 5 cycles.  Discontinued secondary to intolerance and persistent pneumonitis.  CURRENT THERAPY: Observation.  INTERVAL HISTORY: Nicole [name] 77 y.o. female returns to the clinic today for follow-up visit.  The patient is feeling a little bit better today.  Her treatment with Imfinzi (Durvalumab) was discontinued a few weeks ago secondary to persistent cough and pneumonitis.  She was a started on a taper dose of prednisone.  She is currently on 50 mg p.o. daily.  The patient denied having any current chest pain but continues to have dry cough and shortness of breath with exertion with no hemoptysis.  She denied having any fever or chills.  She has no nausea, vomiting, diarrhea or constipation.  She is here today for evaluation and repeat blood work.  MEDICAL HISTORY: Past Medical History:  Diagnosis Date  . Adenocarcinoma of right lung, stage 3 ([place])  07/28/2016  . Bronchitis    hx of  . 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 Diabetes mellitus    type 2  . Gallstones   . Headache   . History of radiation therapy 08/09/2016 to 09/19/2016   Right lung was treated to 60 Gy in 30 fractions at 2 Gy per fraction  . Hypertension   . Low back pain   . Lung mass    with lymphadenopathy  . Osteoarthritis     ALLERGIES:  is allergic to no known allergies.  MEDICATIONS:  Current Outpatient Medications  Medication Sig Dispense Refill  . chlorpheniramine-HYDROcodone (TUSSIONEX) 10-8 MG/5ML SUER take 5 milliliters by mouth at bedtime if needed for cough  0  . COLLAGEN PO Take by mouth.    . diltiazem (CARDIZEM CD) 120 MG 24 hr capsule Take 1 capsule (120 mg total) by mouth daily. 30 capsule 11  . ELIQUIS 5 MG TABS tablet take 1 tablet by mouth twice a day 60 tablet 10  . HYDROcodone-homatropine (HYCODAN) 5-1.5 MG/5ML syrup Take 5 mLs by mouth every 6 (six) hours as needed for cough. 120 mL 0  . irbesartan (AVAPRO) 300 MG tablet Take 1 tablet (300 mg total) by mouth daily. 90 tablet 0  . Lactobacillus (PROBIOTIC ACIDOPHILUS PO) Take by mouth.    . Multiple Vitamins-Minerals (MULTIVITAMIN WITH MINERALS) tablet Take 1 tablet by mouth daily.    . predniSONE (DELTASONE) 10 MG tablet Take 7 tabs (70 mg) daily X 1 week, then 6 tabs (60 mg) daily X 1 week, then 5 tabs (50 mg daily) X 1 week, then 4 tabs (40 mg) daily X 1  week, then 3 tabs (30 mg) daily X 1 week, then 2 tabs (20 mg) daily X 1 week, then 1 tab (10 mg) daily X 1 week, then stop. 200 tablet 0   No current facility-administered medications for this visit.     SURGICAL HISTORY:  Past Surgical History:  Procedure Laterality Date  . ABDOMINAL HYSTERECTOMY  2004  . APPENDECTOMY  when 77 years old  . COLONOSCOPY  08-05-09   [name]  . INCONTINENCE SURGERY    . KNEE ARTHROSCOPY Left   . LEFT HEART CATHETERIZATION WITH CORONARY ANGIOGRAM N/A 07/19/2013   Procedure: LEFT HEART  CATHETERIZATION WITH CORONARY ANGIOGRAM;  Surgeon: Sinclair Grooms, MD;  Location: [facility] CATH LAB;  Service: Cardiovascular;  Laterality: N/A;  . POLYPECTOMY  08-05-09   2 polyps  . TOTAL KNEE ARTHROPLASTY Left 03/28/2012   Procedure: LEFT TOTAL KNEE ARTHROPLASTY;  Surgeon: Tobi Bastos, MD;  Location: [facility] ORS;  Service: Orthopedics;  Laterality: Left;  . TUBAL LIGATION    . VIDEO BRONCHOSCOPY WITH ENDOBRONCHIAL ULTRASOUND N/A 07/11/2016   Procedure: VIDEO BRONCHOSCOPY WITH ENDOBRONCHIAL ULTRASOUND;  Surgeon: Marshell Garfinkel, MD;  Location: [place];  Service: Pulmonary;  Laterality: N/A;    REVIEW OF SYSTEMS:  A comprehensive review of systems was negative except for: Constitutional: positive for fatigue Respiratory: positive for cough and dyspnea on exertion   PHYSICAL EXAMINATION: General appearance: alert, cooperative, fatigued and no distress Head: Normocephalic, without obvious abnormality, atraumatic Neck: no adenopathy, no JVD, supple, symmetrical, trachea midline and thyroid not enlarged, symmetric, no tenderness/mass/nodules Lymph nodes: Cervical, supraclavicular, and axillary nodes normal. Resp: clear to auscultation bilaterally Back: symmetric, no curvature. ROM normal. No CVA tenderness. Cardio: regular rate and rhythm, S1, S2 normal, no murmur, click, rub or gallop GI: soft, non-tender; bowel sounds normal; no masses,  no organomegaly Extremities: extremities normal, atraumatic, no cyanosis or edema  ECOG PERFORMANCE STATUS: 1 - Symptomatic but completely ambulatory  There were no vitals taken for this visit.  LABORATORY DATA: Lab Results  Component Value Date   WBC 8.6 02/16/2017   HGB 14.1 02/02/2017   HCT 42.4 02/16/2017   MCV 86.7 02/16/2017   PLT 189 02/16/2017      Chemistry      Component Value Date/Time   NA 135 02/02/2017 1709   NA 135 02/02/2017 1709   NA 139 01/26/2017 0915   K 4.0 02/02/2017 1709   K 4.0 02/02/2017 1709   K 4.1 01/26/2017 0915   CL  97 02/02/2017 1709   CL 97 02/02/2017 1709   CO2 28 02/02/2017 1709   CO2 28 02/02/2017 1709   CO2 24 01/26/2017 0915   BUN 25 (H) 02/02/2017 1709   BUN 25 (H) 02/02/2017 1709   BUN 22.0 01/26/2017 0915   CREATININE 1.00 02/02/2017 1709   CREATININE 1.00 02/02/2017 1709   CREATININE 0.9 01/26/2017 0915      Component Value Date/Time   CALCIUM 9.0 02/02/2017 1709   CALCIUM 9.0 02/02/2017 1709   CALCIUM 9.2 01/26/2017 0915   ALKPHOS 67 02/02/2017 1709   ALKPHOS 72 01/26/2017 0915   AST 8 02/02/2017 1709   AST 13 01/26/2017 0915   ALT 10 02/02/2017 1709   ALT 12 01/26/2017 0915   BILITOT 0.4 02/02/2017 1709   BILITOT 0.37 01/26/2017 0915       RADIOGRAPHIC STUDIES: Ct Chest W Contrast  Result Date: 01/25/2017 CLINICAL DATA:  77 year old female with history of lung cancer with increasing cough and dyspnea. Currently  undergoing immunotherapy. Restaging examination. EXAM: CT CHEST WITH CONTRAST TECHNIQUE: Multidetector CT imaging of the chest was performed during intravenous contrast administration. CONTRAST:  28m ISOVUE-300 IOPAMIDOL (ISOVUE-300) INJECTION 61% COMPARISON:  Chest CT 10/10/2016. FINDINGS: Cardiovascular: Heart size is normal. There is no significant pericardial fluid, thickening or pericardial calcification. There is aortic atherosclerosis, as well as atherosclerosis of the great vessels of the mediastinum and the coronary arteries, including calcified atherosclerotic plaque in the left main, left anterior descending, left circumflex and right coronary arteries. Calcifications of the aortic valve and mitral annulus. Mediastinum/Nodes: No pathologically enlarged mediastinal or hilar lymph nodes. Esophagus is unremarkable in appearance. No axillary lymphadenopathy. Lungs/Pleura: Previously noted right lower lobe nodule is no longer confidently identified. There is a are now all extensive areas of mass-like consolidation throughout the right lung, most evident in the perihilar  and paramediastinal aspects, likely to reflect evolving post-radiation changes. No consolidative airspace disease in the left lung. A few scattered tiny 2-3 mm pulmonary nodules are noted, but are nonspecific and are similar to prior examinations. Trace right pleural effusion lying dependently. Upper Abdomen: Status post cholecystectomy. Musculoskeletal: There are no aggressive appearing lytic or blastic lesions noted in the visualized portions of the skeleton. IMPRESSION: 1. Evolving postradiation changes in the right lung now obscuring the previously noted right lower lobe pulmonary nodule. No new pulmonary nodules or masses are noted. No lymphadenopathy in the mediastinal or hilar nodal stations. 2. Aortic atherosclerosis, in addition to left main and 3 vessel coronary artery disease. Assessment for potential risk factor modification, dietary therapy or pharmacologic therapy may be warranted, if clinically indicated. 3. There are calcifications of the aortic valve and mitral annulus. Echocardiographic correlation for evaluation of potential valvular dysfunction may be warranted if clinically indicated. Aortic Atherosclerosis (ICD10-I70.0). Electronically Signed   By: DVinnie LangtonM.D.   On: 01/25/2017 16:04    ASSESSMENT AND PLAN: This is a very pleasant 77years old Asian female recently diagnosed with a stage IIIa non-small cell lung cancer, adenocarcinoma. She underwent a course of concurrent chemoradiation with weekly carboplatin and paclitaxel status post 7 cycles. The patient tolerated the previous course fairly well except for mild odynophagia and dry cough. The patient was a started on consolidation treatment with immunotherapy with Imfinzi (Durvalumab) status post 5 cycles.  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 The patient was a started on a taper dose of prednisone and she is currently on 50 mg p.o. Daily. I recommended for her to continue with a taper dose of prednisone  as a schedule. I will see her back for follow-up visit in 4 weeks for reevaluation and repeat blood work. She is expected to have repeat imaging studies in 3 months. The patient was advised to call immediately if she has any concerning symptoms in the interval. The patient voices understanding of current disease status and treatment options and is in agreement with the current care plan. All questions were answered. The patient knows to call the clinic with any problems, questions or concerns. We can certainly see the patient much sooner if necessary.  Disclaimer: This note was dictated with voice recognition software. Similar sounding words can inadvertently be transcribed and may not be corrected upon review.

## 2017-02-16 NOTE — Progress Notes (Signed)
Subjective:  Patient ID: Nicole [name], female    DOB: 1940/12/15  Age: 77 y.o. 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  She has tried multiple narcotic cough suppressants without much relief from her symptoms.  She tells me she has not tried [employer]  She also complains that her blood sugar has been above 400 for the last day or 2.  She is taking prednisone for the treatment of lung cancer and its complications.  Outpatient Medications Prior to Visit  Medication Sig Dispense Refill  . COLLAGEN PO Take 1 tablet by mouth daily.     [name] ELIQUIS 5 MG TABS tablet take 1 tablet by mouth twice a day 60 tablet 10  . irbesartan (AVAPRO) 300 MG tablet Take 1 tablet (300 mg total) by mouth daily. 90 tablet 0  . Multiple Vitamins-Minerals (MULTIVITAMIN WITH MINERALS) tablet Take 1 tablet by mouth daily.    . predniSONE (DELTASONE) 10 MG tablet Take 7 tabs (70 mg) daily X 1 week, then 6 tabs (60 mg) daily X 1 week, then 5 tabs (50 mg daily) X 1 week, then 4 tabs (40 mg) daily X 1 week, then 3 tabs (30 mg) daily X 1 week, then 2 tabs (20 mg) daily X 1 week, then 1 tab (10 mg) daily X 1 week, then stop. 200 tablet 0  . chlorpheniramine-HYDROcodone (TUSSIONEX) 10-8 MG/5ML SUER take 5 milliliters by mouth at bedtime if needed for cough  0  . diltiazem (CARDIZEM CD) 120 MG 24 hr capsule Take 1 capsule (120 mg total) by mouth daily. (Patient not taking: Reported on 02/16/2017) 30 capsule 11  . HYDROcodone-homatropine (HYCODAN) 5-1.5 MG/5ML syrup Take 5 mLs by mouth every 6 (six) hours as needed for cough. 120 mL 0  . Lactobacillus (PROBIOTIC ACIDOPHILUS PO) Take 1 tablet by mouth daily.      No facility-administered medications prior to visit.     ROS Review of Systems  Constitutional: Negative for activity change, appetite change, chills, diaphoresis,  fatigue and fever.  HENT: Negative.  Negative for trouble swallowing.   Eyes: Negative for visual disturbance.  Respiratory: Positive for cough. Negative for chest tightness, shortness of breath and wheezing.   Cardiovascular: Negative for chest pain, palpitations and leg swelling.  Gastrointestinal: Negative for abdominal pain, constipation, diarrhea, nausea and vomiting.  Endocrine: Positive for polydipsia and polyuria. Negative for polyphagia.  Genitourinary: Positive for frequency. Negative for difficulty urinating, dysuria, hematuria and urgency.  Musculoskeletal: Negative.  Negative for back pain and myalgias.  Skin: Negative.   Allergic/Immunologic: Negative.   Neurological: Negative.  Negative for dizziness, weakness and light-headedness.  Hematological: Negative for adenopathy. Does not bruise/bleed easily.  Psychiatric/Behavioral: Negative.     Objective:  BP 138/84 (BP Location: Left Arm, Patient Position: Sitting, Cuff Size: Small)   Pulse 99   Temp 97.8 F (36.6 C) (Oral)   Resp 16   Ht 5' 8"  (1.727 m)   Wt 166 lb 0.6 oz (75.3 kg)   SpO2 96%   BMI 25.25 kg/m   BP Readings from Last 3 Encounters:  02/18/17 103/60  02/16/17 138/84  02/16/17 (!) 164/92    Wt Readings from Last 3 Encounters:  02/16/17 166 lb 0.6 oz (75.3 kg)  02/16/17 166 lb 2 oz (75.4 kg)  02/09/17 166 lb (75.3 kg)    Physical Exam  Constitutional: She is oriented to person, place, and time. No distress.  HENT:  Mouth/Throat: Oropharynx is clear and moist. No oropharyngeal exudate.  Eyes: Conjunctivae are normal. Left eye exhibits no discharge. No scleral icterus.  Neck: Normal range of motion. Neck supple. No JVD present. No thyromegaly present.  Cardiovascular: Normal rate, regular rhythm and normal heart sounds. Exam reveals no gallop.  No murmur heard. Pulmonary/Chest: Effort normal and breath sounds normal. No respiratory distress. She has no wheezes. She has no rales.  Abdominal: Soft.  Bowel sounds are normal. She exhibits no distension and no mass. There is no tenderness.  Musculoskeletal: Normal range of motion. She exhibits no edema or tenderness.  Lymphadenopathy:    She has no cervical adenopathy.  Neurological: She is alert and oriented to person, place, and time.  Skin: Skin is warm and dry. No rash noted. She is not diaphoretic. No erythema. No pallor.  Vitals reviewed.   Lab Results  Component Value Date   WBC 10.9 (H) 02/17/2017   HGB 14.5 02/17/2017   HCT 43.8 02/17/2017   PLT 203 02/17/2017   GLUCOSE 439 (H) 02/17/2017   CHOL 203 (H) 02/02/2017   TRIG 147.0 02/02/2017   HDL 61.10 02/02/2017   LDLDIRECT 74.0 06/14/2016   LDLCALC 113 (H) 02/02/2017   ALT 18 02/16/2017   AST 9 02/16/2017   NA 133 (L) 02/17/2017   K 4.3 02/17/2017   CL 103 02/17/2017   CREATININE 1.20 (H) 02/17/2017   BUN 47 (H) 02/17/2017   CO2 19 (L) 02/17/2017   TSH 0.16 (L) 02/02/2017   INR 1.0 07/06/2016   HGBA1C 8.1 02/02/2017   MICROALBUR 4.0 (H) 06/14/2016    No results found.  Assessment & Plan:   Nicole [name] was seen today for cough and diabetes.  Diagnoses and all orders for this visit:  Chronic cough- She will try Tessalon Perles. -     benzonatate (TESSALON) 100 MG capsule; Take 2 capsules (200 mg total) by mouth 3 (three) times daily as needed for cough.  Type 2 diabetes mellitus with complication, with long-term current use of insulin ([place])- Her recent A1c was 8.1% and her blood sugar is above 400 today.  Will start basal insulin, she was given her first dose in the office today.  Will also start an SGLT-2 inhibitor and metformin. -     Insulin Glargine (BASAGLAR KWIKPEN) 100 UNIT/ML SOPN; Inject 0.3 mLs (30 Units total) into the skin at bedtime. -     Empagliflozin-Metformin HCl ER (SYNJARDY XR) 12.05-998 MG TB24; Take 1 tablet by mouth every morning. -     Blood Glucose Monitoring Suppl (ACCU-CHEK GUIDE) w/Device KIT; 1 Act by Does not apply route 3 (three) times  daily. -     Discontinue: glucose blood (ACCU-CHEK GUIDE) test strip; Use TID -     Lancets (ACCU-CHEK MULTICLIX) lancets; Use TID -     Consult to [place] Management -     Amb Referral to Nutrition and Diabetic E -     glucose blood (ACCU-CHEK GUIDE) test strip; Use TID to check blood sugar. DX: E11.9   I have discontinued Nethra K. Delmonico's chlorpheniramine-HYDROcodone. I have also changed her glucose blood. Additionally, I am having her start on benzonatate, BASAGLAR KWIKPEN, Empagliflozin-Metformin HCl ER, ACCU-CHEK GUIDE, and accu-chek multiclix. Lastly, I am having her maintain her multivitamin with minerals, diltiazem, ELIQUIS, Lactobacillus (PROBIOTIC ACIDOPHILUS PO), COLLAGEN PO, predniSONE, irbesartan, and HYDROcodone-homatropine.  Meds ordered this encounter  Medications  .  benzonatate (TESSALON) 100 MG capsule    Sig: Take 2 capsules (200 mg total) by mouth 3 (three) times daily as needed for cough.    Dispense:  180 capsule    Refill:  3  . Insulin Glargine (BASAGLAR KWIKPEN) 100 UNIT/ML SOPN    Sig: Inject 0.3 mLs (30 Units total) into the skin at bedtime.    Dispense:  9 mL    Refill:  0  . Empagliflozin-Metformin HCl ER (SYNJARDY XR) 12.05-998 MG TB24    Sig: Take 1 tablet by mouth every morning.    Dispense:  90 tablet    Refill:  1  . Blood Glucose Monitoring Suppl (ACCU-CHEK GUIDE) w/Device KIT    Sig: 1 Act by Does not apply route 3 (three) times daily.    Dispense:  2 kit    Refill:  0  . DISCONTD: glucose blood (ACCU-CHEK GUIDE) test strip    Sig: Use TID    Dispense:  100 each    Refill:  12  . Lancets (ACCU-CHEK MULTICLIX) lancets    Sig: Use TID    Dispense:  100 each    Refill:  12  . glucose blood (ACCU-CHEK GUIDE) test strip    Sig: Use TID to check blood sugar. DX: E11.9    Dispense:  100 each    Refill:  12    Dispense Accu Check or Free Style please.     Follow-up: No Follow-up on file.  Scarlette Calico, MD

## 2017-02-17 ENCOUNTER — Emergency Department (HOSPITAL_COMMUNITY)
Admission: EM | Admit: 2017-02-17 | Discharge: 2017-02-18 | Disposition: A | Discharge status: Home / Self Care | Payer: Medicare Other | Attending: Emergency Medicine | Admitting: Emergency Medicine

## 2017-02-17 ENCOUNTER — Ambulatory Visit: Payer: Self-pay | Admitting: *Deleted

## 2017-02-17 ENCOUNTER — Emergency Department (HOSPITAL_COMMUNITY): Payer: Medicare Other

## 2017-02-17 ENCOUNTER — Other Ambulatory Visit: Payer: Self-pay

## 2017-02-17 ENCOUNTER — Encounter (HOSPITAL_COMMUNITY): Payer: Self-pay

## 2017-02-17 DIAGNOSIS — Z79899 Other long term (current) drug therapy: Secondary | ICD-10-CM | POA: Diagnosis not present

## 2017-02-17 DIAGNOSIS — I10 Essential (primary) hypertension: Secondary | ICD-10-CM | POA: Insufficient documentation

## 2017-02-17 DIAGNOSIS — R918 Other nonspecific abnormal finding of lung field: Secondary | ICD-10-CM | POA: Diagnosis not present

## 2017-02-17 DIAGNOSIS — R739 Hyperglycemia, unspecified: Secondary | ICD-10-CM

## 2017-02-17 DIAGNOSIS — Z96652 Presence of left artificial knee joint: Secondary | ICD-10-CM | POA: Insufficient documentation

## 2017-02-17 DIAGNOSIS — I4891 Unspecified atrial fibrillation: Secondary | ICD-10-CM | POA: Diagnosis not present

## 2017-02-17 DIAGNOSIS — E1165 Type 2 diabetes mellitus with hyperglycemia: Secondary | ICD-10-CM | POA: Insufficient documentation

## 2017-02-17 HISTORY — DX: Unspecified atrial fibrillation: I48.91

## 2017-02-17 LAB — CBC
HCT: 43.8 % (ref 36.0–46.0)
Hemoglobin: 14.5 g/dL (ref 12.0–15.0)
MCH: 28.6 pg (ref 26.0–34.0)
MCHC: 33.1 g/dL (ref 30.0–36.0)
MCV: 86.4 fL (ref 78.0–100.0)
Platelets: 203 10*3/uL (ref 150–400)
RBC: 5.07 MIL/uL (ref 3.87–5.11)
RDW: 15.9 % — ABNORMAL HIGH (ref 11.5–15.5)
WBC: 10.9 10*3/uL — ABNORMAL HIGH (ref 4.0–10.5)

## 2017-02-17 LAB — BASIC METABOLIC PANEL
Anion gap: 11 (ref 5–15)
BUN: 47 mg/dL — ABNORMAL HIGH (ref 6–20)
CO2: 19 mmol/L — ABNORMAL LOW (ref 22–32)
Calcium: 8.3 mg/dL — ABNORMAL LOW (ref 8.9–10.3)
Chloride: 103 mmol/L (ref 101–111)
Creatinine, Ser: 1.2 mg/dL — ABNORMAL HIGH (ref 0.44–1.00)
GFR calc Af Amer: 50 mL/min — ABNORMAL LOW (ref >60)
GFR calc non Af Amer: 43 mL/min — ABNORMAL LOW (ref >60)
Glucose, Bld: 439 mg/dL — ABNORMAL HIGH (ref 65–99)
Potassium: 4.3 mmol/L (ref 3.5–5.1)
Sodium: 133 mmol/L — ABNORMAL LOW (ref 135–145)

## 2017-02-17 LAB — CBG MONITORING, ED
Glucose-Capillary: 386 mg/dL — ABNORMAL HIGH (ref 65–99)
Glucose-Capillary: 376 mg/dL — ABNORMAL HIGH (ref 65–99)

## 2017-02-17 MED ORDER — SODIUM CHLORIDE 0.9 % IV BOLUS (SEPSIS)
1000.0000 mL | Freq: Once | INTRAVENOUS | Status: AC
  Administered 2017-02-18: 1000 mL via INTRAVENOUS

## 2017-02-17 MED ORDER — INSULIN ASPART 100 UNIT/ML ~~LOC~~ SOLN
8.0000 [IU] | Freq: Once | SUBCUTANEOUS | Status: AC
  Administered 2017-02-18: 8 [IU] via INTRAVENOUS
  Filled 2017-02-17: qty 1

## 2017-02-17 NOTE — ED Triage Notes (Signed)
PT reports she was started on insulin yesterday d/t high dose steroid use and is continuing to have high BG with insulin use. PT reports cbg a home was 495-500 multiple times yesterday and today. She reports using 30 units of basaglar at bedtime. Denies having any cp, n/v, confusion. Endorses continued sob after chemo tx but states this has been baseline.

## 2017-02-17 NOTE — ED Notes (Signed)
Daughter to nurse first and reports pt is having increased SOB.  RN to speak with pt and pt reports increased SOB all day.  Pt took back to triage for EKG and labs.

## 2017-02-17 NOTE — ED Notes (Signed)
Pt reports increased SOB

## 2017-02-17 NOTE — ED Notes (Signed)
Checked CBG 376, RN Jessica informed

## 2017-02-17 NOTE — ED Provider Notes (Signed)
[place] EMERGENCY DEPARTMENT Provider Note   CSN: [id number] Arrival date & time: 02/17/17  1626     History   Chief Complaint Chief Complaint  Patient presents with  . Hyperglycemia    HPI Nicole [name] is a 77 y.o. female.  Patient is a 77 year old female with past medical history of diabetes, atrial fibrillation, and adenocarcinoma of the right lung.  She is status post chemotherapy and radiation.  She presents today for evaluation of elevated blood sugar.  She has been off of her diabetes medicines due to her cancer treatment for many months.  She was recently started on a course of steroids for radiation injury to her lung.  Since starting this medication, her sugars have been running higher than normal.  She had a blood sugar of nearly 500 2 days ago.  She does report some weakness, increased thirst, and increased urination.   The history is provided by the patient.  Hyperglycemia  Blood sugar level PTA:  400 Severity:  Moderate Onset quality:  Gradual Duration:  3 days Timing:  Constant Progression:  Worsening Chronicity:  New Diabetes status:  Controlled with oral medications Context: change in medication   Relieved by:  Nothing Ineffective treatments:  None tried   Past Medical History:  Diagnosis Date  . A-fib ([place])   . Adenocarcinoma of right lung, stage 3 ([place]) 07/28/2016  . Bronchitis    hx of  . 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 Diabetes mellitus    type 2  . Gallstones   . Headache   . History of radiation therapy 08/09/2016 to 09/19/2016   Right lung was treated to 60 Gy in 30 fractions at 2 Gy per fraction  . Hypertension   . Low back pain   . Lung mass    with lymphadenopathy  . Osteoarthritis     Patient Active Problem List   Diagnosis Date Noted  . Nonspecific abnormal electrocardiogram (ECG) (EKG) 02/02/2017  . 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 Chronic cough 01/13/2017  . Encounter for antineoplastic  immunotherapy 10/12/2016  . Cancer-related pain 10/06/2016  . Atrial flutter ([place]) 10/05/2016  . Adenocarcinoma of right lung, stage 3 ([place]) 07/28/2016  . Encounter for antineoplastic chemotherapy 07/28/2016  . Goals of care, counseling/discussion 07/28/2016  . Pancreatic cyst 11/09/2015  . Estrogen deficiency 11/09/2015  . Routine general medical examination at a health care facility 06/30/2014  . Gout 01/18/2013  . Hypertriglyceridemia 08/22/2012  . Osteopenia 10/31/2011  . Type II diabetes mellitus with manifestations ([place]) 01/20/2009  . Essential hypertension 01/20/2009  . OSTEOARTHRITIS 01/20/2009    Past Surgical History:  Procedure Laterality Date  . ABDOMINAL HYSTERECTOMY  2004  . APPENDECTOMY  when 77 years old  . COLONOSCOPY  08-05-09   [name]  . INCONTINENCE SURGERY    . KNEE ARTHROSCOPY Left   . LEFT HEART CATHETERIZATION WITH CORONARY ANGIOGRAM N/A 07/19/2013   Procedure: LEFT HEART CATHETERIZATION WITH CORONARY ANGIOGRAM;  Surgeon: Sinclair Grooms, MD;  [facility] CATH LAB;  Service: Cardiovascular;  Laterality: N/A;  . POLYPECTOMY  08-05-09   2 polyps  . TOTAL KNEE ARTHROPLASTY Left 03/28/2012   Procedure: LEFT TOTAL KNEE ARTHROPLASTY;  Surgeon: Tobi Bastos, MD;  Location: [facility] ORS;  Service: Orthopedics;  Laterality: Left;  . TUBAL LIGATION    . VIDEO BRONCHOSCOPY WITH ENDOBRONCHIAL ULTRASOUND N/A 07/11/2016   Procedure: VIDEO BRONCHOSCOPY WITH ENDOBRONCHIAL ULTRASOUND;  Surgeon: Marshell Garfinkel, MD;  Location: [facility];  Service: Pulmonary;  Laterality: N/A;    OB History    No data available       Home Medications    Prior to Admission medications   Medication Sig Start Date End Date Taking? Authorizing Provider  benzonatate (TESSALON) 100 MG capsule Take 2 capsules (200 mg total) by mouth 3 (three) times daily as needed for cough. 02/16/17   [name], MD  Blood Glucose Monitoring Suppl (ACCU-CHEK GUIDE) w/Device KIT 1 Act by Does not apply  route 3 (three) times daily. 02/16/17   [name], MD  COLLAGEN PO Take by mouth.    [provider]  diltiazem (CARDIZEM CD) 120 MG 24 hr capsule Take 1 capsule (120 mg total) by mouth daily. Patient not taking: Reported on 02/16/2017 10/05/16   Jettie Booze, MD  ELIQUIS 5 MG TABS tablet take 1 tablet by mouth twice a day 10/25/16   Jettie Booze, MD  Empagliflozin-Metformin HCl ER (SYNJARDY XR) 12.05-998 MG TB24 Take 1 tablet by mouth every morning. 02/16/17   [name], MD  glucose blood (ACCU-CHEK GUIDE) test strip Use TID to check blood sugar. DX: E11.9 02/16/17   [name], MD  HYDROcodone-homatropine [facility]) 5-1.5 MG/5ML syrup Take 5 mLs by mouth every 6 (six) hours as needed for cough. 02/16/17   Curt Bears, MD  Insulin Glargine (BASAGLAR KWIKPEN) 100 UNIT/ML SOPN Inject 0.3 mLs (30 Units total) into the skin at bedtime. 02/16/17   [name], MD  irbesartan (AVAPRO) 300 MG tablet Take 1 tablet (300 mg total) by mouth daily. 02/06/17   [name], MD  Lactobacillus (PROBIOTIC ACIDOPHILUS PO) Take by mouth.    [provider]  Lancets (ACCU-CHEK MULTICLIX) lancets Use TID 02/16/17   [name], MD  Multiple Vitamins-Minerals (MULTIVITAMIN WITH MINERALS) tablet Take 1 tablet by mouth daily.    [provider]  predniSONE (DELTASONE) 10 MG tablet Take 7 tabs (70 mg) daily X 1 week, then 6 tabs (60 mg) daily X 1 week, then 5 tabs (50 mg daily) X 1 week, then 4 tabs (40 mg) daily X 1 week, then 3 tabs (30 mg) daily X 1 week, then 2 tabs (20 mg) daily X 1 week, then 1 tab (10 mg) daily X 1 week, then stop. 01/26/17   Maryanna Shape, NP    Family History Family History  Problem Relation Age of Onset  . Heart attack Father   . Heart disease Father   . Arthritis Other   . Cancer Other        colon, lst degree relative  . Diabetes Other        st degree relative  . Hyperlipidemia Other   . Hypertension Other   . Colon  cancer Neg Hx     Social History Social History   Tobacco Use  . Smoking status: Never Smoker  . Smokeless tobacco: Never Used  Substance Use Topics  . Alcohol use: No    Alcohol/week: 0.0 oz  . Drug use: No     Allergies   No known allergies   Review of Systems Review of Systems  All other systems reviewed and are negative.    Physical Exam Updated Vital Signs BP 100/69 (BP Location: Left Arm)   Pulse 69   Temp 97.6 F (36.4 C) (Oral)   Resp 18   SpO2 97%   Physical Exam  Constitutional: She is oriented to person, place, and time.  She appears well-developed and well-nourished. No distress.  HENT:  Head: Normocephalic and atraumatic.  Neck: Normal range of motion. Neck supple.  Cardiovascular: Normal rate and regular rhythm. Exam reveals no gallop and no friction rub.  No murmur heard. Pulmonary/Chest: Effort normal and breath sounds normal. No respiratory distress. She has no wheezes.  Abdominal: Soft. Bowel sounds are normal. She exhibits no distension. There is no tenderness.  Musculoskeletal: Normal range of motion.  Neurological: She is alert and oriented to person, place, and time.  Skin: Skin is warm and dry. She is not diaphoretic.  Nursing note and vitals reviewed.    ED Treatments / Results  Labs (all labs ordered are listed, but only abnormal results are displayed) Labs Reviewed  BASIC METABOLIC PANEL - Abnormal; Notable for the following components:      Result Value   Sodium 133 (*)    CO2 19 (*)    Glucose, Bld 439 (*)    BUN 47 (*)    Creatinine, Ser 1.20 (*)    Calcium 8.3 (*)    GFR calc non Af Amer 43 (*)    GFR calc Af Amer 50 (*)    All other components within normal limits  CBC - Abnormal; Notable for the following components:   WBC 10.9 (*)    RDW 15.9 (*)    All other components within normal limits  CBG MONITORING, ED - Abnormal; Notable for the following components:   Glucose-Capillary 386 (*)    All other components  within normal limits  CBG MONITORING, ED - Abnormal; Notable for the following components:   Glucose-Capillary 376 (*)    All other components within normal limits  URINALYSIS, ROUTINE W REFLEX MICROSCOPIC    EKG  EKG Interpretation None       Radiology Dg Chest 2 View  Result Date: 02/17/2017 CLINICAL DATA:  Lung cancer and hyperglycemia EXAM: CHEST  2 VIEW COMPARISON:  Chest CT 01/25/2017 FINDINGS: Right parahilar opacities correspond to the abnormality present on the recent chest CT, possibly due to prior radiation therapy. Cardiomediastinal contours are normal. Mild calcific aortic atherosclerosis. No focal consolidation or pulmonary edema aside from the previously described. No pneumothorax or pleural effusion. IMPRESSION: Right parahilar opacities corresponding to abnormality seen on recent chest CT, possibly radiation change. Electronically Signed   By: Ulyses Jarred M.D.   On: 02/17/2017 20:32    Procedures Procedures (including critical care time)  Medications Ordered in ED Medications  sodium chloride 0.9 % bolus 1,000 mL (not administered)  insulin aspart (novoLOG) injection 8 Units (not administered)     Initial Impression / Assessment and Plan / ED Course  I have reviewed the triage vital signs and the nursing notes.  Pertinent labs & imaging results that were available during my care of the patient were reviewed by me and considered in my medical decision making (see chart for details).  Patient with elevated blood sugar.  She is being treated with steroids for radiation induced pneumonitis.  She was started yesterday on metformin, however her sugar remains elevated.  She was given IV fluids and insulin and her sugar is now 80.  I see no indication for further workup.  I will advise her to continue her medication and to keep a record of her blood sugars so she can take this with her to her next doctor's appointment to adjust her medications accordingly.  Final  Clinical Impressions(s) / ED Diagnoses   Final diagnoses:  None  ED Discharge Orders    None       Veryl Speak, MD 02/18/17 [phone]

## 2017-02-17 NOTE — ED Notes (Signed)
Pt remains in waiting room. Updated on wait for treatment room. 

## 2017-02-17 NOTE — ED Notes (Addendum)
Pt reports SOB.

## 2017-02-17 NOTE — ED Notes (Signed)
Pt called for triage no answer  °

## 2017-02-17 NOTE — Telephone Encounter (Signed)
Pt seen by Dr. Ronnald Ramp yesterday. Prescribed Glargine 30units q HS. CBG at that time 435.  Reports CBG 209 at 6am this morning, 455 at lunch. States gave insulin injection at 1445 today (scheduled for HS) CBG 1 hour later 495. Was prescribed Empagliflozin-Metformin HCL 12.5-1000mg  but has not taken yet. States on steroids.  Pt checked CBG again during call 'Too high' per device. Pt reports SOB, respirations sound rapid during call. Pt directed to ED. Daughter in law will drive.  Reason for Disposition . Blood glucose > 500 mg/dl (27.5 mmol/l)    Device reads 'High' during call...  Readings of 455 and 495 prior to triage  Answer Assessment - Initial Assessment Questions 1. BLOOD GLUCOSE: "What is your blood glucose level?"      Reading 'Too high'  Was 495 2. ONSET: "When did you check the blood glucose?"     495 prior to call.Marland KitchenMarland Kitchen'Too high' per device during call 3. USUAL RANGE: "What is your glucose level usually?" (e.g., usual fasting morning value, usual evening value)     New onset 4. KETONES: "Do you check for ketones (urine or blood test strips)?" If yes, ask: "What does the test show now?"      No 5. TYPE 1 or 2:  "Do you know what type of diabetes you have?"  (e.g., Type 1, Type 2, Gestational; doesn't know)       6. INSULIN: "Do you take insulin?" If yes, ask: "Have you missed any shots recently?"     Yes, new DX. Started yesterday. On steroids H/O lung ca. 7. DIABETES PILLS: "Do you take any pills for your diabetes?" If yes, ask: "Have you missed taking any pills recently?"     No 8. OTHER SYMPTOMS: "Do you have any symptoms?" (e.g., fever, frequent urination, difficulty breathing, dizziness, weakness, vomiting)     SOB, sounds rapid during triage call 9. PREGNANCY: "Is there any chance you are pregnant?" "When was your last menstrual period?"     no  Protocols used: DIABETES - HIGH BLOOD SUGAR-A-AH

## 2017-02-17 NOTE — ED Notes (Signed)
Pt reports using her our glucose monitor to check blood glucose levels, Pt reports blood glucose to be well over 400.

## 2017-02-18 LAB — URINALYSIS, ROUTINE W REFLEX MICROSCOPIC
Bacteria, UA: NONE SEEN
Bilirubin Urine: NEGATIVE
Glucose, UA: >=500 mg/dL — AB
Hgb urine dipstick: NEGATIVE
Ketones, ur: NEGATIVE mg/dL
Nitrite: NEGATIVE
Protein, ur: NEGATIVE mg/dL
Specific Gravity, Urine: 1.025 (ref 1.005–1.030)
pH: 5 (ref 5.0–8.0)

## 2017-02-18 LAB — CBG MONITORING, ED: Glucose-Capillary: 72 mg/dL (ref 65–99)

## 2017-02-18 NOTE — Discharge Instructions (Signed)
Continue metformin as prescribed.  Keep a record of your blood sugars over the next several days and follow-up with your doctor next week to discuss medication adjustments as needed.  Return to the ER if symptoms significantly worsen or change.

## 2017-02-20 ENCOUNTER — Encounter: Payer: Self-pay | Admitting: Internal Medicine

## 2017-02-20 ENCOUNTER — Other Ambulatory Visit: Payer: Self-pay | Admitting: Internal Medicine

## 2017-02-20 DIAGNOSIS — R9439 Abnormal result of other cardiovascular function study: Secondary | ICD-10-CM

## 2017-02-20 LAB — CBG MONITORING, ED: Glucose-Capillary: 150 mg/dL — ABNORMAL HIGH (ref 65–99)

## 2017-02-20 NOTE — Telephone Encounter (Signed)
Pt seen in ED 

## 2017-02-21 ENCOUNTER — Encounter: Payer: Self-pay | Admitting: Internal Medicine

## 2017-02-21 ENCOUNTER — Other Ambulatory Visit: Payer: Self-pay | Admitting: *Deleted

## 2017-02-21 ENCOUNTER — Ambulatory Visit: Payer: Medicare Other | Admitting: Internal Medicine

## 2017-02-21 VITALS — BP 138/80 | HR 98 | Ht 68.5 in | Wt 167.0 lb

## 2017-02-21 DIAGNOSIS — R9439 Abnormal result of other cardiovascular function study: Secondary | ICD-10-CM

## 2017-02-21 DIAGNOSIS — I2510 Atherosclerotic heart disease of native coronary artery without angina pectoris: Secondary | ICD-10-CM | POA: Insufficient documentation

## 2017-02-21 DIAGNOSIS — R0602 Shortness of breath: Secondary | ICD-10-CM | POA: Diagnosis not present

## 2017-02-21 DIAGNOSIS — I25118 Atherosclerotic heart disease of native coronary artery with other forms of angina pectoris: Secondary | ICD-10-CM | POA: Diagnosis not present

## 2017-02-21 HISTORY — DX: Atherosclerotic heart disease of native coronary artery without angina pectoris: I25.10

## 2017-02-21 NOTE — Patient Instructions (Signed)
   [facility] [address] [place] [address] Dept: [phone] Loc: [phone]  Nicole [name]  02/21/2017  You are scheduled for a Cardiac Catheterization on Friday, February 1 with Dr. Larae Grooms.  1. Please arrive at the [facility] (Main Entrance A) at [facility]: [address] [place], [place] [address] at 8am (two hours before your procedure to ensure your preparation). Free valet parking service is available.   Special note: Every effort is made to have your procedure done on time. Please understand that emergencies sometimes delay scheduled procedures.  2. Diet: Do not eat or drink anything after midnight prior to your procedure except sips of water to take medications.  3. Labs: You will have lab work today January 29  4. Medication instructions in preparation for your procedure:  LAST dose of Eliquis is Tuesday January 29th in the evening.  LAST dose of Empagliflozin-Metformin [facility]) is Wednesday January 30th  Take only HALF DOSE of Insulin Thursday January 31st. DO NOT TAKE insulin the morning of your procedure.  On the morning of your procedure, take Aspirin 81mg  and any morning medicines NOT listed above.  You may use sips of water.  5. Plan for one night stay--bring personal belongings. 6. Bring a current list of your medications and current insurance cards. 7. You MUST have a responsible person to drive you home. 8. Someone MUST be with you the first 24 hours after you arrive home or your discharge will be delayed. 9. Please wear clothes that are easy to get on and off and wear slip-on shoes.  Thank you for allowing [place] to care for you!   -- [facility]

## 2017-02-21 NOTE — Progress Notes (Signed)
OFFICE NOTE  Chief Complaint:  Shortness of breath, follow-up stress test for  Primary Care Physician: Nicole Lima, MD  HPI:  Nicole [name] is a 77 y.o. female with a past medial history significant for stage III lung cancer status post radiation therapy, type 2 diabetes, atrial fibrillation, and coronary artery disease with prior heart catheterization in 2015 that demonstrated 50% RCA and circumflex stenoses and an 80% first diagonal stenosis of a small vessel.  She is a patient of Dr. Irish Lack, saw her in September 2018 for her atrial fibrillation however she does not remember him or the visit.  Recently she has been having some progressive shortness of breath and her primary care provider ordered a nuclear stress test.  She was performed on 02/09/2017 which showed a normal LVEF of 66%, normal perfusion however the study was read as intermediate due to an elevated TID ratio of 1.44.  Nicole [name] reports worsening shortness of breath, particularly over the past several months with cough.  It was felt that she might have a radiation pneumonitis and she was treated with steroids which seems to have helped her cough however she reports shortness of breath which is worse with exertion but also present at rest.  She also has had worsening fatigue and decreased activity tolerance.  He denies any anginal symptoms.  PMHx:  Past Medical History:  Diagnosis Date  . A-fib ([place])   . Adenocarcinoma of right lung, stage 3 ([place]) 07/28/2016  . Bronchitis    hx of  . 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 Diabetes mellitus    type 2  . Gallstones   . Headache   . History of radiation therapy 08/09/2016 to 09/19/2016   Right lung was treated to 60 Gy in 30 fractions at 2 Gy per fraction  . Hypertension   . Low back pain   . Lung mass    with lymphadenopathy  . Osteoarthritis     Past Surgical History:  Procedure Laterality Date  . ABDOMINAL HYSTERECTOMY  2004  . APPENDECTOMY  when 77 years old   . COLONOSCOPY  08-05-09   [name]  . INCONTINENCE SURGERY    . KNEE ARTHROSCOPY Left   . LEFT HEART CATHETERIZATION WITH CORONARY ANGIOGRAM N/A 07/19/2013   Procedure: LEFT HEART CATHETERIZATION WITH CORONARY ANGIOGRAM;  Surgeon: Sinclair Grooms, MD;  Location: [facility] CATH LAB;  Service: Cardiovascular;  Laterality: N/A;  . POLYPECTOMY  08-05-09   2 polyps  . TOTAL KNEE ARTHROPLASTY Left 03/28/2012   Procedure: LEFT TOTAL KNEE ARTHROPLASTY;  Surgeon: Tobi Bastos, MD;  Location: [facility] ORS;  Service: Orthopedics;  Laterality: Left;  . TUBAL LIGATION    . VIDEO BRONCHOSCOPY WITH ENDOBRONCHIAL ULTRASOUND N/A 07/11/2016   Procedure: VIDEO BRONCHOSCOPY WITH ENDOBRONCHIAL ULTRASOUND;  Surgeon: Marshell Garfinkel, MD;  Location: [place];  Service: Pulmonary;  Laterality: N/A;    FAMHx:  Family History  Problem Relation Age of Onset  . Heart attack Father   . Heart disease Father   . Arthritis Other   . Cancer Other        colon, lst degree relative  . Diabetes Other        st degree relative  . Hyperlipidemia Other   . Hypertension Other   . Colon cancer Neg Hx     SOCHx:   reports that  has never smoked. she has never used smokeless tobacco. She reports that she does not drink alcohol or use drugs.  ALLERGIES:  Allergies  Allergen Reactions  . No Known Allergies     ROS: Pertinent items noted in HPI and remainder of comprehensive ROS otherwise negative.  HOME MEDS: Current Outpatient Medications on File Prior to Visit  Medication Sig Dispense Refill  . benzonatate (TESSALON) 100 MG capsule Take 2 capsules (200 mg total) by mouth 3 (three) times daily as needed for cough. 180 capsule 3  . Blood Glucose Monitoring Suppl (ACCU-CHEK GUIDE) w/Device KIT 1 Act by Does not apply route 3 (three) times daily. 2 kit 0  . COLLAGEN PO Take 1 tablet by mouth daily.     [name] diltiazem (CARDIZEM CD) 120 MG 24 hr capsule Take 1 capsule (120 mg total) by mouth daily. 30 capsule 11  . ELIQUIS 5 MG TABS  tablet take 1 tablet by mouth twice a day 60 tablet 10  . Empagliflozin-Metformin HCl ER (SYNJARDY XR) 12.05-998 MG TB24 Take 1 tablet by mouth every morning. 90 tablet 1  . glucose blood (ACCU-CHEK GUIDE) test strip Use TID to check blood sugar. DX: E11.9 100 each 12  . HYDROcodone-homatropine (HYCODAN) 5-1.5 MG/5ML syrup Take 5 mLs by mouth every 6 (six) hours as needed for cough. 120 mL 0  . Insulin Glargine (BASAGLAR KWIKPEN) 100 UNIT/ML SOPN Inject 0.3 mLs (30 Units total) into the skin at bedtime. 9 mL 0  . irbesartan (AVAPRO) 300 MG tablet Take 1 tablet (300 mg total) by mouth daily. 90 tablet 0  . Lactobacillus (PROBIOTIC ACIDOPHILUS PO) Take 1 tablet by mouth daily.     . Lancets (ACCU-CHEK MULTICLIX) lancets Use TID 100 each 12  . Multiple Vitamins-Minerals (MULTIVITAMIN WITH MINERALS) tablet Take 1 tablet by mouth daily.    . predniSONE (DELTASONE) 10 MG tablet Take 7 tabs (70 mg) daily X 1 week, then 6 tabs (60 mg) daily X 1 week, then 5 tabs (50 mg daily) X 1 week, then 4 tabs (40 mg) daily X 1 week, then 3 tabs (30 mg) daily X 1 week, then 2 tabs (20 mg) daily X 1 week, then 1 tab (10 mg) daily X 1 week, then stop. 200 tablet 0   No current facility-administered medications on file prior to visit.     LABS/IMAGING: No results found for this or any previous visit (from the past 48 hour(s)). No results found.  LIPID PANEL:    Component Value Date/Time   CHOL 203 (H) 02/02/2017 1709   TRIG 147.0 02/02/2017 1709   HDL 61.10 02/02/2017 1709   CHOLHDL 3 02/02/2017 1709   VLDL 29.4 02/02/2017 1709   LDLCALC 113 (H) 02/02/2017 1709   LDLDIRECT 74.0 06/14/2016 1356     WEIGHTS: Wt Readings from Last 3 Encounters:  02/21/17 167 lb (75.8 kg)  02/16/17 166 lb 0.6 oz (75.3 kg)  02/16/17 166 lb 2 oz (75.4 kg)    VITALS: BP 138/80   Pulse 98   Ht 5' 8.5" (1.74 m)   Wt 167 lb (75.8 kg)   SpO2 94%   BMI 25.02 kg/m   EXAM: General appearance: alert and no distress Neck: no  carotid bruit, no JVD and thyroid not enlarged, symmetric, no tenderness/mass/nodules Lungs: rales bibasilar Heart: regular rate and rhythm Abdomen: soft, non-tender; bowel sounds normal; no masses,  no organomegaly Extremities: extremities normal, atraumatic, no cyanosis or edema Pulses: 2+ and symmetric Skin: Skin color, texture, turgor normal. No rashes or lesions Neurologic: Grossly normal : Pleasant  EKG: Deferred  ASSESSMENT: 1. Dyspnea on exertion, fatigue  and exercise intolerance 2. CAD with moderate two-vessel coronary disease (2015) 3. Recent stage III adenocarcinoma status post radiation therapy with pneumonitis 4. Chronic cough 5. Abnormal Myoview stress test with TID ratio of 1.44  PLAN: 1.   Mrs. Pickelsimer has had progressive dyspnea on exertion, fatigue and exercise intolerance.  Her symptoms may have improved somewhat with steroid therapy however she still reports shortness of breath at rest which is worse with exertion.  She cannot do normal house chores or activities and her symptoms seem to been worse in the past several months.  Her stress test suggested no ischemia however there is an isolated TID ratio of 1.44.  She has prior two-vessel coronary disease and my question would be whether this could represent multivessel disease.  This certainly could explain her symptoms.  Accordingly, it may also be an isolated finding.  I suspect her shortness of breath may be more related to her pulmonary disease.  Nonetheless, it would be reasonable to repeat her cardiac catheterization to assure no significant progression of her coronary artery disease.  I again discussed the risk, benefits and alternatives with her regarding cardiac catheterization and she is agreeable to proceed.  I saw her in the office today for Dr. Irish Lack - who is her cardiologist. Will arrange for catheterization with him and follow-up with him afterwards.  Pixie Casino, MD, [place], [place] Director of the Advanced Lipid Disorders &  Cardiovascular Risk Reduction Clinic Diplomate of the American Board of Clinical Lipidology Attending Cardiologist  Direct Dial: [phone]  Fax: [phone]  Website:  [email or web address] 02/21/2017, 1:05 PM

## 2017-02-21 NOTE — H&P (View-Only) (Signed)
OFFICE NOTE  Chief Complaint:  Shortness of breath, follow-up stress test for  Primary Care Physician: [name], MD  HPI:  Nicole [name] is a 77 y.o. female with a past medial history significant for stage III lung cancer status post radiation therapy, type 2 diabetes, atrial fibrillation, and coronary artery disease with prior heart catheterization in 2015 that demonstrated 50% RCA and circumflex stenoses and an 80% first diagonal stenosis of a small vessel.  She is a patient of Dr. Irish Lack, saw her in September 2018 for her atrial fibrillation however she does not remember him or the visit.  Recently she has been having some progressive shortness of breath and her primary care provider ordered a nuclear stress test.  She was performed on 02/09/2017 which showed a normal LVEF of 66%, normal perfusion however the study was read as intermediate due to an elevated TID ratio of 1.44.  Nicole [name] reports worsening shortness of breath, particularly over the past several months with cough.  It was felt that she might have a radiation pneumonitis and she was treated with steroids which seems to have helped her cough however she reports shortness of breath which is worse with exertion but also present at rest.  She also has had worsening fatigue and decreased activity tolerance.  He denies any anginal symptoms.  PMHx:  Past Medical History:  Diagnosis Date  . A-fib ([place])   . Adenocarcinoma of right lung, stage 3 ([place]) 07/28/2016  . Bronchitis    hx of  . Cancer [facility]) 2004   uterine/cervical  . Diabetes mellitus    type 2  . Gallstones   . Headache   . History of radiation therapy 08/09/2016 to 09/19/2016   Right lung was treated to 60 Gy in 30 fractions at 2 Gy per fraction  . Hypertension   . Low back pain   . Lung mass    with lymphadenopathy  . Osteoarthritis     Past Surgical History:  Procedure Laterality Date  . ABDOMINAL HYSTERECTOMY  2004  . APPENDECTOMY  when 77 years old   . COLONOSCOPY  08-05-09   [name]  . INCONTINENCE SURGERY    . KNEE ARTHROSCOPY Left   . LEFT HEART CATHETERIZATION WITH CORONARY ANGIOGRAM N/A 07/19/2013   Procedure: LEFT HEART CATHETERIZATION WITH CORONARY ANGIOGRAM;  Surgeon: Sinclair Grooms, MD;  Location: [facility] CATH LAB;  Service: Cardiovascular;  Laterality: N/A;  . POLYPECTOMY  08-05-09   2 polyps  . TOTAL KNEE ARTHROPLASTY Left 03/28/2012   Procedure: LEFT TOTAL KNEE ARTHROPLASTY;  Surgeon: Tobi Bastos, MD;  Location: [facility];  Service: Orthopedics;  Laterality: Left;  . TUBAL LIGATION    . VIDEO BRONCHOSCOPY WITH ENDOBRONCHIAL ULTRASOUND N/A 07/11/2016   Procedure: VIDEO BRONCHOSCOPY WITH ENDOBRONCHIAL ULTRASOUND;  Surgeon: Marshell Garfinkel, MD;  Location: [place];  Service: Pulmonary;  Laterality: N/A;    FAMHx:  Family History  Problem Relation Age of Onset  . Heart attack Father   . Heart disease Father   . Arthritis Other   . Cancer Other        colon, lst degree relative  . Diabetes Other        st degree relative  . Hyperlipidemia Other   . Hypertension Other   . Colon cancer Neg Hx     SOCHx:   reports that  has never smoked. she has never used smokeless tobacco. She reports that she does not drink alcohol or use drugs.  ALLERGIES:  Allergies  Allergen Reactions  . No Known Allergies     ROS: Pertinent items noted in HPI and remainder of comprehensive ROS otherwise negative.  HOME MEDS: Current Outpatient Medications on File Prior to Visit  Medication Sig Dispense Refill  . benzonatate (TESSALON) 100 MG capsule Take 2 capsules (200 mg total) by mouth 3 (three) times daily as needed for cough. 180 capsule 3  . Blood Glucose Monitoring Suppl (ACCU-CHEK GUIDE) w/Device KIT 1 Act by Does not apply route 3 (three) times daily. 2 kit 0  . COLLAGEN PO Take 1 tablet by mouth daily.     [name] diltiazem (CARDIZEM CD) 120 MG 24 hr capsule Take 1 capsule (120 mg total) by mouth daily. 30 capsule 11  . ELIQUIS 5 MG TABS  tablet take 1 tablet by mouth twice a day 60 tablet 10  . Empagliflozin-Metformin HCl ER (SYNJARDY XR) 12.05-998 MG TB24 Take 1 tablet by mouth every morning. 90 tablet 1  . glucose blood (ACCU-CHEK GUIDE) test strip Use TID to check blood sugar. DX: E11.9 100 each 12  . HYDROcodone-homatropine (HYCODAN) 5-1.5 MG/5ML syrup Take 5 mLs by mouth every 6 (six) hours as needed for cough. 120 mL 0  . Insulin Glargine (BASAGLAR KWIKPEN) 100 UNIT/ML SOPN Inject 0.3 mLs (30 Units total) into the skin at bedtime. 9 mL 0  . irbesartan (AVAPRO) 300 MG tablet Take 1 tablet (300 mg total) by mouth daily. 90 tablet 0  . Lactobacillus (PROBIOTIC ACIDOPHILUS PO) Take 1 tablet by mouth daily.     . Lancets (ACCU-CHEK MULTICLIX) lancets Use TID 100 each 12  . Multiple Vitamins-Minerals (MULTIVITAMIN WITH MINERALS) tablet Take 1 tablet by mouth daily.    . predniSONE (DELTASONE) 10 MG tablet Take 7 tabs (70 mg) daily X 1 week, then 6 tabs (60 mg) daily X 1 week, then 5 tabs (50 mg daily) X 1 week, then 4 tabs (40 mg) daily X 1 week, then 3 tabs (30 mg) daily X 1 week, then 2 tabs (20 mg) daily X 1 week, then 1 tab (10 mg) daily X 1 week, then stop. 200 tablet 0   No current facility-administered medications on file prior to visit.     LABS/IMAGING: No results found for this or any previous visit (from the past 48 hour(s)). No results found.  LIPID PANEL:    Component Value Date/Time   CHOL 203 (H) 02/02/2017 1709   TRIG 147.0 02/02/2017 1709   HDL 61.10 02/02/2017 1709   CHOLHDL 3 02/02/2017 1709   VLDL 29.4 02/02/2017 1709   LDLCALC 113 (H) 02/02/2017 1709   LDLDIRECT 74.0 06/14/2016 1356     WEIGHTS: Wt Readings from Last 3 Encounters:  02/21/17 167 lb (75.8 kg)  02/16/17 166 lb 0.6 oz (75.3 kg)  02/16/17 166 lb 2 oz (75.4 kg)    VITALS: BP 138/80   Pulse 98   Ht 5' 8.5" (1.74 m)   Wt 167 lb (75.8 kg)   SpO2 94%   BMI 25.02 kg/m   EXAM: General appearance: alert and no distress Neck: no  carotid bruit, no JVD and thyroid not enlarged, symmetric, no tenderness/mass/nodules Lungs: rales bibasilar Heart: regular rate and rhythm Abdomen: soft, non-tender; bowel sounds normal; no masses,  no organomegaly Extremities: extremities normal, atraumatic, no cyanosis or edema Pulses: 2+ and symmetric Skin: Skin color, texture, turgor normal. No rashes or lesions Neurologic: Grossly normal : Pleasant  EKG: Deferred  ASSESSMENT: 1. Dyspnea on exertion, fatigue  and exercise intolerance 2. CAD with moderate two-vessel coronary disease (2015) 3. Recent stage III adenocarcinoma status post radiation therapy with pneumonitis 4. Chronic cough 5. Abnormal Myoview stress test with TID ratio of 1.44  PLAN: 1.   Nicole [name] has had progressive dyspnea on exertion, fatigue and exercise intolerance.  Her symptoms may have improved somewhat with steroid therapy however she still reports shortness of breath at rest which is worse with exertion.  She cannot do normal house chores or activities and her symptoms seem to been worse in the past several months.  Her stress test suggested no ischemia however there is an isolated TID ratio of 1.44.  She has prior two-vessel coronary disease and my question would be whether this could represent multivessel disease.  This certainly could explain her symptoms.  Accordingly, it may also be an isolated finding.  I suspect her shortness of breath may be more related to her pulmonary disease.  Nonetheless, it would be reasonable to repeat her cardiac catheterization to assure no significant progression of her coronary artery disease.  I again discussed the risk, benefits and alternatives with her regarding cardiac catheterization and she is agreeable to proceed.  I saw her in the office today for Dr. Irish Lack - who is her cardiologist. Will arrange for catheterization with him and follow-up with him afterwards.  Pixie Casino, MD, [facility], [place] Director of the Advanced Lipid Disorders &  Cardiovascular Risk Reduction Clinic Diplomate of the American Board of Clinical Lipidology Attending Cardiologist  Direct Dial: [phone]  Fax: [phone]  Website:  [email or web address] 02/21/2017, 1:05 PM

## 2017-02-22 ENCOUNTER — Telehealth: Payer: Self-pay | Admitting: Interventional Cardiology

## 2017-02-22 LAB — CBC
Hematocrit: 42.3 % (ref 34.0–46.6)
Hemoglobin: 13.5 g/dL (ref 11.1–15.9)
MCH: 27.8 pg (ref 26.6–33.0)
MCHC: 31.9 g/dL (ref 31.5–35.7)
MCV: 87 fL (ref 79–97)
Platelets: 186 10*3/uL (ref 150–379)
RBC: 4.85 x10E6/uL (ref 3.77–5.28)
RDW: 15.8 % — ABNORMAL HIGH (ref 12.3–15.4)
WBC: 9.3 10*3/uL (ref 3.4–10.8)

## 2017-02-22 LAB — BASIC METABOLIC PANEL
BUN/Creatinine Ratio: 32 — ABNORMAL HIGH (ref 12–28)
BUN: 31 mg/dL — ABNORMAL HIGH (ref 8–27)
CO2: 22 mmol/L (ref 20–29)
Calcium: 8.9 mg/dL (ref 8.7–10.3)
Chloride: 104 mmol/L (ref 96–106)
Creatinine, Ser: 0.98 mg/dL (ref 0.57–1.00)
GFR calc Af Amer: 66 mL/min/{1.73_m2} (ref >59)
GFR calc non Af Amer: 57 mL/min/{1.73_m2} — ABNORMAL LOW (ref >59)
Glucose: 153 mg/dL — ABNORMAL HIGH (ref 65–99)
Potassium: 3.9 mmol/L (ref 3.5–5.2)
Sodium: 140 mmol/L (ref 134–144)

## 2017-02-22 LAB — PROTIME-INR
INR: 1 (ref 0.8–1.2)
Prothrombin Time: 10.2 s (ref 9.1–12.0)

## 2017-02-22 NOTE — Telephone Encounter (Signed)
Follow UP;    Pt was retuning a call,she did not know who called her. She is having a Cath,she thought it might be something about that.

## 2017-02-22 NOTE — Telephone Encounter (Signed)
Patient contacted pre-catheterization at [facility] scheduled for: 02/24/17 10:30 AM Verified arrival time and place: [facility] A/NT 8 AM Confirmed No known allergies  Confirmed Hold medications: No  Eliquis 02/22/17,02/23/17, 02/24/17 No Empagliflozin/metformin 02/23/17, 02/24/17 and for 48 hours after. No Insulin am of  1/2 Insulin night before  Except hold medications confirmed all other AM meds to be taken pre-cath with sip of water including: ASA 81 mg  Confirmed patient has responsible person to drive home post procedure and observe patient for 24 hours:yes

## 2017-02-23 ENCOUNTER — Telehealth: Payer: Self-pay | Admitting: Internal Medicine

## 2017-02-23 ENCOUNTER — Other Ambulatory Visit: Payer: Medicare Other

## 2017-02-23 ENCOUNTER — Ambulatory Visit: Payer: Medicare Other

## 2017-02-23 ENCOUNTER — Ambulatory Visit: Payer: Medicare Other | Admitting: Internal Medicine

## 2017-02-23 NOTE — Telephone Encounter (Signed)
New Message   Pt c/o BP issue:  1. What are your last 5 BP readings? 84/53, 115/69 2. Are you having any other symptoms (ex. Dizziness, headache, blurred vision, passed out)? No symptoms 3. What is your medication issue? None  Patient states that this morning her bp has been low. She needs to know how to proceed with her BP. Please call to discuss.

## 2017-02-23 NOTE — Telephone Encounter (Signed)
Spoke with pt, this morning at 7 am her blood sugar was 61, she had a cold sweat. She quickly ate and the blood sugar now is 150. After eating she checked her bp and it was 84/53. She rested and now her bp is up to 115/69 with pulse of 98. Patient advised to take the diltiazem now to help with her heart rate. Okay given for patient to wait to take the irbesartan later today or tonight before bed. Patient voiced understanding and agreed with this plan. She will call back with problems.

## 2017-02-24 ENCOUNTER — Encounter (HOSPITAL_COMMUNITY): Payer: Self-pay | Admitting: *Deleted

## 2017-02-24 ENCOUNTER — Ambulatory Visit (HOSPITAL_COMMUNITY)
Admission: RE | Admit: 2017-02-24 | Discharge: 2017-02-24 | Disposition: A | Discharge status: Home / Self Care | Payer: Medicare Other | Source: Ambulatory Visit | Attending: Interventional Cardiology | Admitting: Interventional Cardiology

## 2017-02-24 ENCOUNTER — Encounter (HOSPITAL_COMMUNITY)
Admission: RE | Disposition: A | Discharge status: Home / Self Care | Payer: Self-pay | Source: Ambulatory Visit | Attending: Interventional Cardiology

## 2017-02-24 DIAGNOSIS — I10 Essential (primary) hypertension: Secondary | ICD-10-CM | POA: Insufficient documentation

## 2017-02-24 DIAGNOSIS — Z7901 Long term (current) use of anticoagulants: Secondary | ICD-10-CM | POA: Diagnosis not present

## 2017-02-24 DIAGNOSIS — Z96652 Presence of left artificial knee joint: Secondary | ICD-10-CM | POA: Diagnosis not present

## 2017-02-24 DIAGNOSIS — R0602 Shortness of breath: Secondary | ICD-10-CM

## 2017-02-24 DIAGNOSIS — R9439 Abnormal result of other cardiovascular function study: Secondary | ICD-10-CM | POA: Diagnosis not present

## 2017-02-24 DIAGNOSIS — Z794 Long term (current) use of insulin: Secondary | ICD-10-CM | POA: Diagnosis not present

## 2017-02-24 DIAGNOSIS — M1990 Unspecified osteoarthritis, unspecified site: Secondary | ICD-10-CM | POA: Diagnosis not present

## 2017-02-24 DIAGNOSIS — I2510 Atherosclerotic heart disease of native coronary artery without angina pectoris: Secondary | ICD-10-CM | POA: Insufficient documentation

## 2017-02-24 DIAGNOSIS — Z923 Personal history of irradiation: Secondary | ICD-10-CM | POA: Insufficient documentation

## 2017-02-24 DIAGNOSIS — R05 Cough: Secondary | ICD-10-CM | POA: Insufficient documentation

## 2017-02-24 DIAGNOSIS — Z79899 Other long term (current) drug therapy: Secondary | ICD-10-CM | POA: Diagnosis not present

## 2017-02-24 DIAGNOSIS — E119 Type 2 diabetes mellitus without complications: Secondary | ICD-10-CM | POA: Insufficient documentation

## 2017-02-24 DIAGNOSIS — E118 Type 2 diabetes mellitus with unspecified complications: Secondary | ICD-10-CM

## 2017-02-24 DIAGNOSIS — Z85118 Personal history of other malignant neoplasm of bronchus and lung: Secondary | ICD-10-CM | POA: Insufficient documentation

## 2017-02-24 HISTORY — PX: LEFT HEART CATH AND CORONARY ANGIOGRAPHY: CATH118249

## 2017-02-24 LAB — GLUCOSE, CAPILLARY
Glucose-Capillary: 88 mg/dL (ref 65–99)
Glucose-Capillary: 265 mg/dL — ABNORMAL HIGH (ref 65–99)

## 2017-02-24 SURGERY — LEFT HEART CATH AND CORONARY ANGIOGRAPHY
Anesthesia: LOCAL

## 2017-02-24 MED ORDER — EMPAGLIFLOZIN-METFORMIN HCL ER 12.5-1000 MG PO TB24: 1.0000 | ORAL_TABLET | Freq: Every morning | ORAL | 1 refills | Status: DC

## 2017-02-24 MED ORDER — VERAPAMIL HCL 2.5 MG/ML IV SOLN
INTRAVENOUS | Status: DC | PRN
  Administered 2017-02-24: 10 mL via INTRA_ARTERIAL

## 2017-02-24 MED ORDER — FENTANYL CITRATE (PF) 100 MCG/2ML IJ SOLN
INTRAMUSCULAR | Status: DC | PRN
  Administered 2017-02-24: 25 ug via INTRAVENOUS

## 2017-02-24 MED ORDER — IOPAMIDOL (ISOVUE-370) INJECTION 76%
INTRAVENOUS | Status: AC
  Filled 2017-02-24: qty 100

## 2017-02-24 MED ORDER — SODIUM CHLORIDE 0.9 % IV SOLN: 250.0000 mL | INTRAVENOUS | Status: DC | PRN

## 2017-02-24 MED ORDER — SODIUM CHLORIDE 0.9% FLUSH: 3.0000 mL | Freq: Two times a day (BID) | INTRAVENOUS | Status: DC

## 2017-02-24 MED ORDER — MIDAZOLAM HCL 2 MG/2ML IJ SOLN
INTRAMUSCULAR | Status: DC | PRN
  Administered 2017-02-24: 2 mg via INTRAVENOUS

## 2017-02-24 MED ORDER — SODIUM CHLORIDE 0.9% FLUSH: 3.0000 mL | INTRAVENOUS | Status: DC | PRN

## 2017-02-24 MED ORDER — MIDAZOLAM HCL 2 MG/2ML IJ SOLN
INTRAMUSCULAR | Status: AC
  Filled 2017-02-24: qty 2

## 2017-02-24 MED ORDER — LIDOCAINE HCL (PF) 1 % IJ SOLN
INTRAMUSCULAR | Status: AC
  Filled 2017-02-24: qty 30

## 2017-02-24 MED ORDER — VERAPAMIL HCL 2.5 MG/ML IV SOLN
INTRAVENOUS | Status: AC
  Filled 2017-02-24: qty 2

## 2017-02-24 MED ORDER — IOPAMIDOL (ISOVUE-370) INJECTION 76%
INTRAVENOUS | Status: DC | PRN
  Administered 2017-02-24: 55 mL via INTRA_ARTERIAL

## 2017-02-24 MED ORDER — HEPARIN (PORCINE) IN NACL 2-0.9 UNIT/ML-% IJ SOLN
INTRAMUSCULAR | Status: AC
  Filled 2017-02-24: qty 1000

## 2017-02-24 MED ORDER — APIXABAN 5 MG PO TABS: 5.0000 mg | ORAL_TABLET | Freq: Two times a day (BID) | ORAL | 10 refills | Status: DC

## 2017-02-24 MED ORDER — HEPARIN SODIUM (PORCINE) 1000 UNIT/ML IJ SOLN
INTRAMUSCULAR | Status: DC | PRN
  Administered 2017-02-24: 3500 [IU] via INTRAVENOUS

## 2017-02-24 MED ORDER — ASPIRIN 81 MG PO CHEW: 81.0000 mg | CHEWABLE_TABLET | ORAL | Status: DC

## 2017-02-24 MED ORDER — LIDOCAINE HCL (PF) 1 % IJ SOLN
INTRAMUSCULAR | Status: DC | PRN
  Administered 2017-02-24: 2 mL

## 2017-02-24 MED ORDER — SODIUM CHLORIDE 0.9 % IV SOLN: INTRAVENOUS | Status: AC

## 2017-02-24 MED ORDER — HEPARIN SODIUM (PORCINE) 1000 UNIT/ML IJ SOLN
INTRAMUSCULAR | Status: AC
  Filled 2017-02-24: qty 1

## 2017-02-24 MED ORDER — FENTANYL CITRATE (PF) 100 MCG/2ML IJ SOLN
INTRAMUSCULAR | Status: AC
  Filled 2017-02-24: qty 2

## 2017-02-24 MED ORDER — SODIUM CHLORIDE 0.9 % IV SOLN
INTRAVENOUS | Status: DC
  Administered 2017-02-24: 250 mL via INTRAVENOUS
  Administered 2017-02-24: 09:00:00 via INTRAVENOUS

## 2017-02-24 MED ORDER — HEPARIN (PORCINE) IN NACL 2-0.9 UNIT/ML-% IJ SOLN
INTRAMUSCULAR | Status: AC | PRN
  Administered 2017-02-24: 1000 mL

## 2017-02-24 SURGICAL SUPPLY — 9 items

## 2017-02-24 NOTE — Interval H&P Note (Signed)
Cath Lab Visit (complete for each Cath Lab visit)  Clinical Evaluation Leading to the Procedure:   ACS: No.  Non-ACS:    Anginal Classification: CCS III  Anti-ischemic medical therapy: Minimal Therapy (1 class of medications)  Non-Invasive Test Results: High-risk stress test findings: cardiac mortality >3%/year , increased TID  Prior CABG: No previous CABG      History and Physical Interval Note:  02/24/2017 11:32 AM  Nicole [name]  has presented today for surgery, with the diagnosis of shortness of breath - abnormal stress test  The various methods of treatment have been discussed with the patient and family. After consideration of risks, benefits and other options for treatment, the patient has consented to  Procedure(s): LEFT HEART CATH AND CORONARY ANGIOGRAPHY (N/A) as a surgical intervention .  The patient's history has been reviewed, patient examined, no change in status, stable for surgery.  I have reviewed the patient's chart and labs.  Questions were answered to the patient's satisfaction.     Larae Grooms

## 2017-02-24 NOTE — Discharge Instructions (Signed)

## 2017-02-24 NOTE — Progress Notes (Signed)
Patient suddenly developed a dime size hematoma with minimal bleeding to the guaze at the radial site.  Pressure was held for 20 minutes. Bandage changed and arm board applied per recommendation of PA cardilogy. Patient was discharged per PA to home in stable condition with family at 28.

## 2017-02-27 ENCOUNTER — Encounter (HOSPITAL_COMMUNITY): Payer: Self-pay | Admitting: Interventional Cardiology

## 2017-03-01 ENCOUNTER — Telehealth: Payer: Self-pay | Admitting: Medical Oncology

## 2017-03-01 ENCOUNTER — Encounter: Payer: Self-pay | Admitting: Medical Oncology

## 2017-03-01 NOTE — Telephone Encounter (Signed)
asking to call her dentist , Dr Victorino December okay dental procedure.

## 2017-03-01 NOTE — Progress Notes (Signed)
Letter faxed to Dr Gerlene Burdock , DDS re dental procedure.

## 2017-03-02 ENCOUNTER — Telehealth: Payer: Self-pay

## 2017-03-02 NOTE — Telephone Encounter (Signed)
Copied from [facility]. Topic: Inquiry >> Mar 02, 2017 12:59 PM Pricilla Handler wrote: Reason for CRM: Patient called wanting to know if Dr. Ronnald Ramp agrees that patient can now have her dental crown completed. Dr. Earlie Server at the cancer center states that he is ok with the patient having her crown completed if Dr. Ronnald Ramp is ok with it. Patient wants Dr. Ronnald Ramp to call her once he has a chance. Patient also would like Dr. Ronnald Ramp to contact her Dentist - Dr. Teddy Spike [phone]. Patient also says she has back pain and a chronic cough.       Thank You!!!

## 2017-03-02 NOTE — Telephone Encounter (Signed)
Pt would like clearance for her to have dental surgery. Please advise.

## 2017-03-06 ENCOUNTER — Encounter: Payer: Self-pay | Admitting: Internal Medicine

## 2017-03-06 ENCOUNTER — Ambulatory Visit: Payer: Medicare Other | Admitting: Internal Medicine

## 2017-03-06 VITALS — BP 118/68 | HR 102 | Temp 97.8°F | Resp 16 | Ht 68.5 in | Wt 169.2 lb

## 2017-03-06 DIAGNOSIS — R05 Cough: Secondary | ICD-10-CM

## 2017-03-06 DIAGNOSIS — I10 Essential (primary) hypertension: Secondary | ICD-10-CM

## 2017-03-06 DIAGNOSIS — Z794 Long term (current) use of insulin: Secondary | ICD-10-CM | POA: Diagnosis not present

## 2017-03-06 DIAGNOSIS — R053 Chronic cough: Secondary | ICD-10-CM

## 2017-03-06 DIAGNOSIS — E118 Type 2 diabetes mellitus with unspecified complications: Secondary | ICD-10-CM | POA: Diagnosis not present

## 2017-03-06 MED ORDER — FREESTYLE LIBRE 14 DAY SENSOR MISC: 1.0000 | Freq: Every day | 11 refills | Status: DC

## 2017-03-06 MED ORDER — FREESTYLE LIBRE 14 DAY READER DEVI: 1.0000 | Freq: Every day | 11 refills | Status: DC

## 2017-03-06 NOTE — Patient Instructions (Signed)

## 2017-03-07 NOTE — Progress Notes (Signed)
Subjective:  Patient ID: Nicole [name], female    DOB: October 08, 1940  Age: 77 y.o. MRN: [id number]  CC: Hypertension; Diabetes; and Cough   HPI Nicole [name] presents for f/up - She is tired of sticking her fingers so frequently to check her blood sugar.  She is happy to report that her blood sugar has come down some and she denies any recent episodes of polys.  She also reports that her blood pressure has been normal and her cough is diminishing.  She continues to have mild shortness of breath but she denies DOE, CP, diaphoresis, or lightheadedness.  Outpatient Medications Prior to Visit  Medication Sig Dispense Refill  . apixaban (ELIQUIS) 5 MG TABS tablet Take 1 tablet (5 mg total) by mouth 2 (two) times daily. 60 tablet 10  . benzonatate (TESSALON) 100 MG capsule Take 2 capsules (200 mg total) by mouth 3 (three) times daily as needed for cough. 180 capsule 3  . COLLAGEN PO Take 1 tablet by mouth daily.     [name] diltiazem (CARDIZEM CD) 120 MG 24 hr capsule Take 1 capsule (120 mg total) by mouth daily. 30 capsule 11  . Empagliflozin-Metformin HCl ER (SYNJARDY XR) 12.05-998 MG TB24 Take 1 tablet by mouth every morning. 90 tablet 1  . HYDROcodone-homatropine (HYCODAN) 5-1.5 MG/5ML syrup Take 5 mLs by mouth every 6 (six) hours as needed for cough. 120 mL 0  . Insulin Glargine (BASAGLAR KWIKPEN) 100 UNIT/ML SOPN Inject 0.3 mLs (30 Units total) into the skin at bedtime. 9 mL 0  . irbesartan (AVAPRO) 300 MG tablet Take 1 tablet (300 mg total) by mouth daily. 90 tablet 0  . Lactobacillus (PROBIOTIC ACIDOPHILUS PO) Take 1 tablet by mouth daily.     . Multiple Vitamins-Minerals (MULTIVITAMIN WITH MINERALS) tablet Take 1 tablet by mouth daily.    . predniSONE (DELTASONE) 10 MG tablet Take 7 tabs (70 mg) daily X 1 week, then 6 tabs (60 mg) daily X 1 week, then 5 tabs (50 mg daily) X 1 week, then 4 tabs (40 mg) daily X 1 week, then 3 tabs (30 mg) daily X 1 week, then 2 tabs (20 mg) daily X 1 week, then 1  tab (10 mg) daily X 1 week, then stop. 200 tablet 0  . Blood Glucose Monitoring Suppl (ACCU-CHEK GUIDE) w/Device KIT 1 Act by Does not apply route 3 (three) times daily. 2 kit 0  . glucose blood (ACCU-CHEK GUIDE) test strip Use TID to check blood sugar. DX: E11.9 100 each 12  . Lancets (ACCU-CHEK MULTICLIX) lancets Use TID 100 each 12   No facility-administered medications prior to visit.     ROS Review of Systems  Constitutional: Negative for chills, diaphoresis, fatigue, fever and unexpected weight change.  HENT: Negative.  Negative for sore throat.   Eyes: Negative.   Respiratory: Positive for cough and shortness of breath. Negative for chest tightness and wheezing.   Cardiovascular: Negative for chest pain, palpitations and leg swelling.  Gastrointestinal: Negative for abdominal pain, constipation, diarrhea, nausea and vomiting.  Endocrine: Negative for polydipsia, polyphagia and polyuria.  Genitourinary: Negative.  Negative for difficulty urinating.  Musculoskeletal: Negative.  Negative for arthralgias and myalgias.  Skin: Negative.  Negative for color change, pallor and rash.  Allergic/Immunologic: Negative.   Neurological: Negative.  Negative for dizziness, syncope, weakness and light-headedness.  Hematological: Negative for adenopathy. Does not bruise/bleed easily.  Psychiatric/Behavioral: Negative.     Objective:  BP 118/68 (BP Location: Left Arm, Patient  Position: Sitting, Cuff Size: Normal)   Pulse (!) 102   Temp 97.8 F (36.6 C) (Oral)   Resp 16   Ht 5' 8.5" (1.74 m)   Wt 169 lb 4 oz (76.8 kg)   SpO2 97%   BMI 25.36 kg/m   BP Readings from Last 3 Encounters:  03/06/17 118/68  02/24/17 (!) 106/95  02/21/17 138/80    Wt Readings from Last 3 Encounters:  03/06/17 169 lb 4 oz (76.8 kg)  02/24/17 165 lb (74.8 kg)  02/21/17 167 lb (75.8 kg)    Physical Exam  Constitutional: She is oriented to person, place, and time. No distress.  HENT:  Mouth/Throat:  Oropharynx is clear and moist. No oropharyngeal exudate.  Eyes: Conjunctivae are normal. Left eye exhibits no discharge. No scleral icterus.  Neck: Normal range of motion. Neck supple. No JVD present. No thyromegaly present.  Cardiovascular: Normal rate, regular rhythm and normal heart sounds. Exam reveals no gallop and no friction rub.  No murmur heard. Pulmonary/Chest: Effort normal and breath sounds normal. No respiratory distress. She has no wheezes. She has no rales.  Abdominal: Soft. Bowel sounds are normal. She exhibits no distension and no mass. There is no tenderness.  Musculoskeletal: Normal range of motion. She exhibits no edema, tenderness or deformity.  Lymphadenopathy:    She has no cervical adenopathy.  Neurological: She is alert and oriented to person, place, and time.  Skin: Skin is warm and dry. No rash noted. She is not diaphoretic. No erythema. No pallor.  Vitals reviewed.   Lab Results  Component Value Date   WBC 9.3 02/21/2017   HGB 13.5 02/21/2017   HCT 42.3 02/21/2017   PLT 186 02/21/2017   GLUCOSE 153 (H) 02/21/2017   CHOL 203 (H) 02/02/2017   TRIG 147.0 02/02/2017   HDL 61.10 02/02/2017   LDLDIRECT 74.0 06/14/2016   LDLCALC 113 (H) 02/02/2017   ALT 18 02/16/2017   AST 9 02/16/2017   NA 140 02/21/2017   K 3.9 02/21/2017   CL 104 02/21/2017   CREATININE 0.98 02/21/2017   BUN 31 (H) 02/21/2017   CO2 22 02/21/2017   TSH 0.16 (L) 02/02/2017   INR 1.0 02/21/2017   HGBA1C 8.1 02/02/2017   MICROALBUR 4.0 (H) 06/14/2016    No results found.  Assessment & Plan:   Nicole [name] was seen today for hypertension, diabetes and cough.  Diagnoses and all orders for this visit:  Type 2 diabetes mellitus with complication, with long-term current use of insulin ([place])- I have asked her to try a continuous blood glucose monitoring system. -     Continuous Blood Gluc Receiver (FREESTYLE LIBRE 14 DAY READER) DEVI; 1 Act by Does not apply route daily. -     Continuous  Blood Gluc Sensor (FREESTYLE LIBRE 14 DAY SENSOR) MISC; 1 Act by Does not apply route daily.  Essential hypertension- Her blood pressure is well controlled.  Chronic cough- This is related to the recent diagnosis of lung cancer and radiation pneumonitis.  She is getting adequate symptom relief with the current combination of cough suppressants.   I have discontinued [place], accu-chek multiclix, and glucose blood. I am also having her start on FREESTYLE LIBRE 14 DAY READER and FREESTYLE LIBRE 14 DAY SENSOR. Additionally, I am having her maintain her multivitamin with minerals, diltiazem, Lactobacillus (PROBIOTIC ACIDOPHILUS PO), COLLAGEN PO, predniSONE, irbesartan, HYDROcodone-homatropine, benzonatate, BASAGLAR KWIKPEN, apixaban, and Empagliflozin-Metformin HCl ER.  Meds ordered this encounter  Medications  .  Continuous Blood Gluc Receiver (FREESTYLE LIBRE 14 DAY READER) DEVI    Sig: 1 Act by Does not apply route daily.    Dispense:  1 Device    Refill:  11  . Continuous Blood Gluc Sensor (FREESTYLE LIBRE 14 DAY SENSOR) MISC    Sig: 1 Act by Does not apply route daily.    Dispense:  1 each    Refill:  11     Follow-up: Return in about 4 months (around 07/04/2017).  Scarlette Calico, MD

## 2017-03-10 ENCOUNTER — Ambulatory Visit (HOSPITAL_COMMUNITY)
Admission: RE | Admit: 2017-03-10 | Discharge: 2017-03-10 | Disposition: A | Discharge status: Home / Self Care | Payer: Medicare Other | Source: Ambulatory Visit | Attending: Oncology | Admitting: Oncology

## 2017-03-10 ENCOUNTER — Inpatient Hospital Stay: Payer: Medicare Other

## 2017-03-10 ENCOUNTER — Inpatient Hospital Stay: Payer: Medicare Other | Attending: Oncology | Admitting: Medical

## 2017-03-10 ENCOUNTER — Telehealth: Payer: Self-pay | Admitting: *Deleted

## 2017-03-10 ENCOUNTER — Other Ambulatory Visit: Payer: Self-pay | Admitting: Oncology

## 2017-03-10 VITALS — BP 135/73 | HR 92 | Temp 98.8°F | Resp 20 | Ht 68.5 in

## 2017-03-10 DIAGNOSIS — I700 Atherosclerosis of aorta: Secondary | ICD-10-CM

## 2017-03-10 DIAGNOSIS — I2510 Atherosclerotic heart disease of native coronary artery without angina pectoris: Secondary | ICD-10-CM | POA: Diagnosis not present

## 2017-03-10 DIAGNOSIS — I4891 Unspecified atrial fibrillation: Secondary | ICD-10-CM | POA: Insufficient documentation

## 2017-03-10 DIAGNOSIS — R05 Cough: Secondary | ICD-10-CM | POA: Insufficient documentation

## 2017-03-10 DIAGNOSIS — M1990 Unspecified osteoarthritis, unspecified site: Secondary | ICD-10-CM | POA: Insufficient documentation

## 2017-03-10 DIAGNOSIS — Z9221 Personal history of antineoplastic chemotherapy: Secondary | ICD-10-CM | POA: Insufficient documentation

## 2017-03-10 DIAGNOSIS — E119 Type 2 diabetes mellitus without complications: Secondary | ICD-10-CM

## 2017-03-10 DIAGNOSIS — C3490 Malignant neoplasm of unspecified part of unspecified bronchus or lung: Secondary | ICD-10-CM | POA: Diagnosis not present

## 2017-03-10 DIAGNOSIS — Z7952 Long term (current) use of systemic steroids: Secondary | ICD-10-CM | POA: Insufficient documentation

## 2017-03-10 DIAGNOSIS — Z9225 Personal history of immunosupression therapy: Secondary | ICD-10-CM | POA: Insufficient documentation

## 2017-03-10 DIAGNOSIS — R5383 Other fatigue: Secondary | ICD-10-CM | POA: Insufficient documentation

## 2017-03-10 DIAGNOSIS — R0609 Other forms of dyspnea: Secondary | ICD-10-CM | POA: Insufficient documentation

## 2017-03-10 DIAGNOSIS — Z8701 Personal history of pneumonia (recurrent): Secondary | ICD-10-CM | POA: Diagnosis not present

## 2017-03-10 DIAGNOSIS — I10 Essential (primary) hypertension: Secondary | ICD-10-CM | POA: Diagnosis not present

## 2017-03-10 DIAGNOSIS — C3491 Malignant neoplasm of unspecified part of right bronchus or lung: Secondary | ICD-10-CM | POA: Insufficient documentation

## 2017-03-10 DIAGNOSIS — C3431 Malignant neoplasm of lower lobe, right bronchus or lung: Secondary | ICD-10-CM | POA: Diagnosis present

## 2017-03-10 DIAGNOSIS — Z79899 Other long term (current) drug therapy: Secondary | ICD-10-CM | POA: Diagnosis not present

## 2017-03-10 DIAGNOSIS — R059 Cough, unspecified: Secondary | ICD-10-CM

## 2017-03-10 DIAGNOSIS — R0602 Shortness of breath: Secondary | ICD-10-CM

## 2017-03-10 LAB — CBC WITH DIFFERENTIAL (CANCER CENTER ONLY)
Basophils Absolute: 0 10*3/uL (ref 0.0–0.1)
Basophils Relative: 0 %
Eosinophils Absolute: 0 10*3/uL (ref 0.0–0.5)
Eosinophils Relative: 0 %
HCT: 37.6 % (ref 34.8–46.6)
Hemoglobin: 12.3 g/dL (ref 11.6–15.9)
Lymphocytes Relative: 5 %
Lymphs Abs: 0.3 10*3/uL — ABNORMAL LOW (ref 0.9–3.3)
MCH: 28.2 pg (ref 25.1–34.0)
MCHC: 32.8 g/dL (ref 31.5–36.0)
MCV: 86 fL (ref 79.5–101.0)
Monocytes Absolute: 0.3 10*3/uL (ref 0.1–0.9)
Monocytes Relative: 4 %
Neutro Abs: 6.4 10*3/uL (ref 1.5–6.5)
Neutrophils Relative %: 91 %
Platelet Count: 263 10*3/uL (ref 145–400)
RBC: 4.37 MIL/uL (ref 3.70–5.45)
RDW: 18.1 % — ABNORMAL HIGH (ref 11.2–14.5)
WBC Count: 7 10*3/uL (ref 3.9–10.3)

## 2017-03-10 LAB — CMP (CANCER CENTER ONLY)
ALT: 12 U/L (ref 0–55)
AST: 10 U/L (ref 5–34)
Albumin: 2.9 g/dL — ABNORMAL LOW (ref 3.5–5.0)
Alkaline Phosphatase: 77 U/L (ref 40–150)
Anion gap: 7 (ref 3–11)
BUN: 26 mg/dL (ref 7–26)
CO2: 27 mmol/L (ref 22–29)
Calcium: 9.3 mg/dL (ref 8.4–10.4)
Chloride: 105 mmol/L (ref 98–109)
Creatinine: 1 mg/dL (ref 0.60–1.10)
GFR, Est AFR Am: >60 mL/min (ref >60)
GFR, Estimated: 54 mL/min — ABNORMAL LOW (ref >60)
Glucose, Bld: 264 mg/dL — ABNORMAL HIGH (ref 70–140)
Potassium: 4.4 mmol/L (ref 3.5–5.1)
Sodium: 139 mmol/L (ref 136–145)
Total Bilirubin: 0.5 mg/dL (ref 0.2–1.2)
Total Protein: 6.4 g/dL (ref 6.4–8.3)

## 2017-03-10 MED ORDER — ALBUTEROL SULFATE HFA 108 (90 BASE) MCG/ACT IN AERS: 2.0000 | INHALATION_SPRAY | RESPIRATORY_TRACT | 12 refills | Status: DC | PRN

## 2017-03-10 MED ORDER — FLUTICASONE PROPIONATE HFA 110 MCG/ACT IN AERO: 2.0000 | INHALATION_SPRAY | Freq: Two times a day (BID) | RESPIRATORY_TRACT | 12 refills | Status: DC

## 2017-03-10 NOTE — Progress Notes (Signed)
[facility] Progress Note   ETERNITY DEXTER [id number] Dec 13, 1940 77 y.o.  [name] is managed by Dr. Eilleen Kempf  Actively treated with chemotherapy: no   Assessment: Plan:    Cough - Plan: fluticasone (FLOVENT HFA) 110 MCG/ACT inhaler, albuterol (PROVENTIL HFA;VENTOLIN HFA) 108 (90 Base) MCG/ACT inhaler  Shortness of breath - Plan: fluticasone (FLOVENT HFA) 110 MCG/ACT inhaler, albuterol (PROVENTIL HFA;VENTOLIN HFA) 108 (90 Base) MCG/ACT inhaler   Cough with shortness of breath with activity: The patient's chest x-ray returned showing no evidence of pneumonia or pleural effusion.  The chest x-ray showed possible postradiation changes.  A CBC and chemistry panel were ordered.  The CBC showed no evidence of leukocytosis or anemia.  The patient continues to taper off of a prednisone taper that she had previously been given.  She was given a prescription for a Flovent inhaler, 2 puffs twice daily and an albuterol inhaler with instructions to use 1-2 puffs every 4 hours as needed for shortness of breath.  She will return for follow-up with Dr. Julien Nordmann on 03/21/2017.  Consideration would be given to recommend a referral pulmonology through her primary care provider should her shortness of breath and cough continue.  Please see After Visit Summary for patient specific instructions.  Future Appointments  Date Time Provider [place]  03/21/2017  8:15 AM [facility] None  03/21/2017  8:45 AM Curt Bears, MD [facility] None  03/23/2017  3:00 PM Renato Shin, MD [facility] None    No orders of the defined types were placed in this encounter.      Subjective:   Patient ID:  Nicole [name] is a 77 y.o. (DOB 10/07/1940) female.  Chief Complaint:  Chief Complaint  Patient presents with  . Cough    SOB    HPI Nicole [name]  is a 77 year old female with a diagnosis of a stage IIIa (T1b, and 2, M0) non-small cell lung cancer,  adenocarcinoma which was originally diagnosed in June 2018 when she presented with a right lower lobe lung mass with right hilar and subcarinal lymphadenopathy.  She was last seen by Dr. Julien Nordmann on 02/16/2017 and was tapering off of prednisone.  She has a history of type 2 diabetes.  She was seen in the emergency room on 02/17/2017 for hyperglycemia.  Her blood glucose at that time had  been around 500.  Her most recent restaging CT scans from 01 02/2017 showed evolving postradiation changes in the right lung which were obscuring the previously noted right lower lobe pulmonary nodule.  No new pulmonary nodules or masses were noted.  There was no lymphadenopathy in the mediastinal or hilar nodal stations.  She is currently being managed conservatively and is to see Dr. Earlie Server back for follow-up on 03/21/2017. She had a left heart cardiac catheterization on 02/24/2017.  She was felt to have a false positive myocardial perfusion study.  The chest discomfort that she had been experienced was believed to be noncardiac.  The patient contact her office today stating that she was experiencing shortness of breath at rest and with on walking short distances at home.  She reports that she had had a cough for a while that resolved after she was prescribed steroids.  She feels as though she may have pulled a muscle in her lower back with pain radiating up her back.  At times she is unable to ambulate due to her pain.  She is using an over-the-counter analgesic patch  once a day.  She had labs and a chest x-ray ordered today.  The patient's labs today did not show evidence of infection or anemia.  Her chest x-ray showed a right perihilar opacity corresponding to that which was seen on her most recent chest CT scan with a differential of possible radiation changes.  Medications: I have reviewed the patient's current medications.  Allergies:  Allergies  Allergen Reactions  . No Known Allergies     Past Medical History:    Diagnosis Date  . A-fib ([place])   . Adenocarcinoma of right lung, stage 3 ([place]) 07/28/2016  . Bronchitis    hx of  . 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 Diabetes mellitus    type 2  . Gallstones   . Headache   . History of radiation therapy 08/09/2016 to 09/19/2016   Right lung was treated to 60 Gy in 30 fractions at 2 Gy per fraction  . Hypertension   . Low back pain   . Lung mass    with lymphadenopathy  . Osteoarthritis     Past Surgical History:  Procedure Laterality Date  . ABDOMINAL HYSTERECTOMY  2004  . APPENDECTOMY  when 77 years old  . COLONOSCOPY  08-05-09   [name]  . INCONTINENCE SURGERY    . KNEE ARTHROSCOPY Left   . LEFT HEART CATH AND CORONARY ANGIOGRAPHY N/A 02/24/2017   Procedure: LEFT HEART CATH AND CORONARY ANGIOGRAPHY;  Surgeon: Jettie Booze, MD;  Location: [place] [facility];  Service: Cardiovascular;  Laterality: N/A;  . LEFT HEART CATHETERIZATION WITH CORONARY ANGIOGRAM N/A 07/19/2013   Procedure: LEFT HEART CATHETERIZATION WITH CORONARY ANGIOGRAM;  Surgeon: Sinclair Grooms, MD;  Location: [facility] CATH [facility];  Service: Cardiovascular;  Laterality: N/A;  . POLYPECTOMY  08-05-09   2 polyps  . TOTAL KNEE ARTHROPLASTY Left 03/28/2012   Procedure: LEFT TOTAL KNEE ARTHROPLASTY;  Surgeon: Tobi Bastos, MD;  Location: [facility];  Service: Orthopedics;  Laterality: Left;  . TUBAL LIGATION    . VIDEO BRONCHOSCOPY WITH ENDOBRONCHIAL ULTRASOUND N/A 07/11/2016   Procedure: VIDEO BRONCHOSCOPY WITH ENDOBRONCHIAL ULTRASOUND;  Surgeon: Marshell Garfinkel, MD;  Location: [place];  Service: Pulmonary;  Laterality: N/A;    Family History  Problem Relation Age of Onset  . Heart attack Father   . Heart disease Father   . Arthritis Other   . Cancer Other        colon, lst degree relative  . Diabetes Other        st degree relative  . Hyperlipidemia Other   . Hypertension Other   . Colon cancer Neg Hx     Social History   Socioeconomic History  . Marital status:  Married    Spouse name: Not on file  . Number of children: Not on file  . Years of education: Not on file  . Highest education level: Not on file  Social Needs  . Financial resource strain: Not on file  . Food insecurity - worry: Not on file  . Food insecurity - inability: Not on file  . Transportation needs - medical: Not on file  . Transportation needs - non-medical: Not on file  Occupational History  . Occupation: retired    Fish farm manager: RETIRED  Tobacco Use  . Smoking status: Never Smoker  . Smokeless tobacco: Never Used  Substance and Sexual Activity  . Alcohol use: No    Alcohol/week: 0.0 oz  . Drug use: No  . Sexual activity:  Not Currently  Other Topics Concern  . Not on file  Social History Narrative   Regular exercise- Yes    Past Medical History, Surgical history, Social history, and Family history were reviewed and updated as appropriate.   Please see review of systems for further details on the patient's review from today.   Review of Systems:  Review of Systems  Constitutional: Negative for chills, diaphoresis and fever.  HENT: Negative for trouble swallowing.   Respiratory: Positive for cough and shortness of breath. Negative for chest tightness and wheezing.   Cardiovascular: Negative for chest pain, palpitations and leg swelling.    Objective:   Physical Exam:  BP 135/73 (BP Location: Left Arm, Patient Position: Sitting)   Pulse 92   Temp 98.8 F (37.1 C) (Oral)   Resp 20   Ht 5' 8.5" (1.74 m)   SpO2 97%   BMI 25.36 kg/m  ECOG: 0  Physical Exam  Constitutional: No distress.  Cardiovascular: Normal rate, regular rhythm and normal heart sounds. Exam reveals no gallop and no friction rub.  No murmur heard. Pulmonary/Chest: Effort normal and breath sounds normal. No respiratory distress. She has no wheezes. She has no rales.  Neurological: She is alert. Coordination normal.  Skin: Skin is warm and dry. No rash noted. She is not diaphoretic. No  erythema.  Psychiatric: She has a normal mood and affect. Her behavior is normal. Judgment and thought content normal.    Lab Review:     Component Value Date/Time   NA 139 03/10/2017 1348   NA 140 02/21/2017 1238   NA 139 01/26/2017 0915   K 4.4 03/10/2017 1348   K 4.1 01/26/2017 0915   CL 105 03/10/2017 1348   CO2 27 03/10/2017 1348   CO2 24 01/26/2017 0915   GLUCOSE 264 (H) 03/10/2017 1348   GLUCOSE 128 01/26/2017 0915   BUN 26 03/10/2017 1348   BUN 31 (H) 02/21/2017 1238   BUN 22.0 01/26/2017 0915   CREATININE 1.00 03/10/2017 1348   CREATININE 0.9 01/26/2017 0915   CALCIUM 9.3 03/10/2017 1348   CALCIUM 9.2 01/26/2017 0915   PROT 6.4 03/10/2017 1348   PROT 7.3 01/26/2017 0915   ALBUMIN 2.9 (L) 03/10/2017 1348   ALBUMIN 2.9 (L) 01/26/2017 0915   AST 10 03/10/2017 1348   AST 13 01/26/2017 0915   ALT 12 03/10/2017 1348   ALT 12 01/26/2017 0915   ALKPHOS 77 03/10/2017 1348   ALKPHOS 72 01/26/2017 0915   BILITOT 0.5 03/10/2017 1348   BILITOT 0.37 01/26/2017 0915   GFRNONAA 54 (L) 03/10/2017 1348   GFRAA >60 03/10/2017 1348       Component Value Date/Time   WBC 7.0 03/10/2017 1348   WBC 10.9 (H) 02/17/2017 1714   RBC 4.37 03/10/2017 1348   HGB 13.5 02/21/2017 1238   HGB 13.0 01/26/2017 0915   HCT 37.6 03/10/2017 1348   HCT 42.3 02/21/2017 1238   HCT 39.3 01/26/2017 0915   PLT 263 03/10/2017 1348   PLT 186 02/21/2017 1238   MCV 86.0 03/10/2017 1348   MCV 87 02/21/2017 1238   MCV 86.7 01/26/2017 0915   MCH 28.2 03/10/2017 1348   MCHC 32.8 03/10/2017 1348   RDW 18.1 (H) 03/10/2017 1348   RDW 15.8 (H) 02/21/2017 1238   RDW 14.5 01/26/2017 0915   LYMPHSABS 0.3 (L) 03/10/2017 1348   LYMPHSABS 0.5 (L) 01/26/2017 0915   MONOABS 0.3 03/10/2017 1348   MONOABS 0.5 01/26/2017 0915   EOSABS 0.0  03/10/2017 1348   EOSABS 0.2 01/26/2017 0915   BASOSABS 0.0 03/10/2017 1348   BASOSABS 0.0 01/26/2017 0915   -------------------------------  Imaging from last 24 hours  (if applicable):  Radiology interpretation: Dg Chest 2 View  Result Date: 03/10/2017 CLINICAL DATA:  Increased dyspnea, stage III lung cancer EXAM: CHEST  2 VIEW COMPARISON:  02/21/2017 FINDINGS: Right perihilar soft tissue prominence consistent with lung cancer. No other focal parenchymal opacity. No pleural effusion or pneumothorax. Stable heart size. Thoracic aortic atherosclerosis. No acute osseous abnormality. IMPRESSION: No active cardiopulmonary disease. Stable right perihilar soft tissue prominence consistent with lung cancer. Electronically Signed   By: Kathreen Devoid   On: 03/10/2017 14:45   Dg Chest 2 View  Result Date: 02/17/2017 CLINICAL DATA:  Lung cancer and hyperglycemia EXAM: CHEST  2 VIEW COMPARISON:  Chest CT 01/25/2017 FINDINGS: Right parahilar opacities correspond to the abnormality present on the recent chest CT, possibly due to prior radiation therapy. Cardiomediastinal contours are normal. Mild calcific aortic atherosclerosis. No focal consolidation or pulmonary edema aside from the previously described. No pneumothorax or pleural effusion. IMPRESSION: Right parahilar opacities corresponding to abnormality seen on recent chest CT, possibly radiation change. Electronically Signed   By: Ulyses Jarred M.D.   On: 02/17/2017 20:32

## 2017-03-10 NOTE — Telephone Encounter (Signed)
Patient called stating she is experiencing SOB at rest, and especially with just short distances in her home. Which is affecting her activity level.  Also she stated had a cough for a while that is resolving she was prescribed a steroid. However, she feels like she has pulled a muscle in her lower back with pain that radiates up her back.  At times she is unable to ambulate. She uses a natural analgesic patch once a day. Next appointment 2/26. Raeanne Barry, NP aware - patient scheduled to be seen in symptoms management today. Chest x-ray and labs before appointment. Patient verbalized understanding.

## 2017-03-21 ENCOUNTER — Inpatient Hospital Stay: Payer: Medicare Other

## 2017-03-21 ENCOUNTER — Telehealth: Payer: Self-pay

## 2017-03-21 ENCOUNTER — Inpatient Hospital Stay: Payer: Medicare Other | Admitting: Internal Medicine

## 2017-03-21 ENCOUNTER — Encounter: Payer: Self-pay | Admitting: Internal Medicine

## 2017-03-21 DIAGNOSIS — I700 Atherosclerosis of aorta: Secondary | ICD-10-CM | POA: Diagnosis not present

## 2017-03-21 DIAGNOSIS — C3490 Malignant neoplasm of unspecified part of unspecified bronchus or lung: Secondary | ICD-10-CM

## 2017-03-21 DIAGNOSIS — I4891 Unspecified atrial fibrillation: Secondary | ICD-10-CM

## 2017-03-21 DIAGNOSIS — I10 Essential (primary) hypertension: Secondary | ICD-10-CM

## 2017-03-21 DIAGNOSIS — E119 Type 2 diabetes mellitus without complications: Secondary | ICD-10-CM | POA: Diagnosis not present

## 2017-03-21 DIAGNOSIS — R05 Cough: Secondary | ICD-10-CM

## 2017-03-21 DIAGNOSIS — Z8701 Personal history of pneumonia (recurrent): Secondary | ICD-10-CM

## 2017-03-21 DIAGNOSIS — Z9225 Personal history of immunosupression therapy: Secondary | ICD-10-CM

## 2017-03-21 DIAGNOSIS — M1990 Unspecified osteoarthritis, unspecified site: Secondary | ICD-10-CM | POA: Diagnosis not present

## 2017-03-21 DIAGNOSIS — C3491 Malignant neoplasm of unspecified part of right bronchus or lung: Secondary | ICD-10-CM

## 2017-03-21 DIAGNOSIS — Z9221 Personal history of antineoplastic chemotherapy: Secondary | ICD-10-CM

## 2017-03-21 DIAGNOSIS — I2510 Atherosclerotic heart disease of native coronary artery without angina pectoris: Secondary | ICD-10-CM

## 2017-03-21 DIAGNOSIS — Z79899 Other long term (current) drug therapy: Secondary | ICD-10-CM

## 2017-03-21 DIAGNOSIS — C3431 Malignant neoplasm of lower lobe, right bronchus or lung: Secondary | ICD-10-CM | POA: Diagnosis not present

## 2017-03-21 DIAGNOSIS — R0609 Other forms of dyspnea: Secondary | ICD-10-CM | POA: Diagnosis not present

## 2017-03-21 LAB — CBC WITH DIFFERENTIAL (CANCER CENTER ONLY)
Basophils Absolute: 0 10*3/uL (ref 0.0–0.1)
Basophils Relative: 0 %
Eosinophils Absolute: 0 10*3/uL (ref 0.0–0.5)
Eosinophils Relative: 0 %
HCT: 38.4 % (ref 34.8–46.6)
Hemoglobin: 12.5 g/dL (ref 11.6–15.9)
Lymphocytes Relative: 9 %
Lymphs Abs: 0.6 10*3/uL — ABNORMAL LOW (ref 0.9–3.3)
MCH: 28.7 pg (ref 25.1–34.0)
MCHC: 32.6 g/dL (ref 31.5–36.0)
MCV: 88.3 fL (ref 79.5–101.0)
Monocytes Absolute: 0.3 10*3/uL (ref 0.1–0.9)
Monocytes Relative: 4 %
Neutro Abs: 6.3 10*3/uL (ref 1.5–6.5)
Neutrophils Relative %: 87 %
Platelet Count: 204 10*3/uL (ref 145–400)
RBC: 4.35 MIL/uL (ref 3.70–5.45)
RDW: 18.3 % — ABNORMAL HIGH (ref 11.2–14.5)
WBC Count: 7.3 10*3/uL (ref 3.9–10.3)

## 2017-03-21 LAB — CMP (CANCER CENTER ONLY)
ALT: 14 U/L (ref 0–55)
AST: 13 U/L (ref 5–34)
Albumin: 2.9 g/dL — ABNORMAL LOW (ref 3.5–5.0)
Alkaline Phosphatase: 96 U/L (ref 40–150)
Anion gap: 9 (ref 3–11)
BUN: 26 mg/dL (ref 7–26)
CO2: 24 mmol/L (ref 22–29)
Calcium: 8.9 mg/dL (ref 8.4–10.4)
Chloride: 107 mmol/L (ref 98–109)
Creatinine: 0.91 mg/dL (ref 0.60–1.10)
GFR, Est AFR Am: >60 mL/min (ref >60)
GFR, Estimated: >60 mL/min (ref >60)
Glucose, Bld: 186 mg/dL — ABNORMAL HIGH (ref 70–140)
Potassium: 3.7 mmol/L (ref 3.5–5.1)
Sodium: 140 mmol/L (ref 136–145)
Total Bilirubin: 0.8 mg/dL (ref 0.2–1.2)
Total Protein: 6.7 g/dL (ref 6.4–8.3)

## 2017-03-21 MED ORDER — HYDROCODONE-HOMATROPINE 5-1.5 MG/5ML PO SYRP: 5.0000 mL | ORAL_SOLUTION | Freq: Four times a day (QID) | ORAL | 0 refills | Status: DC | PRN

## 2017-03-21 NOTE — Telephone Encounter (Signed)
Printed avs and calender of upcoming appointments. Per 2/26 los

## 2017-03-21 NOTE — Progress Notes (Signed)
[place] Telephone:([phone]) [phone]   Fax:(336) [phone]  OFFICE PROGRESS NOTE  [name], MD [address] [facility] [address] [place] [address]  DIAGNOSIS: Stage IIIa (T1b, N2, M0) non-small cell lung cancer, adenocarcinoma presented with right lower lobe lung mass in addition to right hilar and subcarinal lymphadenopathy diagnosed in June 2018.  Biomarker Findings Tumor Mutational Burden - TMB-Intermediate (8 Muts/Mb) Microsatellite Status - Cannot Be Determined Genomic Findings For a complete list of the genes assayed, please refer to the Appendix. EGFR E709K, G719S ION6EX B284X LKG4W1 splice site [id number]>D 7 Disease relevant genes with no reportable alterations: KRAS, ALK, BRAF, MET, ERBB2, RET, ROS1  PDL1 Expression 70%.  PRIOR THERAPY: 1) Concurrent chemoradiation with weekly carboplatin for AUC of 2 and paclitaxel 45 MG/M2. Status post 7 cycles with partial response. 2) Consolidation treatment with immunotherapy with Imfinzi (Durvalumab) 10 MG/KG every 2 weeks, first dose 10/19/2016.  Status post 5 cycles.  Discontinued secondary to intolerance and persistent pneumonitis.  CURRENT THERAPY: Observation.  INTERVAL HISTORY: Nicole [name] 77 y.o. female returns to the clinic today for follow-up visit.  The patient is feeling much better today.  She continues to have mild cough.  She has improvement in her breathing and cough since her last visit.  She completed a course of taper prednisone last Friday.  She denied having any fever or chills.  She has no nausea, vomiting, diarrhea or constipation.  She denied having any weight loss or night sweats.  The patient denied having any chest pain or hemoptysis.  She is here today for evaluation and repeat blood work.  MEDICAL HISTORY: Past Medical History:  Diagnosis Date  . A-fib ([place])   . Adenocarcinoma of right lung, stage 3 ([place]) 07/28/2016  . Bronchitis    hx of  . Cancer 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 Diabetes mellitus    type 2  . Gallstones   . Headache   . History of radiation therapy 08/09/2016 to 09/19/2016   Right lung was treated to 60 Gy in 30 fractions at 2 Gy per fraction  . Hypertension   . Low back pain   . Lung mass    with lymphadenopathy  . Osteoarthritis     ALLERGIES:  is allergic to no known allergies.  MEDICATIONS:  Current Outpatient Medications  Medication Sig Dispense Refill  . albuterol (PROVENTIL HFA;VENTOLIN HFA) 108 (90 Base) MCG/ACT inhaler Inhale 2 puffs into the lungs every 4 (four) hours as needed for wheezing or shortness of breath. 1 Inhaler 12  . apixaban (ELIQUIS) 5 MG TABS tablet Take 1 tablet (5 mg total) by mouth 2 (two) times daily. 60 tablet 10  . benzonatate (TESSALON) 100 MG capsule Take 2 capsules (200 mg total) by mouth 3 (three) times daily as needed for cough. 180 capsule 3  . COLLAGEN PO Take 1 tablet by mouth daily.     . Continuous Blood Gluc Receiver (FREESTYLE LIBRE 14 DAY READER) DEVI 1 Act by Does not apply route daily. 1 Device 11  . Continuous Blood Gluc Sensor (FREESTYLE LIBRE 14 DAY SENSOR) MISC 1 Act by Does not apply route daily. 1 each 11  . diltiazem (CARDIZEM CD) 120 MG 24 hr capsule Take 1 capsule (120 mg total) by mouth daily. 30 capsule 11  . Empagliflozin-Metformin HCl ER (SYNJARDY XR) 12.05-998 MG TB24 Take 1 tablet by mouth every morning. 90 tablet 1  . fluticasone (FLOVENT HFA) 110  MCG/ACT inhaler Inhale 2 puffs into the lungs 2 (two) times daily. 1 Inhaler 12  . HYDROcodone-homatropine (HYCODAN) 5-1.5 MG/5ML syrup Take 5 mLs by mouth every 6 (six) hours as needed for cough. 120 mL 0  . Insulin Glargine (BASAGLAR KWIKPEN) 100 UNIT/ML SOPN Inject 0.3 mLs (30 Units total) into the skin at bedtime. 9 mL 0  . irbesartan (AVAPRO) 300 MG tablet Take 1 tablet (300 mg total) by mouth daily. 90 tablet 0  . Lactobacillus (PROBIOTIC ACIDOPHILUS PO) Take 1 tablet by mouth daily.     . Multiple Vitamins-Minerals (MULTIVITAMIN  WITH MINERALS) tablet Take 1 tablet by mouth daily.     No current facility-administered medications for this visit.     SURGICAL HISTORY:  Past Surgical History:  Procedure Laterality Date  . ABDOMINAL HYSTERECTOMY  2004  . APPENDECTOMY  when 77 years old  . COLONOSCOPY  08-05-09   [name]  . INCONTINENCE SURGERY    . KNEE ARTHROSCOPY Left   . LEFT HEART CATH AND CORONARY ANGIOGRAPHY N/A 02/24/2017   Procedure: LEFT HEART CATH AND CORONARY ANGIOGRAPHY;  Surgeon: Jettie Booze, MD;  Location: [place] [facility];  Service: Cardiovascular;  Laterality: N/A;  . LEFT HEART CATHETERIZATION WITH CORONARY ANGIOGRAM N/A 07/19/2013   Procedure: LEFT HEART CATHETERIZATION WITH CORONARY ANGIOGRAM;  Surgeon: Sinclair Grooms, MD;  Location: [facility] CATH LAB;  Service: Cardiovascular;  Laterality: N/A;  . POLYPECTOMY  08-05-09   2 polyps  . TOTAL KNEE ARTHROPLASTY Left 03/28/2012   Procedure: LEFT TOTAL KNEE ARTHROPLASTY;  Surgeon: Tobi Bastos, MD;  [facility];  Service: Orthopedics;  Laterality: Left;  . TUBAL LIGATION    . VIDEO BRONCHOSCOPY WITH ENDOBRONCHIAL ULTRASOUND N/A 07/11/2016   Procedure: VIDEO BRONCHOSCOPY WITH ENDOBRONCHIAL ULTRASOUND;  Surgeon: Marshell Garfinkel, MD;  Location: [place];  Service: Pulmonary;  Laterality: N/A;    REVIEW OF SYSTEMS:  A comprehensive review of systems was negative except for: Respiratory: positive for cough and dyspnea on exertion   PHYSICAL EXAMINATION: General appearance: alert, cooperative and no distress Head: Normocephalic, without obvious abnormality, atraumatic Neck: no adenopathy, no JVD, supple, symmetrical, trachea midline and thyroid not enlarged, symmetric, no tenderness/mass/nodules Lymph nodes: Cervical, supraclavicular, and axillary nodes normal. Resp: clear to auscultation bilaterally Back: symmetric, no curvature. ROM normal. No CVA tenderness. Cardio: regular rate and rhythm, S1, S2 normal, no murmur, click, rub or gallop GI:  soft, non-tender; bowel sounds normal; no masses,  no organomegaly Extremities: extremities normal, atraumatic, no cyanosis or edema  ECOG PERFORMANCE STATUS: 1 - Symptomatic but completely ambulatory  Blood pressure (!) 124/59, pulse (!) 101, temperature 98.4 F (36.9 C), temperature source Oral, resp. rate 17, height 5' 8.5" (1.74 m), weight 172 lb 14.4 oz (78.4 kg), SpO2 99 %.  LABORATORY DATA: Lab Results  Component Value Date   WBC 7.3 03/21/2017   HGB 13.5 02/21/2017   HCT 38.4 03/21/2017   MCV 88.3 03/21/2017   PLT 204 03/21/2017      Chemistry      Component Value Date/Time   NA 140 03/21/2017 0827   NA 140 02/21/2017 1238   NA 139 01/26/2017 0915   K 3.7 03/21/2017 0827   K 4.1 01/26/2017 0915   CL 107 03/21/2017 0827   CO2 24 03/21/2017 0827   CO2 24 01/26/2017 0915   BUN 26 03/21/2017 0827   BUN 31 (H) 02/21/2017 1238   BUN 22.0 01/26/2017 0915   CREATININE 0.91 03/21/2017 0827  CREATININE 0.9 01/26/2017 0915      Component Value Date/Time   CALCIUM 8.9 03/21/2017 0827   CALCIUM 9.2 01/26/2017 0915   ALKPHOS 96 03/21/2017 0827   ALKPHOS 72 01/26/2017 0915   AST 13 03/21/2017 0827   AST 13 01/26/2017 0915   ALT 14 03/21/2017 0827   ALT 12 01/26/2017 0915   BILITOT 0.8 03/21/2017 0827   BILITOT 0.37 01/26/2017 0915       RADIOGRAPHIC STUDIES: Dg Chest 2 View  Result Date: 03/10/2017 CLINICAL DATA:  Increased dyspnea, stage III lung cancer EXAM: CHEST  2 VIEW COMPARISON:  02/21/2017 FINDINGS: Right perihilar soft tissue prominence consistent with lung cancer. No other focal parenchymal opacity. No pleural effusion or pneumothorax. Stable heart size. Thoracic aortic atherosclerosis. No acute osseous abnormality. IMPRESSION: No active cardiopulmonary disease. Stable right perihilar soft tissue prominence consistent with lung cancer. Electronically Signed   By: Kathreen Devoid   On: 03/10/2017 14:45    ASSESSMENT AND PLAN: This is a very pleasant 77 years  old Asian female recently diagnosed with a stage IIIa non-small cell4 lung cancer, adenocarcinoma. She underwent a course of concurrent chemoradiation with weekly carboplatin and paclitaxel status post 7 cycles. The patient tolerated the previous course fairly well except for mild odynophagia and dry cough. The patient was a started on consolidation treatment with immunotherapy with Imfinzi (Durvalumab) status post 5 cycles.  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 She was treated with a taper dose of prednisone which was completed last week. The patient is feeling much better today. I recommended for her to continue in observation. She will have repeat CT scan of the chest in 2 months for reevaluation of her disease. The patient was advised to call immediately if she has any concerning symptoms in the interval. The patient voices understanding of current disease status and treatment options and is in agreement with the current care plan. All questions were answered. The patient knows to call the clinic with any problems, questions or concerns. We can certainly see the patient much sooner if necessary.  Disclaimer: This note was dictated with voice recognition software. Similar sounding words can inadvertently be transcribed and may not be corrected upon review.

## 2017-03-23 ENCOUNTER — Ambulatory Visit: Payer: Medicare Other | Admitting: Endocrinology

## 2017-03-23 ENCOUNTER — Encounter: Payer: Self-pay | Admitting: Endocrinology

## 2017-03-23 DIAGNOSIS — Z794 Long term (current) use of insulin: Secondary | ICD-10-CM | POA: Diagnosis not present

## 2017-03-23 DIAGNOSIS — E118 Type 2 diabetes mellitus with unspecified complications: Secondary | ICD-10-CM

## 2017-03-23 MED ORDER — BASAGLAR KWIKPEN 100 UNIT/ML ~~LOC~~ SOPN: 15.0000 [IU] | PEN_INJECTOR | Freq: Every day | SUBCUTANEOUS | 0 refills | Status: DC

## 2017-03-23 NOTE — Progress Notes (Signed)
Subjective:    Patient ID: Nicole [name], female    DOB: December 03, 1940, 77 y.o.   MRN: [id number]  HPI pt is referred by Dr Ronnald Ramp, for diabetes.  Pt states DM was dx'ed in 2011; she has mild if any neuropathy of the lower extremities; she has associated insuff and CAD; she has been on insulin since jan of 2019, when she was on steroids; pt says her diet and exercise are good; she has never had GDM, pancreatitis, pancreatic surgery, severe hypoglycemia or DKA.  She takes basaglar and 2 oral meds.  Last dose of steroids was 6 weeks ago.  she brings a record of her cbg's which I have reviewed today.  Over the past few weeks, it varies from 67-100's.   Past Medical History:  Diagnosis Date  . A-fib ([employer])   . Adenocarcinoma of right lung, stage 3 ([place]) 07/28/2016  . Bronchitis    hx of  . Cancer [facility]) 2004   uterine/cervical  . Diabetes mellitus    type 2  . Gallstones   . Headache   . History of radiation therapy 08/09/2016 to 09/19/2016   Right lung was treated to 60 Gy in 30 fractions at 2 Gy per fraction  . Hypertension   . Low back pain   . Lung mass    with lymphadenopathy  . Osteoarthritis     Past Surgical History:  Procedure Laterality Date  . ABDOMINAL HYSTERECTOMY  2004  . APPENDECTOMY  when 77 years old  . COLONOSCOPY  08-05-09   [name]  . INCONTINENCE SURGERY    . KNEE ARTHROSCOPY Left   . LEFT HEART CATH AND CORONARY ANGIOGRAPHY N/A 02/24/2017   Procedure: LEFT HEART CATH AND CORONARY ANGIOGRAPHY;  Surgeon: Jettie Booze, MD;  Location: [place] [facility];  Service: Cardiovascular;  Laterality: N/A;  . LEFT HEART CATHETERIZATION WITH CORONARY ANGIOGRAM N/A 07/19/2013   Procedure: LEFT HEART CATHETERIZATION WITH CORONARY ANGIOGRAM;  Surgeon: Sinclair Grooms, MD;  Location: [facility] CATH LAB;  Service: Cardiovascular;  Laterality: N/A;  . POLYPECTOMY  08-05-09   2 polyps  . TOTAL KNEE ARTHROPLASTY Left 03/28/2012   Procedure: LEFT TOTAL KNEE ARTHROPLASTY;  Surgeon: Tobi Bastos, MD;  [facility];  Service: Orthopedics;  Laterality: Left;  . TUBAL LIGATION    . VIDEO BRONCHOSCOPY WITH ENDOBRONCHIAL ULTRASOUND N/A 07/11/2016   Procedure: VIDEO BRONCHOSCOPY WITH ENDOBRONCHIAL ULTRASOUND;  Surgeon: Marshell Garfinkel, MD;  Location: [place];  Service: Pulmonary;  Laterality: N/A;    Social History   Socioeconomic History  . Marital status: Married    Spouse name: Not on file  . Number of children: Not on file  . Years of education: Not on file  . Highest education level: Not on file  Social Needs  . Financial resource strain: Not on file  . Food insecurity - worry: Not on file  . Food insecurity - inability: Not on file  . Transportation needs - medical: Not on file  . Transportation needs - non-medical: Not on file  Occupational History  . Occupation: retired    Fish farm manager: RETIRED  Tobacco Use  . Smoking status: Never Smoker  . Smokeless tobacco: Never Used  Substance and Sexual Activity  . Alcohol use: No    Alcohol/week: 0.0 oz  . Drug use: No  . Sexual activity: Not Currently  Other Topics Concern  . Not on file  Social History Narrative   Regular exercise- Yes    Current  Outpatient Medications on File Prior to Visit  Medication Sig Dispense Refill  . albuterol (PROVENTIL HFA;VENTOLIN HFA) 108 (90 Base) MCG/ACT inhaler Inhale 2 puffs into the lungs every 4 (four) hours as needed for wheezing or shortness of breath. 1 Inhaler 12  . apixaban (ELIQUIS) 5 MG TABS tablet Take 1 tablet (5 mg total) by mouth 2 (two) times daily. 60 tablet 10  . benzonatate (TESSALON) 100 MG capsule Take 2 capsules (200 mg total) by mouth 3 (three) times daily as needed for cough. 180 capsule 3  . COLLAGEN PO Take 1 tablet by mouth daily.     . Continuous Blood Gluc Receiver (FREESTYLE LIBRE 14 DAY READER) DEVI 1 Act by Does not apply route daily. 1 Device 11  . Continuous Blood Gluc Sensor (FREESTYLE LIBRE 14 DAY SENSOR) MISC 1 Act by Does not apply route daily.  1 each 11  . diltiazem (CARDIZEM CD) 120 MG 24 hr capsule Take 1 capsule (120 mg total) by mouth daily. 30 capsule 11  . Empagliflozin-Metformin HCl ER (SYNJARDY XR) 12.05-998 MG TB24 Take 1 tablet by mouth every morning. 90 tablet 1  . fluticasone (FLOVENT HFA) 110 MCG/ACT inhaler Inhale 2 puffs into the lungs 2 (two) times daily. 1 Inhaler 12  . HYDROcodone-homatropine (HYCODAN) 5-1.5 MG/5ML syrup Take 5 mLs by mouth every 6 (six) hours as needed for cough. 120 mL 0  . irbesartan (AVAPRO) 300 MG tablet Take 1 tablet (300 mg total) by mouth daily. 90 tablet 0  . Lactobacillus (PROBIOTIC ACIDOPHILUS PO) Take 1 tablet by mouth daily.     . Multiple Vitamins-Minerals (MULTIVITAMIN WITH MINERALS) tablet Take 1 tablet by mouth daily.     No current facility-administered medications on file prior to visit.     Allergies  Allergen Reactions  . No Known Allergies     Family History  Problem Relation Age of Onset  . Heart attack Father   . Heart disease Father   . Arthritis Other   . Cancer Other        colon, lst degree relative  . Diabetes Other        st degree relative  . Hyperlipidemia Other   . Hypertension Other   . Colon cancer Neg Hx     BP 120/68 (BP Location: Left Arm, Patient Position: Sitting, Cuff Size: Normal)   Pulse (!) 103   Ht 5' 8.5" (1.74 m)   Wt 175 lb 6.4 oz (79.6 kg)   SpO2 97%   BMI 26.28 kg/m     Review of Systems denies blurry vision, headache, chest pain, n/v, muscle cramps, excessive diaphoresis, memory loss, depression, cold intolerance, rhinorrhea, and easy bruising. She has doe and urinary frequency. She has lost 25 lbs recently.          Objective:   Physical Exam VS: see vs page GEN: no distress HEAD: head: no deformity eyes: no periorbital swelling, no proptosis external nose and ears are normal mouth: no lesion seen NECK: supple, thyroid is not enlarged CHEST WALL: no deformity LUNGS: clear to auscultation CV: reg rate and rhythm, no  murmur ABD: abdomen is soft, nontender.  no hepatosplenomegaly.  not distended.  no hernia MUSCULOSKELETAL: muscle bulk and strength are grossly normal.  no obvious joint swelling.  gait is normal and steady EXTEMITIES: no deformity.  no ulcer on the feet.  feet are of normal color and temp.  1+ bilat leg edema PULSES: dorsalis pedis intact bilat.  no carotid bruit NEURO:  cn 2-12 grossly intact.   readily moves all 4's.  sensation is intact to touch on the feet SKIN:  Normal texture and temperature.  No rash or suspicious lesion is visible.   NODES:  None palpable at the neck PSYCH: alert, well-oriented.  Does not appear anxious nor depressed.   Lab Results  Component Value Date   HGBA1C 8.1 02/02/2017   Lab Results  Component Value Date   CREATININE 0.91 03/21/2017   BUN 26 03/21/2017   NA 140 03/21/2017   K 3.7 03/21/2017   CL 107 03/21/2017   CO2 24 03/21/2017   I have reviewed outside records, and summarized: Pt was noted to have elevated a1c, and referred here.  She recently also had heart cath.  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  She wants to see is she can d/c insulin. stage 3 lung cancer: she is not a candidate for aggressive glycemic control  Patient Instructions  good diet and exercise significantly improve the control of your diabetes.  please let me know if you wish to be referred to a dietician.  high blood sugar is very risky to your health.  you should see an eye doctor and dentist every year.  It is very important to get all recommended vaccinations.  Controlling your blood pressure and cholesterol drastically reduces the damage diabetes does to your body.  Those who smoke should quit.  Please discuss these with your doctor.  check your blood sugar once a day.  vary the time of day when you check, between before the 3 meals, and at bedtime.  also check if you have  symptoms of your blood sugar being too high or too low.  please keep a record of the readings and bring it to your next appointment here (or you can bring the meter itself).  You can write it on any piece of paper.  please call us sooner if your blood sugar goes below 70, or if you have a lot of readings over 200. For now, please: Reduce the insulin to 15 units daily, and: Please continue the same "synjardy," and: Please call or message [place] next week, to tell us how the blood sugar is doing.  I hope we can then change the insulin to another pill. Please come back for a follow-up appointment in 2 months.

## 2017-03-23 NOTE — Patient Instructions (Signed)
good diet and exercise significantly improve the control of your diabetes.  please let me know if you wish to be referred to a dietician.  high blood sugar is very risky to your health.  you should see an eye doctor and dentist every year.  It is very important to get all recommended vaccinations.  Controlling your blood pressure and cholesterol drastically reduces the damage diabetes does to your body.  Those who smoke should quit.  Please discuss these with your doctor.  check your blood sugar once a day.  vary the time of day when you check, between before the 3 meals, and at bedtime.  also check if you have symptoms of your blood sugar being too high or too low.  please keep a record of the readings and bring it to your next appointment here (or you can bring the meter itself).  You can write it on any piece of paper.  please call us sooner if your blood sugar goes below 70, or if you have a lot of readings over 200. For now, please: Reduce the insulin to 15 units daily, and: Please continue the same "synjardy," and: Please call or message [place] next week, to tell us how the blood sugar is doing.  I hope we can then change the insulin to another pill. Please come back for a follow-up appointment in 2 months.

## 2017-03-27 DIAGNOSIS — Z01419 Encounter for gynecological examination (general) (routine) without abnormal findings: Secondary | ICD-10-CM | POA: Diagnosis not present

## 2017-03-27 DIAGNOSIS — N959 Unspecified menopausal and perimenopausal disorder: Secondary | ICD-10-CM | POA: Diagnosis not present

## 2017-03-27 DIAGNOSIS — Z6827 Body mass index (BMI) 27.0-27.9, adult: Secondary | ICD-10-CM | POA: Diagnosis not present

## 2017-03-27 DIAGNOSIS — Z1231 Encounter for screening mammogram for malignant neoplasm of breast: Secondary | ICD-10-CM | POA: Diagnosis not present

## 2017-03-29 LAB — HM MAMMOGRAPHY

## 2017-03-31 ENCOUNTER — Telehealth: Payer: Self-pay

## 2017-03-31 NOTE — Telephone Encounter (Signed)
Your sugar is so low, you may not need to add another pill. Please reduce thje insulin to 7 units qd. Please call or message [place] next week, to tell us how the blood sugar is doing Please continue the same "synjardy."

## 2017-03-31 NOTE — Telephone Encounter (Signed)
Patient called give BS readings:  3/1 71 3/2 71 3/3 110 3/4 105 3/5 81 3/6 81 3/7 105 3/8 95

## 2017-03-31 NOTE — Telephone Encounter (Signed)
I called & patient stated that she would call next week with blood sugars.

## 2017-04-04 ENCOUNTER — Encounter: Payer: Self-pay | Admitting: Internal Medicine

## 2017-04-04 NOTE — Progress Notes (Signed)
Result abstracted and sent to scan ° °

## 2017-04-04 NOTE — Progress Notes (Signed)
ERROR

## 2017-04-07 ENCOUNTER — Telehealth: Payer: Self-pay | Admitting: Endocrinology

## 2017-04-07 MED ORDER — SITAGLIPTIN PHOSPHATE 100 MG PO TABS: 100.0000 mg | ORAL_TABLET | Freq: Every day | ORAL | 11 refills | Status: DC

## 2017-04-07 NOTE — Telephone Encounter (Signed)
-----   Message from Dorna Leitz, [place] sent at 04/07/2017  3:57 PM EDT ----- Regarding: BS readings BS readings:   3/9 97 3/10 107 3/11 120 3/12 117 3/13 117 3/14 128  All blood sugars check around 8 am.

## 2017-04-07 NOTE — Telephone Encounter (Signed)
LVM for patient to call back with BS readings.

## 2017-04-07 NOTE — Telephone Encounter (Signed)
Patient needs a call back to give blood sugar readings for Dr.  Please advise

## 2017-04-07 NOTE — Telephone Encounter (Signed)
Please continue the same synjardy Please change the insulin to [place].  I have sent a prescription to your pharmacy Please call or message [place] next week, to tell us how the blood sugar is doing

## 2017-04-10 NOTE — Telephone Encounter (Signed)
I contacted the patient and advised of message via voicemail. Requested a call back if the patient would like to discuss further.  

## 2017-04-11 ENCOUNTER — Telehealth: Payer: Self-pay

## 2017-04-11 ENCOUNTER — Telehealth: Payer: Self-pay | Admitting: Endocrinology

## 2017-04-11 DIAGNOSIS — Z794 Long term (current) use of insulin: Secondary | ICD-10-CM

## 2017-04-11 DIAGNOSIS — E118 Type 2 diabetes mellitus with unspecified complications: Secondary | ICD-10-CM

## 2017-04-11 MED ORDER — BLOOD GLUCOSE MONITOR KIT: PACK | 0 refills | Status: DC

## 2017-04-11 NOTE — Telephone Encounter (Signed)
Patient has been waiting for Nurse to call her since Monday re: last weeks blood sugar numbers. Please call patient at ph# [phone] Wants to know if medication is going to be adjusted yet

## 2017-04-11 NOTE — Telephone Encounter (Signed)
Request to fill Accu Chek glucometer. Rx printed and faxed to pharmacy.

## 2017-04-11 NOTE — Telephone Encounter (Signed)
error 

## 2017-04-11 NOTE — Telephone Encounter (Signed)
LVM for patient to call back for changes based on last weeks blood sugar readings.

## 2017-04-12 ENCOUNTER — Other Ambulatory Visit: Payer: Self-pay | Admitting: Internal Medicine

## 2017-04-12 ENCOUNTER — Telehealth: Payer: Self-pay | Admitting: Endocrinology

## 2017-04-12 ENCOUNTER — Telehealth: Payer: Self-pay | Admitting: Internal Medicine

## 2017-04-12 DIAGNOSIS — Z794 Long term (current) use of insulin: Secondary | ICD-10-CM

## 2017-04-12 DIAGNOSIS — E118 Type 2 diabetes mellitus with unspecified complications: Secondary | ICD-10-CM

## 2017-04-12 MED ORDER — GLUCOSE BLOOD VI STRP: ORAL_STRIP | 12 refills | Status: DC

## 2017-04-12 NOTE — Telephone Encounter (Signed)
Yes, bring her in for an xray of her back and to start insulin

## 2017-04-12 NOTE — Telephone Encounter (Signed)
Tammy called pt and stated: "I left her a vm to call back to get worked in Architectural technologist.  I completed a CRM to let them know to transfer patient to office to get worked in for tomorrow".

## 2017-04-12 NOTE — Telephone Encounter (Signed)
1. Do you want to fit pt in sooner than 04/26/17?  2. Pt is requesting rx for insulin, I did not see any on her med list. It looks Dr. Loanne Drilling d/c'ed on 04/07/2017

## 2017-04-12 NOTE — Telephone Encounter (Signed)
Encounter signed in error. Please advice to message below.

## 2017-04-12 NOTE — Telephone Encounter (Signed)
Copied from [place] [id number]. Topic: Quick Communication - See Telephone Encounter >> Apr 12, 2017 11:34 AM Bea Graff, NT wrote: CRM for notification. See Telephone encounter for: Pt scheduled for an appt on 04/26/17 but she wants to see if Dr. Ronnald Ramp wants her to be seen sooner for back pain she has developed. She is afraid it could be related to her cancer. She doesn't see the cancer doctor until may. She states she had a bad cough and then Dr. Inda Merlin gave her steroids for the cough and from the cough she developed this back pain. She would like a call to discuss this. She also states that the glucose meter and test strips needs to [facility] to [facility] [employer] [facility] [address]. She said that at [facility] she will have to pay more for the rx. She also needs a refill of insulin sent to [facility] on [address], or if she can pick up a sample.   04/12/17.

## 2017-04-12 NOTE — Telephone Encounter (Signed)
Patient stated pharmacy call her and said she has medication waiting to be picked up, she did not know anything about it. She is asking for a call back.

## 2017-04-13 NOTE — Telephone Encounter (Signed)
I spoke with patient & gave her new medication regimen. Patient will call in a week with BS readings.

## 2017-04-13 NOTE — Telephone Encounter (Signed)
I spoke with patient & referenced note from last week. I told patient to d/c insulin then continue Synjardy then start taking the newly prescribed Januvia. Patient stated that she would call in about a week to let us know how blood sugars were with out insulin just on new oral medication.

## 2017-04-20 ENCOUNTER — Telehealth: Payer: Self-pay | Admitting: Internal Medicine

## 2017-04-20 DIAGNOSIS — E118 Type 2 diabetes mellitus with unspecified complications: Secondary | ICD-10-CM

## 2017-04-20 DIAGNOSIS — Z794 Long term (current) use of insulin: Secondary | ICD-10-CM

## 2017-04-20 MED ORDER — ACCU-CHEK GUIDE W/DEVICE KIT: 1.0000 | PACK | Freq: Every day | 0 refills | Status: DC

## 2017-04-20 MED ORDER — GLUCOSE BLOOD VI STRP: 1.0000 | ORAL_STRIP | Freq: Every day | 3 refills | Status: DC

## 2017-04-20 MED ORDER — ACCU-CHEK SOFT TOUCH LANCETS MISC: 3 refills | Status: DC

## 2017-04-20 NOTE — Telephone Encounter (Signed)
Copied from [place]. Topic: Quick Communication - See Telephone Encounter >> Apr 20, 2017 12:41 PM Bea Graff, NT wrote: CRM for notification. See Telephone encounter for: 04/20/17. [facility] Kelp [facility] [employer] on [address] calling and states they need a new rx sent to them for the test strips, glucometer and lancets. Also with the dx code on there. The rx needs for the pt to only test once a day. CB#: [phone]

## 2017-04-20 NOTE — Telephone Encounter (Signed)
Reviewed chart pt is up-to-date sent refills to [facility].../lmb  

## 2017-04-21 ENCOUNTER — Telehealth: Payer: Self-pay | Admitting: Endocrinology

## 2017-04-21 NOTE — Telephone Encounter (Signed)
Patient is to give you b/s readings

## 2017-04-25 NOTE — Telephone Encounter (Signed)
Please verify pt is off the insulin. Please continue the same medication. I'll see you next time.

## 2017-04-25 NOTE — Telephone Encounter (Signed)
Here are patient's BS readings before breakfast fasting:  3/25 123 3/26 109 3/27 98 3/28 112 3/29 119 3/30 129 3/31 118 4/1 108 4/2 130

## 2017-04-26 ENCOUNTER — Encounter: Payer: Self-pay | Admitting: Internal Medicine

## 2017-04-26 ENCOUNTER — Ambulatory Visit (INDEPENDENT_AMBULATORY_CARE_PROVIDER_SITE_OTHER)
Admission: RE | Admit: 2017-04-26 | Discharge: 2017-04-26 | Disposition: A | Discharge status: Home / Self Care | Payer: Medicare Other | Source: Ambulatory Visit | Attending: Internal Medicine | Admitting: Internal Medicine

## 2017-04-26 ENCOUNTER — Ambulatory Visit: Payer: Medicare Other | Admitting: Internal Medicine

## 2017-04-26 VITALS — BP 134/80 | HR 88 | Temp 98.0°F | Resp 16 | Ht 68.5 in | Wt 180.0 lb

## 2017-04-26 DIAGNOSIS — M546 Pain in thoracic spine: Secondary | ICD-10-CM

## 2017-04-26 DIAGNOSIS — M545 Low back pain, unspecified: Secondary | ICD-10-CM

## 2017-04-26 DIAGNOSIS — M47814 Spondylosis without myelopathy or radiculopathy, thoracic region: Secondary | ICD-10-CM | POA: Diagnosis not present

## 2017-04-26 NOTE — Patient Instructions (Signed)

## 2017-04-26 NOTE — Progress Notes (Signed)
Subjective:  Patient ID: Nicole [name], female    DOB: [birth year]  Age: 77 y.o. MRN: [id number]  CC: Back Pain   HPI Nicole [name] presents for concerns about back pain.  She describes an intermittent, achy sensation in her mid and lower back for several months.  The pain increases with activity.  She gets symptom relief with over-the-counter remedies.  There is no pain that radiates towards her arms or legs and she denies paresthesias.  She is concerned that the pain is caused by lung cancer that has spread to her back.  Outpatient Medications Prior to Visit  Medication Sig Dispense Refill  . apixaban (ELIQUIS) 5 MG TABS tablet Take 1 tablet (5 mg total) by mouth 2 (two) times daily. 60 tablet 10  . benzonatate (TESSALON) 100 MG capsule Take 2 capsules (200 mg total) by mouth 3 (three) times daily as needed for cough. 180 capsule 3  . COLLAGEN PO Take 1 tablet by mouth daily.     [name] diltiazem (CARDIZEM CD) 120 MG 24 hr capsule Take 1 capsule (120 mg total) by mouth daily. 30 capsule 11  . Empagliflozin-Metformin HCl ER (SYNJARDY XR) 12.05-998 MG TB24 Take 1 tablet by mouth every morning. 90 tablet 1  . fluticasone (FLOVENT HFA) 110 MCG/ACT inhaler Inhale 2 puffs into the lungs 2 (two) times daily. 1 Inhaler 12  . glucose blood (ACCU-CHEK GUIDE) test strip 1 each by Other route daily. Use to check blood sugars daily DX code: E11.8 100 each 3  . HYDROcodone-homatropine (HYCODAN) 5-1.5 MG/5ML syrup Take 5 mLs by mouth every 6 (six) hours as needed for cough. 120 mL 0  . irbesartan (AVAPRO) 300 MG tablet Take 1 tablet (300 mg total) by mouth daily. 90 tablet 0  . Lancets (ACCU-CHEK SOFT TOUCH) lancets Use to check blood sugars daily Dx E11.9 100 each 3  . Multiple Vitamins-Minerals (MULTIVITAMIN WITH MINERALS) tablet Take 1 tablet by mouth daily.    . sitaGLIPtin (JANUVIA) 100 MG tablet Take 1 tablet (100 mg total) by mouth daily. 30 tablet 11  . albuterol (PROVENTIL HFA;VENTOLIN HFA) 108 (90  Base) MCG/ACT inhaler Inhale 2 puffs into the lungs every 4 (four) hours as needed for wheezing or shortness of breath. 1 Inhaler 12  . Blood Glucose Monitoring Suppl (ACCU-CHEK GUIDE) w/Device KIT 1 Device by Does not apply route daily. Use to check blood sugars daily 1 kit 0  . Lactobacillus (PROBIOTIC ACIDOPHILUS PO) Take 1 tablet by mouth daily.      No facility-administered medications prior to visit.     ROS Review of Systems  Constitutional: Negative.  Negative for chills, diaphoresis, fatigue and fever.  HENT: Negative.   Eyes: Negative for visual disturbance.  Respiratory: Positive for cough. Negative for chest tightness, shortness of breath and wheezing.   Gastrointestinal: Negative for abdominal pain, constipation, diarrhea, nausea and vomiting.  Endocrine: Negative.   Genitourinary: Negative.  Negative for difficulty urinating.  Musculoskeletal: Positive for back pain. Negative for myalgias and neck pain.  Skin: Negative.   Allergic/Immunologic: Negative.   Neurological: Negative.  Negative for dizziness, weakness, light-headedness and headaches.  Hematological: Negative for adenopathy. Does not bruise/bleed easily.  Psychiatric/Behavioral: Negative.     Objective:  BP 134/80 (BP Location: Left Arm, Patient Position: Sitting, Cuff Size: Normal)   Pulse 88   Temp 98 F (36.7 C) (Oral)   Resp 16   Ht 5' 8.5" (1.74 m)   Wt 180 lb (81.6 kg)  SpO2 96%   BMI 26.97 kg/m   BP Readings from Last 3 Encounters:  04/26/17 134/80  03/23/17 120/68  03/21/17 (!) 124/59    Wt Readings from Last 3 Encounters:  04/26/17 180 lb (81.6 kg)  03/23/17 175 lb 6.4 oz (79.6 kg)  03/21/17 172 lb 14.4 oz (78.4 kg)    Physical Exam  Constitutional: She is oriented to person, place, and time. No distress.  HENT:  Mouth/Throat: Oropharynx is clear and moist. No oropharyngeal exudate.  Eyes: Conjunctivae are normal. Left eye exhibits no discharge. No scleral icterus.  Neck: Normal  range of motion. Neck supple. No JVD present. No thyromegaly present.  Cardiovascular: Normal rate, regular rhythm and normal heart sounds. Exam reveals no gallop and no friction rub.  No murmur heard. Pulmonary/Chest: Effort normal and breath sounds normal. No respiratory distress. She has no wheezes. She has no rales.  Abdominal: Soft. Bowel sounds are normal. She exhibits no distension and no mass. There is no tenderness. There is no guarding.  Musculoskeletal: Normal range of motion. She exhibits no edema, tenderness or deformity.       Thoracic back: Normal. She exhibits normal range of motion, no tenderness, no bony tenderness, no swelling, no edema, no deformity and no pain.       Lumbar back: Normal. She exhibits normal range of motion, no tenderness, no bony tenderness, no swelling, no edema, no deformity and no pain.  Lymphadenopathy:    She has no cervical adenopathy.  Neurological: She is alert and oriented to person, place, and time.  Skin: Skin is warm and dry. No rash noted. She is not diaphoretic. No erythema. No pallor.  Vitals reviewed.   Lab Results  Component Value Date   WBC 7.3 03/21/2017   HGB 13.5 02/21/2017   HCT 38.4 03/21/2017   PLT 204 03/21/2017   GLUCOSE 186 (H) 03/21/2017   CHOL 203 (H) 02/02/2017   TRIG 147.0 02/02/2017   HDL 61.10 02/02/2017   LDLDIRECT 74.0 06/14/2016   LDLCALC 113 (H) 02/02/2017   ALT 14 03/21/2017   AST 13 03/21/2017   NA 140 03/21/2017   K 3.7 03/21/2017   CL 107 03/21/2017   CREATININE 0.91 03/21/2017   BUN 26 03/21/2017   CO2 24 03/21/2017   TSH 0.16 (L) 02/02/2017   INR 1.0 02/21/2017   HGBA1C 8.1 02/02/2017   MICROALBUR 4.0 (H) 06/14/2016    Dg Chest 2 View  Result Date: 03/10/2017 CLINICAL DATA:  Increased dyspnea, stage III lung cancer EXAM: CHEST  2 VIEW COMPARISON:  02/21/2017 FINDINGS: Right perihilar soft tissue prominence consistent with lung cancer. No other focal parenchymal opacity. No pleural effusion or  pneumothorax. Stable heart size. Thoracic aortic atherosclerosis. No acute osseous abnormality. IMPRESSION: No active cardiopulmonary disease. Stable right perihilar soft tissue prominence consistent with lung cancer. Electronically Signed   By: Kathreen Devoid   On: 03/10/2017 14:45    Assessment & Plan:   Venesha was seen today for back pain.  Diagnoses and all orders for this visit:  Lumbar back pain- Plain films are positive for degenerative changes but there is no evidence of metastatic disease.  She will continue to control the discomfort with over-the-counter remedies. -     DG Lumbar Spine Complete; Future  Bilateral thoracic back pain, unspecified chronicity- as above -     DG Thoracic Spine W/Swimmers; Future   I have discontinued Cameryn K. Grosso's Lactobacillus (PROBIOTIC ACIDOPHILUS PO), albuterol, and ACCU-CHEK GUIDE. I am also  having her maintain her multivitamin with minerals, diltiazem, COLLAGEN PO, irbesartan, benzonatate, apixaban, Empagliflozin-metFORMIN HCl ER, fluticasone, HYDROcodone-homatropine, sitaGLIPtin, glucose blood, and accu-chek soft touch.  No orders of the defined types were placed in this encounter.    Follow-up: Return if symptoms worsen or fail to improve.  Scarlette Calico, MD

## 2017-04-27 NOTE — Telephone Encounter (Signed)
I called patient & she isn't taking the insulin. I told her to continue same meds.

## 2017-05-02 NOTE — Telephone Encounter (Signed)
Dental procedure okayed per PCP.   Called number for dentist but number was busy.

## 2017-05-03 DIAGNOSIS — E119 Type 2 diabetes mellitus without complications: Secondary | ICD-10-CM | POA: Diagnosis not present

## 2017-05-04 NOTE — Telephone Encounter (Signed)
[place] office.   Letter to Teddy Spike, DDS to clear patient for dental procedures.   Fax number: [phone].    Confirmed that dental office will call pt and schedule her procedure.   Letter written and faxed back to the office.

## 2017-05-17 ENCOUNTER — Other Ambulatory Visit: Payer: Self-pay | Admitting: Internal Medicine

## 2017-05-17 DIAGNOSIS — E118 Type 2 diabetes mellitus with unspecified complications: Secondary | ICD-10-CM

## 2017-05-17 DIAGNOSIS — I10 Essential (primary) hypertension: Secondary | ICD-10-CM

## 2017-05-19 ENCOUNTER — Telehealth: Payer: Self-pay | Admitting: *Deleted

## 2017-05-19 NOTE — Telephone Encounter (Signed)
Faxed ROI to [facility], attn: Brittney Atilano Median; release [phone]

## 2017-05-22 ENCOUNTER — Ambulatory Visit (HOSPITAL_COMMUNITY)
Admission: RE | Admit: 2017-05-22 | Discharge: 2017-05-22 | Disposition: A | Discharge status: Home / Self Care | Payer: Medicare Other | Source: Ambulatory Visit | Attending: Internal Medicine | Admitting: Internal Medicine

## 2017-05-22 ENCOUNTER — Encounter (HOSPITAL_COMMUNITY): Payer: Self-pay

## 2017-05-22 ENCOUNTER — Inpatient Hospital Stay: Payer: Medicare Other | Attending: Oncology

## 2017-05-22 ENCOUNTER — Other Ambulatory Visit: Payer: Medicare Other

## 2017-05-22 DIAGNOSIS — Z85118 Personal history of other malignant neoplasm of bronchus and lung: Secondary | ICD-10-CM | POA: Diagnosis not present

## 2017-05-22 DIAGNOSIS — C3431 Malignant neoplasm of lower lobe, right bronchus or lung: Secondary | ICD-10-CM | POA: Insufficient documentation

## 2017-05-22 DIAGNOSIS — C3490 Malignant neoplasm of unspecified part of unspecified bronchus or lung: Secondary | ICD-10-CM | POA: Diagnosis not present

## 2017-05-22 DIAGNOSIS — I700 Atherosclerosis of aorta: Secondary | ICD-10-CM | POA: Insufficient documentation

## 2017-05-22 DIAGNOSIS — Z5111 Encounter for antineoplastic chemotherapy: Secondary | ICD-10-CM | POA: Diagnosis not present

## 2017-05-22 LAB — CMP (CANCER CENTER ONLY)
ALT: 11 U/L (ref 0–55)
AST: 14 U/L (ref 5–34)
Albumin: 3.6 g/dL (ref 3.5–5.0)
Alkaline Phosphatase: 77 U/L (ref 40–150)
Anion gap: 7 (ref 3–11)
BUN: 22 mg/dL (ref 7–26)
CO2: 24 mmol/L (ref 22–29)
Calcium: 9.4 mg/dL (ref 8.4–10.4)
Chloride: 109 mmol/L (ref 98–109)
Creatinine: 0.91 mg/dL (ref 0.60–1.10)
GFR, Est AFR Am: >60 mL/min (ref >60)
GFR, Estimated: >60 mL/min (ref >60)
Glucose, Bld: 90 mg/dL (ref 70–140)
Potassium: 4 mmol/L (ref 3.5–5.1)
Sodium: 140 mmol/L (ref 136–145)
Total Bilirubin: 0.5 mg/dL (ref 0.2–1.2)
Total Protein: 7.6 g/dL (ref 6.4–8.3)

## 2017-05-22 LAB — CBC WITH DIFFERENTIAL (CANCER CENTER ONLY)
Basophils Absolute: 0 10*3/uL (ref 0.0–0.1)
Basophils Relative: 0 %
Eosinophils Absolute: 0.1 10*3/uL (ref 0.0–0.5)
Eosinophils Relative: 2 %
HCT: 43.9 % (ref 34.8–46.6)
Hemoglobin: 14.3 g/dL (ref 11.6–15.9)
Lymphocytes Relative: 17 %
Lymphs Abs: 0.7 10*3/uL — ABNORMAL LOW (ref 0.9–3.3)
MCH: 29.8 pg (ref 25.1–34.0)
MCHC: 32.6 g/dL (ref 31.5–36.0)
MCV: 91.5 fL (ref 79.5–101.0)
Monocytes Absolute: 0.4 10*3/uL (ref 0.1–0.9)
Monocytes Relative: 10 %
Neutro Abs: 3 10*3/uL (ref 1.5–6.5)
Neutrophils Relative %: 71 %
Platelet Count: 181 10*3/uL (ref 145–400)
RBC: 4.8 MIL/uL (ref 3.70–5.45)
RDW: 14.5 % (ref 11.2–14.5)
WBC Count: 4.3 10*3/uL (ref 3.9–10.3)

## 2017-05-22 MED ORDER — IOHEXOL 300 MG/ML  SOLN
75.0000 mL | Freq: Once | INTRAMUSCULAR | Status: AC | PRN
  Administered 2017-05-22: 75 mL via INTRAVENOUS

## 2017-05-24 ENCOUNTER — Encounter: Payer: Self-pay | Admitting: Internal Medicine

## 2017-05-24 ENCOUNTER — Inpatient Hospital Stay: Payer: Medicare Other | Attending: Oncology | Admitting: Internal Medicine

## 2017-05-24 ENCOUNTER — Telehealth: Payer: Self-pay | Admitting: Internal Medicine

## 2017-05-24 VITALS — BP 144/75 | HR 100 | Temp 97.9°F | Resp 17 | Ht 68.5 in | Wt 176.5 lb

## 2017-05-24 DIAGNOSIS — Z923 Personal history of irradiation: Secondary | ICD-10-CM | POA: Diagnosis not present

## 2017-05-24 DIAGNOSIS — J700 Acute pulmonary manifestations due to radiation: Secondary | ICD-10-CM

## 2017-05-24 DIAGNOSIS — Z79899 Other long term (current) drug therapy: Secondary | ICD-10-CM | POA: Insufficient documentation

## 2017-05-24 DIAGNOSIS — E119 Type 2 diabetes mellitus without complications: Secondary | ICD-10-CM | POA: Insufficient documentation

## 2017-05-24 DIAGNOSIS — C3431 Malignant neoplasm of lower lobe, right bronchus or lung: Secondary | ICD-10-CM | POA: Insufficient documentation

## 2017-05-24 DIAGNOSIS — K625 Hemorrhage of anus and rectum: Secondary | ICD-10-CM | POA: Insufficient documentation

## 2017-05-24 DIAGNOSIS — I10 Essential (primary) hypertension: Secondary | ICD-10-CM | POA: Insufficient documentation

## 2017-05-24 DIAGNOSIS — I4891 Unspecified atrial fibrillation: Secondary | ICD-10-CM | POA: Insufficient documentation

## 2017-05-24 DIAGNOSIS — C3490 Malignant neoplasm of unspecified part of unspecified bronchus or lung: Secondary | ICD-10-CM

## 2017-05-24 DIAGNOSIS — C3491 Malignant neoplasm of unspecified part of right bronchus or lung: Secondary | ICD-10-CM

## 2017-05-24 NOTE — Progress Notes (Signed)
[facility] [phone]   Fax:([phone]) [phone]  OFFICE PROGRESS NOTE  Nicole Lima, MD [address] [facility] [address] [place] [address]  DIAGNOSIS: Stage IIIa (T1b, N2, M0) non-small cell lung cancer, adenocarcinoma presented with right lower lobe lung mass in addition to right hilar and subcarinal lymphadenopathy diagnosed in June 2018.  Biomarker Findings Tumor Mutational Burden - TMB-Intermediate (8 Muts/Mb) Microsatellite Status - Cannot Be Determined Genomic Findings For a complete list of the genes assayed, please refer to the Appendix. EGFR E709K, G719S MCE0EM V361Q AES9P5 splice site 300-5R>T 7 Disease relevant genes with no reportable alterations: KRAS, ALK, BRAF, MET, ERBB2, RET, ROS1  PDL1 Expression 70%.  PRIOR THERAPY: 1) Concurrent chemoradiation with weekly carboplatin for AUC of 2 and paclitaxel 45 MG/M2. Status post 7 cycles with partial response. 2) Consolidation treatment with immunotherapy with Imfinzi (Durvalumab) 10 MG/KG every 2 weeks, first dose 10/19/2016.  Status post 5 cycles.  Discontinued secondary to intolerance and persistent pneumonitis.  CURRENT THERAPY: Observation.  INTERVAL HISTORY: Nicole [name] 77 y.o. female returns to the clinic today for follow-up visit accompanied by her daughter-in-law.  The patient is feeling fine today with no specific complaints.  Her cough has significantly improved.  She has 2 episodes of rectal bleeding recently.  She was on blood thinner and she discontinued it for the last few days.  She denied having any nausea, vomiting, diarrhea or constipation.  She denied having any fever or chills.  She has no chest pain, shortness of breath, cough or hemoptysis.  The patient had repeat CT scan of the chest and she is here for evaluation and discussion of her scan results.  MEDICAL HISTORY: Past Medical History:  Diagnosis Date  . A-fib ([place])   . Adenocarcinoma of right lung, stage 3 ([place])  07/28/2016  . Bronchitis    hx of  . 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 Diabetes mellitus    type 2  . Gallstones   . Headache   . History of radiation therapy 08/09/2016 to 09/19/2016   Right lung was treated to 60 Gy in 30 fractions at 2 Gy per fraction  . Hypertension   . Low back pain   . Lung mass    with lymphadenopathy  . Osteoarthritis     ALLERGIES:  is allergic to no known allergies.  MEDICATIONS:  Current Outpatient Medications  Medication Sig Dispense Refill  . apixaban (ELIQUIS) 5 MG TABS tablet Take 1 tablet (5 mg total) by mouth 2 (two) times daily. 60 tablet 10  . benzonatate (TESSALON) 100 MG capsule Take 2 capsules (200 mg total) by mouth 3 (three) times daily as needed for cough. 180 capsule 3  . COLLAGEN PO Take 1 tablet by mouth daily.     [name] diltiazem (CARDIZEM CD) 120 MG 24 hr capsule Take 1 capsule (120 mg total) by mouth daily. 30 capsule 11  . Empagliflozin-Metformin HCl ER (SYNJARDY XR) 12.05-998 MG TB24 Take 1 tablet by mouth every morning. 90 tablet 1  . fluticasone (FLOVENT HFA) 110 MCG/ACT inhaler Inhale 2 puffs into the lungs 2 (two) times daily. 1 Inhaler 12  . glucose blood (ACCU-CHEK GUIDE) test strip 1 each by Other route daily. Use to check blood sugars daily DX code: E11.8 100 each 3  . HYDROcodone-homatropine (HYCODAN) 5-1.5 MG/5ML syrup Take 5 mLs by mouth every 6 (six) hours as needed for cough. 120 mL 0  . irbesartan (AVAPRO)  300 MG tablet TAKE 1 TABLET BY MOUTH ONCE DAILY 90 tablet 0  . Lancets (ACCU-CHEK SOFT TOUCH) lancets Use to check blood sugars daily Dx E11.9 100 each 3  . Multiple Vitamins-Minerals (MULTIVITAMIN WITH MINERALS) tablet Take 1 tablet by mouth daily.    . sitaGLIPtin (JANUVIA) 100 MG tablet Take 1 tablet (100 mg total) by mouth daily. 30 tablet 11   No current facility-administered medications for this visit.     SURGICAL HISTORY:  Past Surgical History:  Procedure Laterality Date  . ABDOMINAL HYSTERECTOMY  2004    . APPENDECTOMY  when 77 years old  . COLONOSCOPY  08-05-09   [name]  . INCONTINENCE SURGERY    . KNEE ARTHROSCOPY Left   . LEFT HEART CATH AND CORONARY ANGIOGRAPHY N/A 02/24/2017   Procedure: LEFT HEART CATH AND CORONARY ANGIOGRAPHY;  Surgeon: Jettie Booze, MD;  Location: [facility];  Service: Cardiovascular;  Laterality: N/A;  . LEFT HEART CATHETERIZATION WITH CORONARY ANGIOGRAM N/A 07/19/2013   Procedure: LEFT HEART CATHETERIZATION WITH CORONARY ANGIOGRAM;  Surgeon: Sinclair Grooms, MD;  Location: [facility] CATH LAB;  Service: Cardiovascular;  Laterality: N/A;  . POLYPECTOMY  08-05-09   2 polyps  . TOTAL KNEE ARTHROPLASTY Left 03/28/2012   Procedure: LEFT TOTAL KNEE ARTHROPLASTY;  Surgeon: Tobi Bastos, MD;  Location: [facility];  Service: Orthopedics;  Laterality: Left;  . TUBAL LIGATION    . VIDEO BRONCHOSCOPY WITH ENDOBRONCHIAL ULTRASOUND N/A 07/11/2016   Procedure: VIDEO BRONCHOSCOPY WITH ENDOBRONCHIAL ULTRASOUND;  Surgeon: Marshell Garfinkel, MD;  Location: [facility];  Service: Pulmonary;  Laterality: N/A;    REVIEW OF SYSTEMS:  Constitutional: negative Eyes: negative Ears, nose, mouth, throat, and face: negative Respiratory: positive for cough Cardiovascular: negative Gastrointestinal: positive for Rectal bleeding Genitourinary:negative Integument/breast: negative Hematologic/lymphatic: negative Musculoskeletal:negative Neurological: negative Behavioral/Psych: negative Endocrine: negative Allergic/Immunologic: negative   PHYSICAL EXAMINATION: General appearance: alert, cooperative and no distress Head: Normocephalic, without obvious abnormality, atraumatic Neck: no adenopathy, no JVD, supple, symmetrical, trachea midline and thyroid not enlarged, symmetric, no tenderness/mass/nodules Lymph nodes: Cervical, supraclavicular, and axillary nodes normal. Resp: clear to auscultation bilaterally Back: symmetric, no curvature. ROM normal. No CVA tenderness. Cardio: regular  rate and rhythm, S1, S2 normal, no murmur, click, rub or gallop GI: soft, non-tender; bowel sounds normal; no masses,  no organomegaly Extremities: extremities normal, atraumatic, no cyanosis or edema Neurologic: Alert and oriented X 3, normal strength and tone. Normal symmetric reflexes. Normal coordination and gait  ECOG PERFORMANCE STATUS: 1 - Symptomatic but completely ambulatory  Blood pressure (!) 144/75, pulse 100, temperature 97.9 F (36.6 C), temperature source Oral, resp. rate 17, height 5' 8.5" (1.74 m), weight 176 lb 8 oz (80.1 kg), SpO2 97 %.  LABORATORY DATA: Lab Results  Component Value Date   WBC 4.3 05/22/2017   HGB 14.3 05/22/2017   HCT 43.9 05/22/2017   MCV 91.5 05/22/2017   PLT 181 05/22/2017      Chemistry      Component Value Date/Time   NA 140 05/22/2017 1154   NA 140 02/21/2017 1238   NA 139 01/26/2017 0915   K 4.0 05/22/2017 1154   K 4.1 01/26/2017 0915   CL 109 05/22/2017 1154   CO2 24 05/22/2017 1154   CO2 24 01/26/2017 0915   BUN 22 05/22/2017 1154   BUN 31 (H) 02/21/2017 1238   BUN 22.0 01/26/2017 0915   CREATININE 0.91 05/22/2017 1154   CREATININE 0.9 01/26/2017 0915  Component Value Date/Time   CALCIUM 9.4 05/22/2017 1154   CALCIUM 9.2 01/26/2017 0915   ALKPHOS 77 05/22/2017 1154   ALKPHOS 72 01/26/2017 0915   AST 14 05/22/2017 1154   AST 13 01/26/2017 0915   ALT 11 05/22/2017 1154   ALT 12 01/26/2017 0915   BILITOT 0.5 05/22/2017 1154   BILITOT 0.37 01/26/2017 0915       RADIOGRAPHIC STUDIES: Dg Thoracic Spine W/swimmers  Result Date: 04/26/2017 CLINICAL DATA:  Mid and lower back pain worse on the right. History of lung cancer. EXAM: THORACIC SPINE - 3 VIEWS COMPARISON:  Previous chest radiography. FINDINGS: Mild curvature convex to the right. No evidence of thoracic spinal fracture. No focal bone lesion. Ordinary degenerative changes with marginal osteophytes. Posteromedial ribs appear negative. IMPRESSION: No acute finding.  Mild chronic curvature and ordinary degenerative spurring. Electronically Signed   By: Nelson Chimes M.D.   On: 04/26/2017 13:51   Dg Lumbar Spine Complete  Result Date: 04/26/2017 CLINICAL DATA:  Chronic right-sided back pain worsening over the last few months. History lung cancer. EXAM: LUMBAR SPINE - COMPLETE 4+ VIEW COMPARISON:  None. FINDINGS: Mild curvature convex to the left. Mild disc space narrowing throughout the lumbar region. Moderate lower lumbar facet osteoarthritis. No evidence of fracture or lytic lesion. 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 No evidence of metastatic disease or acute finding. Electronically Signed   By: Nelson Chimes M.D.   On: 04/26/2017 13:49   Ct Chest W Contrast  Result Date: 05/22/2017 CLINICAL DATA:  History of non-small cell lung cancer, chemotherapy/XRT and immunotherapy complete EXAM: CT CHEST WITH CONTRAST TECHNIQUE: Multidetector CT imaging of the chest was performed during intravenous contrast administration. CONTRAST:  69m OMNIPAQUE IOHEXOL 300 MG/ML  SOLN COMPARISON:  01/25/2017 FINDINGS: Cardiovascular: Heart is normal in size.  No pericardial effusion. No evidence of thoracic aortic aneurysm. Atherosclerotic calcifications of the aortic arch. Three vessel coronary atherosclerosis. Mediastinum/Nodes: No suspicious mediastinal lymphadenopathy. Visualized thyroid is unremarkable. Lungs/Pleura: Radiation changes in the medial right lower lobe and perihilar region. No suspicious pulmonary nodules. No focal consolidation. No pleural effusion or pneumothorax. Upper Abdomen: Visualized upper abdomen is grossly unremarkable, noting prior cholecystectomy. Musculoskeletal: Degenerative changes of the visualized thoracolumbar spine. IMPRESSION: No evidence of recurrent or metastatic disease. Radiation changes in the medial right lower lobe and perihilar region. Aortic Atherosclerosis  (ICD10-I70.0). Electronically Signed   By: SJulian HyM.D.   On: 05/22/2017 17:08    ASSESSMENT AND PLAN: This is a very pleasant 77years old Asian female recently diagnosed with a stage IIIa non-small cell4 lung cancer, adenocarcinoma. She underwent a course of concurrent chemoradiation with weekly carboplatin and paclitaxel status post 7 cycles. The patient tolerated the previous course fairly well except for mild odynophagia and dry cough. The patient was a started on consolidation treatment with immunotherapy with Imfinzi (Durvalumab) status post 5 cycles.  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 She was treated with a taper dose of prednisone. She has been in observation now for several months. The patient is feeling much better with no specific complaints except for the recent rectal bleeding. She had repeat CT scan of the chest performed recently.  I personally and independently reviewed the scan images and discussed the results with the patient and her daughter-in-law. I recommended for her to continue in observation with repeat CT scan of the chest in 4 months after she comes back from her trip  to [place]. For the rectal bleeding, this is likely secondary to hemorrhoids but I will check her stool for Hemoccult.  If there is positive Hemoccult, I will refer the patient to gastroenterology for evaluation. She was advised to call immediately if she has any concerning symptoms in the interval. The patient voices understanding of current disease status and treatment options and is in agreement with the current care plan. All questions were answered. The patient knows to call the clinic with any problems, questions or concerns. We can certainly see the patient much sooner if necessary.  Disclaimer: This note was dictated with voice recognition software. Similar sounding words can inadvertently be transcribed and may not be corrected upon review.

## 2017-05-24 NOTE — Progress Notes (Signed)
Hemocult stool card given to pt with instructions.

## 2017-05-24 NOTE — Telephone Encounter (Signed)
Appointments scheduled AVS/Calendar printed per 5/1 los

## 2017-05-25 ENCOUNTER — Ambulatory Visit: Payer: Medicare Other | Admitting: Endocrinology

## 2017-05-25 DIAGNOSIS — Z0289 Encounter for other administrative examinations: Secondary | ICD-10-CM

## 2017-05-29 ENCOUNTER — Encounter: Payer: Self-pay | Admitting: Endocrinology

## 2017-05-29 ENCOUNTER — Ambulatory Visit: Payer: Medicare Other | Admitting: Endocrinology

## 2017-05-29 VITALS — BP 122/68 | HR 100 | Wt 175.6 lb

## 2017-05-29 DIAGNOSIS — Z794 Long term (current) use of insulin: Secondary | ICD-10-CM

## 2017-05-29 DIAGNOSIS — E118 Type 2 diabetes mellitus with unspecified complications: Secondary | ICD-10-CM | POA: Diagnosis not present

## 2017-05-29 LAB — POCT GLYCOSYLATED HEMOGLOBIN (HGB A1C): Hemoglobin A1C: 6

## 2017-05-29 MED ORDER — METFORMIN HCL ER 500 MG PO TB24: 1000.0000 mg | ORAL_TABLET | Freq: Every day | ORAL | 3 refills | Status: DC

## 2017-05-29 NOTE — Progress Notes (Signed)
Subjective:    Patient ID: Nicole [name], female    DOB: [birth year], 77 y.o.   MRN: [id number]  HPI Pt returns for f/u of diabetes mellitus: DM type: 2 Dx'ed: 6659 Complications: renal insuff and CAD Therapy: 3 oral meds GDM: never DKA: never Severe hypoglycemia: never Pancreatitis: never Pancreatic imaging: 2018 CT: numerous tiny cysts in the pancreas, Other: she is not a candidate for aggressive glycemic control, due to lung cancer; she takes intermitt steroids.  Interval history: she brings a record of her cbg's which I have reviewed today. All are approx 100.  pt states she feels well in general.  She wants to reduce meds for cost reasons. Past Medical History:  Diagnosis Date  . A-fib ([place])   . Adenocarcinoma of right lung, stage 3 ([place]) 07/28/2016  . Bronchitis    hx of  . 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 Diabetes mellitus    type 2  . Gallstones   . Headache   . History of radiation therapy 08/09/2016 to 09/19/2016   Right lung was treated to 60 Gy in 30 fractions at 2 Gy per fraction  . Hypertension   . Low back pain   . Lung mass    with lymphadenopathy  . Osteoarthritis     Past Surgical History:  Procedure Laterality Date  . ABDOMINAL HYSTERECTOMY  2004  . APPENDECTOMY  when 77 years old  . COLONOSCOPY  08-05-09   [name]  . INCONTINENCE SURGERY    . KNEE ARTHROSCOPY Left   . LEFT HEART CATH AND CORONARY ANGIOGRAPHY N/A 02/24/2017   Procedure: LEFT HEART CATH AND CORONARY ANGIOGRAPHY;  Surgeon: Jettie Booze, MD;  Location: [place] [facility];  Service: Cardiovascular;  Laterality: N/A;  . LEFT HEART CATHETERIZATION WITH CORONARY ANGIOGRAM N/A 07/19/2013   Procedure: LEFT HEART CATHETERIZATION WITH CORONARY ANGIOGRAM;  Surgeon: Sinclair Grooms, MD;  Location: [facility] C[facility];  Service: Cardiovascular;  Laterality: N/A;  . POLYPECTOMY  08-05-09   2 polyps  . TOTAL KNEE ARTHROPLASTY Left 03/28/2012   Procedure: LEFT TOTAL KNEE ARTHROPLASTY;   Surgeon: Tobi Bastos, MD;  Location: [facility];  Service: Orthopedics;  Laterality: Left;  . TUBAL LIGATION    . VIDEO BRONCHOSCOPY WITH ENDOBRONCHIAL ULTRASOUND N/A 07/11/2016   Procedure: VIDEO BRONCHOSCOPY WITH ENDOBRONCHIAL ULTRASOUND;  Surgeon: Marshell Garfinkel, MD;  Location: [place];  Service: Pulmonary;  Laterality: N/A;    Social History   Socioeconomic History  . Marital status: Married    Spouse name: Not on file  . Number of children: Not on file  . Years of education: Not on file  . Highest education level: Not on file  Occupational History  . Occupation: retired    Fish farm manager: RETIRED  Social Needs  . Financial resource strain: Not on file  . Food insecurity:    Worry: Not on file    Inability: Not on file  . Transportation needs:    Medical: Not on file    Non-medical: Not on file  Tobacco Use  . Smoking status: Never Smoker  . Smokeless tobacco: Never Used  Substance and Sexual Activity  . Alcohol use: No    Alcohol/week: 0.0 oz  . Drug use: No  . Sexual activity: Not Currently  Lifestyle  . Physical activity:    Days per week: Not on file    Minutes per session: Not on file  . Stress: Not on file  Relationships  . Social connections:  Talks on phone: Not on file    Gets together: Not on file    Attends religious service: Not on file    Active member of club or organization: Not on file    Attends meetings of clubs or organizations: Not on file    Relationship status: Not on file  . Intimate partner violence:    Fear of current or ex partner: Not on file    Emotionally abused: Not on file    Physically abused: Not on file    Forced sexual activity: Not on file  Other Topics Concern  . Not on file  Social History Narrative   Regular exercise- Yes    Current Outpatient Medications on File Prior to Visit  Medication Sig Dispense Refill  . apixaban (ELIQUIS) 5 MG TABS tablet Take 1 tablet (5 mg total) by mouth 2 (two) times daily. 60 tablet 10  .  benzonatate (TESSALON) 100 MG capsule Take 2 capsules (200 mg total) by mouth 3 (three) times daily as needed for cough. 180 capsule 3  . COLLAGEN PO Take 1 tablet by mouth daily.     [name] diltiazem (CARDIZEM CD) 120 MG 24 hr capsule Take 1 capsule (120 mg total) by mouth daily. 30 capsule 11  . fluticasone (FLOVENT HFA) 110 MCG/ACT inhaler Inhale 2 puffs into the lungs 2 (two) times daily. 1 Inhaler 12  . glucose blood (ACCU-CHEK GUIDE) test strip 1 each by Other route daily. Use to check blood sugars daily DX code: E11.8 100 each 3  . HYDROcodone-homatropine (HYCODAN) 5-1.5 MG/5ML syrup Take 5 mLs by mouth every 6 (six) hours as needed for cough. 120 mL 0  . irbesartan (AVAPRO) 300 MG tablet TAKE 1 TABLET BY MOUTH ONCE DAILY 90 tablet 0  . Lancets (ACCU-CHEK SOFT TOUCH) lancets Use to check blood sugars daily Dx E11.9 100 each 3  . Multiple Vitamins-Minerals (MULTIVITAMIN WITH MINERALS) tablet Take 1 tablet by mouth daily.    . sitaGLIPtin (JANUVIA) 100 MG tablet Take 1 tablet (100 mg total) by mouth daily. 30 tablet 11   No current facility-administered medications on file prior to visit.     Allergies  Allergen Reactions  . No Known Allergies     Family History  Problem Relation Age of Onset  . Heart attack Father   . Heart disease Father   . Arthritis Other   . Cancer Other        colon, lst degree relative  . Diabetes Other        st degree relative  . Hyperlipidemia Other   . Hypertension Other   . Colon cancer Neg Hx     BP 122/68   Pulse 100   Wt 175 lb 9.6 oz (79.7 kg)   SpO2 94%   BMI 26.31 kg/m   Review of Systems She denies hypoglycemia and weight change.      Objective:   Physical Exam VITAL SIGNS:  See vs page GENERAL: no distress Pulses: dorsalis pedis intact bilat.   MSK: no deformity of the feet CV: no leg edema Skin:  no ulcer on the feet.  normal color and temp on the feet. Neuro: sensation is intact to touch on the feet   Lab Results  Component  Value Date   HGBA1C 6.0 05/29/2017   Lab Results  Component Value Date   CREATININE 0.91 05/22/2017   BUN 22 05/22/2017   NA 140 05/22/2017   K 4.0 05/22/2017   CL 109 05/22/2017  CO2 24 05/22/2017       Assessment & Plan:  Type 2 DM: well-controlled. Lung cancer: she intermitt takes steroids, so we'll follow this.     Patient Instructions  check your blood sugar once a day.  vary the time of day when you check, between before the 3 meals, and at bedtime.  also check if you have symptoms of your blood sugar being too high or too low.  please keep a record of the readings and bring it to your next appointment here (or you can bring the meter itself).  You can write it on any piece of paper.  please call us sooner if your blood sugar goes below 70, or if you have a lot of readings over 200. Please continue the same [place], and: Change the "Synjardy," to just metformin.    Please come back for a follow-up appointment in 3-4 months.

## 2017-05-29 NOTE — Patient Instructions (Addendum)
check your blood sugar once a day.  vary the time of day when you check, between before the 3 meals, and at bedtime.  also check if you have symptoms of your blood sugar being too high or too low.  please keep a record of the readings and bring it to your next appointment here (or you can bring the meter itself).  You can write it on any piece of paper.  please call us sooner if your blood sugar goes below 70, or if you have a lot of readings over 200. Please continue the same [place], and: Change the "Synjardy," to just metformin.    Please come back for a follow-up appointment in 3-4 months.

## 2017-05-30 ENCOUNTER — Other Ambulatory Visit: Payer: Self-pay

## 2017-05-30 DIAGNOSIS — C3431 Malignant neoplasm of lower lobe, right bronchus or lung: Secondary | ICD-10-CM | POA: Diagnosis not present

## 2017-05-30 DIAGNOSIS — K625 Hemorrhage of anus and rectum: Secondary | ICD-10-CM

## 2017-05-30 LAB — OCCULT BLOOD X 1 CARD TO LAB, STOOL
Fecal Occult Bld: NEGATIVE
Fecal Occult Bld: NEGATIVE
Fecal Occult Bld: NEGATIVE

## 2017-05-31 ENCOUNTER — Telehealth: Payer: Self-pay | Admitting: Medical Oncology

## 2017-05-31 NOTE — Telephone Encounter (Signed)
Fecal occult blood negative x 3 - pt notified and per Julien Nordmann may restart her Eloquis and to reach out to her cardiologist to let him know she was off of it.

## 2017-06-05 NOTE — Progress Notes (Signed)
Cardiology Office Note    Date:  06/06/2017   ID:  Nicole [name], DOB August 12, 1940, MRN [id number]  PCP:  [name], MD  Cardiologist: Larae Grooms, MD  Chief Complaint  Patient presents with  . Rectal Bleeding    History of Present Illness:  Nicole [name] is a 77 y.o. female with history of chronic atrial fibrillation on Eliquis, CAD with prior cath in 2015 demonstrating 50% RCA and circumflex and 80% first diagonal.  Patient was having worsening shortness of breath and nuclear study ordered by PCP LVEF 66% normal perfusion however intermediate risk secondary to elevated 3 times daily ratio of 1.44.  She also has history of lung cancer status post radiation and was felt she might have radiation pneumonitis and was treated with steroids which seemed to help her cough.  Cardiac catheterization 02/24/2017 showed nonobstructive CAD with 25% proximal RCA, 30% proximal circumflex, 25% mid circumflex, 30% ostial third marginal, 25% proximal and mid LAD, normal LVEF 55 to 65%, no aortic valve stenosis.  Aggressive medical therapy recommended.  Patient had some recent rectal bleeding on Eliquis was held temporarily but most recent stool cards were negative and she was restarted on her Eliquis.  Hemoglobin on 05/22/2017 was normal.  Patient states when she restarted her Eliquis she once again had bright red blood per rectum and stopped it.  She has been off it for 3 days.  No further bleeding when she stops there Eliquis.  She does have a long history of hemorrhoids.  Last colonoscopy was 3 years ago.  Denies chest pain, palpitations, dyspnea, dizziness or presyncope.  Has chronic lower extremity edema she wears compression stockings for.  Does light exercises but has been sick for the past year with the lung cancer.    Past Medical History:  Diagnosis Date  . A-fib ([place])   . Adenocarcinoma of right lung, stage 3 ([place]) 07/28/2016  . Bronchitis    hx of  . Cancer [facility]) 2004   uterine/cervical  . Diabetes mellitus    type 2  . Gallstones   . Headache   . History of radiation therapy 08/09/2016 to 09/19/2016   Right lung was treated to 60 Gy in 30 fractions at 2 Gy per fraction  . Hypertension   . Low back pain   . Lung mass    with lymphadenopathy  . Osteoarthritis     Past Surgical History:  Procedure Laterality Date  . ABDOMINAL HYSTERECTOMY  2004  . APPENDECTOMY  when 77 years old  . COLONOSCOPY  08-05-09   [name]  . INCONTINENCE SURGERY    . KNEE ARTHROSCOPY Left   . LEFT HEART CATH AND CORONARY ANGIOGRAPHY N/A 02/24/2017   Procedure: LEFT HEART CATH AND CORONARY ANGIOGRAPHY;  Surgeon: Jettie Booze, MD;  Location: [place] [facility];  Service: Cardiovascular;  Laterality: N/A;  . LEFT HEART CATHETERIZATION WITH CORONARY ANGIOGRAM N/A 07/19/2013   Procedure: LEFT HEART CATHETERIZATION WITH CORONARY ANGIOGRAM;  Surgeon: Sinclair Grooms, MD;  Location: [facility] CATH LAB;  Service: Cardiovascular;  Laterality: N/A;  . POLYPECTOMY  08-05-09   2 polyps  . TOTAL KNEE ARTHROPLASTY Left 03/28/2012   Procedure: LEFT TOTAL KNEE ARTHROPLASTY;  Surgeon: Tobi Bastos, MD;  Location: [facility];  Service: Orthopedics;  Laterality: Left;  . TUBAL LIGATION    . VIDEO BRONCHOSCOPY WITH ENDOBRONCHIAL ULTRASOUND N/A 07/11/2016   Procedure: VIDEO BRONCHOSCOPY WITH ENDOBRONCHIAL ULTRASOUND;  Surgeon: Marshell Garfinkel, MD;  Location: [place];  Service: Pulmonary;  Laterality: N/A;    Current Medications: Current Meds  Medication Sig  . benzonatate (TESSALON) 100 MG capsule Take 2 capsules (200 mg total) by mouth 3 (three) times daily as needed for cough.  . COLLAGEN PO Take 1 tablet by mouth daily.   [name] diltiazem (CARDIZEM CD) 240 MG 24 hr capsule Take 1 capsule (240 mg total) by mouth daily.  [name] glucose blood (ACCU-CHEK GUIDE) test strip 1 each by Other route daily. Use to check blood sugars daily DX code: E11.8  . HYDROcodone-homatropine (HYCODAN) 5-1.5 MG/5ML syrup Take  5 mLs by mouth every 6 (six) hours as needed for cough.  . irbesartan (AVAPRO) 300 MG tablet TAKE 1 TABLET BY MOUTH ONCE DAILY  . Lancets (ACCU-CHEK SOFT TOUCH) lancets Use to check blood sugars daily Dx E11.9  . metFORMIN (GLUCOPHAGE-XR) 500 MG 24 hr tablet Take 2 tablets (1,000 mg total) by mouth daily with breakfast.  . Multiple Vitamins-Minerals (MULTIVITAMIN WITH MINERALS) tablet Take 1 tablet by mouth daily.  . sitaGLIPtin (JANUVIA) 100 MG tablet Take 1 tablet (100 mg total) by mouth daily.  . [DISCONTINUED] diltiazem (CARDIZEM CD) 120 MG 24 hr capsule Take 1 capsule (120 mg total) by mouth daily.     Allergies:   No known allergies   Social History   Socioeconomic History  . Marital status: Married    Spouse name: Not on file  . Number of children: Not on file  . Years of education: Not on file  . Highest education level: Not on file  Occupational History  . Occupation: retired    Fish farm manager: RETIRED  Social Needs  . Financial resource strain: Not on file  . Food insecurity:    Worry: Not on file    Inability: Not on file  . Transportation needs:    Medical: Not on file    Non-medical: Not on file  Tobacco Use  . Smoking status: Never Smoker  . Smokeless tobacco: Never Used  Substance and Sexual Activity  . Alcohol use: No    Alcohol/week: 0.0 oz  . Drug use: No  . Sexual activity: Not Currently  Lifestyle  . Physical activity:    Days per week: Not on file    Minutes per session: Not on file  . Stress: Not on file  Relationships  . Social connections:    Talks on phone: Not on file    Gets together: Not on file    Attends religious service: Not on file    Active member of club or organization: Not on file    Attends meetings of clubs or organizations: Not on file    Relationship status: Not on file  Other Topics Concern  . Not on file  Social History Narrative   Regular exercise- Yes     Family History:  The patient's family history includes Arthritis in  her other; Cancer in her other; Diabetes in her other; Heart attack in her father; Heart disease in her father; Hyperlipidemia in her other; Hypertension in her other.   ROS:   Please see the history of present illness.    Review of Systems  Constitution: Negative.  HENT: Negative.   Eyes: Negative.   Cardiovascular: Positive for irregular heartbeat.  Respiratory: Negative.   Hematologic/Lymphatic: Negative.   Musculoskeletal: Negative.  Negative for joint pain.  Gastrointestinal: Positive for hematochezia and hemorrhoids.  Genitourinary: Negative.   Neurological: Negative.    All other systems reviewed and are negative.   PHYSICAL  EXAM:   VS:  BP 134/72 (BP Location: Right Arm, Patient Position: Sitting, Cuff Size: Normal)   Pulse (!) 104   Ht 5' 8.5" (1.74 m)   Wt 178 lb 1.9 oz (80.8 kg)   SpO2 98%   BMI 26.69 kg/m   Physical Exam  GEN: Well nourished, well developed, in no acute distress  Neck: no JVD, carotid bruits, or masses Cardiac:RRR; no murmurs, rubs, or gallops  Respiratory:  clear to auscultation bilaterally, normal work of breathing GI: soft, nontender, nondistended, + BS Ext: +1-2 bilateral lower extremity edema otherwise without cyanosis, clubbing,Good distal pulses bilaterally Neuro:  Alert and Oriented x 3 Psych: euthymic mood, full affect  Wt Readings from Last 3 Encounters:  06/06/17 178 lb 1.9 oz (80.8 kg)  05/29/17 175 lb 9.6 oz (79.7 kg)  05/24/17 176 lb 8 oz (80.1 kg)      Studies/Labs Reviewed:   EKG:  EKG is  ordered today.  The ekg ordered today demonstrates sinus tachycardia at 103 bpm otherwise normal.  EKG reviewed from January and she was in A. fib  Recent Labs: 09/23/2016: Magnesium 1.9 02/02/2017: Pro B Natriuretic peptide (BNP) 33.0; TSH 0.16 05/22/2017: ALT 11; BUN 22; Creatinine 0.91; Hemoglobin 14.3; Platelet Count 181; Potassium 4.0; Sodium 140   Lipid Panel    Component Value Date/Time   CHOL 203 (H) 02/02/2017 1709   TRIG  147.0 02/02/2017 1709   HDL 61.10 02/02/2017 1709   CHOLHDL 3 02/02/2017 1709   VLDL 29.4 02/02/2017 1709   LDLCALC 113 (H) 02/02/2017 1709   LDLDIRECT 74.0 06/14/2016 1356    Additional studies/ records that were reviewed today include:  Cardiac catheterization 2/1/2019Conclusion      Prox RCA lesion is 25% stenosed.  Prox Cx lesion is 30% stenosed.  Mid Cx lesion is 25% stenosed.  Ost 3rd Mrg lesion is 30% stenosed.  Prox LAD lesion is 25% stenosed.  Mid LAD lesion is 25% stenosed.  The left ventricular systolic function is normal.  LV end diastolic pressure is normal.  The left ventricular ejection fraction is 55-65% by visual estimate.  There is no aortic valve stenosis.   Nonobstructive CAD.   Continue aggressive medical therapy.      Nuclear stress test 1/17/2019Study Highlights   Addendum by Sueanne Margarita, MD on Mon Feb 13, 2017  1:18 PM   The left ventricular ejection fraction is hyperdynamic (>65%).  Nuclear stress EF: 66%.  There was no ST segment deviation noted during stress.  The perfusion study is normal.  This is an intermediate study due to transient ischemic dilatation (TID 1.44).     2D echo 9/2018Study Conclusions   - Left ventricle: Abnormal septal motion. There was mild concentric   hypertrophy. Systolic function was normal. The estimated ejection   fraction was in the range of 55% to 60%. Doppler parameters are   consistent with abnormal left ventricular relaxation (grade 1   diastolic dysfunction). - Aortic valve: Nodular calcification of the non coronary cusp.   There was mild regurgitation. - Mitral valve: Calcified annulus. Mildly thickened leaflets . - Atrial septum: A patent foramen ovale cannot be excluded. - Impressions: Abnormal GLS -12.4   Impressions:   - Abnormal GLS -12.4     ASSESSMENT:    1. Coronary artery disease involving native coronary artery of native heart without angina pectoris   2. Atrial  flutter, unspecified type ([place])   3. Essential hypertension      PLAN:  In  order of problems listed above:  CAD with nonobstructive disease on recent cardiac catheterization 02/24/2017.  Aggressive risk factor modification recommended.  No angina  Atrial flutter on Eliquis which was held for rectal bleeding which has since resolved.  CHA2DS2-VASc equals 4 for female, age, hypertension and diabetes.  Patient does have history of hemorrhoids.  Recent stool cards were negative and hemoglobin was normal 05/22/2017.  Will send to GI hopefully within a week for evaluation and recommendations on the blood thinners.  If we can get her in sooner than we will ask her to restart her Eliquis.  Heart rate is fast today.  Will increase diltiazem to 240 mg daily.  Follow-up with myself or Dr. Irish Lack in 1 month.  Essential hypertension blood pressure well controlled.    Medication Adjustments/Labs and Tests Ordered: Current medicines are reviewed at length with the patient today.  Concerns regarding medicines are outlined above.  Medication changes, Labs and Tests ordered today are listed in the Patient Instructions below. Patient Instructions  Medication Instructions:  Your physician has recommended you make the following change in your medication: 1-INCREASE Diltiazem 240 mg by mouth daily.  Labwork: NONE  Testing/Procedures: NONE  Follow-Up: Your physician wants you to follow-up in: 1 months with Dr. Irish Lack or Ermalinda Barrios PA.   Referral to GI for Rectal Bleed.    If you need a refill on your cardiac medications before your next appointment, please call your pharmacy.       Sumner Boast, PA-C  06/06/2017 9:48 AM    [facility] [address], [place], [place]  [address] Phone: [phone]; Fax: [phone]

## 2017-06-06 ENCOUNTER — Encounter: Payer: Self-pay | Admitting: Physician Assistant

## 2017-06-06 ENCOUNTER — Ambulatory Visit: Payer: Medicare Other | Admitting: Physician Assistant

## 2017-06-06 ENCOUNTER — Telehealth: Payer: Self-pay | Admitting: Internal Medicine

## 2017-06-06 VITALS — BP 134/72 | HR 104 | Ht 68.5 in | Wt 178.1 lb

## 2017-06-06 DIAGNOSIS — I4892 Unspecified atrial flutter: Secondary | ICD-10-CM

## 2017-06-06 DIAGNOSIS — I10 Essential (primary) hypertension: Secondary | ICD-10-CM

## 2017-06-06 DIAGNOSIS — I2510 Atherosclerotic heart disease of native coronary artery without angina pectoris: Secondary | ICD-10-CM

## 2017-06-06 MED ORDER — DILTIAZEM HCL ER COATED BEADS 240 MG PO CP24: 240.0000 mg | ORAL_CAPSULE | Freq: Every day | ORAL | 3 refills | Status: DC

## 2017-06-06 NOTE — Telephone Encounter (Signed)
Pt scheduled to see Ellouise Newer PA 06/08/17@2 :15pm. Please notify pt of appt.

## 2017-06-06 NOTE — Telephone Encounter (Signed)
Patient has been notified of this and will be coming to appointment on 06/08/17 at 2:15pm.

## 2017-06-06 NOTE — Patient Instructions (Addendum)
Medication Instructions:  Your physician has recommended you make the following change in your medication: 1-INCREASE Diltiazem 240 mg by mouth daily.  Labwork: NONE  Testing/Procedures: NONE  Follow-Up: Your physician wants you to follow-up in: 1 months with Dr. Irish Lack or Ermalinda Barrios PA.   Referral to GI for Rectal Bleed.    If you need a refill on your cardiac medications before your next appointment, please call your pharmacy.

## 2017-06-08 ENCOUNTER — Encounter: Payer: Self-pay | Admitting: Physician Assistant

## 2017-06-08 ENCOUNTER — Ambulatory Visit: Payer: Medicare Other | Admitting: Physician Assistant

## 2017-06-08 VITALS — BP 120/60 | HR 88 | Ht 65.0 in | Wt 180.0 lb

## 2017-06-08 DIAGNOSIS — Z7901 Long term (current) use of anticoagulants: Secondary | ICD-10-CM | POA: Diagnosis not present

## 2017-06-08 DIAGNOSIS — K625 Hemorrhage of anus and rectum: Secondary | ICD-10-CM

## 2017-06-08 DIAGNOSIS — Z8719 Personal history of other diseases of the digestive system: Secondary | ICD-10-CM

## 2017-06-08 NOTE — Patient Instructions (Signed)

## 2017-06-08 NOTE — Progress Notes (Addendum)
Chief Complaint: Rectal bleeding  HPI:    Nicole [name] is a 77 year old female with a past medical history of adenocarcinoma of the right lung diagnosed in 2018, uterine cancer, A. fib on Eliquis, and others listed below, who follows with Dr. Hilarie Fredrickson and presents to clinic today for a complaint of rectal bleeding on chronic anticoagulation.    09/09/2014 colonoscopy with mild diverticulosis in the ascending, descending and sigmoid colon.  Melanosis coli throughout the entire colon.  Otherwise normal.  Was recommended patient have referral for large external and smaller internal hemorrhoids at that time.  Patient did have previous history of colon adenoma in 2011.      Today, explains that she was diagnosed with A. fib in September 2018 and started on Eliquis, she did fine for a few months but then started with bright red blood with and without bowel movements.  She stopped this medication and went to see her "cancer doctor" who did some Hemoccults, these were negative so she was told to restart her Eliquis.  Upon restarting her Eliquis she started again with bright red blood with and without bowel movements.  She recently discontinued her Eliquis again of her own accord on 06/02/2017.  Since that time patient has seen no further bright red blood with bowel movements or without.    Patient did see her cardiologist on 06/06/2017 who was aware she had held her Eliquis and recommended a prompt eval by our service so she could be restarted on anticoagulation.    Denies fever, chills, weight loss, change in bowel habits, shortness of breath or palpitations.  Past Medical History:  Diagnosis Date  . A-fib ([place])   . Adenocarcinoma of right lung, stage 3 ([place]) 07/28/2016  . Bronchitis    hx of  . Cancer [facility]) 2004   uterine/cervical  . Diabetes mellitus    type 2  . Gallstones   . Headache   . History of radiation therapy 08/09/2016 to 09/19/2016   Right lung was treated to 60 Gy in 30 fractions at 2 Gy per  fraction  . Hypertension   . Low back pain   . Lung mass    with lymphadenopathy  . Osteoarthritis     Past Surgical History:  Procedure Laterality Date  . ABDOMINAL HYSTERECTOMY  2004  . APPENDECTOMY  when 77 years old  . COLONOSCOPY  08-05-09   [name]  . INCONTINENCE SURGERY    . KNEE ARTHROSCOPY Left   . LEFT HEART CATH AND CORONARY ANGIOGRAPHY N/A 02/24/2017   Procedure: LEFT HEART CATH AND CORONARY ANGIOGRAPHY;  Surgeon: Jettie Booze, MD;  Location: [facility];  Service: Cardiovascular;  Laterality: N/A;  . LEFT HEART CATHETERIZATION WITH CORONARY ANGIOGRAM N/A 07/19/2013   Procedure: LEFT HEART CATHETERIZATION WITH CORONARY ANGIOGRAM;  Surgeon: Sinclair Grooms, MD;  Location: [facility] CATH LAB;  Service: Cardiovascular;  Laterality: N/A;  . POLYPECTOMY  08-05-09   2 polyps  . TOTAL KNEE ARTHROPLASTY Left 03/28/2012   Procedure: LEFT TOTAL KNEE ARTHROPLASTY;  Surgeon: Tobi Bastos, MD;  Location: [facility];  Service: Orthopedics;  Laterality: Left;  . TUBAL LIGATION    . VIDEO BRONCHOSCOPY WITH ENDOBRONCHIAL ULTRASOUND N/A 07/11/2016   Procedure: VIDEO BRONCHOSCOPY WITH ENDOBRONCHIAL ULTRASOUND;  Surgeon: Marshell Garfinkel, MD;  Location: [place];  Service: Pulmonary;  Laterality: N/A;    Current Outpatient Medications  Medication Sig Dispense Refill  . benzonatate (TESSALON) 100 MG capsule Take 2 capsules (200 mg total) by  mouth 3 (three) times daily as needed for cough. 180 capsule 3  . COLLAGEN PO Take 1 tablet by mouth daily.     [name] diltiazem (CARDIZEM CD) 240 MG 24 hr capsule Take 1 capsule (240 mg total) by mouth daily. 90 capsule 3  . glucose blood (ACCU-CHEK GUIDE) test strip 1 each by Other route daily. Use to check blood sugars daily DX code: E11.8 100 each 3  . HYDROcodone-homatropine (HYCODAN) 5-1.5 MG/5ML syrup Take 5 mLs by mouth every 6 (six) hours as needed for cough. 120 mL 0  . irbesartan (AVAPRO) 300 MG tablet TAKE 1 TABLET BY MOUTH ONCE DAILY 90 tablet  0  . Lancets (ACCU-CHEK SOFT TOUCH) lancets Use to check blood sugars daily Dx E11.9 100 each 3  . metFORMIN (GLUCOPHAGE-XR) 500 MG 24 hr tablet Take 2 tablets (1,000 mg total) by mouth daily with breakfast. 180 tablet 3  . Multiple Vitamins-Minerals (MULTIVITAMIN WITH MINERALS) tablet Take 1 tablet by mouth daily.    . sitaGLIPtin (JANUVIA) 100 MG tablet Take 1 tablet (100 mg total) by mouth daily. 30 tablet 11  . apixaban (ELIQUIS) 5 MG TABS tablet Take 1 tablet (5 mg total) by mouth 2 (two) times daily. (Patient not taking: Reported on 06/08/2017) 60 tablet 10   No current facility-administered medications for this visit.     Allergies as of 06/08/2017 - Review Complete 06/08/2017  Allergen Reaction Noted  . No known allergies  07/08/2016    Family History  Problem Relation Age of Onset  . Heart attack Father   . Heart disease Father   . Arthritis Other   . Cancer Other        colon, lst degree relative  . Diabetes Other        st degree relative  . Hyperlipidemia Other   . Hypertension Other   . Colon cancer Neg Hx     Social History   Socioeconomic History  . Marital status: Married    Spouse name: Not on file  . Number of children: Not on file  . Years of education: Not on file  . Highest education level: Not on file  Occupational History  . Occupation: retired    Fish farm manager: RETIRED  Social Needs  . Financial resource strain: Not on file  . Food insecurity:    Worry: Not on file    Inability: Not on file  . Transportation needs:    Medical: Not on file    Non-medical: Not on file  Tobacco Use  . Smoking status: Never Smoker  . Smokeless tobacco: Never Used  Substance and Sexual Activity  . Alcohol use: No    Alcohol/week: 0.0 oz  . Drug use: No  . Sexual activity: Not Currently  Lifestyle  . Physical activity:    Days per week: Not on file    Minutes per session: Not on file  . Stress: Not on file  Relationships  . Social connections:    Talks on  phone: Not on file    Gets together: Not on file    Attends religious service: Not on file    Active member of club or organization: Not on file    Attends meetings of clubs or organizations: Not on file    Relationship status: Not on file  . Intimate partner violence:    Fear of current or ex partner: Not on file    Emotionally abused: Not on file    Physically abused: Not on file  Forced sexual activity: Not on file  Other Topics Concern  . Not on file  Social History Narrative   Regular exercise- Yes    Review of Systems:    Constitutional: No weight loss, fever or chills Cardiovascular: No chest pain Respiratory: No SOB  Gastrointestinal: See HPI and otherwise negative   Physical Exam:  Vital signs: BP 120/60 (BP Location: Left Arm, Patient Position: Sitting, Cuff Size: Normal)   Pulse 88   Ht 5' 5" (1.651 m) Comment: height measured without shoes  Wt 180 lb (81.6 kg)   BMI 29.95 kg/m   Constitutional:   Pleasant AA female appears to be in NAD, Well developed, Well nourished, alert and cooperative Respiratory: Respirations even and unlabored. Lungs clear to auscultation bilaterally.   No wheezes, crackles, or rhonchi.  Cardiovascular: Normal S1, S2. No MRG. Regular rate and rhythm. No peripheral edema, cyanosis or pallor.  Gastrointestinal:  Soft, nondistended, nontender. No rebound or guarding. Normal bowel sounds. No appreciable masses or hepatomegaly. Rectal:  Not performed. (patient opted to wait for time of colonoscopy) Psychiatric: Demonstrates good judgement and reason without abnormal affect or behaviors.  [facility]: CBC    Component Value Date/Time   WBC 4.3 05/22/2017 1154   WBC 10.9 (H) 02/17/2017 1714   RBC 4.80 05/22/2017 1154   HGB 14.3 05/22/2017 1154   HGB 13.5 02/21/2017 1238   HGB 13.0 01/26/2017 0915   HCT 43.9 05/22/2017 1154   HCT 42.3 02/21/2017 1238   HCT 39.3 01/26/2017 0915   PLT 181 05/22/2017 1154   PLT 186  02/21/2017 1238   MCV 91.5 05/22/2017 1154   MCV 87 02/21/2017 1238   MCV 86.7 01/26/2017 0915   MCH 29.8 05/22/2017 1154   MCHC 32.6 05/22/2017 1154   RDW 14.5 05/22/2017 1154   RDW 15.8 (H) 02/21/2017 1238   RDW 14.5 01/26/2017 0915   LYMPHSABS 0.7 (L) 05/22/2017 1154   LYMPHSABS 0.5 (L) 01/26/2017 0915   MONOABS 0.4 05/22/2017 1154   MONOABS 0.5 01/26/2017 0915   EOSABS 0.1 05/22/2017 1154   EOSABS 0.2 01/26/2017 0915   BASOSABS 0.0 05/22/2017 1154   BASOSABS 0.0 01/26/2017 0915    CMP     Component Value Date/Time   NA 140 05/22/2017 1154   NA 140 02/21/2017 1238   NA 139 01/26/2017 0915   K 4.0 05/22/2017 1154   K 4.1 01/26/2017 0915   CL 109 05/22/2017 1154   CO2 24 05/22/2017 1154   CO2 24 01/26/2017 0915   GLUCOSE 90 05/22/2017 1154   GLUCOSE 128 01/26/2017 0915   BUN 22 05/22/2017 1154   BUN 31 (H) 02/21/2017 1238   BUN 22.0 01/26/2017 0915   CREATININE 0.91 05/22/2017 1154   CREATININE 0.9 01/26/2017 0915   CALCIUM 9.4 05/22/2017 1154   CALCIUM 9.2 01/26/2017 0915   PROT 7.6 05/22/2017 1154   PROT 7.3 01/26/2017 0915   ALBUMIN 3.6 05/22/2017 1154   ALBUMIN 2.9 (L) 01/26/2017 0915   AST 14 05/22/2017 1154   AST 13 01/26/2017 0915   ALT 11 05/22/2017 1154   ALT 12 01/26/2017 0915   ALKPHOS 77 05/22/2017 1154   ALKPHOS 72 01/26/2017 0915   BILITOT 0.5 05/22/2017 1154   BILITOT 0.37 01/26/2017 0915   GFRNONAA >60 05/22/2017 1154   GFRAA >60 05/22/2017 1154    Assessment: 1.  Rectal bleeding: After starting Eliquis for A. fib in September, bleeding stops when patient stops blood thinner, history of large  hemorrhoids, also history of adenomatous polyps in the past, also with uterine cancer and recent lung cancer diagnosis; consider most likely hemorrhoids exacerbated by anticoagulation versus polyps versus cancer 2.  History of large internal and external hemorrhoids: Seen at time of last colonoscopy 09/09/2014, referred to surgery at that time 3.  Chronic  anticoagulation for A. fib: With Eliquis started 08/2016, held for the past 6 days per pt  Plan: 1.  Patient told to continue to hold her Eliquis for now.  We will contact her cardiologist to ensure that holding her Eliquis until time of procedure is acceptable for her. 2.  Patient scheduled for colonoscopy in 6 days with Dr. Hilarie Fredrickson.  Discussed risk, benefits, limitations and alternatives and the patient agrees to proceed. 3.  Patient to follow in clinic per recommendations after time of colonoscopy.  Ellouise Newer, PA-C  Addendum: Reviewed and agree with initial management. Jerene Bears, MD   [facility] 06/08/2017, 2:34 PM  Cc: [name], MD

## 2017-06-12 ENCOUNTER — Encounter: Payer: Self-pay | Admitting: Internal Medicine

## 2017-06-14 ENCOUNTER — Encounter: Payer: Medicare Other | Admitting: Internal Medicine

## 2017-06-14 ENCOUNTER — Telehealth: Payer: Self-pay

## 2017-06-14 ENCOUNTER — Other Ambulatory Visit: Payer: Self-pay | Admitting: Internal Medicine

## 2017-06-14 DIAGNOSIS — R05 Cough: Secondary | ICD-10-CM

## 2017-06-14 DIAGNOSIS — R053 Chronic cough: Secondary | ICD-10-CM

## 2017-06-14 DIAGNOSIS — J700 Acute pulmonary manifestations due to radiation: Secondary | ICD-10-CM

## 2017-06-14 MED ORDER — HYDROCODONE-HOMATROPINE 5-1.5 MG/5ML PO SYRP: 5.0000 mL | ORAL_SOLUTION | Freq: Four times a day (QID) | ORAL | 0 refills | Status: DC | PRN

## 2017-06-14 NOTE — Telephone Encounter (Signed)
Pt is requesting an alternative to the benzonatate due to insurance not wanting to cover her refill. Please advise if there is an alternative she can use without codeine in it. Please advise.

## 2017-06-14 NOTE — Telephone Encounter (Signed)
RX sent

## 2017-06-15 NOTE — Telephone Encounter (Signed)
Pt informed rx was sent in.  

## 2017-06-22 ENCOUNTER — Other Ambulatory Visit: Payer: Self-pay

## 2017-06-22 ENCOUNTER — Ambulatory Visit (AMBULATORY_SURGERY_CENTER): Payer: Medicare Other | Admitting: Internal Medicine

## 2017-06-22 ENCOUNTER — Encounter: Payer: Self-pay | Admitting: Internal Medicine

## 2017-06-22 VITALS — BP 103/62 | HR 65 | Temp 99.3°F | Resp 14 | Ht 65.0 in | Wt 180.0 lb

## 2017-06-22 DIAGNOSIS — K625 Hemorrhage of anus and rectum: Secondary | ICD-10-CM | POA: Diagnosis present

## 2017-06-22 DIAGNOSIS — E119 Type 2 diabetes mellitus without complications: Secondary | ICD-10-CM | POA: Diagnosis not present

## 2017-06-22 DIAGNOSIS — I4892 Unspecified atrial flutter: Secondary | ICD-10-CM | POA: Diagnosis not present

## 2017-06-22 DIAGNOSIS — I2510 Atherosclerotic heart disease of native coronary artery without angina pectoris: Secondary | ICD-10-CM | POA: Diagnosis not present

## 2017-06-22 DIAGNOSIS — K649 Unspecified hemorrhoids: Secondary | ICD-10-CM | POA: Diagnosis not present

## 2017-06-22 MED ORDER — SODIUM CHLORIDE 0.9 % IV SOLN: 500.0000 mL | Freq: Once | INTRAVENOUS | Status: DC

## 2017-06-22 NOTE — Op Note (Signed)
[facility] Patient Name: Nicole [name] Procedure Date: 06/22/2017 2:40 PM MRN: [id number] Endoscopist: Jerene Bears , MD Age: 77 Referring MD:  Date of Birth: [birth year] Gender: Female Account #: [id number] Procedure:                Colonoscopy Indications:              Rectal bleeding, last colonoscopy Aug 2016 (normal                            except for hemorrhoids) Medicines:                Monitored Anesthesia Care Procedure:                Pre-Anesthesia Assessment:                           - Prior to the procedure, a History and Physical                            was performed, and patient medications and                            allergies were reviewed. The patient's tolerance of                            previous anesthesia was also reviewed. The risks                            and benefits of the procedure and the sedation                            options and risks were discussed with the patient.                            All questions were answered, and informed consent                            was obtained. Prior Anticoagulants: The patient has                            taken Eliquis (apixaban), last dose was 21 days                            prior to procedure. ASA Grade Assessment: III - A                            patient with severe systemic disease. After                            reviewing the risks and benefits, the patient was                            deemed in satisfactory condition to undergo the  procedure.                           After obtaining informed consent, the colonoscope                            was passed under direct vision. Throughout the                            procedure, the patient's blood pressure, pulse, and                            oxygen saturations were monitored continuously. The                            Colonoscope was introduced through the anus and      advanced to the cecum, identified by appendiceal                            orifice and ileocecal valve. The colonoscopy was                            performed without difficulty. The patient tolerated                            the procedure well. The quality of the bowel                            preparation was good. The ileocecal valve,                            appendiceal orifice, and rectum were photographed. Scope In: 2:42:23 PM Scope Out: 2:55:35 PM Scope Withdrawal Time: 0 hours 10 minutes 58 seconds  Total Procedure Duration: 0 hours 13 minutes 12 seconds  Findings:                 Hemorrhoids were found on perianal exam.                           Scattered small-mouthed diverticula were found in                            the sigmoid colon, descending colon and ascending                            colon.                           External and internal hemorrhoids were found during                            retroflexion, during perianal exam and during                            digital exam. The hemorrhoids were medium and  large-sized.                           The exam was otherwise without abnormality. Complications:            No immediate complications. Estimated Blood Loss:     Estimated blood loss: none. Impression:               - Mild diverticulosis in the sigmoid colon, in the                            descending colon and in the ascending colon.                           - External and internal hemorrhoids.                           - The examination was otherwise normal.                           - No specimens collected. Recommendation:           - Patient has a contact number available for                            emergencies. The signs and symptoms of potential                            delayed complications were discussed with the                            patient. Return to normal activities tomorrow.                             Written discharge instructions were provided to the                            patient.                           - Resume previous diet.                           - Continue present medications.                           - Rectal bleeding is felt secondary to hemorrhoids                            in the setting of anticoagulation. If bleeding                            continues, then referral to surgery for                            hemorrhoidectomy is recommended.                           -  No repeat colonoscopy due to age at next                            screening exam (10 years and age > 25) and the                            absence of advanced adenomas. Jerene Bears, MD 06/22/2017 3:03:00 PM This report has been signed electronically.

## 2017-06-22 NOTE — Progress Notes (Signed)
Report given to PACU, vss 

## 2017-06-22 NOTE — Patient Instructions (Signed)
YOU HAD AN ENDOSCOPIC PROCEDURE TODAY AT [facility]:   Refer to the procedure report that was given to you for any specific questions about what was found during the examination.  If the procedure report does not answer your questions, please call your gastroenterologist to clarify.  If you requested that your care partner not be given the details of your procedure findings, then the procedure report has been included in a sealed envelope for you to review at your convenience later.  YOU SHOULD EXPECT: Some feelings of bloating in the abdomen. Passage of more gas than usual.  Walking can help get rid of the air that was put into your GI tract during the procedure and reduce the bloating. If you had a lower endoscopy (such as a colonoscopy or flexible sigmoidoscopy) you may notice spotting of blood in your stool or on the toilet paper. If you underwent a bowel prep for your procedure, you may not have a normal bowel movement for a few days.  Please Note:  You might notice some irritation and congestion in your nose or some drainage.  This is from the oxygen used during your procedure.  There is no need for concern and it should clear up in a day or so.  SYMPTOMS TO REPORT IMMEDIATELY:   Following lower endoscopy (colonoscopy or flexible sigmoidoscopy):  Excessive amounts of blood in the stool  Significant tenderness or worsening of abdominal pains  Swelling of the abdomen that is new, acute  Fever of 100F or higher ** handout on hemorrhoids given*  For urgent or emergent issues, a gastroenterologist can be reached at any hour by calling [phone].   DIET:  We do recommend a small meal at first, but then you may proceed to your regular diet.  Drink plenty of fluids but you should avoid alcoholic beverages for 24 hours.  ACTIVITY:  You should plan to take it easy for the rest of today and you should NOT DRIVE or use heavy machinery until tomorrow (because of the sedation  medicines used during the test).    FOLLOW UP: Our staff will call the number listed on your records the next business day following your procedure to check on you and address any questions or concerns that you may have regarding the information given to you following your procedure. If we do not reach you, we will leave a message.  However, if you are feeling well and you are not experiencing any problems, there is no need to return our call.  We will assume that you have returned to your regular daily activities without incident.  If any biopsies were taken you will be contacted by phone or by letter within the next 1-3 weeks.  Please call us at [phone] if you have not heard about the biopsies in 3 weeks.    SIGNATURES/CONFIDENTIALITY: You and/or your care partner have signed paperwork which will be entered into your electronic medical record.  These signatures attest to the fact that that the information above on your After Visit Summary has been reviewed and is understood.  Full responsibility of the confidentiality of this discharge information lies with you and/or your care-partner.

## 2017-06-23 ENCOUNTER — Telehealth: Payer: Self-pay | Admitting: *Deleted

## 2017-06-23 ENCOUNTER — Telehealth: Payer: Self-pay

## 2017-06-23 LAB — HM COLONOSCOPY

## 2017-06-23 NOTE — Telephone Encounter (Signed)
  Follow up Call-  Call back number 06/22/2017  Post procedure Call Back phone  # [phone]  Permission to leave phone message Yes  Some recent data might be hidden     Patient questions:  Message left to call us if necessary.

## 2017-06-23 NOTE — Telephone Encounter (Signed)
  Follow up Call-  Call back number 06/22/2017  Post procedure Call Back phone  # [phone]  Permission to leave phone message Yes  Some recent data might be hidden     Patient questions:  Do you have a fever, pain , or abdominal swelling? No. Pain Score  0 *  Have you tolerated food without any problems? Yes.    Have you been able to return to your normal activities? Yes.    Do you have any questions about your discharge instructions: Diet   No. Medications  No. Follow up visit  No.  Do you have questions or concerns about your Care? No.  Actions: * If pain score is 4 or above: No action needed, pain <4.

## 2017-07-11 NOTE — Progress Notes (Signed)
Cardiology Office Note    Date:  07/12/2017   ID:  Nicole [name], DOB Aug 06, 1940, MRN [id number]  PCP:  [name], MD  Cardiologist: Larae Grooms, MD  Chief Complaint  Patient presents with  . Follow-up    History of Present Illness:  Nicole [name] is a 77 y.o. female with history of chronic atrial fibrillation on Eliquis, CAD with prior cath in 2015 demonstrating 50% RCA and circumflex and 80% first diagonal.  Patient was having worsening shortness of breath and nuclear study ordered by PCP LVEF 66% normal perfusion however intermediate risk secondary to elevated 3 times daily ratio of 1.44.  She also has history of lung cancer status post radiation and was felt she might have radiation pneumonitis and was treated with steroids which seemed to help her cough.  Cardiac catheterization 02/24/2017 showed nonobstructive CAD with 25% proximal RCA, 30% proximal circumflex, 25% mid circumflex, 30% ostial third marginal, 25% proximal and mid LAD, normal LVEF 55 to 65%, no aortic valve stenosis.  Aggressive medical therapy recommended.  I saw the patient 06/06/2017 at which time she was having rectal bleeding on Eliquis felt secondary to hemorrhoids.  She had stopped her Eliquis and the rectal bleeding resolved.  Stool cards were negative and hemoglobin was stable.  Colonoscopy found diverticulosis as well as hemorrhoids which seem to be the cause of the rectal bleeding.  If recurrent rectal bleeding he recommended surgical removal of the hemorrhoids.  Patient comes in today for follow-up.  The bleeding has improved and she only had 1 day of very minor blood on her toilet paper.  She is under a lot of stress because her husband just came home from [facility] last night and she is having to take care of his IVs and he is using her walker that she usually brings in.  She was rushing to get here.  She took her increase diltiazem dose at 9 AM but she is still tachycardic.  She said when  she will runs 1 [place] during the day she is completely exhausted and does not want to do anything else.  She says she is used to being very active.  She does not feel her heart racing but it is 106 bpm this morning.  Usually when she checks it at home meds in the 80s which has improved from the 90s.       Past Medical History:  Diagnosis Date  . A-fib ([place])   . Adenocarcinoma of right lung, stage 3 ([place]) 07/28/2016  . Bronchitis    hx of  . 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 Diabetes mellitus    type 2  . Gallstones   . Headache   . History of radiation therapy 08/09/2016 to 09/19/2016   Right lung was treated to 60 Gy in 30 fractions at 2 Gy per fraction  . Hypertension   . Low back pain   . Lung mass    with lymphadenopathy  . Osteoarthritis     Past Surgical History:  Procedure Laterality Date  . ABDOMINAL HYSTERECTOMY  2004  . APPENDECTOMY  when 77 years old  . COLONOSCOPY  08-05-09   [name]  . INCONTINENCE SURGERY    . KNEE ARTHROSCOPY Left   . LEFT HEART CATH AND CORONARY ANGIOGRAPHY N/A 02/24/2017   Procedure: LEFT HEART CATH AND CORONARY ANGIOGRAPHY;  Surgeon: Jettie Booze, MD;  Location: [facility];  Service: Cardiovascular;  Laterality: N/A;  .  LEFT HEART CATHETERIZATION WITH CORONARY ANGIOGRAM N/A 07/19/2013   Procedure: LEFT HEART CATHETERIZATION WITH CORONARY ANGIOGRAM;  Surgeon: Sinclair Grooms, MD;  Location: [facility] CATH [facility];  Service: Cardiovascular;  Laterality: N/A;  . POLYPECTOMY  08-05-09   2 polyps  . TOTAL KNEE ARTHROPLASTY Left 03/28/2012   Procedure: LEFT TOTAL KNEE ARTHROPLASTY;  Surgeon: Tobi Bastos, MD;  Location: [facility];  Service: Orthopedics;  Laterality: Left;  . TUBAL LIGATION    . VIDEO BRONCHOSCOPY WITH ENDOBRONCHIAL ULTRASOUND N/A 07/11/2016   Procedure: VIDEO BRONCHOSCOPY WITH ENDOBRONCHIAL ULTRASOUND;  Surgeon: Marshell Garfinkel, MD;  Location: [facility];  Service: Pulmonary;  Laterality: N/A;    Current Medications: Current  Meds  Medication Sig  . apixaban (ELIQUIS) 5 MG TABS tablet Take 5 mg by mouth 2 (two) times daily.  . benzonatate (TESSALON) 100 MG capsule Take 2 capsules (200 mg total) by mouth 3 (three) times daily as needed for cough.  . COLLAGEN PO Take 1 tablet by mouth daily.   [name] diltiazem (CARDIZEM CD) 240 MG 24 hr capsule Take 1 capsule (240 mg total) by mouth daily.  [name] glucose blood (ACCU-CHEK GUIDE) test strip 1 each by Other route daily. Use to check blood sugars daily DX code: E11.8  . HYDROcodone-homatropine (HYCODAN) 5-1.5 MG/5ML syrup Take 5 mLs by mouth every 6 (six) hours as needed for cough.  . irbesartan (AVAPRO) 300 MG tablet TAKE 1 TABLET BY MOUTH ONCE DAILY  . Lancets (ACCU-CHEK SOFT TOUCH) lancets Use to check blood sugars daily Dx E11.9  . metFORMIN (GLUCOPHAGE-XR) 500 MG 24 hr tablet Take 2 tablets (1,000 mg total) by mouth daily with breakfast.  . Multiple Vitamins-Minerals (MULTIVITAMIN WITH MINERALS) tablet Take 1 tablet by mouth daily.  . sitaGLIPtin (JANUVIA) 100 MG tablet Take 1 tablet (100 mg total) by mouth daily.   Current Facility-Administered Medications for the 07/12/17 encounter (Office Visit) with Imogene Burn, PA-C  Medication  . 0.9 %  sodium chloride infusion     Allergies:   No known allergies   Social History   Socioeconomic History  . Marital status: Married    Spouse name: Not on file  . Number of children: Not on file  . Years of education: Not on file  . Highest education level: Not on file  Occupational History  . Occupation: retired    Fish farm manager: RETIRED  Social Needs  . Financial resource strain: Not on file  . Food insecurity:    Worry: Not on file    Inability: Not on file  . Transportation needs:    Medical: Not on file    Non-medical: Not on file  Tobacco Use  . Smoking status: Never Smoker  . Smokeless tobacco: Never Used  Substance and Sexual Activity  . Alcohol use: No    Alcohol/week: 0.0 oz  . Drug use: No  . Sexual  activity: Not Currently  Lifestyle  . Physical activity:    Days per week: Not on file    Minutes per session: Not on file  . Stress: Not on file  Relationships  . Social connections:    Talks on phone: Not on file    Gets together: Not on file    Attends religious service: Not on file    Active member of club or organization: Not on file    Attends meetings of clubs or organizations: Not on file    Relationship status: Not on file  Other Topics Concern  . Not on  file  Social History Narrative   Regular exercise- Yes     Family History:  The patient's family history includes Arthritis in her other; Cancer in her other; Diabetes in her other; Heart attack in her father; Heart disease in her father; Hyperlipidemia in her other; Hypertension in her other.   ROS:   Please see the history of present illness.    Review of Systems  Constitution: Negative.  HENT: Negative.   Eyes: Negative.   Cardiovascular: Positive for dyspnea on exertion and leg swelling.  Respiratory: Positive for cough.   Hematologic/Lymphatic: Negative.   Musculoskeletal: Negative.  Negative for joint pain.  Gastrointestinal: Positive for hematochezia and hemorrhoids.  Genitourinary: Negative.   Neurological: Negative.    All other systems reviewed and are negative.   PHYSICAL EXAM:   VS:  BP 128/70   Pulse (!) 106   Ht 5\' 5"  (1.651 m)   Wt 176 lb (79.8 kg)   SpO2 95%   BMI 29.29 kg/m   Physical Exam  GEN: Well nourished, well developed, in no acute distress  Neck: no JVD, carotid bruits, or masses Cardiac:RRR; no murmurs, rubs, or gallops  Respiratory:  clear to auscultation bilaterally, normal work of breathing GI: soft, nontender, nondistended, + BS Ext: without cyanosis, clubbing, or edema, Good distal pulses bilaterally Neuro:  Alert and Oriented x 3 Psych: euthymic mood, full affect  Wt Readings from Last 3 Encounters:  07/12/17 176 lb (79.8 kg)  06/22/17 180 lb (81.6 kg)  06/08/17 180  lb (81.6 kg)      Studies/Labs Reviewed:   EKG:  EKG is  ordered today.  The ekg ordered today demonstrates sinus tachycardia at 106 bpm  Recent Labs: 09/23/2016: Magnesium 1.9 02/02/2017: Pro B Natriuretic peptide (BNP) 33.0; TSH 0.16 05/22/2017: ALT 11; BUN 22; Creatinine 0.91; Hemoglobin 14.3; Platelet Count 181; Potassium 4.0; Sodium 140   Lipid Panel    Component Value Date/Time   CHOL 203 (H) 02/02/2017 1709   TRIG 147.0 02/02/2017 1709   HDL 61.10 02/02/2017 1709   CHOLHDL 3 02/02/2017 1709   VLDL 29.4 02/02/2017 1709   LDLCALC 113 (H) 02/02/2017 1709   LDLDIRECT 74.0 06/14/2016 1356    Additional studies/ records that were reviewed today include:  Cardiac catheterization 2/1/2019Conclusion       Prox RCA lesion is 25% stenosed.  Prox Cx lesion is 30% stenosed.  Mid Cx lesion is 25% stenosed.  Ost 3rd Mrg lesion is 30% stenosed.  Prox LAD lesion is 25% stenosed.  Mid LAD lesion is 25% stenosed.  The left ventricular systolic function is normal.  LV end diastolic pressure is normal.  The left ventricular ejection fraction is 55-65% by visual estimate.  There is no aortic valve stenosis.   Nonobstructive CAD.   Continue aggressive medical therapy.       Nuclear stress test 1/17/2019Study Highlights    Addendum by Sueanne Margarita, MD on Mon Feb 13, 2017  1:18 PM   The left ventricular ejection fraction is hyperdynamic (>65%).  Nuclear stress EF: 66%.  There was no ST segment deviation noted during stress.  The perfusion study is normal.  This is an intermediate study due to transient ischemic dilatation (TID 1.44).      2D echo 9/2018Study Conclusions   - Left ventricle: Abnormal septal motion. There was mild concentric   hypertrophy. Systolic function was normal. The estimated ejection   fraction was in the range of 55% to 60%. Doppler parameters are  consistent with abnormal left ventricular relaxation (grade 1   diastolic  dysfunction). - Aortic valve: Nodular calcification of the non coronary cusp.   There was mild regurgitation. - Mitral valve: Calcified annulus. Mildly thickened leaflets . - Atrial septum: A patent foramen ovale cannot be excluded. - Impressions: Abnormal GLS -12.4   Impressions:   - Abnormal GLS -12.4         ASSESSMENT:    1. Coronary artery disease involving native coronary artery of native heart without angina pectoris   2. Atrial flutter, unspecified type ([place])   3. Tachycardia   4. Essential hypertension   5. Edema, unspecified type      PLAN:  In order of problems listed above:  CAD nonobstructive disease on cardiac catheterization 02/24/2017.  Recommend aggressive risk factor modification.  Atrial flutter on Eliquis for CHA2DS2-VASc of 4.  Recent trouble with rectal bleeding so I sent her to see GI.  I also increased her diltiazem to 240 mg daily for better heart rate control.  Colonoscopy 06/22/2017 showed mild diverticulosis in the sigmoid colon descending colon and ascending colon external and internal hemorrhoids which were felt to be the cause of rectal bleeding.  If rectal bleeding continues he recommended referral to surgeon for hemorrhoidectomy.  Bleeding has subsided but patient is still tachycardic at 106 bpm after walking in here.  She is asymptomatic with this other than dyspnea on exertion and feeling fatigued after running an errand.  Will place a 48-hour monitor to see what her heart rate is doing.  Follow-up with myself or Dr. Irish Lack after the monitor.  Essential hypertension blood pressure well controlled  Chronic lower extremity edema managed with compression stockings   Medication Adjustments/Labs and Tests Ordered: Current medicines are reviewed at length with the patient today.  Concerns regarding medicines are outlined above.  Medication changes, Labs and Tests ordered today are listed in the Patient Instructions below. Patient Instructions   Medication Instructions:  Your physician recommends that you continue on your current medications as directed. Please refer to the Current Medication list given to you today.   Labwork: None ordered  Testing/Procedures: Your physician has recommended that you wear a holter monitor. Holter monitors are medical devices that record the heart's electrical activity. Doctors most often use these monitors to diagnose arrhythmias. Arrhythmias are problems with the speed or rhythm of the heartbeat. The monitor is a small, portable device. You can wear one while you do your normal daily activities. This is usually used to diagnose what is causing palpitations/syncope (passing out).    Follow-Up: Your physician recommends that you schedule a follow-up appointment in: [place] DR. VARANASI OR Ryleigh Buenger, PA-C   Any Other Special Instructions Will Be Listed Below (If Applicable).  Holter Monitoring A Holter monitor is a small device that is used to detect abnormal heart rhythms. It clips to your clothing and is connected by wires to flat, sticky disks (electrodes) that attach to your chest. It is worn continuously for 24-48 hours. Follow these instructions at home:  Wear your Holter monitor at all times, even while exercising and sleeping, for as long as directed by your health care provider.  Make sure that the Holter monitor is safely clipped to your clothing or close to your body as recommended by your health care provider.  Do not get the monitor or wires wet.  Do not put body lotion or moisturizer on your chest.  Keep your skin clean.  Keep a diary of  your daily activities, such as walking and doing chores. If you feel that your heartbeat is abnormal or that your heart is fluttering or skipping a beat: ? Record what you are doing when it happens. ? Record what time of day the symptoms occur.  Return your Holter monitor as directed by your health care provider.  Keep all  follow-up visits as directed by your health care provider. This is important. Get help right away if:  You feel lightheaded or you faint.  You have trouble breathing.  You feel pain in your chest, upper arm, or jaw.  You feel sick to your stomach and your skin is pale, cool, or damp.  You heartbeat feels unusual or abnormal. This information is not intended to replace advice given to you by your health care provider. Make sure you discuss any questions you have with your health care provider. Document Released: 10/09/2003 Document Revised: 06/18/2015 Document Reviewed: 08/19/2013 Elsevier Interactive Patient Education  [employer].     If you need a refill on your cardiac medications before your next appointment, please call your pharmacy.      Sumner Boast, PA-C  07/12/2017 11:00 AM    [facility] [address], [place], [place]  [address] Phone: [phone]; Fax: [phone]

## 2017-07-12 ENCOUNTER — Ambulatory Visit: Payer: Medicare Other | Admitting: Physician Assistant

## 2017-07-12 ENCOUNTER — Encounter: Payer: Self-pay | Admitting: Physician Assistant

## 2017-07-12 VITALS — BP 128/70 | HR 106 | Ht 65.0 in | Wt 176.0 lb

## 2017-07-12 DIAGNOSIS — I4892 Unspecified atrial flutter: Secondary | ICD-10-CM | POA: Diagnosis not present

## 2017-07-12 DIAGNOSIS — I10 Essential (primary) hypertension: Secondary | ICD-10-CM

## 2017-07-12 DIAGNOSIS — I2510 Atherosclerotic heart disease of native coronary artery without angina pectoris: Secondary | ICD-10-CM

## 2017-07-12 DIAGNOSIS — R000 Tachycardia, unspecified: Secondary | ICD-10-CM

## 2017-07-12 DIAGNOSIS — R609 Edema, unspecified: Secondary | ICD-10-CM

## 2017-07-12 HISTORY — DX: Edema, unspecified: R60.9

## 2017-07-12 NOTE — Patient Instructions (Signed)
Medication Instructions:  Your physician recommends that you continue on your current medications as directed. Please refer to the Current Medication list given to you today.   Labwork: None ordered  Testing/Procedures: Your physician has recommended that you wear a holter monitor. Holter monitors are medical devices that record the heart's electrical activity. Doctors most often use these monitors to diagnose arrhythmias. Arrhythmias are problems with the speed or rhythm of the heartbeat. The monitor is a small, portable device. You can wear one while you do your normal daily activities. This is usually used to diagnose what is causing palpitations/syncope (passing out).    Follow-Up: Your physician recommends that you schedule a follow-up appointment in: [place] DR. VARANASI OR MICHELE LENZE, PA-C   Any Other Special Instructions Will Be Listed Below (If Applicable).  Holter Monitoring A Holter monitor is a small device that is used to detect abnormal heart rhythms. It clips to your clothing and is connected by wires to flat, sticky disks (electrodes) that attach to your chest. It is worn continuously for 24-48 hours. Follow these instructions at home:  Wear your Holter monitor at all times, even while exercising and sleeping, for as long as directed by your health care provider.  Make sure that the Holter monitor is safely clipped to your clothing or close to your body as recommended by your health care provider.  Do not get the monitor or wires wet.  Do not put body lotion or moisturizer on your chest.  Keep your skin clean.  Keep a diary of your daily activities, such as walking and doing chores. If you feel that your heartbeat is abnormal or that your heart is fluttering or skipping a beat: ? Record what you are doing when it happens. ? Record what time of day the symptoms occur.  Return your Holter monitor as directed by your health care provider.  Keep all follow-up  visits as directed by your health care provider. This is important. Get help right away if:  You feel lightheaded or you faint.  You have trouble breathing.  You feel pain in your chest, upper arm, or jaw.  You feel sick to your stomach and your skin is pale, cool, or damp.  You heartbeat feels unusual or abnormal. This information is not intended to replace advice given to you by your health care provider. Make sure you discuss any questions you have with your health care provider. Document Released: 10/09/2003 Document Revised: 06/18/2015 Document Reviewed: 08/19/2013 Elsevier Interactive Patient Education  [employer].     If you need a refill on your cardiac medications before your next appointment, please call your pharmacy.

## 2017-07-13 NOTE — Addendum Note (Signed)
Addended by: Rose Phi on: 07/13/2017 04:53 PM   Modules accepted: Orders

## 2017-07-20 ENCOUNTER — Ambulatory Visit (INDEPENDENT_AMBULATORY_CARE_PROVIDER_SITE_OTHER): Payer: Medicare Other

## 2017-07-20 DIAGNOSIS — R000 Tachycardia, unspecified: Secondary | ICD-10-CM

## 2017-07-28 ENCOUNTER — Telehealth: Payer: Self-pay

## 2017-07-28 NOTE — Telephone Encounter (Signed)
Patient made aware of results. Patient verbalizes understanding.

## 2017-07-28 NOTE — Telephone Encounter (Signed)
-----   Message from Imogene Burn, PA-C sent at 07/28/2017  9:58 AM EDT ----- Can you let patient know her monitor looked good with NSR with pac's and pvc's. No fast or slow heart rates ----- Message ----- From: Jettie Booze, MD Sent: 07/27/2017  10:01 AM To: Imogene Burn, PA-C, [name], MD

## 2017-08-03 ENCOUNTER — Encounter: Payer: Self-pay | Admitting: Internal Medicine

## 2017-08-03 ENCOUNTER — Ambulatory Visit: Payer: Medicare Other | Admitting: Internal Medicine

## 2017-08-03 ENCOUNTER — Encounter

## 2017-08-03 VITALS — BP 130/70 | HR 107 | Temp 98.0°F | Resp 16 | Ht 65.0 in | Wt 179.0 lb

## 2017-08-03 DIAGNOSIS — M150 Primary generalized (osteo)arthritis: Secondary | ICD-10-CM

## 2017-08-03 DIAGNOSIS — G8929 Other chronic pain: Secondary | ICD-10-CM

## 2017-08-03 DIAGNOSIS — I10 Essential (primary) hypertension: Secondary | ICD-10-CM

## 2017-08-03 DIAGNOSIS — M159 Polyosteoarthritis, unspecified: Secondary | ICD-10-CM

## 2017-08-03 DIAGNOSIS — M8949 Other hypertrophic osteoarthropathy, multiple sites: Secondary | ICD-10-CM

## 2017-08-03 DIAGNOSIS — M546 Pain in thoracic spine: Secondary | ICD-10-CM | POA: Diagnosis not present

## 2017-08-03 DIAGNOSIS — E118 Type 2 diabetes mellitus with unspecified complications: Secondary | ICD-10-CM | POA: Diagnosis not present

## 2017-08-03 LAB — POCT GLYCOSYLATED HEMOGLOBIN (HGB A1C): Hemoglobin A1C: 6.6 % — AB (ref 4.0–5.6)

## 2017-08-03 LAB — POCT UA - MICROALBUMIN
Creatinine, POC: 50 mg/dL
Microalbumin Ur, POC: 80 mg/L

## 2017-08-03 MED ORDER — CELECOXIB 200 MG PO CAPS: 200.0000 mg | ORAL_CAPSULE | Freq: Two times a day (BID) | ORAL | 1 refills | Status: DC

## 2017-08-03 NOTE — Progress Notes (Signed)
Subjective:  Patient ID: Nicole [name], female    DOB: 07-Jan-1941  Age: 77 y.o. MRN: [id number]  CC: Osteoarthritis; Hypertension; Diabetes; and Back Pain   HPI Nicole [name] presents for f/up - She complains of chronic arthritis pain in both knees.  She also complains of chronic low back pain.  She is tried Tylenol but wants to try something a little more potent than that.  She is not interested in taking a narcotic.  She has stopped taking Januvia because it was too expensive.  She feels like her blood sugars have been well controlled.  Outpatient Medications Prior to Visit  Medication Sig Dispense Refill  . apixaban (ELIQUIS) 5 MG TABS tablet Take 5 mg by mouth 2 (two) times daily.    . COLLAGEN PO Take 1 tablet by mouth daily.     [name] diltiazem (CARDIZEM CD) 240 MG 24 hr capsule Take 1 capsule (240 mg total) by mouth daily. 90 capsule 3  . glucose blood (ACCU-CHEK GUIDE) test strip 1 each by Other route daily. Use to check blood sugars daily DX code: E11.8 100 each 3  . irbesartan (AVAPRO) 300 MG tablet TAKE 1 TABLET BY MOUTH ONCE DAILY 90 tablet 0  . Lancets (ACCU-CHEK SOFT TOUCH) lancets Use to check blood sugars daily Dx E11.9 100 each 3  . metFORMIN (GLUCOPHAGE-XR) 500 MG 24 hr tablet Take 2 tablets (1,000 mg total) by mouth daily with breakfast. 180 tablet 3  . Multiple Vitamins-Minerals (MULTIVITAMIN WITH MINERALS) tablet Take 1 tablet by mouth daily.    . sitaGLIPtin (JANUVIA) 100 MG tablet Take 1 tablet (100 mg total) by mouth daily. 30 tablet 11  . benzonatate (TESSALON) 100 MG capsule Take 2 capsules (200 mg total) by mouth 3 (three) times daily as needed for cough. (Patient not taking: Reported on 08/03/2017) 180 capsule 3  . HYDROcodone-homatropine (HYCODAN) 5-1.5 MG/5ML syrup Take 5 mLs by mouth every 6 (six) hours as needed for cough. (Patient not taking: Reported on 08/03/2017) 240 mL 0  . 0.9 %  sodium chloride infusion      No facility-administered medications prior to  visit.     ROS Review of Systems  Constitutional: Negative.  Negative for appetite change, diaphoresis, fatigue and unexpected weight change.  HENT: Negative.  Negative for sinus pressure and trouble swallowing.   Eyes: Negative for visual disturbance.  Respiratory: Negative.  Negative for apnea, choking, shortness of breath and wheezing.   Cardiovascular: Negative for chest pain, palpitations and leg swelling.  Gastrointestinal: Negative.  Negative for abdominal pain, constipation, diarrhea, nausea and vomiting.  Endocrine: Negative.  Negative for polydipsia, polyphagia and polyuria.  Genitourinary: Negative for decreased urine volume, difficulty urinating, dysuria and urgency.  Musculoskeletal: Positive for arthralgias and back pain. Negative for joint swelling and myalgias.  Skin: Negative for color change, pallor and rash.  Allergic/Immunologic: Negative.   Neurological: Negative.  Negative for dizziness, weakness and light-headedness.  Hematological: Negative for adenopathy. Does not bruise/bleed easily.  Psychiatric/Behavioral: Negative.     Objective:  BP 130/70 (BP Location: Left Arm, Patient Position: Sitting, Cuff Size: Normal)   Pulse (!) 107   Temp 98 F (36.7 C) (Oral)   Resp 16   Ht 5\' 5"  (1.651 m)   Wt 179 lb (81.2 kg)   SpO2 98%   BMI 29.79 kg/m   BP Readings from Last 3 Encounters:  08/03/17 130/70  07/12/17 128/70  06/22/17 103/62    Wt Readings from Last 3 Encounters:  08/03/17 179 lb (81.2 kg)  07/12/17 176 lb (79.8 kg)  06/22/17 180 lb (81.6 kg)    Physical Exam  Constitutional: She is oriented to person, place, and time. No distress.  HENT:  Mouth/Throat: Oropharynx is clear and moist. No oropharyngeal exudate.  Eyes: Conjunctivae are normal. No scleral icterus.  Neck: Normal range of motion. Neck supple. No JVD present. No thyromegaly present.  Cardiovascular: Normal rate, regular rhythm and normal heart sounds. Exam reveals no friction rub.    No murmur heard. Pulmonary/Chest: Effort normal and breath sounds normal. No respiratory distress. She has no wheezes. She has no rales.  Abdominal: Soft. Normal appearance and bowel sounds are normal. She exhibits no mass. There is no hepatosplenomegaly. There is no tenderness.  Musculoskeletal: Normal range of motion. She exhibits no edema, tenderness or deformity.       Right knee: She exhibits normal range of motion, no swelling, no erythema and no bony tenderness. Deformity: DJD. No tenderness found.       Left knee: She exhibits normal range of motion, no swelling, no effusion, no erythema and no bony tenderness. Deformity: DJD.  Lymphadenopathy:    She has no cervical adenopathy.  Neurological: She is alert and oriented to person, place, and time.  Skin: Skin is warm and dry. She is not diaphoretic. No pallor.  Vitals reviewed.   Lab Results  Component Value Date   WBC 4.3 05/22/2017   HGB 14.3 05/22/2017   HCT 43.9 05/22/2017   PLT 181 05/22/2017   GLUCOSE 90 05/22/2017   CHOL 203 (H) 02/02/2017   TRIG 147.0 02/02/2017   HDL 61.10 02/02/2017   LDLDIRECT 74.0 06/14/2016   LDLCALC 113 (H) 02/02/2017   ALT 11 05/22/2017   AST 14 05/22/2017   NA 140 05/22/2017   K 4.0 05/22/2017   CL 109 05/22/2017   CREATININE 0.91 05/22/2017   BUN 22 05/22/2017   CO2 24 05/22/2017   TSH 0.16 (L) 02/02/2017   INR 1.0 02/21/2017   HGBA1C 6.6 (A) 08/03/2017   MICROALBUR 80 08/03/2017    Ct Chest W Contrast  Result Date: 05/22/2017 CLINICAL DATA:  History of non-small cell lung cancer, chemotherapy/XRT and immunotherapy complete EXAM: CT CHEST WITH CONTRAST TECHNIQUE: Multidetector CT imaging of the chest was performed during intravenous contrast administration. CONTRAST:  7mL OMNIPAQUE IOHEXOL 300 MG/ML  SOLN COMPARISON:  01/25/2017 FINDINGS: Cardiovascular: Heart is normal in size.  No pericardial effusion. No evidence of thoracic aortic aneurysm. Atherosclerotic calcifications of the  aortic arch. Three vessel coronary atherosclerosis. Mediastinum/Nodes: No suspicious mediastinal lymphadenopathy. Visualized thyroid is unremarkable. Lungs/Pleura: Radiation changes in the medial right lower lobe and perihilar region. No suspicious pulmonary nodules. No focal consolidation. No pleural effusion or pneumothorax. Upper Abdomen: Visualized upper abdomen is grossly unremarkable, noting prior cholecystectomy. Musculoskeletal: Degenerative changes of the visualized thoracolumbar spine. IMPRESSION: No evidence of recurrent or metastatic disease. Radiation changes in the medial right lower lobe and perihilar region. Aortic Atherosclerosis (ICD10-I70.0). Electronically Signed   By: Julian Hy M.D.   On: 05/22/2017 17:08    Assessment & Plan:   Ramina was seen today for osteoarthritis, hypertension, diabetes and back pain.  Diagnoses and all orders for this visit:  Primary osteoarthritis involving multiple joints -     celecoxib (CELEBREX) 200 MG capsule; Take 1 capsule (200 mg total) by mouth 2 (two) times daily.  Essential hypertension- Her blood pressure is adequately well controlled.  Type 2 diabetes mellitus with complication, without long-term current  use of insulin ([place])- Her A1c is at 6.6%.  Her blood sugars are adequately well ontrolled with metformin as monotherapy. -     POCT glycosylated hemoglobin (Hb A1C) -     POCT UA - Microalbumin  Chronic bilateral thoracic back pain   I have discontinued Georgetta K. Brodhead's multivitamin with minerals, benzonatate, sitaGLIPtin, and HYDROcodone-homatropine. I am also having her start on celecoxib. Additionally, I am having her maintain her COLLAGEN PO, glucose blood, accu-chek soft touch, irbesartan, metFORMIN, diltiazem, and apixaban. We will stop administering sodium chloride.  Meds ordered this encounter  Medications  . celecoxib (CELEBREX) 200 MG capsule    Sig: Take 1 capsule (200 mg total) by mouth 2 (two) times daily.     Dispense:  90 capsule    Refill:  1     Follow-up: No follow-ups on file.  Scarlette Calico, MD

## 2017-08-04 ENCOUNTER — Encounter: Payer: Self-pay | Admitting: Internal Medicine

## 2017-08-04 NOTE — Patient Instructions (Signed)

## 2017-08-12 ENCOUNTER — Other Ambulatory Visit: Payer: Self-pay | Admitting: Internal Medicine

## 2017-08-12 DIAGNOSIS — E118 Type 2 diabetes mellitus with unspecified complications: Secondary | ICD-10-CM

## 2017-08-12 DIAGNOSIS — I10 Essential (primary) hypertension: Secondary | ICD-10-CM

## 2017-08-15 DIAGNOSIS — R600 Localized edema: Secondary | ICD-10-CM | POA: Insufficient documentation

## 2017-08-15 NOTE — Progress Notes (Signed)
Cardiology Office Note    Date:  08/16/2017   ID:  Nicole, [name] 08/31/1940, MRN [id number]  PCP:  Nicole Lima, MD  Cardiologist: Nicole Grooms, MD  Chief Complaint  Patient presents with  . Follow-up    History of Present Illness:  Nicole [name] is a 77 y.o. female with history of chronic atrial fibrillation on Eliquis, CAD with prior cath in 2015 demonstrating 50% RCA and circumflex and 80% first diagonal.  Patient was having worsening shortness of breath and nuclear study ordered by PCP LVEF 66% normal perfusion however intermediate risk secondary to elevated 3 times daily ratio of 1.44.  She also has history of lung cancer status post radiation and was felt she might have radiation pneumonitis and was treated with steroids which seemed to help her cough.  Cardiac catheterization 02/24/2017 showed nonobstructive CAD with 25% proximal RCA, 30% proximal circumflex, 25% mid circumflex, 30% ostial third marginal, 25% proximal and mid LAD, normal LVEF 55 to 65%, no aortic valve stenosis.  Aggressive medical therapy recommended.   I saw the patient 06/06/2017 at which time she was having rectal bleeding on Eliquis felt secondary to hemorrhoids.  She had stopped her Eliquis and the rectal bleeding resolved.  Stool cards were negative and hemoglobin was stable.  Colonoscopy found diverticulosis as well as hemorrhoids which seem to be the cause of the rectal bleeding.  If recurrent rectal bleeding he recommended surgical removal of the hemorrhoids.  I saw the patient most recently 07/12/2017 and the bleeding had improved.  She was under a lot of stress taking care of her husband who was in the hospital.  She was tachycardic in the office despite taking the increase diltiazem at 9 AM that day.  I placed to 48-hour monitor and she had normal sinus rhythm with occasional PACs PVCs no significant bradycardia and average heart rate 96 bpm.  Patient comes in today for follow-up.  She has been  rushing around and her blood pressure is high and her heart rate is high.  She brings a list of blood pressures from home and they have been running high.  She is from [place] and eats a lot of soy sauce.  She is taking care of her husband who was in the hospital with sepsis.  She is also taking care of 2 of her grandchildren home who is autistic because her daughter injured her hand.  Also has chronic lower extremity edema and that she wears compression stockings for.     Past Medical History:  Diagnosis Date  . A-fib ([place])   . Adenocarcinoma of right lung, stage 3 ([place]) 07/28/2016  . Atrial flutter ([place]) 10/05/2016  . Bronchitis    hx of  . Cancer [facility]) 2004   uterine/cervical  . Cancer-related pain 10/06/2016  . Coronary artery disease 02/21/2017   JAN 2019 Prox RCA lesion is 25% stenosed. Prox Cx lesion is 30% stenosed. Mid Cx lesion is 25% stenosed. Ost 3rd Mrg lesion is 30% stenosed. Prox LAD lesion is 25% stenosed. Mid LAD lesion is 25% stenosed. The left ventricular systolic function is normal. LV end diastolic pressure is normal. The left ventricular ejection fraction is 55-65% by visual estimate. There is no aortic valve stenosis.   N  . Diabetes mellitus    type 2  . Edema 07/12/2017  . Essential hypertension 01/20/2009  . Gallstones   . Headache   . History of radiation therapy 08/09/2016 to 09/19/2016   Right  lung was treated to 60 Gy in 30 fractions at 2 Gy per fraction  . Hypertension   . Hypertriglyceridemia 08/22/2012  . Low back pain   . Lung mass    with lymphadenopathy  . Osteoarthritis   . Type II diabetes mellitus with manifestations ([place]) 01/20/2009   Estimated Creatinine Clearance: 49.8 mL/min (by C-G formula based on SCr of 1 mg/dL).    Past Surgical History:  Procedure Laterality Date  . ABDOMINAL HYSTERECTOMY  2004  . APPENDECTOMY  when 77 years old  . COLONOSCOPY  08-05-09   Nicole [name]  . INCONTINENCE SURGERY    . KNEE ARTHROSCOPY Left   . LEFT HEART CATH  AND CORONARY ANGIOGRAPHY N/A 02/24/2017   Procedure: LEFT HEART CATH AND CORONARY ANGIOGRAPHY;  Surgeon: Nicole Booze, MD;  Location: [facility];  Service: Cardiovascular;  Laterality: N/A;  . LEFT HEART CATHETERIZATION WITH CORONARY ANGIOGRAM N/A 07/19/2013   Procedure: LEFT HEART CATHETERIZATION WITH CORONARY ANGIOGRAM;  Surgeon: Nicole Grooms, MD;  [facility];  Service: Cardiovascular;  Laterality: N/A;  . POLYPECTOMY  08-05-09   2 polyps  . TOTAL KNEE ARTHROPLASTY Left 03/28/2012   Procedure: LEFT TOTAL KNEE ARTHROPLASTY;  Surgeon: Nicole Bastos, MD;  Location: [facility];  Service: Orthopedics;  Laterality: Left;  . TUBAL LIGATION    . VIDEO BRONCHOSCOPY WITH ENDOBRONCHIAL ULTRASOUND N/A 07/11/2016   Procedure: VIDEO BRONCHOSCOPY WITH ENDOBRONCHIAL ULTRASOUND;  Surgeon: Nicole Garfinkel, MD;  Location: [facility];  Service: Pulmonary;  Laterality: N/A;    Current Medications: Current Meds  Medication Sig  . apixaban (ELIQUIS) 5 MG TABS tablet Take 5 mg by mouth 2 (two) times daily.  . COLLAGEN PO Take 1 tablet by mouth daily.   [name] glucose blood (ACCU-CHEK GUIDE) test strip 1 each by Other route daily. Use to check blood sugars daily DX code: E11.8  . irbesartan (AVAPRO) 300 MG tablet TAKE 1 TABLET BY MOUTH ONCE DAILY  . Lancets (ACCU-CHEK SOFT TOUCH) lancets Use to check blood sugars daily Dx E11.9  . metFORMIN (GLUCOPHAGE-XR) 500 MG 24 hr tablet Take 2 tablets (1,000 mg total) by mouth daily with breakfast.  . [DISCONTINUED] diltiazem (CARDIZEM CD) 240 MG 24 hr capsule Take 1 capsule (240 mg total) by mouth daily.     Allergies:   No known allergies   Social History   Socioeconomic History  . Marital status: Married    Spouse name: Not on file  . Number of children: Not on file  . Years of education: Not on file  . Highest education level: Not on file  Occupational History  . Occupation: retired    Fish farm manager: RETIRED  Social Needs  . Financial resource  strain: Not on file  . Food insecurity:    Worry: Not on file    Inability: Not on file  . Transportation needs:    Medical: Not on file    Non-medical: Not on file  Tobacco Use  . Smoking status: Never Smoker  . Smokeless tobacco: Never Used  Substance and Sexual Activity  . Alcohol use: No    Alcohol/week: 0.0 oz  . Drug use: No  . Sexual activity: Not Currently  Lifestyle  . Physical activity:    Days per week: Not on file    Minutes per session: Not on file  . Stress: Not on file  Relationships  . Social connections:    Talks on phone: Not on file    Gets together:  Not on file    Attends religious service: Not on file    Active member of club or organization: Not on file    Attends meetings of clubs or organizations: Not on file    Relationship status: Not on file  Other Topics Concern  . Not on file  Social History Narrative   Regular exercise- Yes     Family History:  The patient's family history includes Arthritis in her other; Cancer in her other; Diabetes in her other; Heart attack in her father; Heart disease in her father; Hyperlipidemia in her other; Hypertension in her other.   ROS:   Please see the history of present illness.    Review of Systems  Constitution: Negative.  HENT: Negative.   Eyes: Negative.   Cardiovascular: Positive for leg swelling.  Respiratory: Negative.   Hematologic/Lymphatic: Negative.   Musculoskeletal: Positive for back pain. Negative for joint pain.  Gastrointestinal: Negative.   Genitourinary: Negative.   Neurological: Negative.    All other systems reviewed and are negative.   PHYSICAL EXAM:   VS:  BP (!) 148/90   Pulse 98   Ht 5\' 5"  (1.651 m)   Wt 177 lb 12.8 oz (80.6 kg)   SpO2 94%   BMI 29.59 kg/m   Physical Exam  GEN: Well nourished, well developed, in no acute distress  HEENT: normal  Neck: no JVD, carotid bruits, or masses Cardiac:RRR at 98 bpm; no murmurs, rubs, or gallops  Respiratory:  clear to  auscultation bilaterally, normal work of breathing GI: soft, nontender, nondistended, + BS Ext: Mild edema bilaterally without cyanosis, clubbing,  Good distal pulses bilaterally Neuro:  Alert and Oriented x 3 Psych: euthymic mood, full affect  Wt Readings from Last 3 Encounters:  08/16/17 177 lb 12.8 oz (80.6 kg)  08/03/17 179 lb (81.2 kg)  07/12/17 176 lb (79.8 kg)      Studies/Labs Reviewed:   EKG:  EKG is not ordered today.    Recent Labs: 09/23/2016: Magnesium 1.9 02/02/2017: Pro B Natriuretic peptide (BNP) 33.0; TSH 0.16 05/22/2017: ALT 11; BUN 22; Creatinine 0.91; Hemoglobin 14.3; Platelet Count 181; Potassium 4.0; Sodium 140   Lipid Panel    Component Value Date/Time   CHOL 203 (H) 02/02/2017 1709   TRIG 147.0 02/02/2017 1709   HDL 61.10 02/02/2017 1709   CHOLHDL 3 02/02/2017 1709   VLDL 29.4 02/02/2017 1709   LDLCALC 113 (H) 02/02/2017 1709   LDLDIRECT 74.0 06/14/2016 1356    Additional studies/ records that were reviewed today include:   Holter monitor 07/20/2017 Study Highlights    NSR with occasional PACs and PVCs  No significant bradycardia.  Average HR 96 bpm.  No pathologic arrhtyhmias.     cardiac catheterization 2/1/2019Conclusion       Prox RCA lesion is 25% stenosed.  Prox Cx lesion is 30% stenosed.  Mid Cx lesion is 25% stenosed.  Ost 3rd Mrg lesion is 30% stenosed.  Prox LAD lesion is 25% stenosed.  Mid LAD lesion is 25% stenosed.  The left ventricular systolic function is normal.  LV end diastolic pressure is normal.  The left ventricular ejection fraction is 55-65% by visual estimate.  There is no aortic valve stenosis.   Nonobstructive CAD.   Continue aggressive medical therapy.       Nuclear stress test 1/17/2019Study Highlights    Addendum by Sueanne Margarita, MD on Mon Feb 13, 2017  1:18 PM   The left ventricular ejection fraction is hyperdynamic (>65%).  Nuclear stress EF: 66%.  There was no ST segment  deviation noted during stress.  The perfusion study is normal.  This is an intermediate study due to transient ischemic dilatation (TID 1.44).      2D echo 9/2018Study Conclusions   - Left ventricle: Abnormal septal motion. There was mild concentric   hypertrophy. Systolic function was normal. The estimated ejection   fraction was in the range of 55% to 60%. Doppler parameters are   consistent with abnormal left ventricular relaxation (grade 1   diastolic dysfunction). - Aortic valve: Nodular calcification of the non coronary cusp.   There was mild regurgitation. - Mitral valve: Calcified annulus. Mildly thickened leaflets . - Atrial septum: A patent foramen ovale cannot be excluded. - Impressions: Abnormal GLS -12.4   Impressions:   - Abnormal GLS -12.4           ASSESSMENT:    1. Coronary artery disease involving native coronary artery of native heart without angina pectoris   2. Atrial flutter, unspecified type ([place])   3. Essential hypertension   4. Hypertriglyceridemia   5. Lower extremity edema   6. Type 2 diabetes mellitus with complication, without long-term current use of insulin (HCC)      PLAN:  In order of problems listed above:  CAD nonobstructive on cardiac catheterization 02/24/2017 aggressive risk factor modification recommended.  Atrial flutter on Eliquis for chads vas score of 4.  Recent rectal bleeding resolved but if recurs referral to surgeon for hemorrhoidectomy was recommended by GI.  Also tachycardia and Cardizem increased to 240 mg daily.  48-hour Holter showed stable heart rate average 96 bpm occasional PACs and PVCs.  Heart rate is 98 in the office today blood pressure is up.  Will increase diltiazem to 360 mg once daily.  I will see her back in 6 weeks.  Essential hypertension blood pressure high, increasing diltiazem.  May need low-dose diuretic with lower extremity edema if it still is high next office visit.  Chronic lower extremity edema  managed with compression stockings.  Diabetes mellitus on metformin.  Blood sugars running high at home.  Follow-up with PCP.  Medication Adjustments/Labs and Tests Ordered: Current medicines are reviewed at length with the patient today.  Concerns regarding medicines are outlined above.  Medication changes, Labs and Tests ordered today are listed in the Patient Instructions below. Patient Instructions   Medication Instructions:  1. INCREASE DILTIAZEM TO 360 MG DAILY.   Labwork: NONE ORDERED TODAY  Testing/Procedures: NONE ORDERED TODAY  Follow-Up: Your physician recommends that you schedule a follow-up appointment with Ermalinda Barrios, NP on August 28th at 11:30 a.m.  Any Other Special Instructions Will Be Listed Below (If Applicable).     If you need a refill on your cardiac medications before your next appointment, please call your pharmacy.  Two Gram Sodium Diet 2000 mg  What is Sodium? Sodium is a mineral found naturally in many foods. The most significant source of sodium in the diet is table salt, which is about 40% sodium.  Processed, convenience, and preserved foods also contain a large amount of sodium.  The body needs only 500 mg of sodium daily to function,  A normal diet provides more than enough sodium even if you do not use salt.  Why Limit Sodium? A build up of sodium in the body can cause thirst, increased blood pressure, shortness of breath, and water retention.  Decreasing sodium in the diet can reduce edema and risk of heart attack  or stroke associated with high blood pressure.  Keep in mind that there are many other factors involved in these health problems.  Heredity, obesity, lack of exercise, cigarette smoking, stress and what you eat all play a role.  General Guidelines:  Do not add salt at the table or in cooking.  One teaspoon of salt contains over 2 grams of sodium.  Read food labels  Avoid processed and convenience foods  Ask your dietitian before  eating any foods not dicussed in the menu planning guidelines  Consult your physician if you wish to use a salt substitute or a sodium containing medication such as antacids.  Limit milk and milk products to 16 oz (2 cups) per day.  Shopping Hints:  READ LABELS!! "Dietetic" does not necessarily mean low sodium.  Salt and other sodium ingredients are often added to foods during processing.   Menu Planning Guidelines Food Group Choose More Often Avoid  Beverages (see also the milk group All fruit juices, low-sodium, salt-free vegetables juices, low-sodium carbonated beverages Regular vegetable or tomato juices, commercially softened water used for drinking or cooking  Breads and Cereals Enriched white, wheat, rye and pumpernickel bread, hard rolls and dinner rolls; muffins, cornbread and waffles; most dry cereals, cooked cereal without added salt; unsalted crackers and breadsticks; low sodium or homemade bread crumbs Bread, rolls and crackers with salted tops; quick breads; instant hot cereals; pancakes; commercial bread stuffing; self-rising flower and biscuit mixes; regular bread crumbs or cracker crumbs  Desserts and Sweets Desserts and sweets mad with mild should be within allowance Instant pudding mixes and cake mixes  Fats Butter or margarine; vegetable oils; unsalted salad dressings, regular salad dressings limited to 1 Tbs; light, sour and heavy cream Regular salad dressings containing bacon fat, bacon bits, and salt pork; snack dips made with instant soup mixes or processed cheese; salted nuts  Fruits Most fresh, frozen and canned fruits Fruits processed with salt or sodium-containing ingredient (some dried fruits are processed with sodium sulfites        Vegetables Fresh, frozen vegetables and low- sodium canned vegetables Regular canned vegetables, sauerkraut, pickled vegetables, and others prepared in brine; frozen vegetables in sauces; vegetables seasoned with ham, bacon or salt  pork  Condiments, Sauces, Miscellaneous  Salt substitute with physician's approval; pepper, herbs, spices; vinegar, lemon or lime juice; hot pepper sauce; garlic powder, onion powder, low sodium soy sauce (1 Tbs.); low sodium condiments (ketchup, chili sauce, mustard) in limited amounts (1 tsp.) fresh ground horseradish; unsalted tortilla chips, pretzels, potato chips, popcorn, salsa (1/4 cup) Any seasoning made with salt including garlic salt, celery salt, onion salt, and seasoned salt; sea salt, rock salt, kosher salt; meat tenderizers; monosodium glutamate; mustard, regular soy sauce, barbecue, sauce, chili sauce, teriyaki sauce, steak sauce, Worcestershire sauce, and most flavored vinegars; canned gravy and mixes; regular condiments; salted snack foods, olives, picles, relish, horseradish sauce, catsup   Food preparation: Try these seasonings Meats:    Pork Sage, onion Serve with applesauce  Chicken Poultry seasoning, thyme, parsley Serve with cranberry sauce  Lamb Curry powder, rosemary, garlic, thyme Serve with mint sauce or jelly  Veal Marjoram, basil Serve with current jelly, cranberry sauce  Beef Pepper, bay leaf Serve with dry mustard, unsalted chive butter  Fish [place] leaf, dill Serve with unsalted lemon butter, unsalted parsley butter  Vegetables:    Asparagus Lemon juice   Broccoli Lemon juice   Carrots Mustard dressing parsley, mint, nutmeg, glazed with unsalted butter and sugar  Green beans Marjoram, lemon juice, nutmeg,dill seed   Tomatoes Basil, marjoram, onion   Spice /blend for [employer]" 4 tsp ground thyme 1 tsp ground sage 3 tsp ground rosemary 4 tsp ground marjoram   Test your knowledge 1. A product that says "Salt Free" may still contain sodium. True or False 2. Garlic Powder and Hot Pepper Sauce an be used as alternative seasonings.True or False 3. Processed foods have more sodium than fresh foods.  True or False 4. Canned Vegetables have less sodium than froze  True or False  WAYS TO DECREASE YOUR SODIUM INTAKE 1. Avoid the use of added salt in cooking and at the table.  Table salt (and other prepared seasonings which contain salt) is probably one of the greatest sources of sodium in the diet.  Unsalted foods can gain flavor from the sweet, sour, and butter taste sensations of herbs and spices.  Instead of using salt for seasoning, try the following seasonings with the foods listed.  Remember: how you use them to enhance natural food flavors is limited only by your creativity... Allspice-Meat, fish, eggs, fruit, peas, red and yellow vegetables Almond Extract-Fruit baked goods Anise Seed-Sweet breads, fruit, carrots, beets, cottage cheese, cookies (tastes like licorice) Basil-Meat, fish, eggs, vegetables, rice, vegetables salads, soups, sauces Bay Leaf-Meat, fish, stews, poultry Burnet-Salad, vegetables (cucumber-like flavor) Caraway Seed-Bread, cookies, cottage cheese, meat, vegetables, cheese, rice Cardamon-Baked goods, fruit, soups Celery Powder or seed-Salads, salad dressings, sauces, meatloaf, soup, bread.Do not use  celery salt Chervil-Meats, salads, fish, eggs, vegetables, cottage cheese (parsley-like flavor) Chili Power-Meatloaf, chicken cheese, corn, eggplant, egg dishes Chives-Salads cottage cheese, egg dishes, soups, vegetables, sauces Cilantro-Salsa, casseroles Cinnamon-Baked goods, fruit, pork, lamb, chicken, carrots Cloves-Fruit, baked goods, fish, pot roast, green beans, beets, carrots Coriander-Pastry, cookies, meat, salads, cheese (lemon-orange flavor) Cumin-Meatloaf, fish,cheese, eggs, cabbage,fruit pie (caraway flavor) [employer], fruit, eggs, fish, poultry, cottage cheese, vegetables Dill Seed-Meat, cottage cheese, poultry, vegetables, fish, salads, bread Fennel Seed-Bread, cookies, apples, pork, eggs, fish, beets, cabbage, cheese, Licorice-like flavor Garlic-(buds or powder) Salads, meat, poultry, fish, bread, butter,  vegetables, potatoes.Do not  use garlic salt Ginger-Fruit, vegetables, baked goods, meat, fish, poultry Horseradish Root-Meet, vegetables, butter Lemon Juice or Extract-Vegetables, fruit, tea, baked goods, fish salads Mace-Baked goods fruit, vegetables, fish, poultry (taste like nutmeg) Maple Extract-Syrups Marjoram-Meat, chicken, fish, vegetables, breads, green salads (taste like Sage) Mint-Tea, lamb, sherbet, vegetables, desserts, carrots, cabbage Mustard, Dry or Seed-Cheese, eggs, meats, vegetables, poultry Nutmeg-Baked goods, fruit, chicken, eggs, vegetables, desserts Onion Powder-Meat, fish, poultry, vegetables, cheese, eggs, bread, rice salads (Do not use   Onion salt) Orange Extract-Desserts, baked goods Oregano-Pasta, eggs, cheese, onions, pork, lamb, fish, chicken, vegetables, green salads Paprika-Meat, fish, poultry, eggs, cheese, vegetables Parsley Flakes-Butter, vegetables, meat fish, poultry, eggs, bread, salads (certain forms may   Contain sodium Pepper-Meat fish, poultry, vegetables, eggs Peppermint Extract-Desserts, baked goods Poppy Seed-Eggs, bread, cheese, fruit dressings, baked goods, noodles, vegetables, cottage  [employer], poultry, meat, fish, cauliflower, turnips,eggs bread Saffron-Rice, bread, veal, chicken, fish, eggs Sage-Meat, fish, poultry, onions, eggplant, tomateos, pork, stews Savory-Eggs, salads, poultry, meat, rice, vegetables, soups, pork Tarragon-Meat, poultry, fish, eggs, butter, vegetables (licorice-like flavor)  Thyme-Meat, poultry, fish, eggs, vegetables, (clover-like flavor), sauces, soups Tumeric-Salads, butter, eggs, fish, rice, vegetables (saffron-like flavor) Vanilla Extract-Baked goods, candy Vinegar-Salads, vegetables, meat marinades Walnut Extract-baked goods, candy  2. Choose your Foods Wisely   The following is a list of foods to avoid which are high in sodium:  Meats-Avoid all smoked,  canned,  salt cured, dried and kosher meat and fish as well as Anchovies   [employer] meats:Bologna, Liverwurst, Pastrami Canned meat or fish  Marinated herring Caviar    Pepperoni Corned Beef   Pizza Dried chipped beef  Salami Frozen breaded fish or meat Salt pork Frankfurters or hot dogs  Sardines Gefilte fish   Sausage Ham (boiled ham, Proscuitto Smoked butt    spiced ham)   Spam      TV Dinners Vegetables Canned vegetables (Regular) Relish Canned mushrooms  Sauerkraut Olives    Tomato juice Pickles  Bakery and Dessert Products Canned puddings  Cream pies Cheesecake   Decorated cakes Cookies  Beverages/Juices Tomato juice, regular  Gatorade   V-8 vegetable juice, regular  Breads and Cereals Biscuit mixes   Salted potato chips, corn chips, pretzels Bread stuffing mixes  Salted crackers and rolls Pancake and waffle mixes Self-rising flour  Seasonings Accent    Meat sauces Barbecue sauce  Meat tenderizer Catsup    Monosodium glutamate (MSG) Celery salt   Onion salt Chili sauce   Prepared mustard Garlic salt   Salt, seasoned salt, sea salt Gravy mixes   Soy sauce Horseradish   Steak sauce Ketchup   Tartar sauce Lite salt    Teriyaki sauce Marinade mixes   Worcestershire sauce  Others Baking powder   Cocoa and cocoa mixes Baking soda   Commercial casserole mixes Candy-caramels, chocolate  Dehydrated soups    Bars, fudge,nougats  Instant rice and pasta mixes Canned broth or soup  Maraschino cherries Cheese, aged and processed cheese and cheese spreads  Learning Assessment Quiz  Indicated T (for True) or F (for False) for each of the following statements:  1. _____ Fresh fruits and vegetables and unprocessed grains are generally low in sodium 2. _____ Water may contain a considerable amount of sodium, depending on the source 3. _____ You can always tell if a food is high in sodium by tasting it 4. _____ Certain laxatives my be high in sodium and  should be avoided unless prescribed   by a physician or pharmacist 5. _____ Salt substitutes may be used freely by anyone on a sodium restricted diet 6. _____ Sodium is present in table salt, food additives and as a natural component of   most foods 7. _____ Table salt is approximately 90% sodium 8. _____ Limiting sodium intake may help prevent excess fluid accumulation in the body 9. _____ On a sodium-restricted diet, seasonings such as bouillon soy sauce, and    cooking wine should be used in place of table salt 10. _____ On an ingredient list, a product which lists monosodium glutamate as the first   ingredient is an appropriate food to include on a low sodium diet  Circle the best answer(s) to the following statements (Hint: there may be more than one correct answer)  11. On a low-sodium diet, some acceptable snack items are:    A. Olives  F. Bean dip   K. Grapefruit juice    B. Salted Pretzels G. Commercial Popcorn   L. Canned peaches    C. Carrot Sticks  H. Bouillon   M. Unsalted nuts   D. [place] fries  I. Peanut butter crackers N. Salami   E. Sweet pickles J. Tomato Juice   O. Pizza  12.  Seasonings that may be used freely on a reduced - sodium diet include   A. Lemon wedges F.Monosodium glutamate K. Celery seed    B.Soysauce  G. Pepper   L. Mustard powder   C. Sea salt  H. Cooking wine  M. Onion flakes   D. Vinegar  E. Prepared horseradish N. Salsa   E. Sage   J. Worcestershire sauce  O. [address]     Sumner Boast, PA-C  08/16/2017 11:38 AM    [facility] [place], [place], [place]  [address] Phone: [phone]; Fax: [phone]

## 2017-08-16 ENCOUNTER — Encounter: Payer: Self-pay | Admitting: Physician Assistant

## 2017-08-16 ENCOUNTER — Ambulatory Visit: Payer: Medicare Other | Admitting: Physician Assistant

## 2017-08-16 VITALS — BP 148/90 | HR 98 | Ht 65.0 in | Wt 177.8 lb

## 2017-08-16 DIAGNOSIS — I10 Essential (primary) hypertension: Secondary | ICD-10-CM | POA: Diagnosis not present

## 2017-08-16 DIAGNOSIS — I4892 Unspecified atrial flutter: Secondary | ICD-10-CM

## 2017-08-16 DIAGNOSIS — E781 Pure hyperglyceridemia: Secondary | ICD-10-CM | POA: Diagnosis not present

## 2017-08-16 DIAGNOSIS — E118 Type 2 diabetes mellitus with unspecified complications: Secondary | ICD-10-CM

## 2017-08-16 DIAGNOSIS — R600 Localized edema: Secondary | ICD-10-CM

## 2017-08-16 DIAGNOSIS — I2510 Atherosclerotic heart disease of native coronary artery without angina pectoris: Secondary | ICD-10-CM

## 2017-08-16 MED ORDER — DILTIAZEM HCL ER COATED BEADS 360 MG PO CP24: 360.0000 mg | ORAL_CAPSULE | Freq: Every day | ORAL | 3 refills | Status: DC

## 2017-08-16 NOTE — Patient Instructions (Addendum)
Medication Instructions:  1. INCREASE DILTIAZEM TO 360 MG DAILY.   Labwork: NONE ORDERED TODAY  Testing/Procedures: NONE ORDERED TODAY  Follow-Up: Your physician recommends that you schedule a follow-up appointment with Ermalinda Barrios, NP on August 28th at 11:30 a.m.  Any Other Special Instructions Will Be Listed Below (If Applicable).     If you need a refill on your cardiac medications before your next appointment, please call your pharmacy.  Two Gram Sodium Diet 2000 mg  What is Sodium? Sodium is a mineral found naturally in many foods. The most significant source of sodium in the diet is table salt, which is about 40% sodium.  Processed, convenience, and preserved foods also contain a large amount of sodium.  The body needs only 500 mg of sodium daily to function,  A normal diet provides more than enough sodium even if you do not use salt.  Why Limit Sodium? A build up of sodium in the body can cause thirst, increased blood pressure, shortness of breath, and water retention.  Decreasing sodium in the diet can reduce edema and risk of heart attack or stroke associated with high blood pressure.  Keep in mind that there are many other factors involved in these health problems.  Heredity, obesity, lack of exercise, cigarette smoking, stress and what you eat all play a role.  General Guidelines:  Do not add salt at the table or in cooking.  One teaspoon of salt contains over 2 grams of sodium.  Read food labels  Avoid processed and convenience foods  Ask your dietitian before eating any foods not dicussed in the menu planning guidelines  Consult your physician if you wish to use a salt substitute or a sodium containing medication such as antacids.  Limit milk and milk products to 16 oz (2 cups) per day.  Shopping Hints:  READ LABELS!! "Dietetic" does not necessarily mean low sodium.  Salt and other sodium ingredients are often added to foods during processing.   Menu  Planning Guidelines Food Group Choose More Often Avoid  Beverages (see also the milk group All fruit juices, low-sodium, salt-free vegetables juices, low-sodium carbonated beverages Regular vegetable or tomato juices, commercially softened water used for drinking or cooking  Breads and Cereals Enriched white, wheat, rye and pumpernickel bread, hard rolls and dinner rolls; muffins, cornbread and waffles; most dry cereals, cooked cereal without added salt; unsalted crackers and breadsticks; low sodium or homemade bread crumbs Bread, rolls and crackers with salted tops; quick breads; instant hot cereals; pancakes; commercial bread stuffing; self-rising flower and biscuit mixes; regular bread crumbs or cracker crumbs  Desserts and Sweets Desserts and sweets mad with mild should be within allowance Instant pudding mixes and cake mixes  Fats Butter or margarine; vegetable oils; unsalted salad dressings, regular salad dressings limited to 1 Tbs; light, sour and heavy cream Regular salad dressings containing bacon fat, bacon bits, and salt pork; snack dips made with instant soup mixes or processed cheese; salted nuts  Fruits Most fresh, frozen and canned fruits Fruits processed with salt or sodium-containing ingredient (some dried fruits are processed with sodium sulfites        Vegetables Fresh, frozen vegetables and low- sodium canned vegetables Regular canned vegetables, sauerkraut, pickled vegetables, and others prepared in brine; frozen vegetables in sauces; vegetables seasoned with ham, bacon or salt pork  Condiments, Sauces, Miscellaneous  Salt substitute with physician's approval; pepper, herbs, spices; vinegar, lemon or lime juice; hot pepper sauce; garlic powder, onion powder, low sodium soy  sauce (1 Tbs.); low sodium condiments (ketchup, chili sauce, mustard) in limited amounts (1 tsp.) fresh ground horseradish; unsalted tortilla chips, pretzels, potato chips, popcorn, salsa (1/4 cup) Any seasoning  made with salt including garlic salt, celery salt, onion salt, and seasoned salt; sea salt, rock salt, kosher salt; meat tenderizers; monosodium glutamate; mustard, regular soy sauce, barbecue, sauce, chili sauce, teriyaki sauce, steak sauce, Worcestershire sauce, and most flavored vinegars; canned gravy and mixes; regular condiments; salted snack foods, olives, picles, relish, horseradish sauce, catsup   Food preparation: Try these seasonings Meats:    Pork Sage, onion Serve with applesauce  Chicken Poultry seasoning, thyme, parsley Serve with cranberry sauce  Lamb Curry powder, rosemary, garlic, thyme Serve with mint sauce or jelly  Veal Marjoram, basil Serve with current jelly, cranberry sauce  Beef Pepper, bay leaf Serve with dry mustard, unsalted chive butter  Fish [place] leaf, dill Serve with unsalted lemon butter, unsalted parsley butter  Vegetables:    Asparagus Lemon juice   Broccoli Lemon juice   Carrots Mustard dressing parsley, mint, nutmeg, glazed with unsalted butter and sugar   Green beans Marjoram, lemon juice, nutmeg,dill seed   Tomatoes Basil, marjoram, onion   Spice /blend for [employer]" 4 tsp ground thyme 1 tsp ground sage 3 tsp ground rosemary 4 tsp ground marjoram   Test your knowledge 1. A product that says "Salt Free" may still contain sodium. True or False 2. Garlic Powder and Hot Pepper Sauce an be used as alternative seasonings.True or False 3. Processed foods have more sodium than fresh foods.  True or False 4. Canned Vegetables have less sodium than froze True or False  WAYS TO DECREASE YOUR SODIUM INTAKE 1. Avoid the use of added salt in cooking and at the table.  Table salt (and other prepared seasonings which contain salt) is probably one of the greatest sources of sodium in the diet.  Unsalted foods can gain flavor from the sweet, sour, and butter taste sensations of herbs and spices.  Instead of using salt for seasoning, try the following seasonings with  the foods listed.  Remember: how you use them to enhance natural food flavors is limited only by your creativity... Allspice-Meat, fish, eggs, fruit, peas, red and yellow vegetables Almond Extract-Fruit baked goods Anise Seed-Sweet breads, fruit, carrots, beets, cottage cheese, cookies (tastes like licorice) Basil-Meat, fish, eggs, vegetables, rice, vegetables salads, soups, sauces Bay Leaf-Meat, fish, stews, poultry Burnet-Salad, vegetables (cucumber-like flavor) Caraway Seed-Bread, cookies, cottage cheese, meat, vegetables, cheese, rice Cardamon-Baked goods, fruit, soups Celery Powder or seed-Salads, salad dressings, sauces, meatloaf, soup, bread.Do not use  celery salt Chervil-Meats, salads, fish, eggs, vegetables, cottage cheese (parsley-like flavor) Chili Power-Meatloaf, chicken cheese, corn, eggplant, egg dishes Chives-Salads cottage cheese, egg dishes, soups, vegetables, sauces Cilantro-Salsa, casseroles Cinnamon-Baked goods, fruit, pork, lamb, chicken, carrots Cloves-Fruit, baked goods, fish, pot roast, green beans, beets, carrots Coriander-Pastry, cookies, meat, salads, cheese (lemon-orange flavor) Cumin-Meatloaf, fish,cheese, eggs, cabbage,fruit pie (caraway flavor) [employer], fruit, eggs, fish, poultry, cottage cheese, vegetables Dill Seed-Meat, cottage cheese, poultry, vegetables, fish, salads, bread Fennel Seed-Bread, cookies, apples, pork, eggs, fish, beets, cabbage, cheese, Licorice-like flavor Garlic-(buds or powder) Salads, meat, poultry, fish, bread, butter, vegetables, potatoes.Do not  use garlic salt Ginger-Fruit, vegetables, baked goods, meat, fish, poultry Horseradish Root-Meet, vegetables, butter Lemon Juice or Extract-Vegetables, fruit, tea, baked goods, fish salads Mace-Baked goods fruit, vegetables, fish, poultry (taste like nutmeg) Maple Extract-Syrups Marjoram-Meat, chicken, fish, vegetables, breads, green salads (taste like Sage) Mint-Tea, lamb,  sherbet, vegetables,  desserts, carrots, cabbage Mustard, Dry or Seed-Cheese, eggs, meats, vegetables, poultry Nutmeg-Baked goods, fruit, chicken, eggs, vegetables, desserts Onion Powder-Meat, fish, poultry, vegetables, cheese, eggs, bread, rice salads (Do not use   Onion salt) Orange Extract-Desserts, baked goods Oregano-Pasta, eggs, cheese, onions, pork, lamb, fish, chicken, vegetables, green salads Paprika-Meat, fish, poultry, eggs, cheese, vegetables Parsley Flakes-Butter, vegetables, meat fish, poultry, eggs, bread, salads (certain forms may   Contain sodium Pepper-Meat fish, poultry, vegetables, eggs Peppermint Extract-Desserts, baked goods Poppy Seed-Eggs, bread, cheese, fruit dressings, baked goods, noodles, vegetables, cottage  [employer], poultry, meat, fish, cauliflower, turnips,eggs bread Saffron-Rice, bread, veal, chicken, fish, eggs Sage-Meat, fish, poultry, onions, eggplant, tomateos, pork, stews Savory-Eggs, salads, poultry, meat, rice, vegetables, soups, pork Tarragon-Meat, poultry, fish, eggs, butter, vegetables (licorice-like flavor)  Thyme-Meat, poultry, fish, eggs, vegetables, (clover-like flavor), sauces, soups Tumeric-Salads, butter, eggs, fish, rice, vegetables (saffron-like flavor) Vanilla Extract-Baked goods, candy Vinegar-Salads, vegetables, meat marinades Walnut Extract-baked goods, candy  2. Choose your Foods Wisely   The following is a list of foods to avoid which are high in sodium:  Meats-Avoid all smoked, canned, salt cured, dried and kosher meat and fish as well as Anchovies   [employer] meats:Bologna, Liverwurst, Pastrami Canned meat or fish  Marinated herring Caviar    Pepperoni Corned Beef   Pizza Dried chipped beef  Salami Frozen breaded fish or meat Salt pork Frankfurters or hot dogs  Sardines Gefilte fish   Sausage Ham (boiled ham, Proscuitto Smoked butt    spiced ham)   Spam      TV  Dinners Vegetables Canned vegetables (Regular) Relish Canned mushrooms  Sauerkraut Olives    Tomato juice Pickles  Bakery and Dessert Products Canned puddings  Cream pies Cheesecake   Decorated cakes Cookies  Beverages/Juices Tomato juice, regular  Gatorade   V-8 vegetable juice, regular  Breads and Cereals Biscuit mixes   Salted potato chips, corn chips, pretzels Bread stuffing mixes  Salted crackers and rolls Pancake and waffle mixes Self-rising flour  Seasonings Accent    Meat sauces Barbecue sauce  Meat tenderizer Catsup    Monosodium glutamate (MSG) Celery salt   Onion salt Chili sauce   Prepared mustard Garlic salt   Salt, seasoned salt, sea salt Gravy mixes   Soy sauce Horseradish   Steak sauce Ketchup   Tartar sauce Lite salt    Teriyaki sauce Marinade mixes   Worcestershire sauce  Others Baking powder   Cocoa and cocoa mixes Baking soda   Commercial casserole mixes Candy-caramels, chocolate  Dehydrated soups    Bars, fudge,nougats  Instant rice and pasta mixes Canned broth or soup  Maraschino cherries Cheese, aged and processed cheese and cheese spreads  Learning Assessment Quiz  Indicated T (for True) or F (for False) for each of the following statements:  1. _____ Fresh fruits and vegetables and unprocessed grains are generally low in sodium 2. _____ Water may contain a considerable amount of sodium, depending on the source 3. _____ You can always tell if a food is high in sodium by tasting it 4. _____ Certain laxatives my be high in sodium and should be avoided unless prescribed   by a physician or pharmacist 5. _____ Salt substitutes may be used freely by anyone on a sodium restricted diet 6. _____ Sodium is present in table salt, food additives and as a natural component of   most foods 7. _____ Table salt is approximately 90% sodium 8. _____ Limiting sodium intake  may help prevent excess fluid accumulation in the body 9. _____ On a  sodium-restricted diet, seasonings such as bouillon soy sauce, and    cooking wine should be used in place of table salt 10. _____ On an ingredient list, a product which lists monosodium glutamate as the first   ingredient is an appropriate food to include on a low sodium diet  Circle the best answer(s) to the following statements (Hint: there may be more than one correct answer)  11. On a low-sodium diet, some acceptable snack items are:    A. Olives  F. Bean dip   K. Grapefruit juice    B. Salted Pretzels G. Commercial Popcorn   L. Canned peaches    C. Carrot Sticks  H. Bouillon   M. Unsalted nuts   D. [place] fries  I. Peanut butter crackers N. Salami   E. Sweet pickles J. Tomato Juice   O. Pizza  12.  Seasonings that may be used freely on a reduced - sodium diet include   A. Lemon wedges F.Monosodium glutamate K. Celery seed    B.Soysauce   G. Pepper   L. Mustard powder   C. Sea salt  H. Cooking wine  M. Onion flakes   D. Vinegar  E. Prepared horseradish N. Salsa   E. Sage   J. Worcestershire sauce  O. [place]ut[place]

## 2017-08-30 ENCOUNTER — Other Ambulatory Visit: Payer: Self-pay

## 2017-09-04 ENCOUNTER — Other Ambulatory Visit: Payer: Self-pay

## 2017-09-04 ENCOUNTER — Telehealth: Payer: Self-pay | Admitting: Emergency Medicine

## 2017-09-04 NOTE — Telephone Encounter (Signed)
error 

## 2017-09-19 NOTE — Progress Notes (Signed)
Cardiology Office Note    Date:  09/20/2017   ID:  Alejandro, Adcox [birth year], MRN [id number]  PCP:  [name], MD  Cardiologist: Larae Grooms, MD  Chief Complaint  Patient presents with  . Follow-up    History of Present Illness:  Nicole [name] is a 77 y.o. female with history of chronic atrial fibrillation on Eliquis, CAD with prior cath in 2015 demonstrating 50% RCA and circumflex and 80% first diagonal.  Patient was having worsening shortness of breath and nuclear study ordered by PCP LVEF 66% normal perfusion however intermediate risk secondary to elevated 3 times daily ratio of 1.44.  She also has history of lung cancer status post radiation and was felt she might have radiation pneumonitis and was treated with steroids which seemed to help her cough.  Cardiac catheterization 02/24/2017 showed nonobstructive CAD with 25% proximal RCA, 30% proximal circumflex, 25% mid circumflex, 30% ostial third marginal, 25% proximal and mid LAD, normal LVEF 55 to 65%, no aortic valve stenosis.  Aggressive medical therapy recommended   I saw the patient 06/06/2017 at which time she was having rectal bleeding on Eliquis felt secondary to hemorrhoids.  She had stopped her Eliquis and the rectal bleeding resolved.  Stool cards were negative and hemoglobin was stable.  Colonoscopy found diverticulosis as well as hemorrhoids which seem to be the cause of the rectal bleeding.  If recurrent rectal bleeding he recommended surgical removal of the hemorrhoids   I saw the patient in June and July complaining of increased stress taking care of her husband.  She was tachycardic in the office just despite taking her diltiazem.  48-hour monitor showed normal sinus rhythm with occasional PACs and PVCs, no significant bradycardia average heart rate 96 bpm.  On follow-up in July she was still under a lot of stress taking care of her husband as well over 2 grandchildren one who is autistic.  Heart rate was  still 98 blood pressure was up.  I increased her diltiazem to 360 mg daily.  Had some lower extremity edema and I discussed possible diuretic pressure remained elevated.  Patient comes in today for follow-up.  She just came from a 2-hour acupuncture session because she could not walk.  They worked on her knee and she feels so much better.  Her blood pressure and pulse have been much better.  Things seem to have come down a little bit at her house as her husband is off his antibiotics for sepsis.  She is going to a wedding in [place] on Friday.  Past Medical History:  Diagnosis Date  . A-fib ([place])   . Adenocarcinoma of right lung, stage 3 ([place]) 07/28/2016  . Atrial flutter ([place]) 10/05/2016  . Bronchitis    hx of  . 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 Cancer-related pain 10/06/2016  . Coronary artery disease 02/21/2017   JAN 2019 Prox RCA lesion is 25% stenosed. Prox Cx lesion is 30% stenosed. Mid Cx lesion is 25% stenosed. Ost 3rd Mrg lesion is 30% stenosed. Prox LAD lesion is 25% stenosed. Mid LAD lesion is 25% stenosed. The left ventricular systolic function is normal. LV end diastolic pressure is normal. The left ventricular ejection fraction is 55-65% by visual estimate. There is no aortic valve stenosis.   N  . Diabetes mellitus    type 2  . Edema 07/12/2017  . Essential hypertension 01/20/2009  . Gallstones   . Headache   . History  of radiation therapy 08/09/2016 to 09/19/2016   Right lung was treated to 60 Gy in 30 fractions at 2 Gy per fraction  . Hypertension   . Hypertriglyceridemia 08/22/2012  . Low back pain   . Lung mass    with lymphadenopathy  . Osteoarthritis   . Type II diabetes mellitus with manifestations ([place]) 01/20/2009   Estimated Creatinine Clearance: 49.8 mL/min (by C-G formula based on SCr of 1 mg/dL).    Past Surgical History:  Procedure Laterality Date  . ABDOMINAL HYSTERECTOMY  2004  . APPENDECTOMY  when 77 years old  . COLONOSCOPY  08-05-09    [name]  . INCONTINENCE SURGERY    . KNEE ARTHROSCOPY Left   . LEFT HEART CATH AND CORONARY ANGIOGRAPHY N/A 02/24/2017   Procedure: LEFT HEART CATH AND CORONARY ANGIOGRAPHY;  Surgeon: Jettie Booze, MD;  Location: [place] CV LAB;  Service: Cardiovascular;  Laterality: N/A;  . LEFT HEART CATHETERIZATION WITH CORONARY ANGIOGRAM N/A 07/19/2013   Procedure: LEFT HEART CATHETERIZATION WITH CORONARY ANGIOGRAM;  Surgeon: Sinclair Grooms, MD;  Location: [facility] CATH LAB;  Service: Cardiovascular;  Laterality: N/A;  . POLYPECTOMY  08-05-09   2 polyps  . TOTAL KNEE ARTHROPLASTY Left 03/28/2012   Procedure: LEFT TOTAL KNEE ARTHROPLASTY;  Surgeon: Tobi Bastos, MD;  Location: [facility];  Service: Orthopedics;  Laterality: Left;  . TUBAL LIGATION    . VIDEO BRONCHOSCOPY WITH ENDOBRONCHIAL ULTRASOUND N/A 07/11/2016   Procedure: VIDEO BRONCHOSCOPY WITH ENDOBRONCHIAL ULTRASOUND;  Surgeon: Marshell Garfinkel, MD;  Location: [place];  Service: Pulmonary;  Laterality: N/A;    Current Medications: Current Meds  Medication Sig  . apixaban (ELIQUIS) 5 MG TABS tablet Take 5 mg by mouth 2 (two) times daily.  . COLLAGEN PO Take 1 tablet by mouth daily.   [name] diltiazem (CARDIZEM CD) 360 MG 24 hr capsule Take 1 capsule (360 mg total) by mouth daily.  [name] glucose blood (ACCU-CHEK GUIDE) test strip 1 each by Other route daily. Use to check blood sugars daily DX code: E11.8  . irbesartan (AVAPRO) 300 MG tablet TAKE 1 TABLET BY MOUTH ONCE DAILY  . Lancets (ACCU-CHEK SOFT TOUCH) lancets Use to check blood sugars daily Dx E11.9  . metFORMIN (GLUCOPHAGE-XR) 500 MG 24 hr tablet Take 2 tablets (1,000 mg total) by mouth daily with breakfast.     Allergies:   No known allergies   Social History   Socioeconomic History  . Marital status: Married    Spouse name: Not on file  . Number of children: Not on file  . Years of education: Not on file  . Highest education level: Not on file  Occupational History  . Occupation:  retired    Fish farm manager: RETIRED  Social Needs  . Financial resource strain: Not on file  . Food insecurity:    Worry: Not on file    Inability: Not on file  . Transportation needs:    Medical: Not on file    Non-medical: Not on file  Tobacco Use  . Smoking status: Never Smoker  . Smokeless tobacco: Never Used  Substance and Sexual Activity  . Alcohol use: No    Alcohol/week: 0.0 standard drinks  . Drug use: No  . Sexual activity: Not Currently  Lifestyle  . Physical activity:    Days per week: Not on file    Minutes per session: Not on file  . Stress: Not on file  Relationships  . Social connections:    Talks on  phone: Not on file    Gets together: Not on file    Attends religious service: Not on file    Active member of club or organization: Not on file    Attends meetings of clubs or organizations: Not on file    Relationship status: Not on file  Other Topics Concern  . Not on file  Social History Narrative   Regular exercise- Yes     Family History:  The patient's family history includes Arthritis in her other; Cancer in her other; Diabetes in her other; Heart attack in her father; Heart disease in her father; Hyperlipidemia in her other; Hypertension in her other.   ROS:   Please see the history of present illness.    Review of Systems  Constitution: Negative.  HENT: Negative.   Eyes: Negative.   Cardiovascular: Positive for leg swelling.  Respiratory: Negative.   Hematologic/Lymphatic: Negative.   Musculoskeletal: Positive for joint swelling and stiffness. Negative for joint pain.  Gastrointestinal: Negative.   Genitourinary: Negative.   Neurological: Negative.    All other systems reviewed and are negative.   PHYSICAL EXAM:   VS:  BP 130/64   Pulse 87   Ht 5\' 5"  (1.651 m)   Wt 176 lb 1.9 oz (79.9 kg)   SpO2 96%   BMI 29.31 kg/m   Physical Exam  GEN: Well nourished, well developed, in no acute distress  Neck: no JVD, carotid bruits, or  masses Cardiac:RRR; no murmurs, rubs, or gallops  Respiratory:  clear to auscultation bilaterally, normal work of breathing GI: soft, nontender, nondistended, + BS Ext: Bilateral lower extremity edema, chronic was compression stockings neuro:  Alert and Oriented x 3 Psych: euthymic mood, full affect  Wt Readings from Last 3 Encounters:  09/20/17 176 lb 1.9 oz (79.9 kg)  08/16/17 177 lb 12.8 oz (80.6 kg)  08/03/17 179 lb (81.2 kg)      Studies/Labs Reviewed:   EKG:  EKG is  ordered today.  The ekg ordered today demonstrates normal sinus rhythm at 87 bpm  Recent Labs: 09/23/2016: Magnesium 1.9 02/02/2017: Pro B Natriuretic peptide (BNP) 33.0; TSH 0.16 05/22/2017: ALT 11; BUN 22; Creatinine 0.91; Hemoglobin 14.3; Platelet Count 181; Potassium 4.0; Sodium 140   Lipid Panel    Component Value Date/Time   CHOL 203 (H) 02/02/2017 1709   TRIG 147.0 02/02/2017 1709   HDL 61.10 02/02/2017 1709   CHOLHDL 3 02/02/2017 1709   VLDL 29.4 02/02/2017 1709   LDLCALC 113 (H) 02/02/2017 1709   LDLDIRECT 74.0 06/14/2016 1356    Additional studies/ records that were reviewed today include:   Holter monitor 07/20/2017 Study Highlights     NSR with occasional PACs and PVCs  No significant bradycardia.  Average HR 96 bpm.  No pathologic arrhtyhmias.        cardiac catheterization 2/1/2019Conclusion       Prox RCA lesion is 25% stenosed.  Prox Cx lesion is 30% stenosed.  Mid Cx lesion is 25% stenosed.  Ost 3rd Mrg lesion is 30% stenosed.  Prox LAD lesion is 25% stenosed.  Mid LAD lesion is 25% stenosed.  The left ventricular systolic function is normal.  LV end diastolic pressure is normal.  The left ventricular ejection fraction is 55-65% by visual estimate.  There is no aortic valve stenosis.   Nonobstructive CAD.   Continue aggressive medical therapy.       Nuclear stress test 1/17/2019Study Highlights    Addendum by Sueanne Margarita, MD on  Mon Feb 13, 2017  1:18  PM   The left ventricular ejection fraction is hyperdynamic (>65%).  Nuclear stress EF: 66%.  There was no ST segment deviation noted during stress.  The perfusion study is normal.  This is an intermediate study due to transient ischemic dilatation (TID 1.44).      2D echo 9/2018Study Conclusions   - Left ventricle: Abnormal septal motion. There was mild concentric   hypertrophy. Systolic function was normal. The estimated ejection   fraction was in the range of 55% to 60%. Doppler parameters are   consistent with abnormal left ventricular relaxation (grade 1   diastolic dysfunction). - Aortic valve: Nodular calcification of the non coronary cusp.   There was mild regurgitation. - Mitral valve: Calcified annulus. Mildly thickened leaflets . - Atrial septum: A patent foramen ovale cannot be excluded. - Impressions: Abnormal GLS -12.4   Impressions:   - Abnormal GLS -12.4              ASSESSMENT:    1. Atrial flutter, unspecified type ([place])   2. Essential hypertension   3. Coronary artery disease involving native coronary artery of native heart without angina pectoris   4. Type 2 diabetes mellitus with complication, without long-term current use of insulin ([place])   5. Hypertriglyceridemia   6. Lower extremity edema      PLAN:  In order of problems listed above:  Atrial flutter on Eliquis vascular equals 4 recent rectal bleeding resolved but if recurs needs referral to surgeon for hemorrhoidectomy recommended by GI.  Has had sinus tachycardic and diltiazem increased to 360 mg daily.  Doing much better in normal sinus rhythm at 87 bpm.  Essential hypertension blood pressure much better controlled on higher dose diltiazem  CAD nonobstructive on cardiac cath 02/2017 aggressive risk factor modification recommended  Diabetes mellitus on metformin sugars uncontrolled recommend follow-up with PCP  Hypertriglyceridemia 147 01/2017  Chronic lower extremity edema  compression stockings    Medication Adjustments/Labs and Tests Ordered: Current medicines are reviewed at length with the patient today.  Concerns regarding medicines are outlined above.  Medication changes, Labs and Tests ordered today are listed in the Patient Instructions below. Patient Instructions  Medication Instructions:  Your physician recommends that you continue on your current medications as directed. Please refer to the Current Medication list given to you today.   Labwork: None ordered  Testing/Procedures: None ordered  Follow-Up: Your physician recommends that you schedule a follow-up appointment in: 3-4 MONTHS WITH DR. VARANASI  Any Other Special Instructions Will Be Listed Below (If Applicable).     If you need a refill on your cardiac medications before your next appointment, please call your pharmacy.      Sumner Boast, PA-C  09/20/2017 12:05 PM    [facility] [address], [place], [place]  [address] Phone: [phone]; Fax: [phone]

## 2017-09-20 ENCOUNTER — Ambulatory Visit: Payer: Medicare Other | Admitting: Physician Assistant

## 2017-09-20 ENCOUNTER — Encounter: Payer: Self-pay | Admitting: Physician Assistant

## 2017-09-20 VITALS — BP 130/64 | HR 87 | Ht 65.0 in | Wt 176.1 lb

## 2017-09-20 DIAGNOSIS — E118 Type 2 diabetes mellitus with unspecified complications: Secondary | ICD-10-CM

## 2017-09-20 DIAGNOSIS — I4892 Unspecified atrial flutter: Secondary | ICD-10-CM | POA: Diagnosis not present

## 2017-09-20 DIAGNOSIS — I10 Essential (primary) hypertension: Secondary | ICD-10-CM

## 2017-09-20 DIAGNOSIS — I2510 Atherosclerotic heart disease of native coronary artery without angina pectoris: Secondary | ICD-10-CM | POA: Diagnosis not present

## 2017-09-20 DIAGNOSIS — E781 Pure hyperglyceridemia: Secondary | ICD-10-CM

## 2017-09-20 DIAGNOSIS — R600 Localized edema: Secondary | ICD-10-CM

## 2017-09-20 NOTE — Patient Instructions (Addendum)
Medication Instructions:  Your physician recommends that you continue on your current medications as directed. Please refer to the Current Medication list given to you today.   Labwork: None ordered  Testing/Procedures: None ordered  Follow-Up: Your physician recommends that you schedule a follow-up appointment in: 3-4 MONTHS WITH DR. VARANASI  Any Other Special Instructions Will Be Listed Below (If Applicable).     If you need a refill on your cardiac medications before your next appointment, please call your pharmacy.

## 2017-09-27 ENCOUNTER — Telehealth: Payer: Self-pay

## 2017-09-27 MED ORDER — GLUCOSE BLOOD VI STRP: ORAL_STRIP | 3 refills | Status: DC

## 2017-09-27 NOTE — Telephone Encounter (Signed)
Pt brought in latest glucose reading and asked for rx of her BS test strips. Erx has been sent. BS readings given to PCP to review.

## 2017-09-29 ENCOUNTER — Encounter (HOSPITAL_COMMUNITY): Payer: Self-pay

## 2017-09-29 ENCOUNTER — Inpatient Hospital Stay: Payer: Medicare Other | Attending: Internal Medicine

## 2017-09-29 ENCOUNTER — Ambulatory Visit: Payer: Medicare Other | Admitting: Endocrinology

## 2017-09-29 ENCOUNTER — Ambulatory Visit (HOSPITAL_COMMUNITY)
Admission: RE | Admit: 2017-09-29 | Discharge: 2017-09-29 | Disposition: A | Discharge status: Home / Self Care | Payer: Medicare Other | Source: Ambulatory Visit | Attending: Internal Medicine | Admitting: Internal Medicine

## 2017-09-29 DIAGNOSIS — C3431 Malignant neoplasm of lower lobe, right bronchus or lung: Secondary | ICD-10-CM | POA: Diagnosis present

## 2017-09-29 DIAGNOSIS — E119 Type 2 diabetes mellitus without complications: Secondary | ICD-10-CM | POA: Insufficient documentation

## 2017-09-29 DIAGNOSIS — Z9221 Personal history of antineoplastic chemotherapy: Secondary | ICD-10-CM | POA: Diagnosis not present

## 2017-09-29 DIAGNOSIS — I10 Essential (primary) hypertension: Secondary | ICD-10-CM | POA: Diagnosis not present

## 2017-09-29 DIAGNOSIS — Z7984 Long term (current) use of oral hypoglycemic drugs: Secondary | ICD-10-CM | POA: Diagnosis not present

## 2017-09-29 DIAGNOSIS — Z79899 Other long term (current) drug therapy: Secondary | ICD-10-CM | POA: Insufficient documentation

## 2017-09-29 DIAGNOSIS — I2510 Atherosclerotic heart disease of native coronary artery without angina pectoris: Secondary | ICD-10-CM | POA: Insufficient documentation

## 2017-09-29 DIAGNOSIS — Z923 Personal history of irradiation: Secondary | ICD-10-CM | POA: Insufficient documentation

## 2017-09-29 DIAGNOSIS — I4891 Unspecified atrial fibrillation: Secondary | ICD-10-CM | POA: Insufficient documentation

## 2017-09-29 DIAGNOSIS — M1990 Unspecified osteoarthritis, unspecified site: Secondary | ICD-10-CM | POA: Diagnosis not present

## 2017-09-29 DIAGNOSIS — Z7901 Long term (current) use of anticoagulants: Secondary | ICD-10-CM | POA: Insufficient documentation

## 2017-09-29 DIAGNOSIS — I700 Atherosclerosis of aorta: Secondary | ICD-10-CM | POA: Insufficient documentation

## 2017-09-29 DIAGNOSIS — I4892 Unspecified atrial flutter: Secondary | ICD-10-CM | POA: Insufficient documentation

## 2017-09-29 DIAGNOSIS — C3490 Malignant neoplasm of unspecified part of unspecified bronchus or lung: Secondary | ICD-10-CM

## 2017-09-29 LAB — CMP (CANCER CENTER ONLY)
ALT: 13 U/L (ref 0–44)
AST: 14 U/L — ABNORMAL LOW (ref 15–41)
Albumin: 3.7 g/dL (ref 3.5–5.0)
Alkaline Phosphatase: 67 U/L (ref 38–126)
Anion gap: 8 (ref 5–15)
BUN: 23 mg/dL (ref 8–23)
CO2: 27 mmol/L (ref 22–32)
Calcium: 9.8 mg/dL (ref 8.9–10.3)
Chloride: 102 mmol/L (ref 98–111)
Creatinine: 0.99 mg/dL (ref 0.44–1.00)
GFR, Est AFR Am: >60 mL/min (ref >60)
GFR, Estimated: 55 mL/min — ABNORMAL LOW (ref >60)
Glucose, Bld: 138 mg/dL — ABNORMAL HIGH (ref 70–99)
Potassium: 4 mmol/L (ref 3.5–5.1)
Sodium: 137 mmol/L (ref 135–145)
Total Bilirubin: 1 mg/dL (ref 0.3–1.2)
Total Protein: 7.4 g/dL (ref 6.5–8.1)

## 2017-09-29 LAB — CBC WITH DIFFERENTIAL (CANCER CENTER ONLY)
Basophils Absolute: 0 K/uL (ref 0.0–0.1)
Basophils Relative: 0 %
Eosinophils Absolute: 0 K/uL (ref 0.0–0.5)
Eosinophils Relative: 0 %
HCT: 40.2 % (ref 34.8–46.6)
Hemoglobin: 13.5 g/dL (ref 11.6–15.9)
Lymphocytes Relative: 9 %
Lymphs Abs: 0.6 K/uL — ABNORMAL LOW (ref 0.9–3.3)
MCH: 30.5 pg (ref 25.1–34.0)
MCHC: 33.6 g/dL (ref 31.5–36.0)
MCV: 90.7 fL (ref 79.5–101.0)
Monocytes Absolute: 0.5 K/uL (ref 0.1–0.9)
Monocytes Relative: 8 %
Neutro Abs: 5.2 K/uL (ref 1.5–6.5)
Neutrophils Relative %: 83 %
Platelet Count: 235 K/uL (ref 145–400)
RBC: 4.43 MIL/uL (ref 3.70–5.45)
RDW: 13.8 % (ref 11.2–14.5)
WBC Count: 6.2 K/uL (ref 3.9–10.3)

## 2017-09-29 MED ORDER — IOHEXOL 300 MG/ML  SOLN
75.0000 mL | Freq: Once | INTRAMUSCULAR | Status: AC | PRN
  Administered 2017-09-29: 75 mL via INTRAVENOUS

## 2017-09-29 MED ORDER — GLUCOSE BLOOD VI STRP: ORAL_STRIP | 3 refills | Status: DC

## 2017-09-29 NOTE — Addendum Note (Signed)
Addended by: Karren Cobble on: 09/29/2017 09:53 AM   Modules accepted: Orders

## 2017-10-03 ENCOUNTER — Encounter: Payer: Self-pay | Admitting: Internal Medicine

## 2017-10-03 ENCOUNTER — Inpatient Hospital Stay (HOSPITAL_BASED_OUTPATIENT_CLINIC_OR_DEPARTMENT_OTHER): Payer: Medicare Other | Admitting: Internal Medicine

## 2017-10-03 ENCOUNTER — Telehealth: Payer: Self-pay

## 2017-10-03 VITALS — BP 145/73 | HR 81 | Temp 98.7°F | Resp 18 | Ht 65.0 in | Wt 175.9 lb

## 2017-10-03 DIAGNOSIS — Z79899 Other long term (current) drug therapy: Secondary | ICD-10-CM | POA: Diagnosis not present

## 2017-10-03 DIAGNOSIS — E119 Type 2 diabetes mellitus without complications: Secondary | ICD-10-CM

## 2017-10-03 DIAGNOSIS — Z7984 Long term (current) use of oral hypoglycemic drugs: Secondary | ICD-10-CM

## 2017-10-03 DIAGNOSIS — I10 Essential (primary) hypertension: Secondary | ICD-10-CM

## 2017-10-03 DIAGNOSIS — Z9221 Personal history of antineoplastic chemotherapy: Secondary | ICD-10-CM | POA: Diagnosis not present

## 2017-10-03 DIAGNOSIS — I4892 Unspecified atrial flutter: Secondary | ICD-10-CM

## 2017-10-03 DIAGNOSIS — Z7901 Long term (current) use of anticoagulants: Secondary | ICD-10-CM

## 2017-10-03 DIAGNOSIS — I4891 Unspecified atrial fibrillation: Secondary | ICD-10-CM

## 2017-10-03 DIAGNOSIS — Z923 Personal history of irradiation: Secondary | ICD-10-CM

## 2017-10-03 DIAGNOSIS — C3431 Malignant neoplasm of lower lobe, right bronchus or lung: Secondary | ICD-10-CM

## 2017-10-03 DIAGNOSIS — M1990 Unspecified osteoarthritis, unspecified site: Secondary | ICD-10-CM | POA: Diagnosis not present

## 2017-10-03 DIAGNOSIS — C3491 Malignant neoplasm of unspecified part of right bronchus or lung: Secondary | ICD-10-CM

## 2017-10-03 DIAGNOSIS — C3490 Malignant neoplasm of unspecified part of unspecified bronchus or lung: Secondary | ICD-10-CM

## 2017-10-03 DIAGNOSIS — I2510 Atherosclerotic heart disease of native coronary artery without angina pectoris: Secondary | ICD-10-CM | POA: Diagnosis not present

## 2017-10-03 NOTE — Progress Notes (Signed)
[name] Telephone:([phone]) [phone]   Fax:(336) [phone]  OFFICE PROGRESS NOTE  Nicole Lima, MD [address] [place] [address]  DIAGNOSIS: Stage IIIa (T1b, N2, M0) non-small cell lung cancer, adenocarcinoma presented with right lower lobe lung mass in addition to right hilar and subcarinal lymphadenopathy diagnosed in June 2018.  Biomarker Findings Tumor Mutational Burden - TMB-Intermediate (8 Muts/Mb) Microsatellite Status - Cannot Be Determined Genomic Findings For a complete list of the genes assayed, please refer to the Appendix. EGFR E709K, G719S WJX9JY N829F AOZ3Y8 splice site 657-8I>O 7 Disease relevant genes with no reportable alterations: KRAS, ALK, BRAF, MET, ERBB2, RET, ROS1  PDL1 Expression 70%.  PRIOR THERAPY: 1) Concurrent chemoradiation with weekly carboplatin for AUC of 2 and paclitaxel 45 MG/M2. Status post 7 cycles with partial response. 2) Consolidation treatment with immunotherapy with Imfinzi (Durvalumab) 10 MG/KG every 2 weeks, first dose 10/19/2016.  Status post 5 cycles.  Discontinued secondary to intolerance and persistent pneumonitis.  CURRENT THERAPY: Observation.  INTERVAL HISTORY: Nicole [name] 77 y.o. female returns to the clinic today for 3 months follow-up visit the patient is feeling very well today with no concerning complaints.  She denied having any chest pain, shortness of breath, cough or hemoptysis.  She denied having any weight loss or night sweats.  She has no nausea, vomiting, diarrhea or constipation.  She mentions that her quality of life is great.  She is here today for evaluation with repeat CT scan of the chest for restaging of her disease.  MEDICAL HISTORY: Past Medical History:  Diagnosis Date  . A-fib ([place])   . Adenocarcinoma of right lung, stage 3 ([employer]) 07/28/2016  . Atrial flutter ([place]) 10/05/2016  . Bronchitis    hx of  . Cancer [facility]) 2004   uterine/cervical  . Cancer-related pain  10/06/2016  . Coronary artery disease 02/21/2017   JAN 2019 Prox RCA lesion is 25% stenosed. Prox Cx lesion is 30% stenosed. Mid Cx lesion is 25% stenosed. Ost 3rd Mrg lesion is 30% stenosed. Prox LAD lesion is 25% stenosed. Mid LAD lesion is 25% stenosed. The left ventricular systolic function is normal. LV end diastolic pressure is normal. The left ventricular ejection fraction is 55-65% by visual estimate. There is no aortic valve stenosis.   N  . Diabetes mellitus    type 2  . Edema 07/12/2017  . Essential hypertension 01/20/2009  . Gallstones   . Headache   . History of radiation therapy 08/09/2016 to 09/19/2016   Right lung was treated to 60 Gy in 30 fractions at 2 Gy per fraction  . Hypertension   . Hypertriglyceridemia 08/22/2012  . Low back pain   . Lung mass    with lymphadenopathy  . Osteoarthritis   . Type II diabetes mellitus with manifestations ([place]) 01/20/2009   Estimated Creatinine Clearance: 49.8 mL/min (by C-G formula based on SCr of 1 mg/dL).    ALLERGIES:  is allergic to no known allergies.  MEDICATIONS:  Current Outpatient Medications  Medication Sig Dispense Refill  . apixaban (ELIQUIS) 5 MG TABS tablet Take 5 mg by mouth 2 (two) times daily.    . COLLAGEN PO Take 1 tablet by mouth daily.     [name] diltiazem (CARDIZEM CD) 360 MG 24 hr capsule Take 1 capsule (360 mg total) by mouth daily. 90 capsule 3  . glucose blood (ACCU-CHEK GUIDE) test strip 1 each by Other route daily. Use to check blood  sugars daily DX code: E11.8 100 each 3  . glucose blood (COOL BLOOD GLUCOSE TEST STRIPS) test strip Use as instructed to check blood sugar twice daily. DX: E11.9 200 each 3  . irbesartan (AVAPRO) 300 MG tablet TAKE 1 TABLET BY MOUTH ONCE DAILY 90 tablet 1  . Lancets (ACCU-CHEK SOFT TOUCH) lancets Use to check blood sugars daily Dx E11.9 100 each 3  . metFORMIN (GLUCOPHAGE-XR) 500 MG 24 hr tablet Take 2 tablets (1,000 mg total) by mouth daily with breakfast. 180 tablet 3   No  current facility-administered medications for this visit.     SURGICAL HISTORY:  Past Surgical History:  Procedure Laterality Date  . ABDOMINAL HYSTERECTOMY  2004  . APPENDECTOMY  when 77 years old  . COLONOSCOPY  08-05-09   [name]  . INCONTINENCE SURGERY    . KNEE ARTHROSCOPY Left   . LEFT HEART CATH AND CORONARY ANGIOGRAPHY N/A 02/24/2017   Procedure: LEFT HEART CATH AND CORONARY ANGIOGRAPHY;  Surgeon: Jettie Booze, MD;  Location: [place] CV LAB;  Service: Cardiovascular;  Laterality: N/A;  . LEFT HEART CATHETERIZATION WITH CORONARY ANGIOGRAM N/A 07/19/2013   Procedure: LEFT HEART CATHETERIZATION WITH CORONARY ANGIOGRAM;  Surgeon: Sinclair Grooms, MD;  Location: [facility] CATH LAB;  Service: Cardiovascular;  Laterality: N/A;  . POLYPECTOMY  08-05-09   2 polyps  . TOTAL KNEE ARTHROPLASTY Left 03/28/2012   Procedure: LEFT TOTAL KNEE ARTHROPLASTY;  Surgeon: Tobi Bastos, MD;  [facility];  Service: Orthopedics;  Laterality: Left;  . TUBAL LIGATION    . VIDEO BRONCHOSCOPY WITH ENDOBRONCHIAL ULTRASOUND N/A 07/11/2016   Procedure: VIDEO BRONCHOSCOPY WITH ENDOBRONCHIAL ULTRASOUND;  Surgeon: Marshell Garfinkel, MD;  Location: [place];  Service: Pulmonary;  Laterality: N/A;    REVIEW OF SYSTEMS:  A comprehensive review of systems was negative.   PHYSICAL EXAMINATION: General appearance: alert, cooperative and no distress Head: Normocephalic, without obvious abnormality, atraumatic Neck: no adenopathy, no JVD, supple, symmetrical, trachea midline and thyroid not enlarged, symmetric, no tenderness/mass/nodules Lymph nodes: Cervical, supraclavicular, and axillary nodes normal. Resp: clear to auscultation bilaterally Back: symmetric, no curvature. ROM normal. No CVA tenderness. Cardio: regular rate and rhythm, S1, S2 normal, no murmur, click, rub or gallop GI: soft, non-tender; bowel sounds normal; no masses,  no organomegaly Extremities: extremities normal, atraumatic, no cyanosis or  edema  ECOG PERFORMANCE STATUS: 1 - Symptomatic but completely ambulatory  Blood pressure (!) 145/73, pulse 81, temperature 98.7 F (37.1 C), temperature source Oral, resp. rate 18, height 5' 5"  (1.651 m), weight 175 lb 14.4 oz (79.8 kg), SpO2 99 %.  LABORATORY DATA: Lab Results  Component Value Date   WBC 6.2 09/29/2017   HGB 13.5 09/29/2017   HCT 40.2 09/29/2017   MCV 90.7 09/29/2017   PLT 235 09/29/2017      Chemistry      Component Value Date/Time   NA 137 09/29/2017 1109   NA 140 02/21/2017 1238   NA 139 01/26/2017 0915   K 4.0 09/29/2017 1109   K 4.1 01/26/2017 0915   CL 102 09/29/2017 1109   CO2 27 09/29/2017 1109   CO2 24 01/26/2017 0915   BUN 23 09/29/2017 1109   BUN 31 (H) 02/21/2017 1238   BUN 22.0 01/26/2017 0915   CREATININE 0.99 09/29/2017 1109   CREATININE 0.9 01/26/2017 0915      Component Value Date/Time   CALCIUM 9.8 09/29/2017 1109   CALCIUM 9.2 01/26/2017 0915   ALKPHOS 67 09/29/2017 1109  ALKPHOS 72 01/26/2017 0915   AST 14 (L) 09/29/2017 1109   AST 13 01/26/2017 0915   ALT 13 09/29/2017 1109   ALT 12 01/26/2017 0915   BILITOT 1.0 09/29/2017 1109   BILITOT 0.37 01/26/2017 0915       RADIOGRAPHIC STUDIES: Ct Chest W Contrast  Result Date: 09/29/2017 CLINICAL DATA:  Lung cancer, diagnosed 2018, chemotherapy, XRT, and immunotherapy complete. Cough, shortness of breath. EXAM: CT CHEST WITH CONTRAST TECHNIQUE: Multidetector CT imaging of the chest was performed during intravenous contrast administration. CONTRAST:  60m OMNIPAQUE IOHEXOL 300 MG/ML  SOLN COMPARISON:  05/22/2017 FINDINGS: Cardiovascular: Heart is normal in size.  No pericardial effusion. No evidence of thoracic aortic aneurysm. Atherosclerotic calcifications of the aortic arch. Coronary atherosclerosis the LAD and right coronary artery. Mediastinum/Nodes: No suspicious mediastinal lymphadenopathy. Visualized thyroid is unremarkable. Lungs/Pleura: Radiation changes in the right  paramediastinal lung and medial right lower lobe. Left lung is essentially clear. 3 mm nodule in the anterior left upper lobe (series 7/image 40), unchanged from multiple priors, benign. 4 mm nodule in the posterior right upper lobe (series 7/image 44), also unchanged, benign. No suspicious pulmonary nodules. No focal consolidation. No pleural effusion or pneumothorax. Upper Abdomen: Visualized upper abdomen is notable for prior cholecystectomy. Musculoskeletal: Degenerative changes of the visualized thoracolumbar spine. IMPRESSION: Radiation changes in the right paramediastinal lung and medial right lower lobe. No evidence of recurrent or metastatic disease. Aortic Atherosclerosis (ICD10-I70.0). Electronically Signed   By: SJulian HyM.D.   On: 09/29/2017 16:44    ASSESSMENT AND PLAN: This is a very pleasant 77years old Asian female recently diagnosed with a stage IIIa non-small cell4 lung cancer, adenocarcinoma. She underwent a course of concurrent chemoradiation with weekly carboplatin and paclitaxel status post 7 cycles. The patient tolerated the previous course fairly well except for mild odynophagia and dry cough. The patient was a started on consolidation treatment with immunotherapy with Imfinzi (Durvalumab) status post 5 cycles.  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 She was treated with a taper dose of prednisone and she felt much better. She has been in observation for more than 9 months now.  The patient is feeling well with no concerning complaints.  She had repeat CT scan of the chest performed recently.  I personally and independently reviewed the scans and discussed the results with the patient today.  Her scan showed no concerning findings for disease recurrence. I recommended for the patient to continue on observation with repeat CT scan of the chest in 3 months for restaging of her disease. She was advised to call immediately if she has any concerning symptoms  in the interval. The patient voices understanding of current disease status and treatment options and is in agreement with the current care plan. All questions were answered. The patient knows to call the clinic with any problems, questions or concerns. We can certainly see the patient much sooner if necessary.  Disclaimer: This note was dictated with voice recognition software. Similar sounding words can inadvertently be transcribed and may not be corrected upon review.

## 2017-10-03 NOTE — Telephone Encounter (Signed)
Per 9/10 patient will be out of town and her return visit date was changed per [facility] approval

## 2017-10-03 NOTE — Telephone Encounter (Signed)
Per patient request and approved by [facility] to change return day. Per 9/10 los Printed avs an d calender of upcoming appointment.

## 2017-10-06 ENCOUNTER — Ambulatory Visit: Payer: Medicare Other | Admitting: Endocrinology

## 2017-10-06 ENCOUNTER — Encounter: Payer: Self-pay | Admitting: Endocrinology

## 2017-10-06 VITALS — BP 120/58 | HR 85 | Ht 65.0 in | Wt 174.8 lb

## 2017-10-06 DIAGNOSIS — E118 Type 2 diabetes mellitus with unspecified complications: Secondary | ICD-10-CM | POA: Diagnosis not present

## 2017-10-06 LAB — POCT GLYCOSYLATED HEMOGLOBIN (HGB A1C): Hemoglobin A1C: 6.8 % — AB (ref 4.0–5.6)

## 2017-10-06 NOTE — Patient Instructions (Signed)
check your blood sugar once a day.  vary the time of day when you check, between before the 3 meals, and at bedtime.  also check if you have symptoms of your blood sugar being too high or too low.  please keep a record of the readings and bring it to your next appointment here (or you can bring the meter itself).  You can write it on any piece of paper.  please call us sooner if your blood sugar goes below 70, or if you have a lot of readings over 200. Please continue the same medication.   Please come back for a follow-up appointment in 3-5 months.

## 2017-10-06 NOTE — Progress Notes (Signed)
Subjective:    Patient ID: Nicole [name], female    DOB: [birth year], 77 y.o.   MRN: [id number]  HPI Pt returns for f/u of diabetes mellitus: DM type: 2 Dx'ed: 3474 Complications: renal insuff and CAD. Therapy: metformin GDM: never DKA: never Severe hypoglycemia: never Pancreatitis: never Pancreatic imaging: 2018 CT: numerous tiny cysts in the pancreas.   Other: she is not a candidate for aggressive glycemic control, due to lung cancer; she takes intermitt steroids.  Interval history: she says cbg's are well-controlled.  pt states she feels well in general.   Past Medical History:  Diagnosis Date  . A-fib ([place])   . Adenocarcinoma of right lung, stage 3 ([place]) 07/28/2016  . Atrial flutter ([place]) 10/05/2016  . Bronchitis    hx of  . Cancer [facility]) 2004   uterine/cervical  . Cancer-related pain 10/06/2016  . Coronary artery disease 02/21/2017   JAN 2019 Prox RCA lesion is 25% stenosed. Prox Cx lesion is 30% stenosed. Mid Cx lesion is 25% stenosed. Ost 3rd Mrg lesion is 30% stenosed. Prox LAD lesion is 25% stenosed. Mid LAD lesion is 25% stenosed. The left ventricular systolic function is normal. LV end diastolic pressure is normal. The left ventricular ejection fraction is 55-65% by visual estimate. There is no aortic valve stenosis.   N  . Diabetes mellitus    type 2  . Edema 07/12/2017  . Essential hypertension 01/20/2009  . Gallstones   . Headache   . History of radiation therapy 08/09/2016 to 09/19/2016   Right lung was treated to 60 Gy in 30 fractions at 2 Gy per fraction  . Hypertension   . Hypertriglyceridemia 08/22/2012  . Low back pain   . Lung mass    with lymphadenopathy  . Osteoarthritis   . Type II diabetes mellitus with manifestations ([place]) 01/20/2009   Estimated Creatinine Clearance: 49.8 mL/min (by C-G formula based on SCr of 1 mg/dL).    Past Surgical History:  Procedure Laterality Date  . ABDOMINAL HYSTERECTOMY  2004  . APPENDECTOMY  when 77 years old  .  COLONOSCOPY  08-05-09   [name]  . INCONTINENCE SURGERY    . KNEE ARTHROSCOPY Left   . LEFT HEART CATH AND CORONARY ANGIOGRAPHY N/A 02/24/2017   Procedure: LEFT HEART CATH AND CORONARY ANGIOGRAPHY;  Surgeon: Jettie Booze, MD;  Location: [place];  Service: Cardiovascular;  Laterality: N/A;  . LEFT HEART CATHETERIZATION WITH CORONARY ANGIOGRAM N/A 07/19/2013   Procedure: LEFT HEART CATHETERIZATION WITH CORONARY ANGIOGRAM;  Surgeon: Sinclair Grooms, MD;  Location: [facility] CATH LAB;  Service: Cardiovascular;  Laterality: N/A;  . POLYPECTOMY  08-05-09   2 polyps  . TOTAL KNEE ARTHROPLASTY Left 03/28/2012   Procedure: LEFT TOTAL KNEE ARTHROPLASTY;  Surgeon: Tobi Bastos, MD;  Location: [facility] ORS;  Service: Orthopedics;  Laterality: Left;  . TUBAL LIGATION    . VIDEO BRONCHOSCOPY WITH ENDOBRONCHIAL ULTRASOUND N/A 07/11/2016   Procedure: VIDEO BRONCHOSCOPY WITH ENDOBRONCHIAL ULTRASOUND;  Surgeon: Marshell Garfinkel, MD;  Location: [facility];  Service: Pulmonary;  Laterality: N/A;    Social History   Socioeconomic History  . Marital status: Married    Spouse name: Not on file  . Number of children: Not on file  . Years of education: Not on file  . Highest education level: Not on file  Occupational History  . Occupation: retired    Fish farm manager: RETIRED  Social Needs  . Financial resource strain: Not on file  . Food  insecurity:    Worry: Not on file    Inability: Not on file  . Transportation needs:    Medical: Not on file    Non-medical: Not on file  Tobacco Use  . Smoking status: Never Smoker  . Smokeless tobacco: Never Used  Substance and Sexual Activity  . Alcohol use: No    Alcohol/week: 0.0 standard drinks  . Drug use: No  . Sexual activity: Not Currently  Lifestyle  . Physical activity:    Days per week: Not on file    Minutes per session: Not on file  . Stress: Not on file  Relationships  . Social connections:    Talks on phone: Not on file    Gets together: Not on  file    Attends religious service: Not on file    Active member of club or organization: Not on file    Attends meetings of clubs or organizations: Not on file    Relationship status: Not on file  . Intimate partner violence:    Fear of current or ex partner: Not on file    Emotionally abused: Not on file    Physically abused: Not on file    Forced sexual activity: Not on file  Other Topics Concern  . Not on file  Social History Narrative   Regular exercise- Yes    Current Outpatient Medications on File Prior to Visit  Medication Sig Dispense Refill  . apixaban (ELIQUIS) 5 MG TABS tablet Take 5 mg by mouth 2 (two) times daily.    . COLLAGEN PO Take 1 tablet by mouth daily.     [name] diltiazem (CARDIZEM CD) 360 MG 24 hr capsule Take 1 capsule (360 mg total) by mouth daily. 90 capsule 3  . glucose blood (ACCU-CHEK GUIDE) test strip 1 each by Other route daily. Use to check blood sugars daily DX code: E11.8 100 each 3  . glucose blood (COOL BLOOD GLUCOSE TEST STRIPS) test strip Use as instructed to check blood sugar twice daily. DX: E11.9 200 each 3  . irbesartan (AVAPRO) 300 MG tablet TAKE 1 TABLET BY MOUTH ONCE DAILY 90 tablet 1  . Lancets (ACCU-CHEK SOFT TOUCH) lancets Use to check blood sugars daily Dx E11.9 100 each 3  . metFORMIN (GLUCOPHAGE-XR) 500 MG 24 hr tablet Take 2 tablets (1,000 mg total) by mouth daily with breakfast. 180 tablet 3   No current facility-administered medications on file prior to visit.     Allergies  Allergen Reactions  . No Known Allergies     Family History  Problem Relation Age of Onset  . Heart attack Father   . Heart disease Father   . Arthritis Other   . Cancer Other        colon, lst degree relative  . Diabetes Other        st degree relative  . Hyperlipidemia Other   . Hypertension Other   . Colon cancer Neg Hx     BP (!) 120/58 (BP Location: Left Arm, Patient Position: Sitting)   Pulse 85   Ht '5\' 5"'$  (1.651 m)   Wt 174 lb 12.8 oz  (79.3 kg)   SpO2 95%   BMI 29.09 kg/m    Review of Systems No weight change.      Objective:   Physical Exam VITAL SIGNS:  See vs page GENERAL: no distress Pulses: foot pulses are intact bilaterally.   MSK: no deformity of the feet or ankles.  CV: trace bilat  edema of the legs, and bilat vv's.   Skin:  no ulcer on the feet or ankles.  normal color and temp on the feet and ankles.   Neuro: sensation is intact to touch on the feet and ankles.    Lab Results  Component Value Date   HGBA1C 6.8 (A) 10/06/2017   Lab Results  Component Value Date   CREATININE 0.99 09/29/2017   BUN 23 09/29/2017   NA 137 09/29/2017   K 4.0 09/29/2017   CL 102 09/29/2017   CO2 27 09/29/2017      Assessment & Plan:  Type 2 DM, with CAD: well-controlled Edema: this limits rx options  Patient Instructions  check your blood sugar once a day.  vary the time of day when you check, between before the 3 meals, and at bedtime.  also check if you have symptoms of your blood sugar being too high or too low.  please keep a record of the readings and bring it to your next appointment here (or you can bring the meter itself).  You can write it on any piece of paper.  please call us sooner if your blood sugar goes below 70, or if you have a lot of readings over 200. Please continue the same medication.   Please come back for a follow-up appointment in 3-5 months.

## 2017-10-09 DIAGNOSIS — E119 Type 2 diabetes mellitus without complications: Secondary | ICD-10-CM | POA: Diagnosis not present

## 2017-10-11 ENCOUNTER — Other Ambulatory Visit: Payer: Self-pay | Admitting: Internal Medicine

## 2017-11-15 ENCOUNTER — Ambulatory Visit: Payer: Medicare Other | Admitting: Internal Medicine

## 2017-11-15 ENCOUNTER — Encounter: Payer: Self-pay | Admitting: Internal Medicine

## 2017-11-15 ENCOUNTER — Other Ambulatory Visit: Payer: Medicare Other

## 2017-11-15 VITALS — BP 130/80 | HR 90 | Temp 98.1°F | Resp 16 | Ht 65.0 in | Wt 182.0 lb

## 2017-11-15 DIAGNOSIS — Z23 Encounter for immunization: Secondary | ICD-10-CM

## 2017-11-15 DIAGNOSIS — R8290 Unspecified abnormal findings in urine: Secondary | ICD-10-CM

## 2017-11-15 DIAGNOSIS — I483 Typical atrial flutter: Secondary | ICD-10-CM | POA: Diagnosis not present

## 2017-11-15 DIAGNOSIS — R7989 Other specified abnormal findings of blood chemistry: Secondary | ICD-10-CM | POA: Diagnosis not present

## 2017-11-15 DIAGNOSIS — I10 Essential (primary) hypertension: Secondary | ICD-10-CM

## 2017-11-15 LAB — POCT URINALYSIS DIPSTICK
Bilirubin, UA: NEGATIVE
Blood, UA: NEGATIVE
Glucose, UA: NEGATIVE
Ketones, UA: NEGATIVE
Leukocytes, UA: NEGATIVE
Nitrite, UA: NEGATIVE
Protein, UA: NEGATIVE
Spec Grav, UA: 1.015 (ref 1.010–1.025)
Urobilinogen, UA: 0.2 E.U./dL
pH, UA: 6.5 (ref 5.0–8.0)

## 2017-11-15 NOTE — Progress Notes (Signed)
Subjective:  Patient ID: Nicole [name], female    DOB: 1940-12-10  Age: 77 y.o. MRN: [id number]  CC: Urinary Tract Infection   HPI GESENIA BANTZ presents for a 3-day history of foul-smelling urine, urinary frequency, and urgency.  She denies abdominopelvic pain, flank pain, nausea, vomiting, fever, or chills.  Outpatient Medications Prior to Visit  Medication Sig Dispense Refill  . benzonatate (TESSALON) 100 MG capsule TAKE 2 CAPSULES BY MOUTH THREE TIMES DAILY AS NEEDED FOR COUGH 180 capsule 0  . COLLAGEN PO Take 1 tablet by mouth daily.     [name] diltiazem (CARDIZEM CD) 360 MG 24 hr capsule Take 1 capsule (360 mg total) by mouth daily. 90 capsule 3  . glucose blood (ACCU-CHEK GUIDE) test strip 1 each by Other route daily. Use to check blood sugars daily DX code: E11.8 100 each 3  . glucose blood (COOL BLOOD GLUCOSE TEST STRIPS) test strip Use as instructed to check blood sugar twice daily. DX: E11.9 200 each 3  . irbesartan (AVAPRO) 300 MG tablet TAKE 1 TABLET BY MOUTH ONCE DAILY 90 tablet 1  . Lancets (ACCU-CHEK SOFT TOUCH) lancets Use to check blood sugars daily Dx E11.9 100 each 3  . metFORMIN (GLUCOPHAGE-XR) 500 MG 24 hr tablet Take 2 tablets (1,000 mg total) by mouth daily with breakfast. 180 tablet 3  . apixaban (ELIQUIS) 5 MG TABS tablet Take 5 mg by mouth 2 (two) times daily.     No facility-administered medications prior to visit.     ROS Review of Systems  Constitutional: Negative for chills, fatigue and fever.  HENT: Negative.   Eyes: Negative.   Respiratory: Negative for cough, shortness of breath and wheezing.   Cardiovascular: Negative for chest pain, palpitations and leg swelling.  Gastrointestinal: Negative for abdominal pain, constipation, diarrhea and nausea.  Endocrine: Negative.   Genitourinary: Positive for dysuria, frequency and urgency. Negative for decreased urine volume, difficulty urinating, flank pain, hematuria and pelvic pain.  Musculoskeletal:  Negative for arthralgias and myalgias.  Skin: Negative.   Neurological: Negative.  Negative for dizziness, weakness and light-headedness.  Hematological: Negative for adenopathy. Does not bruise/bleed easily.  Psychiatric/Behavioral: Negative.     Objective:  BP 130/80 (BP Location: Left Arm, Patient Position: Sitting, Cuff Size: Normal)   Pulse 90   Temp 98.1 F (36.7 C) (Oral)   Resp 16   Ht 5\' 5"  (1.651 m)   Wt 182 lb (82.6 kg)   SpO2 98%   BMI 30.29 kg/m   BP Readings from Last 3 Encounters:  11/15/17 130/80  10/06/17 (!) 120/58  10/03/17 (!) 145/73    Wt Readings from Last 3 Encounters:  11/15/17 182 lb (82.6 kg)  10/06/17 174 lb 12.8 oz (79.3 kg)  10/03/17 175 lb 14.4 oz (79.8 kg)    Physical Exam  Constitutional: She is oriented to person, place, and time. No distress.  HENT:  Mouth/Throat: Oropharynx is clear and moist. No oropharyngeal exudate.  Eyes: Conjunctivae are normal. No scleral icterus.  Neck: Normal range of motion. Neck supple. No JVD present. No thyromegaly present.  Cardiovascular: Normal rate, regular rhythm and normal heart sounds. Exam reveals no gallop.  No murmur heard. Pulmonary/Chest: Effort normal and breath sounds normal. No respiratory distress. She has no wheezes. She has no rales.  Abdominal: Soft. Bowel sounds are normal. She exhibits no mass. There is no hepatosplenomegaly. There is no tenderness. There is no CVA tenderness.  Musculoskeletal: Normal range of motion. She exhibits no  edema, tenderness or deformity.  Neurological: She is alert and oriented to person, place, and time.  Skin: She is not diaphoretic.  Vitals reviewed.   Lab Results  Component Value Date   WBC 6.2 09/29/2017   HGB 13.5 09/29/2017   HCT 40.2 09/29/2017   PLT 235 09/29/2017   GLUCOSE 138 (H) 09/29/2017   CHOL 203 (H) 02/02/2017   TRIG 147.0 02/02/2017   HDL 61.10 02/02/2017   LDLDIRECT 74.0 06/14/2016   LDLCALC 113 (H) 02/02/2017   ALT 13  09/29/2017   AST 14 (L) 09/29/2017   NA 137 09/29/2017   K 4.0 09/29/2017   CL 102 09/29/2017   CREATININE 0.99 09/29/2017   BUN 23 09/29/2017   CO2 27 09/29/2017   TSH 0.68 11/15/2017   INR 1.0 02/21/2017   HGBA1C 6.8 (A) 10/06/2017   MICROALBUR 80 08/03/2017   Color, UA  yellow         Clarity, UA  cloudy         Glucose, UA Negative Negative         Bilirubin, UA  negative         Ketones, UA  negative         Spec Grav, UA 1.010 - 1.025 1.015         Blood, UA  negative         pH, UA 5.0 - 8.0 6.5         Protein, UA Negative Negative         Urobilinogen, UA 0.2 or 1.0 E.U./dL 0.2   0.2 R 0.2 R 0.2 R 0.2 R 0.2 R  Nitrite, UA  negative         Leukocytes, UA Negative Negative              Ct Chest W Contrast  Result Date: 09/29/2017 CLINICAL DATA:  Lung cancer, diagnosed 2018, chemotherapy, XRT, and immunotherapy complete. Cough, shortness of breath. EXAM: CT CHEST WITH CONTRAST TECHNIQUE: Multidetector CT imaging of the chest was performed during intravenous contrast administration. CONTRAST:  39mL OMNIPAQUE IOHEXOL 300 MG/ML  SOLN COMPARISON:  05/22/2017 FINDINGS: Cardiovascular: Heart is normal in size.  No pericardial effusion. No evidence of thoracic aortic aneurysm. Atherosclerotic calcifications of the aortic arch. Coronary atherosclerosis the LAD and right coronary artery. Mediastinum/Nodes: No suspicious mediastinal lymphadenopathy. Visualized thyroid is unremarkable. Lungs/Pleura: Radiation changes in the right paramediastinal lung and medial right lower lobe. Left lung is essentially clear. 3 mm nodule in the anterior left upper lobe (series 7/image 40), unchanged from multiple priors, benign. 4 mm nodule in the posterior right upper lobe (series 7/image 44), also unchanged, benign. No suspicious pulmonary nodules. No focal consolidation. No pleural effusion or pneumothorax. Upper Abdomen: Visualized upper abdomen is notable for prior cholecystectomy. Musculoskeletal:  Degenerative changes of the visualized thoracolumbar spine. IMPRESSION: Radiation changes in the right paramediastinal lung and medial right lower lobe. No evidence of recurrent or metastatic disease. Aortic Atherosclerosis (ICD10-I70.0). Electronically Signed   By: Julian Hy M.D.   On: 09/29/2017 16:44    Assessment & Plan:   Velora was seen today for urinary tract infection.  Diagnoses and all orders for this visit:  Malodorous urine- Her urinalysis is normal.  If her urine culture is positive then I will treat. -     POCT urinalysis dipstick -     CULTURE, URINE COMPREHENSIVE; Future  Need for influenza vaccination -     Flu vaccine HIGH DOSE PF  Essential hypertension- Her blood pressure is well controlled. -     Thyroid Panel With TSH; Future  Typical atrial flutter ([place])- She has good rate and rhythm control.  I will monitor her TFTs. -     Thyroid Panel With TSH; Future  Low TSH level -     Thyroid Panel With TSH; Future   I am having Nicole [name] maintain her COLLAGEN PO, glucose blood, accu-chek soft touch, metFORMIN, apixaban, irbesartan, diltiazem, glucose blood, and benzonatate.  No orders of the defined types were placed in this encounter.    Follow-up: No follow-ups on file.  Scarlette Calico, MD

## 2017-11-16 ENCOUNTER — Encounter: Payer: Self-pay | Admitting: Internal Medicine

## 2017-11-16 LAB — THYROID PANEL WITH TSH
Free Thyroxine Index: 3 (ref 1.4–3.8)
T3 Uptake: 39 % — ABNORMAL HIGH (ref 22–35)
T4, Total: 7.6 ug/dL (ref 5.1–11.9)
TSH: 0.68 mIU/L (ref 0.40–4.50)

## 2017-11-16 NOTE — Patient Instructions (Signed)

## 2017-11-19 ENCOUNTER — Other Ambulatory Visit: Payer: Self-pay | Admitting: Internal Medicine

## 2017-11-19 ENCOUNTER — Encounter: Payer: Self-pay | Admitting: Internal Medicine

## 2017-11-19 DIAGNOSIS — N390 Urinary tract infection, site not specified: Secondary | ICD-10-CM

## 2017-11-19 DIAGNOSIS — B9620 Unspecified Escherichia coli [E. coli] as the cause of diseases classified elsewhere: Secondary | ICD-10-CM

## 2017-11-19 LAB — CULTURE, URINE COMPREHENSIVE
MICRO NUMBER:: 91274986
SPECIMEN QUALITY:: ADEQUATE

## 2017-11-19 MED ORDER — AMPICILLIN 500 MG PO CAPS: 500.0000 mg | ORAL_CAPSULE | Freq: Three times a day (TID) | ORAL | 0 refills | Status: AC

## 2017-12-07 ENCOUNTER — Other Ambulatory Visit: Payer: Self-pay | Admitting: Interventional Cardiology

## 2017-12-07 NOTE — Telephone Encounter (Signed)
Pt last saw Ermalinda Barrios, PA on 09/20/17, last labs 09/29/17 Creat 0.99, age 77, weight 82.6, based on specified criteria pt is on appropriate dosage of Eliquis 5mg  BID.  Will refill rx.

## 2017-12-25 ENCOUNTER — Telehealth: Payer: Self-pay

## 2017-12-25 NOTE — Telephone Encounter (Signed)
Per Renaldo Reel to move appointment for lab, and visit. Per 12/2 walk in

## 2017-12-26 NOTE — Progress Notes (Signed)
Cardiology Office Note   Date:  12/27/2017   ID:  Cassady, Stanczak [birth year], MRN [id number]  PCP:  [name], MD    No chief complaint on file.  Atrial flutter  Wt Readings from Last 3 Encounters:  12/27/17 184 lb (83.5 kg)  11/15/17 182 lb (82.6 kg)  10/06/17 174 lb 12.8 oz (79.3 kg)       History of Present Illness: Nicole [name] is a 77 y.o. female  has history of lung cancer status post radiation and was felt she might have radiation pneumonitis and was treated with steroids which seemed to help her cough. Cardiac catheterization 02/24/2017 showed nonobstructive CAD with 25% proximal RCA, 30% proximal circumflex, 25% mid circumflex, 30% ostial third marginal, 25% proximal and mid LAD, normal LVEF 55 to 65%, no aortic valve stenosis. Aggressive medical therapy recommended   I saw the patient 06/06/2017 at which time she was having rectal bleeding on Eliquis ( for atrial flutter) felt secondary to hemorrhoids. She had stopped her Eliquis andthe rectal bleeding resolved. Stool cards were negative and hemoglobin was stable. Colonoscopy found diverticulosis as well as hemorrhoids which seem to be the cause of the rectal bleeding. If recurrent rectal bleeding he recommended surgical removal of the hemorrhoids   In July 2019, she was complaining of increased stress taking care of her husband.  She was tachycardic in the office just despite taking her diltiazem.  48-hour monitor showed normal sinus rhythm with occasional PACs and PVCs, no significant bradycardia average heart rate 96 bpm.  On follow-up in July she was still under a lot of stress taking care of her husband as well over 2 grandchildren one who is autistic.  Heart rate was still 98 blood pressure was up.  We increased her diltiazem to 360 mg daily.  Had some lower extremity edema and we discussed possible diuretic pressure remained elevated.  Since the last visit, she has had some stress with her husband's  health- he has had severe bleeding requiring transfusion.  She is not sure that he will survive this.  Apparently, he had bypass surgery 40 years ago in [place].  It appears that now he has an LVAD.    She has had leg swelling.  She feels SHOB at rest.   She had sx with her atrial flutter in the past.   Past Medical History:  Diagnosis Date  . A-fib ([place])   . Adenocarcinoma of right lung, stage 3 ([place]) 07/28/2016  . Atrial flutter ([place]) 10/05/2016  . Bronchitis    hx of  . Cancer [facility]) 2004   uterine/cervical  . Cancer-related pain 10/06/2016  . Coronary artery disease 02/21/2017   JAN 2019 Prox RCA lesion is 25% stenosed. Prox Cx lesion is 30% stenosed. Mid Cx lesion is 25% stenosed. Ost 3rd Mrg lesion is 30% stenosed. Prox LAD lesion is 25% stenosed. Mid LAD lesion is 25% stenosed. The left ventricular systolic function is normal. LV end diastolic pressure is normal. The left ventricular ejection fraction is 55-65% by visual estimate. There is no aortic valve stenosis.   N  . Diabetes mellitus    type 2  . Edema 07/12/2017  . Essential hypertension 01/20/2009  . Gallstones   . Headache   . History of radiation therapy 08/09/2016 to 09/19/2016   Right lung was treated to 60 Gy in 30 fractions at 2 Gy per fraction  . Hypertension   . Hypertriglyceridemia 08/22/2012  . Low back pain   .  Lung mass    with lymphadenopathy  . Osteoarthritis   . Type II diabetes mellitus with manifestations ([place]) 01/20/2009   Estimated Creatinine Clearance: 49.8 mL/min (by C-G formula based on SCr of 1 mg/dL).    Past Surgical History:  Procedure Laterality Date  . ABDOMINAL HYSTERECTOMY  2004  . APPENDECTOMY  when 77 years old  . COLONOSCOPY  08-05-09   [name]  . INCONTINENCE SURGERY    . KNEE ARTHROSCOPY Left   . LEFT HEART CATH AND CORONARY ANGIOGRAPHY N/A 02/24/2017   Procedure: LEFT HEART CATH AND CORONARY ANGIOGRAPHY;  Surgeon: Jettie Booze, MD;  Location: [place] CV LAB;  Service:  Cardiovascular;  Laterality: N/A;  . LEFT HEART CATHETERIZATION WITH CORONARY ANGIOGRAM N/A 07/19/2013   Procedure: LEFT HEART CATHETERIZATION WITH CORONARY ANGIOGRAM;  Surgeon: Sinclair Grooms, MD;  Location: [facility] CATH LAB;  Service: Cardiovascular;  Laterality: N/A;  . POLYPECTOMY  08-05-09   2 polyps  . TOTAL KNEE ARTHROPLASTY Left 03/28/2012   Procedure: LEFT TOTAL KNEE ARTHROPLASTY;  Surgeon: Tobi Bastos, MD;  Location: [facility] ORS;  Service: Orthopedics;  Laterality: Left;  . TUBAL LIGATION    . VIDEO BRONCHOSCOPY WITH ENDOBRONCHIAL ULTRASOUND N/A 07/11/2016   Procedure: VIDEO BRONCHOSCOPY WITH ENDOBRONCHIAL ULTRASOUND;  Surgeon: Marshell Garfinkel, MD;  Location: [place];  Service: Pulmonary;  Laterality: N/A;     Current Outpatient Medications  Medication Sig Dispense Refill  . benzonatate (TESSALON) 100 MG capsule TAKE 2 CAPSULES BY MOUTH THREE TIMES DAILY AS NEEDED FOR COUGH 180 capsule 0  . COLLAGEN PO Take 1 tablet by mouth daily.     [name] diltiazem (CARDIZEM CD) 360 MG 24 hr capsule Take 1 capsule (360 mg total) by mouth daily. 90 capsule 3  . glucose blood (ACCU-CHEK GUIDE) test strip 1 each by Other route daily. Use to check blood sugars daily DX code: E11.8 100 each 3  . glucose blood (COOL BLOOD GLUCOSE TEST STRIPS) test strip Use as instructed to check blood sugar twice daily. DX: E11.9 200 each 3  . irbesartan (AVAPRO) 300 MG tablet TAKE 1 TABLET BY MOUTH ONCE DAILY 90 tablet 1  . Lancets (ACCU-CHEK SOFT TOUCH) lancets Use to check blood sugars daily Dx E11.9 100 each 3  . metFORMIN (GLUCOPHAGE-XR) 500 MG 24 hr tablet Take 2 tablets (1,000 mg total) by mouth daily with breakfast. 180 tablet 3  . ELIQUIS 5 MG TABS tablet TAKE 1 TABLET BY MOUTH TWICE A DAY (Patient not taking: Reported on 12/27/2017) 60 tablet 9   No current facility-administered medications for this visit.     Allergies:   No known allergies    Social History:  The patient  reports that she has never smoked. She  has never used smokeless tobacco. She reports that she does not drink alcohol or use drugs.   Family History:  The patient's family history includes Arthritis in her other; Cancer in her other; Diabetes in her other; Heart attack in her father; Heart disease in her father; Hyperlipidemia in her other; Hypertension in her other.    ROS:  Please see the history of present illness.   Otherwise, review of systems are positive for stress.   All other systems are reviewed and negative.    PHYSICAL EXAM: VS:  BP 132/74   Pulse 96   Ht 5\' 5"  (1.651 m)   Wt 184 lb (83.5 kg)   SpO2 98%   BMI 30.62 kg/m  , BMI Body mass index  is 30.62 kg/m. GEN: Well nourished, well developed, in no acute distress  HEENT: normal  Neck: no JVD, carotid bruits, or masses Cardiac: RRR; no murmurs, rubs, or gallops,no edema  Respiratory:  clear to auscultation bilaterally, normal work of breathing GI: soft, nontender, nondistended, + BS MS: no deformity or atrophy  Skin: warm and dry, no rash Neuro:  Strength and sensation are intact Psych: euthymic mood, full affect    Recent Labs: 02/02/2017: Pro B Natriuretic peptide (BNP) 33.0 09/29/2017: ALT 13; BUN 23; Creatinine 0.99; Hemoglobin 13.5; Platelet Count 235; Potassium 4.0; Sodium 137 11/15/2017: TSH 0.68   Lipid Panel    Component Value Date/Time   CHOL 203 (H) 02/02/2017 1709   TRIG 147.0 02/02/2017 1709   HDL 61.10 02/02/2017 1709   CHOLHDL 3 02/02/2017 1709   VLDL 29.4 02/02/2017 1709   LDLCALC 113 (H) 02/02/2017 1709   LDLDIRECT 74.0 06/14/2016 1356     Other studies Reviewed: Additional studies/ records that were reviewed today with results demonstrating: .   ASSESSMENT AND PLAN:  1. Atrial flutter: Having rectal bleeding on Eliquis.  She stopped this on her own and would like to come off of it.  We discussed the stroke risk.  She felt her atrial flutter in the past.  It occurred in the setting of chemotherapy.  She will continue to hold  her Eliquis.  If she does feel symptoms of atrial flutter, she may restart at that time.  Given all of the stressors happening in her life, she does not want to add any bleeding complications.  She does note some mild rectal bleeding on the Eliquis, which resolves when she stops it. 2. HTN: The current medical regimen is effective;  continue present plan and medications. 3. CAD: Mild in the past.  No angina on medical therapy.  4. DM2: A1C 6.8 in 9/19.   5. Increased TG: Continue healthy diet.  Better controlled in January 2019. 6. LE edema: Elevate legs.  She will try getting different compression stockings that fit better.  I do not think she needs a diuretic at this time.  Normal BNP in January 2019.  She has a CT scan of her chest scheduled for later this week.  This would certainly tell us if there was extra fluid in her chest.  This patients CHA2DS2-VASc Score and unadjusted Ischemic Stroke Rate (% per year) is equal to 4.8 % stroke rate/year from a score of 4  Above score calculated as 1 point each if present [CHF, HTN, DM, Vascular=MI/PAD/Aortic Plaque, Age if 65-74, or Female] Above score calculated as 2 points each if present [Age > 75, or Stroke/TIA/TE]     Current medicines are reviewed at length with the patient today.  The patient concerns regarding her medicines were addressed.  The following changes have been made:  Continuing to hold Eliquis  Labs/ tests ordered today include:  No orders of the defined types were placed in this encounter.   Recommend 150 minutes/week of aerobic exercise Low fat, low carb, high fiber diet recommended  Disposition:   FU in 6 months   Signed, Larae Grooms, MD  12/27/2017 11:46 AM    [facility] [address], [place], [place]  [address] Phone: [phone]; Fax: [phone]

## 2017-12-27 ENCOUNTER — Encounter: Payer: Self-pay | Admitting: Interventional Cardiology

## 2017-12-27 ENCOUNTER — Ambulatory Visit: Payer: Medicare Other | Admitting: Interventional Cardiology

## 2017-12-27 VITALS — BP 132/74 | HR 96 | Ht 65.0 in | Wt 184.0 lb

## 2017-12-27 DIAGNOSIS — I4892 Unspecified atrial flutter: Secondary | ICD-10-CM

## 2017-12-27 DIAGNOSIS — I2510 Atherosclerotic heart disease of native coronary artery without angina pectoris: Secondary | ICD-10-CM

## 2017-12-27 DIAGNOSIS — E118 Type 2 diabetes mellitus with unspecified complications: Secondary | ICD-10-CM | POA: Diagnosis not present

## 2017-12-27 DIAGNOSIS — I10 Essential (primary) hypertension: Secondary | ICD-10-CM

## 2017-12-27 DIAGNOSIS — E781 Pure hyperglyceridemia: Secondary | ICD-10-CM

## 2017-12-27 DIAGNOSIS — R600 Localized edema: Secondary | ICD-10-CM

## 2017-12-27 NOTE — Patient Instructions (Signed)

## 2017-12-28 ENCOUNTER — Encounter: Payer: Self-pay | Admitting: Internal Medicine

## 2017-12-28 ENCOUNTER — Other Ambulatory Visit (INDEPENDENT_AMBULATORY_CARE_PROVIDER_SITE_OTHER): Payer: Medicare Other

## 2017-12-28 ENCOUNTER — Ambulatory Visit: Payer: Medicare Other | Admitting: Internal Medicine

## 2017-12-28 ENCOUNTER — Ambulatory Visit (INDEPENDENT_AMBULATORY_CARE_PROVIDER_SITE_OTHER)
Admission: RE | Admit: 2017-12-28 | Discharge: 2017-12-28 | Disposition: A | Discharge status: Home / Self Care | Payer: Medicare Other | Source: Ambulatory Visit | Attending: Internal Medicine | Admitting: Internal Medicine

## 2017-12-28 ENCOUNTER — Inpatient Hospital Stay: Payer: Medicare Other | Attending: Internal Medicine

## 2017-12-28 VITALS — BP 136/70 | HR 69 | Temp 97.9°F | Resp 16 | Ht 65.0 in | Wt 184.5 lb

## 2017-12-28 DIAGNOSIS — Z923 Personal history of irradiation: Secondary | ICD-10-CM | POA: Diagnosis not present

## 2017-12-28 DIAGNOSIS — R109 Unspecified abdominal pain: Secondary | ICD-10-CM | POA: Diagnosis not present

## 2017-12-28 DIAGNOSIS — I10 Essential (primary) hypertension: Secondary | ICD-10-CM | POA: Insufficient documentation

## 2017-12-28 DIAGNOSIS — C3431 Malignant neoplasm of lower lobe, right bronchus or lung: Secondary | ICD-10-CM | POA: Diagnosis not present

## 2017-12-28 DIAGNOSIS — Z9221 Personal history of antineoplastic chemotherapy: Secondary | ICD-10-CM | POA: Insufficient documentation

## 2017-12-28 DIAGNOSIS — I4892 Unspecified atrial flutter: Secondary | ICD-10-CM

## 2017-12-28 DIAGNOSIS — C3490 Malignant neoplasm of unspecified part of unspecified bronchus or lung: Secondary | ICD-10-CM

## 2017-12-28 DIAGNOSIS — E119 Type 2 diabetes mellitus without complications: Secondary | ICD-10-CM | POA: Insufficient documentation

## 2017-12-28 DIAGNOSIS — I4891 Unspecified atrial fibrillation: Secondary | ICD-10-CM | POA: Insufficient documentation

## 2017-12-28 DIAGNOSIS — R600 Localized edema: Secondary | ICD-10-CM | POA: Diagnosis not present

## 2017-12-28 DIAGNOSIS — Z79899 Other long term (current) drug therapy: Secondary | ICD-10-CM | POA: Diagnosis not present

## 2017-12-28 DIAGNOSIS — Z7984 Long term (current) use of oral hypoglycemic drugs: Secondary | ICD-10-CM | POA: Diagnosis not present

## 2017-12-28 DIAGNOSIS — M25551 Pain in right hip: Secondary | ICD-10-CM | POA: Diagnosis not present

## 2017-12-28 DIAGNOSIS — M25552 Pain in left hip: Secondary | ICD-10-CM | POA: Insufficient documentation

## 2017-12-28 DIAGNOSIS — M47816 Spondylosis without myelopathy or radiculopathy, lumbar region: Secondary | ICD-10-CM | POA: Insufficient documentation

## 2017-12-28 LAB — CMP (CANCER CENTER ONLY)
ALT: 19 U/L (ref 0–44)
AST: 12 U/L — ABNORMAL LOW (ref 15–41)
Albumin: 3.3 g/dL — ABNORMAL LOW (ref 3.5–5.0)
Alkaline Phosphatase: 60 U/L (ref 38–126)
Anion gap: 10 (ref 5–15)
BUN: 24 mg/dL — ABNORMAL HIGH (ref 8–23)
CO2: 23 mmol/L (ref 22–32)
Calcium: 9.1 mg/dL (ref 8.9–10.3)
Chloride: 105 mmol/L (ref 98–111)
Creatinine: 1.03 mg/dL — ABNORMAL HIGH (ref 0.44–1.00)
GFR, Est AFR Am: >60 mL/min (ref >60)
GFR, Estimated: 53 mL/min — ABNORMAL LOW (ref >60)
Glucose, Bld: 166 mg/dL — ABNORMAL HIGH (ref 70–99)
Potassium: 4.1 mmol/L (ref 3.5–5.1)
Sodium: 138 mmol/L (ref 135–145)
Total Bilirubin: 0.8 mg/dL (ref 0.3–1.2)
Total Protein: 6.7 g/dL (ref 6.5–8.1)

## 2017-12-28 LAB — CBC WITH DIFFERENTIAL (CANCER CENTER ONLY)
Abs Immature Granulocytes: 0.03 10*3/uL (ref 0.00–0.07)
Basophils Absolute: 0 10*3/uL (ref 0.0–0.1)
Basophils Relative: 1 %
Eosinophils Absolute: 0.1 10*3/uL (ref 0.0–0.5)
Eosinophils Relative: 1 %
HCT: 41.9 % (ref 36.0–46.0)
Hemoglobin: 14 g/dL (ref 12.0–15.0)
Immature Granulocytes: 1 %
Lymphocytes Relative: 11 %
Lymphs Abs: 0.8 10*3/uL (ref 0.7–4.0)
MCH: 31.5 pg (ref 26.0–34.0)
MCHC: 33.4 g/dL (ref 30.0–36.0)
MCV: 94.4 fL (ref 80.0–100.0)
Monocytes Absolute: 0.6 10*3/uL (ref 0.1–1.0)
Monocytes Relative: 10 %
Neutro Abs: 5.1 10*3/uL (ref 1.7–7.7)
Neutrophils Relative %: 76 %
Platelet Count: 205 10*3/uL (ref 150–400)
RBC: 4.44 MIL/uL (ref 3.87–5.11)
RDW: 13.5 % (ref 11.5–15.5)
WBC Count: 6.6 10*3/uL (ref 4.0–10.5)
nRBC: 0 % (ref 0.0–0.2)

## 2017-12-28 LAB — URINALYSIS, ROUTINE W REFLEX MICROSCOPIC
Bilirubin Urine: NEGATIVE
Hgb urine dipstick: NEGATIVE
Ketones, ur: NEGATIVE
Leukocytes, UA: NEGATIVE
Nitrite: NEGATIVE
Specific Gravity, Urine: >=1.03 — AB (ref 1.000–1.030)
Urine Glucose: NEGATIVE
Urobilinogen, UA: 0.2 (ref 0.0–1.0)
pH: 5.5 (ref 5.0–8.0)

## 2017-12-28 NOTE — Progress Notes (Signed)
Subjective:  Patient ID: Nicole [name], female    DOB: Aug 22, 1940  Age: 77 y.o. MRN: [id number]  CC: Hypertension; Diabetes; and Atrial Fibrillation   HPI AURIANA SCALIA presents for f/up - She complains of a few recent episodes of left lower flank pain that she describes as a pulling and a spasm.  She has not taken anything for pain and does not request anything for pain.  She tells me her cough has significantly improved.  She has unchanged lower extremity edema. She denies dysuria, hematuria, nausea, vomiting, diarrhea, or constipation.  Outpatient Medications Prior to Visit  Medication Sig Dispense Refill  . benzonatate (TESSALON) 100 MG capsule TAKE 2 CAPSULES BY MOUTH THREE TIMES DAILY AS NEEDED FOR COUGH 180 capsule 0  . COLLAGEN PO Take 1 tablet by mouth daily.     [name] diltiazem (CARDIZEM CD) 360 MG 24 hr capsule Take 1 capsule (360 mg total) by mouth daily. 90 capsule 3  . glucose blood (ACCU-CHEK GUIDE) test strip 1 each by Other route daily. Use to check blood sugars daily DX code: E11.8 100 each 3  . glucose blood (COOL BLOOD GLUCOSE TEST STRIPS) test strip Use as instructed to check blood sugar twice daily. DX: E11.9 200 each 3  . irbesartan (AVAPRO) 300 MG tablet TAKE 1 TABLET BY MOUTH ONCE DAILY 90 tablet 1  . Lancets (ACCU-CHEK SOFT TOUCH) lancets Use to check blood sugars daily Dx E11.9 100 each 3  . metFORMIN (GLUCOPHAGE-XR) 500 MG 24 hr tablet Take 2 tablets (1,000 mg total) by mouth daily with breakfast. 180 tablet 3   No facility-administered medications prior to visit.     ROS Review of Systems  Constitutional: Negative.  Negative for diaphoresis, fatigue and fever.  HENT: Negative.  Negative for sore throat and trouble swallowing.   Eyes: Negative for visual disturbance.  Respiratory: Negative for cough, chest tightness, shortness of breath and wheezing.   Cardiovascular: Positive for leg swelling. Negative for chest pain and palpitations.  Gastrointestinal:  Negative for abdominal pain, constipation, diarrhea, nausea and vomiting.  Genitourinary: Positive for flank pain. Negative for difficulty urinating and dysuria.  Musculoskeletal: Negative for arthralgias and neck pain.  Hematological: Negative for adenopathy. Does not bruise/bleed easily.  Psychiatric/Behavioral: Negative.     Objective:  BP 136/70 (BP Location: Left Arm, Patient Position: Sitting, Cuff Size: Normal)   Pulse 69   Temp 97.9 F (36.6 C) (Oral)   Resp 16   Ht 5\' 5"  (1.651 m)   Wt 184 lb 8 oz (83.7 kg)   SpO2 96%   BMI 30.70 kg/m   BP Readings from Last 3 Encounters:  12/28/17 136/70  12/27/17 132/74  11/15/17 130/80    Wt Readings from Last 3 Encounters:  12/28/17 184 lb 8 oz (83.7 kg)  12/27/17 184 lb (83.5 kg)  11/15/17 182 lb (82.6 kg)    Physical Exam  Constitutional: She is oriented to person, place, and time. No distress.  HENT:  Mouth/Throat: Oropharynx is clear and moist. No oropharyngeal exudate.  Eyes: Conjunctivae are normal. No scleral icterus.  Neck: Normal range of motion. Neck supple. No JVD present. No thyromegaly present.  Cardiovascular: Normal rate, regular rhythm and normal heart sounds.  No murmur heard. Pulmonary/Chest: Effort normal and breath sounds normal. No respiratory distress. She has no wheezes. She has no rales.  Abdominal: Soft. Normal appearance and bowel sounds are normal. She exhibits no mass. There is no hepatosplenomegaly. There is no tenderness. There is  no rigidity and no CVA tenderness.  Musculoskeletal: Normal range of motion. She exhibits edema (trace pitting LE edema). She exhibits no tenderness or deformity.  Neurological: She is alert and oriented to person, place, and time.  Skin: Skin is warm and dry. She is not diaphoretic. No pallor.  Vitals reviewed.   Lab Results  Component Value Date   WBC 6.6 12/28/2017   HGB 14.0 12/28/2017   HCT 41.9 12/28/2017   PLT 205 12/28/2017   GLUCOSE 166 (H) 12/28/2017     CHOL 203 (H) 02/02/2017   TRIG 147.0 02/02/2017   HDL 61.10 02/02/2017   LDLDIRECT 74.0 06/14/2016   LDLCALC 113 (H) 02/02/2017   ALT 19 12/28/2017   AST 12 (L) 12/28/2017   NA 138 12/28/2017   K 4.1 12/28/2017   CL 105 12/28/2017   CREATININE 1.03 (H) 12/28/2017   BUN 24 (H) 12/28/2017   CO2 23 12/28/2017   TSH 0.68 11/15/2017   INR 1.0 02/21/2017   HGBA1C 6.8 (A) 10/06/2017   MICROALBUR 80 08/03/2017    Ct Chest W Contrast  Result Date: 09/29/2017 CLINICAL DATA:  Lung cancer, diagnosed 2018, chemotherapy, XRT, and immunotherapy complete. Cough, shortness of breath. EXAM: CT CHEST WITH CONTRAST TECHNIQUE: Multidetector CT imaging of the chest was performed during intravenous contrast administration. CONTRAST:  20mL OMNIPAQUE IOHEXOL 300 MG/ML  SOLN COMPARISON:  05/22/2017 FINDINGS: Cardiovascular: Heart is normal in size.  No pericardial effusion. No evidence of thoracic aortic aneurysm. Atherosclerotic calcifications of the aortic arch. Coronary atherosclerosis the LAD and right coronary artery. Mediastinum/Nodes: No suspicious mediastinal lymphadenopathy. Visualized thyroid is unremarkable. Lungs/Pleura: Radiation changes in the right paramediastinal lung and medial right lower lobe. Left lung is essentially clear. 3 mm nodule in the anterior left upper lobe (series 7/image 40), unchanged from multiple priors, benign. 4 mm nodule in the posterior right upper lobe (series 7/image 44), also unchanged, benign. No suspicious pulmonary nodules. No focal consolidation. No pleural effusion or pneumothorax. Upper Abdomen: Visualized upper abdomen is notable for prior cholecystectomy. Musculoskeletal: Degenerative changes of the visualized thoracolumbar spine. IMPRESSION: Radiation changes in the right paramediastinal lung and medial right lower lobe. No evidence of recurrent or metastatic disease. Aortic Atherosclerosis (ICD10-I70.0). Electronically Signed   By: Julian Hy M.D.   On:  09/29/2017 16:44    Ct Chest W Contrast  Result Date: 12/29/2017 CLINICAL DATA:  History of right lung cancer diagnosed in 2018 status post chemotherapy and radiation. EXAM: CT CHEST WITH CONTRAST TECHNIQUE: Multidetector CT imaging of the chest was performed during intravenous contrast administration. CONTRAST:  52mL OMNIPAQUE IOHEXOL 300 MG/ML  SOLN COMPARISON:  Chest CT 09/29/2017 FINDINGS: Cardiovascular: Normal heart size. Coronary arterial and thoracic aortic vascular calcifications. Trace fluid superior pericardial recess. Mediastinum/Nodes: No enlarged axillary, mediastinal or hilar lymphadenopathy. Small hiatal hernia. Lungs/Pleura: Central airways are patent. Small focal area of ground-glass opacity left lower lobe (image 113; series 6). Stable post radiation changes paramediastinal right lung. No pleural effusion or pneumothorax. Stable 3 mm left upper lobe nodule (image 41; series 6) compatible with benign process. Stable 4 mm posterior right upper lobe nodule (image 47; series 6). Stable 4 mm superior segment left lower lobe nodule (image 59; series 6). Upper Abdomen: No acute process. Musculoskeletal: Thoracic spine degenerative changes. No aggressive or acute appearing osseous lesions. IMPRESSION: Stable postradiation changes within the paramediastinal right lung and medial right lower lobe. No evidence for localized recurrence or metastatic disease. Aortic Atherosclerosis (ICD10-I70.0). Electronically Signed   By: Dian Situ  Rosana Hoes M.D.   On: 12/29/2017 11:52   Dg Abd Acute W/chest  Result Date: 12/29/2017 CLINICAL DATA:  Left flank pain for 2 weeks EXAM: DG ABDOMEN ACUTE W/ 1V CHEST COMPARISON:  09/29/2017 FINDINGS: Cardiac shadows within normal limits. Post radiation changes are noted along the medial right lung base stable from the previous CT examination. No acute infiltrate is noted. Scattered large and small bowel gas is noted. Mild retained fecal material is seen without obstructive change.  Degenerative change of the lumbar spine is noted. No abnormal mass or abnormal calcifications are seen. Postsurgical changes in the pelvis are noted. No free air is seen IMPRESSION: No acute abnormality noted. Electronically Signed   By: Inez Catalina M.D.   On: 12/29/2017 09:02    Assessment & Plan:   Ree was seen today for hypertension, diabetes and atrial fibrillation.  Diagnoses and all orders for this visit:  Acute left flank pain- Her symptoms are benign.  Examination of the area is unremarkable.  Plain films do not show any pathology.  Her urinalysis and urine culture are unremarkable.  A lung CT is also unremarkable.  Her discomfort is most likely musculoskeletal pain.  I offered her reassurance. -     CULTURE, URINE COMPREHENSIVE; Future -     Urinalysis, Routine w reflex microscopic; Future -     DG Abd Acute W/Chest; Future  Essential hypertension- Her blood pressure is well controlled.  Atrial flutter, unspecified type [facility])- She is maintaining sinus rhythm.   I am having Nicole [name] maintain her COLLAGEN PO, glucose blood, accu-chek soft touch, metFORMIN, irbesartan, diltiazem, glucose blood, and benzonatate.  No orders of the defined types were placed in this encounter.    Follow-up: Return in about 3 weeks (around 01/18/2018).  Scarlette Calico, MD

## 2017-12-28 NOTE — Patient Instructions (Signed)
Flank Pain, Adult Flank pain is pain that is located on the side of the body between the upper abdomen and the back. This area is called the flank. The pain may occur over a short period of time (acute), or it may be long-term or recurring (chronic). It may be mild or severe. Flank pain can be caused by many things, including:  Muscle soreness or injury.  Kidney stones or kidney disease.  Stress.  A disease of the spine (vertebral disk disease).  A lung infection (pneumonia).  Fluid around the lungs (pulmonary edema).  A skin rash caused by the chickenpox virus (shingles).  Tumors that affect the back of the abdomen.  Gallbladder disease.  Follow these instructions at home:  Drink enough fluid to keep your urine clear or pale yellow.  Rest as told by your health care provider.  Take over-the-counter and prescription medicines only as told by your health care provider.  Keep a journal to track what has caused your flank pain and what has made it feel better.  Keep all follow-up visits as told by your health care provider. This is important. Contact a health care provider if:  Your pain is not controlled with medicine.  You have new symptoms.  Your pain gets worse.  You have a fever.  Your symptoms last longer than 2-3 days.  You have trouble urinating or you are urinating very frequently. Get help right away if:  You have trouble breathing or you are short of breath.  Your abdomen hurts or it is swollen or red.  You have nausea or vomiting.  You feel faint or you pass out.  You have blood in your urine. Summary  Flank pain is pain that is located on the side of the body between the upper abdomen and the back.  The pain may occur over a short period of time (acute), or it may be long-term or recurring (chronic). It may be mild or severe.  Flank pain can be caused by many things.  Contact your health care provider if your symptoms get worse or they last  longer than 2-3 days. This information is not intended to replace advice given to you by your health care provider. Make sure you discuss any questions you have with your health care provider. Document Released: 03/03/2005 Document Revised: 03/25/2016 Document Reviewed: 03/25/2016 Elsevier Interactive Patient Education  2018 [employer]  

## 2017-12-29 ENCOUNTER — Telehealth: Payer: Self-pay | Admitting: *Deleted

## 2017-12-29 ENCOUNTER — Encounter (HOSPITAL_COMMUNITY): Payer: Self-pay

## 2017-12-29 ENCOUNTER — Encounter: Payer: Self-pay | Admitting: Internal Medicine

## 2017-12-29 ENCOUNTER — Ambulatory Visit (HOSPITAL_COMMUNITY)
Admission: RE | Admit: 2017-12-29 | Discharge: 2017-12-29 | Disposition: A | Discharge status: Home / Self Care | Payer: Medicare Other | Source: Ambulatory Visit | Attending: Internal Medicine | Admitting: Internal Medicine

## 2017-12-29 ENCOUNTER — Inpatient Hospital Stay: Payer: Medicare Other

## 2017-12-29 ENCOUNTER — Telehealth: Payer: Self-pay

## 2017-12-29 ENCOUNTER — Telehealth: Payer: Self-pay | Admitting: Internal Medicine

## 2017-12-29 DIAGNOSIS — C3490 Malignant neoplasm of unspecified part of unspecified bronchus or lung: Secondary | ICD-10-CM | POA: Diagnosis not present

## 2017-12-29 DIAGNOSIS — K449 Diaphragmatic hernia without obstruction or gangrene: Secondary | ICD-10-CM | POA: Diagnosis not present

## 2017-12-29 LAB — CULTURE, URINE COMPREHENSIVE
MICRO NUMBER:: 91458013
SPECIMEN QUALITY:: ADEQUATE

## 2017-12-29 MED ORDER — SODIUM CHLORIDE (PF) 0.9 % IJ SOLN
INTRAMUSCULAR | Status: AC
  Filled 2017-12-29: qty 50

## 2017-12-29 MED ORDER — IOHEXOL 300 MG/ML  SOLN
75.0000 mL | Freq: Once | INTRAMUSCULAR | Status: AC | PRN
  Administered 2017-12-29: 75 mL via INTRAVENOUS

## 2017-12-29 NOTE — Telephone Encounter (Signed)
Per 12/6 walk in lab canceled due to patient got labs at her [facility] office on 12/5 and CT on 12/6.

## 2017-12-29 NOTE — Telephone Encounter (Signed)
R/s appt per 12/6 sch message - left message for patient with appt change

## 2017-12-29 NOTE — Telephone Encounter (Signed)
Notified pt MD appt 12/23 moved to 12/13 at 0930 to discuss CT results. Pt verbalized understanding no further concerns.

## 2018-01-04 ENCOUNTER — Ambulatory Visit: Payer: Medicare Other | Admitting: Internal Medicine

## 2018-01-05 ENCOUNTER — Inpatient Hospital Stay (HOSPITAL_BASED_OUTPATIENT_CLINIC_OR_DEPARTMENT_OTHER): Payer: Medicare Other | Admitting: Oncology

## 2018-01-05 ENCOUNTER — Telehealth: Payer: Self-pay | Admitting: Internal Medicine

## 2018-01-05 ENCOUNTER — Other Ambulatory Visit: Payer: Self-pay | Admitting: Oncology

## 2018-01-05 VITALS — BP 135/68 | HR 103 | Temp 98.2°F | Resp 18 | Ht 65.0 in | Wt 189.3 lb

## 2018-01-05 DIAGNOSIS — Z7984 Long term (current) use of oral hypoglycemic drugs: Secondary | ICD-10-CM | POA: Diagnosis not present

## 2018-01-05 DIAGNOSIS — C3431 Malignant neoplasm of lower lobe, right bronchus or lung: Secondary | ICD-10-CM | POA: Diagnosis not present

## 2018-01-05 DIAGNOSIS — Z923 Personal history of irradiation: Secondary | ICD-10-CM

## 2018-01-05 DIAGNOSIS — J329 Chronic sinusitis, unspecified: Secondary | ICD-10-CM

## 2018-01-05 DIAGNOSIS — Z79899 Other long term (current) drug therapy: Secondary | ICD-10-CM

## 2018-01-05 DIAGNOSIS — I10 Essential (primary) hypertension: Secondary | ICD-10-CM | POA: Diagnosis not present

## 2018-01-05 DIAGNOSIS — I4891 Unspecified atrial fibrillation: Secondary | ICD-10-CM | POA: Diagnosis not present

## 2018-01-05 DIAGNOSIS — M25552 Pain in left hip: Secondary | ICD-10-CM | POA: Diagnosis not present

## 2018-01-05 DIAGNOSIS — Z9221 Personal history of antineoplastic chemotherapy: Secondary | ICD-10-CM | POA: Diagnosis not present

## 2018-01-05 DIAGNOSIS — R600 Localized edema: Secondary | ICD-10-CM

## 2018-01-05 DIAGNOSIS — M25551 Pain in right hip: Secondary | ICD-10-CM | POA: Diagnosis not present

## 2018-01-05 DIAGNOSIS — E119 Type 2 diabetes mellitus without complications: Secondary | ICD-10-CM

## 2018-01-05 DIAGNOSIS — R609 Edema, unspecified: Secondary | ICD-10-CM

## 2018-01-05 DIAGNOSIS — M47816 Spondylosis without myelopathy or radiculopathy, lumbar region: Secondary | ICD-10-CM | POA: Diagnosis not present

## 2018-01-05 DIAGNOSIS — C3491 Malignant neoplasm of unspecified part of right bronchus or lung: Secondary | ICD-10-CM

## 2018-01-05 DIAGNOSIS — G893 Neoplasm related pain (acute) (chronic): Secondary | ICD-10-CM

## 2018-01-05 MED ORDER — FUROSEMIDE 20 MG PO TABS: 20.0000 mg | ORAL_TABLET | Freq: Every day | ORAL | 0 refills | Status: DC | PRN

## 2018-01-05 MED ORDER — AZITHROMYCIN 250 MG PO TABS: ORAL_TABLET | ORAL | 0 refills | Status: DC

## 2018-01-05 NOTE — Assessment & Plan Note (Addendum)
This is a very pleasant 77 year old Asian female recently diagnosed with a stage IIIa non-small cell4 lung cancer, adenocarcinoma. She underwent a course of concurrent chemoradiation with weekly carboplatin and paclitaxel status post 7 cycles. The patient tolerated the previous course fairly well except for mild odynophagia and dry cough. The patient was a started on consolidation treatment with immunotherapy with Imfinzi (Durvalumab) status post 5 cycles.  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 She was treated with a taper dose of prednisone and she felt much better. She has been in observation for more than 1 year.  She had a restaging CT scan of the chest and is here to discuss the results.  The patient was seen with Dr. Julien Nordmann.  CT scan results were discussed with the patient and her family member which showed no concerning findings of disease recurrence.  Recommend for her to continue on observation.  She will have a restaging CT scan of the chest in approximately 4 months.  For the lower extremity edema, she was given a prescription for Lasix 20 mg daily as needed.  She was advised to increase the potassium in her diet.  She may need further follow-up with her primary care provider or her cardiologist for management of her lower extremity edema.  For the sinusitis, she was given a prescription for a Z-Pak.  She will let us know if her symptoms worsen or do not improve.   She was advised to call immediately if she has any concerning symptoms in the interval. The patient voices understanding of current disease status and treatment options and is in agreement with the current care plan. All questions were answered. The patient knows to call the clinic with any problems, questions or concerns. We can certainly see the patient much sooner if necessary.

## 2018-01-05 NOTE — Telephone Encounter (Signed)
Printed calendar and avs. °

## 2018-01-05 NOTE — Progress Notes (Signed)
[facility] OFFICE PROGRESS NOTE  Nicole [name], [facility] [address] [place] [address]  DIAGNOSIS: Stage IIIa (T1b, N2, M0) non-small cell lung cancer, adenocarcinoma presented with right lower lobe lung mass in addition to right hilar and subcarinal lymphadenopathy diagnosed in June 2018.  Biomarker Findings Tumor Mutational Burden - TMB-Intermediate (8 Muts/Mb) Microsatellite Status - Cannot Be Determined Genomic Findings For a complete list of the genes assayed, please refer to the Appendix. EGFR E709K, G719S ZHG9JM E268T MHD6Q2 splice site 297-9G>X 7 Disease relevant genes with no reportable alterations: KRAS, ALK, BRAF, MET, ERBB2, RET, ROS1  PDL1 Expression 70%.  PRIOR THERAPY: 1) Concurrent chemoradiation with weekly carboplatin for AUC of 2 and paclitaxel 45 MG/M2. Status post 7 cycles with partial response. 2) Consolidation treatment with immunotherapy with Imfinzi (Durvalumab) 10 MG/KG every 2 weeks, first dose 10/19/2016.  Status post 5 cycles.  Discontinued secondary to intolerance and persistent pneumonitis.  CURRENT THERAPY: Observation.  INTERVAL HISTORY: Nicole [name] 77 y.o. female returns for routine follow-up visit accompanied by her family member.  The patient is feeling fine today and has no specific complaints except for lower extremity edema and facial pain with sinus congestion.  She denies fevers and chills.  Denies chest pain, shortness of breath, hemoptysis.  She has an intermittent cough with yellow sputum production.  Denies nausea, vomiting, constipation, diarrhea.  Denies recent weight loss or night sweats.  Patient reports that she has not tried any medication for her lower extremity edema.  She does try to elevate her legs at the end of the day and limit her sodium intake but reports it is difficult to do so.  The patient is here for evaluation and discussion of her recent CT scan of the chest.  MEDICAL HISTORY: Past  Medical History:  Diagnosis Date  . A-fib ([place])   . Adenocarcinoma of right lung, stage 3 ([place]) 07/28/2016  . Atrial flutter ([place]) 10/05/2016  . Bronchitis    hx of  . Cancer [facility]) 2004   uterine/cervical  . Cancer-related pain 10/06/2016  . Coronary artery disease 02/21/2017   JAN 2019 Prox RCA lesion is 25% stenosed. Prox Cx lesion is 30% stenosed. Mid Cx lesion is 25% stenosed. Ost 3rd Mrg lesion is 30% stenosed. Prox LAD lesion is 25% stenosed. Mid LAD lesion is 25% stenosed. The left ventricular systolic function is normal. LV end diastolic pressure is normal. The left ventricular ejection fraction is 55-65% by visual estimate. There is no aortic valve stenosis.   N  . Diabetes mellitus    type 2  . Edema 07/12/2017  . Essential hypertension 01/20/2009  . Gallstones   . Headache   . History of radiation therapy 08/09/2016 to 09/19/2016   Right lung was treated to 60 Gy in 30 fractions at 2 Gy per fraction  . Hypertension   . Hypertriglyceridemia 08/22/2012  . Low back pain   . Lung mass    with lymphadenopathy  . Osteoarthritis   . Type II diabetes mellitus with manifestations ([place]) 01/20/2009   Estimated Creatinine Clearance: 49.8 mL/min (by C-G formula based on SCr of 1 mg/dL).    ALLERGIES:  is allergic to no known allergies.  MEDICATIONS:  Current Outpatient Medications  Medication Sig Dispense Refill  . benzonatate (TESSALON) 100 MG capsule TAKE 2 CAPSULES BY MOUTH THREE TIMES DAILY AS NEEDED FOR COUGH 180 capsule 0  . COLLAGEN PO Take 1 tablet by mouth daily.     [name]  diltiazem (CARDIZEM CD) 360 MG 24 hr capsule Take 1 capsule (360 mg total) by mouth daily. 90 capsule 3  . glucose blood (ACCU-CHEK GUIDE) test strip 1 each by Other route daily. Use to check blood sugars daily DX code: E11.8 100 each 3  . glucose blood (COOL BLOOD GLUCOSE TEST STRIPS) test strip Use as instructed to check blood sugar twice daily. DX: E11.9 200 each 3  . irbesartan (AVAPRO) 300 MG tablet TAKE 1  TABLET BY MOUTH ONCE DAILY 90 tablet 1  . Lancets (ACCU-CHEK SOFT TOUCH) lancets Use to check blood sugars daily Dx E11.9 100 each 3  . metFORMIN (GLUCOPHAGE-XR) 500 MG 24 hr tablet Take 2 tablets (1,000 mg total) by mouth daily with breakfast. 180 tablet 3  . azithromycin (ZITHROMAX Z-PAK) 250 MG tablet Take 2 tablets on day 1 and then 1 tablet daily until complete. 6 each 0  . furosemide (LASIX) 20 MG tablet Take 1 tablet (20 mg total) by mouth daily as needed (For swelling). 30 tablet 0   No current facility-administered medications for this visit.     SURGICAL HISTORY:  Past Surgical History:  Procedure Laterality Date  . ABDOMINAL HYSTERECTOMY  2004  . APPENDECTOMY  when 77 years old  . COLONOSCOPY  08-05-09   [name]  . INCONTINENCE SURGERY    . KNEE ARTHROSCOPY Left   . LEFT HEART CATH AND CORONARY ANGIOGRAPHY N/A 02/24/2017   Procedure: LEFT HEART CATH AND CORONARY ANGIOGRAPHY;  Surgeon: Jettie Booze, MD;  Location: [facility];  Service: Cardiovascular;  Laterality: N/A;  . LEFT HEART CATHETERIZATION WITH CORONARY ANGIOGRAM N/A 07/19/2013   Procedure: LEFT HEART CATHETERIZATION WITH CORONARY ANGIOGRAM;  Surgeon: Sinclair Grooms, MD;  Location: [facility] CATH LAB;  Service: Cardiovascular;  Laterality: N/A;  . POLYPECTOMY  08-05-09   2 polyps  . TOTAL KNEE ARTHROPLASTY Left 03/28/2012   Procedure: LEFT TOTAL KNEE ARTHROPLASTY;  Surgeon: Tobi Bastos, MD;  Location: [facility];  Service: Orthopedics;  Laterality: Left;  . TUBAL LIGATION    . VIDEO BRONCHOSCOPY WITH ENDOBRONCHIAL ULTRASOUND N/A 07/11/2016   Procedure: VIDEO BRONCHOSCOPY WITH ENDOBRONCHIAL ULTRASOUND;  Surgeon: Marshell Garfinkel, MD;  Location: [facility];  Service: Pulmonary;  Laterality: N/A;    REVIEW OF SYSTEMS:   Review of Systems  Constitutional: Negative for appetite change, chills, fatigue, fever and unexpected weight change.  HENT:   Negative for mouth sores, nosebleeds, sore throat and trouble  swallowing.   Eyes: Negative for eye problems and icterus.  Respiratory: Negative for hemoptysis, shortness of breath and wheezing.  Positive for productive cough. Cardiovascular: Negative for chest pain.  Positive for lower extremity edema. Gastrointestinal: Negative for abdominal pain, constipation, diarrhea, nausea and vomiting.  Genitourinary: Negative for bladder incontinence, difficulty urinating, dysuria, frequency and hematuria.   Musculoskeletal: Negative for back pain, gait problem, neck pain and neck stiffness.  Skin: Negative for itching and rash.  Neurological: Negative for dizziness, extremity weakness, gait problem, headaches, light-headedness and seizures.  Hematological: Negative for adenopathy. Does not bruise/bleed easily.  Psychiatric/Behavioral: Negative for confusion, depression and sleep disturbance. The patient is not nervous/anxious.     PHYSICAL EXAMINATION:  Blood pressure 135/68, pulse (!) 103, temperature 98.2 F (36.8 C), temperature source Oral, resp. rate 18, height '5\' 5"'$  (1.651 m), weight 189 lb 4.8 oz (85.9 kg), SpO2 98 %.  ECOG PERFORMANCE STATUS: 1 - Symptomatic but completely ambulatory  Physical Exam  Constitutional: Oriented to person, place, and time and well-developed,  well-nourished, and in no distress. No distress.  HENT:  Head: Normocephalic and atraumatic.  Mouth/Throat: Oropharynx is clear and moist. No oropharyngeal exudate.  Eyes: Conjunctivae are normal. Right eye exhibits no discharge. Left eye exhibits no discharge. No scleral icterus.  Neck: Normal range of motion. Neck supple.  Cardiovascular: Normal rate, regular rhythm, normal heart sounds and intact distal pulses.  1-2+ bilateral lower extremity edema. Pulmonary/Chest: Effort normal and breath sounds normal. No respiratory distress. No wheezes. No rales.   Abdominal: Soft. Bowel sounds are normal. Exhibits no distension and no mass. There is no tenderness.  Musculoskeletal: Normal  range of motion.  Lymphadenopathy:    No cervical adenopathy.  Neurological: Alert and oriented to person, place, and time. Exhibits normal muscle tone. Gait normal. Coordination normal.  Skin: Skin is warm and dry. No rash noted. Not diaphoretic. No erythema. No pallor.  Psychiatric: Mood, memory and judgment normal.  Vitals reviewed.  LABORATORY DATA: Lab Results  Component Value Date   WBC 6.6 12/28/2017   HGB 14.0 12/28/2017   HCT 41.9 12/28/2017   MCV 94.4 12/28/2017   PLT 205 12/28/2017      Chemistry      Component Value Date/Time   NA 138 12/28/2017 1033   NA 140 02/21/2017 1238   NA 139 01/26/2017 0915   K 4.1 12/28/2017 1033   K 4.1 01/26/2017 0915   CL 105 12/28/2017 1033   CO2 23 12/28/2017 1033   CO2 24 01/26/2017 0915   BUN 24 (H) 12/28/2017 1033   BUN 31 (H) 02/21/2017 1238   BUN 22.0 01/26/2017 0915   CREATININE 1.03 (H) 12/28/2017 1033   CREATININE 0.9 01/26/2017 0915      Component Value Date/Time   CALCIUM 9.1 12/28/2017 1033   CALCIUM 9.2 01/26/2017 0915   ALKPHOS 60 12/28/2017 1033   ALKPHOS 72 01/26/2017 0915   AST 12 (L) 12/28/2017 1033   AST 13 01/26/2017 0915   ALT 19 12/28/2017 1033   ALT 12 01/26/2017 0915   BILITOT 0.8 12/28/2017 1033   BILITOT 0.37 01/26/2017 0915       RADIOGRAPHIC STUDIES:  Ct Chest W Contrast  Result Date: 12/29/2017 CLINICAL DATA:  History of right lung cancer diagnosed in 2018 status post chemotherapy and radiation. EXAM: CT CHEST WITH CONTRAST TECHNIQUE: Multidetector CT imaging of the chest was performed during intravenous contrast administration. CONTRAST:  74m OMNIPAQUE IOHEXOL 300 MG/ML  SOLN COMPARISON:  Chest CT 09/29/2017 FINDINGS: Cardiovascular: Normal heart size. Coronary arterial and thoracic aortic vascular calcifications. Trace fluid superior pericardial recess. Mediastinum/Nodes: No enlarged axillary, mediastinal or hilar lymphadenopathy. Small hiatal hernia. Lungs/Pleura: Central airways are  patent. Small focal area of ground-glass opacity left lower lobe (image 113; series 6). Stable post radiation changes paramediastinal right lung. No pleural effusion or pneumothorax. Stable 3 mm left upper lobe nodule (image 41; series 6) compatible with benign process. Stable 4 mm posterior right upper lobe nodule (image 47; series 6). Stable 4 mm superior segment left lower lobe nodule (image 59; series 6). Upper Abdomen: No acute process. Musculoskeletal: Thoracic spine degenerative changes. No aggressive or acute appearing osseous lesions. IMPRESSION: Stable postradiation changes within the paramediastinal right lung and medial right lower lobe. No evidence for localized recurrence or metastatic disease. Aortic Atherosclerosis (ICD10-I70.0). Electronically Signed   By: DLovey NewcomerM.D.   On: 12/29/2017 11:52   Dg Abd Acute W/chest  Result Date: 12/29/2017 CLINICAL DATA:  Left flank pain for 2 weeks EXAM: DG ABDOMEN  ACUTE W/ 1V CHEST COMPARISON:  09/29/2017 FINDINGS: Cardiac shadows within normal limits. Post radiation changes are noted along the medial right lung base stable from the previous CT examination. No acute infiltrate is noted. Scattered large and small bowel gas is noted. Mild retained fecal material is seen without obstructive change. Degenerative change of the lumbar spine is noted. No abnormal mass or abnormal calcifications are seen. Postsurgical changes in the pelvis are noted. No free air is seen IMPRESSION: No acute abnormality noted. Electronically Signed   By: Inez Catalina M.D.   On: 12/29/2017 09:02     ASSESSMENT/PLAN:  Adenocarcinoma of right lung, stage 3 ([place]) This is a very pleasant 77 year old Asian female recently diagnosed with a stage IIIa non-small cell4 lung cancer, adenocarcinoma. She underwent a course of concurrent chemoradiation with weekly carboplatin and paclitaxel status post 7 cycles. The patient tolerated the previous course fairly well except for mild  odynophagia and dry cough. The patient was a started on consolidation treatment with immunotherapy with Imfinzi (Durvalumab) status post 5 cycles.  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 She was treated with a taper dose of prednisone and she felt much better. She has been in observation for more than 1 year.  She had a restaging CT scan of the chest and is here to discuss the results.  The patient was seen with Dr. Julien Nordmann.  CT scan results were discussed with the patient and her family member which showed no concerning findings of disease recurrence.  Recommend for her to continue on observation.  She will have a restaging CT scan of the chest in approximately 4 months.  For the lower extremity edema, she was given a prescription for Lasix 20 mg daily as needed.  She was advised to increase the potassium in her diet.  She may need further follow-up with her primary care provider or her cardiologist for management of her lower extremity edema.  For the sinusitis, she was given a prescription for a Z-Pak.  She will let us know if her symptoms worsen or do not improve.   She was advised to call immediately if she has any concerning symptoms in the interval. The patient voices understanding of current disease status and treatment options and is in agreement with the current care plan. All questions were answered. The patient knows to call the clinic with any problems, questions or concerns. We can certainly see the patient much sooner if necessary.   Orders Placed This Encounter  Procedures  . CT CHEST W CONTRAST    Standing Status:   Future    Standing Expiration Date:   01/06/2019    Order Specific Question:   If indicated for the ordered procedure, I authorize the administration of contrast media per Radiology protocol    Answer:   Yes    Order Specific Question:   Preferred imaging location?    Answer:   [facility]    Order Specific Question:   Radiology  Contrast Protocol - do NOT remove file path    Answer:   [email or web address]    Order Specific Question:   ** REASON FOR EXAM (FREE TEXT)    Answer:   Lung cancer. Restaging.  [name] CBC with Differential (Cancer Center Only)    Standing Status:   Future    Standing Expiration Date:   01/06/2019  . CMP ([facility] only)    Standing Status:   Future    Standing Expiration Date:  01/06/2019     Mikey Bussing, DNP, AGPCNP-BC, AOCNP 01/05/18   ADDENDUM: Hematology/Oncology Attending: I had a face-to-face encounter with the patient.  I recommended her care plan.  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 The patient is feeling fine today with no concerning complaints except for postnasal drainage and sinus infection as well as the swelling of the lower extremities.  She is followed by cardiology and her primary care physician but the patient was not giving any diuretics in the past. She had repeat CT scan of the chest performed recently.  I personally and independently reviewed the scan images and discussed the results with the patient and her daughter-in-law today. Her scan showed no concerning findings for disease progression. I recommended for the patient to continue on observation with repeat CT scan of the chest in 4 months. For the lower extremity edema, we will start the patient on Lasix 20 mg p.o. daily as needed for the swelling.  She will need to discuss with her primary care physician continuation of this treatment.  She was also advised to increase her potassium intake during this treatment. For the sinus  drainage, we started the patient on Z-Pak today. The patient was advised to call immediately if she has any concerning symptoms in the interval.  Disclaimer: This note was dictated with voice recognition software. Similar sounding words can inadvertently be transcribed and may be missed upon review. Eilleen Kempf, MD 01/06/18

## 2018-01-06 ENCOUNTER — Encounter: Payer: Self-pay | Admitting: Oncology

## 2018-01-08 ENCOUNTER — Other Ambulatory Visit: Payer: Self-pay | Admitting: *Deleted

## 2018-01-08 ENCOUNTER — Telehealth: Payer: Self-pay | Admitting: *Deleted

## 2018-01-08 ENCOUNTER — Encounter (HOSPITAL_COMMUNITY): Payer: Self-pay

## 2018-01-08 ENCOUNTER — Ambulatory Visit (HOSPITAL_COMMUNITY)
Admission: RE | Admit: 2018-01-08 | Discharge: 2018-01-08 | Disposition: A | Discharge status: Home / Self Care | Payer: Medicare Other | Source: Ambulatory Visit | Attending: Medical | Admitting: Medical

## 2018-01-08 ENCOUNTER — Inpatient Hospital Stay (HOSPITAL_BASED_OUTPATIENT_CLINIC_OR_DEPARTMENT_OTHER): Payer: Medicare Other | Admitting: Medical

## 2018-01-08 ENCOUNTER — Other Ambulatory Visit: Payer: Self-pay | Admitting: Medical

## 2018-01-08 ENCOUNTER — Ambulatory Visit (HOSPITAL_COMMUNITY): Admission: RE | Admit: 2018-01-08 | Payer: Medicare Other | Source: Ambulatory Visit

## 2018-01-08 ENCOUNTER — Inpatient Hospital Stay: Payer: Medicare Other

## 2018-01-08 VITALS — BP 114/66 | HR 77 | Temp 97.9°F | Resp 20 | Ht 65.0 in | Wt 188.5 lb

## 2018-01-08 DIAGNOSIS — M25552 Pain in left hip: Secondary | ICD-10-CM | POA: Diagnosis not present

## 2018-01-08 DIAGNOSIS — M25551 Pain in right hip: Secondary | ICD-10-CM

## 2018-01-08 DIAGNOSIS — E119 Type 2 diabetes mellitus without complications: Secondary | ICD-10-CM

## 2018-01-08 DIAGNOSIS — R600 Localized edema: Secondary | ICD-10-CM | POA: Diagnosis not present

## 2018-01-08 DIAGNOSIS — I4891 Unspecified atrial fibrillation: Secondary | ICD-10-CM | POA: Diagnosis not present

## 2018-01-08 DIAGNOSIS — C3491 Malignant neoplasm of unspecified part of right bronchus or lung: Secondary | ICD-10-CM

## 2018-01-08 DIAGNOSIS — Z79899 Other long term (current) drug therapy: Secondary | ICD-10-CM | POA: Diagnosis not present

## 2018-01-08 DIAGNOSIS — R05 Cough: Secondary | ICD-10-CM

## 2018-01-08 DIAGNOSIS — Z7984 Long term (current) use of oral hypoglycemic drugs: Secondary | ICD-10-CM | POA: Diagnosis not present

## 2018-01-08 DIAGNOSIS — Z923 Personal history of irradiation: Secondary | ICD-10-CM | POA: Diagnosis not present

## 2018-01-08 DIAGNOSIS — Z9221 Personal history of antineoplastic chemotherapy: Secondary | ICD-10-CM | POA: Diagnosis not present

## 2018-01-08 DIAGNOSIS — I10 Essential (primary) hypertension: Secondary | ICD-10-CM | POA: Diagnosis not present

## 2018-01-08 DIAGNOSIS — M47816 Spondylosis without myelopathy or radiculopathy, lumbar region: Secondary | ICD-10-CM | POA: Diagnosis not present

## 2018-01-08 DIAGNOSIS — C3431 Malignant neoplasm of lower lobe, right bronchus or lung: Secondary | ICD-10-CM

## 2018-01-08 DIAGNOSIS — R059 Cough, unspecified: Secondary | ICD-10-CM

## 2018-01-08 LAB — CBC WITH DIFFERENTIAL (CANCER CENTER ONLY)
Abs Immature Granulocytes: 0.02 10*3/uL (ref 0.00–0.07)
Basophils Absolute: 0 10*3/uL (ref 0.0–0.1)
Basophils Relative: 0 %
Eosinophils Absolute: 0.1 10*3/uL (ref 0.0–0.5)
Eosinophils Relative: 2 %
HCT: 38.4 % (ref 36.0–46.0)
Hemoglobin: 12.7 g/dL (ref 12.0–15.0)
Immature Granulocytes: 0 %
Lymphocytes Relative: 15 %
Lymphs Abs: 0.8 10*3/uL (ref 0.7–4.0)
MCH: 31.2 pg (ref 26.0–34.0)
MCHC: 33.1 g/dL (ref 30.0–36.0)
MCV: 94.3 fL (ref 80.0–100.0)
Monocytes Absolute: 0.5 10*3/uL (ref 0.1–1.0)
Monocytes Relative: 10 %
Neutro Abs: 3.9 10*3/uL (ref 1.7–7.7)
Neutrophils Relative %: 73 %
Platelet Count: 214 10*3/uL (ref 150–400)
RBC: 4.07 MIL/uL (ref 3.87–5.11)
RDW: 13.1 % (ref 11.5–15.5)
WBC Count: 5.4 10*3/uL (ref 4.0–10.5)
nRBC: 0 % (ref 0.0–0.2)

## 2018-01-08 LAB — CMP (CANCER CENTER ONLY)
ALT: 17 U/L (ref 0–44)
AST: 13 U/L — ABNORMAL LOW (ref 15–41)
Albumin: 2.9 g/dL — ABNORMAL LOW (ref 3.5–5.0)
Alkaline Phosphatase: 66 U/L (ref 38–126)
Anion gap: 10 (ref 5–15)
BUN: 23 mg/dL (ref 8–23)
CO2: 26 mmol/L (ref 22–32)
Calcium: 9.2 mg/dL (ref 8.9–10.3)
Chloride: 103 mmol/L (ref 98–111)
Creatinine: 1.14 mg/dL — ABNORMAL HIGH (ref 0.44–1.00)
GFR, Est AFR Am: 54 mL/min — ABNORMAL LOW (ref >60)
GFR, Estimated: 47 mL/min — ABNORMAL LOW (ref >60)
Glucose, Bld: 293 mg/dL — ABNORMAL HIGH (ref 70–99)
Potassium: 3.7 mmol/L (ref 3.5–5.1)
Sodium: 139 mmol/L (ref 135–145)
Total Bilirubin: 0.6 mg/dL (ref 0.3–1.2)
Total Protein: 6.6 g/dL (ref 6.5–8.1)

## 2018-01-08 MED ORDER — GUAIFENESIN-CODEINE 100-10 MG/5ML PO SOLN: 5.0000 mL | Freq: Four times a day (QID) | ORAL | 0 refills | Status: DC | PRN

## 2018-01-08 NOTE — Telephone Encounter (Signed)
Patient called regarding increasing cough and new onset pain down her legs.  Patient states she thinks she may have pulled or strained muscle from coughing which is contributing from her new onset pain in legs which is causing her to be unable to put much weight through her legs.    Patient was seen by Mikey Bussing, NP and was given Lasix for leg edema which she reports has improved.  She was also prescribed antibiotic for sinusitis.  Per Mikey Bussing, NP patient should be seen by [facility]. Patient agreeable to plan to be seen by Sandi Mealy, PA in symptom mgmt.    [facility] scheduling message sent.

## 2018-01-08 NOTE — Patient Instructions (Signed)
X-rays X-rays are tests that create pictures of the inside of your body using radiation. Different body parts absorb different amounts of radiation, which show up on the X-ray pictures in shades of black, gray, and white. X-rays are used to look for many health conditions, including broken bones, lung problems, and some types of cancer. Tell a health care provider about:  Any allergies you have.  All medicines you are taking, including vitamins, herbs, eye drops, creams, and over-the-counter medicines.  Previous surgeries you have had.  Medical conditions you have. What are the risks? Getting an X-ray is a safe procedure. What happens before the procedure?  Tell the X-ray technician if you are pregnant or might be pregnant.  You may be asked to wear a protective lead apron to hide parts of your body from the X-ray.  You usually will need to undress whatever part of your body needs the X-ray. You will be given a hospital gown to wear, if needed.  You may need to remove your glasses, jewelry, and other metal objects. What happens during the procedure?  The X-ray machine creates a picture by using a tiny burst of radiation. It is painless.  You may need to have several pictures taken at different angles.  You will need to try to be as still as you can during the examination to get the best possible images. What happens after the procedure?  You will be able to resume your normal activities.  The X-ray will be examined by your health care provider or a radiology specialist.  It is your responsibility to get your test results. Ask your health care provider when to expect your results and how to get them. This information is not intended to replace advice given to you by your health care provider. Make sure you discuss any questions you have with your health care provider. Document Released: 01/10/2005 Document Revised: 09/10/2015 Document Reviewed: 03/06/2013 Elsevier Interactive  Patient Education  [employer].

## 2018-01-10 ENCOUNTER — Telehealth: Payer: Self-pay | Admitting: Medical Oncology

## 2018-01-10 NOTE — Telephone Encounter (Signed)
-----   Message from [employer]. Tanner, PA-C sent at 01/10/2018 11:16 AM EST ----- Regarding: Orthopedic Referral I made a referral to Dr. Dione Plover. [place] at [place].  She can call [phone] and make an appointment since I have already entered a referral if she would like.

## 2018-01-10 NOTE — Telephone Encounter (Signed)
Asking about referral to ?orthopedist.She cannot bear wt on legs due to pain, ?spasms -using wheelchair.

## 2018-01-10 NOTE — Progress Notes (Signed)
[facility] Progress Note   Nicole [name] [id number] 01-24-1941 77 y.o.  Nicole [name] is managed by Dr. Julien [name]  Actively treated with chemotherapy/immunotherapy/hormonal therapy: no  Assessment: Plan:    Pain of both hip joints - Plan: DG Hip Unilat W or W/O Pelvis 1 View Right, DG HIPS BILAT WITH PELVIS 3-4 VIEWS, AMB referral to orthopedics, CANCELED: DG Hip Unilat W or W/O Pelvis 1 View Left  Cough - Plan: guaiFENesin-codeine 100-10 MG/5ML syrup  Adenocarcinoma of right lung, stage 3 (HCC)  Bilateral lower extremity edema   Pain in both hips: The patient was referred for an x-ray of her hips which returned showing:  1. Lumbar spondylosis. 2. Osteoarthritis of both SI joints. 3. No acute osseous abnormality of either hip. 4. Gas within the rectum projects below the level of the pubic symphysis. Pelvic floor relaxation or rectocele could account for this appearance.  She will be referred to orthopedics for evaluation of her pain which is likely secondary to SI joint osteoarthritis.   Cough: Patient was given a prescription for Robitussin with codeine.   Stage III adenocarcinoma of the right lung: The patient continues to be followed by Dr. Julien [name].  She is not currently actively treated.  She is scheduled to follow-up with Dr. Julien [name] on 05/10/2018.  Bilateral lower extremity edema: Patient is currently taking Lasix 20 mg once daily.  She has been instructed that she can increase this to 40 mg daily for 3 days as needed.  She will then return to her baseline dose of 20 mg daily.  She is also been instructed to use compression stockings and to elevate her legs as she is able.  Please see After Visit Summary for patient specific instructions.  Future Appointments  Date Time Provider [place]  03/08/2018 11:00 AM Nicole Shin, MD [facility] None  05/07/2018 11:30 AM [facility] 1 [facility] None  05/10/2018 10:45 AM Nicole Bears, MD  [facility] None    Orders Placed This Encounter  Procedures  . DG Hip Unilat W or W/O Pelvis 1 View Right  . DG HIPS BILAT WITH PELVIS 3-4 VIEWS  . AMB referral to orthopedics       Subjective:   Patient ID:  Nicole [name] is a 77 y.o. (DOB 12-09-1940) female.  Chief Complaint:  Chief Complaint  Patient presents with  . Back Pain    HPI [name] is a 77 year old female with a history of a stage III adenocarcinoma of the right lung.  She is not currently actively treated.  She is scheduled to see Dr. Julien [name] on 05/10/2018 for follow-up.  She reports having continued bilateral lower extremity edema since 01/05/2018.  She is currently taking Lasix 20 mg once daily.  She has bilateral lower back pain and hip pain that she describes as a "shooting" pain.  She denies trauma or changes in activity.  She reports that she cannot sleep at night due to coughing.  She is had clear to yellowish sputum.  She reports that her cough causes her back pain to worsen.  After consulting her primary care provider she has stopped Eliquis despite her history of A. fib.  It is believed that her A. fib was caused by her cancer treatment.  She reverted to a normal sinus rhythm after completion of therapy.  Medications: I have reviewed the patient's current medications.  Allergies:  Allergies  Allergen Reactions  . No Known Allergies     Past Medical History:  Diagnosis Date  . A-fib ([place])   . Adenocarcinoma of right lung, stage 3 ([place]) 07/28/2016  . Atrial flutter ([place]) 10/05/2016  . Bronchitis    hx of  . Cancer [facility]) 2004   uterine/cervical  . Cancer-related pain 10/06/2016  . Coronary artery disease 02/21/2017   JAN 2019 Prox RCA lesion is 25% stenosed. Prox Cx lesion is 30% stenosed. Mid Cx lesion is 25% stenosed. Ost 3rd Mrg lesion is 30% stenosed. Prox LAD lesion is 25% stenosed. Mid LAD lesion is 25% stenosed. The left ventricular systolic function is normal. LV end diastolic pressure is  normal. The left ventricular ejection fraction is 55-65% by visual estimate. There is no aortic valve stenosis.   N  . Diabetes mellitus    type 2  . Edema 07/12/2017  . Essential hypertension 01/20/2009  . Gallstones   . Headache   . History of radiation therapy 08/09/2016 to 09/19/2016   Right lung was treated to 60 Gy in 30 fractions at 2 Gy per fraction  . Hypertension   . Hypertriglyceridemia 08/22/2012  . Low back pain   . Lung mass    with lymphadenopathy  . Osteoarthritis   . Type II diabetes mellitus with manifestations ([place]) 01/20/2009   Estimated Creatinine Clearance: 49.8 mL/min (by C-G formula based on SCr of 1 mg/dL).    Past Surgical History:  Procedure Laterality Date  . ABDOMINAL HYSTERECTOMY  2004  . APPENDECTOMY  when 77 years old  . COLONOSCOPY  08-05-09   Nicole [name]  . INCONTINENCE SURGERY    . KNEE ARTHROSCOPY Left   . LEFT HEART CATH AND CORONARY ANGIOGRAPHY N/A 02/24/2017   Procedure: LEFT HEART CATH AND CORONARY ANGIOGRAPHY;  Surgeon: Nicole Booze, MD;  Location: [place] [facility];  Service: Cardiovascular;  Laterality: N/A;  . LEFT HEART CATHETERIZATION WITH CORONARY ANGIOGRAM N/A 07/19/2013   Procedure: LEFT HEART CATHETERIZATION WITH CORONARY ANGIOGRAM;  Surgeon: Nicole Grooms, MD;  Location: [facility] CATH LAB;  Service: Cardiovascular;  Laterality: N/A;  . POLYPECTOMY  08-05-09   2 polyps  . TOTAL KNEE ARTHROPLASTY Left 03/28/2012   Procedure: LEFT TOTAL KNEE ARTHROPLASTY;  Surgeon: Nicole Bastos, MD;  Location: [facility];  Service: Orthopedics;  Laterality: Left;  . TUBAL LIGATION    . VIDEO BRONCHOSCOPY WITH ENDOBRONCHIAL ULTRASOUND N/A 07/11/2016   Procedure: VIDEO BRONCHOSCOPY WITH ENDOBRONCHIAL ULTRASOUND;  Surgeon: Nicole Garfinkel, MD;  Location: [place];  Service: Pulmonary;  Laterality: N/A;    Family History  Problem Relation Age of Onset  . Heart attack Father   . Heart disease Father   . Arthritis Other   . Cancer Other        colon, lst  degree relative  . Diabetes Other        st degree relative  . Hyperlipidemia Other   . Hypertension Other   . Colon cancer Neg Hx     Social History   Socioeconomic History  . Marital status: Married    Spouse name: Not on file  . Number of children: Not on file  . Years of education: Not on file  . Highest education level: Not on file  Occupational History  . Occupation: retired    Fish farm manager: RETIRED  Social Needs  . Financial resource strain: Not on file  . Food insecurity:    Worry: Not on file    Inability: Not on file  . Transportation needs:    Medical: Not on file  Non-medical: Not on file  Tobacco Use  . Smoking status: Never Smoker  . Smokeless tobacco: Never Used  Substance and Sexual Activity  . Alcohol use: No    Alcohol/week: 0.0 standard drinks  . Drug use: No  . Sexual activity: Not Currently  Lifestyle  . Physical activity:    Days per week: Not on file    Minutes per session: Not on file  . Stress: Not on file  Relationships  . Social connections:    Talks on phone: Not on file    Gets together: Not on file    Attends religious service: Not on file    Active member of club or organization: Not on file    Attends meetings of clubs or organizations: Not on file    Relationship status: Not on file  . Intimate partner violence:    Fear of current or ex partner: Not on file    Emotionally abused: Not on file    Physically abused: Not on file    Forced sexual activity: Not on file  Other Topics Concern  . Not on file  Social History Narrative   Regular exercise- Yes    Past Medical History, Surgical history, Social history, and Family history were reviewed and updated as appropriate.   Please see review of systems for further details on the patient's review from today.   Review of Systems:  Review of Systems  Constitutional: Negative for chills, diaphoresis, fatigue and fever.  HENT: Negative for congestion, postnasal drip, rhinorrhea and  sore throat.   Respiratory: Positive for cough. Negative for shortness of breath and wheezing.   Cardiovascular: Negative for palpitations.  Musculoskeletal: Positive for arthralgias and back pain.  Neurological: Negative for headaches.    Objective:   Physical Exam:  BP 114/66 (BP Location: Left Arm, Patient Position: Sitting)   Pulse 77   Temp 97.9 F (36.6 C) (Oral)   Resp 20   Ht 5' 5"  (1.651 m)   Wt 188 lb 8 oz (85.5 kg)   SpO2 100%   BMI 31.37 kg/m  ECOG: 0  Physical Exam Constitutional:      General: She is not in acute distress.    Appearance: She is not diaphoretic.  HENT:     Head: Normocephalic and atraumatic.     Right Ear: External ear normal.     Left Ear: External ear normal.     Mouth/Throat:     Pharynx: No oropharyngeal exudate.  Neck:     Musculoskeletal: Normal range of motion and neck supple.  Cardiovascular:     Rate and Rhythm: Normal rate and regular rhythm.     Heart sounds: Normal heart sounds. No murmur. No friction rub. No gallop.   Pulmonary:     Effort: Pulmonary effort is normal. No respiratory distress.     Breath sounds: Normal breath sounds. No wheezing or rales.  Musculoskeletal:        General: Swelling (Bilateral lower extremity edema) present. No tenderness or deformity.  Lymphadenopathy:     Cervical: No cervical adenopathy.  Skin:    General: Skin is warm and dry.     Findings: No erythema or rash.  Neurological:     Mental Status: She is alert.     Coordination: Coordination normal.  Psychiatric:        Behavior: Behavior normal.        Thought Content: Thought content normal.        Judgment: Judgment normal.  Lab Review:     Component Value Date/Time   NA 139 01/08/2018 1437   NA 140 02/21/2017 1238   NA 139 01/26/2017 0915   K 3.7 01/08/2018 1437   K 4.1 01/26/2017 0915   CL 103 01/08/2018 1437   CO2 26 01/08/2018 1437   CO2 24 01/26/2017 0915   GLUCOSE 293 (H) 01/08/2018 1437   GLUCOSE 128 01/26/2017  0915   BUN 23 01/08/2018 1437   BUN 31 (H) 02/21/2017 1238   BUN 22.0 01/26/2017 0915   CREATININE 1.14 (H) 01/08/2018 1437   CREATININE 0.9 01/26/2017 0915   CALCIUM 9.2 01/08/2018 1437   CALCIUM 9.2 01/26/2017 0915   PROT 6.6 01/08/2018 1437   PROT 7.3 01/26/2017 0915   ALBUMIN 2.9 (L) 01/08/2018 1437   ALBUMIN 2.9 (L) 01/26/2017 0915   AST 13 (L) 01/08/2018 1437   AST 13 01/26/2017 0915   ALT 17 01/08/2018 1437   ALT 12 01/26/2017 0915   ALKPHOS 66 01/08/2018 1437   ALKPHOS 72 01/26/2017 0915   BILITOT 0.6 01/08/2018 1437   BILITOT 0.37 01/26/2017 0915   GFRNONAA 47 (L) 01/08/2018 1437   GFRAA 54 (L) 01/08/2018 1437       Component Value Date/Time   WBC 5.4 01/08/2018 1437   WBC 10.9 (H) 02/17/2017 1714   RBC 4.07 01/08/2018 1437   HGB 12.7 01/08/2018 1437   HGB 13.5 02/21/2017 1238   HGB 13.0 01/26/2017 0915   HCT 38.4 01/08/2018 1437   HCT 42.3 02/21/2017 1238   HCT 39.3 01/26/2017 0915   PLT 214 01/08/2018 1437   PLT 186 02/21/2017 1238   MCV 94.3 01/08/2018 1437   MCV 87 02/21/2017 1238   MCV 86.7 01/26/2017 0915   MCH 31.2 01/08/2018 1437   MCHC 33.1 01/08/2018 1437   RDW 13.1 01/08/2018 1437   RDW 15.8 (H) 02/21/2017 1238   RDW 14.5 01/26/2017 0915   LYMPHSABS 0.8 01/08/2018 1437   LYMPHSABS 0.5 (L) 01/26/2017 0915   MONOABS 0.5 01/08/2018 1437   MONOABS 0.5 01/26/2017 0915   EOSABS 0.1 01/08/2018 1437   EOSABS 0.2 01/26/2017 0915   BASOSABS 0.0 01/08/2018 1437   BASOSABS 0.0 01/26/2017 0915   -------------------------------  Imaging from last 24 hours (if applicable):  Radiology interpretation: Ct Chest W Contrast  Result Date: 12/29/2017 CLINICAL DATA:  History of right lung cancer diagnosed in 2018 status post chemotherapy and radiation. EXAM: CT CHEST WITH CONTRAST TECHNIQUE: Multidetector CT imaging of the chest was performed during intravenous contrast administration. CONTRAST:  60m OMNIPAQUE IOHEXOL 300 MG/ML  SOLN COMPARISON:  Chest CT  09/29/2017 FINDINGS: Cardiovascular: Normal heart size. Coronary arterial and thoracic aortic vascular calcifications. Trace fluid superior pericardial recess. Mediastinum/Nodes: No enlarged axillary, mediastinal or hilar lymphadenopathy. Small hiatal hernia. Lungs/Pleura: Central airways are patent. Small focal area of ground-glass opacity left lower lobe (image 113; series 6). Stable post radiation changes paramediastinal right lung. No pleural effusion or pneumothorax. Stable 3 mm left upper lobe nodule (image 41; series 6) compatible with benign process. Stable 4 mm posterior right upper lobe nodule (image 47; series 6). Stable 4 mm superior segment left lower lobe nodule (image 59; series 6). Upper Abdomen: No acute process. Musculoskeletal: Thoracic spine degenerative changes. No aggressive or acute appearing osseous lesions. IMPRESSION: Stable postradiation changes within the paramediastinal right lung and medial right lower lobe. No evidence for localized recurrence or metastatic disease. Aortic Atherosclerosis (ICD10-I70.0). Electronically Signed   By: DPolly CobiaD.  On: 12/29/2017 11:52   Dg Abd Acute W/chest  Result Date: 12/29/2017 CLINICAL DATA:  Left flank pain for 2 weeks EXAM: DG ABDOMEN ACUTE W/ 1V CHEST COMPARISON:  09/29/2017 FINDINGS: Cardiac shadows within normal limits. Post radiation changes are noted along the medial right lung base stable from the previous CT examination. No acute infiltrate is noted. Scattered large and small bowel gas is noted. Mild retained fecal material is seen without obstructive change. Degenerative change of the lumbar spine is noted. No abnormal mass or abnormal calcifications are seen. Postsurgical changes in the pelvis are noted. No free air is seen IMPRESSION: No acute abnormality noted. Electronically Signed   By: Inez Catalina M.D.   On: 12/29/2017 09:02   Dg Hips Bilat With Pelvis 3-4 Views  Result Date: 01/08/2018 CLINICAL DATA:  Bilateral hip  pain and ankle swelling since Saturday. EXAM: DG HIP (WITH OR WITHOUT PELVIS) 3-4V BILAT COMPARISON:  None. FINDINGS: Degenerative disc disease and osteophytes of the included lumbar spine from L2 through S1. Osteoarthritis of both SI joints without pelvic diastasis or fracture. No suspicious osseous lesions. Surgical clips project over the pelvis bilaterally. Hip joints are maintained without loss of sphericity of the femoral heads. No joint dislocation. No acute fracture or suspicious osseous lesions. No acute soft tissue abnormality. Gas within the rectum projects below the level of the pubic symphysis. The possibility of pelvic floor relaxation or rectocele is not excluded. IMPRESSION: 1. Lumbar spondylosis. 2. Osteoarthritis of both SI joints. 3. No acute osseous abnormality of either hip. 4. Gas within the rectum projects below the level of the pubic symphysis. Pelvic floor relaxation or rectocele could account for this appearance. Electronically Signed   By: Ashley Royalty M.D.   On: 01/08/2018 17:01

## 2018-01-10 NOTE — Telephone Encounter (Signed)
Pt.notified

## 2018-01-10 NOTE — Telephone Encounter (Addendum)
Appt with Dr Paralee Cancel jan 13th at [phone] . Pt asking if it can be moved up. I told her that was first available. " You  Mean I have to live with this pain for that long " Pt said she will call Dr Tamala Julian ,Sports medicine at [facility]. I told her to call back if she wants to cancel with Dr Alvan Dame.

## 2018-01-10 NOTE — Telephone Encounter (Signed)
Pt has appt tomorrow with Dr Tamala Julian at [facility] for her hip pain. I cancelled appt with Dr Alvan Dame

## 2018-01-11 ENCOUNTER — Ambulatory Visit: Payer: Medicare Other | Admitting: Family Medicine

## 2018-01-11 VITALS — BP 110/78 | HR 106 | Ht 65.0 in | Wt 187.0 lb

## 2018-01-11 DIAGNOSIS — M8448XA Pathological fracture, other site, initial encounter for fracture: Secondary | ICD-10-CM

## 2018-01-11 DIAGNOSIS — M47818 Spondylosis without myelopathy or radiculopathy, sacral and sacrococcygeal region: Secondary | ICD-10-CM

## 2018-01-11 DIAGNOSIS — G893 Neoplasm related pain (acute) (chronic): Secondary | ICD-10-CM | POA: Diagnosis not present

## 2018-01-11 MED ORDER — GABAPENTIN 100 MG PO CAPS: 100.0000 mg | ORAL_CAPSULE | Freq: Every day | ORAL | 3 refills | Status: DC

## 2018-01-11 MED ORDER — VITAMIN D (ERGOCALCIFEROL) 1.25 MG (50000 UNIT) PO CAPS: 50000.0000 [IU] | ORAL_CAPSULE | ORAL | 0 refills | Status: DC

## 2018-01-11 NOTE — Assessment & Plan Note (Signed)
Likely contributing as well.  Discussed which activities to do which wants to avoid.  Patient will try possible over-the-counter medication.  Patient is status post radiation as well.

## 2018-01-11 NOTE — Patient Instructions (Signed)
Good to see you again  pennsaid pinkie amount topically 2 times daily as needed.  Ice 20 minutes 2 times daily. Usually after activity and before bed. Exercises 3 times a week.  Gabapentin 200mg  at night for pain  Once weekly vitamin D 8 weeks Over the counter get Tart cherry extract any dose also at night We are ordering MRI of the back and pelvis, call [phone] to schedule  Once you know when you are having MRI then call us at [phone] and we will see you 1-3 days after to discuss

## 2018-01-11 NOTE — Progress Notes (Signed)
[name] [address], [place] [address] Phone: [phone] Subjective:   Nicole [name], am serving as a scribe for Dr. Hulan Saas.   CC: Back pain  [id number]  Nicole [name] is a 77 y.o. female coming in with complaint of back pain. Patient states that she had spasms on Sunday with pain that had been chronic prior to this incident. Monday she had an xray of her hip at the [facility] and was told that she has pinched nerve. Is having left leg swelling for months now. Is using a medicated patch since Sunday. Coughing increases her pain and she does mention having chronic coughing since her chemo treatment last year. Also mentions having a rash on her lower back that is chronic, lasting one year in duration.  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  At the moment has had a clear bill of health for some time.  Patient is also the primary caregiver for her husband who is significantly ill as well.  Patient was sent in and had x-rays done that were independently visualized by me.  Patient has severe bilateral sacroiliac arthritis.  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       Past Medical History:  Diagnosis Date  . A-fib ([place])   . Adenocarcinoma of right lung, stage 3 ([place]) 07/28/2016  . Atrial flutter ([place]) 10/05/2016  . Bronchitis    hx of  . Cancer [facility]) 2004   uterine/cervical  . Cancer-related pain 10/06/2016  . Coronary artery disease 02/21/2017   JAN 2019 Prox RCA lesion is 25% stenosed. Prox Cx lesion is 30% stenosed. Mid Cx lesion is 25% stenosed. Ost 3rd Mrg lesion is 30% stenosed. Prox LAD lesion is 25% stenosed. Mid LAD lesion is 25% stenosed. The left ventricular systolic function is normal. LV end diastolic pressure is normal. The left ventricular ejection fraction is 55-65% by visual estimate. There is [name] aortic valve stenosis.   N  . Diabetes mellitus    type 2  .  Edema 07/12/2017  . Essential hypertension 01/20/2009  . Gallstones   . Headache   . History of radiation therapy 08/09/2016 to 09/19/2016   Right lung was treated to 60 Gy in 30 fractions at 2 Gy per fraction  . Hypertension   . Hypertriglyceridemia 08/22/2012  . Low back pain   . Lung mass    with lymphadenopathy  . Osteoarthritis   . Type II diabetes mellitus with manifestations ([place]) 01/20/2009   Estimated Creatinine Clearance: 49.8 mL/min (by C-G formula based on SCr of 1 mg/dL).   Past Surgical History:  Procedure Laterality Date  . ABDOMINAL HYSTERECTOMY  2004  . APPENDECTOMY  when 77 years old  . COLONOSCOPY  08-05-09   [name]  . INCONTINENCE SURGERY    . KNEE ARTHROSCOPY Left   . LEFT HEART CATH AND CORONARY ANGIOGRAPHY N/A 02/24/2017   Procedure: LEFT HEART CATH AND CORONARY ANGIOGRAPHY;  Surgeon: Jettie Booze, MD;  Location: [place] [facility];  Service: Cardiovascular;  Laterality: N/A;  . LEFT HEART CATHETERIZATION WITH CORONARY ANGIOGRAM N/A 07/19/2013   Procedure: LEFT HEART CATHETERIZATION WITH CORONARY ANGIOGRAM;  Surgeon: Sinclair Grooms, MD;  Location: [facility] CATH LAB;  Service: Cardiovascular;  Laterality: N/A;  . POLYPECTOMY  08-05-09   2 polyps  . TOTAL KNEE ARTHROPLASTY Left 03/28/2012   Procedure: LEFT TOTAL KNEE ARTHROPLASTY;  Surgeon: Tobi Bastos,  MD;  Location: [facility];  Service: Orthopedics;  Laterality: Left;  . TUBAL LIGATION    . VIDEO BRONCHOSCOPY WITH ENDOBRONCHIAL ULTRASOUND N/A 07/11/2016   Procedure: VIDEO BRONCHOSCOPY WITH ENDOBRONCHIAL ULTRASOUND;  Surgeon: Marshell Garfinkel, MD;  Location: [place];  Service: Pulmonary;  Laterality: N/A;   Social History   Socioeconomic History  . Marital status: Married    Spouse name: Not on file  . Number of children: Not on file  . Years of education: Not on file  . Highest education level: Not on file  Occupational History  . Occupation: retired    Fish farm manager: RETIRED  Social Needs  . Financial  resource strain: Not on file  . Food insecurity:    Worry: Not on file    Inability: Not on file  . Transportation needs:    Medical: Not on file    Non-medical: Not on file  Tobacco Use  . Smoking status: Never Smoker  . Smokeless tobacco: Never Used  Substance and Sexual Activity  . Alcohol use: [name]    Alcohol/week: 0.0 standard drinks  . Drug use: [name]  . Sexual activity: Not Currently  Lifestyle  . Physical activity:    Days per week: Not on file    Minutes per session: Not on file  . Stress: Not on file  Relationships  . Social connections:    Talks on phone: Not on file    Gets together: Not on file    Attends religious service: Not on file    Active member of club or organization: Not on file    Attends meetings of clubs or organizations: Not on file    Relationship status: Not on file  Other Topics Concern  . Not on file  Social History Narrative   Regular exercise- Yes   Allergies  Allergen Reactions  . [name] Known Allergies    Family History  Problem Relation Age of Onset  . Heart attack Father   . Heart disease Father   . Arthritis Other   . Cancer Other        colon, lst degree relative  . Diabetes Other        st degree relative  . Hyperlipidemia Other   . Hypertension Other   . Colon cancer Neg Hx     Current Outpatient Medications (Endocrine & Metabolic):  .  metFORMIN (GLUCOPHAGE-XR) 500 MG 24 hr tablet, Take 2 tablets (1,000 mg total) by mouth daily with breakfast.  Current Outpatient Medications (Cardiovascular):  .  diltiazem (CARDIZEM CD) 360 MG 24 hr capsule, Take 1 capsule (360 mg total) by mouth daily. .  furosemide (LASIX) 20 MG tablet, Take 1 tablet (20 mg total) by mouth daily as needed (For swelling). .  irbesartan (AVAPRO) 300 MG tablet, TAKE 1 TABLET BY MOUTH ONCE DAILY  Current Outpatient Medications (Respiratory):  .  benzonatate (TESSALON) 100 MG capsule, TAKE 2 CAPSULES BY MOUTH THREE TIMES DAILY AS NEEDED FOR COUGH .   guaiFENesin-codeine 100-10 MG/5ML syrup, Take 5 mLs by mouth every 6 (six) hours as needed for cough.    Current Outpatient Medications (Other):  .  azithromycin (ZITHROMAX Z-PAK) 250 MG tablet, Take 2 tablets on day 1 and then 1 tablet daily until complete. .  COLLAGEN PO, Take 1 tablet by mouth daily.  [name]  glucose blood (ACCU-CHEK GUIDE) test strip, 1 each by Other route daily. Use to check blood sugars daily DX code: E11.8 .  glucose blood (COOL BLOOD GLUCOSE TEST STRIPS)  test strip, Use as instructed to check blood sugar twice daily. DX: E11.9 .  Lancets (ACCU-CHEK SOFT TOUCH) lancets, Use to check blood sugars daily Dx E11.9 .  gabapentin (NEURONTIN) 100 MG capsule, Take 1 capsule (100 mg total) by mouth at bedtime. .  Vitamin D, Ergocalciferol, (DRISDOL) 1.25 MG (50000 UT) CAPS capsule, Take 1 capsule (50,000 Units total) by mouth every 7 (seven) days.    Past medical history, social, surgical and family history all reviewed in electronic medical record.  [name] pertanent information unless stated regarding to the chief complaint.   Review of Systems:  [name] headache, visual changes, nausea, vomiting, diarrhea, constipation, dizziness, abdominal pain, skin rash, fevers, chills, night sweats, weight loss, swollen lymph nodes, , chest pain, shortness of breath, mood changes.  Positive muscle aches, joint swelling, body aches  Objective  Blood pressure 110/78, pulse (!) 106, height 5' 5"  (1.651 m), weight 187 lb (84.8 kg), SpO2 93 %.   General: [name] apparent distress alert and oriented x3 mood and affect normal, dressed appropriately.  HEENT: Pupils equal, extraocular movements intact  Respiratory: Patient's speak in full sentences and does not appear short of breath  Cardiovascular: 3+lower extremity edema to knees, tender, [name] erythema  Skin: Warm dry intact with [name] signs of infection or rash on extremities or on axial skeleton.  Abdomen: Soft mildly distended  Lymph: [name] lymphadenopathy of  posterior or anterior cervical chain or axillae bilaterally.  Gait severely shuffling gait noted with weakness of the lower extremities the patient uses a suitcase to help give her support MSK:  N moderate to severe arthritic changes Patient does have muscle atrophy of the lower extremity noted.  Patient does have some mild numbness and appears peripherally.  Patient is strength is weak at 3+ out of 5 bilateral hip flexors as well as dorsiflexion of the foot  Back exam does have some degenerative scoliosis.  Severe tenderness to palpation over the left sacroiliac joint.  Mild crepitus even with range of motion noted.  Patient has some spinous process tenderness noted of the L5 vertebrae as well.  Significant tightness of the paraspinal musculature.  Deep tendon reflexes is decreased as well bilaterally left greater than right.   Impression and Recommendations:     This case required medical decision making of moderate complexity. The above documentation has been reviewed and is accurate and complete Lyndal Pulley, DO       Note: This dictation was prepared with Dragon dictation along with smaller phrase technology. Any transcriptional errors that result from this process are unintentional.

## 2018-01-11 NOTE — Assessment & Plan Note (Signed)
Severe arthritic changes of bilateral sacroiliac joints.  Patient also has moderate to severe degenerative disc disease of the lumbar spine.  Past medical history significant for adenocarcinoma of the lungs.  Patient does have atrophy of the lower extremities with weakness of 4 out of 5 strength and decrease in deep tendon reflexes.  Concern for the potential for either metastatic cancer patient also having some weight loss.  Patient did see her oncologist recently and had been doing relatively well though.  In addition to this I am concerned for an insufficiency fracture of the sacroiliac joint.  Patient will be set up for an MRI secondary to the weakness as well.  Depending on the findings we will discuss medical management.  Patient will follow-up 1 to 2 days after

## 2018-01-15 ENCOUNTER — Ambulatory Visit: Payer: Medicare Other | Admitting: Internal Medicine

## 2018-01-23 ENCOUNTER — Ambulatory Visit
Admission: RE | Admit: 2018-01-23 | Discharge: 2018-01-23 | Disposition: A | Discharge status: Home / Self Care | Payer: Medicare Other | Source: Ambulatory Visit | Attending: Family Medicine | Admitting: Family Medicine

## 2018-01-23 DIAGNOSIS — M8448XA Pathological fracture, other site, initial encounter for fracture: Secondary | ICD-10-CM

## 2018-01-23 DIAGNOSIS — M48061 Spinal stenosis, lumbar region without neurogenic claudication: Secondary | ICD-10-CM | POA: Diagnosis not present

## 2018-01-24 NOTE — Progress Notes (Signed)
[name] [address], [place] [address] Phone: [phone] Subjective:    Nicole [name], am serving as a scribe for Dr. Hulan Saas.  CC: back pain and leg pain follow up   [id number]  Nicole [name] is a 78 y.o. female coming in with complaint of back pain.  Is here to discuss MRI results and next steps.    Back pain continued and patient was sent for an MRI.  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  Right greater than left.  Past Medical History:  Diagnosis Date  . A-fib ([place])   . Adenocarcinoma of right lung, stage 3 ([place]) 07/28/2016  . Atrial flutter ([place]) 10/05/2016  . Bronchitis    hx of  . Cancer [facility]) 2004   uterine/cervical  . Cancer-related pain 10/06/2016  . Coronary artery disease 02/21/2017   JAN 2019 Prox RCA lesion is 25% stenosed. Prox Cx lesion is 30% stenosed. Mid Cx lesion is 25% stenosed. Ost 3rd Mrg lesion is 30% stenosed. Prox LAD lesion is 25% stenosed. Mid LAD lesion is 25% stenosed. The left ventricular systolic function is normal. LV end diastolic pressure is normal. The left ventricular ejection fraction is 55-65% by visual estimate. There is [name] aortic valve stenosis.   N  . Diabetes mellitus    type 2  . Edema 07/12/2017  . Essential hypertension 01/20/2009  . Gallstones   . Headache   . History of radiation therapy 08/09/2016 to 09/19/2016   Right lung was treated to 60 Gy in 30 fractions at 2 Gy per fraction  . Hypertension   . Hypertriglyceridemia 08/22/2012  . Low back pain   . Lung mass    with lymphadenopathy  . Osteoarthritis   . Type II diabetes mellitus with manifestations ([place]) 01/20/2009   Estimated Creatinine Clearance: 49.8 mL/min (by C-G formula based on SCr of 1 mg/dL).   Past Surgical History:  Procedure Laterality Date  . ABDOMINAL HYSTERECTOMY  2004  . APPENDECTOMY  when 78  years old  . COLONOSCOPY  08-05-09   [name]  . INCONTINENCE SURGERY    . KNEE ARTHROSCOPY Left   . LEFT HEART CATH AND CORONARY ANGIOGRAPHY N/A 02/24/2017   Procedure: LEFT HEART CATH AND CORONARY ANGIOGRAPHY;  Surgeon: Jettie Booze, MD;  Location: [place] [facility];  Service: Cardiovascular;  Laterality: N/A;  . LEFT HEART CATHETERIZATION WITH CORONARY ANGIOGRAM N/A 07/19/2013   Procedure: LEFT HEART CATHETERIZATION WITH CORONARY ANGIOGRAM;  Surgeon: Sinclair Grooms, MD;  Location[facility] CATH LAB;  Service: Cardiovascular;  Laterality: N/A;  . POLYPECTOMY  08-05-09   2 polyps  . TOTAL KNEE ARTHROPLASTY Left 03/28/2012   Procedure: LEFT TOTAL KNEE ARTHROPLASTY;  Surgeon: Tobi Bastos, MD;  [facility];  Service: Orthopedics;  Laterality: Left;  . TUBAL LIGATION    . VIDEO BRONCHOSCOPY WITH ENDOBRONCHIAL ULTRASOUND N/A 07/11/2016   Procedure: VIDEO BRONCHOSCOPY WITH ENDOBRONCHIAL ULTRASOUND;  Surgeon: Marshell Garfinkel, MD;  Location: [place];  Service: Pulmonary;  Laterality: N/A;   Social History   Socioeconomic History  . Marital status: Married    Spouse name: Not on file  . Number of children: Not on file  . Years of education: Not on file  . Highest education level: Not on file  Occupational History  . Occupation: retired    Fish farm manager: RETIRED  Social  Needs  . Financial resource strain: Not on file  . Food insecurity:    Worry: Not on file    Inability: Not on file  . Transportation needs:    Medical: Not on file    Non-medical: Not on file  Tobacco Use  . Smoking status: Never Smoker  . Smokeless tobacco: Never Used  Substance and Sexual Activity  . Alcohol use: [name]    Alcohol/week: 0.0 standard drinks  . Drug use: [name]  . Sexual activity: Not Currently  Lifestyle  . Physical activity:    Days per week: Not on file    Minutes per session: Not on file  . Stress: Not on file  Relationships  . Social connections:    Talks on phone: Not on file    Gets  together: Not on file    Attends religious service: Not on file    Active member of club or organization: Not on file    Attends meetings of clubs or organizations: Not on file    Relationship status: Not on file  Other Topics Concern  . Not on file  Social History Narrative   Regular exercise- Yes   Allergies  Allergen Reactions  . [name] Known Allergies    Family History  Problem Relation Age of Onset  . Heart attack Father   . Heart disease Father   . Arthritis Other   . Cancer Other        colon, lst degree relative  . Diabetes Other        st degree relative  . Hyperlipidemia Other   . Hypertension Other   . Colon cancer Neg Hx     Current Outpatient Medications (Endocrine & Metabolic):  .  metFORMIN (GLUCOPHAGE-XR) 500 MG 24 hr tablet, Take 2 tablets (1,000 mg total) by mouth daily with breakfast.  Current Outpatient Medications (Cardiovascular):  .  diltiazem (CARDIZEM CD) 360 MG 24 hr capsule, Take 1 capsule (360 mg total) by mouth daily. .  furosemide (LASIX) 20 MG tablet, Take 1 tablet (20 mg total) by mouth daily as needed (For swelling). .  irbesartan (AVAPRO) 300 MG tablet, TAKE 1 TABLET BY MOUTH ONCE DAILY  Current Outpatient Medications (Respiratory):  .  benzonatate (TESSALON) 100 MG capsule, TAKE 2 CAPSULES BY MOUTH THREE TIMES DAILY AS NEEDED FOR COUGH .  guaiFENesin-codeine 100-10 MG/5ML syrup, Take 5 mLs by mouth every 6 (six) hours as needed for cough.    Current Outpatient Medications (Other):  .  azithromycin (ZITHROMAX Z-PAK) 250 MG tablet, Take 2 tablets on day 1 and then 1 tablet daily until complete. .  COLLAGEN PO, Take 1 tablet by mouth daily.  [name]  gabapentin (NEURONTIN) 100 MG capsule, Take 1 capsule (100 mg total) by mouth at bedtime. [name]  glucose blood (ACCU-CHEK GUIDE) test strip, 1 each by Other route daily. Use to check blood sugars daily DX code: E11.8 .  glucose blood (COOL BLOOD GLUCOSE TEST STRIPS) test strip, Use as instructed to check  blood sugar twice daily. DX: E11.9 .  Lancets (ACCU-CHEK SOFT TOUCH) lancets, Use to check blood sugars daily Dx E11.9 .  Vitamin D, Ergocalciferol, (DRISDOL) 1.25 MG (50000 UT) CAPS capsule, Take 1 capsule (50,000 Units total) by mouth every 7 (seven) days.    Past medical history, social, surgical and family history all reviewed in electronic medical record.  [name] pertanent information unless stated regarding to the chief complaint.   Review of Systems:  [name] headache, visual changes, nausea,  vomiting, diarrhea, constipation, dizziness, abdominal pain, skin rash, fevers, chills, night sweats, weight loss, swollen lymph nodes, body aches, joint swelling, , chest pain, shortness of breath, mood changes.  Positive muscle aches  Objective  Blood pressure 130/80, pulse 91, height 5' 5"  (1.651 m), weight 193 lb (87.5 kg), SpO2 90 %.    General: [name] apparent distress alert and oriented x3 mood and affect normal, dressed appropriately.  HEENT: Pupils equal, extraocular movements intact  Respiratory: Patient's speak in full sentences and does not appear short of breath  Cardiovascular: 2+lower extremity edema, mild tender, [name] erythema  Skin: Warm dry intact with [name] signs of infection or rash on extremities or on axial skeleton.  Abdomen: Soft nontender  Neuro: Cranial nerves II through XII are intact,  Lymph: [name] lymphadenopathy of posterior or anterior cervical chain or axillae bilaterally.  Gait severely antalgic MSK:  tender with limited range of motion and stability and symmetric strength and tone of shoulders, elbows, wrist, hip, knee and ankles bilaterally.  Due to arthritic changes Back Exam:  Inspection: loss of lordosis  Motion: Flexion 40 deg, Extension 35 deg, Side Bending to 35 deg bilaterally,  Rotation to 35 deg bilaterally  SLR laying: Negative  XSLR laying: Negative  Palpable tenderness: Tender to palpation the paraspinal musculature lumbar spine right greater than left. FABER:  Positive Faber right. Sensory change: Gross sensation intact to all lumbar and sacral dermatomes.  Reflexes: 2+ at both patellar tendons, 2+ at achilles tendons, Babinski's downgoing.  Strength at foot  4-5 strength of the lower extremities bilaterally 1+ DTRs but symmetric at the Achilles mild peripheral neuropathy noted      Impression and Recommendations:     The above documentation has been reviewed and is accurate and complete Lyndal Pulley, DO       Note: This dictation was prepared with Dragon dictation along with smaller phrase technology. Any transcriptional errors that result from this process are unintentional.

## 2018-01-25 ENCOUNTER — Ambulatory Visit: Payer: Medicare Other | Admitting: Family Medicine

## 2018-01-25 VITALS — BP 130/80 | HR 91 | Ht 65.0 in | Wt 193.0 lb

## 2018-01-25 DIAGNOSIS — M5416 Radiculopathy, lumbar region: Secondary | ICD-10-CM

## 2018-01-25 DIAGNOSIS — M48061 Spinal stenosis, lumbar region without neurogenic claudication: Secondary | ICD-10-CM

## 2018-01-25 NOTE — Assessment & Plan Note (Signed)
Moderate to severe noted on MRI.  At this point likely contributing to most of patient's symptoms.  Discussed icing regimen.  Discussed which activities to do which was to avoid.  Patient is to increase activity slowly over the course the next several days.  Follow-up with me again in 3 weeks after the injection at that point we will discuss the possibility of starting formal physical therapy.

## 2018-01-25 NOTE — Patient Instructions (Signed)
Go to see you  We will try an epidural at L4/5 and call [phone] to schedule.  Once you know when you are having the injection then clal our office and set up an appointment 3 weeks After the injection  Continue all other medicines Happy New Year!

## 2018-01-30 ENCOUNTER — Other Ambulatory Visit: Payer: Medicare Other

## 2018-01-31 ENCOUNTER — Ambulatory Visit
Admission: RE | Admit: 2018-01-31 | Discharge: 2018-01-31 | Disposition: A | Discharge status: Home / Self Care | Payer: Medicare Other | Source: Ambulatory Visit | Attending: Family Medicine | Admitting: Family Medicine

## 2018-01-31 DIAGNOSIS — M5116 Intervertebral disc disorders with radiculopathy, lumbar region: Secondary | ICD-10-CM | POA: Diagnosis not present

## 2018-01-31 DIAGNOSIS — M5416 Radiculopathy, lumbar region: Secondary | ICD-10-CM

## 2018-01-31 MED ORDER — IOPAMIDOL (ISOVUE-M 200) INJECTION 41%
1.0000 mL | Freq: Once | INTRAMUSCULAR | Status: AC
  Administered 2018-01-31: 1 mL via EPIDURAL

## 2018-01-31 MED ORDER — METHYLPREDNISOLONE ACETATE 40 MG/ML INJ SUSP (RADIOLOG
120.0000 mg | Freq: Once | INTRAMUSCULAR | Status: AC
  Administered 2018-01-31: 120 mg via EPIDURAL

## 2018-01-31 NOTE — Discharge Instructions (Signed)

## 2018-02-01 ENCOUNTER — Other Ambulatory Visit: Payer: Self-pay | Admitting: Oncology

## 2018-02-06 ENCOUNTER — Telehealth: Payer: Self-pay | Admitting: Internal Medicine

## 2018-02-06 ENCOUNTER — Ambulatory Visit: Payer: Medicare Other | Admitting: Internal Medicine

## 2018-02-06 NOTE — Telephone Encounter (Signed)
Called patient and informed

## 2018-02-06 NOTE — Telephone Encounter (Signed)
Copied from Nicole [name] [phone]. Topic: Quick Communication - See Telephone Encounter >> Feb 06, 2018 10:41 AM Valla Leaver wrote: CRM for notification. See Telephone encounter for: 02/06/18.  Patient wants to know if she needs an appointment and/or advice due to her recent blood sugar increase. After she had the steroid epidural injection at [facility] on 01/31/2018, her sugar went up to the 200's range so she increased her metformin dosage from 1000 units to 1500 units in the morning. She has been doing this for about a week but it's still in 200 range. Please advise.

## 2018-02-06 NOTE — Telephone Encounter (Signed)
Pt should contact her endocrinologist for advise on any changes to her medications regarding DM

## 2018-02-07 ENCOUNTER — Ambulatory Visit: Payer: Medicare Other | Admitting: Endocrinology

## 2018-02-07 ENCOUNTER — Encounter: Payer: Self-pay | Admitting: Endocrinology

## 2018-02-07 VITALS — BP 134/64 | HR 62 | Ht 65.0 in | Wt 186.8 lb

## 2018-02-07 DIAGNOSIS — E118 Type 2 diabetes mellitus with unspecified complications: Secondary | ICD-10-CM

## 2018-02-07 LAB — POCT GLYCOSYLATED HEMOGLOBIN (HGB A1C): Hemoglobin A1C: 8.6 % — AB (ref 4.0–5.6)

## 2018-02-07 LAB — HM DIABETES EYE EXAM

## 2018-02-07 MED ORDER — SITAGLIPTIN PHOSPHATE 100 MG PO TABS: 100.0000 mg | ORAL_TABLET | Freq: Every day | ORAL | 11 refills | Status: DC

## 2018-02-07 NOTE — Patient Instructions (Signed)
Please continue the same metformin.   I have sent a prescription to your pharmacy, to add "Januvia."  Please come back for a follow-up appointment in 2 months.  check your blood sugar once a day.  vary the time of day when you check, between before the 3 meals, and at bedtime.  also check if you have symptoms of your blood sugar being too high or too low.  please keep a record of the readings and bring it to your next appointment here (or you can bring the meter itself).  You can write it on any piece of paper.  please call us sooner if your blood sugar goes below 70, or if you have a lot of readings over 200.

## 2018-02-07 NOTE — Progress Notes (Signed)
Subjective:    Patient ID: Nicole [name], female    DOB: 1940-05-04, 78 y.o.   MRN: [id number]  HPI Pt returns for f/u of diabetes mellitus: DM type: 2 Dx'ed: 8088 Complications: renal insuff and CAD. Therapy: metformin GDM: never DKA: never Severe hypoglycemia: never Pancreatitis: never Pancreatic imaging: 2018 CT: numerous tiny cysts in the pancreas.   Other: she is not a candidate for aggressive glycemic control, due to lung cancer; she takes intermitt steroids.  Interval history: she says cbg's are well-controlled.  pt states she feels well in general.  She had a steroid injection into the spine, last week.  Past Medical History:  Diagnosis Date  . A-fib ([place])   . Adenocarcinoma of right lung, stage 3 ([place]) 07/28/2016  . Atrial flutter ([place]) 10/05/2016  . Bronchitis    hx of  . Cancer [facility]) 2004   uterine/cervical  . Cancer-related pain 10/06/2016  . Coronary artery disease 02/21/2017   JAN 2019 Prox RCA lesion is 25% stenosed. Prox Cx lesion is 30% stenosed. Mid Cx lesion is 25% stenosed. Ost 3rd Mrg lesion is 30% stenosed. Prox LAD lesion is 25% stenosed. Mid LAD lesion is 25% stenosed. The left ventricular systolic function is normal. LV end diastolic pressure is normal. The left ventricular ejection fraction is 55-65% by visual estimate. There is no aortic valve stenosis.   N  . Diabetes mellitus    type 2  . Edema 07/12/2017  . Essential hypertension 01/20/2009  . Gallstones   . Headache   . History of radiation therapy 08/09/2016 to 09/19/2016   Right lung was treated to 60 Gy in 30 fractions at 2 Gy per fraction  . Hypertension   . Hypertriglyceridemia 08/22/2012  . Low back pain   . Lung mass    with lymphadenopathy  . Osteoarthritis   . Type II diabetes mellitus with manifestations ([place]) 01/20/2009   Estimated Creatinine Clearance: 49.8 mL/min (by C-G formula based on SCr of 1 mg/dL).    Past Surgical History:  Procedure Laterality Date  . ABDOMINAL  HYSTERECTOMY  2004  . APPENDECTOMY  when 78 years old  . COLONOSCOPY  08-05-09   [name]  . INCONTINENCE SURGERY    . KNEE ARTHROSCOPY Left   . LEFT HEART CATH AND CORONARY ANGIOGRAPHY N/A 02/24/2017   Procedure: LEFT HEART CATH AND CORONARY ANGIOGRAPHY;  Surgeon: Jettie Booze, MD;  Location: [place] CV LAB;  Service: Cardiovascular;  Laterality: N/A;  . LEFT HEART CATHETERIZATION WITH CORONARY ANGIOGRAM N/A 07/19/2013   Procedure: LEFT HEART CATHETERIZATION WITH CORONARY ANGIOGRAM;  Surgeon: Sinclair Grooms, MD;  Location: [facility] CATH LAB;  Service: Cardiovascular;  Laterality: N/A;  . POLYPECTOMY  08-05-09   2 polyps  . TOTAL KNEE ARTHROPLASTY Left 03/28/2012   Procedure: LEFT TOTAL KNEE ARTHROPLASTY;  Surgeon: Tobi Bastos, MD;  Location: [facility];  Service: Orthopedics;  Laterality: Left;  . TUBAL LIGATION    . VIDEO BRONCHOSCOPY WITH ENDOBRONCHIAL ULTRASOUND N/A 07/11/2016   Procedure: VIDEO BRONCHOSCOPY WITH ENDOBRONCHIAL ULTRASOUND;  Surgeon: Marshell Garfinkel, MD;  Location: [place];  Service: Pulmonary;  Laterality: N/A;    Social History   Socioeconomic History  . Marital status: Married    Spouse name: Not on file  . Number of children: Not on file  . Years of education: Not on file  . Highest education level: Not on file  Occupational History  . Occupation: retired    Fish farm manager: RETIRED  Social Needs  .  Financial resource strain: Not on file  . Food insecurity:    Worry: Not on file    Inability: Not on file  . Transportation needs:    Medical: Not on file    Non-medical: Not on file  Tobacco Use  . Smoking status: Never Smoker  . Smokeless tobacco: Never Used  Substance and Sexual Activity  . Alcohol use: No    Alcohol/week: 0.0 standard drinks  . Drug use: No  . Sexual activity: Not Currently  Lifestyle  . Physical activity:    Days per week: Not on file    Minutes per session: Not on file  . Stress: Not on file  Relationships  . Social connections:      Talks on phone: Not on file    Gets together: Not on file    Attends religious service: Not on file    Active member of club or organization: Not on file    Attends meetings of clubs or organizations: Not on file    Relationship status: Not on file  . Intimate partner violence:    Fear of current or ex partner: Not on file    Emotionally abused: Not on file    Physically abused: Not on file    Forced sexual activity: Not on file  Other Topics Concern  . Not on file  Social History Narrative   Regular exercise- Yes    Current Outpatient Medications on File Prior to Visit  Medication Sig Dispense Refill  . COLLAGEN PO Take 1 tablet by mouth daily.     [name] diltiazem (CARDIZEM CD) 360 MG 24 hr capsule Take 1 capsule (360 mg total) by mouth daily. 90 capsule 3  . furosemide (LASIX) 20 MG tablet Take 1 tablet (20 mg total) by mouth daily as needed (For swelling). 30 tablet 0  . gabapentin (NEURONTIN) 100 MG capsule Take 1 capsule (100 mg total) by mouth at bedtime. 30 capsule 3  . glucose blood (ACCU-CHEK GUIDE) test strip 1 each by Other route daily. Use to check blood sugars daily DX code: E11.8 100 each 3  . glucose blood (COOL BLOOD GLUCOSE TEST STRIPS) test strip Use as instructed to check blood sugar twice daily. DX: E11.9 200 each 3  . Lancets (ACCU-CHEK SOFT TOUCH) lancets Use to check blood sugars daily Dx E11.9 100 each 3  . metFORMIN (GLUCOPHAGE-XR) 500 MG 24 hr tablet Take 2 tablets (1,000 mg total) by mouth daily with breakfast. 180 tablet 3  . Vitamin D, Ergocalciferol, (DRISDOL) 1.25 MG (50000 UT) CAPS capsule Take 1 capsule (50,000 Units total) by mouth every 7 (seven) days. 8 capsule 0  . azithromycin (ZITHROMAX Z-PAK) 250 MG tablet Take 2 tablets on day 1 and then 1 tablet daily until complete. (Patient not taking: Reported on 02/07/2018) 6 each 0  . benzonatate (TESSALON) 100 MG capsule TAKE 2 CAPSULES BY MOUTH THREE TIMES DAILY AS NEEDED FOR COUGH (Patient not taking:  Reported on 02/07/2018) 180 capsule 0  . guaiFENesin-codeine 100-10 MG/5ML syrup Take 5 mLs by mouth every 6 (six) hours as needed for cough. (Patient not taking: Reported on 02/07/2018) 240 mL 0   No current facility-administered medications on file prior to visit.     Allergies  Allergen Reactions  . No Known Allergies     Family History  Problem Relation Age of Onset  . Heart attack Father   . Heart disease Father   . Arthritis Other   . Cancer Other  colon, lst degree relative  . Diabetes Other        st degree relative  . Hyperlipidemia Other   . Hypertension Other   . Colon cancer Neg Hx     BP 134/64 (BP Location: Left Arm, Patient Position: Sitting, Cuff Size: Normal)   Pulse 62   Ht 5' 5"  (1.651 m)   Wt 186 lb 12.8 oz (84.7 kg)   SpO2 93%   BMI 31.09 kg/m    Review of Systems She has gained weight.     Objective:   Physical Exam VITAL SIGNS:  See vs page GENERAL: no distress Pulses: dorsalis pedis intact bilat.   MSK: no deformity of the feet CV: 1+ bilat leg edema, and bilat vv's Skin:  no ulcer on the feet.  normal color and temp on the feet. Neuro: sensation is intact to touch on the feet    Lab Results  Component Value Date   HGBA1C 8.6 (A) 02/07/2018   Lab Results  Component Value Date   CREATININE 1.14 (H) 01/08/2018   BUN 23 01/08/2018   NA 139 01/08/2018   K 3.7 01/08/2018   CL 103 01/08/2018   CO2 26 01/08/2018       Assessment & Plan:  Type 2 DM, with renal insuff: worse Back pain, new to me.  Steroid rx is affecting a1c and weight Edema: this limits rx options.  Patient Instructions  Please continue the same metformin.   I have sent a prescription to your pharmacy, to add "Januvia."  Please come back for a follow-up appointment in 2 months.  check your blood sugar once a day.  vary the time of day when you check, between before the 3 meals, and at bedtime.  also check if you have symptoms of your blood sugar being too  high or too low.  please keep a record of the readings and bring it to your next appointment here (or you can bring the meter itself).  You can write it on any piece of paper.  please call us sooner if your blood sugar goes below 70, or if you have a lot of readings over 200.

## 2018-02-08 ENCOUNTER — Other Ambulatory Visit: Payer: Self-pay | Admitting: Internal Medicine

## 2018-02-08 DIAGNOSIS — E118 Type 2 diabetes mellitus with unspecified complications: Secondary | ICD-10-CM

## 2018-02-08 DIAGNOSIS — I10 Essential (primary) hypertension: Secondary | ICD-10-CM

## 2018-02-16 DIAGNOSIS — H40013 Open angle with borderline findings, low risk, bilateral: Secondary | ICD-10-CM | POA: Diagnosis not present

## 2018-02-16 DIAGNOSIS — H2513 Age-related nuclear cataract, bilateral: Secondary | ICD-10-CM | POA: Diagnosis not present

## 2018-02-16 DIAGNOSIS — E119 Type 2 diabetes mellitus without complications: Secondary | ICD-10-CM | POA: Diagnosis not present

## 2018-02-21 ENCOUNTER — Encounter: Payer: Self-pay | Admitting: Family Medicine

## 2018-02-21 ENCOUNTER — Ambulatory Visit: Payer: Medicare Other | Admitting: Family Medicine

## 2018-02-21 DIAGNOSIS — M48061 Spinal stenosis, lumbar region without neurogenic claudication: Secondary | ICD-10-CM | POA: Diagnosis not present

## 2018-02-21 NOTE — Assessment & Plan Note (Signed)
Responded well to an epidural previously.  Discussed icing regimen and home exercise.  Discussed which activities to do which wants to avoid.  Patient is to increase activity slowly.  Patient declined any type of formal physical therapy secondary to patient being the primary caregiver of her husband who is on hospice.  Sinus patient is well she can follow-up with me as needed

## 2018-02-21 NOTE — Progress Notes (Signed)
[name] [address] [place], [place] [address] Phone: [phone] Subjective:    I Nicole [name] am serving as a Education administrator for Dr. Hulan Saas.   I'm seeing this patient by the request  of:    CC: Back pain follow-up  LPF:[id number]  Nicole [name] is a 78 y.o. female coming in with complaint of neck pain follow-up.  Does have known severe L4-L5 spinal stenosis.  Patient was sent for an epidural and had this done on January 31, 2018. Patient states she is doing well.  Patient states that the pain is approximately 70 to 80% better.  Patient states also the leg pain is improved.  Able to walk on her own on a more regular basis.  Patient is the primary caregiver for her husband who is now recently been put on hospice.    Patient is an MRI showing a moderate to severe L4-L5 spinal stenosis.  Past Medical History:  Diagnosis Date  . A-fib ([place])   . Adenocarcinoma of right lung, stage 3 ([place]) 07/28/2016  . Atrial flutter ([place]) 10/05/2016  . Bronchitis    hx of  . Cancer [facility]) 2004   uterine/cervical  . Cancer-related pain 10/06/2016  . Coronary artery disease 02/21/2017   JAN 2019 Prox RCA lesion is 25% stenosed. Prox Cx lesion is 30% stenosed. Mid Cx lesion is 25% stenosed. Ost 3rd Mrg lesion is 30% stenosed. Prox LAD lesion is 25% stenosed. Mid LAD lesion is 25% stenosed. The left ventricular systolic function is normal. LV end diastolic pressure is normal. The left ventricular ejection fraction is 55-65% by visual estimate. There is no aortic valve stenosis.   N  . Diabetes mellitus    type 2  . Edema 07/12/2017  . Essential hypertension 01/20/2009  . Gallstones   . Headache   . History of radiation therapy 08/09/2016 to 09/19/2016   Right lung was treated to 60 Gy in 30 fractions at 2 Gy per fraction  . Hypertension   . Hypertriglyceridemia 08/22/2012  . Low back pain   . Lung mass    with lymphadenopathy  . Osteoarthritis   . Type II diabetes  mellitus with manifestations ([place]) 01/20/2009   Estimated Creatinine Clearance: 49.8 mL/min (by C-G formula based on SCr of 1 mg/dL).   Past Surgical History:  Procedure Laterality Date  . ABDOMINAL HYSTERECTOMY  2004  . APPENDECTOMY  when 78 years old  . COLONOSCOPY  08-05-09   [name]  . INCONTINENCE SURGERY    . KNEE ARTHROSCOPY Left   . LEFT HEART CATH AND CORONARY ANGIOGRAPHY N/A 02/24/2017   Procedure: LEFT HEART CATH AND CORONARY ANGIOGRAPHY;  Surgeon: Jettie Booze, MD;  Location: [place] CV LAB;  Service: Cardiovascular;  Laterality: N/A;  . LEFT HEART CATHETERIZATION WITH CORONARY ANGIOGRAM N/A 07/19/2013   Procedure: LEFT HEART CATHETERIZATION WITH CORONARY ANGIOGRAM;  Surgeon: Sinclair Grooms, MD;  Location: [facility] CATH LAB;  Service: Cardiovascular;  Laterality: N/A;  . POLYPECTOMY  08-05-09   2 polyps  . TOTAL KNEE ARTHROPLASTY Left 03/28/2012   Procedure: LEFT TOTAL KNEE ARTHROPLASTY;  Surgeon: Tobi Bastos, MD;  Location: [facility];  Service: Orthopedics;  Laterality: Left;  . TUBAL LIGATION    . VIDEO BRONCHOSCOPY WITH ENDOBRONCHIAL ULTRASOUND N/A 07/11/2016   Procedure: VIDEO BRONCHOSCOPY WITH ENDOBRONCHIAL ULTRASOUND;  Surgeon: Marshell Garfinkel, MD;  Location: [place];  Service: Pulmonary;  Laterality: N/A;   Social History   Socioeconomic History  .  Marital status: Married    Spouse name: Not on file  . Number of children: Not on file  . Years of education: Not on file  . Highest education level: Not on file  Occupational History  . Occupation: retired    Fish farm manager: RETIRED  Social Needs  . Financial resource strain: Not on file  . Food insecurity:    Worry: Not on file    Inability: Not on file  . Transportation needs:    Medical: Not on file    Non-medical: Not on file  Tobacco Use  . Smoking status: Never Smoker  . Smokeless tobacco: Never Used  Substance and Sexual Activity  . Alcohol use: No    Alcohol/week: 0.0 standard drinks  . Drug use: No    . Sexual activity: Not Currently  Lifestyle  . Physical activity:    Days per week: Not on file    Minutes per session: Not on file  . Stress: Not on file  Relationships  . Social connections:    Talks on phone: Not on file    Gets together: Not on file    Attends religious service: Not on file    Active member of club or organization: Not on file    Attends meetings of clubs or organizations: Not on file    Relationship status: Not on file  Other Topics Concern  . Not on file  Social History Narrative   Regular exercise- Yes   Allergies  Allergen Reactions  . No Known Allergies    Family History  Problem Relation Age of Onset  . Heart attack Father   . Heart disease Father   . Arthritis Other   . Cancer Other        colon, lst degree relative  . Diabetes Other        st degree relative  . Hyperlipidemia Other   . Hypertension Other   . Colon cancer Neg Hx     Current Outpatient Medications (Endocrine & Metabolic):  .  metFORMIN (GLUCOPHAGE-XR) 500 MG 24 hr tablet, Take 2 tablets (1,000 mg total) by mouth daily with breakfast. .  sitaGLIPtin (JANUVIA) 100 MG tablet, Take 1 tablet (100 mg total) by mouth daily.  Current Outpatient Medications (Cardiovascular):  .  diltiazem (CARDIZEM CD) 360 MG 24 hr capsule, Take 1 capsule (360 mg total) by mouth daily. .  furosemide (LASIX) 20 MG tablet, Take 1 tablet (20 mg total) by mouth daily as needed (For swelling). .  irbesartan (AVAPRO) 300 MG tablet, TAKE 1 TABLET BY MOUTH ONCE DAILY  Current Outpatient Medications (Respiratory):  .  benzonatate (TESSALON) 100 MG capsule, TAKE 2 CAPSULES BY MOUTH THREE TIMES DAILY AS NEEDED FOR COUGH .  guaiFENesin-codeine 100-10 MG/5ML syrup, Take 5 mLs by mouth every 6 (six) hours as needed for cough.    Current Outpatient Medications (Other):  .  azithromycin (ZITHROMAX Z-PAK) 250 MG tablet, Take 2 tablets on day 1 and then 1 tablet daily until complete. .  COLLAGEN PO, Take 1  tablet by mouth daily.  [name]  gabapentin (NEURONTIN) 100 MG capsule, Take 1 capsule (100 mg total) by mouth at bedtime. [name]  glucose blood (ACCU-CHEK GUIDE) test strip, 1 each by Other route daily. Use to check blood sugars daily DX code: E11.8 .  glucose blood (COOL BLOOD GLUCOSE TEST STRIPS) test strip, Use as instructed to check blood sugar twice daily. DX: E11.9 .  Lancets (ACCU-CHEK SOFT TOUCH) lancets, Use to check blood sugars  daily Dx E11.9 .  pregabalin (LYRICA) 75 MG capsule, Take 75 mg by mouth 2 (two) times daily. .  silver sulfADIAZINE (SILVADENE) 1 % cream, Apply 1 application topically daily. .  valACYclovir (VALTREX) 1000 MG tablet, Take 1,000 mg by mouth 2 (two) times daily. .  Vitamin D, Ergocalciferol, (DRISDOL) 1.25 MG (50000 UT) CAPS capsule, Take 1 capsule (50,000 Units total) by mouth every 7 (seven) days.    Past medical history, social, surgical and family history all reviewed in electronic medical record.  No pertanent information unless stated regarding to the chief complaint.   Review of Systems:  No headache, visual changes, nausea, vomiting, diarrhea, constipation, dizziness, abdominal pain, skin rash, fevers, chills, night sweats, weight loss, swollen lymph nodes, body aches, joint swelling, , chest pain, shortness of breath, mood changes.  Positive muscle aches  Objective  Blood pressure 130/70, pulse 85, height _0  (1.651 m), weight 186 lb (84.4 kg), SpO2 97 %.   General: No apparent distress alert and oriented x3 mood and affect normal, dressed appropriately.  HEENT: Pupils equal, extraocular movements intact  Respiratory: Patient's speak in full sentences and does not appear short of breath  Abdomen: Soft nontender  Neuro: Cranial nerves II through XII are intact, neurovascularly intact in all extremities with 2+ DTRs and 2+ pulses.  Lymph: No lymphadenopathy of posterior or anterior cervical chain or axillae bilaterally.  Gait normal with good balance and  coordination.  MSK:  Non tender with full range of motion and good stability and symmetric strength and tone of shoulders, elbows, wrist, hip, knee and ankles bilaterally.  Back Exam:  Inspection: Mild loss of lordosis Motion: Flexion .30 deg, Extension 15 deg, Side Bending to 15 deg bilaterally,  Rotation to 15 deg bilaterally  SLR laying: Tightness bilaterally XSLR laying: Negative  Palpable tenderness: Tender to palpation of the paraspinal musculature still noted.[name] FABER: Unable to do secondary to tightness. Sensory change: Gross sensation intact to all lumbar and sacral dermatomes.  Reflexes: 2+ at both patellar tendons, 2+ at achilles tendons, Babinski's downgoing.  Strength at foot  4/5 but symmetric e.   Impression and Recommendations:     This case required medical decision making of moderate complexity. The above documentation has been reviewed and is accurate and complete Lyndal Pulley, DO       Note: This dictation was prepared with Dragon dictation along with smaller phrase technology. Any transcriptional errors that result from this process are unintentional.

## 2018-02-21 NOTE — Patient Instructions (Signed)
Great to see you  I am so happy you are feeling better I am here if you  Need me  Call us at [phone] when you do need [place]

## 2018-02-28 ENCOUNTER — Telehealth: Payer: Self-pay | Admitting: Medical Oncology

## 2018-02-28 NOTE — Telephone Encounter (Signed)
New sore throat and pain left side . Recent shingles diagnosis . I referred her to her PCP.

## 2018-03-01 NOTE — Telephone Encounter (Signed)
Ask her if she wants to be seen today

## 2018-03-01 NOTE — Telephone Encounter (Signed)
LVM for patient to call back and make an appointment with Dr.Jones today. He has a 130p opening.

## 2018-03-06 ENCOUNTER — Other Ambulatory Visit: Payer: Self-pay

## 2018-03-06 MED ORDER — VITAMIN D (ERGOCALCIFEROL) 1.25 MG (50000 UNIT) PO CAPS: 50000.0000 [IU] | ORAL_CAPSULE | ORAL | 0 refills | Status: DC

## 2018-03-07 ENCOUNTER — Ambulatory Visit: Payer: Medicare Other | Admitting: Internal Medicine

## 2018-03-07 ENCOUNTER — Encounter: Payer: Self-pay | Admitting: Internal Medicine

## 2018-03-07 VITALS — BP 140/78 | HR 90 | Temp 97.4°F | Resp 16 | Ht 65.0 in | Wt 186.0 lb

## 2018-03-07 DIAGNOSIS — J988 Other specified respiratory disorders: Secondary | ICD-10-CM | POA: Diagnosis not present

## 2018-03-07 DIAGNOSIS — J189 Pneumonia, unspecified organism: Secondary | ICD-10-CM | POA: Insufficient documentation

## 2018-03-07 MED ORDER — AMOXICILLIN-POT CLAVULANATE 875-125 MG PO TABS: 1.0000 | ORAL_TABLET | Freq: Two times a day (BID) | ORAL | 0 refills | Status: AC

## 2018-03-07 NOTE — Progress Notes (Signed)
Subjective:  Patient ID: Nicole [name], female    DOB: 1940/07/29  Age: 78 y.o. MRN: [id number]  CC: URI   HPI Nicole [name] presents for a 67-month history of cough and runny nose that are productive of thick yellow phlegm.  She saw someone else a few weeks ago and took a Zithromax course but says it did not control her symptoms.  Outpatient Medications Prior to Visit  Medication Sig Dispense Refill  . benzonatate (TESSALON) 100 MG capsule TAKE 2 CAPSULES BY MOUTH THREE TIMES DAILY AS NEEDED FOR COUGH 180 capsule 0  . COLLAGEN PO Take 1 tablet by mouth daily.     [name] diltiazem (CARDIZEM CD) 360 MG 24 hr capsule Take 1 capsule (360 mg total) by mouth daily. 90 capsule 3  . furosemide (LASIX) 20 MG tablet Take 1 tablet (20 mg total) by mouth daily as needed (For swelling). 30 tablet 0  . gabapentin (NEURONTIN) 100 MG capsule Take 1 capsule (100 mg total) by mouth at bedtime. 30 capsule 3  . glucose blood (ACCU-CHEK GUIDE) test strip 1 each by Other route daily. Use to check blood sugars daily DX code: E11.8 100 each 3  . glucose blood (COOL BLOOD GLUCOSE TEST STRIPS) test strip Use as instructed to check blood sugar twice daily. DX: E11.9 200 each 3  . irbesartan (AVAPRO) 300 MG tablet TAKE 1 TABLET BY MOUTH ONCE DAILY 90 tablet 1  . Lancets (ACCU-CHEK SOFT TOUCH) lancets Use to check blood sugars daily Dx E11.9 100 each 3  . metFORMIN (GLUCOPHAGE-XR) 500 MG 24 hr tablet Take 2 tablets (1,000 mg total) by mouth daily with breakfast. 180 tablet 3  . pregabalin (LYRICA) 75 MG capsule Take 75 mg by mouth 2 (two) times daily.    . silver sulfADIAZINE (SILVADENE) 1 % cream Apply 1 application topically daily.    . sitaGLIPtin (JANUVIA) 100 MG tablet Take 1 tablet (100 mg total) by mouth daily. 30 tablet 11  . Vitamin D, Ergocalciferol, (DRISDOL) 1.25 MG (50000 UT) CAPS capsule Take 1 capsule (50,000 Units total) by mouth every 7 (seven) days. 8 capsule 0  . guaiFENesin-codeine 100-10 MG/5ML  syrup Take 5 mLs by mouth every 6 (six) hours as needed for cough. 240 mL 0  . valACYclovir (VALTREX) 1000 MG tablet Take 1,000 mg by mouth 2 (two) times daily.    [name] azithromycin (ZITHROMAX Z-PAK) 250 MG tablet Take 2 tablets on day 1 and then 1 tablet daily until complete. 6 each 0   No facility-administered medications prior to visit.     ROS Review of Systems  Constitutional: Positive for fatigue. Negative for chills and fever.  HENT: Positive for congestion, postnasal drip, rhinorrhea, sinus pressure and sinus pain. Negative for facial swelling, sore throat and trouble swallowing.   Respiratory: Positive for cough. Negative for chest tightness, shortness of breath and wheezing.   Cardiovascular: Negative for chest pain, palpitations and leg swelling.  Gastrointestinal: Negative for abdominal pain, constipation, diarrhea, nausea and vomiting.  Genitourinary: Negative.  Negative for difficulty urinating, dysuria and hematuria.  Musculoskeletal: Negative.  Negative for arthralgias, myalgias and neck pain.  Skin: Negative for color change and rash.  Neurological: Negative.  Negative for dizziness, weakness, light-headedness and headaches.  Hematological: Negative for adenopathy. Does not bruise/bleed easily.  Psychiatric/Behavioral: Negative.     Objective:  BP 140/78 (BP Location: Left Arm, Patient Position: Sitting, Cuff Size: Normal)   Pulse 90   Temp (!) 97.4 F (36.3 C) (  Oral)   Resp 16   Ht 5\' 5"  (1.651 m)   Wt 186 lb (84.4 kg)   SpO2 97%   BMI 30.95 kg/m   BP Readings from Last 3 Encounters:  03/07/18 140/78  02/21/18 130/70  02/07/18 134/64    Wt Readings from Last 3 Encounters:  03/07/18 186 lb (84.4 kg)  02/21/18 186 lb (84.4 kg)  02/07/18 186 lb 12.8 oz (84.7 kg)    Physical Exam Vitals signs reviewed.  Constitutional:      Appearance: She is not ill-appearing or diaphoretic.  HENT:     Head:     Salivary Glands: Right salivary gland is not diffusely  enlarged or tender. Left salivary gland is tender. Left salivary gland is not diffusely enlarged.     Nose: Nose normal. No congestion or rhinorrhea.     Mouth/Throat:     Mouth: Mucous membranes are moist.     Pharynx: Oropharynx is clear. No oropharyngeal exudate.  Eyes:     General: No scleral icterus.    Conjunctiva/sclera: Conjunctivae normal.  Neck:     Musculoskeletal: Normal range of motion and neck supple. No neck rigidity or muscular tenderness.  Cardiovascular:     Rate and Rhythm: Normal rate and regular rhythm.     Heart sounds: No murmur. No gallop.   Pulmonary:     Effort: Pulmonary effort is normal.     Breath sounds: No stridor. Examination of the right-lower field reveals rales. Examination of the left-lower field reveals rales. Rales present. No wheezing or rhonchi.  Abdominal:     General: Abdomen is flat.     Palpations: There is no mass.     Tenderness: There is no abdominal tenderness. There is no guarding.     Hernia: No hernia is present.  Musculoskeletal: Normal range of motion.        General: No swelling.     Right lower leg: No edema.     Left lower leg: No edema.  Lymphadenopathy:     Cervical: No cervical adenopathy.  Skin:    General: Skin is warm and dry.     Coloration: Skin is not pale.     Findings: No erythema or rash.  Neurological:     General: No focal deficit present.     Mental Status: She is oriented to person, place, and time. Mental status is at baseline.     Lab Results  Component Value Date   WBC 5.4 01/08/2018   HGB 12.7 01/08/2018   HCT 38.4 01/08/2018   PLT 214 01/08/2018   GLUCOSE 293 (H) 01/08/2018   CHOL 203 (H) 02/02/2017   TRIG 147.0 02/02/2017   HDL 61.10 02/02/2017   LDLDIRECT 74.0 06/14/2016   LDLCALC 113 (H) 02/02/2017   ALT 17 01/08/2018   AST 13 (L) 01/08/2018   NA 139 01/08/2018   K 3.7 01/08/2018   CL 103 01/08/2018   CREATININE 1.14 (H) 01/08/2018   BUN 23 01/08/2018   CO2 26 01/08/2018   TSH 0.68  11/15/2017   INR 1.0 02/21/2017   HGBA1C 8.6 (A) 02/07/2018   MICROALBUR 80 08/03/2017    Dg Inject Diag/thera/inc Needle/cath/plc Epi/lumb/sac W/img  Result Date: 01/31/2018 CLINICAL DATA:  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 Right lower extremity radiculopathy. FLUOROSCOPY TIME:  Radiation Exposure Index (as provided by the fluoroscopic device): 27.40 uGy*m2 Fluoroscopy Time:  30 seconds Number of Acquired Images:  0 PROCEDURE: The procedure, risks, benefits, and alternatives  were explained to the patient. Questions regarding the procedure were encouraged and answered. The patient understands and consents to the procedure. LUMBAR EPIDURAL INJECTION: An interlaminar approach was performed on right at L4-5. The overlying skin was cleansed and anesthetized. A 20 gauge epidural needle was advanced using loss-of-resistance technique. DIAGNOSTIC EPIDURAL INJECTION: Injection of Isovue-M 200 shows a good epidural pattern with spread above and below the level of needle placement, no vascular opacification is seen. THERAPEUTIC EPIDURAL INJECTION: 120 mg of Depo-Medrol mixed with 3 mL 1% lidocaine were instilled. The procedure was well-tolerated, and the patient was discharged thirty minutes following the injection in good condition. COMPLICATIONS: None IMPRESSION: Technically successful epidural injection on the right L4-5 # 1 Electronically Signed   By: San Morelle M.D.   On: 01/31/2018 11:49    Assessment & Plan:   Alanee was seen today for uri.  Diagnoses and all orders for this visit:  RTI (respiratory tract infection)- I will treat for bacterial sinusitis and pneumonia with a course of Augmentin. -     amoxicillin-clavulanate (AUGMENTIN) 875-125 MG tablet; Take 1 tablet by mouth 2 (two) times daily for 10 days.   I have discontinued Chrishonda K. Auvil's azithromycin, guaiFENesin-codeine, and valACYclovir. I am also having her start on  amoxicillin-clavulanate. Additionally, I am having her maintain her COLLAGEN PO, glucose blood, accu-chek soft touch, metFORMIN, diltiazem, glucose blood, benzonatate, furosemide, gabapentin, sitaGLIPtin, irbesartan, pregabalin, silver sulfADIAZINE, and Vitamin D (Ergocalciferol).  Meds ordered this encounter  Medications  . amoxicillin-clavulanate (AUGMENTIN) 875-125 MG tablet    Sig: Take 1 tablet by mouth 2 (two) times daily for 10 days.    Dispense:  20 tablet    Refill:  0     Follow-up: Return if symptoms worsen or fail to improve.  Scarlette Calico, MD

## 2018-03-07 NOTE — Patient Instructions (Signed)
Sinusitis, Adult  Sinusitis is inflammation of your sinuses. Sinuses are hollow spaces in the bones around your face. Your sinuses are located:   Around your eyes.   In the middle of your forehead.   Behind your nose.   In your cheekbones.  Mucus normally drains out of your sinuses. When your nasal tissues become inflamed or swollen, mucus can become trapped or blocked. This allows bacteria, viruses, and fungi to grow, which leads to infection. Most infections of the sinuses are caused by a virus.  Sinusitis can develop quickly. It can last for up to 4 weeks (acute) or for more than 12 weeks (chronic). Sinusitis often develops after a cold.  What are the causes?  This condition is caused by anything that creates swelling in the sinuses or stops mucus from draining. This includes:   Allergies.   Asthma.   Infection from bacteria or viruses.   Deformities or blockages in your nose or sinuses.   Abnormal growths in the nose (nasal polyps).   Pollutants, such as chemicals or irritants in the air.   Infection from fungi (rare).  What increases the risk?  You are more likely to develop this condition if you:   Have a weak body defense system (immune system).   Do a lot of swimming or diving.   Overuse nasal sprays.   Smoke.  What are the signs or symptoms?  The main symptoms of this condition are pain and a feeling of pressure around the affected sinuses. Other symptoms include:   Stuffy nose or congestion.   Thick drainage from your nose.   Swelling and warmth over the affected sinuses.   Headache.   Upper toothache.   A cough that may get worse at night.   Extra mucus that collects in the throat or the back of the nose (postnasal drip).   Decreased sense of smell and taste.   Fatigue.   A fever.   Sore throat.   Bad breath.  How is this diagnosed?  This condition is diagnosed based on:   Your symptoms.   Your medical history.   A physical exam.   Tests to find out if your condition is  acute or chronic. This may include:  ? Checking your nose for nasal polyps.  ? Viewing your sinuses using a device that has a light (endoscope).  ? Testing for allergies or bacteria.  ? Imaging tests, such as an MRI or CT scan.  In rare cases, a bone biopsy may be done to rule out more serious types of fungal sinus disease.  How is this treated?  Treatment for sinusitis depends on the cause and whether your condition is chronic or acute.   If caused by a virus, your symptoms should go away on their own within 10 days. You may be given medicines to relieve symptoms. They include:  ? Medicines that shrink swollen nasal passages (topical intranasal decongestants).  ? Medicines that treat allergies (antihistamines).  ? A spray that eases inflammation of the nostrils (topical intranasal corticosteroids).  ? Rinses that help get rid of thick mucus in your nose (nasal saline washes).   If caused by bacteria, your health care provider may recommend waiting to see if your symptoms improve. Most bacterial infections will get better without antibiotic medicine. You may be given antibiotics if you have:  ? A severe infection.  ? A weak immune system.   If caused by narrow nasal passages or nasal polyps, you may need   to have surgery.  Follow these instructions at home:  Medicines   Take, use, or apply over-the-counter and prescription medicines only as told by your health care provider. These may include nasal sprays.   If you were prescribed an antibiotic medicine, take it as told by your health care provider. Do not stop taking the antibiotic even if you start to feel better.  Hydrate and humidify     Drink enough fluid to keep your urine pale yellow. Staying hydrated will help to thin your mucus.   Use a cool mist humidifier to keep the humidity level in your home above 50%.   Inhale steam for 10-15 minutes, 3-4 times a day, or as told by your health care provider. You can do this in the bathroom while a hot shower is  running.   Limit your exposure to cool or dry air.  Rest   Rest as much as possible.   Sleep with your head raised (elevated).   Make sure you get enough sleep each night.  General instructions     Apply a warm, moist washcloth to your face 3-4 times a day or as told by your health care provider. This will help with discomfort.   Wash your hands often with soap and water to reduce your exposure to germs. If soap and water are not available, use hand sanitizer.   Do not smoke. Avoid being around people who are smoking (secondhand smoke).   Keep all follow-up visits as told by your health care provider. This is important.  Contact a health care provider if:   You have a fever.   Your symptoms get worse.   Your symptoms do not improve within 10 days.  Get help right away if:   You have a severe headache.   You have persistent vomiting.   You have severe pain or swelling around your face or eyes.   You have vision problems.   You develop confusion.   Your neck is stiff.   You have trouble breathing.  Summary   Sinusitis is soreness and inflammation of your sinuses. Sinuses are hollow spaces in the bones around your face.   This condition is caused by nasal tissues that become inflamed or swollen. The swelling traps or blocks the flow of mucus. This allows bacteria, viruses, and fungi to grow, which leads to infection.   If you were prescribed an antibiotic medicine, take it as told by your health care provider. Do not stop taking the antibiotic even if you start to feel better.   Keep all follow-up visits as told by your health care provider. This is important.  This information is not intended to replace advice given to you by your health care provider. Make sure you discuss any questions you have with your health care provider.  Document Released: 01/10/2005 Document Revised: 06/12/2017 Document Reviewed: 06/12/2017  Elsevier Interactive Patient Education  2019 [employer]

## 2018-03-08 ENCOUNTER — Ambulatory Visit: Payer: Medicare Other | Admitting: Nurse Practitioner

## 2018-03-08 ENCOUNTER — Encounter: Payer: Self-pay | Admitting: Nurse Practitioner

## 2018-03-08 ENCOUNTER — Ambulatory Visit: Payer: Self-pay

## 2018-03-08 ENCOUNTER — Ambulatory Visit: Payer: Medicare Other | Admitting: Endocrinology

## 2018-03-08 ENCOUNTER — Other Ambulatory Visit (INDEPENDENT_AMBULATORY_CARE_PROVIDER_SITE_OTHER): Payer: Medicare Other

## 2018-03-08 VITALS — BP 160/80 | HR 90 | Temp 98.3°F | Ht 65.0 in | Wt 190.0 lb

## 2018-03-08 DIAGNOSIS — R059 Cough, unspecified: Secondary | ICD-10-CM

## 2018-03-08 DIAGNOSIS — R05 Cough: Secondary | ICD-10-CM | POA: Diagnosis not present

## 2018-03-08 DIAGNOSIS — R109 Unspecified abdominal pain: Secondary | ICD-10-CM | POA: Diagnosis not present

## 2018-03-08 DIAGNOSIS — B028 Zoster with other complications: Secondary | ICD-10-CM

## 2018-03-08 LAB — COMPREHENSIVE METABOLIC PANEL
ALT: 11 U/L (ref 0–35)
AST: 15 U/L (ref 0–37)
Albumin: 3.8 g/dL (ref 3.5–5.2)
Alkaline Phosphatase: 55 U/L (ref 39–117)
BUN: 15 mg/dL (ref 6–23)
CO2: 27 mEq/L (ref 19–32)
Calcium: 9.3 mg/dL (ref 8.4–10.5)
Chloride: 105 mEq/L (ref 96–112)
Creatinine, Ser: 0.96 mg/dL (ref 0.40–1.20)
GFR: 56.61 mL/min — ABNORMAL LOW (ref >60.00)
Glucose, Bld: 99 mg/dL (ref 70–99)
Potassium: 3.8 mEq/L (ref 3.5–5.1)
Sodium: 139 mEq/L (ref 135–145)
Total Bilirubin: 0.5 mg/dL (ref 0.2–1.2)
Total Protein: 7.2 g/dL (ref 6.0–8.3)

## 2018-03-08 LAB — CBC
HCT: 40.9 % (ref 36.0–46.0)
Hemoglobin: 13.8 g/dL (ref 12.0–15.0)
MCHC: 33.8 g/dL (ref 30.0–36.0)
MCV: 94.1 fl (ref 78.0–100.0)
Platelets: 319 10*3/uL (ref 150.0–400.0)
RBC: 4.35 Mil/uL (ref 3.87–5.11)
RDW: 14.5 % (ref 11.5–15.5)
WBC: 7 10*3/uL (ref 4.0–10.5)

## 2018-03-08 LAB — LIPASE: Lipase: 36 U/L (ref 11.0–59.0)

## 2018-03-08 LAB — AMYLASE: Amylase: 69 U/L (ref 27–131)

## 2018-03-08 NOTE — Telephone Encounter (Signed)
Pt. Reports she was seen yesterday with pneumonia. States she forgot to tell her PCP about her upper abdominal pain she has had for 1 week. Above her belly button and hurts all across her abdomen. Pain comes and goes. Hurts worse when she coughs.Left side is worse than right. States heat packs helped at first, but not anymore.No vomiting or diarrhea.No fever.No availability with Dr. Ronnald Ramp. Appointment made.  Reason for Disposition . [1] MODERATE pain (e.g., interferes with normal activities) AND [2] pain comes and goes (cramps) AND [3] present > 24 hours  (Exception: pain with Vomiting or Diarrhea - see that Guideline)  Answer Assessment - Initial Assessment Questions 1. LOCATION: "Where does it hurt?"      Epigastric pain - all across 2. RADIATION: "Does the pain shoot anywhere else?" (e.g., chest, back)       No 3. ONSET: "When did the pain begin?" (e.g., minutes, hours or days ago)      1 week ago 4. SUDDEN: "Gradual or sudden onset?"      Gradual 5. PATTERN "Does the pain come and go, or is it constant?"    - If constant: "Is it getting better, staying the same, or worsening?"      (Note: Constant means the pain never goes away completely; most serious pain is constant and it progresses)     - If intermittent: "How long does it last?" "Do you have pain now?"     (Note: Intermittent means the pain goes away completely between bouts)     Comes and goes 6. SEVERITY: "How bad is the pain?"  (e.g., Scale 1-10; mild, moderate, or severe)   - MILD (1-3): doesn't interfere with normal activities, abdomen soft and not tender to touch    - MODERATE (4-7): interferes with normal activities or awakens from sleep, tender to touch    - SEVERE (8-10): excruciating pain, doubled over, unable to do any normal activities        Left side is worse - 9 7. RECURRENT SYMPTOM: "Have you ever had this type of abdominal pain before?" If so, ask: "When was the last time?" and "What happened that time?"       No 8. CAUSE: "What do you think is causing the abdominal pain?"     Unsure 9. RELIEVING/AGGRAVATING FACTORS: "What makes it better or worse?" (e.g., movement, antacids, bowel movement)     Heat pack 10. OTHER SYMPTOMS: "Has there been any vomiting, diarrhea, constipation, or urine problems?"       No 11. PREGNANCY: "Is there any chance you are pregnant?" "When was your last menstrual period?"       No  Protocols used: ABDOMINAL PAIN - [facility]

## 2018-03-08 NOTE — Patient Instructions (Addendum)
Head downstairs for labs  I will let you know when I get your results back   Abdominal Pain, Adult  Many things can cause belly (abdominal) pain. Most times, belly pain is not dangerous. Many cases of belly pain can be watched and treated at home. Sometimes belly pain is serious, though. Your doctor will try to find the cause of your belly pain. Follow these instructions at home:  Take over-the-counter and prescription medicines only as told by your doctor. Do not take medicines that help you poop (laxatives) unless told to by your doctor.  Drink enough fluid to keep your pee (urine) clear or pale yellow.  Watch your belly pain for any changes.  Keep all follow-up visits as told by your doctor. This is important. Contact a doctor if:  Your belly pain changes or gets worse.  You are not hungry, or you lose weight without trying.  You are having trouble pooping (constipated) or have watery poop (diarrhea) for more than 2-3 days.  You have pain when you pee or poop.  Your belly pain wakes you up at night.  Your pain gets worse with meals, after eating, or with certain foods.  You are throwing up and cannot keep anything down.  You have a fever. Get help right away if:  Your pain does not go away as soon as your doctor says it should.  You cannot stop throwing up.  Your pain is only in areas of your belly, such as the right side or the left lower part of the belly.  You have bloody or black poop, or poop that looks like tar.  You have very bad pain, cramping, or bloating in your belly.  You have signs of not having enough fluid or water in your body (dehydration), such as: ? Dark pee, very little pee, or no pee. ? Cracked lips. ? Dry mouth. ? Sunken eyes. ? Sleepiness. ? Weakness. This information is not intended to replace advice given to you by your health care provider. Make sure you discuss any questions you have with your health care provider. Document Released:  06/29/2007 Document Revised: 07/31/2015 Document Reviewed: 06/24/2015 Elsevier Interactive Patient Education  2019 [employer].

## 2018-03-08 NOTE — Progress Notes (Signed)
Nicole [name] is a 78 y.o. female with the following history as recorded in EpicCare:  Patient Active Problem List   Diagnosis Date Noted  . RTI (respiratory tract infection) 03/07/2018  . Spinal stenosis at L4-L5 level 01/25/2018  . Arthritis of sacroiliac joint 01/11/2018  . Acute left flank pain 12/28/2017  . Edema 07/12/2017  . Coronary artery disease 02/21/2017  . Abnormal cardiovascular stress test 02/20/2017  . Pneumonitis, radiation ([place]) 01/26/2017  . Encounter for antineoplastic immunotherapy 10/12/2016  . Cancer-related pain 10/06/2016  . Atrial flutter ([place]) 10/05/2016  . Adenocarcinoma of right lung, stage 3 ([place]) 07/28/2016  . Encounter for antineoplastic chemotherapy 07/28/2016  . Pancreatic cyst 11/09/2015  . Routine general medical examination at a health care facility 06/30/2014  . Gout 01/18/2013  . Hypertriglyceridemia 08/22/2012  . Osteopenia 10/31/2011  . Type II diabetes mellitus with manifestations ([place]) 01/20/2009  . Essential hypertension 01/20/2009  . Osteoarthritis 01/20/2009    Current Outpatient Medications  Medication Sig Dispense Refill  . amoxicillin-clavulanate (AUGMENTIN) 875-125 MG tablet Take 1 tablet by mouth 2 (two) times daily for 10 days. 20 tablet 0  . benzonatate (TESSALON) 100 MG capsule TAKE 2 CAPSULES BY MOUTH THREE TIMES DAILY AS NEEDED FOR COUGH 180 capsule 0  . COLLAGEN PO Take 1 tablet by mouth daily.     [name] diltiazem (CARDIZEM CD) 360 MG 24 hr capsule Take 1 capsule (360 mg total) by mouth daily. 90 capsule 3  . furosemide (LASIX) 20 MG tablet Take 1 tablet (20 mg total) by mouth daily as needed (For swelling). 30 tablet 0  . glucose blood (ACCU-CHEK GUIDE) test strip 1 each by Other route daily. Use to check blood sugars daily DX code: E11.8 100 each 3  . glucose blood (COOL BLOOD GLUCOSE TEST STRIPS) test strip Use as instructed to check blood sugar twice daily. DX: E11.9 200 each 3  . irbesartan (AVAPRO) 300 MG tablet TAKE 1  TABLET BY MOUTH ONCE DAILY 90 tablet 1  . Lancets (ACCU-CHEK SOFT TOUCH) lancets Use to check blood sugars daily Dx E11.9 100 each 3  . metFORMIN (GLUCOPHAGE-XR) 500 MG 24 hr tablet Take 2 tablets (1,000 mg total) by mouth daily with breakfast. 180 tablet 3  . pregabalin (LYRICA) 75 MG capsule Take 75 mg by mouth 2 (two) times daily.    . silver sulfADIAZINE (SILVADENE) 1 % cream Apply 1 application topically daily.    . sitaGLIPtin (JANUVIA) 100 MG tablet Take 1 tablet (100 mg total) by mouth daily. 30 tablet 11  . gabapentin (NEURONTIN) 100 MG capsule Take 1 capsule (100 mg total) by mouth at bedtime. (Patient not taking: Reported on 03/08/2018) 30 capsule 3  . Vitamin D, Ergocalciferol, (DRISDOL) 1.25 MG (50000 UT) CAPS capsule Take 1 capsule (50,000 Units total) by mouth every 7 (seven) days. (Patient not taking: Reported on 03/08/2018) 8 capsule 0   No current facility-administered medications for this visit.     Allergies: No known allergies  Past Medical History:  Diagnosis Date  . A-fib ([place])   . Adenocarcinoma of right lung, stage 3 ([place]) 07/28/2016  . Atrial flutter ([place]) 10/05/2016  . Bronchitis    hx of  . 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 Cancer-related pain 10/06/2016  . Coronary artery disease 02/21/2017   JAN 2019 Prox RCA lesion is 25% stenosed. Prox Cx lesion is 30% stenosed. Mid Cx lesion is 25% stenosed. Ost 3rd Mrg lesion is 30% stenosed. Prox LAD lesion is  25% stenosed. Mid LAD lesion is 25% stenosed. The left ventricular systolic function is normal. LV end diastolic pressure is normal. The left ventricular ejection fraction is 55-65% by visual estimate. There is no aortic valve stenosis.   N  . Diabetes mellitus    type 2  . Edema 07/12/2017  . Essential hypertension 01/20/2009  . Gallstones   . Headache   . History of radiation therapy 08/09/2016 to 09/19/2016   Right lung was treated to 60 Gy in 30 fractions at 2 Gy per fraction  . Hypertension   .  Hypertriglyceridemia 08/22/2012  . Low back pain   . Lung mass    with lymphadenopathy  . Osteoarthritis   . Type II diabetes mellitus with manifestations ([place]) 01/20/2009   Estimated Creatinine Clearance: 49.8 mL/min (by C-G formula based on SCr of 1 mg/dL).    Past Surgical History:  Procedure Laterality Date  . ABDOMINAL HYSTERECTOMY  2004  . APPENDECTOMY  when 78 years old  . COLONOSCOPY  08-05-09   Nicole [name]  . INCONTINENCE SURGERY    . KNEE ARTHROSCOPY Left   . LEFT HEART CATH AND CORONARY ANGIOGRAPHY N/A 02/24/2017   Procedure: LEFT HEART CATH AND CORONARY ANGIOGRAPHY;  Surgeon: Nicole Booze, MD;  Location: [facility];  Service: Cardiovascular;  Laterality: N/A;  . LEFT HEART CATHETERIZATION WITH CORONARY ANGIOGRAM N/A 07/19/2013   Procedure: LEFT HEART CATHETERIZATION WITH CORONARY ANGIOGRAM;  Surgeon: Nicole Grooms, MD;  Location: [facility] C[facility];  Service: Cardiovascular;  Laterality: N/A;  . POLYPECTOMY  08-05-09   2 polyps  . TOTAL KNEE ARTHROPLASTY Left 03/28/2012   Procedure: LEFT TOTAL KNEE ARTHROPLASTY;  Surgeon: Nicole Bastos, MD;  Location: [facility];  Service: Orthopedics;  Laterality: Left;  . TUBAL LIGATION    . VIDEO BRONCHOSCOPY WITH ENDOBRONCHIAL ULTRASOUND N/A 07/11/2016   Procedure: VIDEO BRONCHOSCOPY WITH ENDOBRONCHIAL ULTRASOUND;  Surgeon: Nicole Garfinkel, MD;  Location: [facility];  Service: Pulmonary;  Laterality: N/A;    Family History  Problem Relation Age of Onset  . Heart attack Father   . Heart disease Father   . Arthritis Other   . Cancer Other        colon, lst degree relative  . Diabetes Other        st degree relative  . Hyperlipidemia Other   . Hypertension Other   . Colon cancer Neg Hx     Social History   Tobacco Use  . Smoking status: Never Smoker  . Smokeless tobacco: Never Used  Substance Use Topics  . Alcohol use: No    Alcohol/week: 0.0 standard drinks     Subjective:  Nicole [name] is here today requesting evaluation of  upper abdominal pain x 1 week, describes as "sharp stabbing" pain , which occurs when she moves, coughs. Of note, she was seen yesterday by her pcp and started on Augmentin for respiratory infection with about 2 month history of cough, says she forgot to mention her pain then. She tells me that she has been "coughing like crazy." She was also treated for shingles rash to abdomen at the beginning of January with an antiviral course which she has completed, this visit was at an urgent care. She says she feels her shingles pain "is better" since she finished the antiviral but has noted occasional 'nerve pain."   She denies weakness, syncope, fevers, chills, nausea, vomiting, bowel or bladder changes Able to tolerate oral intake normally She has tried heat which  helps the pain some, however as soon as she begins coughing she can feel the pain again  ROS- See HPI   Objective:  Vitals:   03/08/18 1300  BP: (!) 160/80  Pulse: 90  Temp: 98.3 F (36.8 C)  TempSrc: Oral  SpO2: 97%  Weight: 190 lb (86.2 kg)  Height: 5' 5" (1.651 m)    General: Well developed, well nourished, in no acute distress  Skin : Warm and dry.  Head: Normocephalic and atraumatic  Eyes: Sclera and conjunctiva clear; pupils round and reactive to light; extraocular movements intact  Oropharynx: Pink, supple. No suspicious lesions  Neck: Supple  Lungs: Respirations unlabored; mild wheezing to lower lung fields CVS exam: Normal rate and regular rhythm, S1 and S2 normal.  Abdomen: Soft; rotund; normoactive bowel sounds; no masses or hepatosplenomegaly; generalized upper abd tenderness- no rigidity or guarding Extremities: No edema, cyanosis, clubbing  Vessels: Symmetric bilaterally  Neurologic: Alert and oriented; speech intact; face symmetrical; moves all extremities well; CNII-XII intact without focal deficit  Psychiatric: Normal mood and affect.  Assessment:  1. Abdominal pain, unspecified abdominal location   2.  Herpes zoster with other complication   3. Cough     Plan:   Labs ordered for further evaluation Suspect abdominal pain could be related to muscle strain from coughing ?postherpetic neuralgia could also be a cause- we discussed trial of neurontin, she has a prescription for this for a sports medicine problem but says she has not tried and prefers not to take any new medications at this time Home management, red flags and return precautions including when to seek immediate/emergency care discussed and printed on AVS F/U with further recommendations pending lab results   No follow-ups on file.  Orders Placed This Encounter  Procedures  . CBC    Standing Status:   Future    Number of Occurrences:   1    Standing Expiration Date:   03/09/2019  . Comprehensive metabolic panel    Standing Status:   Future    Number of Occurrences:   1    Standing Expiration Date:   03/09/2019  . Lipase    Standing Status:   Future    Number of Occurrences:   1    Standing Expiration Date:   03/08/2019  . Amylase    Standing Status:   Future    Number of Occurrences:   1    Standing Expiration Date:   03/08/2019    Requested Prescriptions    No prescriptions requested or ordered in this encounter

## 2018-03-27 ENCOUNTER — Ambulatory Visit: Payer: Medicare Other | Admitting: Physician Assistant

## 2018-03-27 ENCOUNTER — Encounter: Payer: Self-pay | Admitting: Physician Assistant

## 2018-03-27 VITALS — BP 130/70 | HR 111 | Ht 65.0 in | Wt 183.0 lb

## 2018-03-27 DIAGNOSIS — I10 Essential (primary) hypertension: Secondary | ICD-10-CM

## 2018-03-27 DIAGNOSIS — I2510 Atherosclerotic heart disease of native coronary artery without angina pectoris: Secondary | ICD-10-CM

## 2018-03-27 DIAGNOSIS — R609 Edema, unspecified: Secondary | ICD-10-CM | POA: Diagnosis not present

## 2018-03-27 DIAGNOSIS — E781 Pure hyperglyceridemia: Secondary | ICD-10-CM

## 2018-03-27 DIAGNOSIS — I4892 Unspecified atrial flutter: Secondary | ICD-10-CM | POA: Diagnosis not present

## 2018-03-27 NOTE — Patient Instructions (Signed)
Medication Instructions:  Your physician recommends that you continue on your current medications as directed. Please refer to the Current Medication list given to you today.  If you need a refill on your cardiac medications before your next appointment, please call your pharmacy.   Lab work: None Ordered  If you have labs (blood work) drawn today and your tests are completely normal, you will receive your results only by: [name] MyChart Message (if you have MyChart) OR . A paper copy in the mail If you have any lab test that is abnormal or we need to change your treatment, we will call you to review the results.  Testing/Procedures: None ordered  Follow-Up: At [facility], you and your health needs are our priority.  As part of our continuing mission to provide you with exceptional heart care, we have created designated Provider Care Teams.  These Care Teams include your primary Cardiologist (physician) and Advanced Practice Providers (APPs -  Physician Assistants and Nurse Practitioners) who all work together to provide you with the care you need, when you need it. . You will need a follow up appointment in June 2020.  Please call our office 2 months in advance to schedule this appointment.  You may see Casandra Doffing, MD or one of the following Advanced Practice Providers on your designated Care Team:   . Lyda Jester, PA-C . Dayna Dunn, PA-C . Ermalinda Barrios, PA-C  Any Other Special Instructions Will Be Listed Below (If Applicable).

## 2018-03-27 NOTE — Progress Notes (Signed)
Cardiology Office Note    Date:  03/27/2018   ID:  Nicole [name] 1941-01-16, MRN [id number]  PCP:  Nicole Lima, MD  Cardiologist: Nicole Grooms, MD EPS: None  No chief complaint on file.   History of Present Illness:  Nicole [name] is a 78 y.o. female  with history of chronic atrial fibrillation on Eliquis, CAD with prior cath in 2015 demonstrating 50% RCA and circumflex and 80% first diagonal.  Patient was having worsening shortness of breath and nuclear study ordered by PCP LVEF 66% normal perfusion however intermediate risk secondary to elevated 3 times daily ratio of 1.44.  She also has history of lung cancer status post radiation and was felt she might have radiation pneumonitis and was treated with steroids which seemed to help her cough.  Cardiac catheterization 02/24/2017 showed nonobstructive CAD with 25% proximal RCA, 30% proximal circumflex, 25% mid circumflex, 30% ostial third marginal, 25% proximal and mid LAD, normal LVEF 55 to 65%, no aortic valve stenosis.  Aggressive medical therapy recommended   Patient had rectal bleeding on Eliquis 05/2017 that were felt secondary to hemorrhoids.  Colonoscopy also found diverticulosis.  Patient saw Dr. Irish Lack 12/27/2017 and was having more rectal bleeding on Eliquis and wanted to come off this.  She said she had atrial flutter in the setting of chemotherapy and knows when she gets it.  CHA2DS2-VASc equals 4 she was also having lower extremity edema and he did not feel she needed diuretics.  She was going to try different compression stockings.  She saw oncology later in December and they gave her Lasix 20 mg as needed for edema.  Patient comes in for f/u. Had a rough winter with pneumonia, shingles, pinched nerve.  Had severe pain this morning at 12 and took an herbal medicine in order to get here which helped.  Heart rate was elevated but she is in sinus rhythm.  She is feeling so much better.  Not taking Lasix for edema and  edema has improved from an herbal tea she is drinking.   Past Medical History:  Diagnosis Date  . A-fib ([place])   . Adenocarcinoma of right lung, stage 3 ([place]) 07/28/2016  . Atrial flutter ([place]) 10/05/2016  . Bronchitis    hx of  . 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 Cancer-related pain 10/06/2016  . Coronary artery disease 02/21/2017   JAN 2019 Prox RCA lesion is 25% stenosed. Prox Cx lesion is 30% stenosed. Mid Cx lesion is 25% stenosed. Ost 3rd Mrg lesion is 30% stenosed. Prox LAD lesion is 25% stenosed. Mid LAD lesion is 25% stenosed. The left ventricular systolic function is normal. LV end diastolic pressure is normal. The left ventricular ejection fraction is 55-65% by visual estimate. There is no aortic valve stenosis.   N  . Diabetes mellitus    type 2  . Edema 07/12/2017  . Essential hypertension 01/20/2009  . Gallstones   . Headache   . History of radiation therapy 08/09/2016 to 09/19/2016   Right lung was treated to 60 Gy in 30 fractions at 2 Gy per fraction  . Hypertension   . Hypertriglyceridemia 08/22/2012  . Low back pain   . Lung mass    with lymphadenopathy  . Osteoarthritis   . Type II diabetes mellitus with manifestations ([place]) 01/20/2009   Estimated Creatinine Clearance: 49.8 mL/min (by C-G formula based on SCr of 1 mg/dL).    Past Surgical History:  Procedure Laterality Date  .  ABDOMINAL HYSTERECTOMY  2004  . APPENDECTOMY  when 78 years old  . COLONOSCOPY  08-05-09   [name]  . INCONTINENCE SURGERY    . KNEE ARTHROSCOPY Left   . LEFT HEART CATH AND CORONARY ANGIOGRAPHY N/A 02/24/2017   Procedure: LEFT HEART CATH AND CORONARY ANGIOGRAPHY;  Surgeon: Jettie Booze, MD;  Location: [facility];  Service: Cardiovascular;  Laterality: N/A;  . LEFT HEART CATHETERIZATION WITH CORONARY ANGIOGRAM N/A 07/19/2013   Procedure: LEFT HEART CATHETERIZATION WITH CORONARY ANGIOGRAM;  Surgeon: Sinclair Grooms, MD;  Location: [facility] CATH LAB;  Service: Cardiovascular;   Laterality: N/A;  . POLYPECTOMY  08-05-09   2 polyps  . TOTAL KNEE ARTHROPLASTY Left 03/28/2012   Procedure: LEFT TOTAL KNEE ARTHROPLASTY;  Surgeon: Tobi Bastos, MD;  [facility];  Service: Orthopedics;  Laterality: Left;  . TUBAL LIGATION    . VIDEO BRONCHOSCOPY WITH ENDOBRONCHIAL ULTRASOUND N/A 07/11/2016   Procedure: VIDEO BRONCHOSCOPY WITH ENDOBRONCHIAL ULTRASOUND;  Surgeon: Marshell Garfinkel, MD;  Location: [facility];  Service: Pulmonary;  Laterality: N/A;    Current Medications: Current Meds  Medication Sig  . benzonatate (TESSALON) 100 MG capsule TAKE 2 CAPSULES BY MOUTH THREE TIMES DAILY AS NEEDED FOR COUGH  . COLLAGEN PO Take 1 tablet by mouth daily.   [name] diltiazem (CARDIZEM CD) 360 MG 24 hr capsule Take 1 capsule (360 mg total) by mouth daily.  [name] glucose blood (ACCU-CHEK GUIDE) test strip 1 each by Other route daily. Use to check blood sugars daily DX code: E11.8  . irbesartan (AVAPRO) 300 MG tablet TAKE 1 TABLET BY MOUTH ONCE DAILY  . Lancets (ACCU-CHEK SOFT TOUCH) lancets Use to check blood sugars daily Dx E11.9  . metFORMIN (GLUCOPHAGE-XR) 500 MG 24 hr tablet Take 2 tablets (1,000 mg total) by mouth daily with breakfast.  . silver sulfADIAZINE (SILVADENE) 1 % cream Apply 1 application topically daily.  . sitaGLIPtin (JANUVIA) 100 MG tablet Take 1 tablet (100 mg total) by mouth daily.     Allergies:   No known allergies   Social History   Socioeconomic History  . Marital status: Married    Spouse name: Not on file  . Number of children: Not on file  . Years of education: Not on file  . Highest education level: Not on file  Occupational History  . Occupation: retired    Fish farm manager: RETIRED  Social Needs  . Financial resource strain: Not on file  . Food insecurity:    Worry: Not on file    Inability: Not on file  . Transportation needs:    Medical: Not on file    Non-medical: Not on file  Tobacco Use  . Smoking status: Never Smoker  . Smokeless tobacco: Never  Used  Substance and Sexual Activity  . Alcohol use: No    Alcohol/week: 0.0 standard drinks  . Drug use: No  . Sexual activity: Not Currently  Lifestyle  . Physical activity:    Days per week: Not on file    Minutes per session: Not on file  . Stress: Not on file  Relationships  . Social connections:    Talks on phone: Not on file    Gets together: Not on file    Attends religious service: Not on file    Active member of club or organization: Not on file    Attends meetings of clubs or organizations: Not on file    Relationship status: Not on file  Other Topics Concern  .  Not on file  Social History Narrative   Regular exercise- Yes     Family History:  The patient's family history includes Arthritis in an other family member; Cancer in an other family member; Diabetes in an other family member; Heart attack in her father; Heart disease in her father; Hyperlipidemia in an other family member; Hypertension in an other family member.   ROS:   Please see the history of present illness.    Review of Systems  Constitution: Positive for malaise/fatigue.  HENT: Negative.   Eyes: Positive for visual disturbance.  Cardiovascular: Positive for leg swelling.  Respiratory: Negative.   Endocrine: Negative.   Hematologic/Lymphatic: Negative.   Musculoskeletal: Positive for back pain and joint swelling.  Gastrointestinal: Negative.   Genitourinary: Negative.   Neurological: Negative.    All other systems reviewed and are negative.   PHYSICAL EXAM:   VS:  BP 130/70   Pulse (!) 111   Ht 5\' 5"  (1.651 m)   Wt 183 lb (83 kg)   SpO2 98%   BMI 30.45 kg/m   Physical Exam  GEN: Well nourished, well developed, in no acute distress  Neck: no JVD, carotid bruits, or masses Cardiac:RRR; no murmurs, rubs, or gallops  Respiratory:  clear to auscultation bilaterally, normal work of breathing GI: soft, nontender, nondistended, + BS Ext: without cyanosis, clubbing, or edema, Good distal  pulses bilaterally Neuro:  Alert and Oriented x 3 Psych: euthymic mood, full affect  Wt Readings from Last 3 Encounters:  03/27/18 183 lb (83 kg)  03/08/18 190 lb (86.2 kg)  03/07/18 186 lb (84.4 kg)      Studies/Labs Reviewed:   EKG:  EKG is  ordered today.  The ekg ordered today demonstrates sinus tachycardia 103 bpm  Recent Labs: 11/15/2017: TSH 0.68 03/08/2018: ALT 11; BUN 15; Creatinine, Ser 0.96; Hemoglobin 13.8; Platelets 319.0; Potassium 3.8; Sodium 139   Lipid Panel    Component Value Date/Time   CHOL 203 (H) 02/02/2017 1709   TRIG 147.0 02/02/2017 1709   HDL 61.10 02/02/2017 1709   CHOLHDL 3 02/02/2017 1709   VLDL 29.4 02/02/2017 1709   LDLCALC 113 (H) 02/02/2017 1709   LDLDIRECT 74.0 06/14/2016 1356    Additional studies/ records that were reviewed today include:    Holter monitor 07/20/2017 Study Highlights     NSR with occasional PACs and PVCs  No significant bradycardia.  Average HR 96 bpm.  No pathologic arrhtyhmias.        cardiac catheterization 2/1/2019Conclusion       Prox RCA lesion is 25% stenosed.  Prox Cx lesion is 30% stenosed.  Mid Cx lesion is 25% stenosed.  Ost 3rd Mrg lesion is 30% stenosed.  Prox LAD lesion is 25% stenosed.  Mid LAD lesion is 25% stenosed.  The left ventricular systolic function is normal.  LV end diastolic pressure is normal.  The left ventricular ejection fraction is 55-65% by visual estimate.  There is no aortic valve stenosis.   Nonobstructive CAD.   Continue aggressive medical therapy.       Nuclear stress test 1/17/2019Study Highlights    Addendum by Sueanne Margarita, MD on Mon Feb 13, 2017  1:18 PM   The left ventricular ejection fraction is hyperdynamic (>65%).  Nuclear stress EF: 66%.  There was no ST segment deviation noted during stress.  The perfusion study is normal.  This is an intermediate study due to transient ischemic dilatation (TID 1.44).      2D echo  9/2018Study  Conclusions   - Left ventricle: Abnormal septal motion. There was mild concentric   hypertrophy. Systolic function was normal. The estimated ejection   fraction was in the range of 55% to 60%. Doppler parameters are   consistent with abnormal left ventricular relaxation (grade 1   diastolic dysfunction). - Aortic valve: Nodular calcification of the non coronary cusp.   There was mild regurgitation. - Mitral valve: Calcified annulus. Mildly thickened leaflets . - Atrial septum: A patent foramen ovale cannot be excluded. - Impressions: Abnormal GLS -12.4   Impressions:   - Abnormal GLS -12.4         ASSESSMENT:    1. Atrial flutter, unspecified type ([place])   2. Coronary artery disease involving native coronary artery of native heart without angina pectoris   3. Essential hypertension   4. Edema, unspecified type   5. Hypertriglyceridemia      PLAN:  In order of problems listed above:  Atrial flutter occurred while on chemotherapy.  CHA2DS2-VASc equals 4.  Patient had bleeding from her hemorrhoids and stopped the Eliquis.  Patient's heart rate was over 100 when she got here today but she had significant back pain right before she came.  She is normal sinus rhythm.  She keeps a close eye on her pulse is usually 70-80.  No changes.   CAD with 50% RCA and circumflex 80% diagonal 1 without angina  Essential hypertension blood pressure controlled.  Chronic lower extremity edema oncology prescribed Lasix as needed by oncologist but patient is not taking in her edema is down since taking an herbal tea.  Hypertriglyceridemia 147 09/2017   Medication Adjustments/Labs and Tests Ordered: Current medicines are reviewed at length with the patient today.  Concerns regarding medicines are outlined above.  Medication changes, Labs and Tests ordered today are listed in the Patient Instructions below. Patient Instructions  Medication Instructions:  Your physician recommends that you  continue on your current medications as directed. Please refer to the Current Medication list given to you today.  If you need a refill on your cardiac medications before your next appointment, please call your pharmacy.   Lab work: None Ordered  If you have labs (blood work) drawn today and your tests are completely normal, you will receive your results only by: [name] MyChart Message (if you have MyChart) OR . A paper copy in the mail If you have any lab test that is abnormal or we need to change your treatment, we will call you to review the results.  Testing/Procedures: None ordered  Follow-Up: At [facility], you and your health needs are our priority.  As part of our continuing mission to provide you with exceptional heart care, we have created designated Provider Care Teams.  These Care Teams include your primary Cardiologist (physician) and Advanced Practice Providers (APPs -  Physician Assistants and Nurse Practitioners) who all work together to provide you with the care you need, when you need it. . You will need a follow up appointment in June 2020.  Please call our office 2 months in advance to schedule this appointment.  You may see Casandra Doffing, MD or one of the following Advanced Practice Providers on your designated Care Team:   . Lyda Jester, PA-C . Dayna Dunn, PA-C . Ermalinda Barrios, PA-C  Any Other Special Instructions Will Be Listed Below (If Applicable).       Signed, Ermalinda Barrios, PA-C  03/27/2018 2:21 PM    [facility] [phone]  [address] [place], [place]  [address] Phone: [phone]; Fax: [phone]

## 2018-04-02 DIAGNOSIS — H1132 Conjunctival hemorrhage, left eye: Secondary | ICD-10-CM | POA: Diagnosis not present

## 2018-04-03 DIAGNOSIS — N3941 Urge incontinence: Secondary | ICD-10-CM | POA: Diagnosis not present

## 2018-04-03 DIAGNOSIS — Z01419 Encounter for gynecological examination (general) (routine) without abnormal findings: Secondary | ICD-10-CM | POA: Diagnosis not present

## 2018-04-03 DIAGNOSIS — Z1231 Encounter for screening mammogram for malignant neoplasm of breast: Secondary | ICD-10-CM | POA: Diagnosis not present

## 2018-04-03 DIAGNOSIS — R350 Frequency of micturition: Secondary | ICD-10-CM | POA: Diagnosis not present

## 2018-04-03 DIAGNOSIS — Z6830 Body mass index (BMI) 30.0-30.9, adult: Secondary | ICD-10-CM | POA: Diagnosis not present

## 2018-04-03 LAB — HM MAMMOGRAPHY

## 2018-04-04 LAB — HM MAMMOGRAPHY

## 2018-04-09 ENCOUNTER — Encounter: Payer: Self-pay | Admitting: Internal Medicine

## 2018-04-10 ENCOUNTER — Telehealth: Payer: Self-pay | Admitting: Internal Medicine

## 2018-04-10 ENCOUNTER — Other Ambulatory Visit: Payer: Self-pay | Admitting: Internal Medicine

## 2018-04-10 ENCOUNTER — Ambulatory Visit: Payer: Medicare Other | Admitting: Endocrinology

## 2018-04-10 DIAGNOSIS — J189 Pneumonia, unspecified organism: Secondary | ICD-10-CM

## 2018-04-10 DIAGNOSIS — J181 Lobar pneumonia, unspecified organism: Principal | ICD-10-CM

## 2018-04-10 MED ORDER — AMOXICILLIN-POT CLAVULANATE 875-125 MG PO TABS: 1.0000 | ORAL_TABLET | Freq: Two times a day (BID) | ORAL | 0 refills | Status: AC

## 2018-04-10 NOTE — Telephone Encounter (Signed)
Pt informed rx has been sent.  

## 2018-04-10 NOTE — Telephone Encounter (Signed)
Copied from [place] [phone]. Topic: General - Inquiry >> Apr 10, 2018  9:07 AM Nicole [name] wrote: Reason for CRM: Nicole [name] with Palliative care was in the home seeing the husband and the pt asked if she would listen to her lungs, as she having a little issue. Nicole [name] states pt sounds like consolidated right lower lobe bronchi and wheezing.  Pt has a hx of cancer and may benefit with a abx as well.  Please call Nicole [name] and advise if able to provide.  Pt does not want to come into the office at this time.

## 2018-04-10 NOTE — Telephone Encounter (Signed)
An antibiotic prescription has been sent

## 2018-05-07 ENCOUNTER — Other Ambulatory Visit: Payer: Self-pay

## 2018-05-07 ENCOUNTER — Inpatient Hospital Stay: Payer: Medicare Other | Attending: Internal Medicine

## 2018-05-07 ENCOUNTER — Ambulatory Visit (HOSPITAL_COMMUNITY)
Admission: RE | Admit: 2018-05-07 | Discharge: 2018-05-07 | Disposition: A | Discharge status: Home / Self Care | Payer: Medicare Other | Source: Ambulatory Visit | Attending: Oncology | Admitting: Oncology

## 2018-05-07 ENCOUNTER — Encounter (HOSPITAL_COMMUNITY): Payer: Self-pay

## 2018-05-07 DIAGNOSIS — R0602 Shortness of breath: Secondary | ICD-10-CM | POA: Diagnosis not present

## 2018-05-07 DIAGNOSIS — E119 Type 2 diabetes mellitus without complications: Secondary | ICD-10-CM | POA: Diagnosis not present

## 2018-05-07 DIAGNOSIS — I4892 Unspecified atrial flutter: Secondary | ICD-10-CM | POA: Insufficient documentation

## 2018-05-07 DIAGNOSIS — I4891 Unspecified atrial fibrillation: Secondary | ICD-10-CM | POA: Diagnosis not present

## 2018-05-07 DIAGNOSIS — C3431 Malignant neoplasm of lower lobe, right bronchus or lung: Secondary | ICD-10-CM | POA: Diagnosis not present

## 2018-05-07 DIAGNOSIS — C3491 Malignant neoplasm of unspecified part of right bronchus or lung: Secondary | ICD-10-CM | POA: Insufficient documentation

## 2018-05-07 DIAGNOSIS — Z7984 Long term (current) use of oral hypoglycemic drugs: Secondary | ICD-10-CM | POA: Insufficient documentation

## 2018-05-07 DIAGNOSIS — Z9221 Personal history of antineoplastic chemotherapy: Secondary | ICD-10-CM | POA: Insufficient documentation

## 2018-05-07 DIAGNOSIS — I700 Atherosclerosis of aorta: Secondary | ICD-10-CM | POA: Insufficient documentation

## 2018-05-07 DIAGNOSIS — I10 Essential (primary) hypertension: Secondary | ICD-10-CM | POA: Insufficient documentation

## 2018-05-07 DIAGNOSIS — Z8542 Personal history of malignant neoplasm of other parts of uterus: Secondary | ICD-10-CM | POA: Insufficient documentation

## 2018-05-07 DIAGNOSIS — Z9225 Personal history of immunosupression therapy: Secondary | ICD-10-CM | POA: Diagnosis not present

## 2018-05-07 DIAGNOSIS — Z79899 Other long term (current) drug therapy: Secondary | ICD-10-CM | POA: Diagnosis not present

## 2018-05-07 DIAGNOSIS — Z923 Personal history of irradiation: Secondary | ICD-10-CM | POA: Insufficient documentation

## 2018-05-07 DIAGNOSIS — I2510 Atherosclerotic heart disease of native coronary artery without angina pectoris: Secondary | ICD-10-CM | POA: Diagnosis not present

## 2018-05-07 LAB — CBC WITH DIFFERENTIAL (CANCER CENTER ONLY)
Abs Immature Granulocytes: 0.02 10*3/uL (ref 0.00–0.07)
Basophils Absolute: 0 10*3/uL (ref 0.0–0.1)
Basophils Relative: 0 %
Eosinophils Absolute: 0.1 10*3/uL (ref 0.0–0.5)
Eosinophils Relative: 2 %
HCT: 44.7 % (ref 36.0–46.0)
Hemoglobin: 14.4 g/dL (ref 12.0–15.0)
Immature Granulocytes: 0 %
Lymphocytes Relative: 14 %
Lymphs Abs: 0.7 10*3/uL (ref 0.7–4.0)
MCH: 30.8 pg (ref 26.0–34.0)
MCHC: 32.2 g/dL (ref 30.0–36.0)
MCV: 95.5 fL (ref 80.0–100.0)
Monocytes Absolute: 0.5 10*3/uL (ref 0.1–1.0)
Monocytes Relative: 10 %
Neutro Abs: 3.9 10*3/uL (ref 1.7–7.7)
Neutrophils Relative %: 74 %
Platelet Count: 204 10*3/uL (ref 150–400)
RBC: 4.68 MIL/uL (ref 3.87–5.11)
RDW: 13.5 % (ref 11.5–15.5)
WBC Count: 5.3 10*3/uL (ref 4.0–10.5)
nRBC: 0 % (ref 0.0–0.2)

## 2018-05-07 LAB — CMP (CANCER CENTER ONLY)
ALT: 15 U/L (ref 0–44)
AST: 14 U/L — ABNORMAL LOW (ref 15–41)
Albumin: 3.4 g/dL — ABNORMAL LOW (ref 3.5–5.0)
Alkaline Phosphatase: 69 U/L (ref 38–126)
Anion gap: 9 (ref 5–15)
BUN: 20 mg/dL (ref 8–23)
CO2: 23 mmol/L (ref 22–32)
Calcium: 9.3 mg/dL (ref 8.9–10.3)
Chloride: 106 mmol/L (ref 98–111)
Creatinine: 0.95 mg/dL (ref 0.44–1.00)
GFR, Est AFR Am: >60 mL/min (ref >60)
GFR, Estimated: 59 mL/min — ABNORMAL LOW (ref >60)
Glucose, Bld: 116 mg/dL — ABNORMAL HIGH (ref 70–99)
Potassium: 4.2 mmol/L (ref 3.5–5.1)
Sodium: 138 mmol/L (ref 135–145)
Total Bilirubin: 0.5 mg/dL (ref 0.3–1.2)
Total Protein: 7.4 g/dL (ref 6.5–8.1)

## 2018-05-07 MED ORDER — IOHEXOL 300 MG/ML  SOLN
75.0000 mL | Freq: Once | INTRAMUSCULAR | Status: AC | PRN
  Administered 2018-05-07: 75 mL via INTRAVENOUS

## 2018-05-07 MED ORDER — SODIUM CHLORIDE (PF) 0.9 % IJ SOLN
INTRAMUSCULAR | Status: AC
  Filled 2018-05-07: qty 50

## 2018-05-08 ENCOUNTER — Telehealth: Payer: Self-pay | Admitting: Internal Medicine

## 2018-05-08 NOTE — Telephone Encounter (Signed)
Called patient regarding upcoming webex meeting, patient does not have capability and would prefer it to be telephone visit.

## 2018-05-09 ENCOUNTER — Other Ambulatory Visit: Payer: Self-pay | Admitting: Endocrinology

## 2018-05-10 ENCOUNTER — Encounter: Payer: Self-pay | Admitting: Internal Medicine

## 2018-05-10 ENCOUNTER — Inpatient Hospital Stay (HOSPITAL_BASED_OUTPATIENT_CLINIC_OR_DEPARTMENT_OTHER): Payer: Medicare Other | Admitting: Internal Medicine

## 2018-05-10 DIAGNOSIS — C3491 Malignant neoplasm of unspecified part of right bronchus or lung: Secondary | ICD-10-CM | POA: Diagnosis not present

## 2018-05-10 DIAGNOSIS — C3490 Malignant neoplasm of unspecified part of unspecified bronchus or lung: Secondary | ICD-10-CM

## 2018-05-10 NOTE — Progress Notes (Signed)
[facility] Telephone:([phone]   Fax:([phone]) [phone]  PROGRESS NOTE FOR TELEMEDICINE VISITS  [name], MD [address] [place] [address]  I connected with@ on 05/10/18 at 10:45 AM EDT by telephone visit and verified that I am speaking with the correct person using two identifiers.   I discussed the limitations, risks, security and privacy concerns of performing an evaluation and management service by telemedicine and the availability of in-person appointments. I also discussed with the patient that there may be a patient responsible charge related to this service. The patient expressed understanding and agreed to proceed.  Other persons participating in the visit and their role in the encounter: None  Patient's location: Home Provider's location: [place]  DIAGNOSIS:Stage IIIA (T1b, N2, M0) non-small cell lung cancer, adenocarcinoma presented with right lower lobe lung mass in addition to right hilar and subcarinal lymphadenopathy diagnosed in June 2018.  Biomarker Findings Tumor Mutational Burden - TMB-Intermediate (8 Muts/Mb) Microsatellite Status - Cannot Be Determined Genomic Findings For a complete list of the genes assayed, please refer to the Appendix. EGFR E709K, G719S UXN2TF T732K GUR4Y7 splice site [id number]>S 7 Disease relevant genes with no reportable alterations: KRAS, ALK, BRAF, MET, ERBB2, RET, ROS1  PDL1 Expression70%.  PRIOR THERAPY: 1) Concurrent chemoradiation with weekly carboplatin for AUC of 2 and paclitaxel 45 MG/M2. Status post 7 cycles with partial response. 2) Consolidation treatment with immunotherapy with Imfinzi (Durvalumab) 10 MG/KG every 2 weeks, first dose 10/19/2016. Status post 5 cycles. Discontinued secondary to intolerance and persistent pneumonitis.  CURRENT THERAPY: Observation.   INTERVAL HISTORY: Nicole [name] 78 y.o. female has a telephone virtual visit today for  evaluation and discussion of her scan results.  The patient is feeling fine today with no concerning complaints.  She denied having any chest pain, shortness of breath, cough or hemoptysis.  She has no current nausea, vomiting, diarrhea or constipation.  The patient denied having any recent weight loss or night sweats.  She has no fever or chills.  She has no headache or visual changes.  She had repeat CT scan of the chest performed recently and she is here for evaluation and discussion of her scan results.  MEDICAL HISTORY: Past Medical History:  Diagnosis Date   A-fib [facility])    Adenocarcinoma of right lung, stage 3 ([place]) 07/28/2016   Atrial flutter ([name]) 10/05/2016   Bronchitis    hx of   Cancer ([place]) 2004   uterine/cervical   Cancer-related pain 10/06/2016   Coronary artery disease 02/21/2017   JAN 2019 Prox RCA lesion is 25% stenosed. Prox Cx lesion is 30% stenosed. Mid Cx lesion is 25% stenosed. Ost 3rd Mrg lesion is 30% stenosed. Prox LAD lesion is 25% stenosed. Mid LAD lesion is 25% stenosed. The left ventricular systolic function is normal. LV end diastolic pressure is normal. The left ventricular ejection fraction is 55-65% by visual estimate. There is no aortic valve stenosis.   N   Diabetes mellitus    type 2   Edema 07/12/2017   Essential hypertension 01/20/2009   Gallstones    Headache    History of radiation therapy 08/09/2016 to 09/19/2016   Right lung was treated to 60 Gy in 30 fractions at 2 Gy per fraction   Hypertension    Hypertriglyceridemia 08/22/2012   Low back pain    Lung mass    with lymphadenopathy   Osteoarthritis    Type II diabetes mellitus with  manifestations ([place]) 01/20/2009   Estimated Creatinine Clearance: 49.8 mL/min (by C-G formula based on SCr of 1 mg/dL).    ALLERGIES:  is allergic to no known allergies.  MEDICATIONS:  Current Outpatient Medications  Medication Sig Dispense Refill   benzonatate (TESSALON) 100 MG capsule TAKE 2  CAPSULES BY MOUTH THREE TIMES DAILY AS NEEDED FOR COUGH 180 capsule 0   COLLAGEN PO Take 1 tablet by mouth daily.      diltiazem (CARDIZEM CD) 360 MG 24 hr capsule Take 1 capsule (360 mg total) by mouth daily. 90 capsule 3   glucose blood (ACCU-CHEK GUIDE) test strip 1 each by Other route daily. Use to check blood sugars daily DX code: E11.8 100 each 3   irbesartan (AVAPRO) 300 MG tablet TAKE 1 TABLET BY MOUTH ONCE DAILY 90 tablet 1   Lancets (ACCU-CHEK SOFT TOUCH) lancets Use to check blood sugars daily Dx E11.9 100 each 3   metFORMIN (GLUCOPHAGE-XR) 500 MG 24 hr tablet TAKE 2 TABLETS(1000 MG) BY MOUTH DAILY WITH BREAKFAST 180 tablet 3   silver sulfADIAZINE (SILVADENE) 1 % cream Apply 1 application topically daily.     sitaGLIPtin (JANUVIA) 100 MG tablet Take 1 tablet (100 mg total) by mouth daily. 30 tablet 11   No current facility-administered medications for this visit.     SURGICAL HISTORY:  Past Surgical History:  Procedure Laterality Date   ABDOMINAL HYSTERECTOMY  2004   APPENDECTOMY  when 78 years old   COLONOSCOPY  08-05-09   [facility]   INCONTINENCE SURGERY     KNEE ARTHROSCOPY Left    LEFT HEART CATH AND CORONARY ANGIOGRAPHY N/A 02/24/2017   Procedure: LEFT HEART CATH AND CORONARY ANGIOGRAPHY;  Surgeon: Jettie Booze, MD;  Location: [facility];  Service: Cardiovascular;  Laterality: N/A;   LEFT HEART CATHETERIZATION WITH CORONARY ANGIOGRAM N/A 07/19/2013   Procedure: LEFT HEART CATHETERIZATION WITH CORONARY ANGIOGRAM;  Surgeon: Sinclair Grooms, MD;  Location: [facility] CATH LAB;  Service: Cardiovascular;  Laterality: N/A;   POLYPECTOMY  08-05-09   2 polyps   TOTAL KNEE ARTHROPLASTY Left 03/28/2012   Procedure: LEFT TOTAL KNEE ARTHROPLASTY;  Surgeon: Tobi Bastos, MD;  Location: [facility];  Service: Orthopedics;  Laterality: Left;   TUBAL LIGATION     VIDEO BRONCHOSCOPY WITH ENDOBRONCHIAL ULTRASOUND N/A 07/11/2016   Procedure: VIDEO BRONCHOSCOPY WITH  ENDOBRONCHIAL ULTRASOUND;  Surgeon: Marshell Garfinkel, MD;  Location: [facility];  Service: Pulmonary;  Laterality: N/A;    REVIEW OF SYSTEMS:  A comprehensive review of systems was negative.   LABORATORY DATA: Lab Results  Component Value Date   WBC 5.3 05/07/2018   HGB 14.4 05/07/2018   HCT 44.7 05/07/2018   MCV 95.5 05/07/2018   PLT 204 05/07/2018      Chemistry      Component Value Date/Time   NA 138 05/07/2018 1116   NA 140 02/21/2017 1238   NA 139 01/26/2017 0915   K 4.2 05/07/2018 1116   K 4.1 01/26/2017 0915   CL 106 05/07/2018 1116   CO2 23 05/07/2018 1116   CO2 24 01/26/2017 0915   BUN 20 05/07/2018 1116   BUN 31 (H) 02/21/2017 1238   BUN 22.0 01/26/2017 0915   CREATININE 0.95 05/07/2018 1116   CREATININE 0.9 01/26/2017 0915      Component Value Date/Time   CALCIUM 9.3 05/07/2018 1116   CALCIUM 9.2 01/26/2017 0915   ALKPHOS 69 05/07/2018 1116   ALKPHOS 72 01/26/2017 0915  AST 14 (L) 05/07/2018 1116   AST 13 01/26/2017 0915   ALT 15 05/07/2018 1116   ALT 12 01/26/2017 0915   BILITOT 0.5 05/07/2018 1116   BILITOT 0.37 01/26/2017 0915       RADIOGRAPHIC STUDIES: Ct Chest W Contrast  Result Date: 05/08/2018 CLINICAL DATA:  78 year old female with history of lung cancer. Chemotherapy completed 10/19/2016. Immunotherapy complete December 2019. Shortness of breath on exertion. EXAM: CT CHEST WITH CONTRAST TECHNIQUE: Multidetector CT imaging of the chest was performed during intravenous contrast administration. CONTRAST:  38m OMNIPAQUE IOHEXOL 300 MG/ML  SOLN COMPARISON:  Chest CT 12/29/2017. FINDINGS: Cardiovascular: Heart size is mildly enlarged. There is no significant pericardial fluid, thickening or pericardial calcification. There is aortic atherosclerosis, as well as atherosclerosis of the great vessels of the mediastinum and the coronary arteries, including calcified atherosclerotic plaque in the left main, left anterior descending, left circumflex and right  coronary arteries. Mild thickening calcification of the aortic valve. Severe calcifications of the mitral annulus. Mediastinum/Nodes: No pathologically enlarged mediastinal or hilar lymph nodes. Esophagus is unremarkable in appearance. No axillary lymphadenopathy. Lungs/Pleura: There continues to be a mass-like area of architectural distortion in the medial aspect of the right lower lobe (axial image 95 of series 10) which currently measures approximately 2.3 x 5.1 cm, stable compared to prior examination, most compatible with an area of chronic postradiation masslike fibrosis. A few scattered tiny pulmonary nodules are again noted, including a 3 mm left upper lobe nodule (axial image 41 of series 10), and a 4 mm right upper lobe nodule (axial image 42 of series 10), stable compared to prior examinations, favored to be benign. No other larger more suspicious appearing pulmonary nodules or masses are noted. No acute consolidative airspace disease. No pleural effusions. Upper Abdomen: Aortic atherosclerosis.  Status post cholecystectomy. Musculoskeletal: There are no aggressive appearing lytic or blastic lesions noted in the visualized portions of the skeleton. IMPRESSION: 1. Stable post treatment related changes of mass-like fibrosis in the medial aspect of the right lower lobe. No definite findings to suggest local recurrence of disease or definite metastatic disease in the thorax. 2. Mild cardiomegaly. 3. Aortic atherosclerosis, in addition to left main and 3 vessel coronary artery disease. Assessment for potential risk factor modification, dietary therapy or pharmacologic therapy may be warranted, if clinically indicated. 4. There are calcifications of the aortic valve and mitral annulus. Echocardiographic correlation for evaluation of potential valvular dysfunction may be warranted if clinically indicated. Aortic Atherosclerosis (ICD10-I70.0). Electronically Signed   By: DVinnie LangtonM.D.   On: 05/08/2018  09:46    ASSESSMENT AND PLAN:  This is a very pleasant 78years old Asian female recently diagnosed with a stage IIIa non-small cell4 lung cancer, adenocarcinoma. She underwent a course of concurrent chemoradiation with weekly carboplatin and paclitaxel status post 7 cycles. The patient tolerated the previous course fairly well except for mild odynophagia and dry cough. The patient was a started on consolidation treatment with immunotherapy with Imfinzi (Durvalumab) status post 5 cycles.  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 The patient has been on observation since that time and she is doing fine. She had repeat CT scan of the chest performed recently.  I personally and independently reviewed the scan and discussed the results with the patient today. Her scan showed no concerning findings for disease progression. I recommended for the patient to continue on observation with repeat CT scan of the chest in 4 months. She was advised to call immediately  if she has any concerning symptoms in the interval. I discussed the assessment and treatment plan with the patient. The patient was provided an opportunity to ask questions and all were answered. The patient agreed with the plan and demonstrated an understanding of the instructions.   The patient was advised to call back or seek an in-person evaluation if the symptoms worsen or if the condition fails to improve as anticipated.  I provided 12 minutes of non face-to-face telephone visit time during this encounter, and > 50% was spent counseling as documented under my assessment & plan.  Eilleen Kempf, MD 05/10/2018 10:56 AM  Disclaimer: This note was dictated with voice recognition software. Similar sounding words can inadvertently be transcribed and may not be corrected upon review.

## 2018-06-01 ENCOUNTER — Telehealth: Payer: Self-pay | Admitting: Interventional Cardiology

## 2018-06-01 MED ORDER — VERAPAMIL HCL ER 180 MG PO TBCR: 180.0000 mg | EXTENDED_RELEASE_TABLET | Freq: Two times a day (BID) | ORAL | 3 refills | Status: DC

## 2018-06-01 MED ORDER — VERAPAMIL HCL ER 180 MG PO TBCR: 360.0000 mg | EXTENDED_RELEASE_TABLET | Freq: Every day | ORAL | 3 refills | Status: DC

## 2018-06-01 NOTE — Telephone Encounter (Signed)
We could have her take Cardizem 240mg  plus a 120 mg daily if that's cheaper or switch her to verapamil 180 mg 2 tablets daily. Please tell her thank you for the face mask!!! I had no idea how much I would need it!!!! I wonder if [facility] or [facility] would save her any money?

## 2018-06-01 NOTE — Telephone Encounter (Addendum)
Called patient and made her aware of the recommendations. Patient states that she will try the verapamil 180 mg 2 tablets QD. Made patient aware that Ermalinda Barrios, PA said thank you for the mask.

## 2018-06-01 NOTE — Telephone Encounter (Signed)
Returned call to patient who states that her diltiazem 360 mg QD is $100 for a 30 day supply. Patient is asking if there is another medicine that she can take instead. She states that her BP has been around 130-140/70s and HR 70-80. Denies palpitations, SOB, or any other Sx.  Called and spoke to [place] at [facility] on [address] She states that with the patient's insurance the 30 day supply of diltiazem 360 mg is $84.47. She states that she had paid $238 for a 90 day supply last time. I asked her to see how much the diltiazem 240 mg QD would be and she said that it would be $21.35 for a 30 day supply which is significantly lower than the $84.47 for the 360 mg. Will forward to Ermalinda Barrios, PA to see if she would be willing to try lower dose.

## 2018-06-01 NOTE — Telephone Encounter (Signed)
NewMessage    Pt c/o medication issue:  1. Name of Medication: Diltiazem 360mg   2. How are you currently taking this medication (dosage and times per day)? 1 dailey  3. Are you having a reaction (difficulty breathing--STAT)? No  4. What is your medication issue? Meication too expensive need a cheaper version.

## 2018-06-13 ENCOUNTER — Telehealth: Payer: Self-pay | Admitting: Interventional Cardiology

## 2018-06-13 NOTE — Telephone Encounter (Signed)
°  Patient states she received a call, unsure who the caller was. Patient declined virtual visit Patient reporting a rash that she thinks is from Verapamil   Pt c/o medication issue:  1. Name of Medication: verapamil (CALAN-SR) 180 MG CR tablet  2. How are you currently taking this medication (dosage and times per day)? As written  3. Are you having a reaction (difficulty breathing--STAT)? NO 4. What is your medication issue? Patient has a rash, red spots on chest area for 1 week

## 2018-06-13 NOTE — Telephone Encounter (Signed)
Attempted to contact patient but there was no answer and VM did not pick up. Will try again at another time.

## 2018-06-20 ENCOUNTER — Other Ambulatory Visit: Payer: Self-pay | Admitting: Internal Medicine

## 2018-06-20 ENCOUNTER — Telehealth: Payer: Self-pay | Admitting: Interventional Cardiology

## 2018-06-20 DIAGNOSIS — K862 Cyst of pancreas: Secondary | ICD-10-CM

## 2018-06-20 NOTE — Telephone Encounter (Signed)
New Message            Patient is calling about a rash and her pulse, patient thinks it has something to do with her medication "Verapamill"Pls call to advise

## 2018-06-20 NOTE — Telephone Encounter (Signed)
Patient calling and states that she has a rash on both of her breasts. She states that she has had intermittent episodes of palpitations the past couple of days. Patient's verapamil was recently changed to 180 mg 2 tablets QD. She states that her Sx started after and wonders if it is related. Patient requesting appt with JV to discuss. Video Visit arranged for tomorrow.     Virtual Visit Pre-Appointment Phone Call TELEPHONE CALL NOTE  BRION HEDGES has been deemed a candidate for a follow-up tele-health visit to limit community exposure during the Covid-19 pandemic. I spoke with the patient via phone to ensure availability of phone/video source, confirm preferred email & phone number, and discuss instructions and expectations.  I reminded LOURINE ALBERICO to be prepared with any vital sign and/or heart rhythm information that could potentially be obtained via home monitoring, at the time of her visit. I reminded LISAANN ATHA to expect a phone call prior to her visit.  Patient agrees to consent below.   Cleon Gustin, RN 06/20/2018 4:22 PM    FULL LENGTH CONSENT FOR TELE-HEALTH VISIT   I hereby voluntarily request, consent and authorize [facility] and its employed or contracted physicians, physician assistants, nurse practitioners or other licensed health care professionals (the Practitioner), to provide me with telemedicine health care services (the "Services") as deemed necessary by the treating Practitioner. I acknowledge and consent to receive the Services by the Practitioner via telemedicine. I understand that the telemedicine visit will involve communicating with the Practitioner through live audiovisual communication technology and the disclosure of certain medical information by electronic transmission. I acknowledge that I have been given the opportunity to request an in-person assessment or other available alternative prior to the telemedicine visit and am voluntarily  participating in the telemedicine visit.  I understand that I have the right to withhold or withdraw my consent to the use of telemedicine in the course of my care at any time, without affecting my right to future care or treatment, and that the Practitioner or I may terminate the telemedicine visit at any time. I understand that I have the right to inspect all information obtained and/or recorded in the course of the telemedicine visit and may receive copies of available information for a reasonable fee.  I understand that some of the potential risks of receiving the Services via telemedicine include:  [name] Delay or interruption in medical evaluation due to technological equipment failure or disruption; . Information transmitted may not be sufficient (e.g. poor resolution of images) to allow for appropriate medical decision making by the Practitioner; and/or  . In rare instances, security protocols could fail, causing a breach of personal health information.  Furthermore, I acknowledge that it is my responsibility to provide information about my medical history, conditions and care that is complete and accurate to the best of my ability. I acknowledge that Practitioner's advice, recommendations, and/or decision may be based on factors not within their control, such as incomplete or inaccurate data provided by me or distortions of diagnostic images or specimens that may result from electronic transmissions. I understand that the practice of medicine is not an exact science and that Practitioner makes no warranties or guarantees regarding treatment outcomes. I acknowledge that I will receive a copy of this consent concurrently upon execution via email to the email address I last provided but may also request a printed copy by calling the office of [facility].    I understand that my insurance  will be billed for this visit.   I have read or had this consent read to me. . I understand the contents of this  consent, which adequately explains the benefits and risks of the Services being provided via telemedicine.  . I have been provided ample opportunity to ask questions regarding this consent and the Services and have had my questions answered to my satisfaction. . I give my informed consent for the services to be provided through the use of telemedicine in my medical care  By participating in this telemedicine visit I agree to the above.

## 2018-06-21 ENCOUNTER — Encounter: Payer: Self-pay | Admitting: Interventional Cardiology

## 2018-06-21 ENCOUNTER — Telehealth (INDEPENDENT_AMBULATORY_CARE_PROVIDER_SITE_OTHER): Payer: Medicare Other | Admitting: Interventional Cardiology

## 2018-06-21 ENCOUNTER — Other Ambulatory Visit: Payer: Self-pay

## 2018-06-21 DIAGNOSIS — E118 Type 2 diabetes mellitus with unspecified complications: Secondary | ICD-10-CM | POA: Diagnosis not present

## 2018-06-21 DIAGNOSIS — I2510 Atherosclerotic heart disease of native coronary artery without angina pectoris: Secondary | ICD-10-CM | POA: Diagnosis not present

## 2018-06-21 DIAGNOSIS — R600 Localized edema: Secondary | ICD-10-CM | POA: Diagnosis not present

## 2018-06-21 DIAGNOSIS — I4892 Unspecified atrial flutter: Secondary | ICD-10-CM

## 2018-06-21 NOTE — Progress Notes (Signed)
Virtual Visit via Video Note   This visit type was conducted due to national recommendations for restrictions regarding the COVID-19 Pandemic (e.g. social distancing) in an effort to limit this patient's exposure and mitigate transmission in our community.  Due to her co-morbid illnesses, this patient is at least at moderate risk for complications without adequate follow up.  This format is felt to be most appropriate for this patient at this time.  All issues noted in this document were discussed and addressed.  A limited physical exam was performed with this format.  Please refer to the patient's chart for her consent to telehealth for [facility].   Date:  06/21/2018   ID:  Nicole [name], DOB 1940-04-12, MRN [id number]  Patient Location: Home Provider Location: Home  PCP:  [name], MD  Cardiologist:  Larae Grooms, MD  Electrophysiologist:  None   Evaluation Performed:  Follow-Up Visit  Chief Complaint:  Atrial flutter  History of Present Illness:    Nicole [name] is a 78 y.o. female with has history of lung cancer status post radiation and was felt she might have radiation pneumonitis and was treated with steroids which seemed to help her cough. Cardiac catheterization 02/24/2017 showed nonobstructive CAD with 25% proximal RCA, 30% proximal circumflex, 25% mid circumflex, 30% ostial third marginal, 25% proximal and mid LAD, normal LVEF 55 to 65%, no aortic valve stenosis. Aggressive medical therapy recommended   I saw the patient 06/06/2017 at which time she was having rectal bleeding on Eliquis ( for atrial flutter) felt secondary to hemorrhoids. She had stopped her Eliquis andthe rectal bleeding resolved. Stool cards were negative and hemoglobin was stable. Colonoscopy found diverticulosis as well as hemorrhoids which seem to be the cause of the rectal bleeding. If recurrent rectal bleeding he recommended surgical removal of the hemorrhoids  In July 2019, she  was complaining of increased stress taking care of her husband. She was tachycardic in the office just despite taking her diltiazem. 48-hour monitor showed normal sinus rhythm with occasional PACs and PVCs, no significant bradycardia average heart rate 96 bpm. On follow-up in July she was still under a lot of stress taking care of her husband as well over 2 grandchildren one who is autistic. Heart rate was still 98 blood pressure was up. We increased her diltiazem to 360 mg daily. Had some lower extremity edema and we discussed possible diuretic pressure remained elevated.  At the last visit, diltiazem was changed to verapamil.  She reports that she has a rash on her breasts, but this has improved.   HR controlled on the verapamil.  No bleeding issues since stopping the Eliquis.    The patient does not have symptoms concerning for COVID-19 infection (fever, chills, cough, or new shortness of breath).    Past Medical History:  Diagnosis Date  . A-fib ([place])   . Adenocarcinoma of right lung, stage 3 ([place]) 07/28/2016  . Atrial flutter ([place]) 10/05/2016  . Bronchitis    hx of  . Cancer [facility]) 2004   uterine/cervical  . Cancer-related pain 10/06/2016  . Coronary artery disease 02/21/2017   JAN 2019 Prox RCA lesion is 25% stenosed. Prox Cx lesion is 30% stenosed. Mid Cx lesion is 25% stenosed. Ost 3rd Mrg lesion is 30% stenosed. Prox LAD lesion is 25% stenosed. Mid LAD lesion is 25% stenosed. The left ventricular systolic function is normal. LV end diastolic pressure is normal. The left ventricular ejection fraction is 55-65% by visual estimate.  There is no aortic valve stenosis.   N  . Diabetes mellitus    type 2  . Edema 07/12/2017  . Essential hypertension 01/20/2009  . Gallstones   . Headache   . History of radiation therapy 08/09/2016 to 09/19/2016   Right lung was treated to 60 Gy in 30 fractions at 2 Gy per fraction  . Hypertension   . Hypertriglyceridemia 08/22/2012  . Low back pain    . Lung mass    with lymphadenopathy  . Osteoarthritis   . Type II diabetes mellitus with manifestations ([place]) 01/20/2009   Estimated Creatinine Clearance: 49.8 mL/min (by C-G formula based on SCr of 1 mg/dL).   Past Surgical History:  Procedure Laterality Date  . ABDOMINAL HYSTERECTOMY  2004  . APPENDECTOMY  when 78 years old  . COLONOSCOPY  08-05-09   [name]  . INCONTINENCE SURGERY    . KNEE ARTHROSCOPY Left   . LEFT HEART CATH AND CORONARY ANGIOGRAPHY N/A 02/24/2017   Procedure: LEFT HEART CATH AND CORONARY ANGIOGRAPHY;  Surgeon: Jettie Booze, MD;  Location: [facility];  Service: Cardiovascular;  Laterality: N/A;  . LEFT HEART CATHETERIZATION WITH CORONARY ANGIOGRAM N/A 07/19/2013   Procedure: LEFT HEART CATHETERIZATION WITH CORONARY ANGIOGRAM;  Surgeon: Sinclair Grooms, MD;  Location: [facility] CATH LAB;  Service: Cardiovascular;  Laterality: N/A;  . POLYPECTOMY  08-05-09   2 polyps  . TOTAL KNEE ARTHROPLASTY Left 03/28/2012   Procedure: LEFT TOTAL KNEE ARTHROPLASTY;  Surgeon: Tobi Bastos, MD;  Location: [facility];  Service: Orthopedics;  Laterality: Left;  . TUBAL LIGATION    . VIDEO BRONCHOSCOPY WITH ENDOBRONCHIAL ULTRASOUND N/A 07/11/2016   Procedure: VIDEO BRONCHOSCOPY WITH ENDOBRONCHIAL ULTRASOUND;  Surgeon: Marshell Garfinkel, MD;  Location: [facility];  Service: Pulmonary;  Laterality: N/A;     Current Meds  Medication Sig  . benzonatate (TESSALON) 100 MG capsule TAKE 2 CAPSULES BY MOUTH THREE TIMES DAILY AS NEEDED FOR COUGH  . COLLAGEN PO Take 1 tablet by mouth daily.   . furosemide (LASIX) 20 MG tablet Take 20 mg by mouth as needed.  [name] glucose blood (ACCU-CHEK GUIDE) test strip 1 each by Other route daily. Use to check blood sugars daily DX code: E11.8  . irbesartan (AVAPRO) 300 MG tablet TAKE 1 TABLET BY MOUTH ONCE DAILY  . Lancets (ACCU-CHEK SOFT TOUCH) lancets Use to check blood sugars daily Dx E11.9  . metFORMIN (GLUCOPHAGE-XR) 500 MG 24 hr tablet TAKE 2  TABLETS(1000 MG) BY MOUTH DAILY WITH BREAKFAST  . oxybutynin (DITROPAN-XL) 10 MG 24 hr tablet Take 10 mg by mouth at bedtime.  . silver sulfADIAZINE (SILVADENE) 1 % cream Apply 1 application topically daily.  . sitaGLIPtin (JANUVIA) 100 MG tablet Take 1 tablet (100 mg total) by mouth daily.  . verapamil (CALAN-SR) 180 MG CR tablet Take 2 tablets (360 mg total) by mouth daily.     Allergies:   No known allergies   Social History   Tobacco Use  . Smoking status: Never Smoker  . Smokeless tobacco: Never Used  Substance Use Topics  . Alcohol use: No    Alcohol/week: 0.0 standard drinks  . Drug use: No     Family Hx: The patient's family history includes Arthritis in an other family member; Cancer in an other family member; Diabetes in an other family member; Heart attack in her father; Heart disease in her father; Hyperlipidemia in an other family member; Hypertension in an other family member. There is no  history of Colon cancer.  ROS:   Please see the history of present illness.    Intermittent fatigue All other systems reviewed and are negative.   Prior CV studies:   The following studies were reviewed today:  LVEF 55-60%  Labs/Other Tests and Data Reviewed:    EKG:  An ECG dated 03/2018 was personally reviewed today and demonstrated:  NSR  Recent Labs: 11/15/2017: TSH 0.68 05/07/2018: ALT 15; BUN 20; Creatinine 0.95; Hemoglobin 14.4; Platelet Count 204; Potassium 4.2; Sodium 138   Recent Lipid Panel Lab Results  Component Value Date/Time   CHOL 203 (H) 02/02/2017 05:09 PM   TRIG 147.0 02/02/2017 05:09 PM   HDL 61.10 02/02/2017 05:09 PM   CHOLHDL 3 02/02/2017 05:09 PM   LDLCALC 113 (H) 02/02/2017 05:09 PM   LDLDIRECT 74.0 06/14/2016 01:56 PM    Wt Readings from Last 3 Encounters:  06/21/18 184 lb (83.5 kg)  03/27/18 183 lb (83 kg)  03/08/18 190 lb (86.2 kg)     Objective:    Vital Signs:  BP 122/65   Pulse 87   Ht 5\' 5"  (1.651 m)   Wt 184 lb (83.5 kg)    BMI 30.62 kg/m    VITAL SIGNS:  reviewed GEN:  no acute distress RESPIRATORY:  normal respiratory effort, symmetric expansion PSYCH:  normal affect exam limited by video format  ASSESSMENT & PLAN:    1. Atrial flutter:  No palpitations.  Does not want to take anticoagulation due to prior bleeding problems.  Continue verapamil for rate control and BP controlled.  2. CAD: No angina.  Mild by prior cath. 3. DM2: 8.6 in Jan 2020. Needs a recheck.  Healthy eating and regular exercise. 4. LE edema: Elevate legs.  Compression stockings.     COVID-19 Education: The signs and symptoms of COVID-19 were discussed with the patient and how to seek care for testing (follow up with PCP or arrange E-visit).  The importance of social distancing was discussed today.  Time:   Today, I have spent 15 minutes with the patient with telehealth technology discussing the above problems.     Medication Adjustments/Labs and Tests Ordered: Current medicines are reviewed at length with the patient today.  Concerns regarding medicines are outlined above.   Tests Ordered: No orders of the defined types were placed in this encounter.   Medication Changes: No orders of the defined types were placed in this encounter.   Disposition:  Follow up in 6 month(s)  Signed, Larae Grooms, MD  06/21/2018 2:33 PM    [place]

## 2018-06-21 NOTE — Patient Instructions (Signed)

## 2018-07-04 ENCOUNTER — Telehealth: Payer: Self-pay | Admitting: Emergency Medicine

## 2018-07-04 NOTE — Telephone Encounter (Signed)
Copied from [place] [id number]. Topic: General - Inquiry >> Jul 04, 2018  2:24 PM Percell Belt A wrote: Reason for CRM:  Pt called in and wanted to see if someone could call as to why Dr Ronnald Ramp order a CT of her stomach.  Pt is aware that Dr Ronnald Ramp will not be in the office this week.  [facility] imaging called her to sch and she is not sure why.

## 2018-07-04 NOTE — Telephone Encounter (Signed)
Spoke with pt states she would like to know if is okay with Dr Ronnald Ramp to hold off on the MRI due to the [place] and exposing her husband that is high risk.

## 2018-07-05 ENCOUNTER — Telehealth: Payer: Self-pay | Admitting: Interventional Cardiology

## 2018-07-05 MED ORDER — DILTIAZEM HCL ER 120 MG PO CP12: 360.0000 mg | ORAL_CAPSULE | Freq: Every day | ORAL | 3 refills | Status: DC

## 2018-07-05 MED ORDER — DILTIAZEM HCL ER COATED BEADS 360 MG PO CP24: 360.0000 mg | ORAL_CAPSULE | Freq: Every day | ORAL | 3 refills | Status: DC

## 2018-07-05 NOTE — Telephone Encounter (Signed)
Yes she can switch back to diltiazem 360 mg daily

## 2018-07-05 NOTE — Telephone Encounter (Signed)
New Message          Pt c/o medication issue:  1. Name of Medication: Berapami 180 mg  2. How are you currently taking this medication (dosage and times per day)? 2 x a day  3. Are you having a reaction (difficulty breathing--STAT)? No  4. What is your medication issue? Dizziness  Patient would like a call back concerning this matter.

## 2018-07-05 NOTE — Telephone Encounter (Signed)
I belief correct medication pt is calling about is Verapamil.

## 2018-07-05 NOTE — Telephone Encounter (Signed)
Spoke with pt who states she is having side effects from the verapamil. She recently had a rash and increased fatigue. Yesterday she had a period of dizziness, she laid down and took her vitals. Her SBP was in the 100's with HR around 57. She decided to hold her verapamil last night and this morning. She states her BP this AM was 142/78 HR 76. She states she is feeling better.  She states she does not think the verapamil is working out for her. She would like to know if she can switch back to diltazem. I told her I would forward to Dr Irish Lack and Estella Husk for recommendation and call her back.

## 2018-07-05 NOTE — Telephone Encounter (Signed)
I agree it is ok to go back to Diltiazem.

## 2018-07-05 NOTE — Telephone Encounter (Signed)
Routing to dr Ronnald Ramp, patient states MRI should be scheduled for some time close to end of July or first part of august----are you ok if we hold off on doing this imaging for now---please advise, I will call patient back, thanks

## 2018-07-05 NOTE — Telephone Encounter (Signed)
Pt aware of medication adjustment. She will begin diltiazem, 360mg , 1 capsules, once daily.

## 2018-07-09 NOTE — Telephone Encounter (Signed)
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   Informed pt that it is up to the imaging center can do the lung scan and the abdomen scan at the same time. Pt stated understanding.

## 2018-07-18 ENCOUNTER — Other Ambulatory Visit (INDEPENDENT_AMBULATORY_CARE_PROVIDER_SITE_OTHER): Payer: Medicare Other

## 2018-07-18 ENCOUNTER — Other Ambulatory Visit: Payer: Self-pay

## 2018-07-18 ENCOUNTER — Ambulatory Visit (INDEPENDENT_AMBULATORY_CARE_PROVIDER_SITE_OTHER): Payer: Medicare Other | Admitting: Internal Medicine

## 2018-07-18 ENCOUNTER — Encounter: Payer: Self-pay | Admitting: Internal Medicine

## 2018-07-18 VITALS — BP 140/70 | HR 88 | Temp 98.0°F | Ht 65.0 in | Wt 184.0 lb

## 2018-07-18 DIAGNOSIS — E785 Hyperlipidemia, unspecified: Secondary | ICD-10-CM

## 2018-07-18 DIAGNOSIS — I2510 Atherosclerotic heart disease of native coronary artery without angina pectoris: Secondary | ICD-10-CM

## 2018-07-18 DIAGNOSIS — E118 Type 2 diabetes mellitus with unspecified complications: Secondary | ICD-10-CM | POA: Diagnosis not present

## 2018-07-18 DIAGNOSIS — I10 Essential (primary) hypertension: Secondary | ICD-10-CM | POA: Diagnosis not present

## 2018-07-18 DIAGNOSIS — E781 Pure hyperglyceridemia: Secondary | ICD-10-CM

## 2018-07-18 DIAGNOSIS — J700 Acute pulmonary manifestations due to radiation: Secondary | ICD-10-CM

## 2018-07-18 DIAGNOSIS — I700 Atherosclerosis of aorta: Secondary | ICD-10-CM

## 2018-07-18 LAB — LIPID PANEL
Cholesterol: 190 mg/dL (ref 0–200)
HDL: 50.3 mg/dL (ref >39.00)
NonHDL: 139.98
Total CHOL/HDL Ratio: 4
Triglycerides: 299 mg/dL — ABNORMAL HIGH (ref 0.0–149.0)
VLDL: 59.8 mg/dL — ABNORMAL HIGH (ref 0.0–40.0)

## 2018-07-18 LAB — URINALYSIS, ROUTINE W REFLEX MICROSCOPIC
Bilirubin Urine: NEGATIVE
Hgb urine dipstick: NEGATIVE
Ketones, ur: NEGATIVE
Leukocytes,Ua: NEGATIVE
Nitrite: NEGATIVE
Specific Gravity, Urine: >=1.03 — AB (ref 1.000–1.030)
Total Protein, Urine: NEGATIVE
Urine Glucose: NEGATIVE
Urobilinogen, UA: 0.2 (ref 0.0–1.0)
pH: 6 (ref 5.0–8.0)

## 2018-07-18 LAB — MICROALBUMIN / CREATININE URINE RATIO
Creatinine,U: 163.5 mg/dL
Microalb Creat Ratio: 1 mg/g (ref 0.0–30.0)
Microalb, Ur: 1.6 mg/dL (ref 0.0–1.9)

## 2018-07-18 LAB — LDL CHOLESTEROL, DIRECT: Direct LDL: 108 mg/dL

## 2018-07-18 NOTE — Patient Instructions (Signed)
Type 2 Diabetes Mellitus, Diagnosis, Adult Type 2 diabetes (type 2 diabetes mellitus) is a long-term (chronic) disease. In type 2 diabetes, one or both of these problems may be present:  The pancreas does not make enough of a hormone called insulin.  Cells in the body do not respond properly to insulin that the body makes (insulin resistance). Normally, insulin allows blood sugar (glucose) to enter cells in the body. The cells use glucose for energy. Insulin resistance or lack of insulin causes excess glucose to build up in the blood instead of going into cells. As a result, high blood glucose (hyperglycemia) develops. What increases the risk? The following factors may make you more likely to develop type 2 diabetes:  Having a family member with type 2 diabetes.  Being overweight or obese.  Having an inactive (sedentary) lifestyle.  Having been diagnosed with insulin resistance.  Having a history of prediabetes, gestational diabetes, or polycystic ovary syndrome (PCOS).  Being of American-Indian, African-American, Hispanic/Latino, or Asian/Pacific Islander descent. What are the signs or symptoms? In the early stage of this condition, you may not have symptoms. Symptoms develop slowly and may include:  Increased thirst (polydipsia).  Increased hunger(polyphagia).  Increased urination (polyuria).  Increased urination during the night (nocturia).  Unexplained weight loss.  Frequent infections that keep coming back (recurring).  Fatigue.  Weakness.  Vision changes, such as blurry vision.  Cuts or bruises that are slow to heal.  Tingling or numbness in the hands or feet.  Dark patches on the skin (acanthosis nigricans). How is this diagnosed? This condition is diagnosed based on your symptoms, your medical history, a physical exam, and your blood glucose level. Your blood glucose may be checked with one or more of the following blood tests:  A fasting blood glucose (FBG)  test. You will not be allowed to eat (you will fast) for 8 hours or longer before a blood sample is taken.  A random blood glucose test. This test checks blood glucose at any time of day regardless of when you ate.  An A1c (hemoglobin A1c) blood test. This test provides information about blood glucose control over the previous 2-3 months.  An oral glucose tolerance test (OGTT). This test measures your blood glucose at two times: ? After fasting. This is your baseline blood glucose level. ? Two hours after drinking a beverage that contains glucose. You may be diagnosed with type 2 diabetes if:  Your FBG level is 126 mg/dL (7.0 mmol/L) or higher.  Your random blood glucose level is 200 mg/dL (11.1 mmol/L) or higher.  Your A1c level is 6.5% or higher.  Your OGTT result is higher than 200 mg/dL (11.1 mmol/L). These blood tests may be repeated to confirm your diagnosis. How is this treated? Your treatment may be managed by a specialist called an endocrinologist. Type 2 diabetes may be treated by following instructions from your health care provider about:  Making diet and lifestyle changes. This may include: ? Following an individualized nutrition plan that is developed by a diet and nutrition specialist (registered dietitian). ? Exercising regularly. ? Finding ways to manage stress.  Checking your blood glucose level as often as told.  Taking diabetes medicines or insulin daily. This helps to keep your blood glucose levels in the healthy range. ? If you use insulin, you may need to adjust the dosage depending on how physically active you are and what foods you eat. Your health care provider will tell you how to adjust your dosage.    Taking medicines to help prevent complications from diabetes, such as: ? Aspirin. ? Medicine to lower cholesterol. ? Medicine to control blood pressure. Your health care provider will set individualized treatment goals for you. Your goals will be based on  your age, other medical conditions you have, and how you respond to diabetes treatment. Generally, the goal of treatment is to maintain the following blood glucose levels:  Before meals (preprandial): 80-130 mg/dL (4.4-7.2 mmol/L).  After meals (postprandial): below 180 mg/dL (10 mmol/L).  A1c level: less than 7%. Follow these instructions at home: Questions to ask your health care provider  Consider asking the following questions: ? Do I need to meet with a diabetes educator? ? Where can I find a support group for people with diabetes? ? What equipment will I need to manage my diabetes at home? ? What diabetes medicines do I need, and when should I take them? ? How often do I need to check my blood glucose? ? What number can I call if I have questions? ? When is my next appointment? General instructions  Take over-the-counter and prescription medicines only as told by your health care provider.  Keep all follow-up visits as told by your health care provider. This is important.  For more information about diabetes, visit: ? American Diabetes Association (ADA): www.diabetes.org ? American Association of Diabetes Educators (AADE): www.diabeteseducator.org Contact a health care provider if:  Your blood glucose is at or above 240 mg/dL (13.3 mmol/L) for 2 days in a row.  You have been sick or have had a fever for 2 days or longer, and you are not getting better.  You have any of the following problems for more than 6 hours: ? You cannot eat or drink. ? You have nausea and vomiting. ? You have diarrhea. Get help right away if:  Your blood glucose is lower than 54 mg/dL (3.0 mmol/L).  You become confused or you have trouble thinking clearly.  You have difficulty breathing.  You have moderate or large ketone levels in your urine. Summary  Type 2 diabetes (type 2 diabetes mellitus) is a long-term (chronic) disease. In type 2 diabetes, the pancreas does not make enough of a  hormone called insulin, or cells in the body do not respond properly to insulin that the body makes (insulin resistance).  This condition is treated by making diet and lifestyle changes and taking diabetes medicines or insulin.  Your health care provider will set individualized treatment goals for you. Your goals will be based on your age, other medical conditions you have, and how you respond to diabetes treatment.  Keep all follow-up visits as told by your health care provider. This is important. This information is not intended to replace advice given to you by your health care provider. Make sure you discuss any questions you have with your health care provider. Document Released: 01/10/2005 Document Revised: 08/11/2016 Document Reviewed: 02/13/2015 Elsevier Interactive Patient Education  2019 [employer]  

## 2018-07-18 NOTE — Progress Notes (Signed)
Subjective:  Patient ID: Nicole [name], female    DOB: Oct 03, 1940  Age: 78 y.o. MRN: [id number]  CC: Hyperlipidemia, Diabetes, and Coronary Artery Disease   HPI TIAWANNA LUCHSINGER presents for f/up- She complains of weight gain and painless lower extremity edema.  The edema goes away after she takes a dose of furosemide and elevates her legs.  She tells me her blood sugars have been 130-140 in the morning fasting.  She denies any recent episodes of polys.  She tells me her blood pressure has been well controlled and she has is not recently experienced any chest pain, shortness of breath, diaphoresis, or palpitations.  Outpatient Medications Prior to Visit  Medication Sig Dispense Refill  . diltiazem (CARDIZEM CD) 360 MG 24 hr capsule Take 1 capsule (360 mg total) by mouth daily. 90 capsule 3  . diltiazem (CARDIZEM SR) 120 MG 12 hr capsule Take 3 capsules (360 mg total) by mouth daily. 270 capsule 3  . furosemide (LASIX) 20 MG tablet Take 20 mg by mouth as needed.    [name] glucose blood (ACCU-CHEK GUIDE) test strip 1 each by Other route daily. Use to check blood sugars daily DX code: E11.8 100 each 3  . irbesartan (AVAPRO) 300 MG tablet TAKE 1 TABLET BY MOUTH ONCE DAILY 90 tablet 1  . Lancets (ACCU-CHEK SOFT TOUCH) lancets Use to check blood sugars daily Dx E11.9 100 each 3  . metFORMIN (GLUCOPHAGE-XR) 500 MG 24 hr tablet TAKE 2 TABLETS(1000 MG) BY MOUTH DAILY WITH BREAKFAST 180 tablet 3  . oxybutynin (DITROPAN-XL) 10 MG 24 hr tablet Take 10 mg by mouth at bedtime.    . silver sulfADIAZINE (SILVADENE) 1 % cream Apply 1 application topically daily.    . benzonatate (TESSALON) 100 MG capsule TAKE 2 CAPSULES BY MOUTH THREE TIMES DAILY AS NEEDED FOR COUGH 180 capsule 0  . COLLAGEN PO Take 1 tablet by mouth daily.     . sitaGLIPtin (JANUVIA) 100 MG tablet Take 1 tablet (100 mg total) by mouth daily. 30 tablet 11   No facility-administered medications prior to visit.     ROS Review of Systems   Objective:  BP 140/70 (BP Location: Left Arm, Patient Position: Sitting, Cuff Size: Normal)   Pulse 88   Temp 98 F (36.7 C) (Oral)   Ht 5\' 5"  (1.651 m)   Wt 184 lb (83.5 kg)   SpO2 97%   BMI 30.62 kg/m   BP Readings from Last 3 Encounters:  07/18/18 140/70  06/21/18 122/65  03/27/18 130/70    Wt Readings from Last 3 Encounters:  07/18/18 184 lb (83.5 kg)  06/21/18 184 lb (83.5 kg)  03/27/18 183 lb (83 kg)    Physical Exam Vitals signs reviewed.  Constitutional:      Appearance: She is obese. She is not ill-appearing or diaphoretic.  HENT:     Nose: Nose normal.     Mouth/Throat:     Mouth: Mucous membranes are moist.     Pharynx: Oropharynx is clear.  Eyes:     Conjunctiva/sclera: Conjunctivae normal.  Neck:     Musculoskeletal: Normal range of motion. No neck rigidity.  Cardiovascular:     Rate and Rhythm: Normal rate and regular rhythm.     Heart sounds: Normal heart sounds. No murmur. No gallop.   Pulmonary:     Effort: Pulmonary effort is normal.     Breath sounds: No stridor. No wheezing, rhonchi or rales.  Abdominal:  General: Abdomen is protuberant. Bowel sounds are normal. There is no distension.     Palpations: There is no hepatomegaly.     Tenderness: There is no abdominal tenderness.  Musculoskeletal: Normal range of motion.     Right lower leg: 1+ Pitting Edema present.     Left lower leg: 1+ Pitting Edema present.  Lymphadenopathy:     Cervical: No cervical adenopathy.  Skin:    General: Skin is warm and dry.     Coloration: Skin is not pale.  Neurological:     General: No focal deficit present.  Psychiatric:        Mood and Affect: Mood normal.        Behavior: Behavior normal.     Lab Results  Component Value Date   WBC 5.3 05/07/2018   HGB 14.4 05/07/2018   HCT 44.7 05/07/2018   PLT 204 05/07/2018   GLUCOSE 116 (H) 05/07/2018   CHOL 190 07/18/2018   TRIG 299.0 (H) 07/18/2018   HDL 50.30 07/18/2018   LDLDIRECT 108.0  07/18/2018   LDLCALC 113 (H) 02/02/2017   ALT 15 05/07/2018   AST 14 (L) 05/07/2018   NA 138 05/07/2018   K 4.2 05/07/2018   CL 106 05/07/2018   CREATININE 0.95 05/07/2018   BUN 20 05/07/2018   CO2 23 05/07/2018   TSH 0.68 11/15/2017   INR 1.0 02/21/2017   HGBA1C 7.6 (H) 07/18/2018   MICROALBUR 1.6 07/18/2018    Ct Chest W Contrast  Result Date: 05/08/2018 CLINICAL DATA:  78 year old female with history of lung cancer. Chemotherapy completed 10/19/2016. Immunotherapy complete December 2019. Shortness of breath on exertion. EXAM: CT CHEST WITH CONTRAST TECHNIQUE: Multidetector CT imaging of the chest was performed during intravenous contrast administration. CONTRAST:  3mL OMNIPAQUE IOHEXOL 300 MG/ML  SOLN COMPARISON:  Chest CT 12/29/2017. FINDINGS: Cardiovascular: Heart size is mildly enlarged. There is no significant pericardial fluid, thickening or pericardial calcification. There is aortic atherosclerosis, as well as atherosclerosis of the great vessels of the mediastinum and the coronary arteries, including calcified atherosclerotic plaque in the left main, left anterior descending, left circumflex and right coronary arteries. Mild thickening calcification of the aortic valve. Severe calcifications of the mitral annulus. Mediastinum/Nodes: No pathologically enlarged mediastinal or hilar lymph nodes. Esophagus is unremarkable in appearance. No axillary lymphadenopathy. Lungs/Pleura: There continues to be a mass-like area of architectural distortion in the medial aspect of the right lower lobe (axial image 95 of series 10) which currently measures approximately 2.3 x 5.1 cm, stable compared to prior examination, most compatible with an area of chronic postradiation masslike fibrosis. A few scattered tiny pulmonary nodules are again noted, including a 3 mm left upper lobe nodule (axial image 41 of series 10), and a 4 mm right upper lobe nodule (axial image 42 of series 10), stable compared to  prior examinations, favored to be benign. No other larger more suspicious appearing pulmonary nodules or masses are noted. No acute consolidative airspace disease. No pleural effusions. Upper Abdomen: Aortic atherosclerosis.  Status post cholecystectomy. Musculoskeletal: There are no aggressive appearing lytic or blastic lesions noted in the visualized portions of the skeleton. IMPRESSION: 1. Stable post treatment related changes of mass-like fibrosis in the medial aspect of the right lower lobe. No definite findings to suggest local recurrence of disease or definite metastatic disease in the thorax. 2. Mild cardiomegaly. 3. Aortic atherosclerosis, in addition to left main and 3 vessel coronary artery disease. Assessment for potential risk factor modification, dietary therapy  or pharmacologic therapy may be warranted, if clinically indicated. 4. There are calcifications of the aortic valve and mitral annulus. Echocardiographic correlation for evaluation of potential valvular dysfunction may be warranted if clinically indicated. Aortic Atherosclerosis (ICD10-I70.0). Electronically Signed   By: Vinnie Langton M.D.   On: 05/08/2018 09:46    Assessment & Plan:   Angelina was seen today for hyperlipidemia, diabetes and coronary artery disease.  Diagnoses and all orders for this visit:  Essential hypertension-blood pressure is adequately well controlled. -     Urinalysis, Routine w reflex microscopic; Future -     Cancel: TSH; Future  Coronary artery disease involving native coronary artery of native heart without angina pectoris-she has had no recent episodes of angina.  Will continue to work on risk factor modifications. -     Lipid panel; Future -     atorvastatin (LIPITOR) 20 MG tablet; Take 1 tablet (20 mg total) by mouth daily.  Type II diabetes mellitus with manifestations ([place])- Her A1c is at 7.6% which is an improvement.  I have asked her to transition from a DPP 4 inhibitor to an SGLT2  inhibitor.  I think this will help to gain better glycemic control and will also help reduce her risk of hospitalization for heart failure and cardiovascular events. -     Urinalysis, Routine w reflex microscopic; Future -     Hemoglobin A1c; Future -     Microalbumin / creatinine urine ratio; Future -     dapagliflozin propanediol (FARXIGA) 10 MG TABS tablet; Take 10 mg by mouth daily.  Hypertriglyceridemia- In addition to lifestyle modifications I have asked her to start taking an omega-3 fish oil to treat this. -     Lipid panel; Future -     omega-3 acid ethyl esters (LOVAZA) 1 g capsule; Take 2 capsules (2 g total) by mouth 2 (two) times daily.  Hyperlipidemia with target LDL less than 100- She has not achieved her LDL goal so I have asked her to start taking a statin for CV risk reduction. -     Lipid panel; Future -     Cancel: TSH; Future -     atorvastatin (LIPITOR) 20 MG tablet; Take 1 tablet (20 mg total) by mouth daily.  Atherosclerosis of aorta (HCC) -     atorvastatin (LIPITOR) 20 MG tablet; Take 1 tablet (20 mg total) by mouth daily.  Pneumonitis, radiation ([place])- Improvement noted.  Her cough is resolved.   I have discontinued Adrieana K. Ryer's COLLAGEN PO, benzonatate, and sitaGLIPtin. I am also having her start on atorvastatin, omega-3 acid ethyl esters, and Farxiga. Additionally, I am having her maintain her glucose blood, accu-chek soft touch, irbesartan, silver sulfADIAZINE, metFORMIN, furosemide, oxybutynin, diltiazem, and diltiazem.  Meds ordered this encounter  Medications  . atorvastatin (LIPITOR) 20 MG tablet    Sig: Take 1 tablet (20 mg total) by mouth daily.    Dispense:  90 tablet    Refill:  1  . omega-3 acid ethyl esters (LOVAZA) 1 g capsule    Sig: Take 2 capsules (2 g total) by mouth 2 (two) times daily.    Dispense:  360 capsule    Refill:  1  . dapagliflozin propanediol (FARXIGA) 10 MG TABS tablet    Sig: Take 10 mg by mouth daily.    Dispense:   90 tablet    Refill:  1     Follow-up: Return in about 4 months (around 11/17/2018).  Scarlette Calico, MD

## 2018-07-19 ENCOUNTER — Encounter: Payer: Self-pay | Admitting: Internal Medicine

## 2018-07-19 LAB — HEMOGLOBIN A1C: Hgb A1c MFr Bld: 7.6 % — ABNORMAL HIGH (ref 4.6–6.5)

## 2018-07-19 MED ORDER — FARXIGA 10 MG PO TABS: 10.0000 mg | ORAL_TABLET | Freq: Every day | ORAL | 1 refills | Status: DC

## 2018-07-19 MED ORDER — ATORVASTATIN CALCIUM 20 MG PO TABS: 20.0000 mg | ORAL_TABLET | Freq: Every day | ORAL | 1 refills | Status: DC

## 2018-07-19 MED ORDER — OMEGA-3-ACID ETHYL ESTERS 1 G PO CAPS: 2.0000 g | ORAL_CAPSULE | Freq: Two times a day (BID) | ORAL | 1 refills | Status: DC

## 2018-07-20 DIAGNOSIS — L237 Allergic contact dermatitis due to plants, except food: Secondary | ICD-10-CM | POA: Diagnosis not present

## 2018-07-27 ENCOUNTER — Telehealth: Payer: Self-pay | Admitting: Internal Medicine

## 2018-07-27 NOTE — Telephone Encounter (Signed)
Patient is having issues concerning medication dapagliflozin propanediol (FARXIGA). Patient states she is having aches and pains on her joints and believes its because her new medication with other medications.   Call back [phone]

## 2018-07-29 ENCOUNTER — Other Ambulatory Visit: Payer: Self-pay

## 2018-07-29 ENCOUNTER — Emergency Department (HOSPITAL_COMMUNITY)
Admission: EM | Admit: 2018-07-29 | Discharge: 2018-07-29 | Disposition: A | Discharge status: Home / Self Care | Payer: Medicare Other | Attending: Emergency Medicine | Admitting: Emergency Medicine

## 2018-07-29 ENCOUNTER — Encounter (HOSPITAL_COMMUNITY): Payer: Self-pay

## 2018-07-29 ENCOUNTER — Emergency Department (HOSPITAL_COMMUNITY): Payer: Medicare Other

## 2018-07-29 DIAGNOSIS — R42 Dizziness and giddiness: Secondary | ICD-10-CM | POA: Diagnosis not present

## 2018-07-29 DIAGNOSIS — R002 Palpitations: Secondary | ICD-10-CM

## 2018-07-29 DIAGNOSIS — I4891 Unspecified atrial fibrillation: Secondary | ICD-10-CM | POA: Diagnosis not present

## 2018-07-29 DIAGNOSIS — Z96652 Presence of left artificial knee joint: Secondary | ICD-10-CM | POA: Insufficient documentation

## 2018-07-29 DIAGNOSIS — E119 Type 2 diabetes mellitus without complications: Secondary | ICD-10-CM | POA: Diagnosis not present

## 2018-07-29 DIAGNOSIS — I10 Essential (primary) hypertension: Secondary | ICD-10-CM | POA: Diagnosis not present

## 2018-07-29 DIAGNOSIS — R0602 Shortness of breath: Secondary | ICD-10-CM | POA: Diagnosis not present

## 2018-07-29 DIAGNOSIS — R5383 Other fatigue: Secondary | ICD-10-CM | POA: Diagnosis not present

## 2018-07-29 DIAGNOSIS — I259 Chronic ischemic heart disease, unspecified: Secondary | ICD-10-CM | POA: Diagnosis not present

## 2018-07-29 DIAGNOSIS — Z7984 Long term (current) use of oral hypoglycemic drugs: Secondary | ICD-10-CM | POA: Diagnosis not present

## 2018-07-29 LAB — CBC WITH DIFFERENTIAL/PLATELET
Abs Immature Granulocytes: 0.02 10*3/uL (ref 0.00–0.07)
Basophils Absolute: 0 10*3/uL (ref 0.0–0.1)
Basophils Relative: 0 %
Eosinophils Absolute: 0.1 10*3/uL (ref 0.0–0.5)
Eosinophils Relative: 2 %
HCT: 42.7 % (ref 36.0–46.0)
Hemoglobin: 13.8 g/dL (ref 12.0–15.0)
Immature Granulocytes: 0 %
Lymphocytes Relative: 15 %
Lymphs Abs: 0.9 10*3/uL (ref 0.7–4.0)
MCH: 30.5 pg (ref 26.0–34.0)
MCHC: 32.3 g/dL (ref 30.0–36.0)
MCV: 94.5 fL (ref 80.0–100.0)
Monocytes Absolute: 0.7 10*3/uL (ref 0.1–1.0)
Monocytes Relative: 11 %
Neutro Abs: 4.3 10*3/uL (ref 1.7–7.7)
Neutrophils Relative %: 72 %
Platelets: 236 10*3/uL (ref 150–400)
RBC: 4.52 MIL/uL (ref 3.87–5.11)
RDW: 13.2 % (ref 11.5–15.5)
WBC: 6 10*3/uL (ref 4.0–10.5)
nRBC: 0 % (ref 0.0–0.2)

## 2018-07-29 LAB — URINALYSIS, ROUTINE W REFLEX MICROSCOPIC
Bilirubin Urine: NEGATIVE
Glucose, UA: >=500 mg/dL — AB
Hgb urine dipstick: NEGATIVE
Ketones, ur: NEGATIVE mg/dL
Leukocytes,Ua: NEGATIVE
Nitrite: NEGATIVE
Protein, ur: NEGATIVE mg/dL
Specific Gravity, Urine: 1.01 (ref 1.005–1.030)
pH: 7 (ref 5.0–8.0)

## 2018-07-29 LAB — COMPREHENSIVE METABOLIC PANEL
ALT: 19 U/L (ref 0–44)
AST: 23 U/L (ref 15–41)
Albumin: 3.3 g/dL — ABNORMAL LOW (ref 3.5–5.0)
Alkaline Phosphatase: 46 U/L (ref 38–126)
Anion gap: 12 (ref 5–15)
BUN: 24 mg/dL — ABNORMAL HIGH (ref 8–23)
CO2: 21 mmol/L — ABNORMAL LOW (ref 22–32)
Calcium: 9.2 mg/dL (ref 8.9–10.3)
Chloride: 103 mmol/L (ref 98–111)
Creatinine, Ser: 0.94 mg/dL (ref 0.44–1.00)
GFR calc Af Amer: >60 mL/min (ref >60)
GFR calc non Af Amer: 59 mL/min — ABNORMAL LOW (ref >60)
Glucose, Bld: 135 mg/dL — ABNORMAL HIGH (ref 70–99)
Potassium: 3.7 mmol/L (ref 3.5–5.1)
Sodium: 136 mmol/L (ref 135–145)
Total Bilirubin: 1 mg/dL (ref 0.3–1.2)
Total Protein: 6.7 g/dL (ref 6.5–8.1)

## 2018-07-29 LAB — BRAIN NATRIURETIC PEPTIDE: B Natriuretic Peptide: 89.7 pg/mL (ref 0.0–100.0)

## 2018-07-29 LAB — TROPONIN I (HIGH SENSITIVITY)
Troponin I (High Sensitivity): 21 ng/L — ABNORMAL HIGH (ref <18)
Troponin I (High Sensitivity): 22 ng/L — ABNORMAL HIGH (ref <18)

## 2018-07-29 LAB — MAGNESIUM: Magnesium: 2.1 mg/dL (ref 1.7–2.4)

## 2018-07-29 MED ORDER — APIXABAN 5 MG PO TABS: 5.0000 mg | ORAL_TABLET | Freq: Two times a day (BID) | ORAL | 0 refills | Status: DC

## 2018-07-29 NOTE — ED Provider Notes (Signed)
[facility] EMERGENCY DEPARTMENT Provider Note   CSN: [id number] Arrival date & time: 07/29/18  1340     History   Chief Complaint Chief Complaint  Patient presents with   Atrial Fibrillation   Dizziness    HPI Nicole [name] is a 78 y.o. female.     The history is provided by the patient and medical records. No language interpreter was used.  Palpitations Palpitations quality:  Irregular Onset quality:  Unable to specify Duration: at least 4 days. 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 Prox Cx lesion is 30% stenosed. Mid Cx lesion is 25% stenosed. Ost 3rd Mrg lesion is 30% stenosed. Prox LAD lesion is 25% stenosed. Mid LAD lesion is 25% stenosed. The left ventricular systolic function is normal. LV end diastolic pressure is normal. The left ventricular ejection fraction is 55-65% by visual estimate. There is no aortic valve stenosis.   N   Diabetes mellitus    type 2   Edema 07/12/2017   Essential hypertension 01/20/2009   Gallstones    Headache    History of radiation therapy 08/09/2016 to 09/19/2016   Right lung was treated to 60 Gy in 30 fractions at 2 Gy per fraction   Hypertension     Hypertriglyceridemia 08/22/2012   Low back pain    Lung mass    with lymphadenopathy   Osteoarthritis    Type II diabetes mellitus with manifestations ([place]) 01/20/2009   Estimated Creatinine Clearance: 49.8 mL/min (by C-G formula based on SCr of 1 mg/dL).    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[place]) 01/26/2017   Encounter for antineoplastic immunotherapy 10/12/2016   Cancer-related pain 10/06/2016   Atrial flutter ([place]) 10/05/2016   Adenocarcinoma of right lung, stage 3 ([name]) 07/28/2016   Encounter for antineoplastic chemotherapy 07/28/2016   Pancreatic cyst 11/09/2015   Routine general medical examination at a health care facility 06/30/2014   Gout 01/18/2013   Hypertriglyceridemia 08/22/2012   Osteopenia 10/31/2011   Type II diabetes mellitus with manifestations ([place]) 01/20/2009   Essential hypertension 01/20/2009   Osteoarthritis 01/20/2009    Past Surgical History:  Procedure Laterality Date   ABDOMINAL HYSTERECTOMY  2004   APPENDECTOMY  when 78 years old   COLONOSCOPY  08-05-09   [facility]   INCONTINENCE SURGERY     KNEE ARTHROSCOPY Left    LEFT HEART CATH AND CORONARY ANGIOGRAPHY N/A 02/24/2017   Procedure: LEFT HEART  CATH AND CORONARY ANGIOGRAPHY;  Surgeon: Jettie Booze, MD;  Location: [place] [facility];  Service: Cardiovascular;  Laterality: N/A;   LEFT HEART CATHETERIZATION WITH CORONARY ANGIOGRAM N/A 07/19/2013   Procedure: LEFT HEART CATHETERIZATION WITH CORONARY ANGIOGRAM;  Surgeon: Sinclair Grooms, MD;  Location: [facility] CATH LAB;  Service: Cardiovascular;  Laterality: N/A;   POLYPECTOMY  08-05-09   2 polyps   TOTAL KNEE ARTHROPLASTY Left  03/28/2012   Procedure: LEFT TOTAL KNEE ARTHROPLASTY;  Surgeon: Tobi Bastos, MD;  Location: [facility] OR[facility];  Service: Orthopedics;  Laterality: Left;   TUBAL LIGATION     VIDEO BRONCHOSCOPY WITH ENDOBRONCHIAL ULTRASOUND N/A 07/11/2016   Procedure: VIDEO BRONCHOSCOPY WITH ENDOBRONCHIAL ULTRASOUND;  Surgeon: Marshell Garfinkel, MD;  Location: [place];  Service: Pulmonary;  Laterality: N/A;     OB History   No obstetric history on file.      Home Medications    Prior to Admission medications   Medication Sig Start Date End Date Taking? Authorizing Provider  atorvastatin (LIPITOR) 20 MG tablet Take 1 tablet (20 mg total) by mouth daily. 07/19/18   [name], MD  dapagliflozin propanediol (FARXIGA) 10 MG TABS tablet Take 10 mg by mouth daily. 07/19/18   [name], MD  diltiazem (CARDIZEM CD) 360 MG 24 hr capsule Take 1 capsule (360 mg total) by mouth daily. 07/05/18   Imogene Burn, PA-C  diltiazem (CARDIZEM SR) 120 MG 12 hr capsule Take 3 capsules (360 mg total) by mouth daily. 07/05/18   Imogene Burn, PA-C  furosemide (LASIX) 20 MG tablet Take 20 mg by mouth as needed.    [provider]  glucose blood (ACCU-CHEK GUIDE) test strip 1 each by Other route daily. Use to check blood sugars daily DX code: E11.8 04/20/17   [name], MD  irbesartan (AVAPRO) 300 MG tablet TAKE 1 TABLET BY MOUTH ONCE DAILY 02/08/18   [name], MD  Lancets (ACCU-CHEK SOFT TOUCH) lancets Use to check blood sugars daily Dx E11.9 04/20/17   [name], MD  metFORMIN (GLUCOPHAGE-XR) 500 MG 24 hr tablet TAKE 2 TABLETS(1000 MG) BY MOUTH DAILY WITH BREAKFAST 05/09/18   Renato Shin, MD  omega-3 acid ethyl esters (LOVAZA) 1 g capsule Take 2 capsules (2 g total) by mouth 2 (two) times daily. 07/19/18   [name], MD  oxybutynin (DITROPAN-XL) 10 MG 24 hr tablet Take 10 mg by mouth at bedtime.    [provider]  silver sulfADIAZINE (SILVADENE) 1 % cream Apply 1 application topically  daily.    [provider]    Family History Family History  Problem Relation Age of Onset   Heart attack Father    Heart disease Father    Arthritis Other    Cancer Other        colon, lst degree relative   Diabetes Other        st degree relative   Hyperlipidemia Other    Hypertension Other    Colon cancer Neg Hx     Social History Social History   Tobacco Use   Smoking status: Never Smoker   Smokeless tobacco: Never Used  Substance Use Topics   Alcohol use: No    Alcohol/week: 0.0 standard drinks   Drug use: No     Allergies   No known allergies   Review of Systems Review of Systems  Constitutional: Positive for diaphoresis, fatigue and malaise/fatigue. Negative for chills and fever.  HENT: Negative for congestion.   Eyes: Negative for visual disturbance.  Respiratory: Positive for shortness of breath. Negative for cough, chest tightness and wheezing.   Cardiovascular: Positive for palpitations. Negative for chest pain, orthopnea, leg swelling, syncope and near-syncope.  Gastrointestinal: Positive for nausea. Negative for abdominal pain, constipation, diarrhea and vomiting.  Genitourinary: Negative for flank pain.  Musculoskeletal: Negative for back pain, neck pain and neck stiffness.  Skin: Negative for rash and wound.  Neurological: Positive for dizziness and light-headedness. Negative for speech difficulty, numbness and headaches.  Psychiatric/Behavioral: Negative for agitation.  All other systems reviewed and are negative.    Physical Exam Updated Vital Signs BP 125/74    Pulse 63    Temp (!) 97.1 F (36.2 C) (Oral)    Resp 20    Ht 5' 8"  (1.727 m)    Wt 82.6 kg    SpO2 98%    BMI 27.67 kg/m   Physical Exam Vitals signs and nursing note reviewed.  Constitutional:      General: She is not in acute distress.    Appearance: She is not ill-appearing, toxic-appearing or diaphoretic.  HENT:     Head: Normocephalic and atraumatic.      Nose: No congestion.  Eyes:     Pupils: Pupils are equal, round, and reactive to light.  Neck:     Musculoskeletal: No muscular tenderness.  Cardiovascular:     Rate and Rhythm: Tachycardia present. Rhythm irregular.     Pulses: Normal pulses.     Heart sounds: No murmur.  Pulmonary:     Effort: No respiratory distress.     Breath sounds: No wheezing, rhonchi or rales.  Chest:     Chest wall: No tenderness.  Abdominal:     General: Abdomen is flat. Bowel sounds are normal. There is no distension.     Tenderness: There is no abdominal tenderness. There is no right CVA tenderness, left CVA tenderness, guarding or rebound.  Musculoskeletal:        General: No tenderness.     Right lower leg: No edema.     Left lower leg: No edema.  Skin:    Capillary Refill: Capillary refill takes less than 2 seconds.  Neurological:     Mental Status: She is alert and oriented to person, place, and time.      ED Treatments / Results  Labs (all labs ordered are listed, but only abnormal results are displayed) Labs Reviewed  COMPREHENSIVE METABOLIC PANEL - Abnormal; Notable for the following components:      Result Value   CO2 21 (*)    Glucose, Bld 135 (*)    BUN 24 (*)    Albumin 3.3 (*)    GFR calc non Af Amer 59 (*)    All other components within normal limits  URINALYSIS, ROUTINE W REFLEX MICROSCOPIC - Abnormal; Notable for the following components:   APPearance HAZY (*)    Glucose, UA >=500 (*)    Bacteria, UA RARE (*)    All other components within normal limits  TROPONIN I (HIGH SENSITIVITY) - Abnormal; Notable for the following components:   Troponin I (High Sensitivity) 21 (*)    All other components within normal limits  CBC WITH DIFFERENTIAL/PLATELET  MAGNESIUM  TROPONIN I (HIGH SENSITIVITY)  BRAIN NATRIURETIC PEPTIDE    EKG EKG Interpretation  Date/Time:  Sunday July 29 2018 14:51:22 EDT Ventricular Rate:  72 PR Interval:    QRS Duration: 85 QT  Interval:  379 QTC Calculation: 415 R Axis:   47 Text Interpretation:  Atrial fibrillation Since prior ECG, rate has slowed, no other significant change Confirmed by Gareth Morgan [phone]) on 07/29/2018 3:40:41 PM   Radiology Dg Chest Portable 1 View  Result Date: 07/29/2018 CLINICAL DATA:  Shortness of breath. EXAM: PORTABLE CHEST 1 VIEW COMPARISON:  Radiographs of March 10, 2017. FINDINGS: The heart size and mediastinal contours are within normal limits. Atherosclerosis of thoracic aorta is noted. No pneumothorax or pleural effusion is noted. Both lungs are clear. The visualized skeletal structures are unremarkable. IMPRESSION: No active disease. Aortic Atherosclerosis (ICD10-I70.0). Electronically Signed   By: Marijo Conception M.D.   On: 07/29/2018 15:06    Procedures Procedures (including critical care time)  Medications Ordered in ED Medications - No data to display   Initial Impression / Assessment and Plan / ED Course  I have reviewed the triage vital signs and the nursing notes.  Pertinent labs & imaging results that were available during my care of the patient were reviewed by me and considered in my medical decision making (see chart for details).        Nicole [name] is a 78 y.o. female with a past medical history significant for CAD, atrial fibrillation not on anticoagulation due to prior GI bleed, diabetes, hypertension presents for palpitations.  Patient reports that last 4 days she has been having episodes of feeling very fatigued and feeling ".  Reports cardiologist follow-up.  She reports this morning it worsened that she was feeling lightheadedness, slightly dizzy, and short of breath.  She reports the palpitations.  She reports her pulse was in the 150s when she was diaphoretic.  She denies any chest pain during these episodes.  She denies fevers, chills, urinary symptoms, constipation, diarrhea, cough or congestion.  She denies any known exposure.  She reports her  legs have not been swollen or tender.  On exam, patient does have an irregular and fast heart rate.  Pulse was found to be anywhere between 80's and 130's.  EKG confirmed atrial fibrillation and no STEMI.  Lungs were clear chest was nontender.  Concerned patient may have been in A. fib for at least the last 4 days.  As she is not on blood thinners and it is unclear when this episode began, she is at an increased risk for stroke if she is cardioverted at this time.  Do not feel that her dizziness is related to a small stroke or TIA but likely related to the lightheadedness she was in A. fib with RVR with a rate of 150's.   Patient will have screening laboratory testing and imaging performed to look for electrolyte imbalance or occult infection which may have contributed to this episode of A. fib.   Anticipate consultation with cardiology to determine management and disposition given the unknown onset of symptoms or lack of anticoagulation.  Care transferred to Dr. Billy Fischer while awaiting for diagnostic work-up to be completed   Final Clinical Impressions(s) / ED Diagnoses   Final diagnoses:  Atrial fibrillation, unspecified type ([place])  Lightheadedness  Fatigue, unspecified type  Palpitations    ED Discharge Orders    None     Clinical Impression: 1. Atrial fibrillation, unspecified type ([place])   2. Lightheadedness   3. Fatigue, unspecified type   4. Palpitations     Disposition: Care transferred to Dr. Billy Fischer awaiting for diagnostic work-up  This note was prepared with assistance of Dragon voice recognition software. Occasional wrong-word  or sound-a-like substitutions may have occurred due to the inherent limitations of voice recognition software.     Tyce Delcid, Gwenyth Allegra, MD 07/29/18 [phone]

## 2018-07-29 NOTE — ED Provider Notes (Signed)
  Physical Exam  BP 119/67   Pulse 78   Temp (!) 97.1 F (36.2 C) (Oral)   Resp 20   Ht 5\' 8"  (1.727 m)   Wt 82.6 kg   SpO2 99%   BMI 27.67 kg/m   Physical Exam  ED Course/Procedures     Procedures  MDM    78 year old female with a history of atrial fibrillation not on anticoagulation at this time due to concerns previously of bleeding hemorrhoids and ability to care for her husband with an LVAD, adenocarcinoma, coronary artery disease with catheterization in January 2019, diabetes, hypertension, who presents with concern for dizziness.  Patient reports that since Thursday she has noted that her she has had intermittent palpitations and noted her heart rate to be higher than her baseline, and today around 1245 developed more severe palpitations with dizziness and shortness of breath.  Reports at that time her heart rate was in the 150s.  She denies any associated neurologic symptoms, and given association of dizziness with the elevated heart rate and A. fib, I suspect her symptoms are likely secondary to A. fib with RVR, and in absence of any other neurologic symptoms I have low suspicion for CVA or TIA.  She does not have hypoxia, and has hx of afib in the past with symptoms and do not have high suspicion for PE at this time.  CXR does not show signs of edema.  Suspect pt symptoms are secondary to symptomatic atrial fibrillation, with episode of RVR earlier today that has resolved.   In the emergency department she remains in atrial fibrillation, however is rate controlled, with stable blood pressures.  CBC and BMP without significant findings.  Troponin is elevated at 20.  Suspect this is likely secondary to demand ischemia in the setting of elevated heart rate noted previously today.  Delta troponin essentially unchanged.  Discussed with cardiology Dr. Ulysees Barns.  Pt ambulated without hypoxia, without significant change in HR (up to 101) which improved shortly after rest.   Again discussed  anticoagulation with patient, including discussion at this time I feel with her being in atrial fibrillation that benefits of anticoagulation outweigh the risks of bleeding hemorrhoids at this time.  She agrees, will initiate Eliquis.  Recommend follow up with afib clinic and discussed reasons to return to the emergency department in detail.      Gareth Morgan, MD 07/29/18 [phone]

## 2018-07-29 NOTE — ED Triage Notes (Addendum)
Pt arrives POV from home c/o "not feeling well" for the past week. Pt reports feeling dizzy and short of breath this morning. Pt has hx of afib (~ 2 years) dx'd post chemo and radiation. Pt reports HR 156 this morning. Pt denies CP and fever. Pt a&o x4

## 2018-07-29 NOTE — ED Notes (Signed)
Pt given hot water for her hot tea. Billy Fischer, MD sts it was ok.

## 2018-07-30 ENCOUNTER — Telehealth: Payer: Self-pay | Admitting: Interventional Cardiology

## 2018-07-30 ENCOUNTER — Telehealth: Payer: Self-pay | Admitting: Internal Medicine

## 2018-07-30 NOTE — Telephone Encounter (Signed)
New Message            Patient is calling to verify her dosage for Diltiazem 360 mg, patient is not sure what she is suppose to take. Pls advise

## 2018-07-30 NOTE — Telephone Encounter (Signed)
Pt called and stated that she had been taking the Lipitor that was prescribed for about 3-5 days and than her joints stared to feel fatigue and her back started to lock up and she was wondering if this was due to the LIPITOR and wanted to know what Dr. Ronnald Ramp would advise  Pt also stated that since taking the [facility] her blood sugar morning numbers keep creeping up to 180/200 and that is high for her/ please advise

## 2018-07-30 NOTE — Telephone Encounter (Signed)
She should stop taking Lipitor but please try to stay on Farxiga  TJ

## 2018-07-30 NOTE — Telephone Encounter (Signed)
Called and spoke to patient. Patient calling to make sure that she should only be taking diltiazem 360 mg. Made her aware that she should only take the 360 mg QD. Patient states that she is feeling okay today and that BP/HR have been normal.  Patient will keep her appt in the Afib Clinic on 7/8.

## 2018-07-31 NOTE — Telephone Encounter (Signed)
Pt contacted and informed of same and pt stated understanding.

## 2018-08-01 ENCOUNTER — Other Ambulatory Visit: Payer: Self-pay

## 2018-08-01 ENCOUNTER — Encounter (HOSPITAL_COMMUNITY): Payer: Self-pay | Admitting: Physician Assistant

## 2018-08-01 ENCOUNTER — Ambulatory Visit (HOSPITAL_COMMUNITY)
Admission: RE | Admit: 2018-08-01 | Discharge: 2018-08-01 | Disposition: A | Discharge status: Home / Self Care | Payer: Medicare Other | Source: Ambulatory Visit | Attending: Physician Assistant | Admitting: Physician Assistant

## 2018-08-01 VITALS — BP 126/78 | HR 89 | Ht 68.0 in | Wt 180.0 lb

## 2018-08-01 DIAGNOSIS — E663 Overweight: Secondary | ICD-10-CM | POA: Diagnosis not present

## 2018-08-01 DIAGNOSIS — Z79899 Other long term (current) drug therapy: Secondary | ICD-10-CM | POA: Diagnosis not present

## 2018-08-01 DIAGNOSIS — Z85118 Personal history of other malignant neoplasm of bronchus and lung: Secondary | ICD-10-CM | POA: Diagnosis not present

## 2018-08-01 DIAGNOSIS — Z800 Family history of malignant neoplasm of digestive organs: Secondary | ICD-10-CM | POA: Insufficient documentation

## 2018-08-01 DIAGNOSIS — I10 Essential (primary) hypertension: Secondary | ICD-10-CM | POA: Insufficient documentation

## 2018-08-01 DIAGNOSIS — E119 Type 2 diabetes mellitus without complications: Secondary | ICD-10-CM | POA: Insufficient documentation

## 2018-08-01 DIAGNOSIS — Z8541 Personal history of malignant neoplasm of cervix uteri: Secondary | ICD-10-CM | POA: Insufficient documentation

## 2018-08-01 DIAGNOSIS — R9431 Abnormal electrocardiogram [ECG] [EKG]: Secondary | ICD-10-CM | POA: Insufficient documentation

## 2018-08-01 DIAGNOSIS — Z6827 Body mass index (BMI) 27.0-27.9, adult: Secondary | ICD-10-CM | POA: Insufficient documentation

## 2018-08-01 DIAGNOSIS — Z8542 Personal history of malignant neoplasm of other parts of uterus: Secondary | ICD-10-CM | POA: Insufficient documentation

## 2018-08-01 DIAGNOSIS — Z8249 Family history of ischemic heart disease and other diseases of the circulatory system: Secondary | ICD-10-CM | POA: Diagnosis not present

## 2018-08-01 DIAGNOSIS — I4892 Unspecified atrial flutter: Secondary | ICD-10-CM | POA: Insufficient documentation

## 2018-08-01 DIAGNOSIS — I2510 Atherosclerotic heart disease of native coronary artery without angina pectoris: Secondary | ICD-10-CM | POA: Diagnosis not present

## 2018-08-01 DIAGNOSIS — Z96652 Presence of left artificial knee joint: Secondary | ICD-10-CM | POA: Diagnosis not present

## 2018-08-01 DIAGNOSIS — E781 Pure hyperglyceridemia: Secondary | ICD-10-CM | POA: Diagnosis not present

## 2018-08-01 DIAGNOSIS — I480 Paroxysmal atrial fibrillation: Secondary | ICD-10-CM | POA: Diagnosis not present

## 2018-08-01 DIAGNOSIS — Z923 Personal history of irradiation: Secondary | ICD-10-CM | POA: Diagnosis not present

## 2018-08-01 DIAGNOSIS — Z833 Family history of diabetes mellitus: Secondary | ICD-10-CM | POA: Insufficient documentation

## 2018-08-01 DIAGNOSIS — Z7901 Long term (current) use of anticoagulants: Secondary | ICD-10-CM | POA: Diagnosis not present

## 2018-08-01 LAB — TSH: TSH: 0.427 u[IU]/mL (ref 0.350–4.500)

## 2018-08-01 MED ORDER — METOPROLOL TARTRATE 25 MG PO TABS: ORAL_TABLET | ORAL | 1 refills | Status: AC

## 2018-08-01 NOTE — Patient Instructions (Signed)
Metoprolol 25mg  take 1 tablet every 8 hours as needed for heart racing.

## 2018-08-01 NOTE — Progress Notes (Signed)
Primary Care Physician: [name], MD Primary Cardiologist: Dr Irish Lack Primary Electrophysiologist: none Referring Physician: [facility]   Nicole [name] is a 78 y.o. female with a history of paroxysmal atrial fibrillation, atrial flutter, HTN, DM, CAD, and lung cancer s/p radiation who presents for consultation in the [facility].  The patient was initially diagnosed with atrial fibrillation in 2018 after presenting with symptoms of a rapid, irregular pulse. She was initially on Eliquis but developed rectal bleeding and this was stopped. Patient was seen in ER on 07/29/18 with symptoms of dizziness and palpitations and was found to be in afib with RVR. She was restarted on Eliquis and discharged in rate controlled afib. Today, patient reports that she has had no further heart racing or palpitations. She is in SR today. 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 No anginal symptoms.   Today, she denies symptoms of palpitations, chest pain, orthopnea, PND, dizziness, presyncope, syncope, snoring, daytime somnolence, bleeding, or neurologic sequela. The patient is tolerating medications without difficulties and is otherwise without complaint today.    Atrial Fibrillation Risk Factors:  she does not have symptoms or diagnosis of sleep apnea.   she has a BMI of Body mass index is 27.37 kg/m.[name] Filed Weights   08/01/18 0857  Weight: 81.6 kg    Family History  Problem Relation Age of Onset  . Heart attack Father   . Heart disease Father   . Arthritis Other   . Cancer Other        colon, lst degree relative  . Diabetes Other        st degree relative  . Hyperlipidemia Other   . Hypertension Other   . Colon cancer Neg Hx      Atrial Fibrillation Management history:  Previous antiarrhythmic drugs: none Previous cardioversions: none Previous ablations: none  CHADS2VASC score: 6 Anticoagulation history: Eliquis   Past Medical History:  Diagnosis Date  . A-fib ([place])   . Adenocarcinoma of right lung, stage 3 ([place]) 07/28/2016  . Atrial flutter ([place]) 10/05/2016  . Bronchitis    hx of  . 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 Cancer-related pain 10/06/2016  . Coronary artery disease 02/21/2017   JAN 2019 Prox RCA lesion is 25% stenosed. Prox Cx lesion is 30% stenosed. Mid Cx lesion is 25% stenosed. Ost 3rd Mrg lesion is 30% stenosed. Prox LAD lesion is 25% stenosed. Mid LAD lesion is 25% stenosed. The left ventricular systolic function is normal. LV end diastolic pressure is normal. The left ventricular ejection fraction is 55-65% by visual estimate. There is no aortic valve stenosis.   N  . Diabetes mellitus    type 2  . Edema 07/12/2017  . Essential hypertension 01/20/2009  . Gallstones   . Headache   . History of radiation therapy 08/09/2016 to 09/19/2016   Right lung was treated to 60 Gy in 30 fractions at 2 Gy per fraction  . Hypertension   . Hypertriglyceridemia 08/22/2012  . Low back pain   . Lung mass    with lymphadenopathy  . Osteoarthritis   . Type II diabetes mellitus with manifestations ([place]) 01/20/2009   Estimated Creatinine Clearance: 49.8 mL/min (by C-G formula based on SCr of 1 mg/dL).   Past Surgical History:  Procedure Laterality Date  . ABDOMINAL HYSTERECTOMY  2004  . APPENDECTOMY  when  78 years old  . COLONOSCOPY  08-05-09   [name]  . INCONTINENCE SURGERY    . KNEE ARTHROSCOPY Left   . LEFT HEART CATH AND CORONARY ANGIOGRAPHY N/A 02/24/2017   Procedure: LEFT HEART CATH AND CORONARY ANGIOGRAPHY;  Surgeon: Jettie Booze, MD;  Location: [place] [facility];  Service: Cardiovascular;  Laterality: N/A;  . LEFT HEART CATHETERIZATION WITH CORONARY ANGIOGRAM N/A 07/19/2013   Procedure: LEFT HEART CATHETERIZATION WITH CORONARY ANGIOGRAM;  Surgeon: Sinclair Grooms, MD;  Location: [facility] CATH LAB;  Service: Cardiovascular;   Laterality: N/A;  . POLYPECTOMY  08-05-09   2 polyps  . TOTAL KNEE ARTHROPLASTY Left 03/28/2012   Procedure: LEFT TOTAL KNEE ARTHROPLASTY;  Surgeon: Tobi Bastos, MD;  Location: [facility];  Service: Orthopedics;  Laterality: Left;  . TUBAL LIGATION    . VIDEO BRONCHOSCOPY WITH ENDOBRONCHIAL ULTRASOUND N/A 07/11/2016   Procedure: VIDEO BRONCHOSCOPY WITH ENDOBRONCHIAL ULTRASOUND;  Surgeon: Marshell Garfinkel, MD;  Location: [place];  Service: Pulmonary;  Laterality: N/A;    Current Outpatient Medications  Medication Sig Dispense Refill  . apixaban (ELIQUIS) 5 MG TABS tablet Take 1 tablet (5 mg total) by mouth 2 (two) times daily. 60 tablet 0  . dapagliflozin propanediol (FARXIGA) 10 MG TABS tablet Take 10 mg by mouth daily. 90 tablet 1  . diltiazem (CARDIZEM CD) 360 MG 24 hr capsule Take 1 capsule (360 mg total) by mouth daily. 90 capsule 3  . furosemide (LASIX) 20 MG tablet Take 20 mg by mouth as needed.    [name] glucose blood (ACCU-CHEK GUIDE) test strip 1 each by Other route daily. Use to check blood sugars daily DX code: E11.8 100 each 3  . irbesartan (AVAPRO) 300 MG tablet TAKE 1 TABLET BY MOUTH ONCE DAILY 90 tablet 1  . Lancets (ACCU-CHEK SOFT TOUCH) lancets Use to check blood sugars daily Dx E11.9 100 each 3  . metFORMIN (GLUCOPHAGE-XR) 500 MG 24 hr tablet TAKE 2 TABLETS(1000 MG) BY MOUTH DAILY WITH BREAKFAST 180 tablet 3  . omega-3 acid ethyl esters (LOVAZA) 1 g capsule Take 2 capsules (2 g total) by mouth 2 (two) times daily. 360 capsule 1  . oxybutynin (DITROPAN-XL) 10 MG 24 hr tablet Take 10 mg by mouth at bedtime.    . silver sulfADIAZINE (SILVADENE) 1 % cream Apply 1 application topically daily.    [name] atorvastatin (LIPITOR) 20 MG tablet Take 1 tablet (20 mg total) by mouth daily. (Patient not taking: Reported on 08/01/2018) 90 tablet 1   No current facility-administered medications for this encounter.     Allergies  Allergen Reactions  . No Known Allergies     Social History    Socioeconomic History  . Marital status: Married    Spouse name: Not on file  . Number of children: Not on file  . Years of education: Not on file  . Highest education level: Not on file  Occupational History  . Occupation: retired    Fish farm manager: RETIRED  Social Needs  . Financial resource strain: Not on file  . Food insecurity    Worry: Not on file    Inability: Not on file  . Transportation needs    Medical: Not on file    Non-medical: Not on file  Tobacco Use  . Smoking status: Never Smoker  . Smokeless tobacco: Never Used  Substance and Sexual Activity  . Alcohol use: No    Alcohol/week: 0.0 standard drinks  . Drug use: No  . Sexual activity:  Not Currently  Lifestyle  . Physical activity    Days per week: Not on file    Minutes per session: Not on file  . Stress: Not on file  Relationships  . Social Herbalist on phone: Not on file    Gets together: Not on file    Attends religious service: Not on file    Active member of club or organization: Not on file    Attends meetings of clubs or organizations: Not on file    Relationship status: Not on file  . Intimate partner violence    Fear of current or ex partner: Not on file    Emotionally abused: Not on file    Physically abused: Not on file    Forced sexual activity: Not on file  Other Topics Concern  . Not on file  Social History Narrative   Regular exercise- Yes     ROS- All systems are reviewed and negative except as per the HPI above.  Physical Exam: Vitals:   08/01/18 0857  BP: 126/78  Pulse: 89  Weight: 81.6 kg  Height: _0  (1.727 m)    GEN- The patient is well appearing elderly female, alert and oriented x 3 today.   Head- normocephalic, atraumatic Eyes-  Sclera clear, conjunctiva pink Ears- hearing intact Oropharynx- clear Neck- supple  Lungs- Clear to ausculation bilaterally, normal work of breathing Heart- Regular rate and rhythm, no murmurs, rubs or gallops  GI- soft, NT,  ND, + BS Extremities- no clubbing, cyanosis. Trace edema MS- no significant deformity or atrophy Skin- no rash or lesion Psych- euthymic mood, full affect Neuro- strength and sensation are intact  Wt Readings from Last 3 Encounters:  08/01/18 81.6 kg  07/29/18 82.6 kg  07/18/18 83.5 kg    EKG today demonstrates SR HR 89, PR 170, QRS 84, QTc 425  Echo 10/05/16 demonstrated  - Left ventricle: Abnormal septal motion. There was mild concentric   hypertrophy. Systolic function was normal. The estimated ejection   fraction was in the range of 55% to 60%. Doppler parameters are   consistent with abnormal left ventricular relaxation (grade 1   diastolic dysfunction). - Aortic valve: Nodular calcification of the non coronary cusp.   There was mild regurgitation. - Mitral valve: Calcified annulus. Mildly thickened leaflets . - Atrial septum: A patent foramen ovale cannot be excluded. - Impressions: Abnormal GLS -12.4   Epic records are reviewed at length today  Assessment and Plan:  1. Paroxysmal atrial fibrillation/atrial flutter General education about atrial fibrillation discussed and questions answered.  Patient clearly outlined her health goals which consist of being healthy enough to continue to care for her husband.  Given infrequency of episodes, will hold on AAD therapy for now.  Will start metoprolol 25 mg PRN q12 hrs for heart racing. Continue diltiazem 360 mg daily Check TSH We also discussed her stroke risk as well as the risks and benefits of anticoagulation. Continue Eliquis 5 mg BID Recheck Bmet/CBC on f/u.  This patients CHA2DS2-VASc Score and unadjusted Ischemic Stroke Rate (% per year) is equal to 9.7 % stroke rate/year from a score of 6  Above score calculated as 1 point each if present [CHF, HTN, DM, Vascular=MI/PAD/Aortic Plaque, Age if 65-74, or Female] Above score calculated as 2 points each if present [Age > 75, or Stroke/TIA/TE]   2. Overweight  Body  mass index is 27.37 kg/m. Patient admits she has been more sedentary recently. Lifestyle modification was  discussed at length including regular exercise and weight reduction.  3. CAD Nonobstructive by [facility] 02/24/17. No anginal symptoms. Continue present therapy and risk factor modification.  4. HTN Stable, no changes today.   Follow up in AF clinic in one month.   [facility] [address] [place], [place] [address] [phone] 08/01/2018 9:42 AM

## 2018-08-07 ENCOUNTER — Telehealth (HOSPITAL_COMMUNITY): Payer: Self-pay

## 2018-08-08 ENCOUNTER — Telehealth (HOSPITAL_COMMUNITY): Payer: Self-pay

## 2018-08-10 ENCOUNTER — Telehealth (HOSPITAL_COMMUNITY): Payer: Self-pay | Admitting: Physician Assistant

## 2018-08-10 NOTE — Telephone Encounter (Signed)
Returned pt call regarding rectal bleeding.   Patient reports that for the last 5 days she has had a small amount of blood in her stool. Recall that she has had this in the past on anticoagulation and has a history of hemorrhoids. Discussed with patient the risks and benefits of anticoagulation and her stroke risk. Given patient's CHADS2VASC score of 6, she has a high risk of stroke, would prefer that she continue Eliquis 5 mg BID for now. She has an established GI specialist and patient agrees to follow up for rectal bleeding. Patient denies any associated symptoms and feels well. BP and heart rate stable.

## 2018-08-15 ENCOUNTER — Telehealth: Payer: Self-pay

## 2018-08-15 NOTE — Telephone Encounter (Signed)
Covid-19 screening questions   Do you now or have you had a fever in the last 14 days? No  Do you have any respiratory symptoms of shortness of breath or cough now or in the last 14 days? No  Do you have any family members or close contacts with diagnosed or suspected Covid-19 in the past 14 days? No  Have you been tested for Covid-19 and found to be positive? No        

## 2018-08-16 ENCOUNTER — Encounter: Payer: Self-pay | Admitting: Gastroenterology

## 2018-08-16 ENCOUNTER — Ambulatory Visit: Payer: Medicare Other | Admitting: Gastroenterology

## 2018-08-16 VITALS — BP 116/68 | HR 89 | Temp 98.0°F | Ht 67.0 in | Wt 179.0 lb

## 2018-08-16 DIAGNOSIS — K644 Residual hemorrhoidal skin tags: Secondary | ICD-10-CM | POA: Diagnosis not present

## 2018-08-16 DIAGNOSIS — K625 Hemorrhage of anus and rectum: Secondary | ICD-10-CM | POA: Diagnosis not present

## 2018-08-16 MED ORDER — HYDROCORTISONE ACETATE 25 MG RE SUPP: 25.0000 mg | Freq: Every day | RECTAL | 1 refills | Status: DC

## 2018-08-16 MED ORDER — HYDROCORTISONE (PERIANAL) 2.5 % EX CREA: 1.0000 "application " | TOPICAL_CREAM | Freq: Three times a day (TID) | CUTANEOUS | 1 refills | Status: DC

## 2018-08-16 NOTE — Progress Notes (Addendum)
08/16/2018 BERTA DENSON [id number] [birth year]   HISTORY OF PRESENT ILLNESS: This is a 78 year old female who is a patient of Dr. Vena Rua.  She is here today with complaints of rectal bleeding.  This is in the setting of anticoagulation with Eliquis.  This bleeding started a few months ago and is described as bright red blood in the toilet bowl and on the toilet paper.  This only occurs with bowel movements.  She denies constipation or straining.  She had told her cardiologist about this and she held the Eliquis.  Upon holding the Eliquis the bleeding resolved.  She then resumed her Eliquis and once again the bleeding started once again.  She has been using some over-the-counter hydrocortisone cream on her external hemorrhoids, but obviously that has not been helping much to this point.  She denies any rectal pain or discomfort.  Her colonoscopy was in May 2019 by Dr. Hilarie Fredrickson.  She was found to have only medium and large size internal and external hemorrhoids as well as diverticulosis at that time.   Past Medical History:  Diagnosis Date  . A-fib ([place])   . Adenocarcinoma of right lung, stage 3 ([place]) 07/28/2016  . Atrial flutter ([place]) 10/05/2016  . Bronchitis    hx of  . 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 Cancer-related pain 10/06/2016  . Coronary artery disease 02/21/2017   JAN 2019 Prox RCA lesion is 25% stenosed. Prox Cx lesion is 30% stenosed. Mid Cx lesion is 25% stenosed. Ost 3rd Mrg lesion is 30% stenosed. Prox LAD lesion is 25% stenosed. Mid LAD lesion is 25% stenosed. The left ventricular systolic function is normal. LV end diastolic pressure is normal. The left ventricular ejection fraction is 55-65% by visual estimate. There is no aortic valve stenosis.   N  . Diabetes mellitus    type 2  . Edema 07/12/2017  . Essential hypertension 01/20/2009  . Gallstones   . Headache   . History of radiation therapy 08/09/2016 to 09/19/2016   Right lung was treated to 60 Gy in 30  fractions at 2 Gy per fraction  . Hypertension   . Hypertriglyceridemia 08/22/2012  . Low back pain   . Lung mass    with lymphadenopathy  . Osteoarthritis   . Type II diabetes mellitus with manifestations ([place]) 01/20/2009   Estimated Creatinine Clearance: 49.8 mL/min (by C-G formula based on SCr of 1 mg/dL).   Past Surgical History:  Procedure Laterality Date  . ABDOMINAL HYSTERECTOMY  2004  . APPENDECTOMY  when 78 years old  . COLONOSCOPY  08-05-09   [name]  . INCONTINENCE SURGERY    . KNEE ARTHROSCOPY Left   . LEFT HEART CATH AND CORONARY ANGIOGRAPHY N/A 02/24/2017   Procedure: LEFT HEART CATH AND CORONARY ANGIOGRAPHY;  Surgeon: Jettie Booze, MD;  Location: [place] [facility];  Service: Cardiovascular;  Laterality: N/A;  . LEFT HEART CATHETERIZATION WITH CORONARY ANGIOGRAM N/A 07/19/2013   Procedure: LEFT HEART CATHETERIZATION WITH CORONARY ANGIOGRAM;  Surgeon: Sinclair Grooms, MD;  Location: [facility] CATH LAB;  Service: Cardiovascular;  Laterality: N/A;  . POLYPECTOMY  08-05-09   2 polyps  . TOTAL KNEE ARTHROPLASTY Left 03/28/2012   Procedure: LEFT TOTAL KNEE ARTHROPLASTY;  Surgeon: Tobi Bastos, MD;  [facility];  Service: Orthopedics;  Laterality: Left;  . TUBAL LIGATION    . VIDEO BRONCHOSCOPY WITH ENDOBRONCHIAL ULTRASOUND N/A 07/11/2016   Procedure: VIDEO BRONCHOSCOPY WITH ENDOBRONCHIAL ULTRASOUND;  Surgeon: [name],  Hart Robinsons, MD;  Location: [place];  Service: Pulmonary;  Laterality: N/A;    reports that she has never smoked. She has never used smokeless tobacco. She reports that she does not drink alcohol or use drugs. family history includes Arthritis in an other family member; Cancer in an other family member; Diabetes in an other family member; Heart attack in her father; Heart disease in her father; Hyperlipidemia in an other family member; Hypertension in an other family member. Allergies  Allergen Reactions  . No Known Allergies       Outpatient Encounter  Medications as of 08/16/2018  Medication Sig  . apixaban (ELIQUIS) 5 MG TABS tablet Take 1 tablet (5 mg total) by mouth 2 (two) times daily.  . dapagliflozin propanediol (FARXIGA) 10 MG TABS tablet Take 10 mg by mouth daily.  [name] diltiazem (CARDIZEM CD) 360 MG 24 hr capsule Take 1 capsule (360 mg total) by mouth daily.  . furosemide (LASIX) 20 MG tablet Take 20 mg by mouth as needed.  [name] glucose blood (ACCU-CHEK GUIDE) test strip 1 each by Other route daily. Use to check blood sugars daily DX code: E11.8  . irbesartan (AVAPRO) 300 MG tablet TAKE 1 TABLET BY MOUTH ONCE DAILY  . Lancets (ACCU-CHEK SOFT TOUCH) lancets Use to check blood sugars daily Dx E11.9  . metFORMIN (GLUCOPHAGE-XR) 500 MG 24 hr tablet TAKE 2 TABLETS(1000 MG) BY MOUTH DAILY WITH BREAKFAST  . metoprolol tartrate (LOPRESSOR) 25 MG tablet Take 1 tablet every 8 hours AS NEEDED for rapid heart rate  . omega-3 acid ethyl esters (LOVAZA) 1 g capsule Take 2 capsules (2 g total) by mouth 2 (two) times daily.  . [DISCONTINUED] atorvastatin (LIPITOR) 20 MG tablet Take 1 tablet (20 mg total) by mouth daily. (Patient not taking: Reported on 08/01/2018)  . [DISCONTINUED] oxybutynin (DITROPAN-XL) 10 MG 24 hr tablet Take 10 mg by mouth at bedtime.  . [DISCONTINUED] silver sulfADIAZINE (SILVADENE) 1 % cream Apply 1 application topically daily.   No facility-administered encounter medications on file as of 08/16/2018.      REVIEW OF SYSTEMS  : All other systems reviewed and negative except where noted in the History of Present Illness.   PHYSICAL EXAM: Temp 98 F (36.7 C)   Ht 5\' 7"  (1.702 m)   Wt 179 lb (81.2 kg)   BMI 28.04 kg/m  General: Well developed Asian female in no acute distress Head: Normocephalic and atraumatic Eyes:  Sclerae anicteric, conjunctiva pink. Ears: Normal auditory acuity. Rectal:  External hemorrhoids noted, one very irritated with an area or erosion on the hemorrhoid itself, no active bleeding, but very likely  source of bleeding.  DRE did not reveal any masses.  Light brown stool on exam glove.  Anoscopy revealed internal hemorrhoids, but no active bleeding seen there. Musculoskeletal: Symmetrical with no gross deformities  Skin: No lesions on visible extremities Extremities: No edema  Neurological: Alert oriented x 4, grossly non-focal Psychological:  Alert and cooperative. Normal mood and affect  ASSESSMENT AND PLAN: *Rectal bleeding:  Has a very irritated external hemorrhoid on exam with an area of erosion.  This is very likely the source of her bleeding in the setting of anticoagulation with Eliquis.  Hemorrhoid not painful.  I am going to treat her with both hydrocortisone suppositories at bedtime and hydrocortisone cream to be used externally 3 times a day for the next 10 days.  I have asked that she call us back in 10 to 14 days to speak with  my nurse and give her update on her symptoms.  If this continues to be an issue she may need surgical referral.   CC:  [name], MD   Addendum: Reviewed and agree with assessment and management plan. Pyrtle, Lajuan Lines, MD

## 2018-08-16 NOTE — Patient Instructions (Signed)
If you are age 76 or older, your body mass index should be between 23-30. Your Body mass index is 28.04 kg/m. If this is out of the aforementioned range listed, please consider follow up with your Primary Care Provider.  If you are age 27 or younger, your body mass index should be between 19-25. Your Body mass index is 28.04 kg/m. If this is out of the aformentioned range listed, please consider follow up with your Primary Care Provider.   We have sent the following medications to your pharmacy for you to pick up at your convenience: Hydrocortisone Suppositories Hydrocortisone Cream  Call back with an update in 10-14 days and ask to speak with Chong Sicilian, RN  Thank you for choosing me and [facility].   Alonza Bogus, PA-C

## 2018-08-27 IMAGING — PT NM PET TUM IMG INITIAL (PI) SKULL BASE T - THIGH
10 series · 24 of 25 positions shown · non-contrast
Comparison: Chest CT 06/28/2016

CLINICAL DATA: Initial treatment strategy for lung mass.

EXAM:
NUCLEAR MEDICINE PET SKULL BASE TO THIGH
TECHNIQUE: 13.14 mCi F-18 FDG was injected intravenously. Full-ring PET imaging
was performed from the skull base to thigh after the radiotracer. CT
data was obtained and used for attenuation correction and anatomic
localization.
FASTING BLOOD GLUCOSE:  Value: 151 mg/dl

[Series 3: ct wb 5.0 b30f · axial · 5.0mm · 0.98mm/px · z∈[-1604,-620]mm · 3 of 329 slices shown]
[im 1/329]
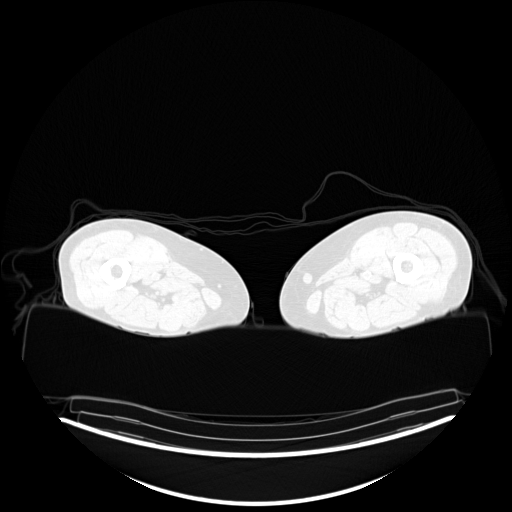
[im 165/329]
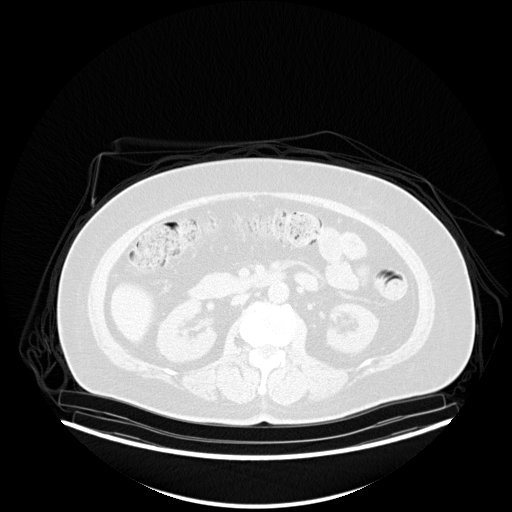
[im 329/329  brain]
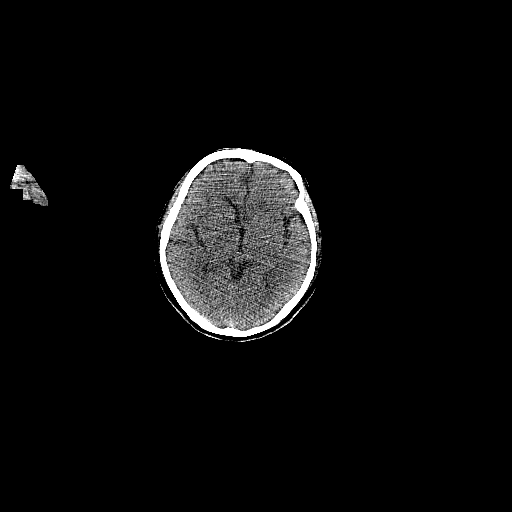

[Series 4: pet wb (ac) · axial · 5.0mm · 2.55mm/px · z∈[-1604,-620]mm · 3 of 329 slices shown]
[im 1/329]
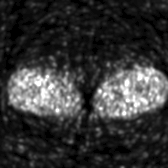
[im 165/329]
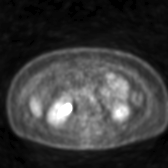
[im 329/329]
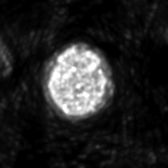

[Series 5: pet wb uncorrected (nac) · axial · 5.0mm · 4.07mm/px · z∈[-1604,-620]mm · 4 of 329 slices shown]
[im 1/329]
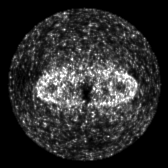
[im 110/329]
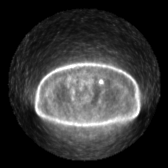
[im 219/329]
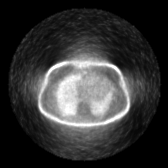
[im 329/329]
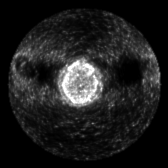

[Series 603: pet axial fused · 3 of 326 slices shown]
[im 1/326]
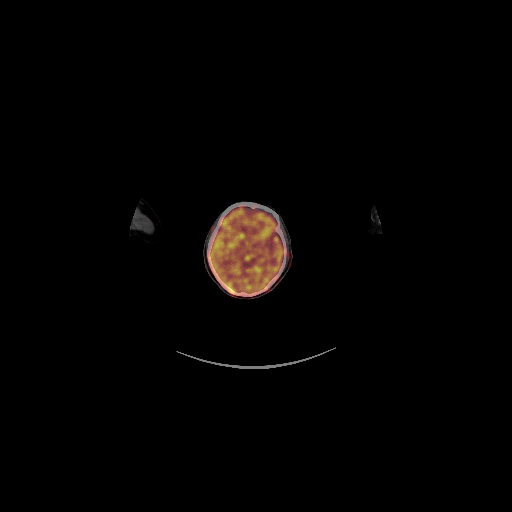
[im 109/326]
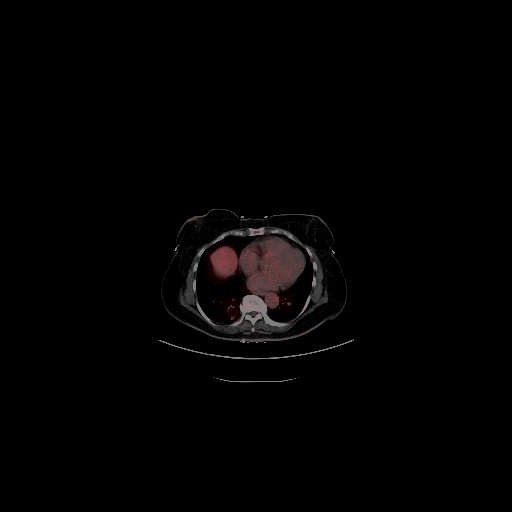
[im 326/326]
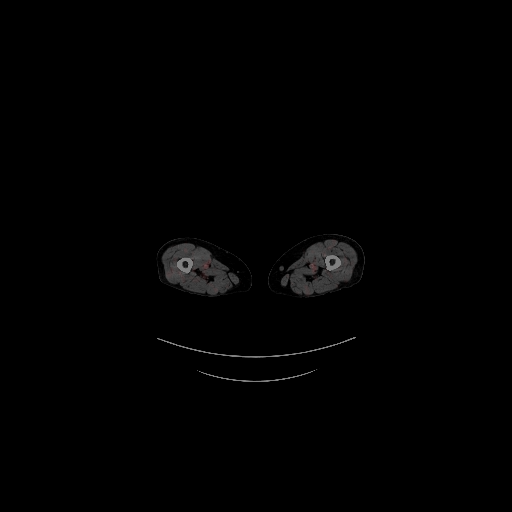

[Series 604: pet coronal fused · 1 of 76 slices shown]
[im 1/76]
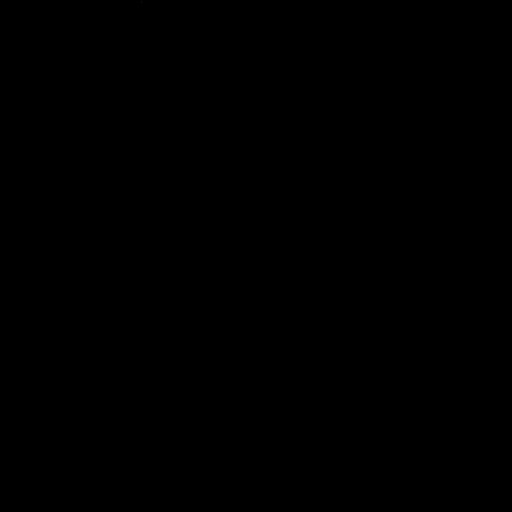

[Series 605: pet sagittal fused · 2 of 156 slices shown]
[im 1/156]
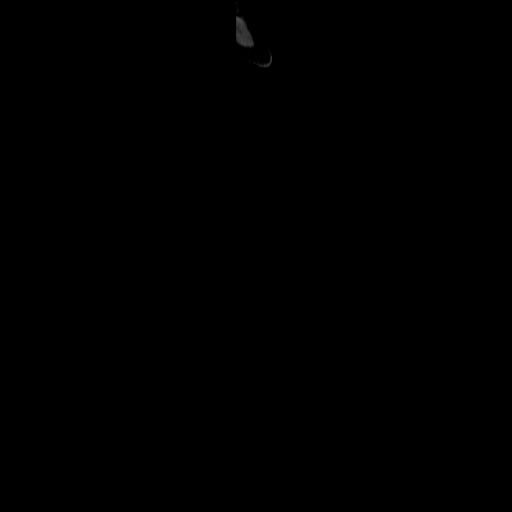
[im 156/156]
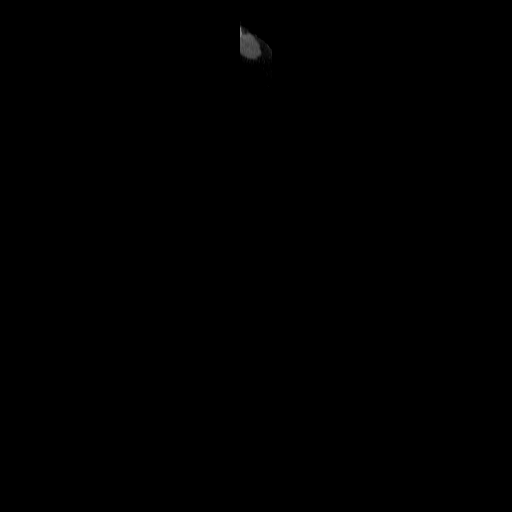

[Series 606: pet axial · 4 of 328 slices shown]
[im 1/328]
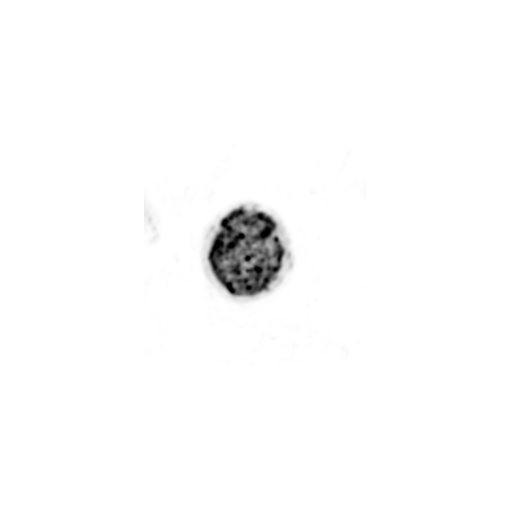
[im 110/328]
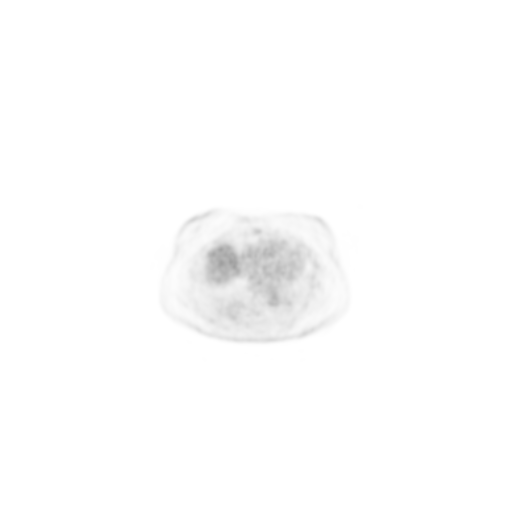
[im 219/328]
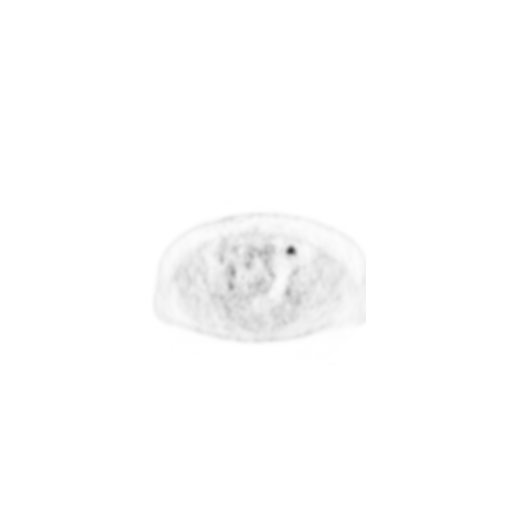
[im 328/328]
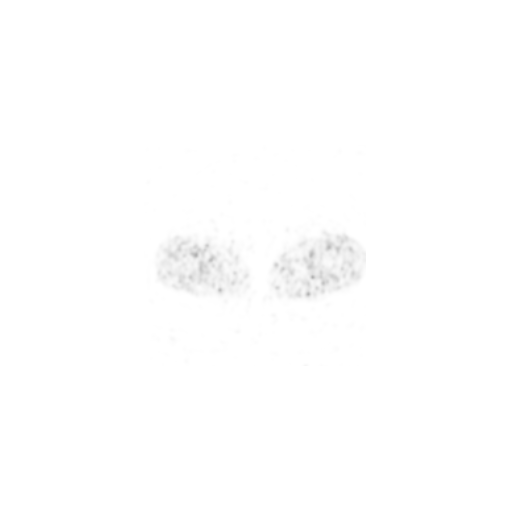

[Series 607: pet coronal · 1 of 101 slices shown]
[im 1/101]
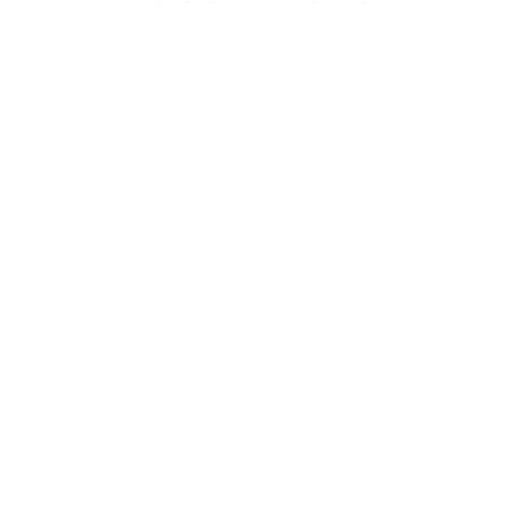

[Series 608: pet sagittal · 2 of 142 slices shown]
[im 1/142]
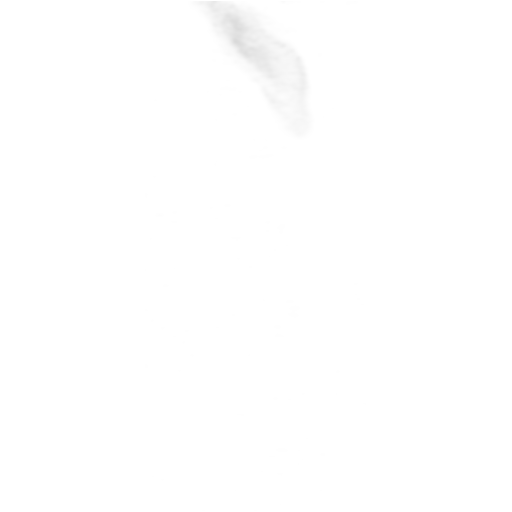
[im 142/142]
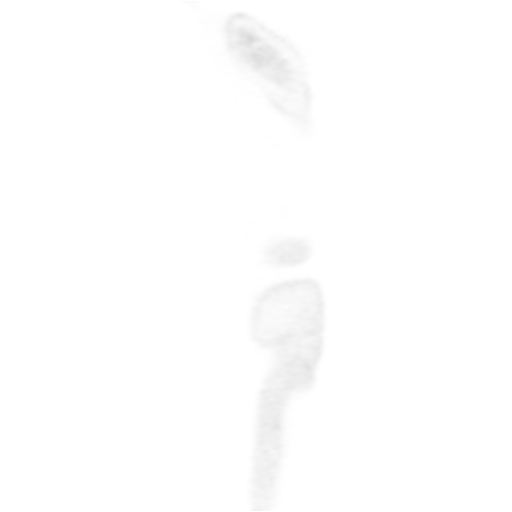

[Series 1038: results mm oncology reading · 1.01mm/px · 1 of 4 slices shown]
[im 1/4]
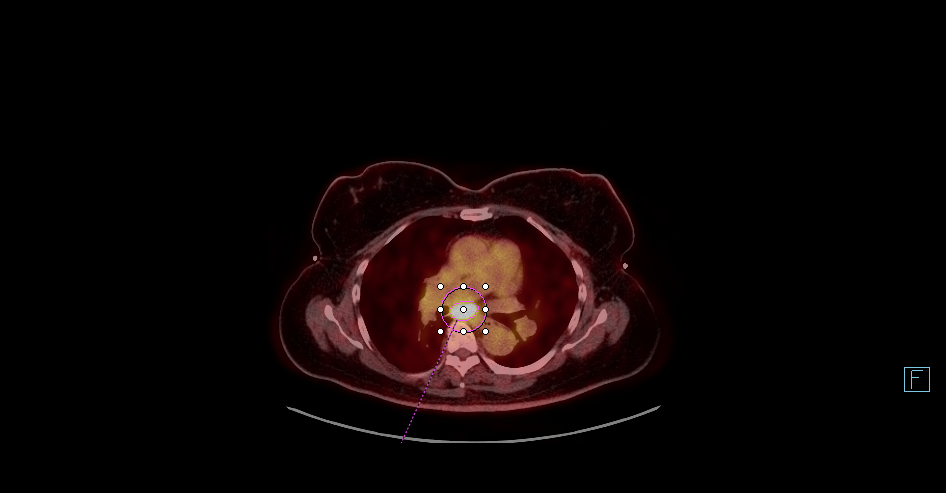

[24 of 25 positions shown; findings below may reference images not displayed]

FINDINGS: NECK

No hypermetabolic lymph nodes in the neck.

CHEST

Persistent irregular 2.3 cm right lower lobe pulmonary lesion is
weakly hypermetabolic with SUV max of 2.8.

2.4 cm subcarinal nodal lesion is hypermetabolic with SUV max of
8.6. There are also hypermetabolic right hilar lymph nodes with SUV
max of 4.9 and 3.6.

A few small scattered pulmonary nodular stable and 2 small on
accurately characterize on CT.

Stable aortic and coronary artery calcifications.

ABDOMEN/PELVIS

No abnormal hypermetabolic activity within the liver, pancreas,
adrenal glands, or spleen. No hypermetabolic lymph nodes in the
abdomen or pelvis.

SKELETON

No focal hypermetabolic activity to suggest skeletal metastasis.
IMPRESSION: 1. Weakly hypermetabolic 2.3 cm right lower lobe pulmonary lesion.
This is still suspicious for primary lung neoplasm given the more
metabolically active right hilar and subcarinal lymph nodes.
2. No findings for metastatic disease involving the back,
abdomen/pelvis or bony structures.
3. Small pulmonary nodules are stable and indeterminate. Recommend
attention on future scans.

## 2018-08-28 ENCOUNTER — Other Ambulatory Visit: Payer: Self-pay | Admitting: Internal Medicine

## 2018-08-28 DIAGNOSIS — I10 Essential (primary) hypertension: Secondary | ICD-10-CM

## 2018-08-28 DIAGNOSIS — E118 Type 2 diabetes mellitus with unspecified complications: Secondary | ICD-10-CM

## 2018-08-31 ENCOUNTER — Ambulatory Visit (HOSPITAL_COMMUNITY)
Admission: RE | Admit: 2018-08-31 | Discharge: 2018-08-31 | Disposition: A | Discharge status: Home / Self Care | Payer: Medicare Other | Source: Ambulatory Visit | Attending: Physician Assistant | Admitting: Physician Assistant

## 2018-08-31 ENCOUNTER — Other Ambulatory Visit: Payer: Self-pay

## 2018-08-31 VITALS — BP 156/84 | HR 89 | Ht 67.0 in | Wt 179.0 lb

## 2018-08-31 DIAGNOSIS — Z8249 Family history of ischemic heart disease and other diseases of the circulatory system: Secondary | ICD-10-CM | POA: Diagnosis not present

## 2018-08-31 DIAGNOSIS — I480 Paroxysmal atrial fibrillation: Secondary | ICD-10-CM | POA: Diagnosis not present

## 2018-08-31 DIAGNOSIS — E119 Type 2 diabetes mellitus without complications: Secondary | ICD-10-CM | POA: Insufficient documentation

## 2018-08-31 DIAGNOSIS — Z6828 Body mass index (BMI) 28.0-28.9, adult: Secondary | ICD-10-CM | POA: Diagnosis not present

## 2018-08-31 DIAGNOSIS — Z8542 Personal history of malignant neoplasm of other parts of uterus: Secondary | ICD-10-CM | POA: Insufficient documentation

## 2018-08-31 DIAGNOSIS — Z833 Family history of diabetes mellitus: Secondary | ICD-10-CM | POA: Diagnosis not present

## 2018-08-31 DIAGNOSIS — I4892 Unspecified atrial flutter: Secondary | ICD-10-CM | POA: Diagnosis not present

## 2018-08-31 DIAGNOSIS — Z79899 Other long term (current) drug therapy: Secondary | ICD-10-CM | POA: Diagnosis not present

## 2018-08-31 DIAGNOSIS — I2510 Atherosclerotic heart disease of native coronary artery without angina pectoris: Secondary | ICD-10-CM | POA: Diagnosis not present

## 2018-08-31 DIAGNOSIS — E781 Pure hyperglyceridemia: Secondary | ICD-10-CM | POA: Diagnosis not present

## 2018-08-31 DIAGNOSIS — I10 Essential (primary) hypertension: Secondary | ICD-10-CM | POA: Insufficient documentation

## 2018-08-31 DIAGNOSIS — Z8349 Family history of other endocrine, nutritional and metabolic diseases: Secondary | ICD-10-CM | POA: Insufficient documentation

## 2018-08-31 DIAGNOSIS — Z85118 Personal history of other malignant neoplasm of bronchus and lung: Secondary | ICD-10-CM | POA: Diagnosis not present

## 2018-08-31 DIAGNOSIS — Z7984 Long term (current) use of oral hypoglycemic drugs: Secondary | ICD-10-CM | POA: Diagnosis not present

## 2018-08-31 DIAGNOSIS — Z923 Personal history of irradiation: Secondary | ICD-10-CM | POA: Diagnosis not present

## 2018-08-31 DIAGNOSIS — Z96652 Presence of left artificial knee joint: Secondary | ICD-10-CM | POA: Insufficient documentation

## 2018-08-31 DIAGNOSIS — E663 Overweight: Secondary | ICD-10-CM | POA: Diagnosis not present

## 2018-08-31 DIAGNOSIS — Z7901 Long term (current) use of anticoagulants: Secondary | ICD-10-CM | POA: Insufficient documentation

## 2018-08-31 LAB — BASIC METABOLIC PANEL
Anion gap: 11 (ref 5–15)
BUN: 20 mg/dL (ref 8–23)
CO2: 25 mmol/L (ref 22–32)
Calcium: 9.6 mg/dL (ref 8.9–10.3)
Chloride: 102 mmol/L (ref 98–111)
Creatinine, Ser: 1.1 mg/dL — ABNORMAL HIGH (ref 0.44–1.00)
GFR calc Af Amer: 57 mL/min — ABNORMAL LOW (ref >60)
GFR calc non Af Amer: 49 mL/min — ABNORMAL LOW (ref >60)
Glucose, Bld: 132 mg/dL — ABNORMAL HIGH (ref 70–99)
Potassium: 4.2 mmol/L (ref 3.5–5.1)
Sodium: 138 mmol/L (ref 135–145)

## 2018-08-31 LAB — CBC
HCT: 44.3 % (ref 36.0–46.0)
Hemoglobin: 14.4 g/dL (ref 12.0–15.0)
MCH: 31 pg (ref 26.0–34.0)
MCHC: 32.5 g/dL (ref 30.0–36.0)
MCV: 95.5 fL (ref 80.0–100.0)
Platelets: 242 10*3/uL (ref 150–400)
RBC: 4.64 MIL/uL (ref 3.87–5.11)
RDW: 13.6 % (ref 11.5–15.5)
WBC: 5.1 10*3/uL (ref 4.0–10.5)
nRBC: 0 % (ref 0.0–0.2)

## 2018-08-31 MED ORDER — APIXABAN 5 MG PO TABS: 5.0000 mg | ORAL_TABLET | Freq: Two times a day (BID) | ORAL | 6 refills | Status: DC

## 2018-08-31 NOTE — Progress Notes (Signed)
Primary Care Physician: [name], MD Primary Cardiologist: Dr Irish Lack Primary Electrophysiologist: none Referring Physician: [facility]   Nicole [name] is a 78 y.o. female with a history of paroxysmal atrial fibrillation, atrial flutter, HTN, DM, CAD, and lung cancer s/p radiation who presents for consultation in the [facility].  The patient was initially diagnosed with atrial fibrillation in 2018 after presenting with symptoms of a rapid, irregular pulse. She was initially on Eliquis but developed rectal bleeding and this was stopped. Patient was seen in ER on 07/29/18 with symptoms of dizziness and palpitations and was found to be in afib with RVR. She was restarted on Eliquis and discharged in rate controlled afib. 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   On follow up today, patient reports that she has had two episodes of heart racing which resolved quickly with her PRN BB. She is still caring for her ill husband who, by her report, is in the final stages of his cancer. She reports that her rectal bleeding has resolved.  Overall, she feels well today.  Today, she denies symptoms of chest pain, orthopnea, PND, dizziness, presyncope, syncope, snoring, daytime somnolence, bleeding, or neurologic sequela. The patient is tolerating medications without difficulties and is otherwise without complaint today.    Atrial Fibrillation Risk Factors:  she does not have symptoms or diagnosis of sleep apnea.   she has a BMI of Body mass index is 28.04 kg/m.[name] Filed Weights   08/31/18 1140  Weight: 81.2 kg    Family History  Problem Relation Age of Onset  . Heart attack Father   . Heart disease Father   . Arthritis Other   . Cancer Other        colon, lst degree relative  . Diabetes Other        st degree relative  . Hyperlipidemia Other   . Hypertension Other    . Colon cancer Neg Hx      Atrial Fibrillation Management history:  Previous antiarrhythmic drugs: none Previous cardioversions: none Previous ablations: none CHADS2VASC score: 6 Anticoagulation history: Eliquis   Past Medical History:  Diagnosis Date  . A-fib ([place])   . Adenocarcinoma of right lung, stage 3 ([place]) 07/28/2016  . Atrial flutter ([place]) 10/05/2016  . Bronchitis    hx of  . 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 Cancer-related pain 10/06/2016  . Coronary artery disease 02/21/2017   JAN 2019 Prox RCA lesion is 25% stenosed. Prox Cx lesion is 30% stenosed. Mid Cx lesion is 25% stenosed. Ost 3rd Mrg lesion is 30% stenosed. Prox LAD lesion is 25% stenosed. Mid LAD lesion is 25% stenosed. The left ventricular systolic function is normal. LV end diastolic pressure is normal. The left ventricular ejection fraction is 55-65% by visual estimate. There is no aortic valve stenosis.   N  . Diabetes mellitus    type 2  . Edema 07/12/2017  . Essential hypertension 01/20/2009  . Gallstones   . Headache   . History of radiation therapy 08/09/2016 to 09/19/2016   Right lung was treated to 60 Gy in 30 fractions at 2 Gy per fraction  . Hypertension   . Hypertriglyceridemia 08/22/2012  . Low back pain   . Lung mass    with lymphadenopathy  . Osteoarthritis   . Type II diabetes mellitus with manifestations ([place]) 01/20/2009  Estimated Creatinine Clearance: 49.8 mL/min (by C-G formula based on SCr of 1 mg/dL).   Past Surgical History:  Procedure Laterality Date  . ABDOMINAL HYSTERECTOMY  2004  . APPENDECTOMY  when 78 years old  . COLONOSCOPY  08-05-09   [name]  . INCONTINENCE SURGERY    . KNEE ARTHROSCOPY Left   . LEFT HEART CATH AND CORONARY ANGIOGRAPHY N/A 02/24/2017   Procedure: LEFT HEART CATH AND CORONARY ANGIOGRAPHY;  Surgeon: Jettie Booze, MD;  Location: [place] [facility];  Service: Cardiovascular;  Laterality: N/A;  . LEFT HEART CATHETERIZATION WITH CORONARY  ANGIOGRAM N/A 07/19/2013   Procedure: LEFT HEART CATHETERIZATION WITH CORONARY ANGIOGRAM;  Surgeon: Sinclair Grooms, MD;  Location: [facility] C[facility] LAB;  Service: Cardiovascular;  Laterality: N/A;  . POLYPECTOMY  08-05-09   2 polyps  . TOTAL KNEE ARTHROPLASTY Left 03/28/2012   Procedure: LEFT TOTAL KNEE ARTHROPLASTY;  Surgeon: Tobi Bastos, MD;  Location: [facility];  Service: Orthopedics;  Laterality: Left;  . TUBAL LIGATION    . VIDEO BRONCHOSCOPY WITH ENDOBRONCHIAL ULTRASOUND N/A 07/11/2016   Procedure: VIDEO BRONCHOSCOPY WITH ENDOBRONCHIAL ULTRASOUND;  Surgeon: Marshell Garfinkel, MD;  Location: [place];  Service: Pulmonary;  Laterality: N/A;    Current Outpatient Medications  Medication Sig Dispense Refill  . apixaban (ELIQUIS) 5 MG TABS tablet Take 1 tablet (5 mg total) by mouth 2 (two) times daily. 60 tablet 6  . b complex vitamins tablet Take 1 tablet by mouth daily.    . calcium-vitamin D (OSCAL WITH D) 500-200 MG-UNIT tablet Take 2 tablets by mouth daily with breakfast.    . Collagenase POWD by Does not apply route 2 (two) times daily.    . dapagliflozin propanediol (FARXIGA) 10 MG TABS tablet Take 10 mg by mouth daily. 90 tablet 1  . diltiazem (CARDIZEM CD) 360 MG 24 hr capsule Take 1 capsule (360 mg total) by mouth daily. 90 capsule 3  . furosemide (LASIX) 20 MG tablet Take 20 mg by mouth as needed.    [name] glucose blood (ACCU-CHEK GUIDE) test strip 1 each by Other route daily. Use to check blood sugars daily DX code: E11.8 100 each 3  . hydrocortisone (ANUSOL-HC) 2.5 % rectal cream Place 1 application rectally 3 (three) times daily. Use for 10 days. 30 g 1  . irbesartan (AVAPRO) 300 MG tablet TAKE 1 TABLET BY MOUTH ONCE DAILY 90 tablet 1  . Lancets (ACCU-CHEK SOFT TOUCH) lancets Use to check blood sugars daily Dx E11.9 100 each 3  . metFORMIN (GLUCOPHAGE-XR) 500 MG 24 hr tablet TAKE 2 TABLETS(1000 MG) BY MOUTH DAILY WITH BREAKFAST 180 tablet 3  . metoprolol tartrate (LOPRESSOR) 25 MG tablet Take 1  tablet every 8 hours AS NEEDED for rapid heart rate 30 tablet 1  . Multiple Vitamins-Minerals (MULTIPLE VITAMINS/WOMENS PO) Take by mouth daily.    [name] omega-3 acid ethyl esters (LOVAZA) 1 g capsule Take 2 capsules (2 g total) by mouth 2 (two) times daily. 360 capsule 1  . PREBIOTIC PRODUCT PO Take 1 capsule by mouth daily.    . Turmeric 500 MG CAPS Take 1,000 mg by mouth daily.    . hydrocortisone (ANUSOL-HC) 25 MG suppository Place 1 suppository (25 mg total) rectally at bedtime. Use for 10 days. (Patient not taking: Reported on 08/31/2018) 10 suppository 1   No current facility-administered medications for this encounter.     Allergies  Allergen Reactions  . No Known Allergies     Social History  Socioeconomic History  . Marital status: Married    Spouse name: Not on file  . Number of children: Not on file  . Years of education: Not on file  . Highest education level: Not on file  Occupational History  . Occupation: retired    Fish farm manager: RETIRED  Social Needs  . Financial resource strain: Not on file  . Food insecurity    Worry: Not on file    Inability: Not on file  . Transportation needs    Medical: Not on file    Non-medical: Not on file  Tobacco Use  . Smoking status: Never Smoker  . Smokeless tobacco: Never Used  Substance and Sexual Activity  . Alcohol use: No    Alcohol/week: 0.0 standard drinks  . Drug use: No  . Sexual activity: Not Currently  Lifestyle  . Physical activity    Days per week: Not on file    Minutes per session: Not on file  . Stress: Not on file  Relationships  . Social Herbalist on phone: Not on file    Gets together: Not on file    Attends religious service: Not on file    Active member of club or organization: Not on file    Attends meetings of clubs or organizations: Not on file    Relationship status: Not on file  . Intimate partner violence    Fear of current or ex partner: Not on file    Emotionally abused: Not on file     Physically abused: Not on file    Forced sexual activity: Not on file  Other Topics Concern  . Not on file  Social History Narrative   Regular exercise- Yes     ROS- All systems are reviewed and negative except as per the HPI above.  Physical Exam: Vitals:   08/31/18 1140  BP: (!) 156/84  Pulse: 89  SpO2: 94%  Weight: 81.2 kg  Height: _0  (1.702 m)    GEN- The patient is well appearing elderly female, alert and oriented x 3 today.   HEENT-head normocephalic, atraumatic, sclera clear, conjunctiva pink, hearing intact, trachea midline. Lungs- Clear to ausculation bilaterally, normal work of breathing Heart- Regular rate and rhythm, no murmurs, rubs or gallops  GI- soft, NT, ND, + BS Extremities- no clubbing, cyanosis. Trace edema MS- no significant deformity or atrophy Skin- no rash or lesion Psych- euthymic mood, full affect Neuro- strength and sensation are intact   Wt Readings from Last 3 Encounters:  08/31/18 81.2 kg  08/16/18 81.2 kg  08/01/18 81.6 kg    EKG today demonstrates SR HR 89, PR 172, QRS 86, QTc 428  Echo 10/05/16 demonstrated  - Left ventricle: Abnormal septal motion. There was mild concentric   hypertrophy. Systolic function was normal. The estimated ejection   fraction was in the range of 55% to 60%. Doppler parameters are   consistent with abnormal left ventricular relaxation (grade 1   diastolic dysfunction). - Aortic valve: Nodular calcification of the non coronary cusp.   There was mild regurgitation. - Mitral valve: Calcified annulus. Mildly thickened leaflets . - Atrial septum: A patent foramen ovale cannot be excluded. - Impressions: Abnormal GLS -12.4   Epic records are reviewed at length today  Assessment and Plan:  1. Paroxysmal atrial fibrillation/atrial flutter Two brief episodes of palpitations which resolved quickly with PRN BB. Given infrequency of episodes, will hold on AAD therapy for now.  Continue metoprolol 25 mg  PRN q12 hrs for heart racing. Continue diltiazem 360 mg daily Continue Eliquis 5 mg BID. She is currently being treated for external hemorrhoids. Bleeding has resolved. Check Bmet/CBC today.  This patients CHA2DS2-VASc Score and unadjusted Ischemic Stroke Rate (% per year) is equal to 9.7 % stroke rate/year from a score of 6  Above score calculated as 1 point each if present [CHF, HTN, DM, Vascular=MI/PAD/Aortic Plaque, Age if 65-74, or Female] Above score calculated as 2 points each if present [Age > 75, or Stroke/TIA/TE]   2. Overweight  Body mass index is 28.04 kg/m. Lifestyle modification was discussed and encouraged including regular physical activity and weight reduction.  3. CAD Nonobstructive by [facility] 02/24/17. No anginal symptoms. Continue present therapy and risk factor modification.  4. HTN Elevated today, has been well controlled at previous visit and at home, no changes today.   Follow up with Dr Irish Lack in 3 months. AF clinic in 6 months.   [facility] [address] [place], [place] [address] [phone] 08/31/2018 12:19 PM

## 2018-09-07 ENCOUNTER — Ambulatory Visit (HOSPITAL_COMMUNITY)
Admission: RE | Admit: 2018-09-07 | Discharge: 2018-09-07 | Disposition: A | Discharge status: Home / Self Care | Payer: Medicare Other | Source: Ambulatory Visit | Attending: Internal Medicine | Admitting: Internal Medicine

## 2018-09-07 ENCOUNTER — Other Ambulatory Visit: Payer: Self-pay

## 2018-09-07 ENCOUNTER — Inpatient Hospital Stay: Payer: Medicare Other | Attending: Internal Medicine

## 2018-09-07 ENCOUNTER — Encounter (HOSPITAL_COMMUNITY): Payer: Self-pay

## 2018-09-07 DIAGNOSIS — C3491 Malignant neoplasm of unspecified part of right bronchus or lung: Secondary | ICD-10-CM | POA: Diagnosis not present

## 2018-09-07 DIAGNOSIS — Z7901 Long term (current) use of anticoagulants: Secondary | ICD-10-CM | POA: Insufficient documentation

## 2018-09-07 DIAGNOSIS — M1990 Unspecified osteoarthritis, unspecified site: Secondary | ICD-10-CM | POA: Insufficient documentation

## 2018-09-07 DIAGNOSIS — Z7984 Long term (current) use of oral hypoglycemic drugs: Secondary | ICD-10-CM | POA: Diagnosis not present

## 2018-09-07 DIAGNOSIS — I10 Essential (primary) hypertension: Secondary | ICD-10-CM | POA: Insufficient documentation

## 2018-09-07 DIAGNOSIS — Z923 Personal history of irradiation: Secondary | ICD-10-CM | POA: Diagnosis not present

## 2018-09-07 DIAGNOSIS — I4891 Unspecified atrial fibrillation: Secondary | ICD-10-CM | POA: Diagnosis not present

## 2018-09-07 DIAGNOSIS — C3490 Malignant neoplasm of unspecified part of unspecified bronchus or lung: Secondary | ICD-10-CM | POA: Diagnosis not present

## 2018-09-07 DIAGNOSIS — R0609 Other forms of dyspnea: Secondary | ICD-10-CM | POA: Insufficient documentation

## 2018-09-07 DIAGNOSIS — Z9221 Personal history of antineoplastic chemotherapy: Secondary | ICD-10-CM | POA: Insufficient documentation

## 2018-09-07 DIAGNOSIS — E119 Type 2 diabetes mellitus without complications: Secondary | ICD-10-CM | POA: Diagnosis not present

## 2018-09-07 DIAGNOSIS — R918 Other nonspecific abnormal finding of lung field: Secondary | ICD-10-CM | POA: Diagnosis not present

## 2018-09-07 DIAGNOSIS — Z79899 Other long term (current) drug therapy: Secondary | ICD-10-CM | POA: Diagnosis not present

## 2018-09-07 LAB — CBC WITH DIFFERENTIAL (CANCER CENTER ONLY)
Abs Immature Granulocytes: 0.02 10*3/uL (ref 0.00–0.07)
Basophils Absolute: 0 10*3/uL (ref 0.0–0.1)
Basophils Relative: 1 %
Eosinophils Absolute: 0.1 10*3/uL (ref 0.0–0.5)
Eosinophils Relative: 1 %
HCT: 42.8 % (ref 36.0–46.0)
Hemoglobin: 13.9 g/dL (ref 12.0–15.0)
Immature Granulocytes: 0 %
Lymphocytes Relative: 15 %
Lymphs Abs: 0.8 10*3/uL (ref 0.7–4.0)
MCH: 30.9 pg (ref 26.0–34.0)
MCHC: 32.5 g/dL (ref 30.0–36.0)
MCV: 95.1 fL (ref 80.0–100.0)
Monocytes Absolute: 0.5 10*3/uL (ref 0.1–1.0)
Monocytes Relative: 10 %
Neutro Abs: 3.9 10*3/uL (ref 1.7–7.7)
Neutrophils Relative %: 73 %
Platelet Count: 231 10*3/uL (ref 150–400)
RBC: 4.5 MIL/uL (ref 3.87–5.11)
RDW: 13.8 % (ref 11.5–15.5)
WBC Count: 5.3 10*3/uL (ref 4.0–10.5)
nRBC: 0 % (ref 0.0–0.2)

## 2018-09-07 LAB — CMP (CANCER CENTER ONLY)
ALT: 19 U/L (ref 0–44)
AST: 15 U/L (ref 15–41)
Albumin: 3.2 g/dL — ABNORMAL LOW (ref 3.5–5.0)
Alkaline Phosphatase: 62 U/L (ref 38–126)
Anion gap: 11 (ref 5–15)
BUN: 25 mg/dL — ABNORMAL HIGH (ref 8–23)
CO2: 23 mmol/L (ref 22–32)
Calcium: 9.2 mg/dL (ref 8.9–10.3)
Chloride: 106 mmol/L (ref 98–111)
Creatinine: 1.14 mg/dL — ABNORMAL HIGH (ref 0.44–1.00)
GFR, Est AFR Am: 54 mL/min — ABNORMAL LOW (ref >60)
GFR, Estimated: 47 mL/min — ABNORMAL LOW (ref >60)
Glucose, Bld: 135 mg/dL — ABNORMAL HIGH (ref 70–99)
Potassium: 4 mmol/L (ref 3.5–5.1)
Sodium: 140 mmol/L (ref 135–145)
Total Bilirubin: 0.6 mg/dL (ref 0.3–1.2)
Total Protein: 6.7 g/dL (ref 6.5–8.1)

## 2018-09-07 MED ORDER — IOHEXOL 300 MG/ML  SOLN
75.0000 mL | Freq: Once | INTRAMUSCULAR | Status: AC | PRN
  Administered 2018-09-07: 75 mL via INTRAVENOUS

## 2018-09-07 MED ORDER — SODIUM CHLORIDE (PF) 0.9 % IJ SOLN
INTRAMUSCULAR | Status: AC
  Filled 2018-09-07: qty 50

## 2018-09-10 ENCOUNTER — Telehealth: Payer: Self-pay | Admitting: Internal Medicine

## 2018-09-10 ENCOUNTER — Inpatient Hospital Stay: Payer: Medicare Other | Admitting: Internal Medicine

## 2018-09-10 ENCOUNTER — Encounter: Payer: Self-pay | Admitting: Internal Medicine

## 2018-09-10 ENCOUNTER — Other Ambulatory Visit: Payer: Self-pay

## 2018-09-10 VITALS — BP 111/76 | HR 95 | Temp 98.2°F | Resp 18 | Ht 67.0 in | Wt 176.3 lb

## 2018-09-10 DIAGNOSIS — E119 Type 2 diabetes mellitus without complications: Secondary | ICD-10-CM | POA: Diagnosis not present

## 2018-09-10 DIAGNOSIS — Z79899 Other long term (current) drug therapy: Secondary | ICD-10-CM | POA: Diagnosis not present

## 2018-09-10 DIAGNOSIS — Z7984 Long term (current) use of oral hypoglycemic drugs: Secondary | ICD-10-CM | POA: Diagnosis not present

## 2018-09-10 DIAGNOSIS — Z7901 Long term (current) use of anticoagulants: Secondary | ICD-10-CM | POA: Diagnosis not present

## 2018-09-10 DIAGNOSIS — C3491 Malignant neoplasm of unspecified part of right bronchus or lung: Secondary | ICD-10-CM

## 2018-09-10 DIAGNOSIS — Z923 Personal history of irradiation: Secondary | ICD-10-CM | POA: Diagnosis not present

## 2018-09-10 DIAGNOSIS — C3490 Malignant neoplasm of unspecified part of unspecified bronchus or lung: Secondary | ICD-10-CM | POA: Diagnosis not present

## 2018-09-10 DIAGNOSIS — Z9221 Personal history of antineoplastic chemotherapy: Secondary | ICD-10-CM | POA: Diagnosis not present

## 2018-09-10 DIAGNOSIS — I4891 Unspecified atrial fibrillation: Secondary | ICD-10-CM | POA: Diagnosis not present

## 2018-09-10 DIAGNOSIS — R0609 Other forms of dyspnea: Secondary | ICD-10-CM | POA: Diagnosis not present

## 2018-09-10 DIAGNOSIS — M1990 Unspecified osteoarthritis, unspecified site: Secondary | ICD-10-CM | POA: Diagnosis not present

## 2018-09-10 DIAGNOSIS — I10 Essential (primary) hypertension: Secondary | ICD-10-CM | POA: Diagnosis not present

## 2018-09-10 NOTE — Progress Notes (Signed)
[place] [phone]   [phone]  OFFICE PROGRESS NOTE  [name], MD [address] [facility] [address] [place] [address]  DIAGNOSIS: Stage IIIa (T1b, N2, M0) non-small cell lung cancer, adenocarcinoma presented with right lower lobe lung mass in addition to right hilar and subcarinal lymphadenopathy diagnosed in June 2018.  Biomarker Findings Tumor Mutational Burden - TMB-Intermediate (8 Muts/Mb) Microsatellite Status - Cannot Be Determined Genomic Findings For a complete list of the genes assayed, please refer to the Appendix. EGFR E709K, G719S WIO9BD Z329J MEQ6S3 splice site [id number]>I 7 Disease relevant genes with no reportable alterations: KRAS, ALK, BRAF, MET, ERBB2, RET, ROS1  PDL1 Expression 70%.  PRIOR THERAPY: 1) Concurrent chemoradiation with weekly carboplatin for AUC of 2 and paclitaxel 45 MG/M2. Status post 7 cycles with partial response. 2) Consolidation treatment with immunotherapy with Imfinzi (Durvalumab) 10 MG/KG every 2 weeks, first dose 10/19/2016.  Status post 5 cycles.  Discontinued secondary to intolerance and persistent pneumonitis.  CURRENT THERAPY: Observation.  INTERVAL HISTORY: Nicole [name] 78 y.o. female returns to the clinic today for 4 months follow-up visit.  The patient is feeling fine today with no concerning complaints except for shortness of breath with exertion.  She denied having any chest pain, cough or hemoptysis.  She denied having any recent weight loss or night sweats.  She has no nausea, vomiting, diarrhea or constipation.  She denied having any headache or visual changes.  She had repeat CT scan of the chest performed recently and she is here for evaluation and discussion of her scan results.  MEDICAL HISTORY: Past Medical History:  Diagnosis Date   A-fib [facility])    Adenocarcinoma of right lung, stage 3 ([place]) 07/28/2016   Atrial flutter ([place]) 10/05/2016   Bronchitis    hx of   Cancer  ([place]) 2004   uterine/cervical   Cancer-related pain 10/06/2016   Coronary artery disease 02/21/2017   JAN 2019 Prox RCA lesion is 25% stenosed. Prox Cx lesion is 30% stenosed. Mid Cx lesion is 25% stenosed. Ost 3rd Mrg lesion is 30% stenosed. Prox LAD lesion is 25% stenosed. Mid LAD lesion is 25% stenosed. The left ventricular systolic function is normal. LV end diastolic pressure is normal. The left ventricular ejection fraction is 55-65% by visual estimate. There is no aortic valve stenosis.   N   Diabetes mellitus    type 2   Edema 07/12/2017   Essential hypertension 01/20/2009   Gallstones    Headache    History of radiation therapy 08/09/2016 to 09/19/2016   Right lung was treated to 60 Gy in 30 fractions at 2 Gy per fraction   Hypertension    Hypertriglyceridemia 08/22/2012   Low back pain    Lung mass    with lymphadenopathy   Osteoarthritis    Type II diabetes mellitus with manifestations ([place]) 01/20/2009   Estimated Creatinine Clearance: 49.8 mL/min (by C-G formula based on SCr of 1 mg/dL).    ALLERGIES:  is allergic to no known allergies.  MEDICATIONS:  Current Outpatient Medications  Medication Sig Dispense Refill   apixaban (ELIQUIS) 5 MG TABS tablet Take 1 tablet (5 mg total) by mouth 2 (two) times daily. 60 tablet 6   b complex vitamins tablet Take 1 tablet by mouth daily.     calcium-vitamin D (OSCAL WITH D) 500-200 MG-UNIT tablet Take 2 tablets by mouth daily with breakfast.     Collagenase POWD by Does  not apply route 2 (two) times daily.     dapagliflozin propanediol (FARXIGA) 10 MG TABS tablet Take 10 mg by mouth daily. 90 tablet 1   Desoximetasone 0.05 % GEL APPLY TOPICALLY UTD AA BID     diltiazem (CARDIZEM CD) 360 MG 24 hr capsule Take 1 capsule (360 mg total) by mouth daily. 90 capsule 3   glucose blood (ACCU-CHEK GUIDE) test strip 1 each by Other route daily. Use to check blood sugars daily DX code: E11.8 100 each 3   hydrocortisone  (ANUSOL-HC) 2.5 % rectal cream Place 1 application rectally 3 (three) times daily. Use for 10 days. 30 g 1   irbesartan (AVAPRO) 300 MG tablet TAKE 1 TABLET BY MOUTH ONCE DAILY 90 tablet 1   Lancets (ACCU-CHEK SOFT TOUCH) lancets Use to check blood sugars daily Dx E11.9 100 each 3   metFORMIN (GLUCOPHAGE-XR) 500 MG 24 hr tablet TAKE 2 TABLETS(1000 MG) BY MOUTH DAILY WITH BREAKFAST 180 tablet 3   Multiple Vitamins-Minerals (MULTIPLE VITAMINS/WOMENS PO) Take by mouth daily.     omega-3 acid ethyl esters (LOVAZA) 1 g capsule Take 2 capsules (2 g total) by mouth 2 (two) times daily. 360 capsule 1   PREBIOTIC PRODUCT PO Take 1 capsule by mouth daily.     Turmeric 500 MG CAPS Take 1,000 mg by mouth daily.     furosemide (LASIX) 20 MG tablet Take 20 mg by mouth as needed.     hydrocortisone (ANUSOL-HC) 25 MG suppository Place 1 suppository (25 mg total) rectally at bedtime. Use for 10 days. (Patient not taking: Reported on 08/31/2018) 10 suppository 1   JANUVIA 100 MG tablet      metoprolol tartrate (LOPRESSOR) 25 MG tablet Take 1 tablet every 8 hours AS NEEDED for rapid heart rate (Patient not taking: Reported on 09/10/2018) 30 tablet 1   No current facility-administered medications for this visit.     SURGICAL HISTORY:  Past Surgical History:  Procedure Laterality Date   ABDOMINAL HYSTERECTOMY  2004   APPENDECTOMY  when 78 years old   COLONOSCOPY  08-05-09   [facility]   INCONTINENCE SURGERY     KNEE ARTHROSCOPY Left    LEFT HEART CATH AND CORONARY ANGIOGRAPHY N/A 02/24/2017   Procedure: LEFT HEART CATH AND CORONARY ANGIOGRAPHY;  Surgeon: Jettie Booze, MD;  Location: [place] [facility];  Service: Cardiovascular;  Laterality: N/A;   LEFT HEART CATHETERIZATION WITH CORONARY ANGIOGRAM N/A 07/19/2013   Procedure: LEFT HEART CATHETERIZATION WITH CORONARY ANGIOGRAM;  Surgeon: Sinclair Grooms, MD;  Location: [facility];  Service: Cardiovascular;  Laterality: N/A;   POLYPECTOMY   08-05-09   2 polyps   TOTAL KNEE ARTHROPLASTY Left 03/28/2012   Procedure: LEFT TOTAL KNEE ARTHROPLASTY;  Surgeon: Tobi Bastos, MD;  Location[facility];  Service: Orthopedics;  Laterality: Left;   TUBAL LIGATION     VIDEO BRONCHOSCOPY WITH ENDOBRONCHIAL ULTRASOUND N/A 07/11/2016   Procedure: VIDEO BRONCHOSCOPY WITH ENDOBRONCHIAL ULTRASOUND;  Surgeon: Marshell Garfinkel, MD;  Location: [place];  Service: Pulmonary;  Laterality: N/A;    REVIEW OF SYSTEMS:  A comprehensive review of systems was negative except for: Respiratory: positive for dyspnea on exertion   PHYSICAL EXAMINATION: General appearance: alert, cooperative and no distress Head: Normocephalic, without obvious abnormality, atraumatic Neck: no adenopathy, no JVD, supple, symmetrical, trachea midline and thyroid not enlarged, symmetric, no tenderness/mass/nodules Lymph nodes: Cervical, supraclavicular, and axillary nodes normal. Resp: clear to auscultation bilaterally Back: symmetric, no curvature. ROM normal. No  CVA tenderness. Cardio: regular rate and rhythm, S1, S2 normal, no murmur, click, rub or gallop GI: soft, non-tender; bowel sounds normal; no masses,  no organomegaly Extremities: extremities normal, atraumatic, no cyanosis or edema  ECOG PERFORMANCE STATUS: 1 - Symptomatic but completely ambulatory  Blood pressure 111/76, pulse 95, temperature 98.2 F (36.8 C), resp. rate 18, height 5' 7" (1.702 m), weight 176 lb 4.8 oz (80 kg), SpO2 100 %.  LABORATORY DATA: Lab Results  Component Value Date   WBC 5.3 09/07/2018   HGB 13.9 09/07/2018   HCT 42.8 09/07/2018   MCV 95.1 09/07/2018   PLT 231 09/07/2018      Chemistry      Component Value Date/Time   NA 140 09/07/2018 1014   NA 140 02/21/2017 1238   NA 139 01/26/2017 0915   K 4.0 09/07/2018 1014   K 4.1 01/26/2017 0915   CL 106 09/07/2018 1014   CO2 23 09/07/2018 1014   CO2 24 01/26/2017 0915   BUN 25 (H) 09/07/2018 1014   BUN 31 (H) 02/21/2017 1238   BUN  22.0 01/26/2017 0915   CREATININE 1.14 (H) 09/07/2018 1014   CREATININE 0.9 01/26/2017 0915      Component Value Date/Time   CALCIUM 9.2 09/07/2018 1014   CALCIUM 9.2 01/26/2017 0915   ALKPHOS 62 09/07/2018 1014   ALKPHOS 72 01/26/2017 0915   AST 15 09/07/2018 1014   AST 13 01/26/2017 0915   ALT 19 09/07/2018 1014   ALT 12 01/26/2017 0915   BILITOT 0.6 09/07/2018 1014   BILITOT 0.37 01/26/2017 0915       RADIOGRAPHIC STUDIES: Ct Chest W Contrast  Result Date: 09/07/2018 CLINICAL DATA:  Restaging lung cancer EXAM: CT CHEST WITH CONTRAST TECHNIQUE: Multidetector CT imaging of the chest was performed during intravenous contrast administration. CONTRAST:  49m OMNIPAQUE IOHEXOL 300 MG/ML  SOLN COMPARISON:  05/07/2018 FINDINGS: Cardiovascular: The heart size appears normal. No pericardial effusion identified. Aortic atherosclerosis. Three vessel coronary artery atherosclerotic calcifications Mediastinum/Nodes: Normal appearance of the thyroid gland. The trachea appears patent and is midline. Normal appearance of the esophagus. No enlarged mediastinal or hilar lymph nodes. Lungs/Pleura: No pleural effusion. Masslike area of architectural distortion within the medial right lower lobe is again noted measuring 1.8 x 3.4 cm, image 80/7. Previously 2.3 x 5.1 cm. A few scattered tiny nodules are again noted. These are unchanged from previous exam including 3 mm right upper lobe lung nodule, image 28/7. Right middle lobe lung nodule is unchanged measuring 4 mm, image 68/7. Upper Abdomen: No acute abnormality. Musculoskeletal: No chest wall abnormality. No acute or significant osseous findings. IMPRESSION: 1. Stable post treatment changes including masslike architectural distortion in the medial aspect of the right lower lobe. No definite findings to suggest local tumor recurrence or metastatic disease. 2. Aortic Atherosclerosis (ICD10-I70.0). Coronary artery calcifications. Electronically Signed   By:  TKerby MoorsM.D.   On: 09/07/2018 12:39    ASSESSMENT AND PLAN: This is a very pleasant 78years old Asian female recently diagnosed with a stage IIIa non-small cell4 lung cancer, adenocarcinoma. She underwent a course of concurrent chemoradiation with weekly carboplatin and paclitaxel status post 7 cycles. The patient tolerated the previous course fairly well except for mild odynophagia and dry cough. The patient was a started on consolidation treatment with immunotherapy with Imfinzi (Durvalumab) status post 5 cycles.  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 She was treated with a taper dose of prednisone and she felt much better.  The patient is currently on observation and she is feeling fine. She had repeat CT scan of the chest performed recently.  I personally and independently reviewed the scans and discussed the results with the patient today. The patient is doing fine and had a scan showed no concerning findings for disease recurrence or progression. I recommended for her to continue on observation with repeat CT scan of the chest in 4 months. She was advised to call immediately if she has any concerning symptoms in the interval. The patient voices understanding of current disease status and treatment options and is in agreement with the current care plan. All questions were answered. The patient knows to call the clinic with any problems, questions or concerns. We can certainly see the patient much sooner if necessary.  Disclaimer: This note was dictated with voice recognition software. Similar sounding words can inadvertently be transcribed and may not be corrected upon review.

## 2018-09-10 NOTE — Telephone Encounter (Signed)
Scheduled per 08/17 los, patient received avs and calender.

## 2018-10-19 NOTE — Progress Notes (Addendum)
Subjective:   Nicole [name] is a 78 y.o. female who presents for Medicare Annual (Subsequent) preventive examination.  Review of Systems:   Cardiac Risk Factors include: advanced age (>34mn, >>65women);diabetes mellitus;dyslipidemia;hypertension Sleep patterns: feels rested on waking, gets up 1-2 times nightly to void and sleeps 7-8 hours nightly.    Home Safety/Smoke Alarms: Feels safe in home. Smoke alarms in place.  Living environment; residence and Firearm Safety: 1-story house/ trailer. Lives with husband, no needs for DME, good support system Seat Belt Safety/Bike Helmet: Wears seat belt.     Objective:     Vitals: BP (!) 142/67   Pulse 70   Resp 17   Ht _0  (1.702 m)   Wt 173 lb (78.5 kg)   SpO2 99%   BMI 27.10 kg/m   Body mass index is 27.1 kg/m.  Advanced Directives 10/22/2018 07/29/2018 01/08/2018 06/22/2017 05/24/2017 03/10/2017 02/24/2017  Does Patient Have a Medical Advance Directive? Yes No Yes Yes Yes Yes No  Type of AParamedicof AShady PointLiving will - [place]Living will - [place]uck Run[place] will [place] will [place] Does patient want to make changes to medical advance directive? - - - - - - -  Copy of [place] Chart? No - copy requested - No - copy requested - - - No - copy requested  Would patient like information on creating a medical advance directive? - - - - - - No - Patient declined  Pre-existing out of facility DNR order (yellow form or pink MOST form) - - - - - - -    Tobacco Social History   Tobacco Use  Smoking Status Never Smoker  Smokeless Tobacco Never Used     Counseling given: Not Answered   Past Medical History:  Diagnosis Date  . A-fib ([place]   . Adenocarcinoma of right lung, stage 3 ([place] 07/28/2016  . Atrial flutter ([place] 10/05/2016  . Bronchitis    hx of  . Cancer ([facility] 2004   uterine/cervical  .  Cancer-related pain 10/06/2016  . Coronary artery disease 02/21/2017   JAN 2019 Prox RCA lesion is 25% stenosed. Prox Cx lesion is 30% stenosed. Mid Cx lesion is 25% stenosed. Ost 3rd Mrg lesion is 30% stenosed. Prox LAD lesion is 25% stenosed. Mid LAD lesion is 25% stenosed. The left ventricular systolic function is normal. LV end diastolic pressure is normal. The left ventricular ejection fraction is 55-65% by visual estimate. There is no aortic valve stenosis.   N  . Diabetes mellitus    type 2  . Edema 07/12/2017  . Essential hypertension 01/20/2009  . Gallstones   . Headache   . History of radiation therapy 08/09/2016 to 09/19/2016   Right lung was treated to 60 Gy in 30 fractions at 2 Gy per fraction  . Hypertension   . Hypertriglyceridemia 08/22/2012  . Low back pain   . Lung mass    with lymphadenopathy  . Osteoarthritis   . Type II diabetes mellitus with manifestations ([place] 01/20/2009   Estimated Creatinine Clearance: 49.8 mL/min (by C-G formula based on SCr of 1 mg/dL).   Past Surgical History:  Procedure Laterality Date  . ABDOMINAL HYSTERECTOMY  2004  . APPENDECTOMY  when 78years old  . [facility]ONOSCOPY  08-05-09   [facility] . INCONTINENCE [facility]GERY    . KNEE ARTHROSCOPY Left   . LEFT HEART CATH AND CORONARY  ANGIOGRAPHY N/A 02/24/2017   Procedure: LEFT HEART CATH AND CORONARY ANGIOGRAPHY;  Surgeon: Jettie Booze, MD;  Location: [place] [facility];  Service: Cardiovascular;  Laterality: N/A;  . LEFT HEART CATHETERIZATION WITH CORONARY ANGIOGRAM N/A 07/19/2013   Procedure: LEFT HEART CATHETERIZATION WITH CORONARY ANGIOGRAM;  Surgeon: Sinclair Grooms, MD;  Location: [facility];  Service: Cardiovascular;  Laterality: N/A;  . POLYPECTOMY  08-05-09   2 polyps  . TOTAL KNEE ARTHROPLASTY Left 03/28/2012   Procedure: LEFT TOTAL KNEE ARTHROPLASTY;  Surgeon: Tobi Bastos, MD;  [facility];  Service: Orthopedics;  Laterality: Left;  . TUBAL LIGATION    . VIDEO BRONCHOSCOPY WITH  ENDOBRONCHIAL ULTRASOUND N/A 07/11/2016   Procedure: VIDEO BRONCHOSCOPY WITH ENDOBRONCHIAL ULTRASOUND;  Surgeon: Marshell Garfinkel, MD;  Location: [place];  Service: Pulmonary;  Laterality: N/A;   Family History  Problem Relation Age of Onset  . Heart attack Father   . Heart disease Father   . Arthritis Other   . Cancer Other        colon, lst degree relative  . Diabetes Other        st degree relative  . Hyperlipidemia Other   . Hypertension Other   . Colon cancer Neg Hx    Social History   Socioeconomic History  . Marital status: Married    Spouse name: Not on file  . Number of children: 3  . Years of education: Not on file  . Highest education level: Not on file  Occupational History  . Occupation: retired    Fish farm manager: RETIRED  Social Needs  . Financial resource strain: Not hard at all  . Food insecurity    Worry: Never true    Inability: Never true  . Transportation needs    Medical: No    Non-medical: No  Tobacco Use  . Smoking status: Never Smoker  . Smokeless tobacco: Never Used  Substance and Sexual Activity  . Alcohol use: No    Alcohol/week: 0.0 standard drinks  . Drug use: No  . Sexual activity: Not Currently  Lifestyle  . Physical activity    Days per week: 0 days    Minutes per session: 0 min  . Stress: Only a little  Relationships  . Social connections    Talks on phone: More than three times a week    Gets together: More than three times a week    Attends religious service: 1 to 4 times per year    Active member of club or organization: Yes    Attends meetings of clubs or organizations: 1 to 4 times per year    Relationship status: Married  Other Topics Concern  . Not on file  Social History Narrative   Regular exercise- Yes    Outpatient Encounter Medications as of 10/22/2018  Medication Sig  . b complex vitamins tablet Take 1 tablet by mouth daily.  . calcium-vitamin D (OSCAL WITH D) 500-200 MG-UNIT tablet Take 2 tablets by mouth daily with  breakfast.  . Collagenase POWD by Does not apply route 2 (two) times daily.  . dapagliflozin propanediol (FARXIGA) 10 MG TABS tablet Take 10 mg by mouth daily.  . Desoximetasone 0.05 % GEL APPLY TOPICALLY UTD AA BID  . diltiazem (CARDIZEM CD) 360 MG 24 hr capsule Take 1 capsule (360 mg total) by mouth daily.  [name] glucose blood (ACCU-CHEK GUIDE) test strip 1 each by Other route daily. Use to check blood sugars daily DX code: E11.8  .  hydrocortisone (ANUSOL-HC) 2.5 % rectal cream Place 1 application rectally 3 (three) times daily. Use for 10 days.  . hydrocortisone (ANUSOL-HC) 25 MG suppository Place 1 suppository (25 mg total) rectally at bedtime. Use for 10 days.  . irbesartan (AVAPRO) 300 MG tablet TAKE 1 TABLET BY MOUTH ONCE DAILY  . Lancets (ACCU-CHEK SOFT TOUCH) lancets Use to check blood sugars daily Dx E11.9  . metFORMIN (GLUCOPHAGE-XR) 500 MG 24 hr tablet TAKE 2 TABLETS(1000 MG) BY MOUTH DAILY WITH BREAKFAST  . metoprolol tartrate (LOPRESSOR) 25 MG tablet Take 1 tablet every 8 hours AS NEEDED for rapid heart rate  . Multiple Vitamins-Minerals (MULTIPLE VITAMINS/WOMENS PO) Take by mouth daily.  [name] omega-3 acid ethyl esters (LOVAZA) 1 g capsule Take 2 capsules (2 g total) by mouth 2 (two) times daily.  [name] PREBIOTIC PRODUCT PO Take 1 capsule by mouth daily.  . Turmeric 500 MG CAPS Take 1,000 mg by mouth daily.  . [DISCONTINUED] dapagliflozin propanediol (FARXIGA) 10 MG TABS tablet Take 10 mg by mouth daily.  [name] apixaban (ELIQUIS) 5 MG TABS tablet Take 1 tablet (5 mg total) by mouth 2 (two) times daily.  . furosemide (LASIX) 20 MG tablet Take 20 mg by mouth as needed.  . [DISCONTINUED] JANUVIA 100 MG tablet    No facility-administered encounter medications on file as of 10/22/2018.     Activities of Daily Living In your present state of health, do you have any difficulty performing the following activities: 10/22/2018  Hearing? N  Vision? N  Difficulty concentrating or making decisions? N   Walking or climbing stairs? N  Dressing or bathing? N  Doing errands, shopping? N  Preparing Food and eating ? N  Using the Toilet? N  In the past six months, have you accidently leaked urine? N  Do you have problems with loss of bowel control? N  Managing your Medications? N  Managing your Finances? N  Housekeeping or managing your Housekeeping? N  Some recent data might be hidden    Patient Care Team: [name], MD as PCP - General Jettie Booze, MD as PCP - Cardiology (Cardiology)    Assessment:   This is a routine wellness examination for [place]. Physical assessment deferred to PCP.  Exercise Activities and Dietary recommendations Current Exercise Habits: The patient does not participate in regular exercise at present, Exercise limited by: None identified  Diet (meal preparation, eat out, water intake, caffeinated beverages, dairy products, fruits and vegetables): in general, a "healthy" diet  , well balanced   Reviewed heart healthy and diabetic diet. Encouraged patient to increase daily water and healthy fluid intake.  Goals    . Patient Stated     Work to lower my blood sugar by monitoring diet. Enjoy life and family. Go to [place] where I know of a hospital that will help me to get healthier. I want to travel to the [place] and see my family.       Fall Risk Fall Risk  10/22/2018 07/19/2018 03/07/2018 01/05/2017 10/12/2016  Falls in the past year? 0 0 0 No No  Number falls in past yr: 0 0 0 - -  Injury with Fall? 0 0 0 - -  Risk for fall due to : - - Impaired mobility;Impaired balance/gait - -  Follow up - Falls evaluation completed Falls evaluation completed;Education provided - -   Is the patient's home free of loose throw rugs in walkways, pet beds, electrical cords, etc?   yes  Grab bars in the bathroom? yes      Handrails on the stairs?   yes      Adequate lighting?   yes  Depression Screen PHQ 2/9 Scores 10/22/2018 03/07/2018 10/12/2016  06/15/2016  PHQ - 2 Score 1 0 0 0  PHQ- 9 Score - - - -     Cognitive Function       Ad8 score reviewed for issues:  Issues making decisions: no  Less interest in hobbies / activities: no  Repeats questions, stories (family complaining): no  Trouble using ordinary gadgets (microwave, computer, phone):no  Forgets the month or year: no  Mismanaging finances: no  Remembering appts: no  Daily problems with thinking and/or memory: no Ad8 score is= 0  Immunization History  Administered Date(s) Administered  . Fluad Quad(high Dose 65+) 10/22/2018  . Influenza Split 12/07/2010, 10/11/2011  . Influenza, High Dose Seasonal PF 11/09/2015, 09/20/2016, 11/15/2017  . Influenza,inj,Quad PF,6+ Mos 01/29/2013, 11/22/2013, 01/16/2015  . Influenza-Unspecified 10/24/2013  . Pneumococcal Conjugate-13 11/22/2013  . Pneumococcal Polysaccharide-23 05/07/2008, 01/16/2015  . Td 05/07/2008  . Varicella 09/06/2006  . Zoster 11/28/2011   Screening Tests Health Maintenance  Topic Date Due  . INFLUENZA VACCINE  08/25/2018  . TETANUS/TDAP  07/19/2019 (Originally 05/08/2018)  . HEMOGLOBIN A1C  01/17/2019  . FOOT EXAM  02/08/2019  . OPHTHALMOLOGY EXAM  02/08/2019  . DEXA SCAN  Completed  . PNA vac Low Risk Adult  Completed      Plan:     Reviewed health maintenance screenings with patient today and relevant education, vaccines, and/or referrals were provided.   I have personally reviewed and noted the following in the patient's chart:   . Medical and social history . Use of alcohol, tobacco or illicit drugs  . Current medications and supplements . Functional ability and status . Nutritional status . Physical activity . Advanced directives . List of other physicians . Vitals . Screenings to include cognitive, depression, and falls . Referrals and appointments  In addition, I have reviewed and discussed with patient certain preventive protocols, quality metrics, and best practice  recommendations. A written personalized care plan for preventive services as well as general preventive health recommendations were provided to patient.     Michiel Cowboy, RN  10/22/2018   Medical screening examination/treatment/procedure(s) were performed by non-physician practitioner and as supervising physician I was immediately available for consultation/collaboration. I agree with above. Scarlette Calico, MD

## 2018-10-22 ENCOUNTER — Ambulatory Visit (INDEPENDENT_AMBULATORY_CARE_PROVIDER_SITE_OTHER): Payer: Medicare Other | Admitting: *Deleted

## 2018-10-22 ENCOUNTER — Other Ambulatory Visit: Payer: Self-pay

## 2018-10-22 VITALS — BP 142/67 | HR 70 | Resp 17 | Ht 67.0 in | Wt 173.0 lb

## 2018-10-22 DIAGNOSIS — E118 Type 2 diabetes mellitus with unspecified complications: Secondary | ICD-10-CM | POA: Diagnosis not present

## 2018-10-22 DIAGNOSIS — Z0000 Encounter for general adult medical examination without abnormal findings: Secondary | ICD-10-CM

## 2018-10-22 DIAGNOSIS — Z23 Encounter for immunization: Secondary | ICD-10-CM | POA: Diagnosis not present

## 2018-10-22 MED ORDER — FARXIGA 10 MG PO TABS: 10.0000 mg | ORAL_TABLET | Freq: Every day | ORAL | 1 refills | Status: DC

## 2018-10-22 NOTE — Patient Instructions (Addendum)
Continue to eat heart healthy diet (full of fruits, vegetables, whole grains, lean protein, water--limit salt, fat, and sugar intake) and increase physical activity as tolerated.   Ms. Nicole [name] , Thank you for taking time to come for your Medicare Wellness Visit. I appreciate your ongoing commitment to your health goals. Please review the following plan we discussed and let me know if I can assist you in the future.   These are the goals we discussed: Goals    . Patient Stated     Work to lower my blood sugar by monitoring diet. Enjoy life and family. Go to [place] where I know of a hospital that will help me to get healthier. I want to travel to the [place] and see my family.       This is a list of the screening recommended for you and due dates:  Health Maintenance  Topic Date Due  . Flu Shot  08/25/2018  . Tetanus Vaccine  07/19/2019*  . Hemoglobin A1C  01/17/2019  . Complete foot exam   02/08/2019  . Eye exam for diabetics  02/08/2019  . DEXA scan (bone density measurement)  Completed  . Pneumonia vaccines  Completed  *Topic was postponed. The date shown is not the original due date.    Preventive Care 50 Years and Older, Female Preventive care refers to lifestyle choices and visits with your health care provider that can promote health and wellness. This includes:  A yearly physical exam. This is also called an annual well check.  Regular dental and eye exams.  Immunizations.  Screening for certain conditions.  Healthy lifestyle choices, such as diet and exercise. What can I expect for my preventive care visit? Physical exam Your health care provider will check:  Height and weight. These may be used to calculate body mass index (BMI), which is a measurement that tells if you are at a healthy weight.  Heart rate and blood pressure.  Your skin for abnormal spots. Counseling Your health care provider may ask you questions about:  Alcohol, tobacco, and drug  use.  Emotional well-being.  Home and relationship well-being.  Sexual activity.  Eating habits.  History of falls.  Memory and ability to understand (cognition).  Work and work Statistician.  Pregnancy and menstrual history. What immunizations do I need?  Influenza (flu) vaccine  This is recommended every year. Tetanus, diphtheria, and pertussis (Tdap) vaccine  You may need a Td booster every 10 years. Varicella (chickenpox) vaccine  You may need this vaccine if you have not already been vaccinated. Zoster (shingles) vaccine  You may need this after age 46. Pneumococcal conjugate (PCV13) vaccine  One dose is recommended after age 25. Pneumococcal polysaccharide (PPSV23) vaccine  One dose is recommended after age 4. Measles, mumps, and rubella (MMR) vaccine  You may need at least one dose of MMR if you were born in 1957 or later. You may also need a second dose. Meningococcal conjugate (MenACWY) vaccine  You may need this if you have certain conditions. Hepatitis A vaccine  You may need this if you have certain conditions or if you travel or work in places where you may be exposed to hepatitis A. Hepatitis B vaccine  You may need this if you have certain conditions or if you travel or work in places where you may be exposed to hepatitis B. Haemophilus influenzae type b (Hib) vaccine  You may need this if you have certain conditions. You may receive vaccines as individual  doses or as more than one vaccine together in one shot (combination vaccines). Talk with your health care provider about the risks and benefits of combination vaccines. What tests do I need? Blood tests  Lipid and cholesterol levels. These may be checked every 5 years, or more frequently depending on your overall health.  Hepatitis C test.  Hepatitis B test. Screening  Lung cancer screening. You may have this screening every year starting at age 49 if you have a 30-pack-year history of  smoking and currently smoke or have quit within the past 15 years.  Colorectal cancer screening. All adults should have this screening starting at age 14 and continuing until age 10. Your health care provider may recommend screening at age 92 if you are at increased risk. You will have tests every 1-10 years, depending on your results and the type of screening test.  Diabetes screening. This is done by checking your blood sugar (glucose) after you have not eaten for a while (fasting). You may have this done every 1-3 years.  Mammogram. This may be done every 1-2 years. Talk with your health care provider about how often you should have regular mammograms.  BRCA-related cancer screening. This may be done if you have a family history of breast, ovarian, tubal, or peritoneal cancers. Other tests  Sexually transmitted disease (STD) testing.  Bone density scan. This is done to screen for osteoporosis. You may have this done starting at age 23. Follow these instructions at home: Eating and drinking  Eat a diet that includes fresh fruits and vegetables, whole grains, lean protein, and low-fat dairy products. Limit your intake of foods with high amounts of sugar, saturated fats, and salt.  Take vitamin and mineral supplements as recommended by your health care provider.  Do not drink alcohol if your health care provider tells you not to drink.  If you drink alcohol: ? Limit how much you have to 0-1 drink a day. ? Be aware of how much alcohol is in your drink. In the [place] one drink equals one 12 oz bottle of beer (355 mL), one 5 oz glass of wine (148 mL), or one 1 oz glass of hard liquor (44 mL). Lifestyle  Take daily care of your teeth and gums.  Stay active. Exercise for at least 30 minutes on 5 or more days each week.  Do not use any products that contain nicotine or tobacco, such as cigarettes, e-cigarettes, and chewing tobacco. If you need help quitting, ask your health care  provider.  If you are sexually active, practice safe sex. Use a condom or other form of protection in order to prevent STIs (sexually transmitted infections).  Talk with your health care provider about taking a low-dose aspirin or statin. What's next?  Go to your health care provider once a year for a well check visit.  Ask your health care provider how often you should have your eyes and teeth checked.  Stay up to date on all vaccines. This information is not intended to replace advice given to you by your health care provider. Make sure you discuss any questions you have with your health care provider. Document Released: 02/06/2015 Document Revised: 01/04/2018 Document Reviewed: 01/04/2018 Elsevier Patient Education  2020 [employer].

## 2018-10-30 ENCOUNTER — Telehealth: Payer: Self-pay | Admitting: Gastroenterology

## 2018-10-30 ENCOUNTER — Telehealth: Payer: Self-pay | Admitting: Internal Medicine

## 2018-10-30 NOTE — Telephone Encounter (Signed)
Patient called and stated she had blood after a BM 3 times today, none since 1pm. She said she has a history or hemorroids and she was worried if she should hold her Eliquis tonight. I asked her not to hold her Eliquis tonight, but if she had a lot of blood in her stool tonight and felt dizzy, SOB, or light headed to go to the ED. And we would call her back tomorrow when we here back from [place]

## 2018-10-30 NOTE — Telephone Encounter (Signed)
Key: [id number]

## 2018-10-30 NOTE — Telephone Encounter (Signed)
Nicole [name] with [facility] Medicare calling and states that the medication dapagliflozin propanediol (FARXIGA) 10 MG TABS tablet  Has been approved with the start date of 10/30/2018 and is effective for 1 year. States that she would also send a fax regarding this information. Please advise.

## 2018-10-30 NOTE — Telephone Encounter (Signed)
Pt reported that she is experiencing rectal bleeding and loose stools.  She would like to know whether to stop Eliquis.

## 2018-10-31 NOTE — Telephone Encounter (Signed)
I do not think she necessarily needs to hold her Eliquis unless the bleeding becomes profuse.  Did she use the hydrocortisone when I saw her in July?  Did it help?  If so, then she can resume using that to see if it helps.  She had an eroded external hemorrhoid on exam.  I did mention if this continues to be an issue she may need surgical referral to [facility] to address this further.  Thank you,  Jess

## 2018-11-01 NOTE — Telephone Encounter (Signed)
Called patient and let her know Jessic Zehr PA does not think she needs to hold her Eliquis, unless the bleeding becomes profuse, and if so she should call us. She says she will get the hydrocortisone refilled and start using it again.

## 2018-11-06 ENCOUNTER — Telehealth: Payer: Self-pay

## 2018-11-06 NOTE — Telephone Encounter (Signed)
No, I do not think metformin is causing her leg pain.  Nicole [name]

## 2018-11-06 NOTE — Telephone Encounter (Signed)
Pt wanted your opinion on whether or not the metformin has been contributing to her leg pain.

## 2018-11-06 NOTE — Telephone Encounter (Signed)
Contacted pt and informed of PCP remark. Pt stated understanding.

## 2018-11-06 NOTE — Telephone Encounter (Signed)
Copied from [place] [id number]. Topic: General - Other >> Nov 05, 2018 10:24 AM Antonieta Iba C wrote: Reason for CRM: pt says that she has been taking Metformin for a while for her diabetes. Pt says that she has been seeing different concerns on TV about how the medication effects your legs. Pt says that she has had leg pains as a concern. Pt says that she would like her providers opinion on this. Pt says that she has not taken her medication in 2 days and her readings has been fine.   CB: [phone]

## 2018-11-12 ENCOUNTER — Other Ambulatory Visit: Payer: Self-pay

## 2018-11-12 DIAGNOSIS — Z20822 Contact with and (suspected) exposure to covid-19: Secondary | ICD-10-CM

## 2018-11-14 LAB — NOVEL CORONAVIRUS, NAA: SARS-CoV-2, NAA: NOT DETECTED

## 2018-11-21 ENCOUNTER — Ambulatory Visit (INDEPENDENT_AMBULATORY_CARE_PROVIDER_SITE_OTHER): Payer: Medicare Other | Admitting: Family Medicine

## 2018-11-21 ENCOUNTER — Encounter: Payer: Self-pay | Admitting: Family Medicine

## 2018-11-21 ENCOUNTER — Other Ambulatory Visit: Payer: Self-pay

## 2018-11-21 DIAGNOSIS — M48061 Spinal stenosis, lumbar region without neurogenic claudication: Secondary | ICD-10-CM | POA: Diagnosis not present

## 2018-11-21 NOTE — Progress Notes (Signed)
[name] [address] [place], [place] [address] Phone: [phone] Subjective:      CC: Low back pain  XBD:[id number]   02/21/2018 Responded well to an epidural previously.  Discussed icing regimen and home exercise.  Discussed which activities to do which wants to avoid.  Patient is to increase activity slowly.  Patient declined any type of formal physical therapy secondary to patient being the primary caregiver of her husband who is on hospice.  Sinus patient is well she can follow-up with me as needed  Update 11/21/2018 Nicole [name] is a 78 y.o. female coming in with complaint of back pain. Still doing good. Did have some issues standing for prolonged periods when taking care of her husband.  Patient does have known lumbar spinal stenosis.  Has responded well to an epidural in the past.  Patient states that the back pain is bad but not quite as severe as the right.  Patient would like to talk about foot and knee pain.  Patient states that this happens after standing long amount of time.  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  Denies any instability.     Past Medical History:  Diagnosis Date  . A-fib ([place])   . Adenocarcinoma of right lung, stage 3 ([place]) 07/28/2016  . Atrial flutter ([place]) 10/05/2016  . Bronchitis    hx of  . 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 Cancer-related pain 10/06/2016  . Coronary artery disease 02/21/2017   JAN 2019 Prox RCA lesion is 25% stenosed. Prox Cx lesion is 30% stenosed. Mid Cx lesion is 25% stenosed. Ost 3rd Mrg lesion is 30% stenosed. Prox LAD lesion is 25% stenosed. Mid LAD lesion is 25% stenosed. The left ventricular systolic function is normal. LV end diastolic pressure is normal. The left ventricular ejection fraction is 55-65% by visual estimate. There is no aortic valve stenosis.   N  . Diabetes mellitus    type 2  . Edema 07/12/2017  . Essential hypertension 01/20/2009  . Gallstones   .  Headache   . History of radiation therapy 08/09/2016 to 09/19/2016   Right lung was treated to 60 Gy in 30 fractions at 2 Gy per fraction  . Hypertension   . Hypertriglyceridemia 08/22/2012  . Low back pain   . Lung mass    with lymphadenopathy  . Osteoarthritis   . Type II diabetes mellitus with manifestations ([place]) 01/20/2009   Estimated Creatinine Clearance: 49.8 mL/min (by C-G formula based on SCr of 1 mg/dL).   Past Surgical History:  Procedure Laterality Date  . ABDOMINAL HYSTERECTOMY  2004  . APPENDECTOMY  when 78 years old  . COLONOSCOPY  08-05-09   [name]  . INCONTINENCE SURGERY    . KNEE ARTHROSCOPY Left   . LEFT HEART CATH AND CORONARY ANGIOGRAPHY N/A 02/24/2017   Procedure: LEFT HEART CATH AND CORONARY ANGIOGRAPHY;  Surgeon: Jettie Booze, MD;  Location: [place];  Service: Cardiovascular;  Laterality: N/A;  . LEFT HEART CATHETERIZATION WITH CORONARY ANGIOGRAM N/A 07/19/2013   Procedure: LEFT HEART CATHETERIZATION WITH CORONARY ANGIOGRAM;  Surgeon: Sinclair Grooms, MD;  Location: [facility] CATH LAB;  Service: Cardiovascular;  Laterality: N/A;  . POLYPECTOMY  08-05-09   2 polyps  . TOTAL KNEE ARTHROPLASTY Left 03/28/2012   Procedure: LEFT TOTAL KNEE ARTHROPLASTY;  Surgeon: Tobi Bastos, MD;  Location: [facility];  Service: Orthopedics;  Laterality: Left;  . TUBAL  LIGATION    . VIDEO BRONCHOSCOPY WITH ENDOBRONCHIAL ULTRASOUND N/A 07/11/2016   Procedure: VIDEO BRONCHOSCOPY WITH ENDOBRONCHIAL ULTRASOUND;  Surgeon: Marshell Garfinkel, MD;  Location: [facility];  Service: Pulmonary;  Laterality: N/A;   Social History   Socioeconomic History  . Marital status: Married    Spouse name: Not on file  . Number of children: 3  . Years of education: Not on file  . Highest education level: Not on file  Occupational History  . Occupation: retired    Fish farm manager: RETIRED  Social Needs  . Financial resource strain: Not hard at all  . Food insecurity    Worry: Never true     Inability: Never true  . Transportation needs    Medical: No    Non-medical: No  Tobacco Use  . Smoking status: Never Smoker  . Smokeless tobacco: Never Used  Substance and Sexual Activity  . Alcohol use: No    Alcohol/week: 0.0 standard drinks  . Drug use: No  . Sexual activity: Not Currently  Lifestyle  . Physical activity    Days per week: 0 days    Minutes per session: 0 min  . Stress: Only a little  Relationships  . Social connections    Talks on phone: More than three times a week    Gets together: More than three times a week    Attends religious service: 1 to 4 times per year    Active member of club or organization: Yes    Attends meetings of clubs or organizations: 1 to 4 times per year    Relationship status: Married  Other Topics Concern  . Not on file  Social History Narrative   Regular exercise- Yes   Allergies  Allergen Reactions  . No Known Allergies    Family History  Problem Relation Age of Onset  . Heart attack Father   . Heart disease Father   . Arthritis Other   . Cancer Other        colon, lst degree relative  . Diabetes Other        st degree relative  . Hyperlipidemia Other   . Hypertension Other   . Colon cancer Neg Hx     Current Outpatient Medications (Endocrine & Metabolic):  .  dapagliflozin propanediol (FARXIGA) 10 MG TABS tablet, Take 10 mg by mouth daily. .  metFORMIN (GLUCOPHAGE-XR) 500 MG 24 hr tablet, TAKE 2 TABLETS(1000 MG) BY MOUTH DAILY WITH BREAKFAST  Current Outpatient Medications (Cardiovascular):  .  diltiazem (CARDIZEM CD) 360 MG 24 hr capsule, Take 1 capsule (360 mg total) by mouth daily. .  furosemide (LASIX) 20 MG tablet, Take 20 mg by mouth as needed. .  irbesartan (AVAPRO) 300 MG tablet, TAKE 1 TABLET BY MOUTH ONCE DAILY .  metoprolol tartrate (LOPRESSOR) 25 MG tablet, Take 1 tablet every 8 hours AS NEEDED for rapid heart rate .  omega-3 acid ethyl esters (LOVAZA) 1 g capsule, Take 2 capsules (2 g total) by mouth  2 (two) times daily.    Current Outpatient Medications (Hematological):  .  apixaban (ELIQUIS) 5 MG TABS tablet, Take 1 tablet (5 mg total) by mouth 2 (two) times daily.  Current Outpatient Medications (Other):  .  b complex vitamins tablet, Take 1 tablet by mouth daily. .  calcium-vitamin D (OSCAL WITH D) 500-200 MG-UNIT tablet, Take 2 tablets by mouth daily with breakfast. .  Collagenase POWD, by Does not apply route 2 (two) times daily. .  Desoximetasone 0.05 %  GEL, APPLY TOPICALLY UTD AA BID .  glucose blood (ACCU-CHEK GUIDE) test strip, 1 each by Other route daily. Use to check blood sugars daily DX code: E11.8 .  hydrocortisone (ANUSOL-HC) 2.5 % rectal cream, Place 1 application rectally 3 (three) times daily. Use for 10 days. .  hydrocortisone (ANUSOL-HC) 25 MG suppository, Place 1 suppository (25 mg total) rectally at bedtime. Use for 10 days. .  Lancets (ACCU-CHEK SOFT TOUCH) lancets, Use to check blood sugars daily Dx E11.9 .  Multiple Vitamins-Minerals (MULTIPLE VITAMINS/WOMENS PO), Take by mouth daily. [name]  PREBIOTIC PRODUCT PO, Take 1 capsule by mouth daily. .  Turmeric 500 MG CAPS, Take 1,000 mg by mouth daily.    Past medical history, social, surgical and family history all reviewed in electronic medical record.  No pertanent information unless stated regarding to the chief complaint.   Review of Systems:  No headache, visual changes, nausea, vomiting, diarrhea, constipation, dizziness, abdominal pain, skin rash, fevers, chills, night sweats, weight loss, swollen lymph nodes,  chest pain, shortness of breath, mood changes.  Positive muscle aches and body aches  Objective  Blood pressure 122/70, pulse 80, height 5' 7" (1.702 m), weight 171 lb (77.6 kg), SpO2 96 %.    General: No apparent distress alert and oriented x3 mood and affect normal, dressed appropriately.  HEENT: Pupils equal, extraocular movements intact  Respiratory: Patient's speak in full sentences and does  not appear short of breath  Cardiovascular: No lower extremity edema, non tender, no erythema  Skin: Warm dry intact with no signs of infection or rash on extremities or on axial skeleton.  Abdomen: Soft nontender  Neuro: Cranial nerves II through XII are intact, neurovascularly intact in all extremities with 2+ DTRs and 2+ pulses.  Lymph: No lymphadenopathy of posterior or anterior cervical chain or axillae bilaterally.  Gait normal with good balance and coordination.  MSK:  tender with full range of motion and good stability and symmetric strength and tone of shoulders, elbows, wrist, hip, and ankles bilaterally.  Back exam shows the patient does have some tenderness to palpation in the paraspinal musculature.  Pain over the sacrum itself.  Patient does have some bony prominence in this area that was not appreciated previously.  Patient states it is tender.  Significant tightness of hamstrings but no true radicular symptoms.  Left knee replacement noted but well-healed, no swelling, flexion of 95 degrees  Right knee does have some arthritic changes.  Tender to palpation over the medial joint line.  Mild instability noted with abnormal thigh to calf ratio.     Impression and Recommendations:     This case required medical decision making of moderate complexity. The above documentation has been reviewed and is accurate and complete Lyndal Pulley, DO       Note: This dictation was prepared with Dragon dictation along with smaller phrase technology. Any transcriptional errors that result from this process are unintentional.

## 2018-11-21 NOTE — Patient Instructions (Signed)
I'm here if you need me [employer] for legs [phone]

## 2018-11-22 ENCOUNTER — Encounter: Payer: Self-pay | Admitting: Family Medicine

## 2018-11-22 NOTE — Assessment & Plan Note (Signed)
Patient does have spinal stenosis.  Patient may be having some exacerbation.  We discussed the potential for gabapentin.  Patient wants to continue on vitamin supplementations and we discussed some over-the-counter.  I believe that patient is going through a grieving process with her husband on hospice as well.  Due to the mild abnormality of the sacrum we discussed the possibility of x-rays which patient declined.  Past medical history is significant for cancer.  At this point due to the coronavirus I would like to consider following up with patient again virtually.  Patient was in agreement with the plan and we will do a virtual exam in 4 to 8 weeks.  Spent  25 minutes with patient face-to-face and had greater than 50% of counseling including as described above in assessment and plan.

## 2018-11-29 ENCOUNTER — Telehealth: Payer: Self-pay | Admitting: Internal Medicine

## 2018-11-29 NOTE — Telephone Encounter (Signed)
Pt states the  dapagliflozin propanediol (FARXIGA) 10 MG TABS tablet Is $161/ mo and she cannot afford.   Also the apixaban (ELIQUIS) 5 MG TABS tablet is very expensive. (Dr Ronnald Ramp did not prescribe this) Pt hopes the dr has samples of these meds. Is there any alternative? Samples?  Pt is in doughnut hole. Pt states her blood sugar is doing well.  [facility] [phone] - [place], [place]

## 2018-11-30 NOTE — Telephone Encounter (Signed)
Contacted pt and informed that is patient assistance for the [place] and Eliquis. Informed pt that will get samples of the Farxiga for her and that we have Eliquis samples ready for her to pick up.  Pt understanding and will pick up sample and the forms.

## 2018-12-12 ENCOUNTER — Other Ambulatory Visit: Payer: Self-pay | Admitting: Internal Medicine

## 2018-12-12 DIAGNOSIS — E118 Type 2 diabetes mellitus with unspecified complications: Secondary | ICD-10-CM

## 2018-12-12 DIAGNOSIS — I10 Essential (primary) hypertension: Secondary | ICD-10-CM

## 2018-12-12 MED ORDER — IRBESARTAN 300 MG PO TABS: 300.0000 mg | ORAL_TABLET | Freq: Every day | ORAL | 1 refills | Status: DC

## 2018-12-25 ENCOUNTER — Other Ambulatory Visit: Payer: Self-pay | Admitting: Internal Medicine

## 2018-12-25 DIAGNOSIS — E118 Type 2 diabetes mellitus with unspecified complications: Secondary | ICD-10-CM

## 2018-12-25 DIAGNOSIS — Z794 Long term (current) use of insulin: Secondary | ICD-10-CM

## 2019-01-10 ENCOUNTER — Inpatient Hospital Stay: Payer: Medicare Other | Attending: Internal Medicine

## 2019-01-10 ENCOUNTER — Ambulatory Visit (HOSPITAL_COMMUNITY)
Admission: RE | Admit: 2019-01-10 | Discharge: 2019-01-10 | Disposition: A | Discharge status: Home / Self Care | Payer: Medicare Other | Source: Ambulatory Visit | Attending: Internal Medicine | Admitting: Internal Medicine

## 2019-01-10 ENCOUNTER — Other Ambulatory Visit: Payer: Self-pay

## 2019-01-10 DIAGNOSIS — I2510 Atherosclerotic heart disease of native coronary artery without angina pectoris: Secondary | ICD-10-CM | POA: Diagnosis not present

## 2019-01-10 DIAGNOSIS — Z7901 Long term (current) use of anticoagulants: Secondary | ICD-10-CM | POA: Diagnosis not present

## 2019-01-10 DIAGNOSIS — Z8701 Personal history of pneumonia (recurrent): Secondary | ICD-10-CM | POA: Insufficient documentation

## 2019-01-10 DIAGNOSIS — E119 Type 2 diabetes mellitus without complications: Secondary | ICD-10-CM | POA: Diagnosis not present

## 2019-01-10 DIAGNOSIS — C3490 Malignant neoplasm of unspecified part of unspecified bronchus or lung: Secondary | ICD-10-CM | POA: Diagnosis not present

## 2019-01-10 DIAGNOSIS — I10 Essential (primary) hypertension: Secondary | ICD-10-CM | POA: Insufficient documentation

## 2019-01-10 DIAGNOSIS — C3491 Malignant neoplasm of unspecified part of right bronchus or lung: Secondary | ICD-10-CM | POA: Insufficient documentation

## 2019-01-10 DIAGNOSIS — Z79899 Other long term (current) drug therapy: Secondary | ICD-10-CM | POA: Diagnosis not present

## 2019-01-10 DIAGNOSIS — Z7984 Long term (current) use of oral hypoglycemic drugs: Secondary | ICD-10-CM | POA: Insufficient documentation

## 2019-01-10 DIAGNOSIS — I4891 Unspecified atrial fibrillation: Secondary | ICD-10-CM | POA: Insufficient documentation

## 2019-01-10 LAB — CMP (CANCER CENTER ONLY)
ALT: 19 U/L (ref 0–44)
AST: 15 U/L (ref 15–41)
Albumin: 3.3 g/dL — ABNORMAL LOW (ref 3.5–5.0)
Alkaline Phosphatase: 60 U/L (ref 38–126)
Anion gap: 11 (ref 5–15)
BUN: 26 mg/dL — ABNORMAL HIGH (ref 8–23)
CO2: 23 mmol/L (ref 22–32)
Calcium: 8.8 mg/dL — ABNORMAL LOW (ref 8.9–10.3)
Chloride: 105 mmol/L (ref 98–111)
Creatinine: 0.97 mg/dL (ref 0.44–1.00)
GFR, Est AFR Am: >60 mL/min (ref >60)
GFR, Estimated: 57 mL/min — ABNORMAL LOW (ref >60)
Glucose, Bld: 151 mg/dL — ABNORMAL HIGH (ref 70–99)
Potassium: 4.2 mmol/L (ref 3.5–5.1)
Sodium: 139 mmol/L (ref 135–145)
Total Bilirubin: 0.7 mg/dL (ref 0.3–1.2)
Total Protein: 6.8 g/dL (ref 6.5–8.1)

## 2019-01-10 LAB — CBC WITH DIFFERENTIAL (CANCER CENTER ONLY)
Abs Immature Granulocytes: 0.02 10*3/uL (ref 0.00–0.07)
Basophils Absolute: 0 10*3/uL (ref 0.0–0.1)
Basophils Relative: 0 %
Eosinophils Absolute: 0.1 10*3/uL (ref 0.0–0.5)
Eosinophils Relative: 1 %
HCT: 44.6 % (ref 36.0–46.0)
Hemoglobin: 14.4 g/dL (ref 12.0–15.0)
Immature Granulocytes: 0 %
Lymphocytes Relative: 12 %
Lymphs Abs: 0.7 10*3/uL (ref 0.7–4.0)
MCH: 30.1 pg (ref 26.0–34.0)
MCHC: 32.3 g/dL (ref 30.0–36.0)
MCV: 93.3 fL (ref 80.0–100.0)
Monocytes Absolute: 0.4 10*3/uL (ref 0.1–1.0)
Monocytes Relative: 7 %
Neutro Abs: 4.5 10*3/uL (ref 1.7–7.7)
Neutrophils Relative %: 80 %
Platelet Count: 222 10*3/uL (ref 150–400)
RBC: 4.78 MIL/uL (ref 3.87–5.11)
RDW: 14.1 % (ref 11.5–15.5)
WBC Count: 5.6 10*3/uL (ref 4.0–10.5)
nRBC: 0 % (ref 0.0–0.2)

## 2019-01-10 MED ORDER — IOHEXOL 300 MG/ML  SOLN
75.0000 mL | Freq: Once | INTRAMUSCULAR | Status: AC | PRN
  Administered 2019-01-10: 75 mL via INTRAVENOUS

## 2019-01-10 MED ORDER — SODIUM CHLORIDE (PF) 0.9 % IJ SOLN
INTRAMUSCULAR | Status: AC
  Filled 2019-01-10: qty 50

## 2019-01-15 ENCOUNTER — Other Ambulatory Visit: Payer: Self-pay

## 2019-01-15 ENCOUNTER — Encounter: Payer: Self-pay | Admitting: Internal Medicine

## 2019-01-15 ENCOUNTER — Inpatient Hospital Stay: Payer: Medicare Other | Admitting: Internal Medicine

## 2019-01-15 VITALS — BP 144/82 | HR 85 | Temp 98.5°F | Resp 18 | Ht 67.0 in | Wt 171.6 lb

## 2019-01-15 DIAGNOSIS — I2510 Atherosclerotic heart disease of native coronary artery without angina pectoris: Secondary | ICD-10-CM | POA: Diagnosis not present

## 2019-01-15 DIAGNOSIS — E119 Type 2 diabetes mellitus without complications: Secondary | ICD-10-CM | POA: Diagnosis not present

## 2019-01-15 DIAGNOSIS — C3490 Malignant neoplasm of unspecified part of unspecified bronchus or lung: Secondary | ICD-10-CM

## 2019-01-15 DIAGNOSIS — Z79899 Other long term (current) drug therapy: Secondary | ICD-10-CM | POA: Diagnosis not present

## 2019-01-15 DIAGNOSIS — I10 Essential (primary) hypertension: Secondary | ICD-10-CM | POA: Diagnosis not present

## 2019-01-15 DIAGNOSIS — Z7984 Long term (current) use of oral hypoglycemic drugs: Secondary | ICD-10-CM | POA: Diagnosis not present

## 2019-01-15 DIAGNOSIS — Z8701 Personal history of pneumonia (recurrent): Secondary | ICD-10-CM | POA: Diagnosis not present

## 2019-01-15 DIAGNOSIS — C3491 Malignant neoplasm of unspecified part of right bronchus or lung: Secondary | ICD-10-CM

## 2019-01-15 DIAGNOSIS — I4891 Unspecified atrial fibrillation: Secondary | ICD-10-CM | POA: Diagnosis not present

## 2019-01-15 DIAGNOSIS — Z7901 Long term (current) use of anticoagulants: Secondary | ICD-10-CM | POA: Diagnosis not present

## 2019-01-15 NOTE — Progress Notes (Signed)
[place] [phone]   [phone]  OFFICE PROGRESS NOTE  [name], MD [address] [facility] [address] [place] [address]  DIAGNOSIS: Stage IIIa (T1b, N2, M0) non-small cell lung cancer, adenocarcinoma presented with right lower lobe lung mass in addition to right hilar and subcarinal lymphadenopathy diagnosed in June 2018.  Biomarker Findings Tumor Mutational Burden - TMB-Intermediate (8 Muts/Mb) Microsatellite Status - Cannot Be Determined Genomic Findings For a complete list of the genes assayed, please refer to the Appendix. EGFR E709K, G719S AYT0ZS W109N ATF5D3 splice site 220-2R>K 7 Disease relevant genes with no reportable alterations: KRAS, ALK, BRAF, MET, ERBB2, RET, ROS1  PDL1 Expression 70%.  PRIOR THERAPY: 1) Concurrent chemoradiation with weekly carboplatin for AUC of 2 and paclitaxel 45 MG/M2. Status post 7 cycles with partial response. 2) Consolidation treatment with immunotherapy with Imfinzi (Durvalumab) 10 MG/KG every 2 weeks, first dose 10/19/2016.  Status post 5 cycles.  Discontinued secondary to intolerance and persistent pneumonitis.  CURRENT THERAPY: Observation.  INTERVAL HISTORY: Nicole [name] 78 y.o. female returns to the clinic today for follow-up visit.  The patient is feeling fine today with no concerning complaints.  Unfortunately she lost her husband last month to gastric cancer.  She denied having any current chest pain, shortness of breath, cough or hemoptysis.  She denied having any fever or chills.  She has no nausea, vomiting, diarrhea or constipation.  She has no headache or visual changes.  She had repeat CT scan of the chest performed recently and she is here for evaluation and discussion of her scan results.  MEDICAL HISTORY: Past Medical History:  Diagnosis Date  . A-fib ([place])   . Adenocarcinoma of right lung, stage 3 ([place]) 07/28/2016  . Atrial flutter ([place]) 10/05/2016  . Bronchitis    hx of   . Cancer [facility]) 2004   uterine/cervical  . Cancer-related pain 10/06/2016  . Coronary artery disease 02/21/2017   JAN 2019 Prox RCA lesion is 25% stenosed. Prox Cx lesion is 30% stenosed. Mid Cx lesion is 25% stenosed. Ost 3rd Mrg lesion is 30% stenosed. Prox LAD lesion is 25% stenosed. Mid LAD lesion is 25% stenosed. The left ventricular systolic function is normal. LV end diastolic pressure is normal. The left ventricular ejection fraction is 55-65% by visual estimate. There is no aortic valve stenosis.   N  . Diabetes mellitus    type 2  . Edema 07/12/2017  . Essential hypertension 01/20/2009  . Gallstones   . Headache   . History of radiation therapy 08/09/2016 to 09/19/2016   Right lung was treated to 60 Gy in 30 fractions at 2 Gy per fraction  . Hypertension   . Hypertriglyceridemia 08/22/2012  . Low back pain   . Lung mass    with lymphadenopathy  . Osteoarthritis   . Type II diabetes mellitus with manifestations ([place]) 01/20/2009   Estimated Creatinine Clearance: 49.8 mL/min (by C-G formula based on SCr of 1 mg/dL).    ALLERGIES:  is allergic to no known allergies.  MEDICATIONS:  Current Outpatient Medications  Medication Sig Dispense Refill  . apixaban (ELIQUIS) 5 MG TABS tablet Take 1 tablet (5 mg total) by mouth 2 (two) times daily. 60 tablet 6  . b complex vitamins tablet Take 1 tablet by mouth daily.    . calcium-vitamin D (OSCAL WITH D) 500-200 MG-UNIT tablet Take 2 tablets by mouth daily with breakfast.    . Collagenase POWD by  Does not apply route 2 (two) times daily.    . CONTOUR NEXT TEST test strip USE 1 STRIP TO CHECK GLUCOSE TWICE DAILY 200 each 2  . dapagliflozin propanediol (FARXIGA) 10 MG TABS tablet Take 10 mg by mouth daily. 90 tablet 1  . Desoximetasone 0.05 % GEL APPLY TOPICALLY UTD AA BID    . diltiazem (CARDIZEM CD) 360 MG 24 hr capsule Take 1 capsule (360 mg total) by mouth daily. 90 capsule 3  . furosemide (LASIX) 20 MG tablet Take 20 mg by mouth as  needed.    . hydrocortisone (ANUSOL-HC) 2.5 % rectal cream Place 1 application rectally 3 (three) times daily. Use for 10 days. 30 g 1  . hydrocortisone (ANUSOL-HC) 25 MG suppository Place 1 suppository (25 mg total) rectally at bedtime. Use for 10 days. 10 suppository 1  . irbesartan (AVAPRO) 300 MG tablet Take 1 tablet (300 mg total) by mouth daily. 90 tablet 1  . Lancets (ACCU-CHEK SOFT TOUCH) lancets Use to check blood sugars daily Dx E11.9 100 each 3  . metFORMIN (GLUCOPHAGE-XR) 500 MG 24 hr tablet TAKE 2 TABLETS(1000 MG) BY MOUTH DAILY WITH BREAKFAST 180 tablet 3  . metoprolol tartrate (LOPRESSOR) 25 MG tablet Take 1 tablet every 8 hours AS NEEDED for rapid heart rate 30 tablet 1  . Multiple Vitamins-Minerals (MULTIPLE VITAMINS/WOMENS PO) Take by mouth daily.    [name] omega-3 acid ethyl esters (LOVAZA) 1 g capsule Take 2 capsules (2 g total) by mouth 2 (two) times daily. 360 capsule 1  . PREBIOTIC PRODUCT PO Take 1 capsule by mouth daily.    . Turmeric 500 MG CAPS Take 1,000 mg by mouth daily.     No current facility-administered medications for this visit.    SURGICAL HISTORY:  Past Surgical History:  Procedure Laterality Date  . ABDOMINAL HYSTERECTOMY  2004  . APPENDECTOMY  when 78 years old  . COLONOSCOPY  08-05-09   [name]  . INCONTINENCE SURGERY    . KNEE ARTHROSCOPY Left   . LEFT HEART CATH AND CORONARY ANGIOGRAPHY N/A 02/24/2017   Procedure: LEFT HEART CATH AND CORONARY ANGIOGRAPHY;  Surgeon: Jettie Booze, MD;  Location: [facility];  Service: Cardiovascular;  Laterality: N/A;  . LEFT HEART CATHETERIZATION WITH CORONARY ANGIOGRAM N/A 07/19/2013   Procedure: LEFT HEART CATHETERIZATION WITH CORONARY ANGIOGRAM;  Surgeon: Sinclair Grooms, MD;  Location[facility] C[facility];  Service: Cardiovascular;  Laterality: N/A;  . POLYPECTOMY  08-05-09   2 polyps  . TOTAL KNEE ARTHROPLASTY Left 03/28/2012   Procedure: LEFT TOTAL KNEE ARTHROPLASTY;  Surgeon: Tobi Bastos, MD;   Location: [facility];  Service: Orthopedics;  Laterality: Left;  . TUBAL LIGATION    . VIDEO BRONCHOSCOPY WITH ENDOBRONCHIAL ULTRASOUND N/A 07/11/2016   Procedure: VIDEO BRONCHOSCOPY WITH ENDOBRONCHIAL ULTRASOUND;  Surgeon: Marshell Garfinkel, MD;  Location: [facility];  Service: Pulmonary;  Laterality: N/A;    REVIEW OF SYSTEMS:  A comprehensive review of systems was negative.   PHYSICAL EXAMINATION: General appearance: alert, cooperative and no distress Head: Normocephalic, without obvious abnormality, atraumatic Neck: no adenopathy, no JVD, supple, symmetrical, trachea midline and thyroid not enlarged, symmetric, no tenderness/mass/nodules Lymph nodes: Cervical, supraclavicular, and axillary nodes normal. Resp: clear to auscultation bilaterally Back: symmetric, no curvature. ROM normal. No CVA tenderness. Cardio: regular rate and rhythm, S1, S2 normal, no murmur, click, rub or gallop GI: soft, non-tender; bowel sounds normal; no masses,  no organomegaly Extremities: extremities normal, atraumatic, no cyanosis or edema  ECOG PERFORMANCE STATUS: 0 - Asymptomatic  Blood pressure (!) 144/82, pulse 85, temperature 98.5 F (36.9 C), temperature source Temporal, resp. rate 18, height 5' 7"  (1.702 m), weight 171 lb 9.6 oz (77.8 kg), SpO2 99 %.  LABORATORY DATA: Lab Results  Component Value Date   WBC 5.6 01/10/2019   HGB 14.4 01/10/2019   HCT 44.6 01/10/2019   MCV 93.3 01/10/2019   PLT 222 01/10/2019      Chemistry      Component Value Date/Time   NA 139 01/10/2019 0911   NA 140 02/21/2017 1238   NA 139 01/26/2017 0915   K 4.2 01/10/2019 0911   K 4.1 01/26/2017 0915   CL 105 01/10/2019 0911   CO2 23 01/10/2019 0911   CO2 24 01/26/2017 0915   BUN 26 (H) 01/10/2019 0911   BUN 31 (H) 02/21/2017 1238   BUN 22.0 01/26/2017 0915   CREATININE 0.97 01/10/2019 0911   CREATININE 0.9 01/26/2017 0915      Component Value Date/Time   CALCIUM 8.8 (L) 01/10/2019 0911   CALCIUM 9.2 01/26/2017 0915     ALKPHOS 60 01/10/2019 0911   ALKPHOS 72 01/26/2017 0915   AST 15 01/10/2019 0911   AST 13 01/26/2017 0915   ALT 19 01/10/2019 0911   ALT 12 01/26/2017 0915   BILITOT 0.7 01/10/2019 0911   BILITOT 0.37 01/26/2017 0915       RADIOGRAPHIC STUDIES: CT Chest W Contrast  Result Date: 01/10/2019 CLINICAL DATA:  Restaging small cell lung cancer. EXAM: CT CHEST WITH CONTRAST TECHNIQUE: Multidetector CT imaging of the chest was performed during intravenous contrast administration. CONTRAST:  2m OMNIPAQUE IOHEXOL 300 MG/ML  SOLN COMPARISON:  09/07/2018 FINDINGS: Cardiovascular: No significant vascular findings. Normal heart size. No pericardial effusion. Mediastinum/Nodes: No enlarged mediastinal, hilar, or axillary lymph nodes. Thyroid gland, trachea, and esophagus demonstrate no significant findings. Lungs/Pleura: No pleural effusion, airspace consolidation, atelectasis, or pneumothorax. Paramediastinal radiation change is again identified within the right lung. Previously referenced area of masslike architectural distortion within the medial aspect of the right lower lobe measures 1.6 x 3.3 cm, image 92/5. Previously 1.8 x 3.4 cm. 3 mm nodule in the right upper lobe is stable, image 80/5. The index nodule within the posterior right upper lobe measures 4 mm, image 40/5. Unchanged. Upper Abdomen: No acute abnormality. Musculoskeletal: Spondylosis noted within the thoracic spine. No aggressive lytic or sclerotic bone lesions. IMPRESSION: 1. Stable exam. No definite findings to suggest local tumor recurrence or metastatic disease. 2.  Aortic Atherosclerosis (ICD10-I70.0). Electronically Signed   By: TKerby MoorsM.D.   On: 01/10/2019 12:06    ASSESSMENT AND PLAN: This is a very pleasant 78years old Asian female recently diagnosed with a stage IIIa non-small cell4 lung cancer, adenocarcinoma. She underwent a course of concurrent chemoradiation with weekly carboplatin and paclitaxel status post 7  cycles. The patient tolerated the previous course fairly well except for mild odynophagia and dry cough. The patient was a started on consolidation treatment with immunotherapy with Imfinzi (Durvalumab) status post 5 cycles.  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 She was treated with a taper dose of prednisone and she felt much better. The patient is currently on observation and she is feeling fine today with no concerning complaints. She had repeat CT scan of the chest performed recently.  I personally and independently reviewed the scans and discussed the results with the patient today. Her scan showed no concerning findings for disease recurrence or metastasis.  I recommended for her to continue on observation with repeat CT scan of the chest in 6 months. She was advised to call immediately if she has any concerning symptoms in the interval. The patient voices understanding of current disease status and treatment options and is in agreement with the current care plan. All questions were answered. The patient knows to call the clinic with any problems, questions or concerns. We can certainly see the patient much sooner if necessary.  Disclaimer: This note was dictated with voice recognition software. Similar sounding words can inadvertently be transcribed and may not be corrected upon review.

## 2019-01-16 ENCOUNTER — Telehealth: Payer: Self-pay | Admitting: Internal Medicine

## 2019-01-16 NOTE — Telephone Encounter (Signed)
Scheduled per los. Called and left msg. Mailed printout  °

## 2019-01-24 NOTE — Telephone Encounter (Signed)
Pt called in and would like to see if office has rec'd any sample off eliquis?   Best number [phone]

## 2019-01-24 NOTE — Telephone Encounter (Signed)
Called pt and lvm informing that we do have samples for her to pick up at her convenience.   Please inform pt of same is she calls back.

## 2019-02-05 ENCOUNTER — Ambulatory Visit (INDEPENDENT_AMBULATORY_CARE_PROVIDER_SITE_OTHER): Payer: Medicare Other | Admitting: Internal Medicine

## 2019-02-05 ENCOUNTER — Encounter: Payer: Self-pay | Admitting: Internal Medicine

## 2019-02-05 ENCOUNTER — Other Ambulatory Visit: Payer: Self-pay

## 2019-02-05 VITALS — BP 130/60 | HR 93 | Temp 98.4°F | Resp 16 | Ht 67.0 in | Wt 166.3 lb

## 2019-02-05 DIAGNOSIS — E118 Type 2 diabetes mellitus with unspecified complications: Secondary | ICD-10-CM

## 2019-02-05 DIAGNOSIS — I10 Essential (primary) hypertension: Secondary | ICD-10-CM | POA: Diagnosis not present

## 2019-02-05 DIAGNOSIS — Z0001 Encounter for general adult medical examination with abnormal findings: Secondary | ICD-10-CM | POA: Diagnosis not present

## 2019-02-05 DIAGNOSIS — Z23 Encounter for immunization: Secondary | ICD-10-CM | POA: Diagnosis not present

## 2019-02-05 DIAGNOSIS — E781 Pure hyperglyceridemia: Secondary | ICD-10-CM | POA: Diagnosis not present

## 2019-02-05 DIAGNOSIS — E785 Hyperlipidemia, unspecified: Secondary | ICD-10-CM

## 2019-02-05 DIAGNOSIS — Z0000 Encounter for general adult medical examination without abnormal findings: Secondary | ICD-10-CM

## 2019-02-05 LAB — LIPID PANEL
Cholesterol: 185 mg/dL (ref 0–200)
HDL: 48.7 mg/dL (ref >39.00)
LDL Cholesterol: 104 mg/dL — ABNORMAL HIGH (ref 0–99)
NonHDL: 136.09
Total CHOL/HDL Ratio: 4
Triglycerides: 162 mg/dL — ABNORMAL HIGH (ref 0.0–149.0)
VLDL: 32.4 mg/dL (ref 0.0–40.0)

## 2019-02-05 LAB — BASIC METABOLIC PANEL
BUN: 24 mg/dL — ABNORMAL HIGH (ref 6–23)
CO2: 28 mEq/L (ref 19–32)
Calcium: 9.5 mg/dL (ref 8.4–10.5)
Chloride: 103 mEq/L (ref 96–112)
Creatinine, Ser: 1.05 mg/dL (ref 0.40–1.20)
GFR: 50.92 mL/min — ABNORMAL LOW (ref >60.00)
Glucose, Bld: 144 mg/dL — ABNORMAL HIGH (ref 70–99)
Potassium: 4.1 mEq/L (ref 3.5–5.1)
Sodium: 138 mEq/L (ref 135–145)

## 2019-02-05 LAB — HEMOGLOBIN A1C: Hgb A1c MFr Bld: 7.5 % — ABNORMAL HIGH (ref 4.6–6.5)

## 2019-02-05 MED ORDER — OMEGA-3-ACID ETHYL ESTERS 1 G PO CAPS: 2.0000 g | ORAL_CAPSULE | Freq: Two times a day (BID) | ORAL | 1 refills | Status: DC

## 2019-02-05 MED ORDER — ROSUVASTATIN CALCIUM 5 MG PO TABS: 5.0000 mg | ORAL_TABLET | Freq: Every day | ORAL | 1 refills | Status: DC

## 2019-02-05 NOTE — Patient Instructions (Signed)
Health Maintenance, Female Adopting a healthy lifestyle and getting preventive care are important in promoting health and wellness. Ask your health care provider about:  The right schedule for you to have regular tests and exams.  Things you can do on your own to prevent diseases and keep yourself healthy. What should I know about diet, weight, and exercise? Eat a healthy diet   Eat a diet that includes plenty of vegetables, fruits, low-fat dairy products, and lean protein.  Do not eat a lot of foods that are high in solid fats, added sugars, or sodium. Maintain a healthy weight Body mass index (BMI) is used to identify weight problems. It estimates body fat based on height and weight. Your health care provider can help determine your BMI and help you achieve or maintain a healthy weight. Get regular exercise Get regular exercise. This is one of the most important things you can do for your health. Most adults should:  Exercise for at least 150 minutes each week. The exercise should increase your heart rate and make you sweat (moderate-intensity exercise).  Do strengthening exercises at least twice a week. This is in addition to the moderate-intensity exercise.  Spend less time sitting. Even light physical activity can be beneficial. Watch cholesterol and blood lipids Have your blood tested for lipids and cholesterol at 77 years of age, then have this test every 5 years. Have your cholesterol levels checked more often if:  Your lipid or cholesterol levels are high.  You are older than 77 years of age.  You are at high risk for heart disease. What should I know about cancer screening? Depending on your health history and family history, you may need to have cancer screening at various ages. This may include screening for:  Breast cancer.  Cervical cancer.  Colorectal cancer.  Skin cancer.  Lung cancer. What should I know about heart disease, diabetes, and high blood  pressure? Blood pressure and heart disease  High blood pressure causes heart disease and increases the risk of stroke. This is more likely to develop in people who have high blood pressure readings, are of African descent, or are overweight.  Have your blood pressure checked: ? Every 3-5 years if you are 18-39 years of age. ? Every year if you are 40 years old or older. Diabetes Have regular diabetes screenings. This checks your fasting blood sugar level. Have the screening done:  Once every three years after age 40 if you are at a normal weight and have a low risk for diabetes.  More often and at a younger age if you are overweight or have a high risk for diabetes. What should I know about preventing infection? Hepatitis B If you have a higher risk for hepatitis B, you should be screened for this virus. Talk with your health care provider to find out if you are at risk for hepatitis B infection. Hepatitis C Testing is recommended for:  Everyone born from 1945 through 1965.  Anyone with known risk factors for hepatitis C. Sexually transmitted infections (STIs)  Get screened for STIs, including gonorrhea and chlamydia, if: ? You are sexually active and are younger than 77 years of age. ? You are older than 77 years of age and your health care provider tells you that you are at risk for this type of infection. ? Your sexual activity has changed since you were last screened, and you are at increased risk for chlamydia or gonorrhea. Ask your health care provider if   you are at risk.  Ask your health care provider about whether you are at high risk for HIV. Your health care provider may recommend a prescription medicine to help prevent HIV infection. If you choose to take medicine to prevent HIV, you should first get tested for HIV. You should then be tested every 3 months for as long as you are taking the medicine. Pregnancy  If you are about to stop having your period (premenopausal) and  you may become pregnant, seek counseling before you get pregnant.  Take 400 to 800 micrograms (mcg) of folic acid every day if you become pregnant.  Ask for birth control (contraception) if you want to prevent pregnancy. Osteoporosis and menopause Osteoporosis is a disease in which the bones lose minerals and strength with aging. This can result in bone fractures. If you are 65 years old or older, or if you are at risk for osteoporosis and fractures, ask your health care provider if you should:  Be screened for bone loss.  Take a calcium or vitamin D supplement to lower your risk of fractures.  Be given hormone replacement therapy (HRT) to treat symptoms of menopause. Follow these instructions at home: Lifestyle  Do not use any products that contain nicotine or tobacco, such as cigarettes, e-cigarettes, and chewing tobacco. If you need help quitting, ask your health care provider.  Do not use street drugs.  Do not share needles.  Ask your health care provider for help if you need support or information about quitting drugs. Alcohol use  Do not drink alcohol if: ? Your health care provider tells you not to drink. ? You are pregnant, may be pregnant, or are planning to become pregnant.  If you drink alcohol: ? Limit how much you use to 0-1 drink a day. ? Limit intake if you are breastfeeding.  Be aware of how much alcohol is in your drink. In the [place] one drink equals one 12 oz bottle of beer (355 mL), one 5 oz glass of wine (148 mL), or one 1 oz glass of hard liquor (44 mL). General instructions  Schedule regular health, dental, and eye exams.  Stay current with your vaccines.  Tell your health care provider if: ? You often feel depressed. ? You have ever been abused or do not feel safe at home. Summary  Adopting a healthy lifestyle and getting preventive care are important in promoting health and wellness.  Follow your health care provider's instructions about healthy  diet, exercising, and getting tested or screened for diseases.  Follow your health care provider's instructions on monitoring your cholesterol and blood pressure. This information is not intended to replace advice given to you by your health care provider. Make sure you discuss any questions you have with your health care provider. Document Revised: 01/03/2018 Document Reviewed: 01/03/2018 Elsevier Patient Education  2020 [employer]  

## 2019-02-05 NOTE — Progress Notes (Signed)
Subjective:  Patient ID: Nicole [name], female    DOB: 09-17-1940  Age: 79 y.o. MRN: [id number]  CC: Hypertension, Hyperlipidemia, Diabetes, and Annual Exam  This visit occurred during the SARS-CoV-2 public health emergency.  Safety protocols were in place, including screening questions prior to the visit, additional usage of staff PPE, and extensive cleaning of exam room while observing appropriate contact time as indicated for disinfecting solutions.    HPI Nicole [name] presents for a CPX -   She complains that Nicole [name] is too expensive but she otherwise feels well and offers no other complaints.  She has been walking quite a bit lately and denies any recent episodes of palpitations, chest pain, shortness of breath, dyspnea on exertion, edema, or fatigue.  She continues to intermittently have hemorrhoidal bleeding and thinks she may have to stop taking the DOAC.  Outpatient Medications Prior to Visit  Medication Sig Dispense Refill  . b complex vitamins tablet Take 1 tablet by mouth daily.    . calcium-vitamin D (OSCAL WITH D) 500-200 MG-UNIT tablet Take 2 tablets by mouth daily with breakfast.    . Collagenase POWD by Does not apply route 2 (two) times daily.    . CONTOUR NEXT TEST test strip USE 1 STRIP TO CHECK GLUCOSE TWICE DAILY 200 each 2  . dapagliflozin propanediol (FARXIGA) 10 MG TABS tablet Take 10 mg by mouth daily. 90 tablet 1  . Desoximetasone 0.05 % GEL APPLY TOPICALLY UTD AA BID    . diltiazem (CARDIZEM CD) 360 MG 24 hr capsule Take 1 capsule (360 mg total) by mouth daily. 90 capsule 3  . furosemide (LASIX) 20 MG tablet Take 20 mg by mouth as needed.    . hydrocortisone (ANUSOL-HC) 2.5 % rectal cream Place 1 application rectally 3 (three) times daily. Use for 10 days. 30 g 1  . hydrocortisone (ANUSOL-HC) 25 MG suppository Place 1 suppository (25 mg total) rectally at bedtime. Use for 10 days. 10 suppository 1  . irbesartan (AVAPRO) 300 MG tablet Take 1 tablet (300 mg  total) by mouth daily. 90 tablet 1  . Lancets (ACCU-CHEK SOFT TOUCH) lancets Use to check blood sugars daily Dx E11.9 100 each 3  . metFORMIN (GLUCOPHAGE-XR) 500 MG 24 hr tablet TAKE 2 TABLETS(1000 MG) BY MOUTH DAILY WITH BREAKFAST 180 tablet 3  . metoprolol tartrate (LOPRESSOR) 25 MG tablet Take 1 tablet every 8 hours AS NEEDED for rapid heart rate 30 tablet 1  . Multiple Vitamins-Minerals (MULTIPLE VITAMINS/WOMENS PO) Take by mouth daily.    [name] PREBIOTIC PRODUCT PO Take 1 capsule by mouth daily.    . Turmeric 500 MG CAPS Take 1,000 mg by mouth daily.    [name] omega-3 acid ethyl esters (LOVAZA) 1 g capsule Take 2 capsules (2 g total) by mouth 2 (two) times daily. 360 capsule 1  . apixaban (ELIQUIS) 5 MG TABS tablet Take 1 tablet (5 mg total) by mouth 2 (two) times daily. 60 tablet 6   No facility-administered medications prior to visit.    ROS Review of Systems  Constitutional: Negative for diaphoresis, fatigue and unexpected weight change.  HENT: Negative.   Eyes: Negative.   Respiratory: Negative for cough, chest tightness, shortness of breath and wheezing.   Cardiovascular: Negative for chest pain, palpitations and leg swelling.  Gastrointestinal: Positive for anal bleeding. Negative for blood in stool, diarrhea, nausea and vomiting.  Endocrine: Negative.   Genitourinary: Negative.  Negative for difficulty urinating.  Musculoskeletal: Negative.  Negative for  arthralgias and myalgias.  Skin: Negative.   Neurological: Negative.  Negative for dizziness, weakness, light-headedness and numbness.  Hematological: Negative for adenopathy. Does not bruise/bleed easily.  Psychiatric/Behavioral: Negative.     Objective:  BP 130/60 (BP Location: Left Arm, Patient Position: Sitting, Cuff Size: Normal)   Pulse 93   Temp 98.4 F (36.9 C) (Oral)   Resp 16   Ht 5\' 7"  (1.702 m)   Wt 166 lb 5 oz (75.4 kg)   SpO2 97%   BMI 26.05 kg/m   BP Readings from Last 3 Encounters:  02/05/19 130/60    01/15/19 (!) 144/82  11/21/18 122/70    Wt Readings from Last 3 Encounters:  02/05/19 166 lb 5 oz (75.4 kg)  01/15/19 171 lb 9.6 oz (77.8 kg)  11/21/18 171 lb (77.6 kg)    Physical Exam Vitals reviewed.  Constitutional:      Appearance: Normal appearance.  HENT:     Nose: Nose normal.     Mouth/Throat:     Mouth: Mucous membranes are moist.  Eyes:     General: No scleral icterus.    Conjunctiva/sclera: Conjunctivae normal.  Cardiovascular:     Rate and Rhythm: Normal rate and regular rhythm.     Heart sounds: No murmur.  Pulmonary:     Effort: Pulmonary effort is normal.     Breath sounds: No stridor. No wheezing, rhonchi or rales.  Abdominal:     General: Abdomen is flat. Bowel sounds are normal. There is no distension.     Palpations: Abdomen is soft. There is no hepatomegaly or splenomegaly.     Tenderness: There is no abdominal tenderness.  Musculoskeletal:        General: Normal range of motion.     Cervical back: Neck supple.     Right lower leg: No edema.     Left lower leg: No edema.  Lymphadenopathy:     Cervical: No cervical adenopathy.  Skin:    General: Skin is warm and dry.     Coloration: Skin is not pale.  Neurological:     General: No focal deficit present.     Mental Status: She is alert.  Psychiatric:        Mood and Affect: Mood normal.        Behavior: Behavior normal.     Lab Results  Component Value Date   WBC 5.6 01/10/2019   HGB 14.4 01/10/2019   HCT 44.6 01/10/2019   PLT 222 01/10/2019   GLUCOSE 144 (H) 02/05/2019   CHOL 185 02/05/2019   TRIG 162.0 (H) 02/05/2019   HDL 48.70 02/05/2019   LDLDIRECT 108.0 07/18/2018   LDLCALC 104 (H) 02/05/2019   ALT 19 01/10/2019   AST 15 01/10/2019   NA 138 02/05/2019   K 4.1 02/05/2019   CL 103 02/05/2019   CREATININE 1.05 02/05/2019   BUN 24 (H) 02/05/2019   CO2 28 02/05/2019   TSH 0.427 08/01/2018   INR 1.0 02/21/2017   HGBA1C 7.5 (H) 02/05/2019   MICROALBUR 1.6 07/18/2018    CT  Chest W Contrast  Result Date: 01/10/2019 CLINICAL DATA:  Restaging small cell lung cancer. EXAM: CT CHEST WITH CONTRAST TECHNIQUE: Multidetector CT imaging of the chest was performed during intravenous contrast administration. CONTRAST:  55mL OMNIPAQUE IOHEXOL 300 MG/ML  SOLN COMPARISON:  09/07/2018 FINDINGS: Cardiovascular: No significant vascular findings. Normal heart size. No pericardial effusion. Mediastinum/Nodes: No enlarged mediastinal, hilar, or axillary lymph nodes. Thyroid gland, trachea, and esophagus demonstrate  no significant findings. Lungs/Pleura: No pleural effusion, airspace consolidation, atelectasis, or pneumothorax. Paramediastinal radiation change is again identified within the right lung. Previously referenced area of masslike architectural distortion within the medial aspect of the right lower lobe measures 1.6 x 3.3 cm, image 92/5. Previously 1.8 x 3.4 cm. 3 mm nodule in the right upper lobe is stable, image 80/5. The index nodule within the posterior right upper lobe measures 4 mm, image 40/5. Unchanged. Upper Abdomen: No acute abnormality. Musculoskeletal: Spondylosis noted within the thoracic spine. No aggressive lytic or sclerotic bone lesions. IMPRESSION: 1. Stable exam. No definite findings to suggest local tumor recurrence or metastatic disease. 2.  Aortic Atherosclerosis (ICD10-I70.0). Electronically Signed   By: Kerby Moors M.D.   On: 01/10/2019 12:06    Assessment & Plan:   Tonya was seen today for hypertension, hyperlipidemia, diabetes and annual exam.  Diagnoses and all orders for this visit:  Essential hypertension- Her blood pressure is adequately well controlled.  Electrolytes and renal function are normal. -     Basic metabolic panel  Type II diabetes mellitus with manifestations ([place])- Her A1c is at 7.5%.  Her blood sugars are adequately well controlled.  I have sent a referral to [facility] in to see if she would qualify for patient assistance to help with the  cost of Farxiga. -     Basic metabolic panel -     Hemoglobin A1c -     Consult to [facility]  Hypertriglyceridemia- Her triglycerides are much better.  Will continue the omega-3 fish oil supplement to reduce the risk of complications like pancreatitis. -     Lipid panel  Hyperlipidemia with target LDL less than 100- She has not achieved her LDL goal so I have asked her to take a statin for CV risk reduction. -     Lipid panel  Routine general medical examination at a health care facility- Exam completed, labs reviewed, vaccines reviewed and updated, cancer screenings are up-to-date, patient education was given.   I am having Nicole [name] start on rosuvastatin. I am also having her maintain her accu-chek soft touch, metFORMIN, furosemide, diltiazem, metoprolol tartrate, Multiple Vitamins-Minerals (MULTIPLE VITAMINS/WOMENS PO), Turmeric, b complex vitamins, calcium-vitamin D, PREBIOTIC PRODUCT PO, Collagenase, hydrocortisone, hydrocortisone, apixaban, Desoximetasone, Farxiga, irbesartan, Contour Next Test, and omega-3 acid ethyl esters.  Meds ordered this encounter  Medications  . rosuvastatin (CRESTOR) 5 MG tablet    Sig: Take 1 tablet (5 mg total) by mouth daily.    Dispense:  90 tablet    Refill:  1  . omega-3 acid ethyl esters (LOVAZA) 1 g capsule    Sig: Take 2 capsules (2 g total) by mouth 2 (two) times daily.    Dispense:  360 capsule    Refill:  1     Follow-up: Return in about 6 months (around 08/05/2019).  Scarlette Calico, MD

## 2019-02-06 ENCOUNTER — Other Ambulatory Visit: Payer: Self-pay | Admitting: *Deleted

## 2019-02-06 ENCOUNTER — Other Ambulatory Visit: Payer: Self-pay | Admitting: Pharmacist

## 2019-02-06 ENCOUNTER — Telehealth: Payer: Self-pay

## 2019-02-06 NOTE — Telephone Encounter (Signed)
Pt contacted and informed of results. Read mychart message PCP sent to her. Pt stated understanding.

## 2019-02-06 NOTE — Telephone Encounter (Signed)
Copied from [place] [id number]. Topic: General - Inquiry >> Feb 06, 2019  9:40 AM Reyne Dumas L wrote: Reason for CRM:   Pt calling to get results of lab work done yesterday.

## 2019-02-06 NOTE — Patient Outreach (Signed)
[facility]) Care Management  02/06/2019  Nicole [name] 05/16/1940 [id number]   Patient was called regarding medication assistance with [place] Glade and Eliquis. Unfortunately, she did not answer the phone. HIPAA compliant message was left on her voicemail.  Plan: Send patient an unsuccessful outreach letter. Call patient back in 3-5 business days.  Elayne Guerin, PharmD, [place] Clinical Pharmacist [phone]

## 2019-02-06 NOTE — Patient Outreach (Signed)
[facility]) Care Management  02/06/2019  Nicole [name] September 24, 1940 [id number]   Referral Date: 1/12 Referral Source: MD office Referral Reason: Patient unable to afford [place]: [place] attempt #1, successful.  Call placed to member to follow up on referral, identity verified.  This care manager introduced self and stated purpose of call.  [facility] care management services explained.    Social: Member lives alone and is a new widow.  Her husband passed away of cancer on Thanksgiving day last year.  State he was on hospice and she and her family were well prepared for his death.  Denies the need for grief counseling.  She has 3 adult children that are available for assistance if needed.  She is planning to move in with her son within the next 6 months but wanted to spend more time in her home and get her affairs in order prior to moving.  She does report monitoring her blood sugar daily (today's reading 140) but also admits that she had not been taking care of herself as she should because she was focusing on her husband.  State she is now able to focus more on herself.  Conditions: Per chart has history of diabetes (A1C - 7.5), lung cancer in remission, HTN, A-flutter, CAD, and HLD.  Medications: Report being able to take medications independently, concerned about the cost for Farxiga as well as Eliquis.    Appointments: Was seen by her PCP yesterday, follow up within the next 6 months.    This care manager will place referral to pharmacy.  Provided member with this care manager's contact information, advised to contact with questions.  Denies any urgent concerns, will follow up within the next 2 weeks.   Fall Risk  02/06/2019 10/22/2018 07/19/2018 03/07/2018 01/05/2017  Falls in the past year? 0 0 0 0 No  Number falls in past yr: 0 0 0 0 -  Injury with Fall? 0 0 0 0 -  Risk for fall due to : - - - Impaired mobility;Impaired balance/gait -  Follow up - -  Falls evaluation completed Falls evaluation completed;Education provided -   Depression screen [facility] 2/9 02/06/2019 10/22/2018 03/07/2018 10/12/2016  Decreased Interest 0 0 0 0  Down, Depressed, Hopeless 0 1 0 0  PHQ - 2 Score 0 1 0 0  Some recent data might be hidden   [facility] CM Care Plan Problem One     Most Recent Value  Care Plan Problem One  Knowledge deficit regarding diabetes management as evidenced by elevated A1C  Role Documenting the Problem One  Care Management [place] for Problem One  Active  [facility] Long Term Goal   Member will report decrease in A1C to less than 7 within the next 90 days  [facility] Long Term Goal Start Date  02/06/19  Interventions for Problem One Long Term Goal  Member educated on diabetes management including checking blood sugars daily and managing diet.  [facility] CM Short Term Goal #1   Member will report plan to afford all medications within the next 2 weeks  [facility] CM Short Term Goal #1 Start Date  02/06/19  Interventions for Short Term Goal #1  Referral placed to pharmacy for medication assistance      [name], RN, MSN [place] [phone]

## 2019-02-14 ENCOUNTER — Ambulatory Visit: Payer: Self-pay | Admitting: Pharmacist

## 2019-02-14 ENCOUNTER — Other Ambulatory Visit: Payer: Self-pay | Admitting: Pharmacist

## 2019-02-14 NOTE — Patient Outreach (Signed)
[facility]) Care Management  02/14/2019  Nicole [name] 1940/06/27 [id number]   Patient was called regarding medication assistance. Unfortunately, she did not answer her phone. HIPAA compliant message was left the voicemail on her home number and with a gentleman who identified himself as her son on the mobile number listed for her.  Patient was sent an unsuccessful outreach letter 02/06/2019.  Plan: Call patient back in 7-10 business days. If no answer, close patient's case.  Elayne Guerin, PharmD, [place] Clinical Pharmacist [phone]

## 2019-02-20 ENCOUNTER — Ambulatory Visit (HOSPITAL_COMMUNITY)
Admission: RE | Admit: 2019-02-20 | Discharge: 2019-02-20 | Disposition: A | Discharge status: Home / Self Care | Payer: Medicare Other | Source: Ambulatory Visit | Attending: Physician Assistant | Admitting: Physician Assistant

## 2019-02-20 ENCOUNTER — Other Ambulatory Visit: Payer: Self-pay | Admitting: *Deleted

## 2019-02-20 ENCOUNTER — Other Ambulatory Visit: Payer: Self-pay

## 2019-02-20 VITALS — BP 136/80 | HR 88 | Ht 67.0 in | Wt 167.0 lb

## 2019-02-20 DIAGNOSIS — Z8249 Family history of ischemic heart disease and other diseases of the circulatory system: Secondary | ICD-10-CM | POA: Insufficient documentation

## 2019-02-20 DIAGNOSIS — E118 Type 2 diabetes mellitus with unspecified complications: Secondary | ICD-10-CM | POA: Diagnosis not present

## 2019-02-20 DIAGNOSIS — M810 Age-related osteoporosis without current pathological fracture: Secondary | ICD-10-CM | POA: Diagnosis not present

## 2019-02-20 DIAGNOSIS — I4892 Unspecified atrial flutter: Secondary | ICD-10-CM | POA: Insufficient documentation

## 2019-02-20 DIAGNOSIS — Z79899 Other long term (current) drug therapy: Secondary | ICD-10-CM | POA: Diagnosis not present

## 2019-02-20 DIAGNOSIS — I480 Paroxysmal atrial fibrillation: Secondary | ICD-10-CM | POA: Insufficient documentation

## 2019-02-20 DIAGNOSIS — Z7901 Long term (current) use of anticoagulants: Secondary | ICD-10-CM | POA: Insufficient documentation

## 2019-02-20 DIAGNOSIS — I2510 Atherosclerotic heart disease of native coronary artery without angina pectoris: Secondary | ICD-10-CM | POA: Insufficient documentation

## 2019-02-20 DIAGNOSIS — E781 Pure hyperglyceridemia: Secondary | ICD-10-CM | POA: Diagnosis not present

## 2019-02-20 DIAGNOSIS — I10 Essential (primary) hypertension: Secondary | ICD-10-CM | POA: Diagnosis not present

## 2019-02-20 DIAGNOSIS — D6869 Other thrombophilia: Secondary | ICD-10-CM

## 2019-02-20 DIAGNOSIS — Z7984 Long term (current) use of oral hypoglycemic drugs: Secondary | ICD-10-CM | POA: Insufficient documentation

## 2019-02-20 NOTE — Progress Notes (Signed)
Primary Care Physician: [name], MD Primary Cardiologist: Dr Irish Lack Primary Electrophysiologist: none Referring Physician: [facility]   Nicole [name] is a 79 y.o. female with a history of paroxysmal atrial fibrillation, atrial flutter, HTN, DM, CAD, and lung cancer s/p radiation who presents for follow up in the [facility].  The patient was initially diagnosed with atrial fibrillation in 2018 after presenting with symptoms of a rapid, irregular pulse. She was initially on Eliquis but developed rectal bleeding and this was stopped. Patient was seen in ER on 07/29/18 with symptoms of dizziness and palpitations and was found to be in afib with RVR. She was restarted on Eliquis for a CHADS2VASC score of 6 and discharged in rate controlled afib.   On follow up today, patient reports that she has done well from a cardiac standpoint. She has used her PRN BB twice since we saw her last which quickly resolved her heart racing. She does report that she has had hemorrhoidal bleeding and stopped her Eliquis herself about 2 weeks ago.   Today, she denies symptoms of palpitations, chest pain, orthopnea, PND, dizziness, presyncope, syncope, snoring, daytime somnolence, or neurologic sequela. The patient is tolerating medications without difficulties and is otherwise without complaint today.    Atrial Fibrillation Risk Factors:  she does not have symptoms or diagnosis of sleep apnea.   she has a BMI of Body mass index is 26.16 kg/m.[name] Filed Weights   02/20/19 1357  Weight: 75.8 kg    Family History  Problem Relation Age of Onset  . Heart attack Father   . Heart disease Father   . Arthritis Other   . Cancer Other        colon, lst degree relative  . Diabetes Other        st degree relative  . Hyperlipidemia Other   . Hypertension Other   . Colon cancer Neg Hx      Atrial Fibrillation Management history:  Previous antiarrhythmic drugs:  none Previous cardioversions: none Previous ablations: none CHADS2VASC score: 6 Anticoagulation history: Eliquis   Past Medical History:  Diagnosis Date  . A-fib ([place])   . Adenocarcinoma of right lung, stage 3 ([place]) 07/28/2016  . Atrial flutter ([name]) 10/05/2016  . Bronchitis    hx of  . Cancer [facility]) 2004   uterine/cervical  . Cancer-related pain 10/06/2016  . Coronary artery disease 02/21/2017   JAN 2019 Prox RCA lesion is 25% stenosed. Prox Cx lesion is 30% stenosed. Mid Cx lesion is 25% stenosed. Ost 3rd Mrg lesion is 30% stenosed. Prox LAD lesion is 25% stenosed. Mid LAD lesion is 25% stenosed. The left ventricular systolic function is normal. LV end diastolic pressure is normal. The left ventricular ejection fraction is 55-65% by visual estimate. There is no aortic valve stenosis.   N  . Diabetes mellitus    type 2  . Edema 07/12/2017  . Essential hypertension 01/20/2009  . Gallstones   . Headache   . History of radiation therapy 08/09/2016 to 09/19/2016   Right lung was treated to 60 Gy in 30 fractions at 2 Gy per fraction  . Hypertension   . Hypertriglyceridemia 08/22/2012  . Low back pain   . Lung mass    with lymphadenopathy  . Osteoarthritis   . Type II diabetes mellitus with manifestations ([place]) 01/20/2009   Estimated Creatinine Clearance: 49.8 mL/min (by C-G formula based on SCr of 1 mg/dL).   Past Surgical History:  Procedure Laterality Date  . ABDOMINAL HYSTERECTOMY  2004  . APPENDECTOMY  when 79 years old  . COLONOSCOPY  08-05-09   [name]  . INCONTINENCE SURGERY    . KNEE ARTHROSCOPY Left   . LEFT HEART CATH AND CORONARY ANGIOGRAPHY N/A 02/24/2017   Procedure: LEFT HEART CATH AND CORONARY ANGIOGRAPHY;  Surgeon: Jettie Booze, MD;  Location: [place] CV LAB;  Service: Cardiovascular;  Laterality: N/A;  . LEFT HEART CATHETERIZATION WITH CORONARY ANGIOGRAM N/A 07/19/2013   Procedure: LEFT HEART CATHETERIZATION WITH CORONARY ANGIOGRAM;  Surgeon: Sinclair Grooms, MD;  Location: [facility] CATH LAB;  Service: Cardiovascular;  Laterality: N/A;  . POLYPECTOMY  08-05-09   2 polyps  . TOTAL KNEE ARTHROPLASTY Left 03/28/2012   Procedure: LEFT TOTAL KNEE ARTHROPLASTY;  Surgeon: Tobi Bastos, MD;  Location: [facility];  Service: Orthopedics;  Laterality: Left;  . TUBAL LIGATION    . VIDEO BRONCHOSCOPY WITH ENDOBRONCHIAL ULTRASOUND N/A 07/11/2016   Procedure: VIDEO BRONCHOSCOPY WITH ENDOBRONCHIAL ULTRASOUND;  Surgeon: Marshell Garfinkel, MD;  Location: [place];  Service: Pulmonary;  Laterality: N/A;    Current Outpatient Medications  Medication Sig Dispense Refill  . b complex vitamins tablet Take 1 tablet by mouth daily.    . calcium-vitamin D (OSCAL WITH D) 500-200 MG-UNIT tablet Take 2 tablets by mouth daily with breakfast.    . Collagenase POWD by Does not apply route daily.     . CONTOUR NEXT TEST test strip USE 1 STRIP TO CHECK GLUCOSE TWICE DAILY 200 each 2  . dapagliflozin propanediol (FARXIGA) 10 MG TABS tablet Take 10 mg by mouth daily. 90 tablet 1  . diltiazem (CARDIZEM CD) 360 MG 24 hr capsule Take 1 capsule (360 mg total) by mouth daily. 90 capsule 3  . hydrocortisone (ANUSOL-HC) 2.5 % rectal cream Place 1 application rectally 3 (three) times daily. Use for 10 days. (Patient taking differently: Place 1 application rectally daily. Use for 10 days.) 30 g 1  . irbesartan (AVAPRO) 300 MG tablet Take 1 tablet (300 mg total) by mouth daily. 90 tablet 1  . Lancets (ACCU-CHEK SOFT TOUCH) lancets Use to check blood sugars daily Dx E11.9 100 each 3  . metFORMIN (GLUCOPHAGE-XR) 500 MG 24 hr tablet TAKE 2 TABLETS(1000 MG) BY MOUTH DAILY WITH BREAKFAST 180 tablet 3  . metoprolol tartrate (LOPRESSOR) 25 MG tablet Take 1 tablet every 8 hours AS NEEDED for rapid heart rate 30 tablet 1  . Multiple Vitamins-Minerals (MULTIPLE VITAMINS/WOMENS PO) Take by mouth daily.    [name] omega-3 acid ethyl esters (LOVAZA) 1 g capsule Take 2 capsules (2 g total) by mouth 2 (two) times daily.  (Patient taking differently: Take 2 g by mouth 2 (two) times daily. 2 capsules twice daily) 360 capsule 1  . PREBIOTIC PRODUCT PO Take 1 capsule by mouth daily.    . rosuvastatin (CRESTOR) 5 MG tablet Take 1 tablet (5 mg total) by mouth daily. 90 tablet 1  . Turmeric 500 MG CAPS Take 1,000 mg by mouth daily.     No current facility-administered medications for this encounter.    Allergies  Allergen Reactions  . No Known Allergies     Social History   Socioeconomic History  . Marital status: Married    Spouse name: Not on file  . Number of children: 3  . Years of education: Not on file  . Highest education level: Not on file  Occupational History  . Occupation: retired    Fish farm manager: RETIRED  Tobacco Use  . Smoking status: Never Smoker  . Smokeless tobacco: Never Used  Substance and Sexual Activity  . Alcohol use: No    Alcohol/week: 0.0 standard drinks  . Drug use: No  . Sexual activity: Not Currently  Other Topics Concern  . Not on file  Social History Narrative   Regular exercise- Yes   Social Determinants of Health   Financial Resource Strain: Low Risk   . Difficulty of Paying Living Expenses: Not hard at all  Food Insecurity: No Food Insecurity  . Worried About Charity fundraiser in the Last Year: Never true  . Ran Out of Food in the Last Year: Never true  Transportation Needs: No Transportation Needs  . Lack of Transportation (Medical): No  . Lack of Transportation (Non-Medical): No  Physical Activity: Inactive  . Days of Exercise per Week: 0 days  . Minutes of Exercise per Session: 0 min  Stress: No Stress Concern Present  . Feeling of Stress : Only a little  Social Connections: Not Isolated  . Frequency of Communication with Friends and Family: More than three times a week  . Frequency of Social Gatherings with Friends and Family: More than three times a week  . Attends Religious Services: 1 to 4 times per year  . Active Member of Clubs or Organizations:  Yes  . Attends Archivist Meetings: 1 to 4 times per year  . Marital Status: Married  Human resources officer Violence: Not At Risk  . Fear of Current or Ex-Partner: No  . Emotionally Abused: No  . Physically Abused: No  . Sexually Abused: No     ROS- All systems are reviewed and negative except as per the HPI above.  Physical Exam: Vitals:   02/20/19 1357  BP: 136/80  Pulse: 88  Weight: 75.8 kg  Height: 5' 7"  (1.702 m)    GEN- The patient is well appearing elderly female, alert and oriented x 3 today.   HEENT-head normocephalic, atraumatic, sclera clear, conjunctiva pink, hearing intact, trachea midline. Lungs- Clear to ausculation bilaterally, normal work of breathing Heart- Regular rate and rhythm, no murmurs, rubs or gallops  GI- soft, NT, ND, + BS Extremities- no clubbing, cyanosis, or edema MS- no significant deformity or atrophy Skin- no rash or lesion Psych- euthymic mood, full affect Neuro- strength and sensation are intact   Wt Readings from Last 3 Encounters:  02/20/19 75.8 kg  02/05/19 75.4 kg  01/15/19 77.8 kg    EKG today demonstrates SR HR 88, PR 166, QRS 90, QTc 418  Echo 10/05/16 demonstrated  - Left ventricle: Abnormal septal motion. There was mild concentric   hypertrophy. Systolic function was normal. The estimated ejection   fraction was in the range of 55% to 60%. Doppler parameters are   consistent with abnormal left ventricular relaxation (grade 1   diastolic dysfunction). - Aortic valve: Nodular calcification of the non coronary cusp.   There was mild regurgitation. - Mitral valve: Calcified annulus. Mildly thickened leaflets . - Atrial septum: A patent foramen ovale cannot be excluded. - Impressions: Abnormal GLS -12.4   Epic records are reviewed at length today  Assessment and Plan:  1. Paroxysmal atrial fibrillation/atrial flutter Patient appears to be maintaining SR. Continue metoprolol 25 mg PRN q12 hrs for heart  racing. Continue diltiazem 360 mg daily We had a long discussion about he stroke risk and the risks and benefits of anticoagulation. Her hemorrhoidal bleeding has resolved.  Resume Eliquis 5 mg BID.  Patient encouraged to reach out to her established GI specialist if she has issues again instead of stopping the anticoagulation.   This patients CHA2DS2-VASc Score and unadjusted Ischemic Stroke Rate (% per year) is equal to 9.7 % stroke rate/year from a score of 6  Above score calculated as 1 point each if present [CHF, HTN, DM, Vascular=MI/PAD/Aortic Plaque, Age if 65-74, or Female] Above score calculated as 2 points each if present [Age > 75, or Stroke/TIA/TE]   2. CAD Nonobstructive by [facility] 02/24/17. No anginal symptoms.  Followed by Dr Irish Lack   3. HTN Stable, no changes today.   Follow up with Dr Irish Lack in 3 months. AF clinic in 6 months.   [facility] [address] [place],  [address] [phone] 02/20/2019 2:34 PM

## 2019-02-20 NOTE — Patient Outreach (Signed)
[facility]) Care Management  02/20/2019  Nicole [name] 09/29/1940 [id number]   Call placed to member to complete initial assessment and to follow up on diabetes management, no answer.  HIPAA compliant voice message left, will follow up within the next 3-4 business days.  Unsuccessful outreach letter sent on 1/13 by pharmacist.  [name], RN, MSN [place] Manager [phone]

## 2019-02-23 ENCOUNTER — Ambulatory Visit: Payer: Medicare Other

## 2019-02-25 ENCOUNTER — Ambulatory Visit: Payer: Medicare Other

## 2019-02-26 ENCOUNTER — Other Ambulatory Visit: Payer: Self-pay | Admitting: *Deleted

## 2019-02-26 NOTE — Patient Outreach (Signed)
[facility]) Care Management  02/26/2019  Nicole [name] May 23, 1940 [id number]   Outreach attempt #2, successful however member state this is not a good time to talk as she is driving.  Will make 3rd attempt to complete assessment within the next 3-4 business days.  [name], [place], MSN [place] [phone]

## 2019-02-27 ENCOUNTER — Ambulatory Visit (HOSPITAL_COMMUNITY): Payer: Medicare Other | Admitting: Physician Assistant

## 2019-03-01 ENCOUNTER — Other Ambulatory Visit: Payer: Self-pay | Admitting: *Deleted

## 2019-03-01 ENCOUNTER — Other Ambulatory Visit: Payer: Self-pay | Admitting: Pharmacist

## 2019-03-01 NOTE — Patient Outreach (Signed)
[facility]) Care Management  03/01/2019  Nicole [name] 13-Jun-1940 [id number]   Patient was called regarding medication assistance with Wilder Glade and Eliquis. HIPAA identifiers were obtained.    After explaining who I was and the purpose of my call, patient said she did not have time to talk. She said she had some workers coming to her home and today was not a good day.  She requested a call back.  Today's call was the third attempt to reach the patient.   Plan: Await a call back from the patient. Call patient back in 3-4 weeks.  Elayne Guerin, PharmD, [place] Clinical Pharmacist [phone]

## 2019-03-01 NOTE — Patient Outreach (Signed)
[facility] [facility]) Care Management  03/01/2019  Nicole [name] 06-09-1940 [id number]   Outreach attempt #3, successful but she state she does not have time to talk.  Will make 4th and final attempt to complete assessment within the next week.  [name], [place], MSN [place] [phone]

## 2019-03-03 ENCOUNTER — Ambulatory Visit: Payer: Medicare Other

## 2019-03-08 ENCOUNTER — Other Ambulatory Visit: Payer: Self-pay | Admitting: *Deleted

## 2019-03-08 NOTE — Patient Outreach (Signed)
[place] [facility]) Care Management  03/08/2019  Nicole [name] [birth year] [id number]   Outreach attempt #4, successful.  Call placed to member to follow up on management of diabetes and contact with pharmacist for medication assistance.  She report she is still in need of assistance with paying for Wilder Glade, advised that pharmacist has been trying to contact her to discuss process.  Provided her with contact information, advised to call as soon as possible.  State she has been managing her diabetes well, state "I have been doing this for a long time, I know what to do."    Report she has stopped taking her Eliquis due to some possible side effects (slight bleeding, possible hemmrhoids).  Advised against stopping medication without consulting with MD, state she will call next week to let them know.  State sh feel if she needs it she will restart. Denies any signs/symptoms of A-fib complications at this time.    Will place referral to health coach for ongoing disease management, no complex needs at this time.  [name], [place], MSN [place] [phone]

## 2019-03-11 ENCOUNTER — Other Ambulatory Visit: Payer: Self-pay | Admitting: *Deleted

## 2019-03-21 ENCOUNTER — Other Ambulatory Visit: Payer: Self-pay | Admitting: *Deleted

## 2019-03-21 NOTE — Patient Outreach (Signed)
[place] [facility]) Care Management  03/21/2019  Nicole [name] 02/21/1940 [id number]   [place] Monthly Outreach  Referral Date:  03/11/2019 Referral Source:  Transfer from [facility] Reason for Referral:  Continued Disease Management Education Insurance:  Blue Cross [facility] Shield Medicare   Outreach Attempt:  Outreach attempt #1 to patient for introduction and follow up.  Patient answered and stated she was driving on the highway and requested call back.   Plan: RN Health Coach will make another outreach attempt within the month of March.  [place] [phone] [email or web address]

## 2019-03-29 ENCOUNTER — Encounter: Payer: Self-pay | Admitting: Family

## 2019-03-29 ENCOUNTER — Other Ambulatory Visit: Payer: Self-pay

## 2019-03-29 ENCOUNTER — Other Ambulatory Visit: Payer: Self-pay | Admitting: Family

## 2019-03-29 ENCOUNTER — Ambulatory Visit: Payer: Medicare Other | Admitting: Family

## 2019-03-29 ENCOUNTER — Ambulatory Visit (HOSPITAL_COMMUNITY)
Admission: RE | Admit: 2019-03-29 | Discharge: 2019-03-29 | Disposition: A | Discharge status: Home / Self Care | Payer: Medicare Other | Source: Ambulatory Visit | Attending: Family | Admitting: Family

## 2019-03-29 VITALS — BP 116/70 | HR 98 | Temp 98.1°F | Ht 67.0 in | Wt 161.1 lb

## 2019-03-29 DIAGNOSIS — R2242 Localized swelling, mass and lump, left lower limb: Secondary | ICD-10-CM

## 2019-03-29 MED ORDER — FUROSEMIDE 20 MG PO TABS: 20.0000 mg | ORAL_TABLET | Freq: Every day | ORAL | 0 refills | Status: DC | PRN

## 2019-03-29 MED ORDER — HYDROCORTISONE (PERIANAL) 2.5 % EX CREA: 1.0000 "application " | TOPICAL_CREAM | Freq: Three times a day (TID) | CUTANEOUS | 1 refills | Status: DC

## 2019-03-29 NOTE — Progress Notes (Signed)
Lower extremity venous has been completed.   Preliminary results in CV Proc.   Nicole [name] 03/29/2019 3:53 PM

## 2019-03-29 NOTE — Progress Notes (Signed)
Nicole [name] is a 79 y.o. female with the following history as recorded in EpicCare:  Patient Active Problem List   Diagnosis Date Noted  . Paroxysmal atrial fibrillation ([place]) 02/20/2019  . Secondary hypercoagulable state ([place]) 02/20/2019  . External hemorrhoids 08/16/2018  . Hyperlipidemia with target LDL less than 100 07/18/2018  . Atherosclerosis of aorta ([place]) 07/18/2018  . Spinal stenosis at L4-L5 level 01/25/2018  . Arthritis of sacroiliac joint 01/11/2018  . Edema 07/12/2017  . Coronary artery disease 02/21/2017  . Pneumonitis, radiation ([place]) 01/26/2017  . Encounter for antineoplastic immunotherapy 10/12/2016  . Cancer-related pain 10/06/2016  . Atrial flutter ([place]) 10/05/2016  . Adenocarcinoma of right lung, stage 3 ([place]) 07/28/2016  . Encounter for antineoplastic chemotherapy 07/28/2016  . Pancreatic cyst 11/09/2015  . Routine general medical examination at a health care facility 06/30/2014  . Gout 01/18/2013  . Hypertriglyceridemia 08/22/2012  . Osteopenia 10/31/2011  . Type II diabetes mellitus with manifestations ([place]) 01/20/2009  . Essential hypertension 01/20/2009  . Osteoarthritis 01/20/2009    Current Outpatient Medications  Medication Sig Dispense Refill  . b complex vitamins tablet Take 1 tablet by mouth daily.    . calcium-vitamin D (OSCAL WITH D) 500-200 MG-UNIT tablet Take 2 tablets by mouth daily with breakfast.    . Collagenase POWD by Does not apply route daily.     . CONTOUR NEXT TEST test strip USE 1 STRIP TO CHECK GLUCOSE TWICE DAILY 200 each 2  . dapagliflozin propanediol (FARXIGA) 10 MG TABS tablet Take 10 mg by mouth daily. 90 tablet 1  . diltiazem (CARDIZEM CD) 360 MG 24 hr capsule Take 1 capsule (360 mg total) by mouth daily. 90 capsule 3  . ELIQUIS 5 MG TABS tablet Take 5 mg by mouth 2 (two) times daily.    . hydrocortisone (ANUSOL-HC) 2.5 % rectal cream Place 1 application rectally 3 (three) times daily. Use for 10 days. 30 g 1  .  irbesartan (AVAPRO) 300 MG tablet Take 1 tablet (300 mg total) by mouth daily. 90 tablet 1  . Lancets (ACCU-CHEK SOFT TOUCH) lancets Use to check blood sugars daily Dx E11.9 100 each 3  . metFORMIN (GLUCOPHAGE-XR) 500 MG 24 hr tablet TAKE 2 TABLETS(1000 MG) BY MOUTH DAILY WITH BREAKFAST 180 tablet 3  . metoprolol tartrate (LOPRESSOR) 25 MG tablet Take 1 tablet every 8 hours AS NEEDED for rapid heart rate 30 tablet 1  . Multiple Vitamins-Minerals (MULTIPLE VITAMINS/WOMENS PO) Take by mouth daily.    [name] omega-3 acid ethyl esters (LOVAZA) 1 g capsule Take 2 capsules (2 g total) by mouth 2 (two) times daily. (Patient taking differently: Take 2 g by mouth 2 (two) times daily. 2 capsules twice daily) 360 capsule 1  . PREBIOTIC PRODUCT PO Take 1 capsule by mouth daily.    . rosuvastatin (CRESTOR) 5 MG tablet Take 1 tablet (5 mg total) by mouth daily. 90 tablet 1  . Turmeric 500 MG CAPS Take 1,000 mg by mouth daily.     No current facility-administered medications for this visit.    Allergies: No known allergies  Past Medical History:  Diagnosis Date  . A-fib ([place])   . Adenocarcinoma of right lung, stage 3 ([place]) 07/28/2016  . Atrial flutter ([place]) 10/05/2016  . Bronchitis    hx of  . Cancer [facility]) 2004   uterine/cervical  . Cancer-related pain 10/06/2016  . Coronary artery disease 02/21/2017   JAN 2019 Prox RCA lesion is 25% stenosed. Prox Cx lesion is  30% stenosed. Mid Cx lesion is 25% stenosed. Ost 3rd Mrg lesion is 30% stenosed. Prox LAD lesion is 25% stenosed. Mid LAD lesion is 25% stenosed. The left ventricular systolic function is normal. LV end diastolic pressure is normal. The left ventricular ejection fraction is 55-65% by visual estimate. There is no aortic valve stenosis.   N  . Diabetes mellitus    type 2  . Edema 07/12/2017  . Essential hypertension 01/20/2009  . Gallstones   . Headache   . History of radiation therapy 08/09/2016 to 09/19/2016   Right lung was treated to 60 Gy in 30  fractions at 2 Gy per fraction  . Hypertension   . Hypertriglyceridemia 08/22/2012  . Low back pain   . Lung mass    with lymphadenopathy  . Osteoarthritis   . Type II diabetes mellitus with manifestations ([place]) 01/20/2009   Estimated Creatinine Clearance: 49.8 mL/min (by C-G formula based on SCr of 1 mg/dL).    Past Surgical History:  Procedure Laterality Date  . ABDOMINAL HYSTERECTOMY  2004  . APPENDECTOMY  when 79 years old  . COLONOSCOPY  08-05-09   [name]  . INCONTINENCE SURGERY    . KNEE ARTHROSCOPY Left   . LEFT HEART CATH AND CORONARY ANGIOGRAPHY N/A 02/24/2017   Procedure: LEFT HEART CATH AND CORONARY ANGIOGRAPHY;  Surgeon: Jettie Booze, MD;  Location: [place] CV LAB;  Service: Cardiovascular;  Laterality: N/A;  . LEFT HEART CATHETERIZATION WITH CORONARY ANGIOGRAM N/A 07/19/2013   Procedure: LEFT HEART CATHETERIZATION WITH CORONARY ANGIOGRAM;  Surgeon: Sinclair Grooms, MD;  Location[facility] C[facility] LAB;  Service: Cardiovascular;  Laterality: N/A;  . POLYPECTOMY  08-05-09   2 polyps  . TOTAL KNEE ARTHROPLASTY Left 03/28/2012   Procedure: LEFT TOTAL KNEE ARTHROPLASTY;  Surgeon: Tobi Bastos, MD;  [facility];  Service: Orthopedics;  Laterality: Left;  . TUBAL LIGATION    . VIDEO BRONCHOSCOPY WITH ENDOBRONCHIAL ULTRASOUND N/A 07/11/2016   Procedure: VIDEO BRONCHOSCOPY WITH ENDOBRONCHIAL ULTRASOUND;  Surgeon: Marshell Garfinkel, MD;  Location: [place];  Service: Pulmonary;  Laterality: N/A;    Family History  Problem Relation Age of Onset  . Heart attack Father   . Heart disease Father   . Arthritis Other   . Cancer Other        colon, lst degree relative  . Diabetes Other        st degree relative  . Hyperlipidemia Other   . Hypertension Other   . Colon cancer Neg Hx     Social History   Tobacco Use  . Smoking status: Never Smoker  . Smokeless tobacco: Never Used  Substance Use Topics  . Alcohol use: No    Alcohol/week: 0.0 standard drinks    Subjective:   Brought to the office with concerns for left foot/ lower extremity swelling; 10 day history of extreme swelling in lower left extremity;  Has used old left over Lasix to help with symptoms- did offer some benefit; had massage last night and seemed to help with the symptoms. However, swelling was just as prominent this morning. History of A. Fib- had not been taking her Eliquis due to concerns for bleeding with hemorrhoids. Notes she did re-start it in the past few days however; denies any chest pain or shortness of breath today;   Objective:  Vitals:   03/29/19 1346  BP: 116/70  Pulse: 98  Temp: 98.1 F (36.7 C)  TempSrc: Oral  SpO2: 99%  Weight: 161  lb 1.6 oz (73.1 kg)  Height: 5' 7" (1.702 m)    General: Well developed, well nourished, in no acute distress  Skin : Warm and dry.  Head: Normocephalic and atraumatic  Lungs: Respirations unlabored; clear to auscultation bilaterally without wheeze, rales, rhonchi  CVS exam: normal rate and regular rhythm.  Extremities: marked edema and erythema of left lower extremity, cyanosis, clubbing  Vessels: Symmetric bilaterally  Neurologic: Alert and oriented; speech intact; face symmetrical; moves all extremities well; CNII-XII intact without focal deficit   Assessment:  1. Localized swelling of left lower extremity     Plan:  Will update venous doppler today; she is already on her Eliquis and taking bid; If no clot is found, she will use Lasix to help with the swelling and understands she needs to see her cardiologist in follow-up;  This visit occurred during the SARS-CoV-2 public health emergency.  Safety protocols were in place, including screening questions prior to the visit, additional usage of staff PPE, and extensive cleaning of exam room while observing appropriate contact time as indicated for disinfecting solutions.     No follow-ups on file.  No orders of the defined types were placed in this encounter.   Requested  Prescriptions   Signed Prescriptions Disp Refills  . hydrocortisone (ANUSOL-HC) 2.5 % rectal cream 30 g 1    Sig: Place 1 application rectally 3 (three) times daily. Use for 10 days.

## 2019-03-31 NOTE — Progress Notes (Signed)
Cardiology Office Note   Date:  04/02/2019   ID:  Nicole [name], Nicole [name] [birth year], MRN [id number]  PCP:  [name], MD    No chief complaint on file.  Atrial flibrillation  Wt Readings from Last 3 Encounters:  04/02/19 162 lb 1.9 oz (73.5 kg)  03/29/19 161 lb 1.6 oz (73.1 kg)  02/20/19 167 lb (75.8 kg)       History of Present Illness: Nicole [name] is a 79 y.o. female  has history of lung cancer status post radiation and was felt she might have radiation pneumonitis and was treated with steroids which seemed to help her cough. Cardiac catheterization 02/24/2017 showed nonobstructive CAD with 25% proximal RCA, 30% proximal circumflex, 25% mid circumflex, 30% ostial third marginal, 25% proximal and mid LAD, normal LVEF 55 to 65%, no aortic valve stenosis. Aggressive medical therapy recommended   I saw the patient 06/06/2017 at which time she was having rectal bleeding on Eliquis ( for atrial flutter) felt secondary to hemorrhoids. She had stopped her Eliquis andthe rectal bleeding resolved. Stool cards were negative and hemoglobin was stable. Colonoscopy found diverticulosis as well as hemorrhoids which seem to be the cause of the rectal bleeding. If recurrent rectal bleeding he recommended surgical removal of the hemorrhoids  In July 2019, she was complaining of increased stress taking care of her husband. She was tachycardic in the office just despite taking her diltiazem. 48-hour monitor showed normal sinus rhythm with occasional PACs and PVCs, no significant bradycardia average heart rate 96 bpm. On follow-up in July she was still under a lot of stress taking care of her husband as well over 2 grandchildren one who is autistic. Heart rate was still 98 blood pressure was up. We increased her diltiazem to 360 mg daily. Had some lower extremity edema and we discussed possible diuretic pressure remained elevated.  It was noted in the past that: "she has had some  stress with her husband's health- he has had severe bleeding requiring transfusion.  She is not sure that he will survive this.  Apparently, he had bypass surgery 40 years ago in [place].  It appears that now he has an LVAD.- Her husband passed away on comfort care in 11/2018.  Since the last visit: "Patient was seen in ER on 07/29/18 with symptoms of dizziness and palpitations and was found to be in afib with RVR. She was restarted on Eliquis for a CHADS2VASC score of 6 and discharged in rate controlled afib. "  She was seen in A. fib clinic.  She was in normal sinus rhythm at the end of January.  Since the last visit, she has had leg swelling and right hand swelling.  She had a splinter which she removed, but the hand has swollen.      Past Medical History:  Diagnosis Date  . A-fib ([place])   . Adenocarcinoma of right lung, stage 3 ([place]) 07/28/2016  . Atrial flutter ([place]) 10/05/2016  . Bronchitis    hx of  . 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 Cancer-related pain 10/06/2016  . Coronary artery disease 02/21/2017   JAN 2019 Prox RCA lesion is 25% stenosed. Prox Cx lesion is 30% stenosed. Mid Cx lesion is 25% stenosed. Ost 3rd Mrg lesion is 30% stenosed. Prox LAD lesion is 25% stenosed. Mid LAD lesion is 25% stenosed. The left ventricular systolic function is normal. LV end diastolic pressure is normal. The left ventricular ejection fraction is 55-65% by  visual estimate. There is no aortic valve stenosis.   N  . Diabetes mellitus    type 2  . Edema 07/12/2017  . Essential hypertension 01/20/2009  . Gallstones   . Headache   . History of radiation therapy 08/09/2016 to 09/19/2016   Right lung was treated to 60 Gy in 30 fractions at 2 Gy per fraction  . Hypertension   . Hypertriglyceridemia 08/22/2012  . Low back pain   . Lung mass    with lymphadenopathy  . Osteoarthritis   . Type II diabetes mellitus with manifestations ([place]) 01/20/2009   Estimated Creatinine Clearance: 49.8 mL/min (by  C-G formula based on SCr of 1 mg/dL).    Past Surgical History:  Procedure Laterality Date  . ABDOMINAL HYSTERECTOMY  2004  . APPENDECTOMY  when 79 years old  . COLONOSCOPY  08-05-09   [name]  . INCONTINENCE SURGERY    . KNEE ARTHROSCOPY Left   . LEFT HEART CATH AND CORONARY ANGIOGRAPHY N/A 02/24/2017   Procedure: LEFT HEART CATH AND CORONARY ANGIOGRAPHY;  Surgeon: Jettie Booze, MD;  Location: [facility];  Service: Cardiovascular;  Laterality: N/A;  . LEFT HEART CATHETERIZATION WITH CORONARY ANGIOGRAM N/A 07/19/2013   Procedure: LEFT HEART CATHETERIZATION WITH CORONARY ANGIOGRAM;  Surgeon: Sinclair Grooms, MD;  Location: [facility];  Service: Cardiovascular;  Laterality: N/A;  . POLYPECTOMY  08-05-09   2 polyps  . TOTAL KNEE ARTHROPLASTY Left 03/28/2012   Procedure: LEFT TOTAL KNEE ARTHROPLASTY;  Surgeon: Tobi Bastos, MD;  Location: [facility];  Service: Orthopedics;  Laterality: Left;  . TUBAL LIGATION    . VIDEO BRONCHOSCOPY WITH ENDOBRONCHIAL ULTRASOUND N/A 07/11/2016   Procedure: VIDEO BRONCHOSCOPY WITH ENDOBRONCHIAL ULTRASOUND;  Surgeon: Marshell Garfinkel, MD;  Location: [facility];  Service: Pulmonary;  Laterality: N/A;     Current Outpatient Medications  Medication Sig Dispense Refill  . b complex vitamins tablet Take 1 tablet by mouth daily.    . calcium-vitamin D (OSCAL WITH D) 500-200 MG-UNIT tablet Take 2 tablets by mouth daily with breakfast.    . Collagenase POWD by Does not apply route daily.     . CONTOUR NEXT TEST test strip USE 1 STRIP TO CHECK GLUCOSE TWICE DAILY 200 each 2  . dapagliflozin propanediol (FARXIGA) 10 MG TABS tablet Take 10 mg by mouth daily. 90 tablet 1  . diltiazem (CARDIZEM CD) 360 MG 24 hr capsule Take 1 capsule (360 mg total) by mouth daily. 90 capsule 3  . ELIQUIS 5 MG TABS tablet Take 5 mg by mouth 2 (two) times daily.    . furosemide (LASIX) 20 MG tablet TAKE 1 TABLET DAILY AS NEEDED FOR FLUID. CAN TAKE 2ND TABLET IN AFTERNOON AS  NEEDED 180 tablet 0  . hydrocortisone (ANUSOL-HC) 2.5 % rectal cream Place 1 application rectally 3 (three) times daily. Use for 10 days. 30 g 1  . irbesartan (AVAPRO) 300 MG tablet Take 1 tablet (300 mg total) by mouth daily. 90 tablet 1  . Lancets (ACCU-CHEK SOFT TOUCH) lancets Use to check blood sugars daily Dx E11.9 100 each 3  . metFORMIN (GLUCOPHAGE-XR) 500 MG 24 hr tablet TAKE 2 TABLETS(1000 MG) BY MOUTH DAILY WITH BREAKFAST 180 tablet 3  . metoprolol tartrate (LOPRESSOR) 25 MG tablet Take 1 tablet every 8 hours AS NEEDED for rapid heart rate 30 tablet 1  . Multiple Vitamins-Minerals (MULTIPLE VITAMINS/WOMENS PO) Take by mouth daily.    [name] omega-3 acid ethyl esters (LOVAZA) 1 g  capsule Take 2 capsules (2 g total) by mouth 2 (two) times daily. (Patient taking differently: Take 2 g by mouth 2 (two) times daily. 2 capsules twice daily) 360 capsule 1  . PREBIOTIC PRODUCT PO Take 1 capsule by mouth daily.    . rosuvastatin (CRESTOR) 5 MG tablet Take 1 tablet (5 mg total) by mouth daily. 90 tablet 1  . Turmeric 500 MG CAPS Take 1,000 mg by mouth daily.     No current facility-administered medications for this visit.    Allergies:   No known allergies    Social History:  The patient  reports that she has never smoked. She has never used smokeless tobacco. She reports that she does not drink alcohol or use drugs.   Family History:  The patient's family history includes Arthritis in an other family member; Cancer in an other family member; Diabetes in an other family member; Heart attack in her father; Heart disease in her father; Hyperlipidemia in an other family member; Hypertension in an other family member.    ROS:  Please see the history of present illness.   Otherwise, review of systems are positive for right hand swelling.   All other systems are reviewed and negative.    PHYSICAL EXAM: VS:  BP 90/60   Pulse (!) 33   Ht 5\' 7"  (1.702 m)   Wt 162 lb 1.9 oz (73.5 kg)   SpO2 98%   BMI  25.39 kg/m  , BMI Body mass index is 25.39 kg/m. GEN: Well nourished, well developed, in no acute distress  HEENT: normal  Neck: no JVD, carotid bruits, or masses Cardiac: RRR; no murmurs, rubs, or gallops,; bilateral lower extremity edema  Respiratory:  clear to auscultation bilaterally, normal work of breathing GI: soft, nontender, nondistended, + BS MS: no deformity or atrophy ; swollen right hand Skin: warm and dry, no rash Neuro:  Strength and sensation are intact Psych: euthymic mood, full affect   EKG:   The ekg ordered 1/27 demonstrates normal sinus rhythm, no ST changes   Recent Labs: 07/29/2018: B Natriuretic Peptide 89.7; Magnesium 2.1 08/01/2018: TSH 0.427 01/10/2019: ALT 19; Hemoglobin 14.4; Platelet Count 222 02/05/2019: BUN 24; Creatinine, Ser 1.05; Potassium 4.1; Sodium 138   Lipid Panel    Component Value Date/Time   CHOL 185 02/05/2019 1137   TRIG 162.0 (H) 02/05/2019 1137   HDL 48.70 02/05/2019 1137   CHOLHDL 4 02/05/2019 1137   VLDL 32.4 02/05/2019 1137   LDLCALC 104 (H) 02/05/2019 1137   LDLDIRECT 108.0 07/18/2018 1549     Other studies Reviewed: Additional studies/ records that were reviewed today with results demonstrating: PMD labs reviewed.   ASSESSMENT AND PLAN:  1. Atrial flutter: Diltiazem for rate control.  Eliquis for stroke prevention.  If some type of procedure is needed for her hand, would have to hold Eliquis for 2 days prior. 2. HTN: The current medical regimen is effective;  continue present plan and medications.  BPs from home reviewed.  Readings are typically in the 229N to 989Q systolic range.  She is not having any dizziness.  If her blood pressure were to get lower and she were to have symptoms, would decrease her irbesartan to 150 mg daily. 3. CAD: Mild CAD by prior cath. No angina 4. DM2: A1C 7.5 in Jan 2021 5. Increased TG: 162 in Jan 2021.  Healthy diet.   6. LE edema: Elevate legs, compression stockings.  Refill as needed Lasix.   In regards to  her right hand swelling, I spoke to Dr. Ronnald Ramp and he will see her later today.   Current medicines are reviewed at length with the patient today.  The patient concerns regarding her medicines were addressed.  The following changes have been made:  No change  Labs/ tests ordered today include:  No orders of the defined types were placed in this encounter.   Recommend 150 minutes/week of aerobic exercise Low fat, low carb, high fiber diet recommended  Disposition:   FU in 6 months   Signed, Larae Grooms, MD  04/02/2019 8:48 AM    [facility] [address], [place], [place]  [address] Phone: [phone]; Fax: [phone]

## 2019-04-01 ENCOUNTER — Telehealth: Payer: Self-pay

## 2019-04-01 NOTE — Telephone Encounter (Signed)
Patient calling and states that she was recently in the office. States that she was in for a possible blood clot in her leg. States that now she is having some pain in her right thumb joint. States that there is inflammation and it is swollen. She also has difficulty starting the car with that finger. Would like to know what she could do about this? Does not wish to make appointment at this time.

## 2019-04-02 ENCOUNTER — Encounter: Payer: Self-pay | Admitting: Internal Medicine

## 2019-04-02 ENCOUNTER — Other Ambulatory Visit: Payer: Self-pay

## 2019-04-02 ENCOUNTER — Encounter: Payer: Self-pay | Admitting: Interventional Cardiology

## 2019-04-02 ENCOUNTER — Inpatient Hospital Stay (HOSPITAL_COMMUNITY)
Admission: EM | Admit: 2019-04-02 | Discharge: 2019-04-05 | DRG: 854 | Disposition: A | Discharge status: Home / Self Care | Payer: Medicare Other | Source: Ambulatory Visit | Attending: Internal Medicine | Admitting: Internal Medicine

## 2019-04-02 ENCOUNTER — Ambulatory Visit (INDEPENDENT_AMBULATORY_CARE_PROVIDER_SITE_OTHER): Payer: Medicare Other

## 2019-04-02 ENCOUNTER — Ambulatory Visit: Payer: Medicare Other | Admitting: Interventional Cardiology

## 2019-04-02 ENCOUNTER — Ambulatory Visit: Payer: Medicare Other | Admitting: Internal Medicine

## 2019-04-02 ENCOUNTER — Encounter (HOSPITAL_COMMUNITY): Payer: Self-pay

## 2019-04-02 VITALS — BP 90/60 | HR 90 | Ht 67.0 in | Wt 162.1 lb

## 2019-04-02 VITALS — BP 100/70 | HR 78 | Temp 99.4°F | Resp 16 | Ht 67.0 in | Wt 162.0 lb

## 2019-04-02 DIAGNOSIS — E781 Pure hyperglyceridemia: Secondary | ICD-10-CM | POA: Diagnosis not present

## 2019-04-02 DIAGNOSIS — D6869 Other thrombophilia: Secondary | ICD-10-CM

## 2019-04-02 DIAGNOSIS — M60041 Infective myositis, right hand: Secondary | ICD-10-CM | POA: Diagnosis present

## 2019-04-02 DIAGNOSIS — L02511 Cutaneous abscess of right hand: Secondary | ICD-10-CM | POA: Diagnosis not present

## 2019-04-02 DIAGNOSIS — L03113 Cellulitis of right upper limb: Secondary | ICD-10-CM

## 2019-04-02 DIAGNOSIS — I2510 Atherosclerotic heart disease of native coronary artery without angina pectoris: Secondary | ICD-10-CM | POA: Diagnosis present

## 2019-04-02 DIAGNOSIS — Z90710 Acquired absence of both cervix and uterus: Secondary | ICD-10-CM

## 2019-04-02 DIAGNOSIS — G8929 Other chronic pain: Secondary | ICD-10-CM | POA: Diagnosis present

## 2019-04-02 DIAGNOSIS — M545 Low back pain: Secondary | ICD-10-CM | POA: Diagnosis present

## 2019-04-02 DIAGNOSIS — I480 Paroxysmal atrial fibrillation: Secondary | ICD-10-CM | POA: Diagnosis present

## 2019-04-02 DIAGNOSIS — Z8249 Family history of ischemic heart disease and other diseases of the circulatory system: Secondary | ICD-10-CM | POA: Diagnosis not present

## 2019-04-02 DIAGNOSIS — I4892 Unspecified atrial flutter: Secondary | ICD-10-CM | POA: Diagnosis present

## 2019-04-02 DIAGNOSIS — Z85118 Personal history of other malignant neoplasm of bronchus and lung: Secondary | ICD-10-CM | POA: Diagnosis not present

## 2019-04-02 DIAGNOSIS — E118 Type 2 diabetes mellitus with unspecified complications: Secondary | ICD-10-CM

## 2019-04-02 DIAGNOSIS — E785 Hyperlipidemia, unspecified: Secondary | ICD-10-CM | POA: Diagnosis not present

## 2019-04-02 DIAGNOSIS — R600 Localized edema: Secondary | ICD-10-CM | POA: Diagnosis present

## 2019-04-02 DIAGNOSIS — Z96652 Presence of left artificial knee joint: Secondary | ICD-10-CM | POA: Diagnosis present

## 2019-04-02 DIAGNOSIS — R000 Tachycardia, unspecified: Secondary | ICD-10-CM

## 2019-04-02 DIAGNOSIS — Z8541 Personal history of malignant neoplasm of cervix uteri: Secondary | ICD-10-CM

## 2019-04-02 DIAGNOSIS — Z20822 Contact with and (suspected) exposure to covid-19: Secondary | ICD-10-CM | POA: Diagnosis not present

## 2019-04-02 DIAGNOSIS — Z79899 Other long term (current) drug therapy: Secondary | ICD-10-CM

## 2019-04-02 DIAGNOSIS — M1811 Unilateral primary osteoarthritis of first carpometacarpal joint, right hand: Secondary | ICD-10-CM | POA: Diagnosis not present

## 2019-04-02 DIAGNOSIS — E1165 Type 2 diabetes mellitus with hyperglycemia: Secondary | ICD-10-CM | POA: Diagnosis present

## 2019-04-02 DIAGNOSIS — S60351A Superficial foreign body of right thumb, initial encounter: Secondary | ICD-10-CM

## 2019-04-02 DIAGNOSIS — L089 Local infection of the skin and subcutaneous tissue, unspecified: Secondary | ICD-10-CM | POA: Diagnosis not present

## 2019-04-02 DIAGNOSIS — Z7984 Long term (current) use of oral hypoglycemic drugs: Secondary | ICD-10-CM

## 2019-04-02 DIAGNOSIS — I482 Chronic atrial fibrillation, unspecified: Secondary | ICD-10-CM | POA: Diagnosis present

## 2019-04-02 DIAGNOSIS — A419 Sepsis, unspecified organism: Secondary | ICD-10-CM

## 2019-04-02 DIAGNOSIS — Z923 Personal history of irradiation: Secondary | ICD-10-CM | POA: Diagnosis not present

## 2019-04-02 DIAGNOSIS — I10 Essential (primary) hypertension: Secondary | ICD-10-CM | POA: Diagnosis present

## 2019-04-02 DIAGNOSIS — M7989 Other specified soft tissue disorders: Secondary | ICD-10-CM | POA: Diagnosis not present

## 2019-04-02 DIAGNOSIS — Z833 Family history of diabetes mellitus: Secondary | ICD-10-CM

## 2019-04-02 DIAGNOSIS — E119 Type 2 diabetes mellitus without complications: Secondary | ICD-10-CM | POA: Diagnosis not present

## 2019-04-02 DIAGNOSIS — L03011 Cellulitis of right finger: Secondary | ICD-10-CM | POA: Diagnosis not present

## 2019-04-02 DIAGNOSIS — L0390 Cellulitis, unspecified: Secondary | ICD-10-CM

## 2019-04-02 DIAGNOSIS — Z7901 Long term (current) use of anticoagulants: Secondary | ICD-10-CM | POA: Diagnosis not present

## 2019-04-02 LAB — PROTIME-INR
INR: 1.1 (ref 0.8–1.2)
Prothrombin Time: 14.2 seconds (ref 11.4–15.2)

## 2019-04-02 LAB — CBC WITH DIFFERENTIAL/PLATELET
Basophils Absolute: 0.1 10*3/uL (ref 0.0–0.1)
Basophils Relative: 0.5 % (ref 0.0–3.0)
Eosinophils Absolute: 0 10*3/uL (ref 0.0–0.7)
Eosinophils Relative: 0.2 % (ref 0.0–5.0)
HCT: 45 % (ref 36.0–46.0)
Hemoglobin: 15.2 g/dL — ABNORMAL HIGH (ref 12.0–15.0)
Lymphocytes Relative: 1.8 % — ABNORMAL LOW (ref 12.0–46.0)
Lymphs Abs: 0.2 10*3/uL — ABNORMAL LOW (ref 0.7–4.0)
MCHC: 33.7 g/dL (ref 30.0–36.0)
MCV: 92.7 fl (ref 78.0–100.0)
Monocytes Absolute: 0.8 10*3/uL (ref 0.1–1.0)
Monocytes Relative: 5.7 % (ref 3.0–12.0)
Neutro Abs: 12.1 10*3/uL — ABNORMAL HIGH (ref 1.4–7.7)
Neutrophils Relative %: 91.8 % — ABNORMAL HIGH (ref 43.0–77.0)
Platelets: 216 10*3/uL (ref 150.0–400.0)
RBC: 4.86 Mil/uL (ref 3.87–5.11)
RDW: 14.8 % (ref 11.5–15.5)
WBC: 13.2 10*3/uL — ABNORMAL HIGH (ref 4.0–10.5)

## 2019-04-02 LAB — BASIC METABOLIC PANEL
BUN: 31 mg/dL — ABNORMAL HIGH (ref 6–23)
CO2: 24 mEq/L (ref 19–32)
Calcium: 9.1 mg/dL (ref 8.4–10.5)
Chloride: 99 mEq/L (ref 96–112)
Creatinine, Ser: 1.05 mg/dL (ref 0.40–1.20)
GFR: 50.9 mL/min — ABNORMAL LOW (ref >60.00)
Glucose, Bld: 302 mg/dL — ABNORMAL HIGH (ref 70–99)
Potassium: 4 mEq/L (ref 3.5–5.1)
Sodium: 131 mEq/L — ABNORMAL LOW (ref 135–145)

## 2019-04-02 LAB — COMPREHENSIVE METABOLIC PANEL
ALT: 24 U/L (ref 0–44)
AST: 15 U/L (ref 15–41)
Albumin: 2.9 g/dL — ABNORMAL LOW (ref 3.5–5.0)
Alkaline Phosphatase: 54 U/L (ref 38–126)
Anion gap: 9 (ref 5–15)
BUN: 36 mg/dL — ABNORMAL HIGH (ref 8–23)
CO2: 24 mmol/L (ref 22–32)
Calcium: 8.6 mg/dL — ABNORMAL LOW (ref 8.9–10.3)
Chloride: 102 mmol/L (ref 98–111)
Creatinine, Ser: 1.17 mg/dL — ABNORMAL HIGH (ref 0.44–1.00)
GFR calc Af Amer: 52 mL/min — ABNORMAL LOW (ref >60)
GFR calc non Af Amer: 45 mL/min — ABNORMAL LOW (ref >60)
Glucose, Bld: 474 mg/dL — ABNORMAL HIGH (ref 70–99)
Potassium: 4.3 mmol/L (ref 3.5–5.1)
Sodium: 135 mmol/L (ref 135–145)
Total Bilirubin: 0.9 mg/dL (ref 0.3–1.2)
Total Protein: 5.9 g/dL — ABNORMAL LOW (ref 6.5–8.1)

## 2019-04-02 LAB — CBG MONITORING, ED: Glucose-Capillary: 323 mg/dL — ABNORMAL HIGH (ref 70–99)

## 2019-04-02 LAB — APTT: aPTT: 29 seconds (ref 24–36)

## 2019-04-02 LAB — LACTIC ACID, PLASMA: Lactic Acid, Venous: 1.9 mmol/L (ref 0.5–1.9)

## 2019-04-02 MED ORDER — CEFAZOLIN SODIUM-DEXTROSE 1-4 GM/50ML-% IV SOLN
1.0000 g | Freq: Once | INTRAVENOUS | Status: AC
  Administered 2019-04-02: 1 g via INTRAVENOUS
  Filled 2019-04-02: qty 50

## 2019-04-02 MED ORDER — APIXABAN 5 MG PO TABS
5.0000 mg | ORAL_TABLET | Freq: Two times a day (BID) | ORAL | Status: DC
  Administered 2019-04-02 – 2019-04-05 (×6): 5 mg via ORAL
  Filled 2019-04-02 (×6): qty 1

## 2019-04-02 MED ORDER — DILTIAZEM HCL ER COATED BEADS 180 MG PO CP24
360.0000 mg | ORAL_CAPSULE | Freq: Every day | ORAL | Status: DC
  Administered 2019-04-03 – 2019-04-05 (×2): 360 mg via ORAL
  Filled 2019-04-02 (×3): qty 2

## 2019-04-02 MED ORDER — IRBESARTAN 150 MG PO TABS
300.0000 mg | ORAL_TABLET | Freq: Every day | ORAL | Status: DC
  Administered 2019-04-03: 300 mg via ORAL
  Filled 2019-04-02 (×2): qty 2

## 2019-04-02 MED ORDER — ONDANSETRON HCL 4 MG/2ML IJ SOLN
4.0000 mg | Freq: Once | INTRAMUSCULAR | Status: DC
  Filled 2019-04-02 (×2): qty 2

## 2019-04-02 MED ORDER — SODIUM CHLORIDE 0.9 % IV BOLUS
1000.0000 mL | Freq: Once | INTRAVENOUS | Status: AC
  Administered 2019-04-02: 1000 mL via INTRAVENOUS

## 2019-04-02 MED ORDER — MORPHINE SULFATE (PF) 2 MG/ML IV SOLN
2.0000 mg | Freq: Once | INTRAVENOUS | Status: DC
  Filled 2019-04-02: qty 1

## 2019-04-02 MED ORDER — INSULIN ASPART 100 UNIT/ML ~~LOC~~ SOLN
0.0000 [IU] | Freq: Every day | SUBCUTANEOUS | Status: DC
  Filled 2019-04-02: qty 0.05

## 2019-04-02 MED ORDER — FUROSEMIDE 20 MG PO TABS: 20.0000 mg | ORAL_TABLET | ORAL | Status: DC

## 2019-04-02 MED ORDER — ROSUVASTATIN CALCIUM 5 MG PO TABS
5.0000 mg | ORAL_TABLET | Freq: Every day | ORAL | Status: DC
  Administered 2019-04-03 – 2019-04-05 (×3): 5 mg via ORAL
  Filled 2019-04-02 (×4): qty 1

## 2019-04-02 MED ORDER — FUROSEMIDE 20 MG PO TABS: ORAL_TABLET | ORAL | 3 refills | Status: DC

## 2019-04-02 MED ORDER — INSULIN ASPART 100 UNIT/ML ~~LOC~~ SOLN
0.0000 [IU] | Freq: Three times a day (TID) | SUBCUTANEOUS | Status: DC
  Administered 2019-04-02: 11 [IU] via SUBCUTANEOUS
  Administered 2019-04-03 (×2): 5 [IU] via SUBCUTANEOUS
  Administered 2019-04-04: 3 [IU] via SUBCUTANEOUS
  Administered 2019-04-05 (×3): 8 [IU] via SUBCUTANEOUS
  Filled 2019-04-02: qty 0.15

## 2019-04-02 MED ORDER — FUROSEMIDE 20 MG PO TABS
20.0000 mg | ORAL_TABLET | Freq: Every day | ORAL | Status: DC
  Administered 2019-04-03 – 2019-04-04 (×2): 20 mg via ORAL
  Filled 2019-04-02 (×2): qty 1

## 2019-04-02 MED ORDER — FUROSEMIDE 20 MG PO TABS: 20.0000 mg | ORAL_TABLET | Freq: Every day | ORAL | Status: DC | PRN

## 2019-04-02 MED ORDER — VANCOMYCIN HCL IN DEXTROSE 1-5 GM/200ML-% IV SOLN
1000.0000 mg | Freq: Once | INTRAVENOUS | Status: DC
  Filled 2019-04-02: qty 200

## 2019-04-02 MED ORDER — SODIUM CHLORIDE 0.9 % IV SOLN
1.0000 g | INTRAVENOUS | Status: DC
  Administered 2019-04-02 – 2019-04-05 (×4): 1 g via INTRAVENOUS
  Filled 2019-04-02: qty 1
  Filled 2019-04-02 (×3): qty 10

## 2019-04-02 MED ORDER — METFORMIN HCL ER 500 MG PO TB24
1000.0000 mg | ORAL_TABLET | Freq: Every day | ORAL | Status: DC
  Administered 2019-04-03: 1000 mg via ORAL
  Filled 2019-04-02: qty 2

## 2019-04-02 MED ORDER — HYDROCORTISONE (PERIANAL) 2.5 % EX CREA
1.0000 "application " | TOPICAL_CREAM | Freq: Three times a day (TID) | CUTANEOUS | Status: DC
  Administered 2019-04-02 – 2019-04-05 (×8): 1 via RECTAL
  Filled 2019-04-02: qty 28.35

## 2019-04-02 MED ORDER — PIPERACILLIN-TAZOBACTAM 3.375 G IVPB 30 MIN
3.3750 g | Freq: Once | INTRAVENOUS | Status: DC
  Filled 2019-04-02: qty 50

## 2019-04-02 NOTE — ED Triage Notes (Signed)
Patient states she was trying to remove a splinter from her right hand . Today, the hand is red and swollen.

## 2019-04-02 NOTE — Patient Instructions (Signed)

## 2019-04-02 NOTE — ED Notes (Signed)
Went to give patient water; she was taking her home medications.

## 2019-04-02 NOTE — ED Notes (Signed)
Patient ambulated unassisted to restroom

## 2019-04-02 NOTE — ED Notes (Signed)
Per PCP-sending patient for hand swelling-states she had a splinter, removed it and now it is swollen and possibly infected

## 2019-04-02 NOTE — ED Provider Notes (Signed)
[facility] DEPT Provider Note   CSN: [id number] Arrival date & time: 04/02/19  1232     History Chief Complaint  Patient presents with  . hand infection    Nicole [name] is a 79 y.o. female.  HPI  Patient is a 79 year old female with a history of atrial fibrillation on Eliquis and diltiazem/metoprolol, also history of diabetes which she states is well controlled with medications   Patient presented today with chief complaint of right thumb pain which he states started 3 days ago.  She states that she found a splinter in her thumb which she is uncertain how she obtained.  She states that she sterilized tweezers and needle and removed the wooden splinter however she states that it began to progressively hurt worse and swelling become red.  She states that she went to her family medicine doctor who sent her to the emergency department.  Patient denies any fevers but does endorse occasional chills over the past 3 days.  She denies any nausea, vomiting, diarrhea.  She states that her legs appear somewhat more swollen than usual however she states that they have been this way for some time.  She usually elevates her legs and takes Lasix as needed for the leg swelling.  She states that she start taking her Lasix 2 days ago and is been peeing frequently since.  She denies any abdominal pain, chest pain, headache or dizziness.     Past Medical History:  Diagnosis Date  . A-fib ([place])   . 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 Atrial flutter ([place]) 10/05/2016  . Bronchitis    hx of  . Cancer [facility]) 2004   uterine/cervical  . Cancer-related pain 10/06/2016  . Coronary artery disease 02/21/2017   JAN 2019 Prox RCA lesion is 25% stenosed. Prox Cx lesion is 30% stenosed. Mid Cx lesion is 25% stenosed. Ost 3rd Mrg lesion is 30% stenosed. Prox LAD lesion is 25% stenosed. Mid LAD lesion is 25% stenosed. The left ventricular systolic function is normal. LV  end diastolic pressure is normal. The left ventricular ejection fraction is 55-65% by visual estimate. There is no aortic valve stenosis.   N  . Diabetes mellitus    type 2  . Edema 07/12/2017  . Essential hypertension 01/20/2009  . Gallstones   . Headache   . History of radiation therapy 08/09/2016 to 09/19/2016   Right lung was treated to 60 Gy in 30 fractions at 2 Gy per fraction  . Hypertension   . Hypertriglyceridemia 08/22/2012  . Low back pain   . Lung mass    with lymphadenopathy  . Osteoarthritis   . Type II diabetes mellitus with manifestations ([place]) 01/20/2009   Estimated Creatinine Clearance: 49.8 mL/min (by C-G formula based on SCr of 1 mg/dL).    Patient Active Problem List   Diagnosis Date Noted  . Cellulitis of hand, right 04/02/2019  . Foreign body of thumb, right 04/02/2019  . Paroxysmal atrial fibrillation ([place]) 02/20/2019  . Secondary hypercoagulable state ([place]) 02/20/2019  . External hemorrhoids 08/16/2018  . Hyperlipidemia with target LDL less than 100 07/18/2018  . Atherosclerosis of aorta ([place]) 07/18/2018  . Spinal stenosis at L4-L5 level 01/25/2018  . Arthritis of sacroiliac joint 01/11/2018  . Edema 07/12/2017  . Coronary artery disease 02/21/2017  . Pneumonitis, radiation ([place]) 01/26/2017  . Encounter for antineoplastic immunotherapy 10/12/2016  . Cancer-related pain 10/06/2016  . Atrial flutter ([place]) 10/05/2016  . Adenocarcinoma of  right lung, stage 3 ([place]) 07/28/2016  . Encounter for antineoplastic chemotherapy 07/28/2016  . Pancreatic cyst 11/09/2015  . Routine general medical examination at a health care facility 06/30/2014  . Gout 01/18/2013  . Hypertriglyceridemia 08/22/2012  . Osteopenia 10/31/2011  . Type II diabetes mellitus with manifestations ([place]) 01/20/2009  . Essential hypertension 01/20/2009  . Osteoarthritis 01/20/2009    Past Surgical History:  Procedure Laterality Date  . ABDOMINAL HYSTERECTOMY  2004  . APPENDECTOMY  when 79  years old  . COLONOSCOPY  08-05-09   [name]  . INCONTINENCE SURGERY    . KNEE ARTHROSCOPY Left   . LEFT HEART CATH AND CORONARY ANGIOGRAPHY N/A 02/24/2017   Procedure: LEFT HEART CATH AND CORONARY ANGIOGRAPHY;  Surgeon: Jettie Booze, MD;  Location: [facility] CV LAB;  Service: Cardiovascular;  Laterality: N/A;  . LEFT HEART CATHETERIZATION WITH CORONARY ANGIOGRAM N/A 07/19/2013   Procedure: LEFT HEART CATHETERIZATION WITH CORONARY ANGIOGRAM;  Surgeon: Sinclair Grooms, MD;  Location: [facility] CATH LAB;  Service: Cardiovascular;  Laterality: N/A;  . POLYPECTOMY  08-05-09   2 polyps  . TOTAL KNEE ARTHROPLASTY Left 03/28/2012   Procedure: LEFT TOTAL KNEE ARTHROPLASTY;  Surgeon: Tobi Bastos, MD;  Location: [facility] ORS;  Service: Orthopedics;  Laterality: Left;  . TUBAL LIGATION    . VIDEO BRONCHOSCOPY WITH ENDOBRONCHIAL ULTRASOUND N/A 07/11/2016   Procedure: VIDEO BRONCHOSCOPY WITH ENDOBRONCHIAL ULTRASOUND;  Surgeon: Marshell Garfinkel, MD;  Location: [facility];  Service: Pulmonary;  Laterality: N/A;     OB History   No obstetric history on file.     Family History  Problem Relation Age of Onset  . Heart attack Father   . Heart disease Father   . Arthritis Other   . Cancer Other        colon, lst degree relative  . Diabetes Other        st degree relative  . Hyperlipidemia Other   . Hypertension Other   . Colon cancer Neg Hx     Social History   Tobacco Use  . Smoking status: Never Smoker  . Smokeless tobacco: Never Used  Substance Use Topics  . Alcohol use: No    Alcohol/week: 0.0 standard drinks  . Drug use: No    Home Medications Prior to Admission medications   Medication Sig Start Date End Date Taking? Authorizing Provider  b complex vitamins tablet Take 1 tablet by mouth daily.   Yes [provider]  calcium-vitamin D (OSCAL WITH D) 500-200 MG-UNIT tablet Take 2 tablets by mouth daily with breakfast.   Yes [provider]  Collagenase POWD by Does not  apply route daily.    Yes [provider]  dapagliflozin propanediol (FARXIGA) 10 MG TABS tablet Take 10 mg by mouth daily. 10/22/18  Yes [name], MD  diltiazem (CARDIZEM CD) 360 MG 24 hr capsule Take 1 capsule (360 mg total) by mouth daily. 07/05/18  Yes Imogene Burn, PA-C  ELIQUIS 5 MG TABS tablet Take 5 mg by mouth 2 (two) times daily. 03/25/19  Yes [provider]  furosemide (LASIX) 20 MG tablet TAKE 1 TABLET DAILY AS NEEDED FOR FLUID. CAN TAKE 2ND TABLET IN AFTERNOON AS NEEDED Patient taking differently: Take 20-40 mg by mouth See admin instructions. TAKE 60m  DAILY AS NEEDED FOR FLUID. CAN TAKE 2ND TABLET IN AFTERNOON AS NEEDED 04/02/19  Yes VJettie Booze MD  hydrocortisone (ANUSOL-HC) 2.5 % rectal cream Place 1 application rectally 3 (three)  times daily. Use for 10 days. 03/29/19  Yes Marrian Salvage, FNP  irbesartan (AVAPRO) 300 MG tablet Take 1 tablet (300 mg total) by mouth daily. 12/12/18  Yes [name], MD  metFORMIN (GLUCOPHAGE-XR) 500 MG 24 hr tablet TAKE 2 TABLETS(1000 MG) BY MOUTH DAILY WITH BREAKFAST Patient taking differently: Take 1,000 mg by mouth daily with breakfast.  05/09/18  Yes Renato Shin, MD  metoprolol tartrate (LOPRESSOR) 25 MG tablet Take 1 tablet every 8 hours AS NEEDED for rapid heart rate Patient taking differently: Take 25 mg by mouth See admin instructions. Take 73m every 8 hours AS NEEDED for rapid heart rate 08/01/18  Yes Fenton, Clint R, PA  Multiple Vitamins-Minerals (MULTIPLE VITAMINS/WOMENS PO) Take by mouth daily.   Yes [provider]  omega-3 acid ethyl esters (LOVAZA) 1 g capsule Take 2 capsules (2 g total) by mouth 2 (two) times daily. 02/05/19  Yes JJanith Lima MD  PREBIOTIC PRODUCT PO Take 1 capsule by mouth daily.   Yes [provider]  rosuvastatin (CRESTOR) 5 MG tablet Take 1 tablet (5 mg total) by mouth daily. 02/05/19  Yes JJanith Lima MD  Turmeric 500 MG CAPS Take 1,000 mg by  mouth daily.   Yes [provider]  CONTOUR NEXT TEST test strip USE 1 STRIP TO CHECK GLUCOSE TWICE DAILY 12/25/18   JJanith Lima MD  Lancets (ACCU-CHEK SOFT TOUCH) lancets Use to check blood sugars daily Dx E11.9 04/20/17   JJanith Lima MD    Allergies    No known allergies  Review of Systems   Review of Systems  Constitutional: Negative for chills and fever.  HENT: Negative for congestion.   Eyes: Negative for pain.  Respiratory: Negative for cough and shortness of breath.   Cardiovascular: Positive for leg swelling (Bilateral chronic). Negative for chest pain.  Gastrointestinal: Negative for abdominal pain and vomiting.  Genitourinary: Negative for dysuria.  Musculoskeletal: Negative for myalgias.  Skin: Positive for wound. Negative for rash.       Right thumb pain and swelling  Neurological: Negative for dizziness and headaches.    Physical Exam Updated Vital Signs BP 121/63   Pulse 98   Temp 98.2 F (36.8 C) (Oral)   Resp (!) 26   Ht _0  (1.727 m)   Wt 73.5 kg   SpO2 95%   BMI 24.63 kg/m   Physical Exam Vitals and nursing note reviewed.  Constitutional:      General: She is not in acute distress. HENT:     Head: Normocephalic and atraumatic.     Nose: Nose normal.     Mouth/Throat:     Mouth: Mucous membranes are dry.  Eyes:     General: No scleral icterus. Cardiovascular:     Rate and Rhythm: Regular rhythm. Tachycardia present.     Pulses: Normal pulses.     Heart sounds: Normal heart sounds.  Pulmonary:     Effort: Pulmonary effort is normal. No respiratory distress.     Breath sounds: No wheezing.  Abdominal:     Palpations: Abdomen is soft.     Tenderness: There is no abdominal tenderness. There is no right CVA tenderness, left CVA tenderness, guarding or rebound.  Musculoskeletal:     Cervical back: Normal range of motion.     Right lower leg: Edema present.     Left lower leg: Edema present.     Comments: Bilateral lower  extremity edema is 2+ to the  midshin.  Patient states that the patient is chronic for her.  Unchanged from several days ago.  Skin:    General: Skin is warm and dry.     Capillary Refill: Capillary refill takes less than 2 seconds.     Comments: Small well-healed wound to the ulnar aspect of the right thumb.  Is well healed and there is no evidence of foreign body.  There is redness and swelling and tenderness to palpation over the hand.  Swelling extends into the dorsum of the hand and to the wrist.  There is lymphangitis spreading proximally up her forearm.  Neurological:     Mental Status: She is alert. Mental status is at baseline.     Comments: Sensation intact all fingertips.  Able to wiggle all fingertips.  Psychiatric:        Mood and Affect: Mood normal.        Behavior: Behavior normal.       ED Results / Procedures / Treatments   Labs (all labs ordered are listed, but only abnormal results are displayed) Labs Reviewed  COMPREHENSIVE METABOLIC PANEL - Abnormal; Notable for the following components:      Result Value   Glucose, Bld 474 (*)    BUN 36 (*)    Creatinine, Ser 1.17 (*)    Calcium 8.6 (*)    Total Protein 5.9 (*)    Albumin 2.9 (*)    GFR calc non Af Amer 45 (*)    GFR calc Af Amer 52 (*)    All other components within normal limits  CULTURE, BLOOD (ROUTINE X 2)  CULTURE, BLOOD (ROUTINE X 2)  SARS CORONAVIRUS 2 (TAT 6-24 HRS)  LACTIC ACID, PLASMA  APTT  PROTIME-INR    EKG None  Radiology DG Hand Complete Right  Result Date: 04/02/2019 CLINICAL DATA:  Cellulitis for 5 days. EXAM: RIGHT HAND - COMPLETE 3+ VIEW COMPARISON:  Contralateral wrist from 2015 FINDINGS: Soft tissue swelling about the hand and wrist. Signs of prior trauma to the ulna. Degenerative changes particularly at first [facility] and about the interphalangeal joints. No signs of destructive bone process. Well-circumscribed lucency in the scaphoid may represent a carpal cyst or intraosseous  lipoma. IMPRESSION: Soft tissue swelling about the hand and wrist. No signs of destructive bone process. If there is concern for osteomyelitis MRI may be helpful. Electronically Signed   By: Zetta Bills M.D.   On: 04/02/2019 10:23    Procedures Procedures (including critical care time) CRITICAL CARE Performed by: Tedd Sias   Total critical care time: 36 minutes  Critical care time was exclusive of separately billable procedures and treating other patients.  Critical care was necessary to treat or prevent imminent or life-threatening deterioration.  Critical care was time spent personally by me on the following activities: development of treatment plan with patient and/or surrogate as well as nursing, discussions with consultants, evaluation of patient's response to treatment, examination of patient, obtaining history from patient or surrogate, ordering and performing treatments and interventions, ordering and review of laboratory studies, ordering and review of radiographic studies, pulse oximetry and re-evaluation of patient's condition. Patient meets criteria for sepsis.  Patient bolused with fluids and given empiric antibiotics.  Sepsis work-up initiated.  Medications Ordered in ED Medications  morphine 2 MG/ML injection 2 mg (2 mg Intravenous Refused 04/02/19 1411)  ondansetron (ZOFRAN) injection 4 mg (4 mg Intravenous Refused 04/02/19 1411)  ceFAZolin (ANCEF) IVPB 1 g/50 mL premix (0 g Intravenous Stopped 04/02/19  1440)  sodium chloride 0.9 % bolus 1,000 mL (0 mLs Intravenous Stopped 04/02/19 1410)    ED Course  I have reviewed the triage vital signs and the nursing notes.  Pertinent labs & imaging results that were available during my care of the patient were reviewed by me and considered in my medical decision making (see chart for details).  Patient has absolutely diabetic patient with right hand wound is progressively worsened over the past 3 days.  States it is red and  swollen and painful and tender to touch.  She states it hurts to move and she is unable to make a fist.  She does have sensation in her thumb and is able to move it.  Doubt septic arthritis.   Physical exam notable for significant swelling of the right hands and lymphedema up towards the forearm.  She has significant swelling on the hand and chronic assess.  Patient has a history of A. fib.  Was noticed to have an irregularly irregular rhythm on physical exam EKG to confirm.  Patient initially tachycardic and mildly hypotensive.  ED sepsis labs ordered.  Clinical Course as of Apr 01 1545  Tue Apr 02, 2019  1404 EKG Atrial fibrillation, rate 98 Nl ST T waves No prior ECG   [JK]    Clinical Course User Index [JK] Dorie Rank, MD   CMP notable for hyperglycemia 474.  Creatinine and BUN trending up slightly from BMP done this morning.  CBC notable for leukocytosis 13.2 baseline WBC for patient is 6.  Leukocytosis is neutrophil predominant -- 92 percent.  Lactic acid within normal limits.  Blood cultures pending.  PT PTT within normal limits.  With patient technically meeting sepsis criteria with mild tachypnea, tachycardia and leukocytosis and suspected source of infection being the right hand patient meets sepsis criteria.  I discussed this case with my attending physician who cosigned this note including patient's presenting symptoms, physical exam, and planned diagnostics and interventions. Attending physician stated agreement with plan or made changes to plan which were implemented.   Attending physician assessed patient at bedside.  Patient given Ancef and IV fluids.  Pulse normalized to less than 100.  Blood pressure within normal limits at this time.  Will admit patient for IV antibiotics and observation.  MDM Rules/Calculators/A&P                      Final Clinical Impression(s) / ED Diagnoses Final diagnoses:  Cellulitis of right hand  Tachycardia  Sepsis, due to unspecified  organism, unspecified whether acute organ dysfunction present [facility])    Rx / [place] Orders ED Discharge Orders    None       Tedd Sias, [place] 04/02/19 1547    Dorie Rank, MD 04/03/19 [phone]

## 2019-04-02 NOTE — Progress Notes (Signed)
Subjective:  Patient ID: Nicole [name], female    DOB: [birth year]  Age: 79 y.o. MRN: [id number]  CC: Hand Pain   This visit occurred during the SARS-CoV-2 public health emergency.  Safety protocols were in place, including screening questions prior to the visit, additional usage of staff PPE, and extensive cleaning of exam room while observing appropriate contact time as indicated for disinfecting solutions.    HPI [name] presents for concerns about there right hand.  She describes having a splinter on the palmar side, base of her thumb about 3 days ago.  She thinks she got all of the splinter out but over the ensuing days she has developed severe pain, redness, streaking up to the wrist, and low-grade fever.  No facility-administered medications prior to visit.   Outpatient Medications Prior to Visit  Medication Sig Dispense Refill  . b complex vitamins tablet Take 1 tablet by mouth daily.    . calcium-vitamin D (OSCAL WITH D) 500-200 MG-UNIT tablet Take 2 tablets by mouth daily with breakfast.    . Collagenase POWD by Does not apply route daily.     . CONTOUR NEXT TEST test strip USE 1 STRIP TO CHECK GLUCOSE TWICE DAILY 200 each 2  . dapagliflozin propanediol (FARXIGA) 10 MG TABS tablet Take 10 mg by mouth daily. 90 tablet 1  . diltiazem (CARDIZEM CD) 360 MG 24 hr capsule Take 1 capsule (360 mg total) by mouth daily. 90 capsule 3  . ELIQUIS 5 MG TABS tablet Take 5 mg by mouth 2 (two) times daily.    . furosemide (LASIX) 20 MG tablet TAKE 1 TABLET DAILY AS NEEDED FOR FLUID. CAN TAKE 2ND TABLET IN AFTERNOON AS NEEDED (Patient taking differently: Take 20-40 mg by mouth See admin instructions. TAKE 20mg   DAILY AS NEEDED FOR FLUID. CAN TAKE 2ND TABLET IN AFTERNOON AS NEEDED) 180 tablet 3  . hydrocortisone (ANUSOL-HC) 2.5 % rectal cream Place 1 application rectally 3 (three) times daily. Use for 10 days. 30 g 1  . irbesartan (AVAPRO) 300 MG tablet Take 1 tablet (300 mg total)  by mouth daily. 90 tablet 1  . Lancets (ACCU-CHEK SOFT TOUCH) lancets Use to check blood sugars daily Dx E11.9 100 each 3  . metFORMIN (GLUCOPHAGE-XR) 500 MG 24 hr tablet TAKE 2 TABLETS(1000 MG) BY MOUTH DAILY WITH BREAKFAST (Patient taking differently: Take 1,000 mg by mouth daily with breakfast. ) 180 tablet 3  . metoprolol tartrate (LOPRESSOR) 25 MG tablet Take 1 tablet every 8 hours AS NEEDED for rapid heart rate (Patient taking differently: Take 25 mg by mouth See admin instructions. Take 25mg  every 8 hours AS NEEDED for rapid heart rate) 30 tablet 1  . Multiple Vitamins-Minerals (MULTIPLE VITAMINS/WOMENS PO) Take by mouth daily.    [name] omega-3 acid ethyl esters (LOVAZA) 1 g capsule Take 2 capsules (2 g total) by mouth 2 (two) times daily. 360 capsule 1  . PREBIOTIC PRODUCT PO Take 1 capsule by mouth daily.    . rosuvastatin (CRESTOR) 5 MG tablet Take 1 tablet (5 mg total) by mouth daily. 90 tablet 1  . Turmeric 500 MG CAPS Take 1,000 mg by mouth daily.      ROS Review of Systems  Constitutional: Positive for fever. Negative for chills and diaphoresis.  HENT: Negative.   Eyes: Negative.   Respiratory: Negative.   Gastrointestinal: Negative for abdominal pain, diarrhea and nausea.  Endocrine: Negative.   Genitourinary: Negative.   Musculoskeletal:  Negative.   Skin: Positive for color change and wound.  Neurological: Negative for dizziness, weakness and light-headedness.  Hematological: Negative for adenopathy. Does not bruise/bleed easily.  Psychiatric/Behavioral: Negative.     Objective:  BP 100/70 (BP Location: Left Arm, Patient Position: Sitting, Cuff Size: Normal)   Pulse 78   Temp 99.4 F (37.4 C) (Oral)   Resp 16   Ht 5\' 7"  (1.702 m)   Wt 162 lb (73.5 kg)   SpO2 99%   BMI 25.37 kg/m   BP Readings from Last 3 Encounters:  04/02/19 (!) 98/56  04/02/19 100/70  04/02/19 90/60    Wt Readings from Last 3 Encounters:  04/02/19 162 lb (73.5 kg)  04/02/19 162 lb (73.5  kg)  04/02/19 162 lb 1.9 oz (73.5 kg)    Physical Exam Constitutional:      General: She is not in acute distress.    Appearance: She is ill-appearing. She is not toxic-appearing or diaphoretic.  HENT:     Mouth/Throat:     Mouth: Mucous membranes are moist.  Eyes:     General: No scleral icterus.    Conjunctiva/sclera: Conjunctivae normal.  Cardiovascular:     Rate and Rhythm: Normal rate and regular rhythm.     Heart sounds: No murmur.  Pulmonary:     Effort: Pulmonary effort is normal.     Breath sounds: No wheezing, rhonchi or rales.  Abdominal:     General: Abdomen is flat. Bowel sounds are normal. There is no distension.     Palpations: Abdomen is soft.     Tenderness: There is no abdominal tenderness.  Musculoskeletal:       Hands:     Cervical back: Neck supple.  Lymphadenopathy:     Cervical: No cervical adenopathy.  Neurological:     Mental Status: She is alert.     Lab Results  Component Value Date   WBC 13.2 (H) 04/02/2019   HGB 15.2 (H) 04/02/2019   HCT 45.0 04/02/2019   PLT 216.0 04/02/2019   GLUCOSE 474 (H) 04/02/2019   CHOL 185 02/05/2019   TRIG 162.0 (H) 02/05/2019   HDL 48.70 02/05/2019   LDLDIRECT 108.0 07/18/2018   LDLCALC 104 (H) 02/05/2019   ALT 24 04/02/2019   AST 15 04/02/2019   NA 135 04/02/2019   K 4.3 04/02/2019   CL 102 04/02/2019   CREATININE 1.17 (H) 04/02/2019   BUN 36 (H) 04/02/2019   CO2 24 04/02/2019   TSH 0.427 08/01/2018   INR 1.1 04/02/2019   HGBA1C 7.5 (H) 02/05/2019   MICROALBUR 1.6 07/18/2018    VAS [place] LOWER EXTREMITY VENOUS (DVT)  Result Date: 03/29/2019  Lower Venous DVTStudy Indications: Swelling.  Comparison Study: no prior Performing Technologist: Abram Sander RVS  Examination Guidelines: A complete evaluation includes B-mode imaging, spectral Doppler, color Doppler, and power Doppler as needed of all accessible portions of each vessel. Bilateral testing is considered an integral part of a complete examination.  Limited examinations for reoccurring indications may be performed as noted. The reflux portion of the exam is performed with the patient in reverse Trendelenburg.  +-----+---------------+---------+-----------+----------+--------------+ RIGHTCompressibilityPhasicitySpontaneityPropertiesThrombus Aging +-----+---------------+---------+-----------+----------+--------------+ CFV  Full           Yes      Yes                                 +-----+---------------+---------+-----------+----------+--------------+   +---------+---------------+---------+-----------+----------+--------------+ LEFT  CompressibilityPhasicitySpontaneityPropertiesThrombus Aging +---------+---------------+---------+-----------+----------+--------------+ CFV      Full           Yes      Yes                                 +---------+---------------+---------+-----------+----------+--------------+ SFJ      Full                                                        +---------+---------------+---------+-----------+----------+--------------+ FV Prox  Full                                                        +---------+---------------+---------+-----------+----------+--------------+ FV Mid   Full                                                        +---------+---------------+---------+-----------+----------+--------------+ FV DistalFull                                                        +---------+---------------+---------+-----------+----------+--------------+ PFV      Full                                                        +---------+---------------+---------+-----------+----------+--------------+ POP      Full           Yes      Yes                                 +---------+---------------+---------+-----------+----------+--------------+ PTV      Full                                                         +---------+---------------+---------+-----------+----------+--------------+ PERO                                                  Not visualized +---------+---------------+---------+-----------+----------+--------------+     Summary: RIGHT: - No evidence of common femoral vein obstruction.  LEFT: - There is no evidence of deep vein thrombosis in the lower extremity.  - No cystic structure found in the popliteal fossa.  *See table(s) above for measurements and observations. Electronically signed by Monica Martinez MD on 03/29/2019 at 4:44:34 PM.  Final    DG Hand Complete Right  Result Date: 04/02/2019 CLINICAL DATA:  Cellulitis for 5 days. EXAM: RIGHT HAND - COMPLETE 3+ VIEW COMPARISON:  Contralateral wrist from 2015 FINDINGS: Soft tissue swelling about the hand and wrist. Signs of prior trauma to the ulna. Degenerative changes particularly at first [facility] and about the interphalangeal joints. No signs of destructive bone process. Well-circumscribed lucency in the scaphoid may represent a carpal cyst or intraosseous lipoma. IMPRESSION: Soft tissue swelling about the hand and wrist. No signs of destructive bone process. If there is concern for osteomyelitis MRI may be helpful. Electronically Signed   By: Zetta Bills M.D.   On: 04/02/2019 10:23    Assessment & Plan:   Daphnee was seen today for hand pain.  Diagnoses and all orders for this visit:  Cellulitis of hand, right- She has cellulitis in her right hand after sustaining a splinter injury.  She has an elevated white cell count and the plain film shows soft tissue swelling but there is no evidence of a foreign body.  Her blood pressure is soft and she has a low-grade fever.  I am concerned she is preseptic.  I recommended that she go to the ED to get urgent IV antibiotics, fluids, and assessment by hand surgery to see if she needs to undergo an incision and drainage. -     DG Hand Complete Right; Future -     CBC with Differential/Platelet;  Future -     Basic metabolic panel; Future -     Basic metabolic panel -     CBC with Differential/Platelet  Foreign body of right thumb, initial encounter -     DG Hand Complete Right; Future   I am having Nicole [name] maintain her accu-chek soft touch, metFORMIN, diltiazem, metoprolol tartrate, Multiple Vitamins-Minerals (MULTIPLE VITAMINS/WOMENS PO), Turmeric, b complex vitamins, calcium-vitamin D, PREBIOTIC PRODUCT PO, Collagenase, Farxiga, irbesartan, Contour Next Test, rosuvastatin, omega-3 acid ethyl esters, Eliquis, hydrocortisone, and furosemide.  No orders of the defined types were placed in this encounter.    Follow-up: No follow-ups on file.  Scarlette Calico, MD

## 2019-04-02 NOTE — H&P (Signed)
History and Physical    Nicole [name] PPJ:[id number] DOB: February 14, 1940 DOA: 04/02/2019  PCP: [name], MD   Patient coming from: Home.  I have personally briefly reviewed patient's old medical records in [place]  Chief Complaint: Hand infection.  HPI: Nicole [name] is a 79 y.o. female with medical history significant of chronic atrial fibrillation, a flutter, adenocarcinoma right lung, history of bronchitis, history of cervical cancer, nonobstructive CAD, type 2 diabetes, essential hypertension, history of gallstones, headaches, history of radiation therapy for right lung mass, hyperlipidemia, chronic lower back pain who is coming to the emergency department due to hand swelling, tenderness and redness for the past 3 days associated with an episode of fever today.  She has been having chills for the past few days as well.  She states that she had a splinter in her right thumb, which she does not remember how she acquired it.  She removed that with some sterilized tweezers and needle, but states that after this she started developing pain, erythema and edema on the hand.  She called Dr. Scarlette Calico, her primary care doctor, who recommended for her to come to the emergency department.  She also endorses lower extremity edema for which she has been taking furosemide over the past 2 days.  She denies headache, sore throat or rhinorrhea.  No dyspnea, wheezing or hemoptysis.  Denies chest pain, palpitations, dizziness, diaphoresis, orthopnea or PND.  She denies abdominal pain, nausea, emesis, diarrhea, constipation, melena or hematochezia.  No dysuria, frequency or hematuria.  Denies polyuria, polydipsia, polyphagia or blurred vision, but states that her blood glucose had not been well controlled recently.  ED Course: Initial vital signs were temperature 97.8 F, pulse 111, respirations 16, blood pressure 94/56 mmHg and O2 sat 95% on room air.  She received Ancef in the emergency  department.  CBC shows a white count of 13.2 with 92% neutrophils, hemoglobin 15.2 g/dL and platelets 216.  PT/INR/PTT are within normal limits.  Lactic acid was normal.  CMP shows normal electrolytes when calcium is corrected to albumin.  Glucose of 474, BUN 36 and creatinine 1.17 mg/dL.  Total protein was 5.9 and albumin 2.9 g/dL.  The rest of the hepatic functions are within expected range.  Review of Systems: As per HPI otherwise 10 point review of systems negative.   Past Medical History:  Diagnosis Date  . A-fib ([place])   . Adenocarcinoma of right lung, stage 3 ([place]) 07/28/2016  . Atrial flutter ([place]) 10/05/2016  . Bronchitis    hx of  . Cancer [facility]) 2004   uterine/cervical  . Cancer-related pain 10/06/2016  . Coronary artery disease 02/21/2017   JAN 2019 Prox RCA lesion is 25% stenosed. Prox Cx lesion is 30% stenosed. Mid Cx lesion is 25% stenosed. Ost 3rd Mrg lesion is 30% stenosed. Prox LAD lesion is 25% stenosed. Mid LAD lesion is 25% stenosed. The left ventricular systolic function is normal. LV end diastolic pressure is normal. The left ventricular ejection fraction is 55-65% by visual estimate. There is no aortic valve stenosis.   N  . Diabetes mellitus    type 2  . Edema 07/12/2017  . Essential hypertension 01/20/2009  . Gallstones   . Headache   . History of radiation therapy 08/09/2016 to 09/19/2016   Right lung was treated to 60 Gy in 30 fractions at 2 Gy per fraction  . Hypertension   . Hypertriglyceridemia 08/22/2012  . Low back pain   . Lung  mass    with lymphadenopathy  . Osteoarthritis   . Type II diabetes mellitus with manifestations ([place]) 01/20/2009   Estimated Creatinine Clearance: 49.8 mL/min (by C-G formula based on SCr of 1 mg/dL).    Past Surgical History:  Procedure Laterality Date  . ABDOMINAL HYSTERECTOMY  2004  . APPENDECTOMY  when 79 years old  . COLONOSCOPY  08-05-09   [name]  . INCONTINENCE SURGERY    . KNEE ARTHROSCOPY Left   . LEFT HEART CATH  AND CORONARY ANGIOGRAPHY N/A 02/24/2017   Procedure: LEFT HEART CATH AND CORONARY ANGIOGRAPHY;  Surgeon: Jettie Booze, MD;  Location: [facility];  Service: Cardiovascular;  Laterality: N/A;  . LEFT HEART CATHETERIZATION WITH CORONARY ANGIOGRAM N/A 07/19/2013   Procedure: LEFT HEART CATHETERIZATION WITH CORONARY ANGIOGRAM;  Surgeon: Sinclair Grooms, MD;  Location[facility];  Service: Cardiovascular;  Laterality: N/A;  . POLYPECTOMY  08-05-09   2 polyps  . TOTAL KNEE ARTHROPLASTY Left 03/28/2012   Procedure: LEFT TOTAL KNEE ARTHROPLASTY;  Surgeon: Tobi Bastos, MD;  Location: [facility];  Service: Orthopedics;  Laterality: Left;  . TUBAL LIGATION    . VIDEO BRONCHOSCOPY WITH ENDOBRONCHIAL ULTRASOUND N/A 07/11/2016   Procedure: VIDEO BRONCHOSCOPY WITH ENDOBRONCHIAL ULTRASOUND;  Surgeon: Marshell Garfinkel, MD;  Location: [facility];  Service: Pulmonary;  Laterality: N/A;     reports that she has never smoked. She has never used smokeless tobacco. She reports that she does not drink alcohol or use drugs.  Allergies  Allergen Reactions  . No Known Allergies     Family History  Problem Relation Age of Onset  . Heart attack Father   . Heart disease Father   . Arthritis Other   . Cancer Other        colon, lst degree relative  . Diabetes Other        st degree relative  . Hyperlipidemia Other   . Hypertension Other   . Colon cancer Neg Hx    Prior to Admission medications   Medication Sig Start Date End Date Taking? Authorizing Provider  b complex vitamins tablet Take 1 tablet by mouth daily.   Yes [provider]  calcium-vitamin D (OSCAL WITH D) 500-200 MG-UNIT tablet Take 2 tablets by mouth daily with breakfast.   Yes [provider]  Collagenase POWD by Does not apply route daily.    Yes [provider]  dapagliflozin propanediol (FARXIGA) 10 MG TABS tablet Take 10 mg by mouth daily. 10/22/18  Yes [name], MD  diltiazem (CARDIZEM CD) 360 MG  24 hr capsule Take 1 capsule (360 mg total) by mouth daily. 07/05/18  Yes Imogene Burn, PA-C  ELIQUIS 5 MG TABS tablet Take 5 mg by mouth 2 (two) times daily. 03/25/19  Yes [provider]  furosemide (LASIX) 20 MG tablet TAKE 1 TABLET DAILY AS NEEDED FOR FLUID. CAN TAKE 2ND TABLET IN AFTERNOON AS NEEDED Patient taking differently: Take 20-40 mg by mouth See admin instructions. TAKE 87m  DAILY AS NEEDED FOR FLUID. CAN TAKE 2ND TABLET IN AFTERNOON AS NEEDED 04/02/19  Yes VJettie Booze MD  hydrocortisone (ANUSOL-HC) 2.5 % rectal cream Place 1 application rectally 3 (three) times daily. Use for 10 days. 03/29/19  Yes MMarrian Salvage FNP  irbesartan (AVAPRO) 300 MG tablet Take 1 tablet (300 mg total) by mouth daily. 12/12/18  Yes JJanith Lima MD  metFORMIN (GLUCOPHAGE-XR) 500 MG 24 hr tablet TAKE 2 TABLETS(1000  MG) BY MOUTH DAILY WITH BREAKFAST Patient taking differently: Take 1,000 mg by mouth daily with breakfast.  05/09/18  Yes Renato Shin, MD  metoprolol tartrate (LOPRESSOR) 25 MG tablet Take 1 tablet every 8 hours AS NEEDED for rapid heart rate Patient taking differently: Take 25 mg by mouth See admin instructions. Take 70m every 8 hours AS NEEDED for rapid heart rate 08/01/18  Yes Fenton, Clint R, PA  Multiple Vitamins-Minerals (MULTIPLE VITAMINS/WOMENS PO) Take by mouth daily.   Yes [provider]  omega-3 acid ethyl esters (LOVAZA) 1 g capsule Take 2 capsules (2 g total) by mouth 2 (two) times daily. 02/05/19  Yes JJanith Lima MD  PREBIOTIC PRODUCT PO Take 1 capsule by mouth daily.   Yes [provider]  rosuvastatin (CRESTOR) 5 MG tablet Take 1 tablet (5 mg total) by mouth daily. 02/05/19  Yes JJanith Lima MD  Turmeric 500 MG CAPS Take 1,000 mg by mouth daily.   Yes [provider]  CONTOUR NEXT TEST test strip USE 1 STRIP TO CHECK GLUCOSE TWICE DAILY 12/25/18   JJanith Lima MD  Lancets ([facility]K [facility] lancets Use to check  blood sugars daily Dx E11.9 04/20/17   JJanith Lima MD    Physical Exam: Vitals:   04/02/19 1245 04/02/19 1327 04/02/19 1430 04/02/19 1515  BP:  (!) 93/54 (!) 98/56 121/63  Pulse:  100 83 98  Resp:  20 15 (!) 26  Temp:  98.2 F (36.8 C)    TempSrc:  Oral    SpO2:  94% 95% 95%  Weight: 73.5 kg 73.5 kg    Height: _0  (1.702 m) _1  (1.727 m)      Constitutional: NAD, calm, comfortable Eyes: PERRL, lids and conjunctivae normal ENMT: Mucous membranes are moist. Posterior pharynx clear of any exudate or lesions. Neck: normal, supple, no masses, no thyromegaly Respiratory: Clear to auscultation bilaterally, no wheezing, no crackles. Normal respiratory effort. No accessory muscle use.  Cardiovascular: Irregularly irregular, no murmurs / rubs / gallops.  Bilateral 1+ lower extremities pitting edema. 2+ pedal pulses. No carotid bruits.  Abdomen: Soft, no tenderness, no masses palpated. No hepatosplenomegaly. Bowel sounds positive.  Musculoskeletal: no clubbing / cyanosis. Good ROM, no contractures. Normal muscle tone.  Skin: Positive erythema, calor, edema and TTP of right hand.  See pictures below. Neurologic: CN 2-12 grossly intact. Sensation intact, DTR normal. Strength 5/5 in all 4.  Psychiatric: Normal judgment and insight. Alert and oriented x 3. Normal mood.          Labs on Admission: I have personally reviewed following labs and imaging studies  CBC: Recent Labs  Lab 04/02/19 1002  WBC 13.2*  NEUTROABS 12.1*  HGB 15.2*  HCT 45.0  MCV 92.7  PLT 2630.1  Basic Metabolic Panel: Recent Labs  Lab 04/02/19 1002 04/02/19 1313  NA 131* 135  K 4.0 4.3  CL 99 102  CO2 24 24  GLUCOSE 302* 474*  BUN 31* 36*  CREATININE 1.05 1.17*  CALCIUM 9.1 8.6*   GFR: Estimated Creatinine Clearance: 41.3 mL/min (A) (by C-G formula based on SCr of 1.17 mg/dL (H)). Liver Function Tests: Recent Labs  Lab 04/02/19 1313  AST 15  ALT 24  ALKPHOS 54  BILITOT 0.9  PROT  5.9*  ALBUMIN 2.9*   No results for input(s): LIPASE, AMYLASE in the last 168 hours. No results for input(s): AMMONIA in the last 168 hours. Coagulation Profile: Recent Labs  Lab  04/02/19 1313  INR 1.1   Cardiac Enzymes: No results for input(s): CKTOTAL, CKMB, CKMBINDEX, TROPONINI in the last 168 hours. BNP (last 3 results) No results for input(s): PROBNP in the last 8760 hours. HbA1C: No results for input(s): HGBA1C in the last 72 hours. CBG: Recent Labs  Lab 04/02/19 1729  GLUCAP 323*   Lipid Profile: No results for input(s): CHOL, HDL, LDLCALC, TRIG, CHOLHDL, LDLDIRECT in the last 72 hours. Thyroid Function Tests: No results for input(s): TSH, T4TOTAL, FREET4, T3FREE, THYROIDAB in the last 72 hours. Anemia Panel: No results for input(s): VITAMINB12, FOLATE, FERRITIN, TIBC, IRON, RETICCTPCT in the last 72 hours. Urine analysis:    Component Value Date/Time   COLORURINE YELLOW 07/29/2018 1445   APPEARANCEUR HAZY (A) 07/29/2018 1445   LABSPEC 1.010 07/29/2018 1445   PHURINE 7.0 07/29/2018 1445   GLUCOSEU >=500 (A) 07/29/2018 1445   GLUCOSEU NEGATIVE 07/18/2018 1549   HGBUR NEGATIVE 07/29/2018 1445   HGBUR large 12/16/2009 1024   BILIRUBINUR NEGATIVE 07/29/2018 1445   BILIRUBINUR negative 11/15/2017 1041   KETONESUR NEGATIVE 07/29/2018 1445   PROTEINUR NEGATIVE 07/29/2018 1445   UROBILINOGEN 0.2 07/18/2018 1549   NITRITE NEGATIVE 07/29/2018 1445   LEUKOCYTESUR NEGATIVE 07/29/2018 1445    Radiological Exams on Admission: DG Hand Complete Right  Result Date: 04/02/2019 CLINICAL DATA:  Cellulitis for 5 days. EXAM: RIGHT HAND - COMPLETE 3+ VIEW COMPARISON:  Contralateral wrist from 2015 FINDINGS: Soft tissue swelling about the hand and wrist. Signs of prior trauma to the ulna. Degenerative changes particularly at first [facility] and about the interphalangeal joints. No signs of destructive bone process. Well-circumscribed lucency in the scaphoid may represent a carpal cyst or  intraosseous lipoma. IMPRESSION: Soft tissue swelling about the hand and wrist. No signs of destructive bone process. If there is concern for osteomyelitis MRI may be helpful. Electronically Signed   By: Zetta Bills M.D.   On: 04/02/2019 10:23    EKG: Independently reviewed. Vent. rate 98 BPM PR interval * ms QRS duration 102 ms QT/QTc 341/415 ms P-R-T axes * 36 28 Atrial fibrillation Abnormal R-wave progression, early transition  Assessment/Plan Principal Problem:   Cellulitis of right hand Admit to telemetry/inpatient. Analgesics as needed. Ceftriaxone 1 g IVPB daily. Consult hand surgery no improvement.  Active Problems:   Essential hypertension Continue Cardizem 360 mg p.o. daily. She is on as needed metoprolol and furosemide. Monitor BP, heart rate, renal function electrolytes.    Coronary artery disease Continue apixaban, metoprolol and Crestor.    Hyperlipidemia with target LDL less than 100   Hypertriglyceridemia On Crestor.    Paroxysmal atrial fibrillation (HCC) CHA?DS?-VASc Score of at least 6. On apixaban, Cardizem and as needed metoprolol.   DVT prophylaxis: On Eliquis. Code Status: Full code. Family Communication: Disposition Plan: Admit for IV antibiotic therapy for 2 to 3 days. Consults called: Admission status: Inpatient/telemetry.   Reubin Milan MD [facility]  If 7PM-7AM, please contact night-coverage [email or web address]  04/02/2019, 5:56 PM   This document was prepared using Dragon voice recognition software and may contain some unintended transcription errors.

## 2019-04-02 NOTE — ED Notes (Signed)
Patient given Ginger Ale and [place] sandwich

## 2019-04-02 NOTE — Patient Instructions (Signed)
Medication Instructions:  Your physician recommends that you continue on your current medications as directed. Please refer to the Current Medication list given to you today.  *If you need a refill on your cardiac medications before your next appointment, please call your pharmacy*   Lab Work: None ordered  If you have labs (blood work) drawn today and your tests are completely normal, you will receive your results only by: [name] MyChart Message (if you have MyChart) OR . A paper copy in the mail If you have any lab test that is abnormal or we need to change your treatment, we will call you to review the results.   Testing/Procedures: None ordered   Follow-Up: At [facility], you and your health needs are our priority.  As part of our continuing mission to provide you with exceptional heart care, we have created designated Provider Care Teams.  These Care Teams include your primary Cardiologist (physician) and Advanced Practice Providers (APPs -  Physician Assistants and Nurse Practitioners) who all work together to provide you with the care you need, when you need it.  We recommend signing up for the patient portal called "MyChart".  Sign up information is provided on this After Visit Summary.  MyChart is used to connect with patients for Virtual Visits (Telemedicine).  Patients are able to view lab/test results, encounter notes, upcoming appointments, etc.  Non-urgent messages can be sent to your provider as well.   To learn more about what you can do with MyChart, go to [email or web address].    Your next appointment:   6 month(s)  The format for your next appointment:   In Person  Provider:   You may see Larae Grooms, MD or one of the following Advanced Practice Providers on your designated Care Team:    Melina Copa, PA-C  Ermalinda Barrios, PA-C    Other Instructions

## 2019-04-02 NOTE — ED Notes (Signed)
ED Provider at bedside. 

## 2019-04-02 NOTE — Progress Notes (Signed)
Pharmacy Antibiotic Note  Nicole [name] is a 79 y.o. female admitted on 04/02/2019 with cellulitis. Pain started 3 days ago after she removed a splinter from her thumb. Pharmacy has been consulted for vancomycin and zosyn dosing.  Plan: Vancomycin 1gm IV x 1 in ED then 1gm q24h (AUC 488.9, Scr 1.17) Zosyn 3.375g over 30 min in ED Then Zosyn 3.375g IV Q8H infused over 4hrs. Daily Scr while on vanc and zosyn Follow renal function, cultures and clinical course vanc levels as needed   Height: 5\' 8"  (172.7 cm) Weight: 162 lb (73.5 kg) IBW/kg (Calculated) : 63.9  Temp (24hrs), Avg:98.5 F (36.9 C), Min:97.8 F (36.6 C), Max:99.4 F (37.4 C)  Recent Labs  Lab 04/02/19 1002 04/02/19 1002 04/02/19 1313  WBC  --  13.2*  --   CREATININE 1.05  --  1.17*  LATICACIDVEN  --   --  1.9    Estimated Creatinine Clearance: 41.3 mL/min (A) (by C-G formula based on SCr of 1.17 mg/dL (H)).    Allergies  Allergen Reactions  . No Known Allergies     Antimicrobials this admission: 3/9 cefazolin x1 3/9 vanc >> 3/9 zosyn >> Dose adjustments this admission:   Microbiology results: 3/9 BCx:   Thank you for allowing pharmacy to be a part of this patient's care.  Dolly Rias RPh 04/02/2019, 4:07 PM

## 2019-04-03 ENCOUNTER — Other Ambulatory Visit: Payer: Self-pay | Admitting: *Deleted

## 2019-04-03 ENCOUNTER — Inpatient Hospital Stay (HOSPITAL_COMMUNITY): Payer: Medicare Other

## 2019-04-03 DIAGNOSIS — I10 Essential (primary) hypertension: Secondary | ICD-10-CM

## 2019-04-03 DIAGNOSIS — E785 Hyperlipidemia, unspecified: Secondary | ICD-10-CM

## 2019-04-03 DIAGNOSIS — L03113 Cellulitis of right upper limb: Secondary | ICD-10-CM

## 2019-04-03 DIAGNOSIS — I2510 Atherosclerotic heart disease of native coronary artery without angina pectoris: Secondary | ICD-10-CM

## 2019-04-03 LAB — GLUCOSE, CAPILLARY
Glucose-Capillary: 121 mg/dL — ABNORMAL HIGH (ref 70–99)
Glucose-Capillary: 109 mg/dL — ABNORMAL HIGH (ref 70–99)
Glucose-Capillary: 221 mg/dL — ABNORMAL HIGH (ref 70–99)
Glucose-Capillary: 239 mg/dL — ABNORMAL HIGH (ref 70–99)
Glucose-Capillary: 165 mg/dL — ABNORMAL HIGH (ref 70–99)

## 2019-04-03 LAB — SARS CORONAVIRUS 2 (TAT 6-24 HRS): SARS Coronavirus 2: NEGATIVE

## 2019-04-03 MED ORDER — HYDROCODONE-ACETAMINOPHEN 5-325 MG PO TABS
1.0000 | ORAL_TABLET | ORAL | Status: DC | PRN
  Administered 2019-04-03 – 2019-04-04 (×5): 1 via ORAL
  Filled 2019-04-03 (×5): qty 1

## 2019-04-03 MED ORDER — TRAMADOL HCL 50 MG PO TABS
50.0000 mg | ORAL_TABLET | Freq: Two times a day (BID) | ORAL | Status: DC | PRN
  Administered 2019-04-03: 50 mg via ORAL
  Filled 2019-04-03 (×2): qty 1

## 2019-04-03 MED ORDER — INSULIN ASPART 100 UNIT/ML ~~LOC~~ SOLN
3.0000 [IU] | Freq: Three times a day (TID) | SUBCUTANEOUS | Status: DC
  Administered 2019-04-03 – 2019-04-05 (×5): 3 [IU] via SUBCUTANEOUS

## 2019-04-03 MED ORDER — FENTANYL CITRATE (PF) 100 MCG/2ML IJ SOLN
12.5000 ug | Freq: Once | INTRAMUSCULAR | Status: AC
  Administered 2019-04-03: 12.5 ug via INTRAVENOUS
  Filled 2019-04-03: qty 2

## 2019-04-03 MED ORDER — HYDROCODONE-ACETAMINOPHEN 5-325 MG PO TABS
1.0000 | ORAL_TABLET | ORAL | Status: AC | PRN
  Administered 2019-04-03: 1 via ORAL
  Filled 2019-04-03: qty 1

## 2019-04-03 NOTE — Progress Notes (Addendum)
[facility]                                                                              Patient Demographics  Nicole [name], is a 79 y.o. female, DOB - 05-27-1940, ERX:[id number]  Admit date - 04/02/2019   Admitting Physician Reubin Milan, MD  Outpatient Primary MD for the patient is [name], MD  Outpatient specialists:   LOS - 1  days   Medical records reviewed and are as summarized below:    Chief Complaint  Patient presents with  . hand infection       Brief summary   Patient is a 79 year old female with a history of chronic atrial fibrillation, adeno CA right lung, history of bronchitis, history of cervical CA, nonobstructive CAD, type 2 diabetes mellitus, hypertension presented with right hand swelling tenderness and redness for the past 3 days with an episode of fever on the day of admission.  She also reported chills for few days.  Reported she had a splinter in her right thumb, did not remember how she got it, removed it with some sterilized tweezers and needle but after that she started whelping pain, erythema and edema on the hand.  She called her PCP, Dr. Wynetta Emery who recommended her to go to ED.  She also reported lower extremity edema and had been taking furosemide for the past 2 days. In ED, temp 97.8, pulse 111, white count 13.2, BP 94/56  Assessment & Plan    Principal Problem: Sepsis secondary to cellulitis of right hand -Patient met sepsis criteria at the time of admission due to tachycardia, leukocytosis, hypotension, source likely due to cellulitis of the right hand -Continue pain control, IV Rocephin, elevate right hand -Discussed with hand surgery, recommended localized ultrasound.  Soft tissue ultrasound right hand showed no focal abscess.   Active Problems:  Diabetes mellitus type 2, uncontrolled with hyperglycemia -Hold outpatient oral hypoglycemics, Placed on sliding scale insulin, moderate, added NovoLog meal  coverage Hemoglobin A1c 7.5 on 02/04/2018, will repeat    Essential hypertension -BP somewhat soft, for now continue Cardizem, furosemide -Hold Avapro    Hypertriglyceridemia -Continue Crestor    Coronary artery disease -Continue apixaban, Crestor    Paroxysmal atrial fibrillation (HCC) Continue Cardizem, as needed metoprolol Continue apixaban  Code Status: Full CODE STATUS DVT Prophylaxis: Apixaban Family Communication: Discussed all imaging results, lab results, explained to the patient   Disposition Plan: Patient from home, anticipated discharge home once right hand swelling, pain and erythema is improving.  Currently quite swollen and requiring IV antibiotics, anticipate improvement in next 24 to 48 hours    Time Spent in minutes   35 mins   Procedures:  Localized ultrasounds  Consultants:     Antimicrobials:   Anti-infectives (From admission, onward)   Start     Dose/Rate Route Frequency Ordered Stop   04/02/19 1645  cefTRIAXone (ROCEPHIN) 1 g in sodium chloride 0.9 % 100 mL IVPB     1 g 200 mL/hr over 30 Minutes Intravenous Every 24 hours 04/02/19 1631     04/02/19 1615  piperacillin-tazobactam (ZOSYN) IVPB 3.375 g  Status:  Discontinued     3.375 g 100 mL/hr over 30 Minutes Intravenous  Once 04/02/19 1557 04/02/19 1631   04/02/19 1615  vancomycin (VANCOCIN) IVPB 1000 mg/200 mL premix  Status:  Discontinued     1,000 mg 200 mL/hr over 60 Minutes Intravenous  Once 04/02/19 1557 04/02/19 1631   04/02/19 1330  ceFAZolin (ANCEF) IVPB 1 g/50 mL premix     1 g 100 mL/hr over 30 Minutes Intravenous  Once 04/02/19 1319 04/02/19 1440          Medications  Scheduled Meds: . apixaban  5 mg Oral BID  . diltiazem  360 mg Oral Daily  . furosemide  20 mg Oral Daily  . hydrocortisone  1 application Rectal TID  . insulin aspart  0-15 Units Subcutaneous TID WC  . insulin aspart  0-5 Units Subcutaneous QHS  . irbesartan  300 mg Oral Daily  . metFORMIN  1,000 mg Oral  Q breakfast  .  morphine injection  2 mg Intravenous Once  . ondansetron (ZOFRAN) IV  4 mg Intravenous Once  . rosuvastatin  5 mg Oral Daily   Continuous Infusions: . cefTRIAXone (ROCEPHIN)  IV Stopped (04/02/19 1822)   PRN Meds:.furosemide, traMADol      Subjective:   Nicole [name] was seen and examined today.  Still has significant pain in the right hand, swelling and redness, not able to make a fist.  No acute fevers overnight. Patient denies dizziness, chest pain, shortness of breath, abdominal pain, N/V/D/C, new weakness, numbess, tingling. No acute events overnight.    Objective:   Vitals:   04/02/19 1515 04/02/19 1948 04/02/19 2041 04/03/19 1423  BP: 121/63 125/69 106/63 (!) 81/48  Pulse: 98 87 61 (!) 56  Resp: (!) _0 Temp:   98 F (36.7 C) 98.2 F (36.8 C)  TempSrc:    Oral  SpO2: 95% 98% 94% 96%  Weight:      Height:        Intake/Output Summary (Last 24 hours) at 04/03/2019 1424 Last data filed at 04/03/2019 1000 Gross per 24 hour  Intake 460 ml  Output 225 ml  Net 235 ml     Wt Readings from Last 3 Encounters:  04/02/19 73.5 kg  04/02/19 73.5 kg  04/02/19 73.5 kg     Exam  General: Alert and oriented x 3, NAD  Cardiovascular: S1 S2 auscultated, no murmurs, RRR  Respiratory: Clear to auscultation bilaterally, no wheezing, rales or rhonchi  Gastrointestinal: Soft, nontender, nondistended, + bowel sounds  Ext: no pedal edema bilaterally  Neuro: No new deficits  Musculoskeletal: No digital cyanosis, clubbing  Skin: See picture below, right hand cellulitis  Psych: Normal affect and demeanor, alert and oriented x3    RIGHT HAND SWELLING,CELLULITIS          Data Reviewed:  I have personally reviewed following labs and imaging studies  Micro Results Recent Results (from the past 240 hour(s))  Blood Culture (routine x 2)     Status: None (Preliminary result)   Collection Time: 04/02/19  1:18 PM   Specimen: BLOOD LEFT  FOREARM  Result Value Ref Range Status   Specimen Description   Final    BLOOD LEFT FOREARM Performed at [facility], [place] [address], [place], [place] [address]    Special Requests   Final    BOTTLES DRAWN AEROBIC AND ANAEROBIC Blood Culture adequate volume Performed at [facility] [address] [place], [place]  [address]    Culture   Final    NO GROWTH < 24 HOURS Performed at [facility], [place] [address], [place], [place] [address]    Report Status PENDING  Incomplete  Blood Culture (routine x 2)     Status: None (Preliminary result)   Collection Time: 04/02/19  2:08 PM   Specimen: BLOOD  Result Value Ref Range Status   Specimen Description   Final    BLOOD RIGHT ANTECUBITAL Performed at [facility] [address] [place], [place] [address]    Special Requests   Final    BOTTLES DRAWN AEROBIC AND ANAEROBIC Blood Culture adequate volume Performed at [facility] [address] [place], [place] [address]    Culture   Final    NO GROWTH < 24 HOURS Performed at [facility] [address] [place], [place] [address]    Report Status PENDING  Incomplete  SARS CORONAVIRUS 2 (TAT 6-24 HRS) Nasopharyngeal Nasopharyngeal Swab     Status: None   Collection Time: 04/02/19  4:29 PM   Specimen: Nasopharyngeal Swab  Result Value Ref Range Status   SARS Coronavirus 2 NEGATIVE NEGATIVE Final    Comment: (NOTE) SARS-CoV-2 target nucleic acids are NOT DETECTED. The SARS-CoV-2 RNA is generally detectable in upper and lower respiratory specimens during the acute phase of infection. Negative results do not preclude SARS-CoV-2 infection, do not rule out co-infections with other pathogens, and should not be used as the sole basis for treatment or other patient management decisions. Negative results must be combined with clinical observations, patient history, and epidemiological information. The  expected result is Negative. Fact Sheet for Patients: SugarRoll.be Fact Sheet for Healthcare Providers: https://www.woods-mathews.com/ This test is not yet approved or cleared by the [place] FDA and  has been authorized for detection and/or diagnosis of SARS-CoV-2 by FDA under an Emergency Use Authorization (EUA). This EUA will remain  in effect (meaning this test can be used) for the duration of the COVID-19 declaration under Section 56 4(b)(1) of the Act, 21 U.S.C. section 360bbb-3(b)(1), unless the authorization is terminated or revoked sooner. Performed at [facility], [place] [address] [place], [place] [address]     Radiology Reports DG Hand Complete Right  Result Date: 04/02/2019 CLINICAL DATA:  Cellulitis for 5 days. EXAM: RIGHT HAND - COMPLETE 3+ VIEW COMPARISON:  Contralateral wrist from 2015 FINDINGS: Soft tissue swelling about the hand and wrist. Signs of prior trauma to the ulna. Degenerative changes particularly at first [facility] and about the interphalangeal joints. No signs of destructive bone process. Well-circumscribed lucency in the scaphoid may represent a carpal cyst or intraosseous lipoma. IMPRESSION: Soft tissue swelling about the hand and wrist. No signs of destructive bone process. If there is concern for osteomyelitis MRI may be helpful. Electronically Signed   By: Zetta Bills M.D.   On: 04/02/2019 10:23   [place] RT UPPER EXTREM LTD SOFT TISSUE NON VASCULAR  Result Date: 04/03/2019 CLINICAL DATA:  Right hand cellulitis with swelling for 6 days. EXAM: ULTRASOUND RIGHT UPPER EXTREMITY LIMITED TECHNIQUE: Ultrasound examination of the upper extremity soft tissues was performed in the area of clinical concern. COMPARISON:  Right hand radiographs 04/02/2019 FINDINGS: Ultrasound targeted to the dorsum of the right hand was performed. The soft tissues are swollen and edematous. No focal fluid collection or mass lesion identified.  No evidence of foreign body or significant tenosynovitis. IMPRESSION: 1. No focal fluid collection is seen  in the area of concern to suggest an abscess. The soft tissues are diffusely swollen and edematous. 2. Consider further evaluation with CT or MRI as clinically warranted. Electronically Signed   By: Richardean Sale M.D.   On: 04/03/2019 13:41   VAS [place] LOWER EXTREMITY VENOUS (DVT)  Result Date: 03/29/2019  Lower Venous DVTStudy Indications: Swelling.  Comparison Study: no prior Performing Technologist: Abram Sander RVS  Examination Guidelines: A complete evaluation includes B-mode imaging, spectral Doppler, color Doppler, and power Doppler as needed of all accessible portions of each vessel. Bilateral testing is considered an integral part of a complete examination. Limited examinations for reoccurring indications may be performed as noted. The reflux portion of the exam is performed with the patient in reverse Trendelenburg.  +-----+---------------+---------+-----------+----------+--------------+ RIGHTCompressibilityPhasicitySpontaneityPropertiesThrombus Aging +-----+---------------+---------+-----------+----------+--------------+ CFV  Full           Yes      Yes                                 +-----+---------------+---------+-----------+----------+--------------+   +---------+---------------+---------+-----------+----------+--------------+ LEFT     CompressibilityPhasicitySpontaneityPropertiesThrombus Aging +---------+---------------+---------+-----------+----------+--------------+ CFV      Full           Yes      Yes                                 +---------+---------------+---------+-----------+----------+--------------+ SFJ      Full                                                        +---------+---------------+---------+-----------+----------+--------------+ FV Prox  Full                                                         +---------+---------------+---------+-----------+----------+--------------+ FV Mid   Full                                                        +---------+---------------+---------+-----------+----------+--------------+ FV DistalFull                                                        +---------+---------------+---------+-----------+----------+--------------+ PFV      Full                                                        +---------+---------------+---------+-----------+----------+--------------+ POP      Full           Yes      Yes                                 +---------+---------------+---------+-----------+----------+--------------+  PTV      Full                                                        +---------+---------------+---------+-----------+----------+--------------+ PERO                                                  Not visualized +---------+---------------+---------+-----------+----------+--------------+     Summary: RIGHT: - No evidence of common femoral vein obstruction.  LEFT: - There is no evidence of deep vein thrombosis in the lower extremity.  - No cystic structure found in the popliteal fossa.  *See table(s) above for measurements and observations. Electronically signed by Monica Martinez MD on 03/29/2019 at 4:44:34 PM.    Final     Lab Data:  CBC: Recent Labs  Lab 04/02/19 1002  WBC 13.2*  NEUTROABS 12.1*  HGB 15.2*  HCT 45.0  MCV 92.7  PLT 426.8   Basic Metabolic Panel: Recent Labs  Lab 04/02/19 1002 04/02/19 1313  NA 131* 135  K 4.0 4.3  CL 99 102  CO2 24 24  GLUCOSE 302* 474*  BUN 31* 36*  CREATININE 1.05 1.17*  CALCIUM 9.1 8.6*   GFR: Estimated Creatinine Clearance: 41.3 mL/min (A) (by C-G formula based on SCr of 1.17 mg/dL (H)). Liver Function Tests: Recent Labs  Lab 04/02/19 1313  AST 15  ALT 24  ALKPHOS 54  BILITOT 0.9  PROT 5.9*  ALBUMIN 2.9*   No results for input(s): LIPASE, AMYLASE in  the last 168 hours. No results for input(s): AMMONIA in the last 168 hours. Coagulation Profile: Recent Labs  Lab 04/02/19 1313  INR 1.1   Cardiac Enzymes: No results for input(s): CKTOTAL, CKMB, CKMBINDEX, TROPONINI in the last 168 hours. BNP (last 3 results) No results for input(s): PROBNP in the last 8760 hours. HbA1C: No results for input(s): HGBA1C in the last 72 hours. CBG: Recent Labs  Lab 04/02/19 1729 04/02/19 2043 04/03/19 0810 04/03/19 1209  GLUCAP 323* 121* 109* 221*   Lipid Profile: No results for input(s): CHOL, HDL, LDLCALC, TRIG, CHOLHDL, LDLDIRECT in the last 72 hours. Thyroid Function Tests: No results for input(s): TSH, T4TOTAL, FREET4, T3FREE, THYROIDAB in the last 72 hours. Anemia Panel: No results for input(s): VITAMINB12, FOLATE, FERRITIN, TIBC, IRON, RETICCTPCT in the last 72 hours. Urine analysis:    Component Value Date/Time   COLORURINE YELLOW 07/29/2018 1445   APPEARANCEUR HAZY (A) 07/29/2018 1445   LABSPEC 1.010 07/29/2018 1445   PHURINE 7.0 07/29/2018 1445   GLUCOSEU >=500 (A) 07/29/2018 1445   GLUCOSEU NEGATIVE 07/18/2018 1549   HGBUR NEGATIVE 07/29/2018 1445   HGBUR large 12/16/2009 1024   BILIRUBINUR NEGATIVE 07/29/2018 1445   BILIRUBINUR negative 11/15/2017 1041   KETONESUR NEGATIVE 07/29/2018 1445   PROTEINUR NEGATIVE 07/29/2018 1445   UROBILINOGEN 0.2 07/18/2018 1549   NITRITE NEGATIVE 07/29/2018 1445   LEUKOCYTESUR NEGATIVE 07/29/2018 1445     Ripudeep Rai M.D. [facility] 04/03/2019, 2:24 PM   Call night coverage person covering after 7pm

## 2019-04-03 NOTE — Consult Note (Signed)
   [facility] CM Inpatient Consult   04/03/2019  Nicole [name] November 08, 1940 [id number]   Patient is currently active with [facility] for chronic disease management services with community RN assisting with disease management and pharmacist for medication assistance.   Will notify community RN of inpatient admission and will continue to follow for progression and disposition plans.  Of note, [facility] Care Management services does not replace or interfere with any services that are arranged by inpatient case management or social work.  Netta Cedars, MSN, [facility] Liaison Nurse Mobile Phone [phone]  Toll free office [phone]

## 2019-04-03 NOTE — Plan of Care (Signed)

## 2019-04-03 NOTE — Patient Outreach (Signed)
[facility]) Care Management  04/03/2019  KETZALY CARDELLA [birth year] [id number]   RN Health Coach Hospitalization  Referral Date:  03/11/2019 Referral Source:  Transfer from [place] Reason for Referral:  Continued Disease Management Education Insurance:  [employer] [facility]   Outreach Attempt:  Received notification patient hospitalized at [facility] for hand cellulitis.  RN Health Coach will notify [facility] Liaison of patients admission.  Plan:  RN Health Coach will await [place] recommendations for discharge follow up.  [place] [phone] [email or web address]

## 2019-04-04 ENCOUNTER — Inpatient Hospital Stay (HOSPITAL_COMMUNITY): Payer: Medicare Other | Admitting: Anesthesiology

## 2019-04-04 ENCOUNTER — Encounter (HOSPITAL_COMMUNITY)
Admission: EM | Disposition: A | Discharge status: Home / Self Care | Payer: Self-pay | Source: Ambulatory Visit | Attending: Internal Medicine

## 2019-04-04 ENCOUNTER — Encounter (HOSPITAL_COMMUNITY): Payer: Self-pay | Admitting: Internal Medicine

## 2019-04-04 HISTORY — PX: I & D EXTREMITY: SHX5045

## 2019-04-04 LAB — BASIC METABOLIC PANEL
Anion gap: 6 (ref 5–15)
BUN: 29 mg/dL — ABNORMAL HIGH (ref 8–23)
CO2: 26 mmol/L (ref 22–32)
Calcium: 8.6 mg/dL — ABNORMAL LOW (ref 8.9–10.3)
Chloride: 103 mmol/L (ref 98–111)
Creatinine, Ser: 0.91 mg/dL (ref 0.44–1.00)
GFR calc Af Amer: >60 mL/min (ref >60)
GFR calc non Af Amer: >60 mL/min (ref >60)
Glucose, Bld: 107 mg/dL — ABNORMAL HIGH (ref 70–99)
Potassium: 4.3 mmol/L (ref 3.5–5.1)
Sodium: 135 mmol/L (ref 135–145)

## 2019-04-04 LAB — HEMOGLOBIN A1C
Hgb A1c MFr Bld: 8.6 % — ABNORMAL HIGH (ref 4.8–5.6)
Mean Plasma Glucose: 200.12 mg/dL

## 2019-04-04 LAB — CBC
HCT: 43.2 % (ref 36.0–46.0)
Hemoglobin: 13.7 g/dL (ref 12.0–15.0)
MCH: 31.2 pg (ref 26.0–34.0)
MCHC: 31.7 g/dL (ref 30.0–36.0)
MCV: 98.4 fL (ref 80.0–100.0)
Platelets: 189 10*3/uL (ref 150–400)
RBC: 4.39 MIL/uL (ref 3.87–5.11)
RDW: 14.5 % (ref 11.5–15.5)
WBC: 11.7 10*3/uL — ABNORMAL HIGH (ref 4.0–10.5)
nRBC: 0 % (ref 0.0–0.2)

## 2019-04-04 LAB — GLUCOSE, CAPILLARY
Glucose-Capillary: 171 mg/dL — ABNORMAL HIGH (ref 70–99)
Glucose-Capillary: 117 mg/dL — ABNORMAL HIGH (ref 70–99)
Glucose-Capillary: 105 mg/dL — ABNORMAL HIGH (ref 70–99)
Glucose-Capillary: 103 mg/dL — ABNORMAL HIGH (ref 70–99)
Glucose-Capillary: 179 mg/dL — ABNORMAL HIGH (ref 70–99)

## 2019-04-04 LAB — MRSA PCR SCREENING: MRSA by PCR: POSITIVE — AB

## 2019-04-04 SURGERY — IRRIGATION AND DEBRIDEMENT EXTREMITY
Anesthesia: General | Laterality: Right

## 2019-04-04 MED ORDER — SUCCINYLCHOLINE CHLORIDE 200 MG/10ML IV SOSY
PREFILLED_SYRINGE | INTRAVENOUS | Status: AC
  Filled 2019-04-04: qty 20

## 2019-04-04 MED ORDER — MIDAZOLAM HCL 2 MG/2ML IJ SOLN
INTRAMUSCULAR | Status: AC
  Filled 2019-04-04: qty 2

## 2019-04-04 MED ORDER — LIDOCAINE 2% (20 MG/ML) 5 ML SYRINGE
INTRAMUSCULAR | Status: DC | PRN
  Administered 2019-04-04: 80 mg via INTRAVENOUS

## 2019-04-04 MED ORDER — SODIUM CHLORIDE 0.9 % IR SOLN
Status: DC | PRN
  Administered 2019-04-04: 3000 mL

## 2019-04-04 MED ORDER — ROCURONIUM BROMIDE 10 MG/ML (PF) SYRINGE
PREFILLED_SYRINGE | INTRAVENOUS | Status: AC
  Filled 2019-04-04: qty 20

## 2019-04-04 MED ORDER — CHLORHEXIDINE GLUCONATE 4 % EX LIQD
60.0000 mL | Freq: Once | CUTANEOUS | Status: AC
  Administered 2019-04-04: 4 via TOPICAL
  Filled 2019-04-04: qty 60

## 2019-04-04 MED ORDER — MIDAZOLAM HCL 2 MG/2ML IJ SOLN
INTRAMUSCULAR | Status: DC | PRN
  Administered 2019-04-04: .5 mg via INTRAVENOUS

## 2019-04-04 MED ORDER — DEXAMETHASONE SODIUM PHOSPHATE 10 MG/ML IJ SOLN
INTRAMUSCULAR | Status: DC | PRN
  Administered 2019-04-04: 4 mg via INTRAVENOUS

## 2019-04-04 MED ORDER — ONDANSETRON HCL 4 MG/2ML IJ SOLN
INTRAMUSCULAR | Status: AC
  Filled 2019-04-04: qty 4

## 2019-04-04 MED ORDER — SODIUM CHLORIDE 0.9 % IV SOLN
INTRAVENOUS | Status: AC
  Filled 2019-04-04: qty 500000

## 2019-04-04 MED ORDER — ALBUTEROL SULFATE HFA 108 (90 BASE) MCG/ACT IN AERS
INHALATION_SPRAY | RESPIRATORY_TRACT | Status: AC
  Filled 2019-04-04: qty 6.7

## 2019-04-04 MED ORDER — BUPIVACAINE HCL (PF) 0.5 % IJ SOLN
INTRAMUSCULAR | Status: AC
  Filled 2019-04-04: qty 30

## 2019-04-04 MED ORDER — CEFAZOLIN SODIUM-DEXTROSE 2-4 GM/100ML-% IV SOLN
2.0000 g | INTRAVENOUS | Status: AC
  Administered 2019-04-04: 2 g via INTRAVENOUS
  Filled 2019-04-04: qty 100

## 2019-04-04 MED ORDER — PROPOFOL 10 MG/ML IV BOLUS
INTRAVENOUS | Status: DC | PRN
  Administered 2019-04-04: 150 mg via INTRAVENOUS

## 2019-04-04 MED ORDER — PHENYLEPHRINE 40 MCG/ML (10ML) SYRINGE FOR IV PUSH (FOR BLOOD PRESSURE SUPPORT)
PREFILLED_SYRINGE | INTRAVENOUS | Status: DC | PRN
  Administered 2019-04-04 (×2): 200 ug via INTRAVENOUS

## 2019-04-04 MED ORDER — LIDOCAINE 2% (20 MG/ML) 5 ML SYRINGE
INTRAMUSCULAR | Status: AC
  Filled 2019-04-04: qty 10

## 2019-04-04 MED ORDER — DEXAMETHASONE SODIUM PHOSPHATE 10 MG/ML IJ SOLN
INTRAMUSCULAR | Status: AC
  Filled 2019-04-04: qty 2

## 2019-04-04 MED ORDER — PHENYLEPHRINE HCL-NACL 10-0.9 MG/250ML-% IV SOLN
INTRAVENOUS | Status: DC | PRN
  Administered 2019-04-04: 50 ug/min via INTRAVENOUS

## 2019-04-04 MED ORDER — EPHEDRINE 5 MG/ML INJ
INTRAVENOUS | Status: AC
  Filled 2019-04-04: qty 10

## 2019-04-04 MED ORDER — PROPOFOL 10 MG/ML IV BOLUS
INTRAVENOUS | Status: AC
  Filled 2019-04-04: qty 20

## 2019-04-04 MED ORDER — BUPIVACAINE HCL (PF) 0.5 % IJ SOLN
INTRAMUSCULAR | Status: DC | PRN
  Administered 2019-04-04: 10 mL

## 2019-04-04 MED ORDER — PHENYLEPHRINE HCL (PRESSORS) 10 MG/ML IV SOLN
INTRAVENOUS | Status: AC
  Filled 2019-04-04: qty 2

## 2019-04-04 MED ORDER — FENTANYL CITRATE (PF) 250 MCG/5ML IJ SOLN
INTRAMUSCULAR | Status: DC | PRN
  Administered 2019-04-04 (×3): 50 ug via INTRAVENOUS

## 2019-04-04 MED ORDER — ONDANSETRON HCL 4 MG/2ML IJ SOLN
INTRAMUSCULAR | Status: DC | PRN
  Administered 2019-04-04: 4 mg via INTRAVENOUS

## 2019-04-04 MED ORDER — PROMETHAZINE HCL 25 MG/ML IJ SOLN: 6.2500 mg | INTRAMUSCULAR | Status: DC | PRN

## 2019-04-04 MED ORDER — LACTATED RINGERS IV SOLN: INTRAVENOUS | Status: DC | PRN

## 2019-04-04 MED ORDER — FENTANYL CITRATE (PF) 250 MCG/5ML IJ SOLN
INTRAMUSCULAR | Status: AC
  Filled 2019-04-04: qty 5

## 2019-04-04 MED ORDER — FENTANYL CITRATE (PF) 100 MCG/2ML IJ SOLN
INTRAMUSCULAR | Status: AC
  Filled 2019-04-04: qty 2

## 2019-04-04 MED ORDER — PHENYLEPHRINE 40 MCG/ML (10ML) SYRINGE FOR IV PUSH (FOR BLOOD PRESSURE SUPPORT)
PREFILLED_SYRINGE | INTRAVENOUS | Status: AC
  Filled 2019-04-04: qty 10

## 2019-04-04 MED ORDER — LACTATED RINGERS IV SOLN: INTRAVENOUS | Status: DC

## 2019-04-04 MED ORDER — FENTANYL CITRATE (PF) 100 MCG/2ML IJ SOLN
25.0000 ug | INTRAMUSCULAR | Status: DC | PRN
  Administered 2019-04-04: 50 ug via INTRAVENOUS

## 2019-04-04 SURGICAL SUPPLY — 38 items
BAG SPEC THK2 15X12 ZIP CLS (MISCELLANEOUS) ×1
BAG ZIPLOCK 12X15 (MISCELLANEOUS) ×2 IMPLANT
BNDG COHESIVE 4X5 TAN STRL (GAUZE/BANDAGES/DRESSINGS) ×2 IMPLANT
BNDG ELASTIC 4X5.8 VLCR STR LF (GAUZE/BANDAGES/DRESSINGS) ×1 IMPLANT
BNDG GAUZE ELAST 4 BULKY (GAUZE/BANDAGES/DRESSINGS) ×2 IMPLANT
COVER MAYO STAND STRL (DRAPES) ×2 IMPLANT
COVER SURGICAL LIGHT HANDLE (MISCELLANEOUS) ×2 IMPLANT
COVER WAND RF STERILE (DRAPES) IMPLANT
CUFF TOURN SGL QUICK 18X4 (TOURNIQUET CUFF) ×2 IMPLANT
CUFF TOURN SGL QUICK 24 (TOURNIQUET CUFF)
CUFF TRNQT CYL 24X4X16.5-23 (TOURNIQUET CUFF) IMPLANT
DRAIN PENROSE 0.25X18 (DRAIN) ×1 IMPLANT
DRAPE U-SHAPE 47X51 STRL (DRAPES) ×2 IMPLANT
DRSG PAD ABDOMINAL 8X10 ST (GAUZE/BANDAGES/DRESSINGS) ×2 IMPLANT
ELECT REM PT RETURN 15FT ADLT (MISCELLANEOUS) ×2 IMPLANT
EVACUATOR 1/8 PVC DRAIN (DRAIN) ×2 IMPLANT
GAUZE SPONGE 4X4 12PLY STRL (GAUZE/BANDAGES/DRESSINGS) ×1 IMPLANT
GAUZE XEROFORM 1X8 LF (GAUZE/BANDAGES/DRESSINGS) ×2 IMPLANT
GLOVE BIOGEL PI IND STRL 8 (GLOVE) ×1 IMPLANT
GLOVE BIOGEL PI INDICATOR 8 (GLOVE) ×1
GLOVE SURG SYN 7.5  E (GLOVE) ×2
GLOVE SURG SYN 7.5 E (GLOVE) ×1 IMPLANT
GLOVE SURG SYN 7.5 PF PI (GLOVE) ×1 IMPLANT
KIT BASIN OR (CUSTOM PROCEDURE TRAY) ×2 IMPLANT
KIT TURNOVER KIT A (KITS) IMPLANT
NEEDLE HYPO 22GX1.5 SAFETY (NEEDLE) ×2 IMPLANT
NS IRRIG 1000ML POUR BTL (IV SOLUTION) ×2 IMPLANT
PACK ORTHO EXTREMITY (CUSTOM PROCEDURE TRAY) ×2 IMPLANT
PENCIL SMOKE EVACUATOR (MISCELLANEOUS) IMPLANT
PROTECTOR NERVE ULNAR (MISCELLANEOUS) ×2 IMPLANT
SET IRRIG Y TYPE TUR BLADDER L (SET/KITS/TRAYS/PACK) ×2 IMPLANT
SPLINT FIBERGLASS 4X30 (CAST SUPPLIES) ×2 IMPLANT
STOCKINETTE 8 INCH (MISCELLANEOUS) ×2 IMPLANT
SUT MNCRL AB 3-0 PS2 18 (SUTURE) ×4 IMPLANT
SUT PROLENE 4 0 PS 2 18 (SUTURE) ×4 IMPLANT
SWAB COLLECTION DEVICE MRSA (MISCELLANEOUS) IMPLANT
SWAB CULTURE ESWAB REG 1ML (MISCELLANEOUS) IMPLANT
SYR CONTROL 10ML LL (SYRINGE) ×2 IMPLANT

## 2019-04-04 NOTE — Anesthesia Postprocedure Evaluation (Signed)
Anesthesia Post Note  Patient: Nicole [name]  Procedure(s) Performed: IRRIGATION AND DEBRIDEMENT RIGHT HAND (Right )     Patient location during evaluation: PACU Anesthesia Type: General Level of consciousness: awake and alert Pain management: pain level controlled Vital Signs Assessment: post-procedure vital signs reviewed and stable Respiratory status: spontaneous breathing, nonlabored ventilation, respiratory function stable and patient connected to nasal cannula oxygen Cardiovascular status: blood pressure returned to baseline and stable Postop Assessment: no apparent nausea or vomiting Anesthetic complications: no    Last Vitals:  Vitals:   04/04/19 1810 04/04/19 1815  BP: 133/84 132/82  Pulse: 71 82  Resp: 12 17  Temp: 36.6 C   SpO2: 100% 100%    Last Pain:  Vitals:   04/04/19 1810  TempSrc:   PainSc: 0-No pain                 Yaqub Arney S

## 2019-04-04 NOTE — Anesthesia Procedure Notes (Signed)
Procedure Name: LMA Insertion Date/Time: 04/04/2019 5:12 PM Performed by: Cynda Familia, CRNA Pre-anesthesia Checklist: Patient identified, Emergency Drugs available, Suction available and Patient being monitored Patient Re-evaluated:Patient Re-evaluated prior to induction Oxygen Delivery Method: Circle System Utilized Preoxygenation: Pre-oxygenation with 100% oxygen Induction Type: IV induction Ventilation: Mask ventilation without difficulty LMA: LMA inserted LMA Size: 4.0 Number of attempts: 1 Placement Confirmation: positive ETCO2 Tube secured with: Tape Dental Injury: Teeth and Oropharynx as per pre-operative assessment  Comments: Smooth IV induction Rose-- LMA AM CRNA atraumatic-- teeth ands mouth as preop -- bilat BS

## 2019-04-04 NOTE — Progress Notes (Signed)
[facility]                                                                              Patient Demographics  Nicole [name], is a 79 y.o. female, DOB - 12/24/1940, EYC:[id number]  Admit date - 04/02/2019   Admitting Physician Reubin Milan, MD  Outpatient Primary MD for the patient is [name], MD  Outpatient specialists:   LOS - 2  days   Medical records reviewed and are as summarized below:    Chief Complaint  Patient presents with  . hand infection       Brief summary   Patient is a 79 year old female with a history of chronic atrial fibrillation, adeno CA right lung, history of bronchitis, history of cervical CA, nonobstructive CAD, type 2 diabetes mellitus, hypertension presented with right hand swelling tenderness and redness for the past 3 days with an episode of fever on the day of admission.  She also reported chills for few days.  Reported she had a splinter in her right thumb, did not remember how she got it, removed it with some sterilized tweezers and needle but after that she started whelping pain, erythema and edema on the hand.  She called her PCP, Dr. Wynetta Emery who recommended her to go to ED.  She also reported lower extremity edema and had been taking furosemide for the past 2 days. In ED, temp 97.8, pulse 111, white count 13.2, BP 94/56  Assessment & Plan    Principal Problem: Sepsis secondary to cellulitis of right hand -Patient met sepsis criteria at the time of admission due to tachycardia, leukocytosis, hypotension, source likely due to cellulitis of the right hand -Continue pain control, IV Rocephin -  Soft tissue ultrasound right hand showed no focal abscess. -Discussed with hand surgery again today, she has fluctuance at the base of the thumb with tenderness.  Plan for I&D today, highly appreciate assistance   Active Problems:  Diabetes mellitus type 2, uncontrolled with hyperglycemia -Hold outpatient oral  hypoglycemics, - continue sliding scale insulin moderate, NovoLog meal coverage Hemoglobin A1c 8.6     Essential hypertension -BP soft, hold Avapro  -Continue Cardizem, will hold furosemide    Hypertriglyceridemia -Continue Crestor    Coronary artery disease -Continue apixaban, Crestor    Paroxysmal atrial fibrillation (HCC) Continue Cardizem, as needed metoprolol Continue apixaban  Code Status: Full CODE STATUS DVT Prophylaxis: Apixaban Family Communication: Discussed all imaging results, lab results, explained to the patient   Disposition Plan: Patient from home, anticipated discharge home once right hand swelling, pain and erythema is improving.  Currently quite swollen and requiring IV antibiotics, plan I&D by hand surgery today.  Hopefully DC home in 24 to 48 hours   Time Spent in minutes 25 minutes  Procedures:  Localized ultrasounds  Consultants:   Hand surgery  Antimicrobials:   Anti-infectives (From admission, onward)   Start     Dose/Rate Route Frequency Ordered Stop   04/02/19 1645  cefTRIAXone (ROCEPHIN) 1 g in sodium chloride 0.9 % 100 mL IVPB     1 g 200 mL/hr over 30 Minutes Intravenous Every 24 hours 04/02/19  1631     04/02/19 1615  piperacillin-tazobactam (ZOSYN) IVPB 3.375 g  Status:  Discontinued     3.375 g 100 mL/hr over 30 Minutes Intravenous  Once 04/02/19 1557 04/02/19 1631   04/02/19 1615  vancomycin (VANCOCIN) IVPB 1000 mg/200 mL premix  Status:  Discontinued     1,000 mg 200 mL/hr over 60 Minutes Intravenous  Once 04/02/19 1557 04/02/19 1631   04/02/19 1330  ceFAZolin (ANCEF) IVPB 1 g/50 mL premix     1 g 100 mL/hr over 30 Minutes Intravenous  Once 04/02/19 1319 04/02/19 1440         Medications  Scheduled Meds: . apixaban  5 mg Oral BID  . diltiazem  360 mg Oral Daily  . furosemide  20 mg Oral Daily  . hydrocortisone  1 application Rectal TID  . insulin aspart  0-15 Units Subcutaneous TID WC  . insulin aspart  0-5 Units  Subcutaneous QHS  . insulin aspart  3 Units Subcutaneous TID WC  .  morphine injection  2 mg Intravenous Once  . ondansetron (ZOFRAN) IV  4 mg Intravenous Once  . rosuvastatin  5 mg Oral Daily   Continuous Infusions: . cefTRIAXone (ROCEPHIN)  IV 1 g (04/03/19 1625)   PRN Meds:.HYDROcodone-acetaminophen      Subjective:   Nicole [name] was seen and examined today.  Right hand swelling is improving however has tenderness and fluctuance at the base of the thumb.  No fevers.  Patient denies dizziness, chest pain, shortness of breath, abdominal pain, N/V/D/C, new weakness, numbess, tingling. No acute events overnight.    Objective:   Vitals:   04/03/19 1423 04/03/19 2033 04/04/19 0554 04/04/19 0950  BP: (!) 81/48 99/67 (!) 109/59 96/69  Pulse: (!) 56 71 78 78  Resp: _0 Temp: 98.2 F (36.8 C) 98.3 F (36.8 C) 98 F (36.7 C) 98.2 F (36.8 C)  TempSrc: Oral Oral Oral Oral  SpO2: 96% 98% 98% 94%  Weight:      Height:        Intake/Output Summary (Last 24 hours) at 04/04/2019 1406 Last data filed at 04/04/2019 0949 Gross per 24 hour  Intake 850 ml  Output 250 ml  Net 600 ml     Wt Readings from Last 3 Encounters:  04/02/19 73.5 kg  04/02/19 73.5 kg  04/02/19 73.5 kg    Physical Exam  General: Alert and oriented x 3, NAD  Cardiovascular: S1 S2 clear, RRR. No pedal edema b/l  Respiratory: CTAB, no wheezing, rales or rhonchi  Gastrointestinal: Soft, nontender, nondistended, NBS  Ext: no pedal edema bilaterally  Neuro: no new deficits  Musculoskeletal: No cyanosis, clubbing  Skin: Right hand cellulitis with fluctuance and tenderness at the base of the thumb  Psych: Normal affect and demeanor, alert and oriented x3     RIGHT HAND SWELLING,CELLULITIS          Data Reviewed:  I have personally reviewed following labs and imaging studies  Micro Results Recent Results (from the past 240 hour(s))  Blood Culture (routine x 2)     Status: None  (Preliminary result)   Collection Time: 04/02/19  1:18 PM   Specimen: BLOOD LEFT FOREARM  Result Value Ref Range Status   Specimen Description   Final    BLOOD LEFT FOREARM Performed at [facility], [address] [place], [place] [address]    Special Requests   Final    BOTTLES DRAWN AEROBIC AND ANAEROBIC  Blood Culture adequate volume Performed at [facility] [address], [place], [place] [address]    Culture   Final    NO GROWTH 2 DAYS Performed at [facility] [address] [place], [place] [address]    Report Status PENDING  Incomplete  Blood Culture (routine x 2)     Status: None (Preliminary result)   Collection Time: 04/02/19  2:08 PM   Specimen: BLOOD  Result Value Ref Range Status   Specimen Description   Final    BLOOD RIGHT ANTECUBITAL Performed at [facility] [address], [place], [place] [address]    Special Requests   Final    BOTTLES DRAWN AEROBIC AND ANAEROBIC Blood Culture adequate volume Performed at [facility] [address], [place], [place] [address]    Culture   Final    NO GROWTH 2 DAYS Performed at [facility] [address] [place], [place] [address]    Report Status PENDING  Incomplete  SARS CORONAVIRUS 2 (TAT 6-24 HRS) Nasopharyngeal Nasopharyngeal Swab     Status: None   Collection Time: 04/02/19  4:29 PM   Specimen: Nasopharyngeal Swab  Result Value Ref Range Status   SARS Coronavirus 2 NEGATIVE NEGATIVE Final    Comment: (NOTE) SARS-CoV-2 target nucleic acids are NOT DETECTED. The SARS-CoV-2 RNA is generally detectable in upper and lower respiratory specimens during the acute phase of infection. Negative results do not preclude SARS-CoV-2 infection, do not rule out co-infections with other pathogens, and should not be used as the sole basis for treatment or other patient management decisions. Negative results must be combined with  clinical observations, patient history, and epidemiological information. The expected result is Negative. Fact Sheet for Patients: SugarRoll.be Fact Sheet for Healthcare Providers: https://www.woods-mathews.com/ This test is not yet approved or cleared by the [place] FDA and  has been authorized for detection and/or diagnosis of SARS-CoV-2 by FDA under an Emergency Use Authorization (EUA). This EUA will remain  in effect (meaning this test can be used) for the duration of the COVID-19 declaration under Section 56 4(b)(1) of the Act, 21 U.S.C. section 360bbb-3(b)(1), unless the authorization is terminated or revoked sooner. Performed at [facility], [place] [address] [place], [place] [address]     Radiology Reports DG Hand Complete Right  Result Date: 04/02/2019 CLINICAL DATA:  Cellulitis for 5 days. EXAM: RIGHT HAND - COMPLETE 3+ VIEW COMPARISON:  Contralateral wrist from 2015 FINDINGS: Soft tissue swelling about the hand and wrist. Signs of prior trauma to the ulna. Degenerative changes particularly at first [facility] and about the interphalangeal joints. No signs of destructive bone process. Well-circumscribed lucency in the scaphoid may represent a carpal cyst or intraosseous lipoma. IMPRESSION: Soft tissue swelling about the hand and wrist. No signs of destructive bone process. If there is concern for osteomyelitis MRI may be helpful. Electronically Signed   By: Zetta Bills M.D.   On: 04/02/2019 10:23   [place] RT UPPER EXTREM LTD SOFT TISSUE NON VASCULAR  Result Date: 04/03/2019 CLINICAL DATA:  Right hand cellulitis with swelling for 6 days. EXAM: ULTRASOUND RIGHT UPPER EXTREMITY LIMITED TECHNIQUE: Ultrasound examination of the upper extremity soft tissues was performed in the area of clinical concern. COMPARISON:  Right hand radiographs 04/02/2019 FINDINGS: Ultrasound targeted to the dorsum of the right hand was performed. The soft tissues are  swollen and edematous. No focal fluid collection or mass lesion identified. No evidence  of foreign body or significant tenosynovitis. IMPRESSION: 1. No focal fluid collection is seen in the area of concern to suggest an abscess. The soft tissues are diffusely swollen and edematous. 2. Consider further evaluation with CT or MRI as clinically warranted. Electronically Signed   By: Richardean Sale M.D.   On: 04/03/2019 13:41   VAS [place] LOWER EXTREMITY VENOUS (DVT)  Result Date: 03/29/2019  Lower Venous DVTStudy Indications: Swelling.  Comparison Study: no prior Performing Technologist: Abram Sander RVS  Examination Guidelines: A complete evaluation includes B-mode imaging, spectral Doppler, color Doppler, and power Doppler as needed of all accessible portions of each vessel. Bilateral testing is considered an integral part of a complete examination. Limited examinations for reoccurring indications may be performed as noted. The reflux portion of the exam is performed with the patient in reverse Trendelenburg.  +-----+---------------+---------+-----------+----------+--------------+ RIGHTCompressibilityPhasicitySpontaneityPropertiesThrombus Aging +-----+---------------+---------+-----------+----------+--------------+ CFV  Full           Yes      Yes                                 +-----+---------------+---------+-----------+----------+--------------+   +---------+---------------+---------+-----------+----------+--------------+ LEFT     CompressibilityPhasicitySpontaneityPropertiesThrombus Aging +---------+---------------+---------+-----------+----------+--------------+ CFV      Full           Yes      Yes                                 +---------+---------------+---------+-----------+----------+--------------+ SFJ      Full                                                        +---------+---------------+---------+-----------+----------+--------------+ FV Prox  Full                                                         +---------+---------------+---------+-----------+----------+--------------+ FV Mid   Full                                                        +---------+---------------+---------+-----------+----------+--------------+ FV DistalFull                                                        +---------+---------------+---------+-----------+----------+--------------+ PFV      Full                                                        +---------+---------------+---------+-----------+----------+--------------+ POP      Full           Yes      Yes                                 +---------+---------------+---------+-----------+----------+--------------+  PTV      Full                                                        +---------+---------------+---------+-----------+----------+--------------+ PERO                                                  Not visualized +---------+---------------+---------+-----------+----------+--------------+     Summary: RIGHT: - No evidence of common femoral vein obstruction.  LEFT: - There is no evidence of deep vein thrombosis in the lower extremity.  - No cystic structure found in the popliteal fossa.  *See table(s) above for measurements and observations. Electronically signed by Monica Martinez MD on 03/29/2019 at 4:44:34 PM.    Final     Lab Data:  CBC: Recent Labs  Lab 04/02/19 1002 04/04/19 0533  WBC 13.2* 11.7*  NEUTROABS 12.1*  --   HGB 15.2* 13.7  HCT 45.0 43.2  MCV 92.7 98.4  PLT 216.0 614   Basic Metabolic Panel: Recent Labs  Lab 04/02/19 1002 04/02/19 1313 04/04/19 0533  NA 131* 135 135  K 4.0 4.3 4.3  CL 99 102 103  CO2 _0 GLUCOSE 302* 474* 107*  BUN 31* 36* 29*  CREATININE 1.05 1.17* 0.91  CALCIUM 9.1 8.6* 8.6*   GFR: Estimated Creatinine Clearance: 53.1 mL/min (by C-G formula based on SCr of 0.91 mg/dL). Liver Function Tests: Recent Labs  Lab  04/02/19 1313  AST 15  ALT 24  ALKPHOS 54  BILITOT 0.9  PROT 5.9*  ALBUMIN 2.9*   No results for input(s): LIPASE, AMYLASE in the last 168 hours. No results for input(s): AMMONIA in the last 168 hours. Coagulation Profile: Recent Labs  Lab 04/02/19 1313  INR 1.1   Cardiac Enzymes: No results for input(s): CKTOTAL, CKMB, CKMBINDEX, TROPONINI in the last 168 hours. BNP (last 3 results) No results for input(s): PROBNP in the last 8760 hours. HbA1C: Recent Labs    04/04/19 0533  HGBA1C 8.6*   CBG: Recent Labs  Lab 04/03/19 1209 04/03/19 1632 04/03/19 2037 04/04/19 0749 04/04/19 1255  GLUCAP 221* 239* 165* 171* 117*   Lipid Profile: No results for input(s): CHOL, HDL, LDLCALC, TRIG, CHOLHDL, LDLDIRECT in the last 72 hours. Thyroid Function Tests: No results for input(s): TSH, T4TOTAL, FREET4, T3FREE, THYROIDAB in the last 72 hours. Anemia Panel: No results for input(s): VITAMINB12, FOLATE, FERRITIN, TIBC, IRON, RETICCTPCT in the last 72 hours. Urine analysis:    Component Value Date/Time   COLORURINE YELLOW 07/29/2018 1445   APPEARANCEUR HAZY (A) 07/29/2018 1445   LABSPEC 1.010 07/29/2018 1445   PHURINE 7.0 07/29/2018 1445   GLUCOSEU >=500 (A) 07/29/2018 1445   GLUCOSEU NEGATIVE 07/18/2018 1549   HGBUR NEGATIVE 07/29/2018 1445   HGBUR large 12/16/2009 1024   BILIRUBINUR NEGATIVE 07/29/2018 1445   BILIRUBINUR negative 11/15/2017 1041   KETONESUR NEGATIVE 07/29/2018 1445   PROTEINUR NEGATIVE 07/29/2018 1445   UROBILINOGEN 0.2 07/18/2018 1549   NITRITE NEGATIVE 07/29/2018 1445   LEUKOCYTESUR NEGATIVE 07/29/2018 1445     Danthony Kendrix M.D. [facility] 04/04/2019, 2:06 PM   Call night coverage person covering after 7pm

## 2019-04-04 NOTE — Consult Note (Signed)
Reason for Consult:Right hand infection Referring Physician: Tyler Pita  Nicole [name] is an 79 y.o. female.  HPI: Nicole [name] was admitted yesterday with cellulitis of the right hand. She said the day before she got a splinter and removed it herself and it started getting angry almost immediately. She had swelling, redness, and streaking up her arm. She denies fevers, chills, sweats, N/V at this point as the hand is feeling somewhat better than yesterday. [place] yesterday did not show any fluid collections but she developed a superficial abscess around the base of her thumb and hand surgery was consulted. She is RHD.  Past Medical History:  Diagnosis Date  . A-fib ([place])   . Adenocarcinoma of right lung, stage 3 ([place]) 07/28/2016  . Atrial flutter ([place]) 10/05/2016  . Bronchitis    hx of  . 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 Cancer-related pain 10/06/2016  . Coronary artery disease 02/21/2017   JAN 2019 Prox RCA lesion is 25% stenosed. Prox Cx lesion is 30% stenosed. Mid Cx lesion is 25% stenosed. Ost 3rd Mrg lesion is 30% stenosed. Prox LAD lesion is 25% stenosed. Mid LAD lesion is 25% stenosed. The left ventricular systolic function is normal. LV end diastolic pressure is normal. The left ventricular ejection fraction is 55-65% by visual estimate. There is no aortic valve stenosis.   N  . Diabetes mellitus    type 2  . Edema 07/12/2017  . Essential hypertension 01/20/2009  . Gallstones   . Headache   . History of radiation therapy 08/09/2016 to 09/19/2016   Right lung was treated to 60 Gy in 30 fractions at 2 Gy per fraction  . Hypertension   . Hypertriglyceridemia 08/22/2012  . Low back pain   . Lung mass    with lymphadenopathy  . Osteoarthritis   . Type II diabetes mellitus with manifestations ([place]) 01/20/2009   Estimated Creatinine Clearance: 49.8 mL/min (by C-G formula based on SCr of 1 mg/dL).    Past Surgical History:  Procedure Laterality Date  . ABDOMINAL HYSTERECTOMY  2004  .  APPENDECTOMY  when 79 years old  . COLONOSCOPY  08-05-09   [name]  . INCONTINENCE SURGERY    . KNEE ARTHROSCOPY Left   . LEFT HEART CATH AND CORONARY ANGIOGRAPHY N/A 02/24/2017   Procedure: LEFT HEART CATH AND CORONARY ANGIOGRAPHY;  Surgeon: Jettie Booze, MD;  Location: [facility];  Service: Cardiovascular;  Laterality: N/A;  . LEFT HEART CATHETERIZATION WITH CORONARY ANGIOGRAM N/A 07/19/2013   Procedure: LEFT HEART CATHETERIZATION WITH CORONARY ANGIOGRAM;  Surgeon: Sinclair Grooms, MD;  Location: [facility] CATH LAB;  Service: Cardiovascular;  Laterality: N/A;  . POLYPECTOMY  08-05-09   2 polyps  . TOTAL KNEE ARTHROPLASTY Left 03/28/2012   Procedure: LEFT TOTAL KNEE ARTHROPLASTY;  Surgeon: Tobi Bastos, MD;  Location: [facility] OR[facility];  Service: Orthopedics;  Laterality: Left;  . TUBAL LIGATION    . VIDEO BRONCHOSCOPY WITH ENDOBRONCHIAL ULTRASOUND N/A 07/11/2016   Procedure: VIDEO BRONCHOSCOPY WITH ENDOBRONCHIAL ULTRASOUND;  Surgeon: Marshell Garfinkel, MD;  Location: [facility];  Service: Pulmonary;  Laterality: N/A;    Family History  Problem Relation Age of Onset  . Heart attack Father   . Heart disease Father   . Arthritis Other   . Cancer Other        colon, lst degree relative  . Diabetes Other        st degree relative  . Hyperlipidemia Other   . Hypertension Other   .  Colon cancer Neg Hx     Social History:  reports that she has never smoked. She has never used smokeless tobacco. She reports that she does not drink alcohol or use drugs.  Allergies:  Allergies  Allergen Reactions  . No Known Allergies     Medications: I have reviewed the patient's current medications.  Results for orders placed or performed during the hospital encounter of 04/02/19 (from the past 48 hour(s))  Lactic acid, plasma     Status: None   Collection Time: 04/02/19  1:13 PM  Result Value Ref Range   Lactic Acid, Venous 1.9 0.5 - 1.9 mmol/L    Comment: Performed at [facility],  [place] [address], [place], [place] [address]  Comprehensive metabolic panel     Status: Abnormal   Collection Time: 04/02/19  1:13 PM  Result Value Ref Range   Sodium 135 135 - 145 mmol/L   Potassium 4.3 3.5 - 5.1 mmol/L   Chloride 102 98 - 111 mmol/L   CO2 24 22 - 32 mmol/L   Glucose, Bld 474 (H) 70 - 99 mg/dL    Comment: Glucose reference range applies only to samples taken after fasting for at least 8 hours.   BUN 36 (H) 8 - 23 mg/dL   Creatinine, Ser 1.17 (H) 0.44 - 1.00 mg/dL   Calcium 8.6 (L) 8.9 - 10.3 mg/dL   Total Protein 5.9 (L) 6.5 - 8.1 g/dL   Albumin 2.9 (L) 3.5 - 5.0 g/dL   AST 15 15 - 41 U/L   ALT 24 0 - 44 U/L   Alkaline Phosphatase 54 38 - 126 U/L   Total Bilirubin 0.9 0.3 - 1.2 mg/dL   GFR calc non Af Amer 45 (L) >60 mL/min   GFR calc Af Amer 52 (L) >60 mL/min   Anion gap 9 5 - 15    Comment: Performed at [facility], [place] [address], [place], [place] [address]  APTT     Status: None   Collection Time: 04/02/19  1:13 PM  Result Value Ref Range   aPTT 29 24 - 36 seconds    Comment: Performed at [facility], [place] [name], [place], [place] [address]  Protime-INR     Status: None   Collection Time: 04/02/19  1:13 PM  Result Value Ref Range   Prothrombin Time 14.2 11.4 - 15.2 seconds   INR 1.1 0.8 - 1.2    Comment: (NOTE) INR goal varies based on device and disease states. Performed at [facility], [place] [address], [place], [place] [address]   Blood Culture (routine x 2)     Status: None (Preliminary result)   Collection Time: 04/02/19  1:18 PM   Specimen: BLOOD LEFT FOREARM  Result Value Ref Range   Specimen Description      BLOOD LEFT FOREARM Performed at [place] [address], [place], [place] [address]    Special Requests      BOTTLES DRAWN AEROBIC AND ANAEROBIC Blood Culture adequate volume Performed at [place] [address],  [place], [place] [address]    Culture      NO GROWTH 2 DAYS Performed at [facility], [place] [address], [place], [place] [address]    Report Status PENDING   Blood Culture (routine x 2)     Status: None (Preliminary result)   Collection Time: 04/02/19  2:08 PM   Specimen: BLOOD  Result Value Ref Range   Specimen Description      BLOOD RIGHT ANTECUBITAL Performed at [place] [address], [place], [place] [address]    Special Requests      BOTTLES DRAWN AEROBIC AND ANAEROBIC Blood Culture adequate volume Performed at [place] [address] [place], [place] [address]    Culture      NO GROWTH 2 DAYS Performed at [facility], [place] [address] [place], [place] [address]    Report Status PENDING   SARS CORONAVIRUS 2 (TAT 6-24 HRS) Nasopharyngeal Nasopharyngeal Swab     Status: None   Collection Time: 04/02/19  4:29 PM   Specimen: Nasopharyngeal Swab  Result Value Ref Range   SARS Coronavirus 2 NEGATIVE NEGATIVE    Comment: (NOTE) SARS-CoV-2 target nucleic acids are NOT DETECTED. The SARS-CoV-2 RNA is generally detectable in upper and lower respiratory specimens during the acute phase of infection. Negative results do not preclude SARS-CoV-2 infection, do not rule out co-infections with other pathogens, and should not be used as the sole basis for treatment or other patient management decisions. Negative results must be combined with clinical observations, patient history, and epidemiological information. The expected result is Negative. Fact Sheet for Patients: SugarRoll.be Fact Sheet for Healthcare Providers: https://www.woods-mathews.com/ This test is not yet approved or cleared by the [place] FDA and  has been authorized for detection and/or diagnosis of SARS-CoV-2 by FDA under an Emergency Use Authorization (EUA). This EUA will remain  in effect (meaning this test can be  used) for the duration of the COVID-19 declaration under Section 56 4(b)(1) of the Act, 21 U.S.C. section 360bbb-3(b)(1), unless the authorization is terminated or revoked sooner. Performed at [facility], [place] [address] [place], [place] [address]   CBG monitoring, ED     Status: Abnormal   Collection Time: 04/02/19  5:29 PM  Result Value Ref Range   Glucose-Capillary 323 (H) 70 - 99 mg/dL    Comment: Glucose reference range applies only to samples taken after fasting for at least 8 hours.  Glucose, capillary     Status: Abnormal   Collection Time: 04/02/19  8:43 PM  Result Value Ref Range   Glucose-Capillary 121 (H) 70 - 99 mg/dL    Comment: Glucose reference range applies only to samples taken after fasting for at least 8 hours.   Comment 1 QC Due   Glucose, capillary     Status: Abnormal   Collection Time: 04/03/19  8:10 AM  Result Value Ref Range   Glucose-Capillary 109 (H) 70 - 99 mg/dL    Comment: Glucose reference range applies only to samples taken after fasting for at least 8 hours.   Comment 1 Notify RN    Comment 2 Document in Chart   Glucose, capillary     Status: Abnormal   Collection Time: 04/03/19 12:09 PM  Result Value Ref Range   Glucose-Capillary 221 (H) 70 - 99 mg/dL    Comment: Glucose reference range applies only to samples taken after fasting for at least 8 hours.   Comment 1 Notify RN    Comment 2 Document in Chart   Glucose, capillary     Status: Abnormal   Collection Time: 04/03/19  4:32 PM  Result Value Ref Range   Glucose-Capillary 239 (H) 70 - 99 mg/dL    Comment: Glucose reference range applies only to samples taken after fasting for at least 8 hours.  Comment 1 Notify RN    Comment 2 Document in Chart   Glucose, capillary     Status: Abnormal   Collection Time: 04/03/19  8:37 PM  Result Value Ref Range   Glucose-Capillary 165 (H) 70 - 99 mg/dL    Comment: Glucose reference range applies only to samples taken after fasting for at least  8 hours.  CBC     Status: Abnormal   Collection Time: 04/04/19  5:33 AM  Result Value Ref Range   WBC 11.7 (H) 4.0 - 10.5 K/uL   RBC 4.39 3.87 - 5.11 MIL/uL   Hemoglobin 13.7 12.0 - 15.0 g/dL   HCT 43.2 36.0 - 46.0 %   MCV 98.4 80.0 - 100.0 fL   MCH 31.2 26.0 - 34.0 pg   MCHC 31.7 30.0 - 36.0 g/dL   RDW 14.5 11.5 - 15.5 %   Platelets 189 150 - 400 K/uL   nRBC 0.0 0.0 - 0.2 %    Comment: Performed at [facility], [place] [address] [place], [place] [address]  Basic metabolic panel     Status: Abnormal   Collection Time: 04/04/19  5:33 AM  Result Value Ref Range   Sodium 135 135 - 145 mmol/L   Potassium 4.3 3.5 - 5.1 mmol/L   Chloride 103 98 - 111 mmol/L   CO2 26 22 - 32 mmol/L   Glucose, Bld 107 (H) 70 - 99 mg/dL    Comment: Glucose reference range applies only to samples taken after fasting for at least 8 hours.   BUN 29 (H) 8 - 23 mg/dL   Creatinine, Ser 0.91 0.44 - 1.00 mg/dL   Calcium 8.6 (L) 8.9 - 10.3 mg/dL   GFR calc non Af Amer >60 >60 mL/min   GFR calc Af Amer >60 >60 mL/min   Anion gap 6 5 - 15    Comment: Performed at [facility], [place] [address], [place], [place] [address]  Hemoglobin A1c     Status: Abnormal   Collection Time: 04/04/19  5:33 AM  Result Value Ref Range   Hgb A1c MFr Bld 8.6 (H) 4.8 - 5.6 %    Comment: (NOTE) Pre diabetes:          5.7%-6.4% Diabetes:              >6.4% Glycemic control for   <7.0% adults with diabetes    Mean Plasma Glucose 200.12 mg/dL    Comment: Performed at [facility] [address], [place], [place] [address]  Glucose, capillary     Status: Abnormal   Collection Time: 04/04/19  7:49 AM  Result Value Ref Range   Glucose-Capillary 171 (H) 70 - 99 mg/dL    Comment: Glucose reference range applies only to samples taken after fasting for at least 8 hours.    [place] RT UPPER EXTREM LTD SOFT TISSUE NON VASCULAR  Result Date: 04/03/2019 CLINICAL DATA:  Right hand cellulitis with  swelling for 6 days. EXAM: ULTRASOUND RIGHT UPPER EXTREMITY LIMITED TECHNIQUE: Ultrasound examination of the upper extremity soft tissues was performed in the area of clinical concern. COMPARISON:  Right hand radiographs 04/02/2019 FINDINGS: Ultrasound targeted to the dorsum of the right hand was performed. The soft tissues are swollen and edematous. No focal fluid collection or mass lesion identified. No evidence of foreign body or significant tenosynovitis. IMPRESSION: 1. No focal fluid collection is seen in the area of concern to suggest an abscess. The soft tissues  are diffusely swollen and edematous. 2. Consider further evaluation with CT or MRI as clinically warranted. Electronically Signed   By: Richardean Sale M.D.   On: 04/03/2019 13:41    Review of Systems  Constitutional: Negative for chills, diaphoresis and fever.  HENT: Negative for ear discharge, ear pain, hearing loss and tinnitus.   Eyes: Negative for photophobia and pain.  Respiratory: Negative for cough and shortness of breath.   Cardiovascular: Negative for chest pain.  Gastrointestinal: Negative for abdominal pain, nausea and vomiting.  Genitourinary: Negative for dysuria, flank pain, frequency and urgency.  Musculoskeletal: Positive for arthralgias (Right hand). Negative for back pain, myalgias and neck pain.  Neurological: Negative for dizziness and headaches.  Hematological: Does not bruise/bleed easily.  Psychiatric/Behavioral: The patient is not nervous/anxious.    Blood pressure 96/69, pulse 78, temperature 98.2 F (36.8 C), temperature source Oral, resp. rate 16, height _0  (1.727 m), weight 73.5 kg, SpO2 94 %. Physical Exam  Constitutional: She appears well-developed and well-nourished. No distress.  HENT:  Head: Normocephalic and atraumatic.  Eyes: Conjunctivae are normal. Right eye exhibits no discharge. Left eye exhibits no discharge. No scleral icterus.  Cardiovascular: Normal rate and regular rhythm.   Respiratory: Effort normal. No respiratory distress.  Musculoskeletal:     Cervical back: Normal range of motion.     Comments: UEx shoulder, elbow, wrist, digits- no skin wounds, erythema, edema hand, worst at base of thumb, superficial fluctuance notes around base of thumb, no instability, difficulty with thumb opposition and flexion  Sens  Ax/R/M/U intact  Mot   Ax/ R/ PIN/ M/ AIN/ U intact  Rad 2+  Neurological: She is alert.  Skin: Skin is warm and dry. She is not diaphoretic.  Psychiatric: She has a normal mood and affect. Her behavior is normal.    Assessment/Plan: Right hand infection -- Plan I&D by Dr. Jeannie Fend this afternoon. Please keep NPO. Multiple medical problems including chronic atrial fibrillation not on anticoagulation, adeno CA right lung, history of bronchitis, history of cervical CA, nonobstructive CAD, type 2 diabetes mellitus, and hypertension -- per primary service    Lisette Abu, PA-C Orthopedic Surgery [phone] 04/04/2019, 10:48 AM

## 2019-04-04 NOTE — Anesthesia Procedure Notes (Signed)
Date/Time: 04/04/2019 6:03 PM Performed by: Cynda Familia, CRNA Oxygen Delivery Method: Simple face mask Placement Confirmation: positive ETCO2 and breath sounds checked- equal and bilateral Dental Injury: Teeth and Oropharynx as per pre-operative assessment

## 2019-04-04 NOTE — Transfer of Care (Signed)
Immediate Anesthesia Transfer of Care Note  Patient: Nicole [name]  Procedure(s) Performed: IRRIGATION AND DEBRIDEMENT RIGHT HAND (Right )  Patient Location: PACU  Anesthesia Type:General  Level of Consciousness: sedated  Airway & Oxygen Therapy: Patient Spontanous Breathing and Patient connected to face mask oxygen  Post-op Assessment: Report given to RN and Post -op Vital signs reviewed and stable  Post vital signs: Reviewed and stable  Last Vitals:  Vitals Value Taken Time  BP 133/84 04/04/19 1811  Temp    Pulse 63 04/04/19 1812  Resp 14 04/04/19 1812  SpO2 100 % 04/04/19 1812  Vitals shown include unvalidated device data.  Last Pain:  Vitals:   04/04/19 1601  TempSrc: Oral  PainSc:       Patients Stated Pain Goal: 0 (75/79/72 8206)  Complications: No apparent anesthesia complications

## 2019-04-04 NOTE — Op Note (Signed)
PREOPERATIVE DIAGNOSIS: Right thumb infection  POSTOPERATIVE DIAGNOSIS: Same  ATTENDING PHYSICIAN: Maudry Mayhew. Jeannie Fend, III, MD who was present and scrubbed for the entire case   ASSISTANT SURGEON: None.   ANESTHESIA: General  SURGICAL PROCEDURES: 1.  Irrigation and debridement of right first webspace, deep abscess with both dorsal and palmar incisions 2.  Irrigation debridement of thumb flexor tendon sheath  SURGICAL INDICATIONS: Patient is a 79 year old female who sustained a puncture wound earlier this week.  She had significant erythema and swelling which developed rapidly after the puncture.  She was subsequently admitted to the hospital earlier this week where she was started on IV antibiotics.  She had continued swelling as well as the development of what appeared to be superficial abscess to the thumb.  Hand surgery was consulted at that point.  On exam she did have significant swelling and tenderness throughout both the palmar and dorsal aspects of the thumb which did extend into the first webspace.  I did recommend proceeding forward with irrigation debridement of the right thumb and she did wish to proceed  FINDING: There was a large abscess, deep within the first webspace between the thumb and the index finger metacarpal.  Abundant purulent material was expressed and drained.  There was some necrosis visualized of the muscles within the first webspace.  Additionally there was some purulence around the flexor tendons to the thumb.  Successful I&D was performed and a Penrose drain was placed.  DESCRIPTION OF PROCEDURE: Patient was identified in the preoperative holding area where the risk benefits and alternatives of the procedure were discussed with the patient.  These include but are not limited to continued infection, bleeding, damage to surrounding structures including blood vessels and nerves, pain, stiffness, need for additional procedures.  Informed consent was obtained at that time  the patient's right hand was marked with a surgical marking pen.  She was then brought back to the operative suite where timeout was performed identifying the correct patient operative site.  She was positioned supine on the operative table with her hand outstretched on a hand table.  She was induced under general anesthesia.  A tourniquet is placed on the upper arm and the arm was then prepped and draped in usual sterile fashion.  The limb was exsanguinated by gravity and the tourniquet was inflated.  We began with a longitudinal incision over the dorsal aspect of the thumb MP joint.  Blunt dissection was carried down through subcutaneous tissues towards the first webspace.  In doing so there was a large blush of purulent material which was cultured for both aerobic and anaerobic bacteria.  This abscess was expressed extensively with an abundant amount of purulent material flowing from the dorsal incision.  It did track and continued along the palmar aspect of the thumb through the first webspace.  At this point a Alyse Low type incision was made along the palmar aspect of the thumb.  Blunt dissection was carried down through the subcutaneous tissues.  Both the radial and ulnar digital nerves to the thumb were identified and protected with retractors.  There was further purulent material along the palmar aspect of the thumb and did extend down and surround the FPL tendon.  The A1 pulley was vented to allow for full visualization of the FPL tendon.  The thumb IP joint was flexed and extended and no further purulence was expressed within the tendon sheath.  The abscess did track down to the musculature within the first webspace.  This entire  area was thoroughly debrided with curette and rondure.  The wounds were then copiously irrigated with 3 L of normal saline by cystoscopy tubing.  Penrose drain was passed through the first webspace exiting both the palmar and dorsal incisions and the incisions were loosely closed  with 4-0 Prolene sutures.  Half percent bupivacaine was then injected around the base of the thumb for some postoperative pain relief.  Xeroform, 4 x 4's and a well-padded soft dressing were then applied.  The tourniquet was released and the patient had return of brisk capillary refill to her thumb and remaining digits.  She was awoken from her anesthesia and extubated in the operating room without any complications.  She was taken to the PACU in stable condition.  ESTIMATED BLOOD LOSS: 20 mL  TOURNIQUET TIME: 30 minutes  SPECIMENS: Aerobic and anaerobic cultures  POSTOPERATIVE PLAN: Patient will be transferred back up to the floor for continued service under the hospitalist service.  We will follow-up her cultures and plan to pull her Penrose drain in approximately 1 to 2 days.  We will get her set up with occupational therapy for some whirlpool treatments as well as continued wound care.  She can start range of motion of her thumb as tolerated.  IMPLANTS: None

## 2019-04-04 NOTE — Anesthesia Preprocedure Evaluation (Addendum)
Anesthesia Evaluation  Patient identified by MRN, date of birth, ID band Patient awake    Reviewed: Allergy & Precautions, NPO status , Patient's Chart, lab work & pertinent test results  Airway Mallampati: II  TM Distance: >3 FB Neck ROM: Full    Dental no notable dental hx.    Pulmonary  H/O Lung ca   Pulmonary exam normal breath sounds clear to auscultation       Cardiovascular hypertension, Normal cardiovascular exam Rhythm:Regular Rate:Normal  EF normal   Neuro/Psych negative neurological ROS  negative psych ROS   GI/Hepatic negative GI ROS, Neg liver ROS,   Endo/Other  diabetes  Renal/GU negative Renal ROS  negative genitourinary   Musculoskeletal negative musculoskeletal ROS (+)   Abdominal   Peds negative pediatric ROS (+)  Hematology negative hematology ROS (+)   Anesthesia Other Findings   Reproductive/Obstetrics negative OB ROS                            Anesthesia Physical Anesthesia Plan  ASA: III  Anesthesia Plan: General   Post-op Pain Management:    Induction: Intravenous  PONV Risk Score and Plan: 3 and Ondansetron, Dexamethasone and Treatment may vary due to age or medical condition  Airway Management Planned: LMA  Additional Equipment:   Intra-op Plan:   Post-operative Plan: Extubation in OR  Informed Consent: I have reviewed the patients History and Physical, chart, labs and discussed the procedure including the risks, benefits and alternatives for the proposed anesthesia with the patient or authorized representative who has indicated his/her understanding and acceptance.     Dental advisory given  Plan Discussed with: CRNA and Surgeon  Anesthesia Plan Comments:         Anesthesia Quick Evaluation

## 2019-04-04 NOTE — Care Management Important Message (Signed)
Important Message  Patient Details IM Letter given to Roque Lias SW Case Manager to present to the Patient Name: Nicole [name] MRN: [id number] Date of Birth: [birth year]   Medicare Important Message Given:  Yes     Kerin Salen 04/04/2019, 11:18 AM

## 2019-04-05 DIAGNOSIS — E781 Pure hyperglyceridemia: Secondary | ICD-10-CM

## 2019-04-05 LAB — CBC
HCT: 45.7 % (ref 36.0–46.0)
Hemoglobin: 14.7 g/dL (ref 12.0–15.0)
MCH: 31.1 pg (ref 26.0–34.0)
MCHC: 32.2 g/dL (ref 30.0–36.0)
MCV: 96.6 fL (ref 80.0–100.0)
Platelets: 224 10*3/uL (ref 150–400)
RBC: 4.73 MIL/uL (ref 3.87–5.11)
RDW: 14.2 % (ref 11.5–15.5)
WBC: 14.2 10*3/uL — ABNORMAL HIGH (ref 4.0–10.5)
nRBC: 0 % (ref 0.0–0.2)

## 2019-04-05 LAB — GLUCOSE, CAPILLARY
Glucose-Capillary: 258 mg/dL — ABNORMAL HIGH (ref 70–99)
Glucose-Capillary: 268 mg/dL — ABNORMAL HIGH (ref 70–99)
Glucose-Capillary: 299 mg/dL — ABNORMAL HIGH (ref 70–99)

## 2019-04-05 LAB — BASIC METABOLIC PANEL
Anion gap: 12 (ref 5–15)
BUN: 30 mg/dL — ABNORMAL HIGH (ref 8–23)
CO2: 22 mmol/L (ref 22–32)
Calcium: 8.5 mg/dL — ABNORMAL LOW (ref 8.9–10.3)
Chloride: 101 mmol/L (ref 98–111)
Creatinine, Ser: 1.1 mg/dL — ABNORMAL HIGH (ref 0.44–1.00)
GFR calc Af Amer: 56 mL/min — ABNORMAL LOW (ref >60)
GFR calc non Af Amer: 49 mL/min — ABNORMAL LOW (ref >60)
Glucose, Bld: 326 mg/dL — ABNORMAL HIGH (ref 70–99)
Potassium: 4.3 mmol/L (ref 3.5–5.1)
Sodium: 135 mmol/L (ref 135–145)

## 2019-04-05 MED ORDER — INSULIN GLARGINE 100 UNIT/ML ~~LOC~~ SOLN
10.0000 [IU] | Freq: Every day | SUBCUTANEOUS | Status: DC
  Administered 2019-04-05: 10 [IU] via SUBCUTANEOUS
  Filled 2019-04-05: qty 0.1

## 2019-04-05 MED ORDER — DIGOXIN 0.25 MG/ML IJ SOLN
0.2500 mg | Freq: Every day | INTRAMUSCULAR | Status: AC
  Administered 2019-04-05: 0.25 mg via INTRAVENOUS
  Filled 2019-04-05 (×2): qty 1

## 2019-04-05 MED ORDER — IRBESARTAN 300 MG PO TABS: 300.0000 mg | ORAL_TABLET | Freq: Every day | ORAL | 1 refills | Status: DC

## 2019-04-05 MED ORDER — AMOXICILLIN-POT CLAVULANATE 875-125 MG PO TABS: 1.0000 | ORAL_TABLET | Freq: Two times a day (BID) | ORAL | 0 refills | Status: AC

## 2019-04-05 MED ORDER — DOXYCYCLINE HYCLATE 100 MG PO TABS
100.0000 mg | ORAL_TABLET | Freq: Two times a day (BID) | ORAL | Status: DC
  Administered 2019-04-05: 100 mg via ORAL
  Filled 2019-04-05: qty 1

## 2019-04-05 MED ORDER — DOXYCYCLINE HYCLATE 100 MG PO TABS: 100.0000 mg | ORAL_TABLET | Freq: Two times a day (BID) | ORAL | 0 refills | Status: AC

## 2019-04-05 MED ORDER — TRAMADOL HCL 50 MG PO TABS: 50.0000 mg | ORAL_TABLET | Freq: Three times a day (TID) | ORAL | 0 refills | Status: DC | PRN

## 2019-04-05 NOTE — Progress Notes (Signed)
Pt HR up to 140s. Non sustaining. Afib Hx. Now 115 on monitor. HR goes up when pt go to BR. Yellow MEWS. BP 104/65. Asymptomatic. On call notified. No new orders.  MEWS/VS Documentation      04/04/2019 2150 04/04/2019 2154 04/04/2019 2213 04/05/2019 0050   MEWS Score:  3  3  2  2    MEWS Score Color:  Yellow  Yellow  Yellow  Yellow   Resp:  --  --  --  19   Pulse:  --  --  --  (!) 116   BP:  --  --  --  104/65   Temp:  --  --  --  98.3 F (36.8 C)        Kadan Millstein S Jorel Gravlin 04/05/2019,1:13 AM

## 2019-04-05 NOTE — Plan of Care (Signed)
  Problem: Education: Goal: Knowledge of General Education information will improve Description: Including pain rating scale, medication(s)/side effects and non-pharmacologic comfort measures Outcome: Adequate for Discharge   

## 2019-04-05 NOTE — Progress Notes (Signed)
[facility]                                                                              Patient Demographics  Nicole [name], is a 79 y.o. female, DOB - 05/27/1940, BEE:[id number]  Admit date - 04/02/2019   Admitting Physician Reubin Milan, MD  Outpatient Primary MD for the patient is [name], MD  Outpatient specialists:   LOS - 3  days   Medical records reviewed and are as summarized below:    Chief Complaint  Patient presents with  . hand infection       Brief summary   Patient is a 79 year old female with a history of chronic atrial fibrillation, adeno CA right lung, history of bronchitis, history of cervical CA, nonobstructive CAD, type 2 diabetes mellitus, hypertension presented with right hand swelling tenderness and redness for the past 3 days with an episode of fever on the day of admission.  She also reported chills for few days.  Reported she had a splinter in her right thumb, did not remember how she got it, removed it with some sterilized tweezers and needle but after that she started whelping pain, erythema and edema on the hand.  She called her PCP, Dr. Wynetta Emery who recommended her to go to ED.  She also reported lower extremity edema and had been taking furosemide for the past 2 days. In ED, temp 97.8, pulse 111, white count 13.2, BP 94/56  Assessment & Plan    Principal Problem: Sepsis secondary to cellulitis of right hand -Patient met sepsis criteria at the time of admission due to tachycardia, leukocytosis, hypotension, source likely due to cellulitis of the right hand -  Soft tissue ultrasound right hand showed no focal abscess. -Appreciate hand surgery assistance, underwent I&D of the right first webspace, deep abscess and irrigation, debridement of thumb flexor tendon sheath -Cultures growing GPC, will follow sensitivities -Discussed with pharmacy, continue IV Rocephin, added doxycycline -Per surgery op note, plan to pull her  Penrose drain in one or 2 days, follow cultures.  Will follow general surgery recommendations.  Active Problems:  Diabetes mellitus type 2, uncontrolled with hyperglycemia -Hold outpatient oral hypoglycemics, -Hemoglobin A1c 8.6  -CBGs uncontrolled, in 300s, added Lantus 10 units daily, continue NovoLog meal coverage 3 units 3 times daily AC, continue sliding scale insulin     Paroxysmal atrial fibrillation with RVR (HCC) Overnight patient noted to be in RVR, atrial fibrillation -Received IV digoxin x1, continue Cardizem, as needed metoprolol -Heart rate now improving -Continue apixaban    Essential hypertension -BP soft, continue to hold Avapro -Continue Cardizem    Hypertriglyceridemia -Continue Crestor    Coronary artery disease -Continue apixaban, Crestor  Code Status: Full CODE STATUS DVT Prophylaxis: Apixaban Family Communication: Discussed all imaging results, lab results, explained to the patient   Disposition Plan: Patient from home, anticipated discharge home when cleared by hand surgery.  Following cultures, growing GPC   Time Spent in minutes 25 minutes  Procedures:  SURGICAL PROCEDURES: 1.  Irrigation and debridement of right first webspace, deep abscess with both dorsal and palmar incisions 2.  Irrigation debridement of  thumb flexor tendon sheath  Consultants:   Hand surgery  Antimicrobials:   Anti-infectives (From admission, onward)   Start     Dose/Rate Route Frequency Ordered Stop   04/05/19 1000  doxycycline (VIBRA-TABS) tablet 100 mg     100 mg Oral Every 12 hours 04/05/19 0922     04/04/19 1545  ceFAZolin (ANCEF) IVPB 2g/100 mL premix     2 g 200 mL/hr over 30 Minutes Intravenous On call to O.R. 04/04/19 1527 04/04/19 1714   04/02/19 1645  cefTRIAXone (ROCEPHIN) 1 g in sodium chloride 0.9 % 100 mL IVPB     1 g 200 mL/hr over 30 Minutes Intravenous Every 24 hours 04/02/19 1631     04/02/19 1615  piperacillin-tazobactam (ZOSYN) IVPB 3.375 g   Status:  Discontinued     3.375 g 100 mL/hr over 30 Minutes Intravenous  Once 04/02/19 1557 04/02/19 1631   04/02/19 1615  vancomycin (VANCOCIN) IVPB 1000 mg/200 mL premix  Status:  Discontinued     1,000 mg 200 mL/hr over 60 Minutes Intravenous  Once 04/02/19 1557 04/02/19 1631   04/02/19 1330  ceFAZolin (ANCEF) IVPB 1 g/50 mL premix     1 g 100 mL/hr over 30 Minutes Intravenous  Once 04/02/19 1319 04/02/19 1440         Medications  Scheduled Meds: . apixaban  5 mg Oral BID  . diltiazem  360 mg Oral Daily  . doxycycline  100 mg Oral Q12H  . hydrocortisone  1 application Rectal TID  . insulin aspart  0-15 Units Subcutaneous TID WC  . insulin aspart  0-5 Units Subcutaneous QHS  . insulin aspart  3 Units Subcutaneous TID WC  . insulin glargine  10 Units Subcutaneous Daily  .  morphine injection  2 mg Intravenous Once  . ondansetron (ZOFRAN) IV  4 mg Intravenous Once  . rosuvastatin  5 mg Oral Daily   Continuous Infusions: . cefTRIAXone (ROCEPHIN)  IV 1 g (04/04/19 1613)   PRN Meds:.HYDROcodone-acetaminophen      Subjective:   Nicole [name] was seen and examined today.  I&D on 3/11, feels somewhat better today, dressing intact.  No fevers.  Overnight issues noted, in rapid A. fib with RVR.  Heart rate now improving.  Patient denies dizziness, chest pain, shortness of breath, abdominal pain, N/V/D/C, new weakness, numbess, tingling.     Objective:   Vitals:   04/05/19 0258 04/05/19 0300 04/05/19 0605 04/05/19 1100  BP: 109/72  111/79 106/70  Pulse: (!) 119 (!) 106 79 89  Resp: _0 Temp: 97.8 F (36.6 C)  98.2 F (36.8 C) 97.9 F (36.6 C)  TempSrc:   Oral Oral  SpO2: 97%  98% 98%  Weight:      Height:        Intake/Output Summary (Last 24 hours) at 04/05/2019 1136 Last data filed at 04/05/2019 1100 Gross per 24 hour  Intake 1930 ml  Output 20 ml  Net 1910 ml     Wt Readings from Last 3 Encounters:  04/04/19 73.5 kg  04/02/19 73.5 kg  04/02/19  73.5 kg   Physical Exam  General: Alert and oriented x 3, NAD  Cardiovascular: Tachycardia, irregularly irregular  Respiratory: CTAB, no wheezing, rales or rhonchi  Gastrointestinal: Soft, nontender, nondistended, NBS  Ext: no pedal edema bilaterally  Neuro: no new deficits  Musculoskeletal: No cyanosis, clubbing  Skin: Right hand dressing intact  Psych: Normal affect and demeanor, alert and oriented x3  RIGHT HAND SWELLING,CELLULITIS          Data Reviewed:  I have personally reviewed following labs and imaging studies  Micro Results Recent Results (from the past 240 hour(s))  Blood Culture (routine x 2)     Status: None (Preliminary result)   Collection Time: 04/02/19  1:18 PM   Specimen: BLOOD LEFT FOREARM  Result Value Ref Range Status   Specimen Description   Final    BLOOD LEFT FOREARM Performed at [facility], [place] [address], [place], [place] [address]    Special Requests   Final    BOTTLES DRAWN AEROBIC AND ANAEROBIC Blood Culture adequate volume Performed at [facility] [address], [place], [place] [address]    Culture   Final    NO GROWTH 3 DAYS Performed at [facility], [place] [address], [place], [place] [address]    Report Status PENDING  Incomplete  Blood Culture (routine x 2)     Status: None (Preliminary result)   Collection Time: 04/02/19  2:08 PM   Specimen: BLOOD  Result Value Ref Range Status   Specimen Description   Final    BLOOD RIGHT ANTECUBITAL Performed at [facility] [address], [place], [place] [address]    Special Requests   Final    BOTTLES DRAWN AEROBIC AND ANAEROBIC Blood Culture adequate volume Performed at [facility] [address], [place], [place] [address]    Culture   Final    NO GROWTH 3 DAYS Performed at [facility], [place] [address], [place], [place] [address]    Report Status PENDING  Incomplete  SARS  CORONAVIRUS 2 (TAT 6-24 HRS) Nasopharyngeal Nasopharyngeal Swab     Status: None   Collection Time: 04/02/19  4:29 PM   Specimen: Nasopharyngeal Swab  Result Value Ref Range Status   SARS Coronavirus 2 NEGATIVE NEGATIVE Final    Comment: (NOTE) SARS-CoV-2 target nucleic acids are NOT DETECTED. The SARS-CoV-2 RNA is generally detectable in upper and lower respiratory specimens during the acute phase of infection. Negative results do not preclude SARS-CoV-2 infection, do not rule out co-infections with other pathogens, and should not be used as the sole basis for treatment or other patient management decisions. Negative results must be combined with clinical observations, patient history, and epidemiological information. The expected result is Negative. Fact Sheet for Patients: SugarRoll.be Fact Sheet for Healthcare Providers: https://www.woods-mathews.com/ This test is not yet approved or cleared by the [place] FDA and  has been authorized for detection and/or diagnosis of SARS-CoV-2 by FDA under an Emergency Use Authorization (EUA). This EUA will remain  in effect (meaning this test can be used) for the duration of the COVID-19 declaration under Section 56 4(b)(1) of the Act, 21 U.S.C. section 360bbb-3(b)(1), unless the authorization is terminated or revoked sooner. Performed at [facility], [place] [address], [place], [place] [address]   MRSA PCR Screening     Status: Abnormal   Collection Time: 04/04/19 12:03 PM   Specimen: Nasal Mucosa; Nasopharyngeal  Result Value Ref Range Status   MRSA by PCR POSITIVE (A) NEGATIVE Final    Comment:        The GeneXpert MRSA Assay (FDA approved for NASAL specimens only), is one component of a comprehensive MRSA colonization surveillance program. It is not intended to diagnose MRSA infection nor to guide or monitor treatment for MRSA infections. RESULT CALLED TO, READ BACK BY AND  VERIFIED WITH: COX,D. RN _0  ON 03.11.2021 BY COHEN,K Performed at [facility], [place] [address] [place], [place] [address]   Anaerobic culture     Status: None (Preliminary result)   Collection Time: 04/04/19  5:37 PM   Specimen: Hand, Right; Wound  Result Value Ref Range Status   Specimen Description   Final    HAND RIGHT Performed at [facility] [address] [place], [place] [address]    Special Requests   Final    NONE Performed at [facility], [place] [address], [place], [place] [address]    Gram Stain   Final    ABUNDANT WBC PRESENT, PREDOMINANTLY PMN FEW GRAM POSITIVE COCCI Performed at [facility], [place] [address] [place], [place] [address]    Culture PENDING  Incomplete   Report Status PENDING  Incomplete  Aerobic Culture (superficial specimen)     Status: None (Preliminary result)   Collection Time: 04/04/19  5:37 PM   Specimen: Hand, Right; Wound  Result Value Ref Range Status   Specimen Description   Final    HAND RIGHT Performed at [facility] [address], [place], [place] [address]    Special Requests   Final    NONE Performed at [facility], [place] [address], [place], [place] [address]    Gram Stain   Final    ABUNDANT WBC PRESENT, PREDOMINANTLY PMN MODERATE GRAM POSITIVE COCCI    Culture   Final    FEW STAPHYLOCOCCUS AUREUS SUSCEPTIBILITIES TO FOLLOW Performed at [facility], [place] [address], [place],  [address]    Report Status PENDING  Incomplete    Radiology Reports DG Hand Complete Right  Result Date: 04/02/2019 CLINICAL DATA:  Cellulitis for 5 days. EXAM: RIGHT HAND - COMPLETE 3+ VIEW COMPARISON:  Contralateral wrist from 2015 FINDINGS: Soft tissue swelling about the hand and wrist. Signs of prior trauma to the ulna. Degenerative changes particularly at first [facility] and about the interphalangeal joints. No signs of destructive  bone process. Well-circumscribed lucency in the scaphoid may represent a carpal cyst or intraosseous lipoma. IMPRESSION: Soft tissue swelling about the hand and wrist. No signs of destructive bone process. If there is concern for osteomyelitis MRI may be helpful. Electronically Signed   By: Zetta Bills M.D.   On: 04/02/2019 10:23   [place] RT UPPER EXTREM LTD SOFT TISSUE NON VASCULAR  Result Date: 04/03/2019 CLINICAL DATA:  Right hand cellulitis with swelling for 6 days. EXAM: ULTRASOUND RIGHT UPPER EXTREMITY LIMITED TECHNIQUE: Ultrasound examination of the upper extremity soft tissues was performed in the area of clinical concern. COMPARISON:  Right hand radiographs 04/02/2019 FINDINGS: Ultrasound targeted to the dorsum of the right hand was performed. The soft tissues are swollen and edematous. No focal fluid collection or mass lesion identified. No evidence of foreign body or significant tenosynovitis. IMPRESSION: 1. No focal fluid collection is seen in the area of concern to suggest an abscess. The soft tissues are diffusely swollen and edematous. 2. Consider further evaluation with CT or MRI as clinically warranted. Electronically Signed   By: Richardean Sale M.D.   On: 04/03/2019 13:41   VAS [place] LOWER EXTREMITY VENOUS (DVT)  Result Date: 03/29/2019  Lower Venous DVTStudy Indications: Swelling.  Comparison Study: no prior Performing Technologist: Abram Sander RVS  Examination Guidelines: A complete evaluation includes B-mode imaging, spectral Doppler, color Doppler, and power Doppler as needed of all accessible portions  of each vessel. Bilateral testing is considered an integral part of a complete examination. Limited examinations for reoccurring indications may be performed as noted. The reflux portion of the exam is performed with the patient in reverse Trendelenburg.  +-----+---------------+---------+-----------+----------+--------------+ RIGHTCompressibilityPhasicitySpontaneityPropertiesThrombus  Aging +-----+---------------+---------+-----------+----------+--------------+ CFV  Full           Yes      Yes                                 +-----+---------------+---------+-----------+----------+--------------+   +---------+---------------+---------+-----------+----------+--------------+ LEFT     CompressibilityPhasicitySpontaneityPropertiesThrombus Aging +---------+---------------+---------+-----------+----------+--------------+ CFV      Full           Yes      Yes                                 +---------+---------------+---------+-----------+----------+--------------+ SFJ      Full                                                        +---------+---------------+---------+-----------+----------+--------------+ FV Prox  Full                                                        +---------+---------------+---------+-----------+----------+--------------+ FV Mid   Full                                                        +---------+---------------+---------+-----------+----------+--------------+ FV DistalFull                                                        +---------+---------------+---------+-----------+----------+--------------+ PFV      Full                                                        +---------+---------------+---------+-----------+----------+--------------+ POP      Full           Yes      Yes                                 +---------+---------------+---------+-----------+----------+--------------+ PTV      Full                                                        +---------+---------------+---------+-----------+----------+--------------+ PERO  Not visualized +---------+---------------+---------+-----------+----------+--------------+     Summary: RIGHT: - No evidence of common femoral vein obstruction.  LEFT: - There is no evidence of deep vein thrombosis in  the lower extremity.  - No cystic structure found in the popliteal fossa.  *See table(s) above for measurements and observations. Electronically signed by Monica Martinez MD on 03/29/2019 at 4:44:34 PM.    Final     Lab Data:  CBC: Recent Labs  Lab 04/02/19 1002 04/04/19 0533 04/05/19 0509  WBC 13.2* 11.7* 14.2*  NEUTROABS 12.1*  --   --   HGB 15.2* 13.7 14.7  HCT 45.0 43.2 45.7  MCV 92.7 98.4 96.6  PLT 216.0 189 078   Basic Metabolic Panel: Recent Labs  Lab 04/02/19 1002 04/02/19 1313 04/04/19 0533 04/05/19 0509  NA 131* 135 135 135  K 4.0 4.3 4.3 4.3  CL 99 102 103 101  CO2 _0 GLUCOSE 302* 474* 107* 326*  BUN 31* 36* 29* 30*  CREATININE 1.05 1.17* 0.91 1.10*  CALCIUM 9.1 8.6* 8.6* 8.5*   GFR: Estimated Creatinine Clearance: 43.9 mL/min (A) (by C-G formula based on SCr of 1.1 mg/dL (H)). Liver Function Tests: Recent Labs  Lab 04/02/19 1313  AST 15  ALT 24  ALKPHOS 54  BILITOT 0.9  PROT 5.9*  ALBUMIN 2.9*   No results for input(s): LIPASE, AMYLASE in the last 168 hours. No results for input(s): AMMONIA in the last 168 hours. Coagulation Profile: Recent Labs  Lab 04/02/19 1313  INR 1.1   Cardiac Enzymes: No results for input(s): CKTOTAL, CKMB, CKMBINDEX, TROPONINI in the last 168 hours. BNP (last 3 results) No results for input(s): PROBNP in the last 8760 hours. HbA1C: Recent Labs    04/04/19 0533  HGBA1C 8.6*   CBG: Recent Labs  Lab 04/04/19 1255 04/04/19 1552 04/04/19 1820 04/04/19 2045 04/05/19 0755  GLUCAP 117* 105* 103* 179* 258*   Lipid Profile: No results for input(s): CHOL, HDL, LDLCALC, TRIG, CHOLHDL, LDLDIRECT in the last 72 hours. Thyroid Function Tests: No results for input(s): TSH, T4TOTAL, FREET4, T3FREE, THYROIDAB in the last 72 hours. Anemia Panel: No results for input(s): VITAMINB12, FOLATE, FERRITIN, TIBC, IRON, RETICCTPCT in the last 72 hours. Urine analysis:    Component Value Date/Time   COLORURINE  YELLOW 07/29/2018 1445   APPEARANCEUR HAZY (A) 07/29/2018 1445   LABSPEC 1.010 07/29/2018 1445   PHURINE 7.0 07/29/2018 1445   GLUCOSEU >=500 (A) 07/29/2018 1445   GLUCOSEU NEGATIVE 07/18/2018 1549   HGBUR NEGATIVE 07/29/2018 1445   HGBUR large 12/16/2009 1024   BILIRUBINUR NEGATIVE 07/29/2018 1445   BILIRUBINUR negative 11/15/2017 1041   KETONESUR NEGATIVE 07/29/2018 1445   PROTEINUR NEGATIVE 07/29/2018 1445   UROBILINOGEN 0.2 07/18/2018 1549   NITRITE NEGATIVE 07/29/2018 1445   LEUKOCYTESUR NEGATIVE 07/29/2018 1445     Kejon Feild M.D. [facility] 04/05/2019, 11:36 AM   Call night coverage person covering after 7pm

## 2019-04-05 NOTE — Discharge Summary (Signed)
Physician Discharge Summary   Patient ID: Nicole [name] MRN: [id number] DOB/AGE: [birth year] 79 y.o.  Admit date: 04/02/2019 Discharge date: 04/05/2019  Primary Care Physician:  [name], MD   Recommendations for Outpatient Follow-up:  1. Follow up with Dr Jeannie Fend in 1 week 2. Continue doxycycline 154m BID and Augmentin 1 tab bid for 10 days  3. Daily dressing changes as per hand surgery recommendations  4. BP soft during hospitalization , recommended to check BP daily and hold avapro  Home Health: None  Equipment/Devices:   Discharge Condition: stable  CODE STATUS: FULL  Diet recommendation: carb modified    Discharge Diagnoses:    . Cellulitis and abscess of right hand . Essential hypertension . Coronary artery disease . Hypertriglyceridemia . Hyperlipidemia with target LDL less than 100 . Paroxysmal atrial fibrillation with RVR ([facility]   Consults:  Hand surgery, Dr CJeannie Fend    Allergies:   Allergies  Allergen Reactions  . No Known Allergies      DISCHARGE MEDICATIONS: Allergies as of 04/05/2019      Reactions   No Known Allergies       Medication List    TAKE these medications   accu-chek soft touch lancets Use to check blood sugars daily Dx E11.9   amoxicillin-clavulanate 875-125 MG tablet Commonly known as: Augmentin Take 1 tablet by mouth 2 (two) times daily for 10 days.   b complex vitamins tablet Take 1 tablet by mouth daily.   calcium-vitamin D 500-200 MG-UNIT tablet Commonly known as: OSCAL WITH D Take 2 tablets by mouth daily with breakfast.   Collagenase Powd by Does not apply route daily.   Contour Next Test test strip Generic drug: glucose blood USE 1 STRIP TO CHECK GLUCOSE TWICE DAILY   diltiazem 360 MG 24 hr capsule Commonly known as: Cardizem CD Take 1 capsule (360 mg total) by mouth daily.   doxycycline 100 MG tablet Commonly known as: VIBRA-TABS Take 1 tablet (100 mg total) by mouth 2 (two) times daily for 10  days.   Eliquis 5 MG Tabs tablet Generic drug: apixaban Take 5 mg by mouth 2 (two) times daily.   Farxiga 10 MG Tabs tablet Generic drug: dapagliflozin propanediol Take 10 mg by mouth daily.   furosemide 20 MG tablet Commonly known as: LASIX TAKE 1 TABLET DAILY AS NEEDED FOR FLUID. CAN TAKE 2ND TABLET IN AFTERNOON AS NEEDED What changed:   how much to take  how to take this  when to take this  additional instructions   hydrocortisone 2.5 % rectal cream Commonly known as: ANUSOL-HC Place 1 application rectally 3 (three) times daily. Use for 10 days.   irbesartan 300 MG tablet Commonly known as: AVAPRO Take 1 tablet (300 mg total) by mouth daily. Please HOLD and discuss with your PCP/ cardiologist to decrease dose What changed: additional instructions   metFORMIN 500 MG 24 hr tablet Commonly known as: GLUCOPHAGE-XR TAKE 2 TABLETS(1000 MG) BY MOUTH DAILY WITH BREAKFAST What changed: See the new instructions.   metoprolol tartrate 25 MG tablet Commonly known as: LOPRESSOR Take 1 tablet every 8 hours AS NEEDED for rapid heart rate What changed:   how much to take  how to take this  when to take this  additional instructions   MULTIPLE VITAMINS/WOMENS PO Take by mouth daily.   omega-3 acid ethyl esters 1 g capsule Commonly known as: LOVAZA Take 2 capsules (2 g total) by mouth 2 (two) times daily.   PREBIOTIC PRODUCT  PO Take 1 capsule by mouth daily.   rosuvastatin 5 MG tablet Commonly known as: CRESTOR Take 1 tablet (5 mg total) by mouth daily.   traMADol 50 MG tablet Commonly known as: Ultram Take 1 tablet (50 mg total) by mouth every 8 (eight) hours as needed for moderate pain or severe pain.   Turmeric 500 MG Caps Take 1,000 mg by mouth daily.        Brief H and P: For complete details please refer to admission H and P, but in brief *Patient is a 79 year old female with a history of chronic atrial fibrillation, adeno CA right lung, history of  bronchitis, history of cervical CA, nonobstructive CAD, type 2 diabetes mellitus, hypertension presented with right hand swelling tenderness and redness for the past 3 days with an episode of fever on the day of admission.  She also reported chills for few days.  Reported she had a splinter in her right thumb, did not remember how she got it, removed it with some sterilized tweezers and needle but after that she started whelping pain, erythema and edema on the hand.  She called her PCP, Dr. Wynetta Emery who recommended her to go to ED.  She also reported lower extremity edema and had been taking furosemide for the past 2 days. In ED, temp 97.8, pulse 111, white count 13.2, BP 94/56  Hospital Course:   Sepsis secondary to cellulitis, abscess of right hand -Patient met sepsis criteria at the time of admission due to tachycardia, leukocytosis, hypotension, source likely due to cellulitis of the right hand - Soft tissue ultrasound right hand showed no focal abscess but had tenderness with fluctuance at the base of the thumb. -  hand surgery was consulted, underwent I&D of the right first webspace, deep abscess and irrigation, debridement of thumb flexor tendon sheath by Dr Jeannie Fend, post op day #1 -Cultures growing GPC, patient was on IV rocephin, added  doxycycline - cleared by hand surgery for discharge, transitioned to augmentin and doxycycline for 10 days. Close follow-up with Dr Jeannie Fend next week.   Diabetes mellitus type 2, uncontrolled with hyperglycemia -resume outpatient oral hypoglycemics, needs close follow-up and adjustment of regimen. CBG's were elevated during hospitalization. -Hemoglobin A1c 8.6       Paroxysmal atrial fibrillation with RVR (HCC) Overnight patient noted to be in RVR, atrial fibrillation -Received IV digoxin x1, continue Cardizem, as needed metoprolol -Heart rate now improved, in NSR -Continue apixaban    Essential hypertension -BP soft, continue to hold  Avapro -Continue Cardizem    Hypertriglyceridemia -Continue Crestor    Coronary artery disease -Continue apixaban, Crestor  Day of Discharge S: Post op, doing better, pain better controlled. No fevers   BP 107/64 (BP Location: Left Arm)   Pulse 72   Temp 97.9 F (36.6 C) (Oral)   Resp 20   Ht _0  (1.727 m)   Wt 73.5 kg   SpO2 100%   BMI 24.64 kg/m   Physical Exam: General: Alert and awake oriented x3 not in any acute distress. HEENT: anicteric sclera, pupils reactive to light and accommodation CVS: S1-S2 clear no murmur rubs or gallops Chest: clear to auscultation bilaterally, no wheezing rales or rhonchi Abdomen: soft nontender, nondistended, normal bowel sounds Extremities: no cyanosis, clubbing or edema noted bilaterally. Right hand dressing intact  Neuro: Cranial nerves II-XII intact, no focal neurological deficits    Get Medicines reviewed and adjusted: Please take all your medications with you for your next visit with your  Primary MD  Please request your Primary MD to go over all hospital tests and procedure/radiological results at the follow up. Please ask your Primary MD to get all Hospital records sent to his/her office.  If you experience worsening of your admission symptoms, develop shortness of breath, life threatening emergency, suicidal or homicidal thoughts you must seek medical attention immediately by calling 911 or calling your MD immediately  if symptoms less severe.  You must read complete instructions/literature along with all the possible adverse reactions/side effects for all the Medicines you take and that have been prescribed to you. Take any new Medicines after you have completely understood and accept all the possible adverse reactions/side effects.   Do not drive when taking pain medications.   Do not take more than prescribed Pain, Sleep and Anxiety Medications  Special Instructions: If you have smoked or chewed Tobacco  in the last 2  yrs please stop smoking, stop any regular Alcohol  and or any Recreational drug use.  Wear Seat belts while driving.  Please note  You were cared for by a hospitalist during your hospital stay. Once you are discharged, your primary care physician will handle any further medical issues. Please note that NO REFILLS for any discharge medications will be authorized once you are discharged, as it is imperative that you return to your primary care physician (or establish a relationship with a primary care physician if you do not have one) for your aftercare needs so that they can reassess your need for medications and monitor your lab values.   The results of significant diagnostics from this hospitalization (including imaging, microbiology, ancillary and laboratory) are listed below for reference.      Procedures/Studies:  DG Hand Complete Right  Result Date: 04/02/2019 CLINICAL DATA:  Cellulitis for 5 days. EXAM: RIGHT HAND - COMPLETE 3+ VIEW COMPARISON:  Contralateral wrist from 2015 FINDINGS: Soft tissue swelling about the hand and wrist. Signs of prior trauma to the ulna. Degenerative changes particularly at first [facility] and about the interphalangeal joints. No signs of destructive bone process. Well-circumscribed lucency in the scaphoid may represent a carpal cyst or intraosseous lipoma. IMPRESSION: Soft tissue swelling about the hand and wrist. No signs of destructive bone process. If there is concern for osteomyelitis MRI may be helpful. Electronically Signed   By: Zetta Bills M.D.   On: 04/02/2019 10:23   [place] RT UPPER EXTREM LTD SOFT TISSUE NON VASCULAR  Result Date: 04/03/2019 CLINICAL DATA:  Right hand cellulitis with swelling for 6 days. EXAM: ULTRASOUND RIGHT UPPER EXTREMITY LIMITED TECHNIQUE: Ultrasound examination of the upper extremity soft tissues was performed in the area of clinical concern. COMPARISON:  Right hand radiographs 04/02/2019 FINDINGS: Ultrasound targeted to the dorsum of  the right hand was performed. The soft tissues are swollen and edematous. No focal fluid collection or mass lesion identified. No evidence of foreign body or significant tenosynovitis. IMPRESSION: 1. No focal fluid collection is seen in the area of concern to suggest an abscess. The soft tissues are diffusely swollen and edematous. 2. Consider further evaluation with CT or MRI as clinically warranted. Electronically Signed   By: Richardean Sale M.D.   On: 04/03/2019 13:41   VAS [place] LOWER EXTREMITY VENOUS (DVT)  Result Date: 03/29/2019  Lower Venous DVTStudy Indications: Swelling.  Comparison Study: no prior Performing Technologist: Abram Sander RVS  Examination Guidelines: A complete evaluation includes B-mode imaging, spectral Doppler, color Doppler, and power Doppler as needed of all accessible portions of  each vessel. Bilateral testing is considered an integral part of a complete examination. Limited examinations for reoccurring indications may be performed as noted. The reflux portion of the exam is performed with the patient in reverse Trendelenburg.  +-----+---------------+---------+-----------+----------+--------------+ RIGHTCompressibilityPhasicitySpontaneityPropertiesThrombus Aging +-----+---------------+---------+-----------+----------+--------------+ CFV  Full           Yes      Yes                                 +-----+---------------+---------+-----------+----------+--------------+   +---------+---------------+---------+-----------+----------+--------------+ LEFT     CompressibilityPhasicitySpontaneityPropertiesThrombus Aging +---------+---------------+---------+-----------+----------+--------------+ CFV      Full           Yes      Yes                                 +---------+---------------+---------+-----------+----------+--------------+ SFJ      Full                                                         +---------+---------------+---------+-----------+----------+--------------+ FV Prox  Full                                                        +---------+---------------+---------+-----------+----------+--------------+ FV Mid   Full                                                        +---------+---------------+---------+-----------+----------+--------------+ FV DistalFull                                                        +---------+---------------+---------+-----------+----------+--------------+ PFV      Full                                                        +---------+---------------+---------+-----------+----------+--------------+ POP      Full           Yes      Yes                                 +---------+---------------+---------+-----------+----------+--------------+ PTV      Full                                                        +---------+---------------+---------+-----------+----------+--------------+ PERO  Not visualized +---------+---------------+---------+-----------+----------+--------------+     Summary: RIGHT: - No evidence of common femoral vein obstruction.  LEFT: - There is no evidence of deep vein thrombosis in the lower extremity.  - No cystic structure found in the popliteal fossa.  *See table(s) above for measurements and observations. Electronically signed by Monica Martinez MD on 03/29/2019 at 4:44:34 PM.    Final        LAB RESULTS: Basic Metabolic Panel: Recent Labs  Lab 04/04/19 0533 04/05/19 0509  NA 135 135  K 4.3 4.3  CL 103 101  CO2 26 22  GLUCOSE 107* 326*  BUN 29* 30*  CREATININE 0.91 1.10*  CALCIUM 8.6* 8.5*   Liver Function Tests: Recent Labs  Lab 04/02/19 1313  AST 15  ALT 24  ALKPHOS 54  BILITOT 0.9  PROT 5.9*  ALBUMIN 2.9*   No results for input(s): LIPASE, AMYLASE in the last 168 hours. No results for input(s): AMMONIA in the  last 168 hours. CBC: Recent Labs  Lab 04/02/19 1002 04/02/19 1002 04/04/19 0533 04/04/19 0533 04/05/19 0509  WBC 13.2*   < > 11.7*  --  14.2*  NEUTROABS 12.1*  --   --   --   --   HGB 15.2*   < > 13.7  --  14.7  HCT 45.0   < > 43.2  --  45.7  MCV 92.7   < > 98.4   < > 96.6  PLT 216.0   < > 189  --  224   < > = values in this interval not displayed.   Cardiac Enzymes: No results for input(s): CKTOTAL, CKMB, CKMBINDEX, TROPONINI in the last 168 hours. BNP: Invalid input(s): POCBNP CBG: Recent Labs  Lab 04/05/19 1135 04/05/19 1710  GLUCAP 268* 299*       Disposition and Follow-up: Discharge Instructions    Diet Carb Modified   Complete by: As directed    Increase activity slowly   Complete by: As directed        DISPOSITION: home    [place]    Avanell Shackleton III, MD. Schedule an appointment as soon as possible for a visit in 1 week(s).   Why: please call on Monday for follow-up appointment in 1 week Contact information: [address] [place] [address] [phone]            Time coordinating discharge:  72mns   Signed:   REstill CottaM.D. [facility] 04/05/2019, 5:25 PM

## 2019-04-05 NOTE — Progress Notes (Signed)
Discharge instructions given with  Stated understanding.  Patient currently waiting for transportation home

## 2019-04-05 NOTE — Progress Notes (Signed)
Pt resting, asymptomatic. Digoxin ordered at [place]. No other orders. No c/o from pt. Will continue to monitor.    MEWS/VS Documentation      04/04/2019 2154 04/04/2019 2213 04/05/2019 0050 04/05/2019 0258   MEWS Score:  3  2  2  2    MEWS Score Color:  Yellow  Yellow  Yellow  Yellow   Resp:  --  --  19  18   Pulse:  --  --  (!) 116  (!) 119   BP:  --  --  104/65  109/72   Temp:  --  --  98.3 F (36.8 C)  97.8 F (36.6 C)

## 2019-04-05 NOTE — Progress Notes (Signed)
   Ortho Hand Progress Note  Subjective: No acute events from the hand standpoint last night. States that her hand feel relieved. Decreased pain today   Objective: Vital signs in last 24 hours: Temp:  [97.8 F (36.6 C)-98.3 F (36.8 C)] 97.9 F (36.6 C) (03/12 1100) Pulse Rate:  [71-119] 72 (03/12 1339) Resp:  [12-20] 20 (03/12 1339) BP: (104-135)/(64-84) 107/64 (03/12 1339) SpO2:  [94 %-100 %] 100 % (03/12 1339)  Intake/Output from previous day: 03/11 0701 - 03/12 0700 In: 1930 [P.O.:730; I.V.:1000; IV Piggyback:200] Out: 270 [Urine:250; Blood:20] Intake/Output this shift: Total I/O In: 250 [P.O.:250] Out: -   Recent Labs    04/04/19 0533 04/05/19 0509  HGB 13.7 14.7   Recent Labs    04/04/19 0533 04/05/19 0509  WBC 11.7* 14.2*  RBC 4.39 4.73  HCT 43.2 45.7  PLT 189 224   Recent Labs    04/04/19 0533 04/05/19 0509  NA 135 135  K 4.3 4.3  CL 103 101  CO2 26 22  BUN 29* 30*  CREATININE 0.91 1.10*  GLUCOSE 107* 326*  CALCIUM 8.6* 8.5*   No results for input(s): LABPT, INR in the last 72 hours.  Aaox3 nad Resp nonlabored RRR RUE: Decreased swelling and erythema to the hand and thumb from preop. Serosang drainage from both the dorsal and volar incisions. No purulent drainage. Intact thumb IP motion. Intact sensation to both the radial and ulnar aspects of the thumb. Thumb is wwp with bcr.   Assessment/Plan: Right hand/thumb infection s/p I&D, POD1  - Medicine primary. Appreciate management - Gram stain: GPCs - Cxs: staph - Dressings changed and drain pulled - Daily dressing changes  - Encourage regular digit ROM - Abx per primary   OK for d/c from hand standpoint. Close follow up with me next week in clinic   Avanell Shackleton III 04/05/2019, 5:10 PM  [phone]

## 2019-04-05 NOTE — Evaluation (Addendum)
Physical Therapy Evaluation Patient Details Name: Nicole [name] MRN: [id number] DOB: August 28, 1940 Today's Date: 04/05/2019   History of Present Illness  Patient is a 79 year old female with a history of chronic atrial fibrillation, adeno CA right lung, history of bronchitis, history of cervical CA, nonobstructive CAD, type 2 diabetes mellitus, hypertension presented with right hand swelling tenderness and redness for the past 3 days with an episode of fever on the day of admission.  She also reported chills for few days.  Reported she had a splinter in her right thumb, did not remember how she got it, removed it with some sterilized tweezers and needle but after that she started whelping pain, erythema and edema on the hand.  She called her PCP, Dr. Wynetta Emery who recommended her to go to ED.  She also reported lower extremity edema and had been taking furosemide for the past 2 days.    Clinical Impression  Pt admitted with above diagnosis. Pt limited by HR this session, denying all symptoms but reporting feeling "not myself". Treatment session tomorrow for stair training due to 5 steps to enter. HR increased to 160 upon standing and with steps at bedside, returned to 120 with return to sitting/supine. Pt currently with functional limitations due to the deficits listed below (see PT Problem List). Pt will benefit from skilled PT to increase their independence and safety with mobility to allow discharge to the venue listed below.       Follow Up Recommendations No PT follow up    Equipment Recommendations  None recommended by PT    Recommendations for Other Services       Precautions / Restrictions Precautions Precautions: Other (comment) Precaution Comments: Monitor HR Restrictions Weight Bearing Restrictions: No      Mobility  Bed Mobility Overal bed mobility: Modified Independent       Transfers Overall transfer level: Needs assistance   Transfers: Sit to/from Stand Sit to  Stand: Supervision    General transfer comment: verbal cues to push from EOB and not pull on Chinese walker, denies dizziness, headache, lightheadedness, or pain upon standing  Ambulation/Gait Ambulation/Gait assistance: Supervision Gait Distance (Feet): 6 Feet Assistive device: (Chinese walker (rolling suitcase)) Gait Pattern/deviations: Step-to pattern;Decreased step length - right;Decreased step length - left;Decreased stride length Gait velocity: decreased   General Gait Details: short, quick steps with increased lateral weight shifting (pt calls it a penguin walk), denies dizziness, headache, lightheadedness or pain with ambulation; HR increased to 160 bpm with steps at bedside so returned to sitting  Stairs            Wheelchair Mobility    Modified Rankin (Stroke Patients Only)       Balance Overall balance assessment: Modified Independent(with Chinese walker)              Pertinent Vitals/Pain Pain Assessment: No/denies pain    Home Living Family/patient expects to be discharged to:: Private residence Living Arrangements: Alone Available Help at Discharge: Family;Available PRN/intermittently Type of Home: House Home Access: Stairs to enter Entrance Stairs-Rails: Right;Left;Can reach both Entrance Stairs-Number of Steps: 5 Home Layout: One level Home Equipment: ("chinese walker" appears to be rolling suitcase)      Prior Function Level of Independence: Independent with assistive device(s)         Comments: Pt reports independent with ADLs, ambulates with chinese walker (appears to be rolling suitcase), drives, has children near by but prefers to be independent. 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     Hand Dominance   Dominant Hand: Right    Extremity/Trunk Assessment   Upper Extremity Assessment Upper Extremity Assessment: Overall WFL for tasks assessed(R hand with dressing  on)    Lower Extremity Assessment Lower Extremity Assessment: Overall WFL for tasks assessed    Cervical / Trunk Assessment Cervical / Trunk Assessment: Normal  Communication   Communication: No difficulties  Cognition Arousal/Alertness: Awake/alert Behavior During Therapy: WFL for tasks assessed/performed Overall Cognitive Status: Within Functional Limits for tasks assessed                 General Comments General comments (skin integrity, edema, etc.): pt sitting EOB HR 112-130, HR increased to 160 with standing and steps at bedside; denies dizziness, fatigue, lightheadedness, but does report "not myself"    Exercises     Assessment/Plan    PT Assessment Patient needs continued PT services  PT Problem List Decreased activity tolerance       PT Treatment Interventions DME instruction;Gait training;Stair training;Functional mobility training;Therapeutic activities;Therapeutic exercise;Balance training;Neuromuscular re-education;Patient/family education;Manual techniques    PT Goals (Current goals can be found in the Care Plan section)  Acute Rehab PT Goals Patient Stated Goal: return home PT Goal Formulation: With patient Time For Goal Achievement: 04/12/19 Potential to Achieve Goals: Good    Frequency Min 3X/week   Barriers to discharge        Co-evaluation               AM-PAC PT "6 Clicks" Mobility  Outcome Measure Help needed turning from your back to your side while in a flat bed without using bedrails?: None Help needed moving from lying on your back to sitting on the side of a flat bed without using bedrails?: None Help needed moving to and from a bed to a chair (including a wheelchair)?: None Help needed standing up from a chair using your arms (e.g., wheelchair or bedside chair)?: None Help needed to walk in hospital room?: None Help needed climbing 3-5 steps with a railing? : A Little 6 Click Score: 23    End of Session   Activity  Tolerance: Patient tolerated treatment well Patient left: in bed;with call bell/phone within reach Nurse Communication: Mobility status;Other (comment)(HR) PT Visit Diagnosis: Other abnormalities of gait and mobility (R26.89)    Time: 8882-8003 PT Time Calculation (min) (ACUTE ONLY): 18 min   Charges:   PT Evaluation $PT Eval Moderate Complexity: 1 Mod          Tori Darling Cieslewicz PT, DPT 04/05/19, 12:41 PM [phone]

## 2019-04-06 LAB — AEROBIC CULTURE W GRAM STAIN (SUPERFICIAL SPECIMEN)

## 2019-04-07 LAB — CULTURE, BLOOD (ROUTINE X 2)
Culture: NO GROWTH
Culture: NO GROWTH
Special Requests: ADEQUATE
Special Requests: ADEQUATE

## 2019-04-08 ENCOUNTER — Other Ambulatory Visit: Payer: Self-pay | Admitting: Pharmacist

## 2019-04-08 ENCOUNTER — Other Ambulatory Visit: Payer: Self-pay | Admitting: *Deleted

## 2019-04-08 ENCOUNTER — Inpatient Hospital Stay: Payer: Medicare Other | Admitting: Family

## 2019-04-08 ENCOUNTER — Other Ambulatory Visit: Payer: Self-pay

## 2019-04-08 DIAGNOSIS — L02519 Cutaneous abscess of unspecified hand: Secondary | ICD-10-CM | POA: Insufficient documentation

## 2019-04-08 NOTE — Patient Outreach (Signed)
[facility]) Care Management  04/08/2019  Nicole [name] [birth year] [id number]   Called patient regarding medication assistance. Once explaining who I was and the purpose of my call, Patient said she had her medications under control and that it was not a good time to talk.  Today's call was the fourth call to the patient about medication assistance.   Plan: Close patient's case. I will gladly reopen upon need/request/patient interest.  Elayne Guerin, PharmD, [facility] Clinical Pharmacist [phone]

## 2019-04-08 NOTE — Patient Outreach (Signed)
[name] Care Management  04/08/2019  Nicole [name] 1940-02-03 [id number]   RN Health Coach Discipline Closure  Referral Date:03/11/2019 Referral Source:Transfer from [place] Reason for Referral:Continued Disease Management Education Insurance:[employer] [facility] Medicare   Outreach Attempt:  Patient discharged from hospital on 04/05/19 after hand surgery.  [name] Liaison will place [name] referral for Transition of Care follow up post discharge.  Plan:  RN Health Coach will close Disease Management Case at this time.  RN Health Coach will send primary care provider Discipline Closure Letter.  [name] [phone] [name]

## 2019-04-09 ENCOUNTER — Other Ambulatory Visit: Payer: Self-pay | Admitting: *Deleted

## 2019-04-09 DIAGNOSIS — Z4789 Encounter for other orthopedic aftercare: Secondary | ICD-10-CM | POA: Insufficient documentation

## 2019-04-09 DIAGNOSIS — M79644 Pain in right finger(s): Secondary | ICD-10-CM | POA: Diagnosis not present

## 2019-04-09 NOTE — Patient Outreach (Signed)
[facility]) Care Management  04/09/2019  Nicole [name] 1940-04-02 [id number]   New referral received from hospital liaison as member was admitted to hospital 3/9-3/12 for cellulitis of the hand requiring I&D.  Call placed to member for assessment of needs, she report this is not a good time to talk.  Will send outreach letter and follow up within the next 3-4 business days.  [name], [place], MSN [place] [phone]

## 2019-04-10 LAB — ANAEROBIC CULTURE

## 2019-04-11 ENCOUNTER — Telehealth: Payer: Self-pay | Admitting: Interventional Cardiology

## 2019-04-11 ENCOUNTER — Ambulatory Visit: Payer: Self-pay | Admitting: *Deleted

## 2019-04-11 NOTE — Telephone Encounter (Signed)
Called and spoke to the patient. She states that she has been having swelling in her left ankle. She states that she has been taking her furosemide 20 mg everyday for the past few weeks. She states that she has not taken the PM prn dose. She states that she has to go to the bathroom every few seconds. She states that she has been elevating her ankle and it improves with elevation. Instructed the patient to avoid salt in her diet.   Patient also asking if she should continue taking the irbesartan 300 mg QD or if she should decrease it. She states that they mentioned when she was in the hospital about maybe decreasing the dose. BP has been 130/77, 133/84, and 119/81 HR 80s. Cr- 1.10 on 3/12. Made patient aware that  I will forward for review.

## 2019-04-11 NOTE — Telephone Encounter (Signed)
Patient c/o Palpitations:  High priority if patient c/o lightheadedness, shortness of breath, or chest pain  1) How long have you had palpitations/irregular HR/ Afib? Are you having the symptoms now? 3 years of afib on and off. No symptoms currently.  2) Are you currently experiencing lightheadedness, SOB or CP? SOB  3) Do you have a history of afib (atrial fibrillation) or irregular heart rhythm? Yes  4) Have you checked your BP or HR? (document readings if available):   BP: 130/77  BP: 133/84 HR: 84 BP: 119/81  HR: 83  5) Are you experiencing any other symptoms? Weakness, abdominal discomfort, lower back aches  Patient states she is requesting to be seen in the office by Dr. Irish Lack within the next 1-2 weeks for a hospital follow up. She states she would also like to discuss irbesartan (AVAPRO) 300 MG tablet medication dosage adjustments. Please return call to assist.

## 2019-04-11 NOTE — Telephone Encounter (Signed)
BP well controlled. COntinue current dose of irbesartan. Low salt diet.  Elevate leg.  Can use compression stocking. When severe, can use the extra ose of lasix.  BP and renal function stable so would not make a change in meds.

## 2019-04-12 ENCOUNTER — Other Ambulatory Visit: Payer: Self-pay | Admitting: *Deleted

## 2019-04-12 DIAGNOSIS — M79644 Pain in right finger(s): Secondary | ICD-10-CM | POA: Diagnosis not present

## 2019-04-12 NOTE — Patient Outreach (Signed)
[facility]) Care Management  04/12/2019  Nicole [name] November 26, 1940 [id number]   Outreach attempt #2, successful however member again state this is not a good time to talk.  Will follow up within the next 3-4 business days.    [name], [place], MSN [name] [phone]

## 2019-04-12 NOTE — Telephone Encounter (Signed)
I spoke with patient and gave her information from Dr Irish Lack

## 2019-04-16 DIAGNOSIS — M79644 Pain in right finger(s): Secondary | ICD-10-CM | POA: Diagnosis not present

## 2019-04-18 ENCOUNTER — Other Ambulatory Visit: Payer: Self-pay | Admitting: *Deleted

## 2019-04-18 DIAGNOSIS — M79644 Pain in right finger(s): Secondary | ICD-10-CM | POA: Diagnosis not present

## 2019-04-18 NOTE — Patient Outreach (Signed)
[facility]) Care Management  04/18/2019  Nicole [name] 1940/05/22 [id number]   Outreach attempt #3, successful but member state she is at therapy for hand and not able to talk at this time.  Advised that this care manager is trying to complete assessment of needs.  She state there may be something she need but she will have to think about it, does not have time today. Will follow up with 4th attempt within the next week.  [name], [place], MSN [place] [phone]

## 2019-04-23 ENCOUNTER — Other Ambulatory Visit: Payer: Self-pay | Admitting: Endocrinology

## 2019-04-24 DIAGNOSIS — M79644 Pain in right finger(s): Secondary | ICD-10-CM | POA: Diagnosis not present

## 2019-04-25 ENCOUNTER — Other Ambulatory Visit: Payer: Self-pay | Admitting: *Deleted

## 2019-04-25 NOTE — Patient Outreach (Signed)
[facility]) Care Management  04/25/2019  Nicole [name] [birth year] [id number]   Outreach attempt #4, unsuccessful.  Final attempt made to reach member for assessment of needs, no answer.  HIPAA compliant voice message left.  Will close case at this time due to inability to establish/maintain contact.  [name], [place], MSN [place] [phone]

## 2019-04-26 DIAGNOSIS — M79644 Pain in right finger(s): Secondary | ICD-10-CM | POA: Diagnosis not present

## 2019-04-30 ENCOUNTER — Encounter: Payer: Self-pay | Admitting: Internal Medicine

## 2019-04-30 ENCOUNTER — Ambulatory Visit: Payer: Medicare Other | Admitting: Internal Medicine

## 2019-04-30 ENCOUNTER — Other Ambulatory Visit: Payer: Self-pay

## 2019-04-30 VITALS — BP 140/84 | HR 91 | Temp 98.5°F | Resp 16 | Ht 68.0 in | Wt 170.2 lb

## 2019-04-30 DIAGNOSIS — N3000 Acute cystitis without hematuria: Secondary | ICD-10-CM | POA: Insufficient documentation

## 2019-04-30 DIAGNOSIS — I5032 Chronic diastolic (congestive) heart failure: Secondary | ICD-10-CM | POA: Diagnosis not present

## 2019-04-30 DIAGNOSIS — E118 Type 2 diabetes mellitus with unspecified complications: Secondary | ICD-10-CM

## 2019-04-30 DIAGNOSIS — M79644 Pain in right finger(s): Secondary | ICD-10-CM | POA: Diagnosis not present

## 2019-04-30 MED ORDER — TORSEMIDE 20 MG PO TABS: 20.0000 mg | ORAL_TABLET | Freq: Every day | ORAL | 1 refills | Status: DC

## 2019-04-30 MED ORDER — FARXIGA 10 MG PO TABS: 10.0000 mg | ORAL_TABLET | Freq: Every day | ORAL | 1 refills | Status: DC

## 2019-04-30 NOTE — Progress Notes (Signed)
Subjective:  Patient ID: Nicole [name], female    DOB: Jun 19, 1940  Age: 79 y.o. MRN: [id number]  CC: Congestive Heart Failure  This visit occurred during the SARS-CoV-2 public health emergency.  Safety protocols were in place, including screening questions prior to the visit, additional usage of staff PPE, and extensive cleaning of exam room while observing appropriate contact time as indicated for disinfecting solutions.    HPI Nicole [name] presents for f/up - She continues to c/o LE edema. 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 She has not gotten much symptom relief with lasix.  Outpatient Medications Prior to Visit  Medication Sig Dispense Refill  . b complex vitamins tablet Take 1 tablet by mouth daily.    . calcium-vitamin D (OSCAL WITH D) 500-200 MG-UNIT tablet Take 2 tablets by mouth daily with breakfast.    . Collagenase POWD by Does not apply route daily.     . CONTOUR NEXT TEST test strip USE 1 STRIP TO CHECK GLUCOSE TWICE DAILY 200 each 2  . diltiazem (CARDIZEM CD) 360 MG 24 hr capsule Take 1 capsule (360 mg total) by mouth daily. 90 capsule 3  . ELIQUIS 5 MG TABS tablet Take 5 mg by mouth 2 (two) times daily.    . hydrocortisone (ANUSOL-HC) 2.5 % rectal cream Place 1 application rectally 3 (three) times daily. Use for 10 days. 30 g 1  . irbesartan (AVAPRO) 300 MG tablet Take 1 tablet (300 mg total) by mouth daily. Please HOLD and discuss with your PCP/ cardiologist to decrease dose 90 tablet 1  . Lancets (ACCU-CHEK SOFT TOUCH) lancets Use to check blood sugars daily Dx E11.9 100 each 3  . metFORMIN (GLUCOPHAGE-XR) 500 MG 24 hr tablet TAKE 2 TABLETS(1000 MG) BY MOUTH DAILY WITH BREAKFAST (Patient taking differently: Take 1,000 mg by mouth daily with breakfast. ) 180 tablet 3  . metoprolol tartrate (LOPRESSOR) 25 MG tablet Take 1 tablet every 8 hours AS NEEDED for rapid heart rate (Patient taking differently: Take 25 mg by mouth See admin instructions. Take 25mg  every 8 hours AS  NEEDED for rapid heart rate) 30 tablet 1  . Multiple Vitamins-Minerals (MULTIPLE VITAMINS/WOMENS PO) Take by mouth daily.    [name] omega-3 acid ethyl esters (LOVAZA) 1 g capsule Take 2 capsules (2 g total) by mouth 2 (two) times daily. 360 capsule 1  . PREBIOTIC PRODUCT PO Take 1 capsule by mouth daily.    . rosuvastatin (CRESTOR) 5 MG tablet Take 1 tablet (5 mg total) by mouth daily. 90 tablet 1  . traMADol (ULTRAM) 50 MG tablet Take 1 tablet (50 mg total) by mouth every 8 (eight) hours as needed for moderate pain or severe pain. 20 tablet 0  . Turmeric 500 MG CAPS Take 1,000 mg by mouth daily.    . dapagliflozin propanediol (FARXIGA) 10 MG TABS tablet Take 10 mg by mouth daily. 90 tablet 1  . furosemide (LASIX) 20 MG tablet TAKE 1 TABLET DAILY AS NEEDED FOR FLUID. CAN TAKE 2ND TABLET IN AFTERNOON AS NEEDED (Patient taking differently: Take 20-40 mg by mouth See admin instructions. TAKE 20mg   DAILY AS NEEDED FOR FLUID. CAN TAKE 2ND TABLET IN AFTERNOON AS NEEDED) 180 tablet 3   No facility-administered medications prior to visit.    ROS Review of Systems  Constitutional: Negative for appetite change, diaphoresis, fatigue and unexpected weight change.  HENT: Negative.   Eyes: Negative for visual disturbance.  Respiratory: Negative for cough, chest tightness, shortness of  breath and wheezing.   Cardiovascular: Positive for leg swelling. Negative for chest pain and palpitations.  Gastrointestinal: Negative for abdominal pain, constipation, diarrhea, nausea and vomiting.  Endocrine: Positive for polyuria. Negative for polydipsia and polyphagia.  Genitourinary: Positive for frequency. Negative for difficulty urinating and urgency.  Musculoskeletal: Negative.   Skin: Negative.   Neurological: Negative.  Negative for dizziness, weakness, light-headedness and numbness.  Hematological: Negative for adenopathy. Does not bruise/bleed easily.    Objective:  BP 140/84 (BP Location: Left Arm, Patient  Position: Sitting, Cuff Size: Normal)   Pulse 91   Temp 98.5 F (36.9 C) (Oral)   Resp 16   Ht 5\' 8"  (1.727 m)   Wt 170 lb 4 oz (77.2 kg)   SpO2 97%   BMI 25.89 kg/m   BP Readings from Last 3 Encounters:  04/30/19 140/84  04/05/19 107/64  04/02/19 100/70    Wt Readings from Last 3 Encounters:  04/30/19 170 lb 4 oz (77.2 kg)  04/04/19 162 lb 0.6 oz (73.5 kg)  04/02/19 162 lb (73.5 kg)    Physical Exam Vitals reviewed.  HENT:     Nose: Nose normal.  Eyes:     Conjunctiva/sclera: Conjunctivae normal.  Cardiovascular:     Rate and Rhythm: Normal rate. Rhythm irregularly irregular.     Heart sounds: No murmur. No gallop.   Pulmonary:     Breath sounds: No stridor. No wheezing, rhonchi or rales.  Abdominal:     General: Abdomen is flat.     Palpations: There is no mass.     Tenderness: There is no abdominal tenderness. There is no guarding.  Musculoskeletal:        General: Normal range of motion.     Cervical back: Neck supple.     Right lower leg: 2+ Pitting Edema present.     Left lower leg: 2+ Pitting Edema present.  Lymphadenopathy:     Cervical: No cervical adenopathy.  Skin:    Coloration: Skin is not pale.  Neurological:     General: No focal deficit present.     Mental Status: She is alert.  Psychiatric:        Mood and Affect: Mood normal.        Behavior: Behavior normal.     Lab Results  Component Value Date   WBC 14.2 (H) 04/05/2019   HGB 14.7 04/05/2019   HCT 45.7 04/05/2019   PLT 224 04/05/2019   GLUCOSE 326 (H) 04/05/2019   CHOL 185 02/05/2019   TRIG 162.0 (H) 02/05/2019   HDL 48.70 02/05/2019   LDLDIRECT 108.0 07/18/2018   LDLCALC 104 (H) 02/05/2019   ALT 24 04/02/2019   AST 15 04/02/2019   NA 135 04/05/2019   K 4.3 04/05/2019   CL 101 04/05/2019   CREATININE 1.10 (H) 04/05/2019   BUN 30 (H) 04/05/2019   CO2 22 04/05/2019   TSH 0.427 08/01/2018   INR 1.1 04/02/2019   HGBA1C 8.6 (H) 04/04/2019   MICROALBUR 1.6 07/18/2018     DG Hand Complete Right  Result Date: 04/02/2019 CLINICAL DATA:  Cellulitis for 5 days. EXAM: RIGHT HAND - COMPLETE 3+ VIEW COMPARISON:  Contralateral wrist from 2015 FINDINGS: Soft tissue swelling about the hand and wrist. Signs of prior trauma to the ulna. Degenerative changes particularly at first [facility] and about the interphalangeal joints. No signs of destructive bone process. Well-circumscribed lucency in the scaphoid may represent a carpal cyst or intraosseous lipoma. IMPRESSION: Soft tissue swelling about the  hand and wrist. No signs of destructive bone process. If there is concern for osteomyelitis MRI may be helpful. Electronically Signed   By: Zetta Bills M.D.   On: 04/02/2019 10:23   [place] RT UPPER EXTREM LTD SOFT TISSUE NON VASCULAR  Result Date: 04/03/2019 CLINICAL DATA:  Right hand cellulitis with swelling for 6 days. EXAM: ULTRASOUND RIGHT UPPER EXTREMITY LIMITED TECHNIQUE: Ultrasound examination of the upper extremity soft tissues was performed in the area of clinical concern. COMPARISON:  Right hand radiographs 04/02/2019 FINDINGS: Ultrasound targeted to the dorsum of the right hand was performed. The soft tissues are swollen and edematous. No focal fluid collection or mass lesion identified. No evidence of foreign body or significant tenosynovitis. IMPRESSION: 1. No focal fluid collection is seen in the area of concern to suggest an abscess. The soft tissues are diffusely swollen and edematous. 2. Consider further evaluation with CT or MRI as clinically warranted. Electronically Signed   By: Richardean Sale M.D.   On: 04/03/2019 13:41    Assessment & Plan:   Hatice was seen today for congestive heart failure.  Diagnoses and all orders for this visit:  Acute cystitis without hematuria -     Cancel: CULTURE, URINE COMPREHENSIVE; Future  Type II diabetes mellitus with manifestations (HCC) -     dapagliflozin propanediol (FARXIGA) 10 MG TABS tablet; Take 10 mg by mouth  daily.  Diastolic dysfunction with chronic heart failure ([place])- Will change to demadex as it has better bioavailability and efficacy than Lasix. She will elevated her LE's. -     torsemide (DEMADEX) 20 MG tablet; Take 1 tablet (20 mg total) by mouth daily.   I have discontinued Kilie K. Ryden's furosemide. I am also having her start on torsemide. Additionally, I am having her maintain her accu-chek soft touch, metFORMIN, diltiazem, metoprolol tartrate, Multiple Vitamins-Minerals (MULTIPLE VITAMINS/WOMENS PO), Turmeric, b complex vitamins, calcium-vitamin D, PREBIOTIC PRODUCT PO, Collagenase, Contour Next Test, rosuvastatin, omega-3 acid ethyl esters, Eliquis, hydrocortisone, irbesartan, traMADol, and [place].  Meds ordered this encounter  Medications  . dapagliflozin propanediol (FARXIGA) 10 MG TABS tablet    Sig: Take 10 mg by mouth daily.    Dispense:  90 tablet    Refill:  1  . torsemide (DEMADEX) 20 MG tablet    Sig: Take 1 tablet (20 mg total) by mouth daily.    Dispense:  90 tablet    Refill:  1     Follow-up: Return in about 3 months (around 07/30/2019).  Scarlette Calico, MD

## 2019-04-30 NOTE — Patient Instructions (Signed)

## 2019-05-02 DIAGNOSIS — M79644 Pain in right finger(s): Secondary | ICD-10-CM | POA: Diagnosis not present

## 2019-05-08 ENCOUNTER — Ambulatory Visit: Payer: Medicare Other | Admitting: Internal Medicine

## 2019-05-08 ENCOUNTER — Other Ambulatory Visit: Payer: Self-pay

## 2019-05-08 ENCOUNTER — Encounter: Payer: Self-pay | Admitting: Internal Medicine

## 2019-05-08 ENCOUNTER — Ambulatory Visit (INDEPENDENT_AMBULATORY_CARE_PROVIDER_SITE_OTHER): Payer: Medicare Other

## 2019-05-08 ENCOUNTER — Other Ambulatory Visit: Payer: Self-pay | Admitting: Internal Medicine

## 2019-05-08 VITALS — BP 118/68 | HR 93 | Temp 98.3°F | Resp 16 | Ht 68.0 in

## 2019-05-08 DIAGNOSIS — W19XXXA Unspecified fall, initial encounter: Secondary | ICD-10-CM

## 2019-05-08 DIAGNOSIS — S79912A Unspecified injury of left hip, initial encounter: Secondary | ICD-10-CM | POA: Diagnosis not present

## 2019-05-08 DIAGNOSIS — L89151 Pressure ulcer of sacral region, stage 1: Secondary | ICD-10-CM

## 2019-05-08 DIAGNOSIS — R102 Pelvic and perineal pain: Secondary | ICD-10-CM | POA: Insufficient documentation

## 2019-05-08 DIAGNOSIS — M25552 Pain in left hip: Secondary | ICD-10-CM | POA: Diagnosis not present

## 2019-05-08 NOTE — Progress Notes (Signed)
Subjective:  Patient ID: Nicole [name], female    DOB: March 01, 1940  Age: 79 y.o. MRN: [id number]  CC: Back Pain  This visit occurred during the SARS-CoV-2 public health emergency.  Safety protocols were in place, including screening questions prior to the visit, additional usage of staff PPE, and extensive cleaning of exam room while observing appropriate contact time as indicated for disinfecting solutions.    HPI Nicole [name] presents for concerns about pelvic pain after a fall.  She fell in her bathtub 2 days ago and now has diffuse discomfort in her pelvis.  She can bear weight on the lower extremities but it causes discomfort.  Outpatient Medications Prior to Visit  Medication Sig Dispense Refill  . albuterol (VENTOLIN HFA) 108 (90 Base) MCG/ACT inhaler albuterol sulfate HFA 90 mcg/actuation aerosol inhaler  INL 2 PFS PO Q 4 H PRN    . amoxicillin-clavulanate (AUGMENTIN) 875-125 MG tablet amoxicillin 875 mg-potassium clavulanate 125 mg tablet  TAKE 1 TABLET BY MOUTH EVERY 12 HOURS FOR 10 DAYS    . b complex vitamins tablet Take 1 tablet by mouth daily.    . calcium-vitamin D (OSCAL WITH D) 500-200 MG-UNIT tablet Take 2 tablets by mouth daily with breakfast.    . Collagenase POWD by Does not apply route daily.     . CONTOUR NEXT TEST test strip USE 1 STRIP TO CHECK GLUCOSE TWICE DAILY 200 each 2  . dapagliflozin propanediol (FARXIGA) 10 MG TABS tablet Take 10 mg by mouth daily. 90 tablet 1  . diltiazem (CARDIZEM CD) 360 MG 24 hr capsule Take 1 capsule (360 mg total) by mouth daily. 90 capsule 3  . ELIQUIS 5 MG TABS tablet Take 5 mg by mouth 2 (two) times daily.    [name] glucose blood test strip Contour Next Test Strips  USE 1 STRIP TO CHECK GLUCOSE TWICE DAILY    . hydrocortisone (ANUSOL-HC) 2.5 % rectal cream Place 1 application rectally 3 (three) times daily. Use for 10 days. 30 g 1  . irbesartan (AVAPRO) 300 MG tablet Take 1 tablet (300 mg total) by mouth daily. Please HOLD and  discuss with your PCP/ cardiologist to decrease dose 90 tablet 1  . Lancets (ACCU-CHEK SOFT TOUCH) lancets Use to check blood sugars daily Dx E11.9 100 each 3  . metFORMIN (GLUCOPHAGE-XR) 500 MG 24 hr tablet TAKE 2 TABLETS(1000 MG) BY MOUTH DAILY WITH BREAKFAST (Patient taking differently: Take 1,000 mg by mouth daily with breakfast. ) 180 tablet 3  . metoprolol tartrate (LOPRESSOR) 25 MG tablet Take 1 tablet every 8 hours AS NEEDED for rapid heart rate (Patient taking differently: Take 25 mg by mouth See admin instructions. Take 25mg  every 8 hours AS NEEDED for rapid heart rate) 30 tablet 1  . Multiple Vitamins-Minerals (MULTIPLE VITAMINS/WOMENS PO) Take by mouth daily.    [name] omega-3 acid ethyl esters (LOVAZA) 1 g capsule Take 2 capsules (2 g total) by mouth 2 (two) times daily. 360 capsule 1  . PREBIOTIC PRODUCT PO Take 1 capsule by mouth daily.    . rosuvastatin (CRESTOR) 5 MG tablet Take 1 tablet (5 mg total) by mouth daily. 90 tablet 1  . torsemide (DEMADEX) 20 MG tablet Take 1 tablet (20 mg total) by mouth daily. 90 tablet 1  . traMADol (ULTRAM) 50 MG tablet Take 1 tablet (50 mg total) by mouth every 8 (eight) hours as needed for moderate pain or severe pain. 20 tablet 0  . Turmeric 500 MG CAPS  Take 1,000 mg by mouth daily.     No facility-administered medications prior to visit.    ROS Review of Systems  Constitutional: Negative.  Negative for chills and fever.  HENT: Negative.   Eyes: Negative.   Respiratory: Negative for cough, chest tightness and wheezing.   Cardiovascular: Negative for chest pain, palpitations and leg swelling.  Gastrointestinal: Negative for abdominal pain, constipation, nausea and vomiting.  Endocrine: Negative.   Genitourinary: Positive for pelvic pain.  Musculoskeletal: Negative for back pain and myalgias.  Skin: Negative.   Neurological: Negative.  Negative for dizziness, weakness and light-headedness.  Hematological: Negative for adenopathy. Does not  bruise/bleed easily.  Psychiatric/Behavioral: Negative.     Objective:  BP 118/68 (BP Location: Left Arm, Patient Position: Sitting, Cuff Size: Large)   Pulse 93   Temp 98.3 F (36.8 C) (Oral)   Resp 16   Ht 5\' 8"  (1.727 m)   SpO2 97%   BMI 25.89 kg/m   BP Readings from Last 3 Encounters:  05/08/19 118/68  04/30/19 140/84  04/05/19 107/64    Wt Readings from Last 3 Encounters:  04/30/19 170 lb 4 oz (77.2 kg)  04/04/19 162 lb 0.6 oz (73.5 kg)  04/02/19 162 lb (73.5 kg)    Physical Exam Vitals reviewed.  Constitutional:      Appearance: She is ill-appearing (ina wheelcair but she can bear weight on BLE).  HENT:     Head: Normocephalic and atraumatic.     Mouth/Throat:     Mouth: Mucous membranes are moist.  Eyes:     General: No scleral icterus.    Conjunctiva/sclera: Conjunctivae normal.  Cardiovascular:     Rate and Rhythm: Normal rate. Rhythm regularly irregular.     Heart sounds: No murmur.  Pulmonary:     Effort: Pulmonary effort is normal.     Breath sounds: No stridor. No wheezing, rhonchi or rales.  Abdominal:     General: Abdomen is flat.     Palpations: There is no mass.     Tenderness: There is no abdominal tenderness. There is no guarding.  Musculoskeletal:        General: Normal range of motion.     Cervical back: Neck supple. No tenderness.  Skin:    General: Skin is warm and dry.     Comments: Grade 1 decubitus ulcers are noted across the sacrum and posterior pelvis.  Neurological:     General: No focal deficit present.     Mental Status: She is alert.  Psychiatric:        Mood and Affect: Mood normal.        Behavior: Behavior normal.     Lab Results  Component Value Date   WBC 14.2 (H) 04/05/2019   HGB 14.7 04/05/2019   HCT 45.7 04/05/2019   PLT 224 04/05/2019   GLUCOSE 326 (H) 04/05/2019   CHOL 185 02/05/2019   TRIG 162.0 (H) 02/05/2019   HDL 48.70 02/05/2019   LDLDIRECT 108.0 07/18/2018   LDLCALC 104 (H) 02/05/2019   ALT 24  04/02/2019   AST 15 04/02/2019   NA 135 04/05/2019   K 4.3 04/05/2019   CL 101 04/05/2019   CREATININE 1.10 (H) 04/05/2019   BUN 30 (H) 04/05/2019   CO2 22 04/05/2019   TSH 0.427 08/01/2018   INR 1.1 04/02/2019   HGBA1C 8.6 (H) 04/04/2019   MICROALBUR 1.6 07/18/2018    DG Hand Complete Right  Result Date: 04/02/2019 CLINICAL DATA:  Cellulitis for 5  days. EXAM: RIGHT HAND - COMPLETE 3+ VIEW COMPARISON:  Contralateral wrist from 2015 FINDINGS: Soft tissue swelling about the hand and wrist. Signs of prior trauma to the ulna. Degenerative changes particularly at first [facility] and about the interphalangeal joints. No signs of destructive bone process. Well-circumscribed lucency in the scaphoid may represent a carpal cyst or intraosseous lipoma. IMPRESSION: Soft tissue swelling about the hand and wrist. No signs of destructive bone process. If there is concern for osteomyelitis MRI may be helpful. Electronically Signed   By: Zetta Bills M.D.   On: 04/02/2019 10:23   [place] RT UPPER EXTREM LTD SOFT TISSUE NON VASCULAR  Result Date: 04/03/2019 CLINICAL DATA:  Right hand cellulitis with swelling for 6 days. EXAM: ULTRASOUND RIGHT UPPER EXTREMITY LIMITED TECHNIQUE: Ultrasound examination of the upper extremity soft tissues was performed in the area of clinical concern. COMPARISON:  Right hand radiographs 04/02/2019 FINDINGS: Ultrasound targeted to the dorsum of the right hand was performed. The soft tissues are swollen and edematous. No focal fluid collection or mass lesion identified. No evidence of foreign body or significant tenosynovitis. IMPRESSION: 1. No focal fluid collection is seen in the area of concern to suggest an abscess. The soft tissues are diffusely swollen and edematous. 2. Consider further evaluation with CT or MRI as clinically warranted. Electronically Signed   By: Richardean Sale M.D.   On: 04/03/2019 13:41   No results found.  Assessment & Plan:   Ren was seen today for back  pain.  Diagnoses and all orders for this visit:  Pain pelvic- Plain films are negative for fracture.  Her symptoms are consistent with contusion/strain.  She does not want a prescription for pain relief. -     Cancel: DG HIPS BILAT WITH PELVIS 2V; Future  Fall, initial encounter -     Cancel: DG HIPS BILAT WITH PELVIS 2V; Future  Decubitus ulcer of sacral region, stage 1 -     Ambulatory referral to [place]   I am having Nicole [name] maintain her accu-chek soft touch, metFORMIN, diltiazem, metoprolol tartrate, Multiple Vitamins-Minerals (MULTIPLE VITAMINS/WOMENS PO), Turmeric, b complex vitamins, calcium-vitamin D, PREBIOTIC PRODUCT PO, Collagenase, Contour Next Test, rosuvastatin, omega-3 acid ethyl esters, Eliquis, hydrocortisone, irbesartan, traMADol, Farxiga, torsemide, albuterol, glucose blood, and amoxicillin-clavulanate.  No orders of the defined types were placed in this encounter.    Follow-up: Return if symptoms worsen or fail to improve.  Scarlette Calico, MD

## 2019-05-08 NOTE — Patient Instructions (Signed)
Acute Back Pain, Adult Acute back pain is sudden and usually short-lived. It is often caused by an injury to the muscles and tissues in the back. The injury may result from:  A muscle or ligament getting overstretched or torn (strained). Ligaments are tissues that connect bones to each other. Lifting something improperly can cause a back strain.  Wear and tear (degeneration) of the spinal disks. Spinal disks are circular tissue that provides cushioning between the bones of the spine (vertebrae).  Twisting motions, such as while playing sports or doing yard work.  A hit to the back.  Arthritis. You may have a physical exam, lab tests, and imaging tests to find the cause of your pain. Acute back pain usually goes away with rest and home care. Follow these instructions at home: Managing pain, stiffness, and swelling  Take over-the-counter and prescription medicines only as told by your health care provider.  Your health care provider may recommend applying ice during the first 24-48 hours after your pain starts. To do this: ? Put ice in a plastic bag. ? Place a towel between your skin and the bag. ? Leave the ice on for 20 minutes, 2-3 times a day.  If directed, apply heat to the affected area as often as told by your health care provider. Use the heat source that your health care provider recommends, such as a moist heat pack or a heating pad. ? Place a towel between your skin and the heat source. ? Leave the heat on for 20-30 minutes. ? Remove the heat if your skin turns bright red. This is especially important if you are unable to feel pain, heat, or cold. You have a greater risk of getting burned. Activity   Do not stay in bed. Staying in bed for more than 1-2 days can delay your recovery.  Sit up and stand up straight. Avoid leaning forward when you sit, or hunching over when you stand. ? If you work at a desk, sit close to it so you do not need to lean over. Keep your chin tucked  in. Keep your neck drawn back, and keep your elbows bent at a right angle. Your arms should look like the letter "L." ? Sit high and close to the steering wheel when you drive. Add lower back (lumbar) support to your car seat, if needed.  Take short walks on even surfaces as soon as you are able. Try to increase the length of time you walk each day.  Do not sit, drive, or stand in one place for more than 30 minutes at a time. Sitting or standing for long periods of time can put stress on your back.  Do not drive or use heavy machinery while taking prescription pain medicine.  Use proper lifting techniques. When you bend and lift, use positions that put less stress on your back: ? Bend your knees. ? Keep the load close to your body. ? Avoid twisting.  Exercise regularly as told by your health care provider. Exercising helps your back heal faster and helps prevent back injuries by keeping muscles strong and flexible.  Work with a physical therapist to make a safe exercise program, as recommended by your health care provider. Do any exercises as told by your physical therapist. Lifestyle  Maintain a healthy weight. Extra weight puts stress on your back and makes it difficult to have good posture.  Avoid activities or situations that make you feel anxious or stressed. Stress and anxiety increase muscle   tension and can make back pain worse. Learn ways to manage anxiety and stress, such as through exercise. General instructions  Sleep on a firm mattress in a comfortable position. Try lying on your side with your knees slightly bent. If you lie on your back, put a pillow under your knees.  Follow your treatment plan as told by your health care provider. This may include: ? Cognitive or behavioral therapy. ? Acupuncture or massage therapy. ? Meditation or yoga. Contact a health care provider if:  You have pain that is not relieved with rest or medicine.  You have increasing pain going down  into your legs or buttocks.  Your pain does not improve after 2 weeks.  You have pain at night.  You lose weight without trying.  You have a fever or chills. Get help right away if:  You develop new bowel or bladder control problems.  You have unusual weakness or numbness in your arms or legs.  You develop nausea or vomiting.  You develop abdominal pain.  You feel faint. Summary  Acute back pain is sudden and usually short-lived.  Use proper lifting techniques. When you bend and lift, use positions that put less stress on your back.  Take over-the-counter and prescription medicines and apply heat or ice as directed by your health care provider. This information is not intended to replace advice given to you by your health care provider. Make sure you discuss any questions you have with your health care provider. Document Revised: 05/01/2018 Document Reviewed: 08/24/2016 Elsevier Patient Education  2020 Elsevier Inc.  

## 2019-05-17 ENCOUNTER — Telehealth: Payer: Self-pay | Admitting: Internal Medicine

## 2019-05-17 NOTE — Telephone Encounter (Signed)
New message:  Nicole [name] is calling from [facility] and states they have received the referral and after evaluation we are unable to take pt on. Nicole [name] states the pt is not home bound and leaves the house everyday. Nicole [name] does recommend the pt have outpt PT for fall prevention. He also states the pt is requesting a antibiotic cream for the sore on her bottom. He said he would grade it between a 1 and 2. Please advise.

## 2019-05-17 NOTE — Telephone Encounter (Signed)
Okay to order out patient PT?   Can you send in a cream for sore on her bottom is graded between a 1 and 2

## 2019-06-10 DIAGNOSIS — K644 Residual hemorrhoidal skin tags: Secondary | ICD-10-CM | POA: Diagnosis not present

## 2019-06-10 DIAGNOSIS — R350 Frequency of micturition: Secondary | ICD-10-CM | POA: Diagnosis not present

## 2019-06-17 ENCOUNTER — Encounter: Payer: Self-pay | Admitting: Internal Medicine

## 2019-06-17 ENCOUNTER — Ambulatory Visit: Payer: Medicare Other | Admitting: Internal Medicine

## 2019-06-17 ENCOUNTER — Other Ambulatory Visit: Payer: Self-pay

## 2019-06-17 VITALS — BP 130/70 | HR 96 | Temp 98.4°F | Resp 16 | Ht 68.0 in | Wt 164.0 lb

## 2019-06-17 DIAGNOSIS — E118 Type 2 diabetes mellitus with unspecified complications: Secondary | ICD-10-CM | POA: Diagnosis not present

## 2019-06-17 DIAGNOSIS — I10 Essential (primary) hypertension: Secondary | ICD-10-CM | POA: Diagnosis not present

## 2019-06-17 LAB — POCT GLYCOSYLATED HEMOGLOBIN (HGB A1C): Hemoglobin A1C: 8.8 % — AB (ref 4.0–5.6)

## 2019-06-17 LAB — POCT GLUCOSE (DEVICE FOR HOME USE): Glucose Fasting, POC: 213 mg/dL — AB (ref 70–99)

## 2019-06-17 MED ORDER — TOUJEO MAX SOLOSTAR 300 UNIT/ML ~~LOC~~ SOPN: 30.0000 [IU] | PEN_INJECTOR | Freq: Every day | SUBCUTANEOUS | 0 refills | Status: DC

## 2019-06-17 MED ORDER — NOVOFINE 32G X 6 MM MISC: 1.0000 | Freq: Every day | 1 refills | Status: AC

## 2019-06-17 NOTE — Progress Notes (Signed)
Subjective:  Patient ID: Nicole [name], female    DOB: 1940-09-22  Age: 79 y.o. MRN: [id number]  CC: Hypertension and Diabetes  This visit occurred during the SARS-CoV-2 public health emergency.  Safety protocols were in place, including screening questions prior to the visit, additional usage of staff PPE, and extensive cleaning of exam room while observing appropriate contact time as indicated for disinfecting solutions.    HPI Nicole [name] presents for f/up - She is concerned that her blood sugar is not adequately well controlled.  Her fasting blood sugar is around 160 and throughout the day it is 200 or greater.  She also complains of polyaphagia and polyuria.  She tells me she is compliant with Metformin and the SGLT2 inhibitor.  Outpatient Medications Prior to Visit  Medication Sig Dispense Refill  . albuterol (VENTOLIN HFA) 108 (90 Base) MCG/ACT inhaler albuterol sulfate HFA 90 mcg/actuation aerosol inhaler  INL 2 PFS PO Q 4 H PRN    . b complex vitamins tablet Take 1 tablet by mouth daily.    . calcium-vitamin D (OSCAL WITH D) 500-200 MG-UNIT tablet Take 2 tablets by mouth daily with breakfast.    . Collagenase POWD by Does not apply route daily.     . CONTOUR NEXT TEST test strip USE 1 STRIP TO CHECK GLUCOSE TWICE DAILY 200 each 2  . dapagliflozin propanediol (FARXIGA) 10 MG TABS tablet Take 10 mg by mouth daily. 90 tablet 1  . diltiazem (CARDIZEM CD) 360 MG 24 hr capsule Take 1 capsule (360 mg total) by mouth daily. 90 capsule 3  . glucose blood test strip Contour Next Test Strips  USE 1 STRIP TO CHECK GLUCOSE TWICE DAILY    . hydrocortisone (ANUSOL-HC) 2.5 % rectal cream Place 1 application rectally 3 (three) times daily. Use for 10 days. 30 g 1  . Lancets (ACCU-CHEK SOFT TOUCH) lancets Use to check blood sugars daily Dx E11.9 100 each 3  . metFORMIN (GLUCOPHAGE-XR) 500 MG 24 hr tablet TAKE 2 TABLETS(1000 MG) BY MOUTH DAILY WITH BREAKFAST (Patient taking differently: Take  1,000 mg by mouth daily with breakfast. ) 180 tablet 3  . metoprolol tartrate (LOPRESSOR) 25 MG tablet Take 1 tablet every 8 hours AS NEEDED for rapid heart rate (Patient taking differently: Take 25 mg by mouth See admin instructions. Take 25mg  every 8 hours AS NEEDED for rapid heart rate) 30 tablet 1  . Multiple Vitamins-Minerals (MULTIPLE VITAMINS/WOMENS PO) Take by mouth daily.    [name] omega-3 acid ethyl esters (LOVAZA) 1 g capsule Take 2 capsules (2 g total) by mouth 2 (two) times daily. 360 capsule 1  . PREBIOTIC PRODUCT PO Take 1 capsule by mouth daily.    . rosuvastatin (CRESTOR) 5 MG tablet Take 1 tablet (5 mg total) by mouth daily. 90 tablet 1  . torsemide (DEMADEX) 20 MG tablet Take 1 tablet (20 mg total) by mouth daily. 90 tablet 1  . traMADol (ULTRAM) 50 MG tablet Take 1 tablet (50 mg total) by mouth every 8 (eight) hours as needed for moderate pain or severe pain. 20 tablet 0  . Turmeric 500 MG CAPS Take 1,000 mg by mouth daily.    [name] ELIQUIS 5 MG TABS tablet Take 5 mg by mouth 2 (two) times daily.    [name] amoxicillin-clavulanate (AUGMENTIN) 875-125 MG tablet amoxicillin 875 mg-potassium clavulanate 125 mg tablet  TAKE 1 TABLET BY MOUTH EVERY 12 HOURS FOR 10 DAYS    . irbesartan (AVAPRO) 300 MG  tablet Take 1 tablet (300 mg total) by mouth daily. Please HOLD and discuss with your PCP/ cardiologist to decrease dose 90 tablet 1   No facility-administered medications prior to visit.    ROS Review of Systems  Constitutional: Negative for appetite change, diaphoresis, fatigue and unexpected weight change.  HENT: Negative.   Eyes: Negative for visual disturbance.  Respiratory: Negative for cough, chest tightness, shortness of breath and wheezing.   Cardiovascular: Negative for chest pain, palpitations and leg swelling.  Gastrointestinal: Negative for abdominal pain, constipation, diarrhea, nausea and vomiting.  Endocrine: Positive for polyphagia and polyuria. Negative for polydipsia.    Genitourinary: Negative.  Negative for difficulty urinating, dysuria, flank pain, frequency, hematuria and urgency.  Musculoskeletal: Negative.  Negative for arthralgias and myalgias.  Skin: Negative.  Negative for color change.  Neurological: Negative.  Negative for dizziness, weakness and light-headedness.  Hematological: Negative for adenopathy. Does not bruise/bleed easily.  Psychiatric/Behavioral: Negative.     Objective:  BP 130/70 (BP Location: Left Arm, Patient Position: Sitting, Cuff Size: Normal)   Pulse 96   Temp 98.4 F (36.9 C) (Oral)   Resp 16   Ht 5\' 8"  (1.727 m)   Wt 164 lb (74.4 kg)   SpO2 98%   BMI 24.94 kg/m   BP Readings from Last 3 Encounters:  06/17/19 130/70  05/08/19 118/68  04/30/19 140/84    Wt Readings from Last 3 Encounters:  06/17/19 164 lb (74.4 kg)  04/30/19 170 lb 4 oz (77.2 kg)  04/04/19 162 lb 0.6 oz (73.5 kg)    Physical Exam HENT:     Nose: Nose normal.     Mouth/Throat:     Mouth: Mucous membranes are moist.  Eyes:     General: No scleral icterus.    Conjunctiva/sclera: Conjunctivae normal.  Cardiovascular:     Rate and Rhythm: Normal rate. Rhythm irregularly irregular.     Pulses: Normal pulses.     Heart sounds: Normal heart sounds, S1 normal and S2 normal. No murmur.  Pulmonary:     Effort: Pulmonary effort is normal.     Breath sounds: No stridor. No wheezing, rhonchi or rales.  Abdominal:     General: Abdomen is flat.     Palpations: There is no mass.     Tenderness: There is no abdominal tenderness. There is no guarding.  Musculoskeletal:     Cervical back: Neck supple.     Right lower leg: 1+ Pitting Edema present.     Left lower leg: 1+ Pitting Edema present.  Skin:    General: Skin is warm and dry.     Coloration: Skin is not pale.  Neurological:     General: No focal deficit present.     Mental Status: She is alert.  Psychiatric:        Mood and Affect: Mood normal.        Behavior: Behavior normal.      Lab Results  Component Value Date   WBC 14.2 (H) 04/05/2019   HGB 14.7 04/05/2019   HCT 45.7 04/05/2019   PLT 224 04/05/2019   GLUCOSE 326 (H) 04/05/2019   CHOL 185 02/05/2019   TRIG 162.0 (H) 02/05/2019   HDL 48.70 02/05/2019   LDLDIRECT 108.0 07/18/2018   LDLCALC 104 (H) 02/05/2019   ALT 24 04/02/2019   AST 15 04/02/2019   NA 135 04/05/2019   K 4.3 04/05/2019   CL 101 04/05/2019   CREATININE 1.10 (H) 04/05/2019   BUN 30 (H)  04/05/2019   CO2 22 04/05/2019   TSH 0.427 08/01/2018   INR 1.1 04/02/2019   HGBA1C 8.8 (A) 06/17/2019   MICROALBUR 1.6 07/18/2018    DG Hand Complete Right  Result Date: 04/02/2019 CLINICAL DATA:  Cellulitis for 5 days. EXAM: RIGHT HAND - COMPLETE 3+ VIEW COMPARISON:  Contralateral wrist from 2015 FINDINGS: Soft tissue swelling about the hand and wrist. Signs of prior trauma to the ulna. Degenerative changes particularly at first [facility] and about the interphalangeal joints. No signs of destructive bone process. Well-circumscribed lucency in the scaphoid may represent a carpal cyst or intraosseous lipoma. IMPRESSION: Soft tissue swelling about the hand and wrist. No signs of destructive bone process. If there is concern for osteomyelitis MRI may be helpful. Electronically Signed   By: Zetta Bills M.D.   On: 04/02/2019 10:23   [place] RT UPPER EXTREM LTD SOFT TISSUE NON VASCULAR  Result Date: 04/03/2019 CLINICAL DATA:  Right hand cellulitis with swelling for 6 days. EXAM: ULTRASOUND RIGHT UPPER EXTREMITY LIMITED TECHNIQUE: Ultrasound examination of the upper extremity soft tissues was performed in the area of clinical concern. COMPARISON:  Right hand radiographs 04/02/2019 FINDINGS: Ultrasound targeted to the dorsum of the right hand was performed. The soft tissues are swollen and edematous. No focal fluid collection or mass lesion identified. No evidence of foreign body or significant tenosynovitis. IMPRESSION: 1. No focal fluid collection is seen in the area  of concern to suggest an abscess. The soft tissues are diffusely swollen and edematous. 2. Consider further evaluation with CT or MRI as clinically warranted. Electronically Signed   By: Richardean Sale M.D.   On: 04/03/2019 13:41    Assessment & Plan:   Elaura was seen today for hypertension and diabetes.  Diagnoses and all orders for this visit:  Essential hypertension- Her BP is adequately well controlled.  Type II diabetes mellitus with manifestations ([place])- Her A1c is up to 8.8% and she has symptomatic hyperglycemia.  I recommended that she add a basal insulin to her current regimen. -     insulin glargine, 2 Unit Dial, (TOUJEO MAX SOLOSTAR) 300 UNIT/ML Solostar Pen; Inject 30 Units into the skin daily. -     Insulin Pen Needle (NOVOFINE) 32G X 6 MM MISC; 1 Act by Does not apply route daily. -     POCT glycosylated hemoglobin (Hb A1C) -     POCT Glucose (Device for Home Use)   I have discontinued Samanthia K. Carlberg's irbesartan and amoxicillin-clavulanate. I am also having her start on Toujeo Max SoloStar and NovoFine. Additionally, I am having her maintain her accu-chek soft touch, metFORMIN, diltiazem, metoprolol tartrate, Multiple Vitamins-Minerals (MULTIPLE VITAMINS/WOMENS PO), Turmeric, b complex vitamins, calcium-vitamin D, PREBIOTIC PRODUCT PO, Collagenase, Contour Next Test, rosuvastatin, omega-3 acid ethyl esters, Eliquis, hydrocortisone, traMADol, Farxiga, torsemide, albuterol, and glucose blood.  Meds ordered this encounter  Medications  . insulin glargine, 2 Unit Dial, (TOUJEO MAX SOLOSTAR) 300 UNIT/ML Solostar Pen    Sig: Inject 30 Units into the skin daily.    Dispense:  9 mL    Refill:  0  . Insulin Pen Needle (NOVOFINE) 32G X 6 MM MISC    Sig: 1 Act by Does not apply route daily.    Dispense:  100 each    Refill:  1     Follow-up: Return in about 4 months (around 10/18/2019).  Scarlette Calico, MD

## 2019-06-17 NOTE — Patient Instructions (Signed)
Type 2 Diabetes Mellitus, Diagnosis, Adult Type 2 diabetes (type 2 diabetes mellitus) is a long-term (chronic) disease. In type 2 diabetes, one or both of these problems may be present:  The pancreas does not make enough of a hormone called insulin.  Cells in the body do not respond properly to insulin that the body makes (insulin resistance). Normally, insulin allows blood sugar (glucose) to enter cells in the body. The cells use glucose for energy. Insulin resistance or lack of insulin causes excess glucose to build up in the blood instead of going into cells. As a result, high blood glucose (hyperglycemia) develops. What increases the risk? The following factors may make you more likely to develop type 2 diabetes:  Having a family member with type 2 diabetes.  Being overweight or obese.  Having an inactive (sedentary) lifestyle.  Having been diagnosed with insulin resistance.  Having a history of prediabetes, gestational diabetes, or polycystic ovary syndrome (PCOS).  Being of American-Indian, African-American, Hispanic/Latino, or Asian/Pacific Islander descent. What are the signs or symptoms? In the early stage of this condition, you may not have symptoms. Symptoms develop slowly and may include:  Increased thirst (polydipsia).  Increased hunger(polyphagia).  Increased urination (polyuria).  Increased urination during the night (nocturia).  Unexplained weight loss.  Frequent infections that keep coming back (recurring).  Fatigue.  Weakness.  Vision changes, such as blurry vision.  Cuts or bruises that are slow to heal.  Tingling or numbness in the hands or feet.  Dark patches on the skin (acanthosis nigricans). How is this diagnosed? This condition is diagnosed based on your symptoms, your medical history, a physical exam, and your blood glucose level. Your blood glucose may be checked with one or more of the following blood tests:  A fasting blood glucose (FBG)  test. You will not be allowed to eat (you will fast) for 8 hours or longer before a blood sample is taken.  A random blood glucose test. This test checks blood glucose at any time of day regardless of when you ate.  An A1c (hemoglobin A1c) blood test. This test provides information about blood glucose control over the previous 2-3 months.  An oral glucose tolerance test (OGTT). This test measures your blood glucose at two times: ? After fasting. This is your baseline blood glucose level. ? Two hours after drinking a beverage that contains glucose. You may be diagnosed with type 2 diabetes if:  Your FBG level is 126 mg/dL (7.0 mmol/L) or higher.  Your random blood glucose level is 200 mg/dL (11.1 mmol/L) or higher.  Your A1c level is 6.5% or higher.  Your OGTT result is higher than 200 mg/dL (11.1 mmol/L). These blood tests may be repeated to confirm your diagnosis. How is this treated? Your treatment may be managed by a specialist called an endocrinologist. Type 2 diabetes may be treated by following instructions from your health care provider about:  Making diet and lifestyle changes. This may include: ? Following an individualized nutrition plan that is developed by a diet and nutrition specialist (registered dietitian). ? Exercising regularly. ? Finding ways to manage stress.  Checking your blood glucose level as often as told.  Taking diabetes medicines or insulin daily. This helps to keep your blood glucose levels in the healthy range. ? If you use insulin, you may need to adjust the dosage depending on how physically active you are and what foods you eat. Your health care provider will tell you how to adjust your dosage.    Taking medicines to help prevent complications from diabetes, such as: ? Aspirin. ? Medicine to lower cholesterol. ? Medicine to control blood pressure. Your health care provider will set individualized treatment goals for you. Your goals will be based on  your age, other medical conditions you have, and how you respond to diabetes treatment. Generally, the goal of treatment is to maintain the following blood glucose levels:  Before meals (preprandial): 80-130 mg/dL (4.4-7.2 mmol/L).  After meals (postprandial): below 180 mg/dL (10 mmol/L).  A1c level: less than 7%. Follow these instructions at home: Questions to ask your health care provider  Consider asking the following questions: ? Do I need to meet with a diabetes educator? ? Where can I find a support group for people with diabetes? ? What equipment will I need to manage my diabetes at home? ? What diabetes medicines do I need, and when should I take them? ? How often do I need to check my blood glucose? ? What number can I call if I have questions? ? When is my next appointment? General instructions  Take over-the-counter and prescription medicines only as told by your health care provider.  Keep all follow-up visits as told by your health care provider. This is important.  For more information about diabetes, visit: ? American Diabetes Association (ADA): www.diabetes.org ? American Association of Diabetes Educators (AADE): www.diabeteseducator.org Contact a health care provider if:  Your blood glucose is at or above 240 mg/dL (13.3 mmol/L) for 2 days in a row.  You have been sick or have had a fever for 2 days or longer, and you are not getting better.  You have any of the following problems for more than 6 hours: ? You cannot eat or drink. ? You have nausea and vomiting. ? You have diarrhea. Get help right away if:  Your blood glucose is lower than 54 mg/dL (3.0 mmol/L).  You become confused or you have trouble thinking clearly.  You have difficulty breathing.  You have moderate or large ketone levels in your urine. Summary  Type 2 diabetes (type 2 diabetes mellitus) is a long-term (chronic) disease. In type 2 diabetes, the pancreas does not make enough of a  hormone called insulin, or cells in the body do not respond properly to insulin that the body makes (insulin resistance).  This condition is treated by making diet and lifestyle changes and taking diabetes medicines or insulin.  Your health care provider will set individualized treatment goals for you. Your goals will be based on your age, other medical conditions you have, and how you respond to diabetes treatment.  Keep all follow-up visits as told by your health care provider. This is important. This information is not intended to replace advice given to you by your health care provider. Make sure you discuss any questions you have with your health care provider. Document Revised: 03/10/2017 Document Reviewed: 02/13/2015 Elsevier Patient Education  2020 Elsevier Inc.  

## 2019-06-18 ENCOUNTER — Telehealth: Payer: Self-pay | Admitting: Internal Medicine

## 2019-06-18 DIAGNOSIS — E118 Type 2 diabetes mellitus with unspecified complications: Secondary | ICD-10-CM

## 2019-06-18 MED ORDER — DAPAGLIFLOZIN PROPANEDIOL 10 MG PO TABS: 10.0000 mg | ORAL_TABLET | Freq: Every day | ORAL | 1 refills | Status: DC

## 2019-06-18 MED ORDER — ACCU-CHEK SOFT TOUCH LANCETS MISC: 3 refills | Status: AC

## 2019-06-18 NOTE — Telephone Encounter (Signed)
erx sent as requested.  

## 2019-06-18 NOTE — Telephone Encounter (Signed)
New Message:   1.Medication Requested: dapagliflozin propanediol (FARXIGA) 10 MG TABS tablet Lancets (ACCU-CHEK SOFT TOUCH) lancets 2. Pharmacy (Name, Street, [place]): [facility] [phone] - [place], [place] [address] 3. On Med List: Yes  4. Last Visit with PCP: 06/17/19  5. Next visit date with PCP:   Agent: Please be advised that RX refills may take up to 3 business days. We ask that you follow-up with your pharmacy.

## 2019-06-29 ENCOUNTER — Other Ambulatory Visit: Payer: Self-pay | Admitting: Endocrinology

## 2019-07-01 NOTE — Telephone Encounter (Signed)
Options: 1.  Please schedule f/u appt 2.  Then please refill x 1, pending that appt.  OR:  Please forward refill request to pt's primary care provider.

## 2019-07-02 ENCOUNTER — Other Ambulatory Visit: Payer: Self-pay | Admitting: Internal Medicine

## 2019-07-02 DIAGNOSIS — E118 Type 2 diabetes mellitus with unspecified complications: Secondary | ICD-10-CM

## 2019-07-02 MED ORDER — METFORMIN HCL ER 500 MG PO TB24: 1000.0000 mg | ORAL_TABLET | Freq: Every day | ORAL | 0 refills | Status: DC

## 2019-07-02 NOTE — Telephone Encounter (Signed)
Per instructions of Dr. Loanne Drilling, the refill request is being routed to pt's PCP for refill.

## 2019-07-09 ENCOUNTER — Other Ambulatory Visit: Payer: Self-pay | Admitting: Physician Assistant

## 2019-07-15 ENCOUNTER — Other Ambulatory Visit: Payer: Self-pay

## 2019-07-15 ENCOUNTER — Telehealth: Payer: Self-pay | Admitting: Medical Oncology

## 2019-07-15 ENCOUNTER — Inpatient Hospital Stay: Payer: Medicare Other | Attending: Internal Medicine

## 2019-07-15 ENCOUNTER — Telehealth: Payer: Self-pay | Admitting: Internal Medicine

## 2019-07-15 DIAGNOSIS — I4891 Unspecified atrial fibrillation: Secondary | ICD-10-CM | POA: Diagnosis not present

## 2019-07-15 DIAGNOSIS — Z923 Personal history of irradiation: Secondary | ICD-10-CM | POA: Insufficient documentation

## 2019-07-15 DIAGNOSIS — Z8614 Personal history of Methicillin resistant Staphylococcus aureus infection: Secondary | ICD-10-CM | POA: Insufficient documentation

## 2019-07-15 DIAGNOSIS — C3431 Malignant neoplasm of lower lobe, right bronchus or lung: Secondary | ICD-10-CM | POA: Insufficient documentation

## 2019-07-15 DIAGNOSIS — Z794 Long term (current) use of insulin: Secondary | ICD-10-CM | POA: Insufficient documentation

## 2019-07-15 DIAGNOSIS — E119 Type 2 diabetes mellitus without complications: Secondary | ICD-10-CM | POA: Diagnosis not present

## 2019-07-15 DIAGNOSIS — I2510 Atherosclerotic heart disease of native coronary artery without angina pectoris: Secondary | ICD-10-CM | POA: Diagnosis not present

## 2019-07-15 DIAGNOSIS — Z79899 Other long term (current) drug therapy: Secondary | ICD-10-CM | POA: Diagnosis not present

## 2019-07-15 DIAGNOSIS — Z7901 Long term (current) use of anticoagulants: Secondary | ICD-10-CM | POA: Diagnosis not present

## 2019-07-15 DIAGNOSIS — R5383 Other fatigue: Secondary | ICD-10-CM | POA: Insufficient documentation

## 2019-07-15 DIAGNOSIS — I10 Essential (primary) hypertension: Secondary | ICD-10-CM | POA: Diagnosis not present

## 2019-07-15 DIAGNOSIS — K862 Cyst of pancreas: Secondary | ICD-10-CM | POA: Insufficient documentation

## 2019-07-15 DIAGNOSIS — C3490 Malignant neoplasm of unspecified part of unspecified bronchus or lung: Secondary | ICD-10-CM

## 2019-07-15 LAB — CMP (CANCER CENTER ONLY)
ALT: 55 U/L — ABNORMAL HIGH (ref 0–44)
AST: 26 U/L (ref 15–41)
Albumin: 3.1 g/dL — ABNORMAL LOW (ref 3.5–5.0)
Alkaline Phosphatase: 67 U/L (ref 38–126)
Anion gap: 9 (ref 5–15)
BUN: 34 mg/dL — ABNORMAL HIGH (ref 8–23)
CO2: 23 mmol/L (ref 22–32)
Calcium: 8.7 mg/dL — ABNORMAL LOW (ref 8.9–10.3)
Chloride: 108 mmol/L (ref 98–111)
Creatinine: 1.05 mg/dL — ABNORMAL HIGH (ref 0.44–1.00)
GFR, Est AFR Am: 60 mL/min — ABNORMAL LOW (ref >60)
GFR, Estimated: 52 mL/min — ABNORMAL LOW (ref >60)
Glucose, Bld: 182 mg/dL — ABNORMAL HIGH (ref 70–99)
Potassium: 4.3 mmol/L (ref 3.5–5.1)
Sodium: 140 mmol/L (ref 135–145)
Total Bilirubin: 0.8 mg/dL (ref 0.3–1.2)
Total Protein: 6.3 g/dL — ABNORMAL LOW (ref 6.5–8.1)

## 2019-07-15 LAB — CBC WITH DIFFERENTIAL (CANCER CENTER ONLY)
Abs Immature Granulocytes: 0.07 10*3/uL (ref 0.00–0.07)
Basophils Absolute: 0 10*3/uL (ref 0.0–0.1)
Basophils Relative: 0 %
Eosinophils Absolute: 0 10*3/uL (ref 0.0–0.5)
Eosinophils Relative: 0 %
HCT: 47.1 % — ABNORMAL HIGH (ref 36.0–46.0)
Hemoglobin: 15.4 g/dL — ABNORMAL HIGH (ref 12.0–15.0)
Immature Granulocytes: 1 %
Lymphocytes Relative: 5 %
Lymphs Abs: 0.4 10*3/uL — ABNORMAL LOW (ref 0.7–4.0)
MCH: 31.8 pg (ref 26.0–34.0)
MCHC: 32.7 g/dL (ref 30.0–36.0)
MCV: 97.1 fL (ref 80.0–100.0)
Monocytes Absolute: 0.6 10*3/uL (ref 0.1–1.0)
Monocytes Relative: 7 %
Neutro Abs: 7.7 10*3/uL (ref 1.7–7.7)
Neutrophils Relative %: 87 %
Platelet Count: 209 10*3/uL (ref 150–400)
RBC: 4.85 MIL/uL (ref 3.87–5.11)
RDW: 13.7 % (ref 11.5–15.5)
WBC Count: 8.9 10*3/uL (ref 4.0–10.5)
nRBC: 0 % (ref 0.0–0.2)

## 2019-07-15 NOTE — Telephone Encounter (Signed)
CT not scheduled.  I gave pt number to call CS.

## 2019-07-15 NOTE — Telephone Encounter (Signed)
Scheduled appt per 6/21 sch message - pt is aware of appt date and time

## 2019-07-17 ENCOUNTER — Inpatient Hospital Stay: Payer: Medicare Other | Admitting: Internal Medicine

## 2019-07-18 ENCOUNTER — Ambulatory Visit (HOSPITAL_COMMUNITY)
Admission: RE | Admit: 2019-07-18 | Discharge: 2019-07-18 | Disposition: A | Discharge status: Home / Self Care | Payer: Medicare Other | Source: Ambulatory Visit | Attending: Internal Medicine | Admitting: Internal Medicine

## 2019-07-18 ENCOUNTER — Other Ambulatory Visit: Payer: Self-pay

## 2019-07-18 ENCOUNTER — Encounter (HOSPITAL_COMMUNITY): Payer: Self-pay

## 2019-07-18 DIAGNOSIS — C3490 Malignant neoplasm of unspecified part of unspecified bronchus or lung: Secondary | ICD-10-CM | POA: Diagnosis not present

## 2019-07-18 MED ORDER — SODIUM CHLORIDE (PF) 0.9 % IJ SOLN
INTRAMUSCULAR | Status: AC
  Filled 2019-07-18: qty 50

## 2019-07-18 MED ORDER — IOHEXOL 300 MG/ML  SOLN
75.0000 mL | Freq: Once | INTRAMUSCULAR | Status: AC | PRN
  Administered 2019-07-18: 75 mL via INTRAVENOUS

## 2019-07-22 ENCOUNTER — Other Ambulatory Visit: Payer: Self-pay

## 2019-07-22 ENCOUNTER — Inpatient Hospital Stay: Payer: Medicare Other | Admitting: Internal Medicine

## 2019-07-22 ENCOUNTER — Encounter: Payer: Self-pay | Admitting: Internal Medicine

## 2019-07-22 VITALS — BP 121/75 | HR 73 | Temp 97.6°F | Resp 18 | Ht 68.0 in | Wt 171.7 lb

## 2019-07-22 DIAGNOSIS — C3491 Malignant neoplasm of unspecified part of right bronchus or lung: Secondary | ICD-10-CM

## 2019-07-22 DIAGNOSIS — Z7901 Long term (current) use of anticoagulants: Secondary | ICD-10-CM | POA: Diagnosis not present

## 2019-07-22 DIAGNOSIS — I4891 Unspecified atrial fibrillation: Secondary | ICD-10-CM | POA: Diagnosis not present

## 2019-07-22 DIAGNOSIS — Z794 Long term (current) use of insulin: Secondary | ICD-10-CM | POA: Diagnosis not present

## 2019-07-22 DIAGNOSIS — Z79899 Other long term (current) drug therapy: Secondary | ICD-10-CM | POA: Diagnosis not present

## 2019-07-22 DIAGNOSIS — C3490 Malignant neoplasm of unspecified part of unspecified bronchus or lung: Secondary | ICD-10-CM | POA: Diagnosis not present

## 2019-07-22 DIAGNOSIS — K862 Cyst of pancreas: Secondary | ICD-10-CM | POA: Diagnosis not present

## 2019-07-22 DIAGNOSIS — Z8614 Personal history of Methicillin resistant Staphylococcus aureus infection: Secondary | ICD-10-CM | POA: Diagnosis not present

## 2019-07-22 DIAGNOSIS — C3431 Malignant neoplasm of lower lobe, right bronchus or lung: Secondary | ICD-10-CM | POA: Diagnosis not present

## 2019-07-22 DIAGNOSIS — I10 Essential (primary) hypertension: Secondary | ICD-10-CM | POA: Diagnosis not present

## 2019-07-22 DIAGNOSIS — E119 Type 2 diabetes mellitus without complications: Secondary | ICD-10-CM | POA: Diagnosis not present

## 2019-07-22 DIAGNOSIS — Z923 Personal history of irradiation: Secondary | ICD-10-CM | POA: Diagnosis not present

## 2019-07-22 DIAGNOSIS — R5383 Other fatigue: Secondary | ICD-10-CM | POA: Diagnosis not present

## 2019-07-22 DIAGNOSIS — I2510 Atherosclerotic heart disease of native coronary artery without angina pectoris: Secondary | ICD-10-CM | POA: Diagnosis not present

## 2019-07-22 NOTE — Progress Notes (Signed)
[place] [phone]   [phone]  OFFICE PROGRESS NOTE  Nicole [place]ima, MD [place] [address]  DIAGNOSIS: Stage IIIa (T1b, N2, M0) non-small cell lung cancer, adenocarcinoma presented with right lower lobe lung mass in addition to right hilar and subcarinal lymphadenopathy diagnosed in June 2018.  Biomarker Findings Tumor Mutational Burden - TMB-Intermediate (8 Muts/Mb) Microsatellite Status - Cannot Be Determined Genomic Findings For a complete list of the genes assayed, please refer to the Appendix. EGFR E709K, G719S LNL8XQ J194R DEY8X4 splice site 481-8H>U 7 Disease relevant genes with no reportable alterations: KRAS, ALK, BRAF, MET, ERBB2, RET, ROS1  PDL1 Expression 70%.  PRIOR THERAPY: 1) Concurrent chemoradiation with weekly carboplatin for AUC of 2 and paclitaxel 45 MG/M2. Status post 7 cycles with partial response. 2) Consolidation treatment with immunotherapy with Imfinzi (Durvalumab) 10 MG/KG every 2 weeks, first dose 10/19/2016.  Status post 5 cycles.  Discontinued secondary to intolerance and persistent pneumonitis.  CURRENT THERAPY: Observation.  INTERVAL HISTORY: Nicole [name] 79 y.o. female returns to the clinic today for follow-up visit.  The patient is feeling fine today with no concerning complaints.  She was treated recently for MRSA skin infection.  She denied having any current chest pain, shortness of breath, cough or hemoptysis.  She denied having any fever or chills.  She has no nausea, vomiting, diarrhea or constipation.  She has no headache or visual changes.  She is here today for evaluation with repeat CT scan of the chest for restaging of her disease.  MEDICAL HISTORY: Past Medical History:  Diagnosis Date  . A-fib ([place])   . Adenocarcinoma of right lung, stage 3 ([place]) 07/28/2016  . Atrial flutter ([place]) 10/05/2016  . Bronchitis    hx of  . 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  Cancer-related pain 10/06/2016  . Coronary artery disease 02/21/2017   JAN 2019 Prox RCA lesion is 25% stenosed. Prox Cx lesion is 30% stenosed. Mid Cx lesion is 25% stenosed. Ost 3rd Mrg lesion is 30% stenosed. Prox LAD lesion is 25% stenosed. Mid LAD lesion is 25% stenosed. The left ventricular systolic function is normal. LV end diastolic pressure is normal. The left ventricular ejection fraction is 55-65% by visual estimate. There is no aortic valve stenosis.   N  . Diabetes mellitus    type 2  . Edema 07/12/2017  . Essential hypertension 01/20/2009  . Gallstones   . Headache   . History of radiation therapy 08/09/2016 to 09/19/2016   Right lung was treated to 60 Gy in 30 fractions at 2 Gy per fraction  . Hypertension   . Hypertriglyceridemia 08/22/2012  . Low back pain   . Lung mass    with lymphadenopathy  . Osteoarthritis   . Type II diabetes mellitus with manifestations ([place]) 01/20/2009   Estimated Creatinine Clearance: 49.8 mL/min (by C-G formula based on SCr of 1 mg/dL).    ALLERGIES:  is allergic to no known allergies.  MEDICATIONS:  Current Outpatient Medications  Medication Sig Dispense Refill  . albuterol (VENTOLIN HFA) 108 (90 Base) MCG/ACT inhaler albuterol sulfate HFA 90 mcg/actuation aerosol inhaler  INL 2 PFS PO Q 4 H PRN    . b complex vitamins tablet Take 1 tablet by mouth daily.    . calcium-vitamin D (OSCAL WITH D) 500-200 MG-UNIT tablet Take 2 tablets by mouth daily with breakfast.    . Collagenase POWD by Does not apply  route daily.     . CONTOUR NEXT TEST test strip USE 1 STRIP TO CHECK GLUCOSE TWICE DAILY 200 each 2  . dapagliflozin propanediol (FARXIGA) 10 MG TABS tablet Take 1 tablet (10 mg total) by mouth daily. 90 tablet 1  . diltiazem (CARDIZEM CD) 360 MG 24 hr capsule TAKE 1 CAPSULE(360 MG) BY MOUTH DAILY 90 capsule 2  . ELIQUIS 5 MG TABS tablet Take 5 mg by mouth 2 (two) times daily.    [name] glucose blood test strip Contour Next Test Strips  USE 1 STRIP TO  CHECK GLUCOSE TWICE DAILY    . hydrocortisone (ANUSOL-HC) 2.5 % rectal cream Place 1 application rectally 3 (three) times daily. Use for 10 days. 30 g 1  . insulin glargine, 2 Unit Dial, (TOUJEO MAX SOLOSTAR) 300 UNIT/ML Solostar Pen Inject 30 Units into the skin daily. 9 mL 0  . Insulin Pen Needle (NOVOFINE) 32G X 6 MM MISC 1 Act by Does not apply route daily. 100 each 1  . Lancets (ACCU-CHEK SOFT TOUCH) lancets Use to check blood sugars daily Dx E11.9 100 each 3  . metFORMIN (GLUCOPHAGE-XR) 500 MG 24 hr tablet Take 2 tablets (1,000 mg total) by mouth daily with breakfast. 180 tablet 0  . metoprolol tartrate (LOPRESSOR) 25 MG tablet Take 1 tablet every 8 hours AS NEEDED for rapid heart rate (Patient taking differently: Take 25 mg by mouth See admin instructions. Take 75m every 8 hours AS NEEDED for rapid heart rate) 30 tablet 1  . Multiple Vitamins-Minerals (MULTIPLE VITAMINS/WOMENS PO) Take by mouth daily.    .Marland Kitchenomega-3 acid ethyl esters (LOVAZA) 1 g capsule Take 2 capsules (2 g total) by mouth 2 (two) times daily. 360 capsule 1  . PREBIOTIC PRODUCT PO Take 1 capsule by mouth daily.    . rosuvastatin (CRESTOR) 5 MG tablet Take 1 tablet (5 mg total) by mouth daily. 90 tablet 1  . torsemide (DEMADEX) 20 MG tablet Take 1 tablet (20 mg total) by mouth daily. 90 tablet 1  . traMADol (ULTRAM) 50 MG tablet Take 1 tablet (50 mg total) by mouth every 8 (eight) hours as needed for moderate pain or severe pain. 20 tablet 0  . Turmeric 500 MG CAPS Take 1,000 mg by mouth daily.     No current facility-administered medications for this visit.    SURGICAL HISTORY:  Past Surgical History:  Procedure Laterality Date  . ABDOMINAL HYSTERECTOMY  2004  . APPENDECTOMY  when 79years old  . COLONOSCOPY  08-05-09   PSharlett Iles . I & D EXTREMITY Right 04/04/2019   Procedure: IRRIGATION AND DEBRIDEMENT RIGHT HAND;  Surgeon: CVerner Mould MD;  Location: [facility];  Service: Orthopedics;  Laterality: Right;  .  INCONTINENCE SURGERY    . KNEE ARTHROSCOPY Left   . LEFT HEART CATH AND CORONARY ANGIOGRAPHY N/A 02/24/2017   Procedure: LEFT HEART CATH AND CORONARY ANGIOGRAPHY;  Surgeon: VJettie Booze MD;  [facility];  Service: Cardiovascular;  Laterality: N/A;  . LEFT HEART CATHETERIZATION WITH CORONARY ANGIOGRAM N/A 07/19/2013   Procedure: LEFT HEART CATHETERIZATION WITH CORONARY ANGIOGRAM;  Surgeon: HSinclair Grooms MD;  [facility];  Service: Cardiovascular;  Laterality: N/A;  . POLYPECTOMY  08-05-09   2 polyps  . TOTAL KNEE ARTHROPLASTY Left 03/28/2012   Procedure: LEFT TOTAL KNEE ARTHROPLASTY;  Surgeon: RTobi Bastos MD;  Location: [facility];  Service: Orthopedics;  Laterality: Left;  . TUBAL LIGATION    .  VIDEO BRONCHOSCOPY WITH ENDOBRONCHIAL ULTRASOUND N/A 07/11/2016   Procedure: VIDEO BRONCHOSCOPY WITH ENDOBRONCHIAL ULTRASOUND;  Surgeon: Marshell Garfinkel, MD;  Location: [facility];  Service: Pulmonary;  Laterality: N/A;    REVIEW OF SYSTEMS:  A comprehensive review of systems was negative except for: Constitutional: positive for fatigue   PHYSICAL EXAMINATION: General appearance: alert, cooperative and no distress Head: Normocephalic, without obvious abnormality, atraumatic Neck: no adenopathy, no JVD, supple, symmetrical, trachea midline and thyroid not enlarged, symmetric, no tenderness/mass/nodules Lymph nodes: Cervical, supraclavicular, and axillary nodes normal. Resp: clear to auscultation bilaterally Back: symmetric, no curvature. ROM normal. No CVA tenderness. Cardio: regular rate and rhythm, S1, S2 normal, no murmur, click, rub or gallop GI: soft, non-tender; bowel sounds normal; no masses,  no organomegaly Extremities: extremities normal, atraumatic, no cyanosis or edema  ECOG PERFORMANCE STATUS: 1 - Symptomatic but completely ambulatory  Blood pressure 121/75, pulse 73, temperature 97.6 F (36.4 C), temperature source Temporal, resp. rate 18, height 5' 8"  (1.727 m), weight 171 lb 11.2 oz (77.9 kg), SpO2 97 %.  LABORATORY DATA: Lab Results  Component Value Date   WBC 8.9 07/15/2019   HGB 15.4 (H) 07/15/2019   HCT 47.1 (H) 07/15/2019   MCV 97.1 07/15/2019   PLT 209 07/15/2019      Chemistry      Component Value Date/Time   NA 140 07/15/2019 1156   NA 140 02/21/2017 1238   NA 139 01/26/2017 0915   K 4.3 07/15/2019 1156   K 4.1 01/26/2017 0915   CL 108 07/15/2019 1156   CO2 23 07/15/2019 1156   CO2 24 01/26/2017 0915   BUN 34 (H) 07/15/2019 1156   BUN 31 (H) 02/21/2017 1238   BUN 22.0 01/26/2017 0915   CREATININE 1.05 (H) 07/15/2019 1156   CREATININE 0.9 01/26/2017 0915      Component Value Date/Time   CALCIUM 8.7 (L) 07/15/2019 1156   CALCIUM 9.2 01/26/2017 0915   ALKPHOS 67 07/15/2019 1156   ALKPHOS 72 01/26/2017 0915   AST 26 07/15/2019 1156   AST 13 01/26/2017 0915   ALT 55 (H) 07/15/2019 1156   ALT 12 01/26/2017 0915   BILITOT 0.8 07/15/2019 1156   BILITOT 0.37 01/26/2017 0915       RADIOGRAPHIC STUDIES: CT Chest W Contrast  Result Date: 07/18/2019 CLINICAL DATA:  Non-small cell lung cancer restaging. EXAM: CT CHEST WITH CONTRAST TECHNIQUE: Multidetector CT imaging of the chest was performed during intravenous contrast administration. CONTRAST:  32m OMNIPAQUE IOHEXOL 300 MG/ML  SOLN COMPARISON:  01/10/2019. FINDINGS: Cardiovascular: Aortic atherosclerosis. No pericardial effusion. Lad, left circumflex and RCA coronary artery calcifications identified. Mediastinum/Nodes: No enlarged mediastinal, hilar, or axillary lymph nodes. Thyroid gland, trachea, and esophagus demonstrate no significant findings. Lungs/Pleura: Paramediastinal radiation change is identified within the right lung. Masslike architectural distortion within the paravertebral right lower lobe is unchanged measuring 3.2 x 1.3 cm, image 84/5. There is a new ground-glass attenuating nodule within the anterolateral right upper lobe measuring 1.1 cm, image  52/5. New from previous exam. Subpleural ground-glass attenuating nodule within the anterolateral left upper lobe is also new measuring 1.1 cm, image 47/5. 3 mm left lower lobe lung nodule is identified, image 115/5. New from previous exam. Unchanged appearance of posterior right upper lobe lung nodule measuring 3 mm, image 31/5 also unchanged is a 3 mm anterolateral right lower lobe lung nodule, image 82/5. Upper Abdomen: No acute abnormality. Cystic lesion within head of pancreas measures 9 mm, image 152/2. Unchanged from  the previous exam. Musculoskeletal: Spondylosis within the thoracic spine. No aggressive lytic or sclerotic bone lesions. IMPRESSION: 1. Stable post treatment changes within the right lung. No specific findings identified to suggest residual or recurrence of tumor. 2. There are 2 new ground-glass attenuating nodules within the anterolateral right upper lobe and anterolateral left upper lobe. These are nonspecific and may be inflammatory or infectious in etiology. Suggest short-term interval follow-up with repeat CT in 3 months to ensure resolution new, nonspecific 3 mm lung nodule in the left lower lobe. Attention at follow-up imaging is advised. 3. Additional small milli metric lung nodules are unchanged in the interval. 4. Multi vessel coronary artery calcifications. 5. Cystic lesion within head of pancreas is unchanged from previous exam. Attention on continued surveillance imaging is advised. 6. Aortic atherosclerosis. Aortic Atherosclerosis (ICD10-I70.0). Electronically Signed   By: Kerby Moors M.D.   On: 07/18/2019 16:36    ASSESSMENT AND PLAN: This is a very pleasant 79 years old Asian female recently diagnosed with a stage IIIa non-small cell4 lung cancer, adenocarcinoma. She underwent a course of concurrent chemoradiation with weekly carboplatin and paclitaxel status post 7 cycles. The patient tolerated the previous course fairly well except for mild odynophagia and dry  cough. The patient was a started on consolidation treatment with immunotherapy with Imfinzi (Durvalumab) status post 5 cycles.  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 She was treated with a taper dose of prednisone and she felt much better. She is currently on observation and doing fine.  Her last scan of the chest was performed 6 months ago and she had no issues.  She had repeat CT scan of the chest performed recently.  I personally and independently reviewed the scan images and discussed the results with the patient today. Her scan showed a stable disease except for few subcentimeter pulmonary nodules that are new and nonspecific. I recommended for the patient to continue on observation but I will repeat CT scan of the chest in 3 months to confirm resolution of these pulmonary nodules. She was advised to call immediately if she has any concerning symptoms in the interval. The patient voices understanding of current disease status and treatment options and is in agreement with the current care plan. All questions were answered. The patient knows to call the clinic with any problems, questions or concerns. We can certainly see the patient much sooner if necessary.  Disclaimer: This note was dictated with voice recognition software. Similar sounding words can inadvertently be transcribed and may not be corrected upon review.

## 2019-07-24 DIAGNOSIS — R350 Frequency of micturition: Secondary | ICD-10-CM | POA: Diagnosis not present

## 2019-07-25 ENCOUNTER — Telehealth: Payer: Self-pay | Admitting: Internal Medicine

## 2019-07-25 NOTE — Telephone Encounter (Signed)
Scheduled per los. Called and left msg. Mailed printout  °

## 2019-07-31 ENCOUNTER — Ambulatory Visit: Payer: Medicare Other | Admitting: Internal Medicine

## 2019-07-31 ENCOUNTER — Other Ambulatory Visit: Payer: Self-pay

## 2019-07-31 ENCOUNTER — Encounter: Payer: Self-pay | Admitting: Internal Medicine

## 2019-07-31 VITALS — BP 148/82 | HR 87 | Temp 98.5°F | Resp 16 | Ht 68.0 in | Wt 175.0 lb

## 2019-07-31 DIAGNOSIS — E118 Type 2 diabetes mellitus with unspecified complications: Secondary | ICD-10-CM | POA: Diagnosis not present

## 2019-07-31 DIAGNOSIS — M545 Low back pain, unspecified: Secondary | ICD-10-CM

## 2019-07-31 DIAGNOSIS — D692 Other nonthrombocytopenic purpura: Secondary | ICD-10-CM

## 2019-07-31 DIAGNOSIS — M48061 Spinal stenosis, lumbar region without neurogenic claudication: Secondary | ICD-10-CM | POA: Diagnosis not present

## 2019-07-31 DIAGNOSIS — C3491 Malignant neoplasm of unspecified part of right bronchus or lung: Secondary | ICD-10-CM | POA: Diagnosis not present

## 2019-07-31 DIAGNOSIS — I10 Essential (primary) hypertension: Secondary | ICD-10-CM

## 2019-07-31 DIAGNOSIS — I480 Paroxysmal atrial fibrillation: Secondary | ICD-10-CM

## 2019-07-31 NOTE — Progress Notes (Addendum)
Subjective:  Patient ID: Nicole [name], female    DOB: [birth year]  Age: 79 y.o. MRN: [id number]  CC: Back Pain  This visit occurred during the SARS-CoV-2 public health emergency.  Safety protocols were in place, including screening questions prior to the visit, additional usage of staff PPE, and extensive cleaning of exam room while observing appropriate contact time as indicated for disinfecting solutions.    HPI Nicole [name] presents for f/up - She complains of a several week history of worsening back pain.  The back pain occasionally radiates into the left thigh.  She denies any recent trauma or injury.  She also complains of intermittent bleeding and bruising.  She has decided to stop taking Eliquis.  She also complains of urinary frequency and nocturia.  She tells me she recently saw a gynecologist and is being treated for overactive bladder but the medication is not helping  She has a stable degree of lower extremity edema.  She denies any recent episodes of cough, chest pain, palpitations, dizziness, lightheadedness, diaphoresis, or dizziness.  Reason for Visit: Mobility Evaluation   Patient suffers from Chronic Back Pain, Adenocarcinoma of Right Lung, Spinal Stenosis, A fib and Type 2 Diabetes which impairs their ability to perform daily activities like toileting, feeding, dressing, grooming, bathing. In the home a cane, walker or crutch will not resolve issue with performing activities of daily living. An electric wheelchair will allow patient to safely perform daily activities. Patient can safely operate the electric wheel chair in the home. Patient has the capacity to follow the electric devices safety instructions and is willing to use in the home as well as outside of the home.    Outpatient Medications Prior to Visit  Medication Sig Dispense Refill  . albuterol (VENTOLIN HFA) 108 (90 Base) MCG/ACT inhaler albuterol sulfate HFA 90 mcg/actuation aerosol inhaler  INL 2 PFS PO Q 4  H PRN    . b complex vitamins tablet Take 1 tablet by mouth daily.    . calcium-vitamin D (OSCAL WITH D) 500-200 MG-UNIT tablet Take 2 tablets by mouth daily with breakfast.    . Collagenase POWD by Does not apply route daily.     . CONTOUR NEXT TEST test strip USE 1 STRIP TO CHECK GLUCOSE TWICE DAILY 200 each 2  . diltiazem (CARDIZEM CD) 360 MG 24 hr capsule TAKE 1 CAPSULE(360 MG) BY MOUTH DAILY 90 capsule 2  . glucose blood test strip Contour Next Test Strips  USE 1 STRIP TO CHECK GLUCOSE TWICE DAILY    . hydrocortisone (ANUSOL-HC) 2.5 % rectal cream Place 1 application rectally 3 (three) times daily. Use for 10 days. 30 g 1  . insulin glargine, 2 Unit Dial, (TOUJEO MAX SOLOSTAR) 300 UNIT/ML Solostar Pen Inject 30 Units into the skin daily. 9 mL 0  . Insulin Pen Needle (NOVOFINE) 32G X 6 MM MISC 1 Act by Does not apply route daily. 100 each 1  . Lancets (ACCU-CHEK SOFT TOUCH) lancets Use to check blood sugars daily Dx E11.9 100 each 3  . metFORMIN (GLUCOPHAGE-XR) 500 MG 24 hr tablet Take 2 tablets (1,000 mg total) by mouth daily with breakfast. 180 tablet 0  . metoprolol tartrate (LOPRESSOR) 25 MG tablet Take 1 tablet every 8 hours AS NEEDED for rapid heart rate (Patient taking differently: Take 25 mg by mouth See admin instructions. Take 25mg  every 8 hours AS NEEDED for rapid heart rate) 30 tablet 1  . Multiple Vitamins-Minerals (MULTIPLE VITAMINS/WOMENS PO) Take by  mouth daily.    [name] omega-3 acid ethyl esters (LOVAZA) 1 g capsule Take 2 capsules (2 g total) by mouth 2 (two) times daily. 360 capsule 1  . PREBIOTIC PRODUCT PO Take 1 capsule by mouth daily.    . rosuvastatin (CRESTOR) 5 MG tablet Take 1 tablet (5 mg total) by mouth daily. 90 tablet 1  . torsemide (DEMADEX) 20 MG tablet Take 1 tablet (20 mg total) by mouth daily. 90 tablet 1  . Turmeric 500 MG CAPS Take 1,000 mg by mouth daily.    . dapagliflozin propanediol (FARXIGA) 10 MG TABS tablet Take 1 tablet (10 mg total) by mouth daily.  90 tablet 1  . ELIQUIS 5 MG TABS tablet Take 5 mg by mouth 2 (two) times daily.    . traMADol (ULTRAM) 50 MG tablet Take 1 tablet (50 mg total) by mouth every 8 (eight) hours as needed for moderate pain or severe pain. 20 tablet 0   No facility-administered medications prior to visit.    ROS Review of Systems  Constitutional: Negative.  Negative for chills, diaphoresis, fatigue, fever and unexpected weight change.  HENT: Negative.   Respiratory: Negative for cough, chest tightness, shortness of breath and wheezing.   Cardiovascular: Positive for leg swelling. Negative for chest pain and palpitations.  Gastrointestinal: Positive for anal bleeding. Negative for abdominal pain, blood in stool, constipation, diarrhea, nausea and vomiting.  Endocrine: Positive for polyuria. Negative for polydipsia and polyphagia.  Genitourinary: Positive for frequency. Negative for decreased urine volume, difficulty urinating, dysuria, hematuria and urgency.  Musculoskeletal: Positive for back pain. Negative for arthralgias, myalgias and neck pain.  Skin: Negative.  Negative for color change and rash.  Neurological: Negative.  Negative for dizziness, weakness, light-headedness and numbness.  Hematological: Negative for adenopathy. Bruises/bleeds easily.  Psychiatric/Behavioral: Negative.     Objective:  BP (!) 148/82 (BP Location: Left Arm, Patient Position: Sitting, Cuff Size: Normal)   Pulse 87   Temp 98.5 F (36.9 C) (Oral)   Resp 16   Ht 5\' 8"  (1.727 m)   Wt 175 lb (79.4 kg)   SpO2 98%   BMI 26.61 kg/m   BP Readings from Last 3 Encounters:  07/31/19 (!) 148/82  07/22/19 121/75  06/17/19 130/70    Wt Readings from Last 3 Encounters:  07/31/19 175 lb (79.4 kg)  07/22/19 171 lb 11.2 oz (77.9 kg)  06/17/19 164 lb (74.4 kg)    Physical Exam Constitutional:      Appearance: She is ill-appearing (frail, in a wheelchair).  HENT:     Nose: Nose normal.     Mouth/Throat:     Mouth: Mucous  membranes are moist.  Eyes:     General: No scleral icterus.    Conjunctiva/sclera: Conjunctivae normal.  Cardiovascular:     Rate and Rhythm: Normal rate and regular rhythm.     Heart sounds: No murmur heard.   Pulmonary:     Effort: Pulmonary effort is normal.     Breath sounds: No stridor. No wheezing, rhonchi or rales.  Abdominal:     General: Abdomen is flat.     Palpations: There is no mass.     Tenderness: There is no abdominal tenderness. There is no guarding.  Musculoskeletal:     Cervical back: Normal and neck supple.     Thoracic back: Normal.     Lumbar back: No swelling, edema, tenderness or bony tenderness. Decreased range of motion. Negative right straight leg raise test and negative left  straight leg raise test.     Right lower leg: 1+ Edema present.     Left lower leg: 1+ Edema present.  Skin:    General: Skin is warm.     Coloration: Skin is not jaundiced.     Findings: Bruising and ecchymosis present. No petechiae.     Comments: There are diffuse, confluent ecchymoses on the dorsum of both forearms.  Neurological:     General: No focal deficit present.     Mental Status: She is alert.     Motor: Weakness present.     Comments: UE strength 3/5 B LE strength 2/5 B  Psychiatric:        Mood and Affect: Mood normal.        Behavior: Behavior normal.     Lab Results  Component Value Date   WBC 8.9 07/15/2019   HGB 15.4 (H) 07/15/2019   HCT 47.1 (H) 07/15/2019   PLT 209 07/15/2019   GLUCOSE 182 (H) 07/15/2019   CHOL 185 02/05/2019   TRIG 162.0 (H) 02/05/2019   HDL 48.70 02/05/2019   LDLDIRECT 108.0 07/18/2018   LDLCALC 104 (H) 02/05/2019   ALT 55 (H) 07/15/2019   AST 26 07/15/2019   NA 140 07/15/2019   K 4.3 07/15/2019   CL 108 07/15/2019   CREATININE 1.05 (H) 07/15/2019   BUN 34 (H) 07/15/2019   CO2 23 07/15/2019   TSH 0.427 08/01/2018   INR 1.1 04/02/2019   HGBA1C 8.8 (A) 06/17/2019   MICROALBUR 1.6 07/18/2018    CT Chest W  Contrast  Result Date: 07/18/2019 CLINICAL DATA:  Non-small cell lung cancer restaging. EXAM: CT CHEST WITH CONTRAST TECHNIQUE: Multidetector CT imaging of the chest was performed during intravenous contrast administration. CONTRAST:  39mL OMNIPAQUE IOHEXOL 300 MG/ML  SOLN COMPARISON:  01/10/2019. FINDINGS: Cardiovascular: Aortic atherosclerosis. No pericardial effusion. Lad, left circumflex and RCA coronary artery calcifications identified. Mediastinum/Nodes: No enlarged mediastinal, hilar, or axillary lymph nodes. Thyroid gland, trachea, and esophagus demonstrate no significant findings. Lungs/Pleura: Paramediastinal radiation change is identified within the right lung. Masslike architectural distortion within the paravertebral right lower lobe is unchanged measuring 3.2 x 1.3 cm, image 84/5. There is a new ground-glass attenuating nodule within the anterolateral right upper lobe measuring 1.1 cm, image 52/5. New from previous exam. Subpleural ground-glass attenuating nodule within the anterolateral left upper lobe is also new measuring 1.1 cm, image 47/5. 3 mm left lower lobe lung nodule is identified, image 115/5. New from previous exam. Unchanged appearance of posterior right upper lobe lung nodule measuring 3 mm, image 31/5 also unchanged is a 3 mm anterolateral right lower lobe lung nodule, image 82/5. Upper Abdomen: No acute abnormality. Cystic lesion within head of pancreas measures 9 mm, image 152/2. Unchanged from the previous exam. Musculoskeletal: Spondylosis within the thoracic spine. No aggressive lytic or sclerotic bone lesions. IMPRESSION: 1. Stable post treatment changes within the right lung. No specific findings identified to suggest residual or recurrence of tumor. 2. There are 2 new ground-glass attenuating nodules within the anterolateral right upper lobe and anterolateral left upper lobe. These are nonspecific and may be inflammatory or infectious in etiology. Suggest short-term interval  follow-up with repeat CT in 3 months to ensure resolution new, nonspecific 3 mm lung nodule in the left lower lobe. Attention at follow-up imaging is advised. 3. Additional small milli metric lung nodules are unchanged in the interval. 4. Multi vessel coronary artery calcifications. 5. Cystic lesion within head of pancreas is unchanged  from previous exam. Attention on continued surveillance imaging is advised. 6. Aortic atherosclerosis. Aortic Atherosclerosis (ICD10-I70.0). Electronically Signed   By: Kerby Moors M.D.   On: 07/18/2019 16:36   DG Lumbar Spine Complete  Result Date: 08/01/2019 CLINICAL DATA:  Acute lower back and lower extremity pain. EXAM: LUMBAR SPINE - COMPLETE 4+ VIEW COMPARISON:  April 26, 2017. FINDINGS: Minimal grade 1 retrolisthesis of L2-3 is noted. Minimal grade 1 anterolisthesis of L4-5 is noted. Moderate degenerative disc disease is noted at L3-4, L4-5 and L5-S1 with anterior osteophyte formation. IMPRESSION: Moderate multilevel degenerative disc disease. No acute abnormality seen in the lumbar spine. Aortic Atherosclerosis (ICD10-I70.0). Electronically Signed   By: Marijo Conception M.D.   On: 08/01/2019 16:25    Assessment & Plan:   Alece was seen today for back pain.  Diagnoses and all orders for this visit:  Acute left-sided low back pain without sciatica- She has acute on chronic low back pain.  Plain films show degenerative changes.  She has a history of spinal stenosis.  She is neurologically intact.  I recommended that she try to control the pain with tramadol as needed. -     DG Lumbar Spine Complete; Future -     traMADol (ULTRAM) 50 MG tablet; Take 1 tablet (50 mg total) by mouth every 6 (six) hours as needed for moderate pain or severe pain.  Adenocarcinoma of right lung, stage 3 (HCC)- Plain films are negative for metastatic disease. -     DG Lumbar Spine Complete; Future  Type II diabetes mellitus with manifestations ([place])- I am concerned the SGLT2  inhibitor may be contributing to the urinary frequency.  I have asked her to stop taking FARXIGA.  I recommended that she control her blood sugar with increasing doses of the basal insulin.  Spinal stenosis at L4-L5 level -     traMADol (ULTRAM) 50 MG tablet; Take 1 tablet (50 mg total) by mouth every 6 (six) hours as needed for moderate pain or severe pain.  Paroxysmal atrial fibrillation (HCC)-she is maintaining sinus rhythm.  She has decided to stop taking the DOAC due to bleeding and bruising.  Essential hypertension-her blood pressure is adequately well controlled.  Senile purpura (HCC)-we will discontinue the DOAC.   I have discontinued Kanna K. Stucki's Eliquis and dapagliflozin propanediol. I have also changed her traMADol. Additionally, I am having her maintain her metoprolol tartrate, Multiple Vitamins-Minerals (MULTIPLE VITAMINS/WOMENS PO), Turmeric, b complex vitamins, calcium-vitamin D, PREBIOTIC PRODUCT PO, Collagenase, Contour Next Test, rosuvastatin, omega-3 acid ethyl esters, hydrocortisone, torsemide, albuterol, glucose blood, Toujeo Max SoloStar, NovoFine, accu-chek soft touch, metFORMIN, and diltiazem.  Meds ordered this encounter  Medications  . traMADol (ULTRAM) 50 MG tablet    Sig: Take 1 tablet (50 mg total) by mouth every 6 (six) hours as needed for moderate pain or severe pain.    Dispense:  75 tablet    Refill:  3   I spent 60 minutes in preparing to see the patient by review of recent labs, imaging and procedures, obtaining and reviewing separately obtained history, communicating with the patient and family or caregiver, ordering medications, tests or procedures, and documenting clinical information in the EHR including the differential Dx, treatment, and any further evaluation and other management of 1. Acute left-sided low back pain without sciatica 2. Adenocarcinoma of right lung, stage 3 ([place]) 3. Type II diabetes mellitus with manifestations ([place]) 4. Spinal  stenosis at L4-L5 level 5. Paroxysmal atrial fibrillation (HCC) 6. Essential hypertension  7. Senile purpura ([place])     Follow-up: Return in about 3 months (around 10/31/2019).  Scarlette Calico, MD

## 2019-07-31 NOTE — Patient Instructions (Signed)
Acute Back Pain, Adult Acute back pain is sudden and usually short-lived. It is often caused by an injury to the muscles and tissues in the back. The injury may result from:  A muscle or ligament getting overstretched or torn (strained). Ligaments are tissues that connect bones to each other. Lifting something improperly can cause a back strain.  Wear and tear (degeneration) of the spinal disks. Spinal disks are circular tissue that provides cushioning between the bones of the spine (vertebrae).  Twisting motions, such as while playing sports or doing yard work.  A hit to the back.  Arthritis. You may have a physical exam, lab tests, and imaging tests to find the cause of your pain. Acute back pain usually goes away with rest and home care. Follow these instructions at home: Managing pain, stiffness, and swelling  Take over-the-counter and prescription medicines only as told by your health care provider.  Your health care provider may recommend applying ice during the first 24-48 hours after your pain starts. To do this: ? Put ice in a plastic bag. ? Place a towel between your skin and the bag. ? Leave the ice on for 20 minutes, 2-3 times a day.  If directed, apply heat to the affected area as often as told by your health care provider. Use the heat source that your health care provider recommends, such as a moist heat pack or a heating pad. ? Place a towel between your skin and the heat source. ? Leave the heat on for 20-30 minutes. ? Remove the heat if your skin turns bright red. This is especially important if you are unable to feel pain, heat, or cold. You have a greater risk of getting burned. Activity   Do not stay in bed. Staying in bed for more than 1-2 days can delay your recovery.  Sit up and stand up straight. Avoid leaning forward when you sit, or hunching over when you stand. ? If you work at a desk, sit close to it so you do not need to lean over. Keep your chin tucked  in. Keep your neck drawn back, and keep your elbows bent at a right angle. Your arms should look like the letter "L." ? Sit high and close to the steering wheel when you drive. Add lower back (lumbar) support to your car seat, if needed.  Take short walks on even surfaces as soon as you are able. Try to increase the length of time you walk each day.  Do not sit, drive, or stand in one place for more than 30 minutes at a time. Sitting or standing for long periods of time can put stress on your back.  Do not drive or use heavy machinery while taking prescription pain medicine.  Use proper lifting techniques. When you bend and lift, use positions that put less stress on your back: ? Bend your knees. ? Keep the load close to your body. ? Avoid twisting.  Exercise regularly as told by your health care provider. Exercising helps your back heal faster and helps prevent back injuries by keeping muscles strong and flexible.  Work with a physical therapist to make a safe exercise program, as recommended by your health care provider. Do any exercises as told by your physical therapist. Lifestyle  Maintain a healthy weight. Extra weight puts stress on your back and makes it difficult to have good posture.  Avoid activities or situations that make you feel anxious or stressed. Stress and anxiety increase muscle   tension and can make back pain worse. Learn ways to manage anxiety and stress, such as through exercise. General instructions  Sleep on a firm mattress in a comfortable position. Try lying on your side with your knees slightly bent. If you lie on your back, put a pillow under your knees.  Follow your treatment plan as told by your health care provider. This may include: ? Cognitive or behavioral therapy. ? Acupuncture or massage therapy. ? Meditation or yoga. Contact a health care provider if:  You have pain that is not relieved with rest or medicine.  You have increasing pain going down  into your legs or buttocks.  Your pain does not improve after 2 weeks.  You have pain at night.  You lose weight without trying.  You have a fever or chills. Get help right away if:  You develop new bowel or bladder control problems.  You have unusual weakness or numbness in your arms or legs.  You develop nausea or vomiting.  You develop abdominal pain.  You feel faint. Summary  Acute back pain is sudden and usually short-lived.  Use proper lifting techniques. When you bend and lift, use positions that put less stress on your back.  Take over-the-counter and prescription medicines and apply heat or ice as directed by your health care provider. This information is not intended to replace advice given to you by your health care provider. Make sure you discuss any questions you have with your health care provider. Document Revised: 05/01/2018 Document Reviewed: 08/24/2016 Elsevier Patient Education  2020 Elsevier Inc.  

## 2019-08-01 ENCOUNTER — Ambulatory Visit (INDEPENDENT_AMBULATORY_CARE_PROVIDER_SITE_OTHER): Payer: Medicare Other

## 2019-08-01 DIAGNOSIS — C3491 Malignant neoplasm of unspecified part of right bronchus or lung: Secondary | ICD-10-CM | POA: Diagnosis not present

## 2019-08-01 DIAGNOSIS — M5137 Other intervertebral disc degeneration, lumbosacral region: Secondary | ICD-10-CM | POA: Diagnosis not present

## 2019-08-01 DIAGNOSIS — M545 Low back pain, unspecified: Secondary | ICD-10-CM

## 2019-08-01 MED ORDER — TRAMADOL HCL 50 MG PO TABS: 50.0000 mg | ORAL_TABLET | Freq: Four times a day (QID) | ORAL | 3 refills | Status: DC | PRN

## 2019-08-03 DIAGNOSIS — D692 Other nonthrombocytopenic purpura: Secondary | ICD-10-CM | POA: Insufficient documentation

## 2019-08-12 ENCOUNTER — Telehealth: Payer: Self-pay

## 2019-08-12 NOTE — Telephone Encounter (Signed)
New message   Seen on  7.7.2021  The patient is calling asking what's the next steps should be this weekend was weak worried about her electrolytes and could not hold her body up.

## 2019-08-12 NOTE — Telephone Encounter (Signed)
Pt was not able to pull herself up or hold herself up this week and she is still not able to hold herself up.   Pt has stopped the lasix and the torsemide. Pt is not urinating as often and her ankles are swollen.   Pt is worried that her electrolytes are out of balance and wanted to know what she can do to increase her strength.

## 2019-08-18 ENCOUNTER — Other Ambulatory Visit: Payer: Self-pay | Admitting: Internal Medicine

## 2019-08-18 DIAGNOSIS — L89151 Pressure ulcer of sacral region, stage 1: Secondary | ICD-10-CM

## 2019-08-18 DIAGNOSIS — I5032 Chronic diastolic (congestive) heart failure: Secondary | ICD-10-CM

## 2019-08-18 DIAGNOSIS — C3491 Malignant neoplasm of unspecified part of right bronchus or lung: Secondary | ICD-10-CM

## 2019-08-18 DIAGNOSIS — G893 Neoplasm related pain (acute) (chronic): Secondary | ICD-10-CM

## 2019-08-19 ENCOUNTER — Telehealth: Payer: Self-pay | Admitting: Internal Medicine

## 2019-08-19 NOTE — Telephone Encounter (Signed)
New message:   Pt is calling and would like a call back from the assistant in reference to her mychart account. She has some other questions as well. Please advise.

## 2019-08-19 NOTE — Telephone Encounter (Signed)
F/u    The patient is asking for a call back today to discuss her next steps

## 2019-08-20 NOTE — Telephone Encounter (Signed)
Pt contacted and she stated that she has some one coming to visit her on Monday help her with some exercises and other things to help her get her strength back.   Pt will try later to get into her mychart.

## 2019-08-21 ENCOUNTER — Ambulatory Visit (HOSPITAL_COMMUNITY)
Admission: RE | Admit: 2019-08-21 | Discharge: 2019-08-21 | Disposition: A | Discharge status: Home / Self Care | Payer: Medicare Other | Source: Ambulatory Visit | Attending: Physician Assistant | Admitting: Physician Assistant

## 2019-08-21 ENCOUNTER — Other Ambulatory Visit: Payer: Self-pay

## 2019-08-21 ENCOUNTER — Encounter (HOSPITAL_COMMUNITY): Payer: Self-pay | Admitting: Physician Assistant

## 2019-08-21 VITALS — BP 150/90 | HR 90 | Ht 68.0 in | Wt 190.2 lb

## 2019-08-21 DIAGNOSIS — I4819 Other persistent atrial fibrillation: Secondary | ICD-10-CM | POA: Diagnosis not present

## 2019-08-21 DIAGNOSIS — Z7901 Long term (current) use of anticoagulants: Secondary | ICD-10-CM | POA: Diagnosis not present

## 2019-08-21 DIAGNOSIS — Z79899 Other long term (current) drug therapy: Secondary | ICD-10-CM | POA: Insufficient documentation

## 2019-08-21 DIAGNOSIS — E118 Type 2 diabetes mellitus with unspecified complications: Secondary | ICD-10-CM | POA: Insufficient documentation

## 2019-08-21 DIAGNOSIS — I10 Essential (primary) hypertension: Secondary | ICD-10-CM | POA: Insufficient documentation

## 2019-08-21 DIAGNOSIS — Z794 Long term (current) use of insulin: Secondary | ICD-10-CM | POA: Diagnosis not present

## 2019-08-21 DIAGNOSIS — D6869 Other thrombophilia: Secondary | ICD-10-CM | POA: Diagnosis not present

## 2019-08-21 DIAGNOSIS — T501X6A Underdosing of loop [high-ceiling] diuretics, initial encounter: Secondary | ICD-10-CM | POA: Diagnosis not present

## 2019-08-21 DIAGNOSIS — I2510 Atherosclerotic heart disease of native coronary artery without angina pectoris: Secondary | ICD-10-CM | POA: Insufficient documentation

## 2019-08-21 DIAGNOSIS — Z8249 Family history of ischemic heart disease and other diseases of the circulatory system: Secondary | ICD-10-CM | POA: Insufficient documentation

## 2019-08-21 DIAGNOSIS — R600 Localized edema: Secondary | ICD-10-CM | POA: Insufficient documentation

## 2019-08-21 DIAGNOSIS — Z91128 Patient's intentional underdosing of medication regimen for other reason: Secondary | ICD-10-CM | POA: Insufficient documentation

## 2019-08-21 LAB — BASIC METABOLIC PANEL
Anion gap: 12 (ref 5–15)
BUN: 24 mg/dL — ABNORMAL HIGH (ref 8–23)
CO2: 24 mmol/L (ref 22–32)
Calcium: 9 mg/dL (ref 8.9–10.3)
Chloride: 102 mmol/L (ref 98–111)
Creatinine, Ser: 0.93 mg/dL (ref 0.44–1.00)
GFR calc Af Amer: >60 mL/min (ref >60)
GFR calc non Af Amer: 60 mL/min — ABNORMAL LOW (ref >60)
Glucose, Bld: 259 mg/dL — ABNORMAL HIGH (ref 70–99)
Potassium: 4.1 mmol/L (ref 3.5–5.1)
Sodium: 138 mmol/L (ref 135–145)

## 2019-08-21 MED ORDER — APIXABAN 5 MG PO TABS
5.0000 mg | ORAL_TABLET | Freq: Two times a day (BID) | ORAL | 3 refills | Status: DC
Start: 2019-08-21 — End: 2019-09-09

## 2019-08-21 MED ORDER — TORSEMIDE 20 MG PO TABS: 20.0000 mg | ORAL_TABLET | Freq: Every day | ORAL | Status: DC

## 2019-08-21 NOTE — Progress Notes (Signed)
Primary Care Physician: [name], MD Primary Cardiologist: Dr Irish Lack Primary Electrophysiologist: none Referring Physician: [facility]   Nicole [name] is a 79 y.o. female with a history of paroxysmal atrial fibrillation, atrial flutter, HTN, DM, CAD, and lung cancer s/p radiation who presents for follow up in the [facility].  The patient was initially diagnosed with atrial fibrillation in 2018 after presenting with symptoms of a rapid, irregular pulse. She was initially on Eliquis but developed rectal bleeding and this was stopped. Patient was seen in ER on 07/29/18 with symptoms of dizziness and palpitations and was found to be in afib with RVR. She was restarted on Eliquis for a CHADS2VASC score of 6 and discharged in rate controlled afib.   On follow up today, patient reports that she has had more lower extremity edema over the last couple of weeks. She stopped the Lasix and never started torsemide due to urinary frequency. She does report that her urinary frequency improved after her PCP stopped [place]. Patient also stopped Eliquis again on her own. She denies any specific bleeding issues with the medication. She presented to the ED on 04/02/19 with cellulitis of her right had requiring irrigation and debridement. She was found to be in rate controlled afib incidentally at that time.  Today, she denies symptoms of palpitations, chest pain, orthopnea, PND, dizziness, presyncope, syncope, snoring, daytime somnolence, or neurologic sequela. The patient is tolerating medications without difficulties and is otherwise without complaint today.    Atrial Fibrillation Risk Factors:  she does not have symptoms or diagnosis of sleep apnea.   she has a BMI of Body mass index is 28.92 kg/m.[name] Filed Weights   08/21/19 1345  Weight: 86.3 kg    Family History  Problem Relation Age of Onset  . Heart attack Father   . Heart disease Father   . Arthritis Other    . Cancer Other        colon, lst degree relative  . Diabetes Other        st degree relative  . Hyperlipidemia Other   . Hypertension Other   . Colon cancer Neg Hx      Atrial Fibrillation Management history:  Previous antiarrhythmic drugs: none Previous cardioversions: none Previous ablations: none CHADS2VASC score: 6 Anticoagulation history: Eliquis   Past Medical History:  Diagnosis Date  . A-fib ([place])   . Adenocarcinoma of right lung, stage 3 ([place] [place]) 07/28/2016  . Atrial flutter ([place]) 10/05/2016  . Bronchitis    hx of  . Cancer [facility]) 2004   uterine/cervical  . Cancer-related pain 10/06/2016  . Coronary artery disease 02/21/2017   JAN 2019 Prox RCA lesion is 25% stenosed. Prox Cx lesion is 30% stenosed. Mid Cx lesion is 25% stenosed. Ost 3rd Mrg lesion is 30% stenosed. Prox LAD lesion is 25% stenosed. Mid LAD lesion is 25% stenosed. The left ventricular systolic function is normal. LV end diastolic pressure is normal. The left ventricular ejection fraction is 55-65% by visual estimate. There is no aortic valve stenosis.   N  . Diabetes mellitus    type 2  . Edema 07/12/2017  . Essential hypertension 01/20/2009  . Gallstones   . Headache   . History of radiation therapy 08/09/2016 to 09/19/2016   Right lung was treated to 60 Gy in 30 fractions at 2 Gy per fraction  . Hypertension   . Hypertriglyceridemia 08/22/2012  . Low back pain   . Lung mass  with lymphadenopathy  . Osteoarthritis   . Type II diabetes mellitus with manifestations ([place]) 01/20/2009   Estimated Creatinine Clearance: 49.8 mL/min (by C-G formula based on SCr of 1 mg/dL).   Past Surgical History:  Procedure Laterality Date  . ABDOMINAL HYSTERECTOMY  2004  . APPENDECTOMY  when 79 years old  . COLONOSCOPY  08-05-09   [name]  . I & D EXTREMITY Right 04/04/2019   Procedure: IRRIGATION AND DEBRIDEMENT RIGHT HAND;  Surgeon: Verner Mould, MD;  Location: [facility];  Service: Orthopedics;  Laterality:  Right;  . INCONTINENCE SURGERY    . KNEE ARTHROSCOPY Left   . LEFT HEART CATH AND CORONARY ANGIOGRAPHY N/A 02/24/2017   Procedure: LEFT HEART CATH AND CORONARY ANGIOGRAPHY;  Surgeon: Jettie Booze, MD;  Location: [facility];  Service: Cardiovascular;  Laterality: N/A;  . LEFT HEART CATHETERIZATION WITH CORONARY ANGIOGRAM N/A 07/19/2013   Procedure: LEFT HEART CATHETERIZATION WITH CORONARY ANGIOGRAM;  Surgeon: Sinclair Grooms, MD;  Location: [facility] CATH LAB;  Service: Cardiovascular;  Laterality: N/A;  . POLYPECTOMY  08-05-09   2 polyps  . TOTAL KNEE ARTHROPLASTY Left 03/28/2012   Procedure: LEFT TOTAL KNEE ARTHROPLASTY;  Surgeon: Tobi Bastos, MD;  Location: [facility];  Service: Orthopedics;  Laterality: Left;  . TUBAL LIGATION    . VIDEO BRONCHOSCOPY WITH ENDOBRONCHIAL ULTRASOUND N/A 07/11/2016   Procedure: VIDEO BRONCHOSCOPY WITH ENDOBRONCHIAL ULTRASOUND;  Surgeon: Marshell Garfinkel, MD;  Location: [facility];  Service: Pulmonary;  Laterality: N/A;    Current Outpatient Medications  Medication Sig Dispense Refill  . b complex vitamins tablet Take 1 tablet by mouth daily.    . calcium-vitamin D (OSCAL WITH D) 500-200 MG-UNIT tablet Take 2 tablets by mouth daily with breakfast.    . Collagenase POWD by Does not apply route daily.     . CONTOUR NEXT TEST test strip USE 1 STRIP TO CHECK GLUCOSE TWICE DAILY 200 each 2  . diltiazem (CARDIZEM CD) 360 MG 24 hr capsule TAKE 1 CAPSULE(360 MG) BY MOUTH DAILY 90 capsule 2  . glucose blood test strip Contour Next Test Strips  USE 1 STRIP TO CHECK GLUCOSE TWICE DAILY    . hydrocortisone (ANUSOL-HC) 2.5 % rectal cream Place 1 application rectally 3 (three) times daily. Use for 10 days. 30 g 1  . insulin glargine, 2 Unit Dial, (TOUJEO MAX SOLOSTAR) 300 UNIT/ML Solostar Pen Inject 30 Units into the skin daily. 9 mL 0  . Insulin Pen Needle (NOVOFINE) 32G X 6 MM MISC 1 Act by Does not apply route daily. 100 each 1  . Lancets (ACCU-CHEK SOFT TOUCH) lancets  Use to check blood sugars daily Dx E11.9 100 each 3  . metFORMIN (GLUCOPHAGE-XR) 500 MG 24 hr tablet Take 2 tablets (1,000 mg total) by mouth daily with breakfast. 180 tablet 0  . metoprolol tartrate (LOPRESSOR) 25 MG tablet Take 1 tablet every 8 hours AS NEEDED for rapid heart rate 30 tablet 1  . Multiple Vitamins-Minerals (MULTIPLE VITAMINS/WOMENS PO) Take by mouth daily.    [name] omega-3 acid ethyl esters (LOVAZA) 1 g capsule Take 2 capsules (2 g total) by mouth 2 (two) times daily. 360 capsule 1  . PREBIOTIC PRODUCT PO Take 1 capsule by mouth daily.    . rosuvastatin (CRESTOR) 5 MG tablet Take 1 tablet (5 mg total) by mouth daily. 90 tablet 1  . Turmeric 500 MG CAPS Take 1,000 mg by mouth daily.    [name] apixaban (ELIQUIS) 5  MG TABS tablet Take 1 tablet (5 mg total) by mouth 2 (two) times daily. 60 tablet 3  . torsemide (DEMADEX) 20 MG tablet Take 1 tablet (20 mg total) by mouth daily.     No current facility-administered medications for this encounter.    No Known Allergies  Social History   Socioeconomic History  . Marital status: Married    Spouse name: Not on file  . Number of children: 3  . Years of education: Not on file  . Highest education level: Not on file  Occupational History  . Occupation: retired    Fish farm manager: RETIRED  Tobacco Use  . Smoking status: Never Smoker  . Smokeless tobacco: Never Used  Vaping Use  . Vaping Use: Never used  Substance and Sexual Activity  . Alcohol use: No    Alcohol/week: 0.0 standard drinks  . Drug use: No  . Sexual activity: Not Currently  Other Topics Concern  . Not on file  Social History Narrative   Regular exercise- Yes   Social Determinants of Health   Financial Resource Strain: Low Risk   . Difficulty of Paying Living Expenses: Not hard at all  Food Insecurity: No Food Insecurity  . Worried About Charity fundraiser in the Last Year: Never true  . Ran Out of Food in the Last Year: Never true  Transportation Needs: No  Transportation Needs  . Lack of Transportation (Medical): No  . Lack of Transportation (Non-Medical): No  Physical Activity: Inactive  . Days of Exercise per Week: 0 days  . Minutes of Exercise per Session: 0 min  Stress: No Stress Concern Present  . Feeling of Stress : Only a little  Social Connections: Socially Integrated  . Frequency of Communication with Friends and Family: More than three times a week  . Frequency of Social Gatherings with Friends and Family: More than three times a week  . Attends Religious Services: 1 to 4 times per year  . Active Member of Clubs or Organizations: Yes  . Attends Archivist Meetings: 1 to 4 times per year  . Marital Status: Married  Human resources officer Violence: Not At Risk  . Fear of Current or Ex-Partner: No  . Emotionally Abused: No  . Physically Abused: No  . Sexually Abused: No     ROS- All systems are reviewed and negative except as per the HPI above.  Physical Exam: Vitals:   08/21/19 1345  BP: (!) 150/90  Pulse: 90  Weight: 86.3 kg  Height: 5' 8"  (1.727 m)    GEN- The patient is well appearing elderly female, alert and oriented x 3 today.   HEENT-head normocephalic, atraumatic, sclera clear, conjunctiva pink, hearing intact, trachea midline. Lungs- Clear to ausculation bilaterally, normal work of breathing Heart- irregular rate and rhythm, no murmurs, rubs or gallops  GI- soft, NT, ND, + BS Extremities- no clubbing, cyanosis. 2+ bilateral edema MS- no significant deformity or atrophy Skin- no rash or lesion Psych- euthymic mood, full affect Neuro- strength and sensation are intact   Wt Readings from Last 3 Encounters:  08/21/19 86.3 kg  07/31/19 79.4 kg  07/22/19 77.9 kg    EKG today demonstrates afib HR 90, QRS 86, QTc 428  Echo 10/05/16 demonstrated  - Left ventricle: Abnormal septal motion. There was mild concentric   hypertrophy. Systolic function was normal. The estimated ejection   fraction was in the  range of 55% to 60%. Doppler parameters are   consistent with abnormal left  ventricular relaxation (grade 1   diastolic dysfunction). - Aortic valve: Nodular calcification of the non coronary cusp.   There was mild regurgitation. - Mitral valve: Calcified annulus. Mildly thickened leaflets . - Atrial septum: A patent foramen ovale cannot be excluded. - Impressions: Abnormal GLS -12.4   Epic records are reviewed at length today  Assessment and Plan:  1. Persistent atrial fibrillation/atrial flutter ? If she has been in persistent afib since March. Stress importance of continuing anticoagulation in the absence of significant bleeding complications. Will again resume Eliquis 5 mg BID. Continue metoprolol 25 mg PRN q12 hrs for heart racing. Continue diltiazem 360 mg daily Would continue rate control for now. If her edema is felt to be 2/2 her afib and she is compliant with anticoagulation could consider rhythm control.   This patients CHA2DS2-VASc Score and unadjusted Ischemic Stroke Rate (% per year) is equal to 9.7 % stroke rate/year from a score of 6  Above score calculated as 1 point each if present [CHF, HTN, DM, Vascular=MI/PAD/Aortic Plaque, Age if 65-74, or Female] Above score calculated as 2 points each if present [Age > 75, or Stroke/TIA/TE]  2. CAD Nonobstructive by [facility] 02/24/17. No anginal symptoms.  3. HTN Stable, no changes today.  4. Lower extremity edema Patient admits she never started torsemide and has been increasing her water intake. Encourage her to limit her water intake to no more than 2L of fluid per day. 2 g sodium diet Resume torsemide 20 mg daily. Check bmet today.   Follow up with Dr Irish Lack in 1-2 weeks.    [facility] [address] [place], [place] [address] [phone] 08/21/2019 4:30 PM

## 2019-08-21 NOTE — Patient Instructions (Signed)
Restart Eliquis 5mg  twice a day  Restart Torsemide 20mg  once a day  Follow up with Dr. Irish Lack in 1-2 weeks.

## 2019-08-25 NOTE — Progress Notes (Signed)
Aug 2nd 2021 [facility] Palliative [facility] Consult Note Telephone: [phone]  Fax: [phone]  PATIENT NAME: Nicole [name] DOB: 09/28/1940 MRN: [id number]  [address]  [place] [address] [phone] (Mobile)  [phone] (Mobile)  PRIMARY CARE PROVIDER:   [name], MD  Dr. Irish [name] (Cardiology). Nicole So PA [facility]  REFERRING PROVIDER:  [name], MD [address] [place],  [place] [address]  RESPONSIBLE PARTY:  Nicole [name] (daughter) ([phone]   ASSESSMENT / RECOMMENDATIONS:  1. Advance Care Planning: A. Directives: We completed DNR, as well as MOST form. Details: DNR/DNI, Limited Scope of Medical Interventions, Yes to IVFs and Antibiotics, No to tube feedings.  B. Goals of Care: Do as much for herself as possible to maintain her independence. She has her home all set for accessibility. She exercises regularly (on her king-sized bed) and manages her medications well. She is knowledge regarding her own health status and situation. She has lung cancer, and currently monitoring with surveillance scans. Most recent chest CT stable disease but a few small pulmonary nodules that were new and nonspecific. Nxt scan in 3 months. Her spouse died last Dec 08, 2022 (under hospice care) and he passed peacefully. Patient has a very large and supportive family who are well involved in her care, though patient is clearly at the helm and they respect her decisions.   2. Cognitive / Functional status, Symptom Management: Patient is A & O x 3. Very pleasant and sweet.   She travels about her home either foot propelling while sitting on her transport wheelchair, pushing her transport wheelchair like a walker, or using a walker. She needs to walk up about 5 steps into the home. Get about in her small bathroom by holding onto the counter. Her gait is unsteady with wide stance.   -We discussed getting/building a  wheelchair ramp for the side entrance.  Patient recently saw Nicole So PA in the atrial fibrillation clinic. Patient had discontinued her diuretics d/t urinary frequency, but with subsequent accumulation of LE edema. Upon the advice of PA Fenton. She resumed her Torsemide 20mg  qd and over the last 5 days digressed 16 lbs. Her current weight is 174lbs (her height is 58 [name] she has a generous BMI). This may be a good dry weight for her as her sats are 97%, HR 80s. She is without dyspnea. She still has a bit of LE edema but its dependent in nature.  -patient plans to contact PA Fenton; update him with her weight loss and dose adjustment of the Torsemide as he feels appropriate.   Patient with chronic low back pain (DDD, spinal stenosis) managed with Tramadol   4. Follow up Palliative Care Visit: Patient will call us on a prn basis. [facility] services (home NP visits through patient's insurance) are involved.  I spent 60 minutes providing this consultation from 1pm to 2 pm. More than 50% of the time in this consultation was spent coordinating communication.   HISTORY OF PRESENT ILLNESS:  [name] is a 79 y.o.  female with a history of paroxysmal atrial fibrillation (dx 2019; Eliquis), atrial flutter, HTN, DM 2, CAD (non obstructive; nl EF cath Jan 2019), and R lung cancer stage 3 s/p radiation/chemo. Stable disease; surveillance scans q 3 months.  Osteoarthritis, MRSA skin infection. -07/29/2019 ER visit symptomatic afib with RVR. Restarted on Eliquis. Rate controlled. -04/02/2019 ER visit R hand cellulitis requiring irrigation and debridement. Was in afib rate controlled.  Palliative Care was asked to help address goals of care.   CODE STATUS: DNR  PPS: 60%  HOSPICE ELIGIBILITY/DIAGNOSIS: TBD  PAST MEDICAL HISTORY:  Past Medical History:  Diagnosis Date   A-fib [facility])    Adenocarcinoma of right lung, stage 3 ([place]) 07/28/2016   Atrial flutter ([place]) 10/05/2016   Bronchitis    hx of    Cancer ([place]) 2004   uterine/cervical   Cancer-related pain 10/06/2016   Coronary artery disease 02/21/2017   JAN 2019 Prox RCA lesion is 25% stenosed. Prox Cx lesion is 30% stenosed. Mid Cx lesion is 25% stenosed. Ost 3rd Mrg lesion is 30% stenosed. Prox LAD lesion is 25% stenosed. Mid LAD lesion is 25% stenosed. The left ventricular systolic function is normal. LV end diastolic pressure is normal. The left ventricular ejection fraction is 55-65% by visual estimate. There is no aortic valve stenosis.   N   Diabetes mellitus    type 2   Edema 07/12/2017   Essential hypertension 01/20/2009   Gallstones    Headache    History of radiation therapy 08/09/2016 to 09/19/2016   Right lung was treated to 60 Gy in 30 fractions at 2 Gy per fraction   Hypertension    Hypertriglyceridemia 08/22/2012   Low back pain    Lung mass    with lymphadenopathy   Osteoarthritis    Type II diabetes mellitus with manifestations ([place]) 01/20/2009   Estimated Creatinine Clearance: 49.8 mL/min (by C-G formula based on SCr of 1 mg/dL).    SOCIAL HX:  Social History   Tobacco Use   Smoking status: Never Smoker   Smokeless tobacco: Never Used  Substance Use Topics   Alcohol use: No    Alcohol/week: 0.0 standard drinks    ALLERGIES: No Known Allergies   PERTINENT MEDICATIONS:  Outpatient Encounter Medications as of 08/26/2019  Medication Sig   apixaban (ELIQUIS) 5 MG TABS tablet Take 1 tablet (5 mg total) by mouth 2 (two) times daily.   b complex vitamins tablet Take 1 tablet by mouth daily.   calcium-vitamin D (OSCAL WITH D) 500-200 MG-UNIT tablet Take 2 tablets by mouth daily with breakfast.   Collagenase POWD by Does not apply route daily.    CONTOUR NEXT TEST test strip USE 1 STRIP TO CHECK GLUCOSE TWICE DAILY   diltiazem (CARDIZEM CD) 360 MG 24 hr capsule TAKE 1 CAPSULE(360 MG) BY MOUTH DAILY   glucose blood test strip Contour Next Test Strips  USE 1 STRIP TO CHECK GLUCOSE TWICE  DAILY   hydrocortisone (ANUSOL-HC) 2.5 % rectal cream Place 1 application rectally 3 (three) times daily. Use for 10 days.   insulin glargine, 2 Unit Dial, (TOUJEO MAX SOLOSTAR) 300 UNIT/ML Solostar Pen Inject 30 Units into the skin daily.   Insulin Pen Needle (NOVOFINE) 32G X 6 MM MISC 1 Act by Does not apply route daily.   Lancets (ACCU-CHEK SOFT TOUCH) lancets Use to check blood sugars daily Dx E11.9   metFORMIN (GLUCOPHAGE-XR) 500 MG 24 hr tablet Take 2 tablets (1,000 mg total) by mouth daily with breakfast.   metoprolol tartrate (LOPRESSOR) 25 MG tablet Take 1 tablet every 8 hours AS NEEDED for rapid heart rate   Multiple Vitamins-Minerals (MULTIPLE VITAMINS/WOMENS PO) Take by mouth daily.   omega-3 acid ethyl esters (LOVAZA) 1 g capsule Take 2 capsules (2 g total) by mouth 2 (two) times daily.   PREBIOTIC PRODUCT PO Take 1 capsule by mouth daily.   rosuvastatin (CRESTOR) 5 MG tablet  Take 1 tablet (5 mg total) by mouth daily.   torsemide (DEMADEX) 20 MG tablet Take 1 tablet (20 mg total) by mouth daily.   Turmeric 500 MG CAPS Take 1,000 mg by mouth daily.   No facility-administered encounter medications on file as of 08/26/2019.     Julianne Handler, NP  [place]

## 2019-08-26 ENCOUNTER — Other Ambulatory Visit: Payer: Medicare Other | Admitting: Internal Medicine

## 2019-08-26 ENCOUNTER — Other Ambulatory Visit: Payer: Self-pay

## 2019-08-26 DIAGNOSIS — Z7189 Other specified counseling: Secondary | ICD-10-CM | POA: Diagnosis not present

## 2019-08-26 DIAGNOSIS — Z515 Encounter for palliative care: Secondary | ICD-10-CM

## 2019-08-27 ENCOUNTER — Telehealth: Payer: Self-pay | Admitting: Internal Medicine

## 2019-08-27 ENCOUNTER — Encounter: Payer: Self-pay | Admitting: Internal Medicine

## 2019-08-27 DIAGNOSIS — R531 Weakness: Secondary | ICD-10-CM

## 2019-08-27 DIAGNOSIS — E118 Type 2 diabetes mellitus with unspecified complications: Secondary | ICD-10-CM

## 2019-08-27 DIAGNOSIS — I480 Paroxysmal atrial fibrillation: Secondary | ICD-10-CM

## 2019-08-27 DIAGNOSIS — M48061 Spinal stenosis, lumbar region without neurogenic claudication: Secondary | ICD-10-CM

## 2019-08-27 NOTE — Telephone Encounter (Signed)
   Patient's daughter calling to request order be written for standard wheelchair and motorized wheelchair.  Please call daughter [name]

## 2019-08-28 ENCOUNTER — Telehealth (HOSPITAL_COMMUNITY): Payer: Self-pay | Admitting: *Deleted

## 2019-08-28 MED ORDER — TORSEMIDE 20 MG PO TABS: 20.0000 mg | ORAL_TABLET | ORAL | Status: AC

## 2019-08-28 NOTE — Telephone Encounter (Signed)
Patient called in stating she going to the bathroom every hour on the toresmide and its causing her to be very weak. She also has complaint of multiple bruises on legs since restarting eliquis. Reassured bruising can be expected on eliquis and discussed urinary frequency with Adline Peals will decrease toresmide to 20mg  every other day since pt has lost 16lbs since resuming. Pt very delighted to decrease toresmide to every other day.

## 2019-08-28 NOTE — Telephone Encounter (Signed)
Rx has been printed.   LVM for [name] informing that rx's are ready for pick up.   If dtr calls back about picking up the prescriptions, she will need to tell where to send it to and which chair is requested. I can not send for both.

## 2019-08-29 NOTE — Telephone Encounter (Signed)
[name] called back.   She will call insurance company to confirm the type of mobility dme they will cover.   I will:   Check on PT Eval prior to order RX for bedside commode RX for gait belt

## 2019-09-02 ENCOUNTER — Telehealth (HOSPITAL_COMMUNITY): Payer: Self-pay

## 2019-09-02 ENCOUNTER — Telehealth: Payer: Self-pay

## 2019-09-02 DIAGNOSIS — K625 Hemorrhage of anus and rectum: Secondary | ICD-10-CM

## 2019-09-02 NOTE — Telephone Encounter (Signed)
Referral entered per pt request.

## 2019-09-02 NOTE — Telephone Encounter (Signed)
Patient called in and states she has been having rectal bleeding. She had the bleeding for 2 days which started Friday. The bleeding stopped after Saturday. She stopped her Eliquis yesterday. Per Tilda Franco he recommended her to contact her GI doctor regarding the bleeding and she should hold the Eliquis medication until she hears back from her GI doctor. Consulted with patient and she verbalized understanding.

## 2019-09-02 NOTE — Telephone Encounter (Signed)
New message    The patient is asking for a referral to [facility] reason is rectal bleeding

## 2019-09-03 ENCOUNTER — Telehealth: Payer: Self-pay

## 2019-09-03 ENCOUNTER — Telehealth: Payer: Self-pay | Admitting: Gastroenterology

## 2019-09-03 NOTE — Telephone Encounter (Signed)
Received a message from PCP office that patient has been up throughout all night and PCP office is concerned that patient may have a GI bleed and may need to be hospitalized.Phone call placed to patient to check in. Patient stated that she had some rectal bleeding on Friday and Saturday (8/6-8/7) that she feels was due to Eliquis and hemorrhoids.Patient shared that she has stopped the Eliquis and has had no further bleeding. Patient states she has bowel movements daily but consistency and color are wnl. Patient shared that she has been up throughout the night urinating and when she stands up to go to the bathroom, the urine will pour out of her. Patient denies any burning or fever. She feels that this is due to her current medication for edema. Patient reports BP 117/80, pulse is 80-82. She states she does not feel she is in any distress. She is hoping to rest today. Will reach out to PCP office to update. Palliative care visit scheduled for 09/05/19 @ 10:30am.

## 2019-09-03 NOTE — Telephone Encounter (Signed)
Pt is very upset that she is unable to move around.  Pt stated that [facility] at [phone] came out and has not heard anything.  Stacy stated that Stanton Kidney, NP came out and saw pt. Message is being relayed to [facility], NP and to [employer] the Art therapist. They are going to reach to the patient and to [place] with additional information/advise

## 2019-09-03 NOTE — Telephone Encounter (Signed)
We received a referral for rectal bleeding spoke with patient she said she has been having frequent diarrhea for a few days now along with the bleeding. States she thought it had to do with the Eliquis medication so has stopped it but does not know if she should continue also stated she is very fatigued and needs direction please advise prefers to speak with a nurse rather then come in for now until her energy level comes back up

## 2019-09-03 NOTE — Telephone Encounter (Signed)
The pt wanted to let our office know that she is not able to come out for any appointments.  She has spoken with her PCP and her insurance regarding followup transportation.  She has a call to her PCP regarding her Eliquis and will call back to our office when she gets transportation in order.

## 2019-09-04 NOTE — Telephone Encounter (Signed)
Per patient - she gotten all of the DME she needed. Pt paid out of pocket for the equipment.

## 2019-09-05 ENCOUNTER — Other Ambulatory Visit: Payer: Medicare Other | Admitting: Internal Medicine

## 2019-09-05 ENCOUNTER — Other Ambulatory Visit: Payer: Self-pay

## 2019-09-05 DIAGNOSIS — Z7189 Other specified counseling: Secondary | ICD-10-CM | POA: Diagnosis not present

## 2019-09-05 DIAGNOSIS — Z515 Encounter for palliative care: Secondary | ICD-10-CM

## 2019-09-05 NOTE — Telephone Encounter (Signed)
Tried to call pt. Line rang and then the call dropped.

## 2019-09-07 ENCOUNTER — Other Ambulatory Visit (HOSPITAL_COMMUNITY): Payer: Self-pay | Admitting: Physician Assistant

## 2019-09-07 ENCOUNTER — Encounter: Payer: Self-pay | Admitting: Internal Medicine

## 2019-09-07 ENCOUNTER — Other Ambulatory Visit: Payer: Self-pay | Admitting: Internal Medicine

## 2019-09-07 DIAGNOSIS — E118 Type 2 diabetes mellitus with unspecified complications: Secondary | ICD-10-CM

## 2019-09-07 DIAGNOSIS — I10 Essential (primary) hypertension: Secondary | ICD-10-CM

## 2019-09-07 NOTE — Progress Notes (Addendum)
Aug 12th 2021 [facility] Consult Note Telephone: [phone]  Fax: [phone]   PATIENT NAME: Nicole [name] DOB: [birth year] MRN: [id number]  [place] [address] [phone] (Mobile)  [phone] (Mobile)   PRIMARY CARE PROVIDER:   [name], MD   Dr. Irish Lack (Cardiology). [facility]   REFERRING PROVIDER:  [name], MD [address] [place],  [place] [address]   RESPONSIBLE PARTY:  Clarissa Laird (daughter) ([phone]    ASSESSMENT / RECOMMENDATIONS:  1. Advance Care Planning: A. Directives: DNR, and MOST form in the home. Details of MOST: DNR/DNI, Limited Scope of Medical Interventions, Yes to IVFs and Antibiotics, No to tube feedings.  B. Goals of Care (Reviewed and updated from previous note): Do as much for herself as possible to maintain her independence. She has her home all set for accessibility. She exercises regularly (on her king-sized bed) and manages her medications well. She is knowledge regarding her own health status and situation. She has lung cancer, and currently monitoring with surveillance scans. Most recent chest CT stable disease but a few small pulmonary nodules that were new and nonspecific. Nxt scan in 3 months. Her spouse died last 12/05/2022 (under hospice care) and he passed peacefully. Patient has a very large and supportive family who are well involved in her care, though patient is clearly at the helm and they respect her decisions.  Current Goal: Management of low back pain so that she can improve her ability to get up and about   2. Cognitive / Functional status, Symptom Management (reviewed and updated from pre: Patient is A & O x 3. Very pleasant and sweet.   She travels about her home either foot propelling while sitting on her transport wheelchair, pushing her transport wheelchair like a walker, or using a walker. She needs to  walk up about 5 steps into the home. Get about in her small bathroom by holding onto the counter. Her gait is unsteady with wide stance.   -she's actively investigating building a wheelchair ramp; waiting for a call back from carpenter.  -discussed getting a side rail for her electric bed  Continues low back pain tolerable when sitting up in wheelchair or after she has positioned herself in bed, but it grabs her with sudden, severe pain so that she cries out, when she transfers. She uses [place] herbal patches and turmeric, prn Ibuprofen about bid. Avoids Tramadol. She has had past orthopedic evaluation (Dr. Smith/Dr. Scarlette Calico) in the past (DDD, spinal stenosis). -start Tylenol 650mg  q4hr (6a,10a,2p,6p,10p) on a regular schedule for a few days, see if helpful. -when raising legs up, try just raising/keeping one leg up for a time, rather than both at the same time, to ease strain on her back.    Continues bilateral LE swelling, but improved. Improved with LE elevation.  She is on Torsemide 20mg  qod but is up q2h for most of the day, so hates to take it. She watches the salt in her diet, and tried not to drink too much water ("I have to drink to take all my pills"). Followed by Malka So PA in atrial fibrillation clinic. Recent weight of 172 lbs, which seems her dry weight (compensate by exam today). No dyspnea. Some bilateral soft insp crackles but otherwise clear. Maintaining Sats in the mid to high 90's. HR 80's. -I changed the Torsemide dosage schedule to 10 mg qd (form 20mg   qod) to improve compliance.   Streaks of bright red blood PR mixed with stool 6 days ago (8/6-08/31/19). Stools otherwise normal in color and consistency. She discontinued her Eliquis 4 days earlier and has no recurrence of BRBPR. She was counseled by her PCP to f/u for outpatient GI consult but it's difficult for her to transfer out of the home for that appointment d/t back pain. We discussed the risks of stroke setting of a  fib off the Eliquis. She understands but will take her chances. She feels that the Eliquis just doesn't sit well with her.   3. Follow up Palliative Care Visit: Scheduler to contact her for f/u PC visit. [facility] (home NP visits through patient's insurance) are involved.   I spent 60 minutes providing this consultation from 10:30am to 11:30am. More than 50% of the time in this consultation was spent coordinating communication.    HISTORY OF PRESENT ILLNESS:  Nicole [name] is a 79 y.o.  female with a history of paroxysmal atrial fibrillation (dx 2019; Eliquis), atrial flutter, HTN, DM 2, CAD (non obstructive; nl EF cath Jan 2019), and R lung cancer stage 3 s/p radiation/chemo. Stable disease; surveillance scans q 3 months.  Osteoarthritis, MRSA skin infection. -07/29/2019 ER visit symptomatic afib with RVR. Restarted on Eliquis. Rate controlled. -04/02/2019 ER visit R hand cellulitis requiring irrigation and debridement. Was in afib rate controlled.    This is a f/u Palliative Care visit from Aug 12th, 2021.    CODE STATUS: DNR   PPS: 60%   HOSPICE ELIGIBILITY/DIAGNOSIS: TBD  PAST MEDICAL HISTORY:  Past Medical History:  Diagnosis Date  . A-fib ([place])   . Adenocarcinoma of right lung, stage 3 ([place]) 07/28/2016  . Atrial flutter ([place]) 10/05/2016  . Bronchitis    hx of  . 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 Cancer-related pain 10/06/2016  . Coronary artery disease 02/21/2017   JAN 2019 Prox RCA lesion is 25% stenosed. Prox Cx lesion is 30% stenosed. Mid Cx lesion is 25% stenosed. Ost 3rd Mrg lesion is 30% stenosed. Prox LAD lesion is 25% stenosed. Mid LAD lesion is 25% stenosed. The left ventricular systolic function is normal. LV end diastolic pressure is normal. The left ventricular ejection fraction is 55-65% by visual estimate. There is no aortic valve stenosis.   N  . Diabetes mellitus    type 2  . Edema 07/12/2017  . Essential hypertension 01/20/2009  . Gallstones   .  Headache   . History of radiation therapy 08/09/2016 to 09/19/2016   Right lung was treated to 60 Gy in 30 fractions at 2 Gy per fraction  . Hypertension   . Hypertriglyceridemia 08/22/2012  . Low back pain   . Lung mass    with lymphadenopathy  . Osteoarthritis   . Type II diabetes mellitus with manifestations ([place]) 01/20/2009   Estimated Creatinine Clearance: 49.8 mL/min (by C-G formula based on SCr of 1 mg/dL).    SOCIAL HX:  Social History   Tobacco Use  . Smoking status: Never Smoker  . Smokeless tobacco: Never Used  Substance Use Topics  . Alcohol use: No    Alcohol/week: 0.0 standard drinks    ALLERGIES: No Known Allergies   PERTINENT MEDICATIONS:  Outpatient Encounter Medications as of 09/05/2019  Medication Sig  . apixaban (ELIQUIS) 5 MG TABS tablet Take 1 tablet (5 mg total) by mouth 2 (two) times daily.  [name] b complex vitamins tablet Take 1 tablet by mouth daily.  [name]  calcium-vitamin D (OSCAL WITH D) 500-200 MG-UNIT tablet Take 2 tablets by mouth daily with breakfast.  . Collagenase POWD by Does not apply route daily.   . CONTOUR NEXT TEST test strip USE 1 STRIP TO CHECK GLUCOSE TWICE DAILY  . diltiazem (CARDIZEM CD) 360 MG 24 hr capsule TAKE 1 CAPSULE(360 MG) BY MOUTH DAILY  . glucose blood test strip Contour Next Test Strips  USE 1 STRIP TO CHECK GLUCOSE TWICE DAILY  . hydrocortisone (ANUSOL-HC) 2.5 % rectal cream Place 1 application rectally 3 (three) times daily. Use for 10 days.  . insulin glargine, 2 Unit Dial, (TOUJEO MAX SOLOSTAR) 300 UNIT/ML Solostar Pen Inject 30 Units into the skin daily.  . Insulin Pen Needle (NOVOFINE) 32G X 6 MM MISC 1 Act by Does not apply route daily.  . Lancets (ACCU-CHEK SOFT TOUCH) lancets Use to check blood sugars daily Dx E11.9  . metFORMIN (GLUCOPHAGE-XR) 500 MG 24 hr tablet Take 2 tablets (1,000 mg total) by mouth daily with breakfast.  . metoprolol tartrate (LOPRESSOR) 25 MG tablet Take 1 tablet every 8 hours AS NEEDED for rapid  heart rate  . Multiple Vitamins-Minerals (MULTIPLE VITAMINS/WOMENS PO) Take by mouth daily.  [name] omega-3 acid ethyl esters (LOVAZA) 1 g capsule Take 2 capsules (2 g total) by mouth 2 (two) times daily.  [name] PREBIOTIC PRODUCT PO Take 1 capsule by mouth daily.  . rosuvastatin (CRESTOR) 5 MG tablet Take 1 tablet (5 mg total) by mouth daily.  [name] torsemide (DEMADEX) 20 MG tablet Take 1 tablet (20 mg total) by mouth every other day.  . Turmeric 500 MG CAPS Take 1,000 mg by mouth daily.   No facility-administered encounter medications on file as of 09/05/2019.   GENREAL EXAM:   122/84, Sats RA 97%, HR 69 Pleasantly conversant, motoring about at good speed in her transport wheelchair. Busy cooking a dinner in anticipation of her daughter visiting this evening. Cardiac: irregular RR Chest: Bilateral insp crackles base, otherwise clear.  Ext: bilateral LE swelling to high calf; skin changes of venous stasis.   Julianne Handler, NP  [place]

## 2019-09-09 ENCOUNTER — Telehealth: Payer: Self-pay | Admitting: Internal Medicine

## 2019-09-09 NOTE — Telephone Encounter (Signed)
Pt's age 79, wt 86.3 kg, SCr 0.93, CrCl 70.11, last ov w/ JV 04/02/19.

## 2019-09-09 NOTE — Telephone Encounter (Signed)
Patient is requesting to send her daughter to pick up samples of insulins for patient, But she is unclear what it is called.  She stated you should be able to look at the chart and see what it is. She only has some left for tonight.

## 2019-09-09 NOTE — Telephone Encounter (Signed)
Called patient x3 and someone was pick up the phone and hanging up.   Please inform patient samples are ready to be picked up when she returns call.

## 2019-09-11 ENCOUNTER — Encounter: Payer: Self-pay | Admitting: Family Medicine

## 2019-09-11 ENCOUNTER — Other Ambulatory Visit: Payer: Self-pay

## 2019-09-11 ENCOUNTER — Ambulatory Visit: Payer: Medicare Other | Admitting: Family Medicine

## 2019-09-11 VITALS — BP 120/60 | HR 88 | Ht 68.0 in

## 2019-09-11 DIAGNOSIS — S39012A Strain of muscle, fascia and tendon of lower back, initial encounter: Secondary | ICD-10-CM | POA: Diagnosis not present

## 2019-09-11 MED ORDER — TOUJEO MAX SOLOSTAR 300 UNIT/ML ~~LOC~~ SOPN: 30.0000 [IU] | PEN_INJECTOR | Freq: Every day | SUBCUTANEOUS | 0 refills | Status: DC

## 2019-09-11 NOTE — Patient Instructions (Signed)
Thank you for coming in today. Plan for home health PT.  Use heating pad and TENS unit.  Try over the counter voltaren gel.   Recheck as needed.   TENS UNIT: This is helpful for muscle pain and spasm.   Search and Purchase a TENS 7000 2nd edition at  www.tenspros.com or www.[employer].com It should be less than $30.     TENS unit instructions: Do not shower or bathe with the unit on Turn the unit off before removing electrodes or batteries If the electrodes lose stickiness add a drop of water to the electrodes after they are disconnected from the unit and place on plastic sheet. If you continued to have difficulty, call the TENS unit company to purchase more electrodes. Do not apply lotion on the skin area prior to use. Make sure the skin is clean and dry as this will help prolong the life of the electrodes. After use, always check skin for unusual red areas, rash or other skin difficulties. If there are any skin problems, does not apply electrodes to the same area. Never remove the electrodes from the unit by pulling the wires. Do not use the TENS unit or electrodes other than as directed. Do not change electrode placement without consultating your therapist or physician. Keep 2 fingers with between each electrode. Wear time ratio is 2:1, on to off times.    For example on for 30 minutes off for 15 minutes and then on for 30 minutes off for 15 minutes     Lumbosacral Strain Lumbosacral strain is an injury that causes pain in the lower back (lumbosacral spine). This injury usually happens from overstretching the muscles or ligaments along your spine. Ligaments are cord-like tissues that connect bones to other bones. A strain can affect one or more muscles or ligaments. What are the causes? This condition may be caused by: A hard, direct hit to the back. Overstretching the lower back muscles. This may result from: A fall. Lifting something heavy. Repetitive movements such as  bending or crouching. What increases the risk? The following factors may make you more likely to develop this condition: Participating in sports or activities that involve: A sudden twist of the back. Pushing or pulling motions. Being overweight or obese. Having poor strength and flexibility, especially tight hamstrings or weak muscles in the back or abdomen. Having too much of a curve in the lower back. Having a pelvis that is tilted forward. What are the signs or symptoms? The main symptom of this condition is pain in the lower back, at the site of the strain. Pain may also be felt down one or both legs. How is this diagnosed? This condition is diagnosed based on your symptoms, your medical history, and a physical exam. During the physical exam, your health care provider may push on certain areas of your back to find the source of your pain. You may be asked to bend forward, backward, and side to side to check your pain and range of motion. You may also have imaging tests, such as X-rays and an MRI. How is this treated? This condition may be treated by: Applying heat and cold on the affected area. Taking medicines to help relieve pain and relax your muscles. Taking NSAIDs, such as ibuprofen, to help reduce swelling and discomfort. Doing stretching and strengthening exercises for your lower back. Symptoms usually improve within several weeks of treatment. However, recovery time varies. When your symptoms improve, gradually return to your normal routine as soon  as possible to reduce pain, avoid stiffness, and keep muscle strength. Follow these instructions at home: Medicines Take over-the-counter and prescription medicines only as told by your health care provider. Ask your health care provider if the medicine prescribed to you: Requires you to avoid driving or using heavy machinery. Can cause constipation. You may need to take these actions to prevent or treat constipation: Drink enough  fluid to keep your urine pale yellow. Take over-the-counter or prescription medicines. Eat foods that are high in fiber, such as beans, whole grains, and fresh fruits and vegetables. Limit foods that are high in fat and processed sugars, such as fried or sweet foods. Managing pain, stiffness, and swelling     If directed, put ice on the injured area. To do this: Put ice in a plastic bag. Place a towel between your skin and the bag. Leave the ice on for 20 minutes, 2-3 times a day. If directed, apply heat on the affected area as often as told by your health care provider. Use the heat source that your health care provider recommends, such as a moist heat pack or a heating pad. Place a towel between your skin and the heat source. Leave the heat on for 20-30 minutes. Remove the heat if your skin turns bright red. This is especially important if you are unable to feel pain, heat, or cold. You may have a greater risk of getting burned. Activity Rest as told by your health care provider. Do not stay in bed. Staying in bed for more than 1-2 days can delay your recovery. Return to your normal activities as told by your health care provider. Ask your health care provider what activities are safe for you. Avoid activities that take a lot of energy for as long as told by your health care provider. Do exercises as told by your health care provider. This includes stretching and strengthening exercises. General instructions Sit up and stand up straight. Avoid leaning forward when you sit, or hunching over when you stand. Do not use any products that contain nicotine or tobacco, such as cigarettes, e-cigarettes, and chewing tobacco. If you need help quitting, ask your health care provider. Keep all follow-up visits as told by your health care provider. This is important. How is this prevented?  Use correct form when playing sports and lifting heavy objects. Use good posture when sitting and  standing. Maintain a healthy weight. Sleep on a mattress with medium firmness to support your back. Do at least 150 minutes of moderate-intensity exercise each week, such as brisk walking or water aerobics. Try a form of exercise that takes stress off your back, such as swimming or stationary cycling. Maintain physical fitness, including: Strength. Flexibility. Contact a health care provider if: Your back pain does not improve after several weeks of treatment. Your symptoms get worse. Get help right away if: Your back pain is severe. You cannot stand or walk. You have difficulty controlling when you urinate or when you have a bowel movement. You feel nauseous or you vomit. Your feet or legs get very cold, turn pale, or look blue. You have numbness, tingling, weakness, or problems using your arms or legs. You develop any of the following: Shortness of breath. Dizziness. Pain in your legs. Weakness in your buttocks or legs. Summary Lumbosacral strain is an injury that causes pain in the lower back (lumbosacral spine). This injury usually happens from overstretching the muscles or ligaments along your spine. This condition may be caused by  a direct hit to the lower back or by overstretching the lower back muscles. Symptoms usually improve within several weeks of treatment. This information is not intended to replace advice given to you by your health care provider. Make sure you discuss any questions you have with your health care provider. Document Revised: 06/05/2018 Document Reviewed: 06/05/2018 Elsevier Patient Education  [place].

## 2019-09-11 NOTE — Progress Notes (Signed)
I, Kandace Blitz, LAT, ATC, am serving as scribe for Dr. Lynne Leader.  Nicole [name] is a 79 y.o. female who presents to [facility] today for f/u of low back pain and sciatica.  She has known lumbar spinal stenosis and has had a prior lumbar epidural (R L4-5) on 01/31/18.  Since her last visit w/ Dr. Tamala Julian, pt reports she lays in her bed all day. Daughter has to pick her up out the bed. Pain started in March. States she has had sciatica before. Last epidural helped a lot. Laying down for long periods of time she feels pain in her lower back. Can't stand for long period of time. States she resting and she has been in pain still. Pain is always on the left side. Taking metoprolol for pain every 6 hours. Diabetic neuropathy. Bruises all over her body and the patient states this is a side effect of her medication. States she is very active and tries to exercise but the back is slowing her down. Daughter is concerned about how to take care of the pain. Wants to know what type of bed is best for her because the bed she is currently using is too high. Would also like home health care. Patient hasn't left the house for over a week.   Diagnostic testing: L-spine XR- 08/01/19; L hip XR- 05/08/19   Pertinent review of systems: No fevers or chills  Relevant historical information: Significant medical comorbidities.  Lung cancer, atrial fibrillation, heart failure, diabetes   Exam:  BP 120/60 (BP Location: Left Arm, Patient Position: Sitting)   Pulse 88   Ht 5\' 8"  (1.727 m)   SpO2 98%   BMI 28.92 kg/m  General: Well Developed, well nourished, and in no acute distress.   MSK: L-spine nontender midline.  Tender palpation left lumbar paraspinal musculature. Decreased lumbar motion. Strength reflexes and sensation are equal bilateral lower extremities.    Lab and Radiology Results EXAM: LUMBAR SPINE - COMPLETE 4+ VIEW  COMPARISON:  April 26, 2017.  FINDINGS: Minimal grade 1 retrolisthesis of L2-3 is noted. Minimal grade 1 anterolisthesis of L4-5 is noted. Moderate degenerative disc disease is noted at L3-4, L4-5 and L5-S1 with anterior osteophyte formation.  IMPRESSION: Moderate multilevel degenerative disc disease. No acute abnormality seen in the lumbar spine.  Aortic Atherosclerosis (ICD10-I70.0).   Electronically Signed   By: Marijo Conception M.D.   On: 08/01/2019 16:25 I, Lynne Leader, personally (independently) visualized and performed the interpretation of the images attached in this note.     Assessment and Plan: 79 y.o. female with low back pain due to lumbosacral strain.  Patient does have degenerative changes seen on recent x-ray however I believe her majority of her pain today is due to muscle spasm and dysfunction.  Plan for physical therapy trial.  Will use home health physical therapy since she does not drive and is in poor overall health.  Improving recheck back as needed.  Consider trigger point injection in clinic or even MRI for her facet injection.  However her diabetes and anticoagulation mean that this is going to be much more challenging to accomplish.   PDMP not reviewed this encounter. Orders Placed This Encounter  Procedures  . Ambulatory referral to Home Health    Referral Priority:   Routine    Referral Type:   Home Health Care    Referral Reason:   Specialty Services Required    Requested Specialty:  Home Health Services    Number of Visits Requested:   1   No orders of the defined types were placed in this encounter.    Discussed warning signs or symptoms. Please see discharge instructions. Patient expresses understanding.   The above documentation has been reviewed and is accurate and complete Lynne Leader, M.D.

## 2019-09-11 NOTE — Telephone Encounter (Signed)
LVM for [name] to call back.   Referral for Neuro Rehab has been entered.   Pt had stated in a previous message that she had paid for the DME.    [name] called back and stated that the wheelchair has not been purchased yet. Informed that the referral and order for Neurorehab and wheelchair has been completed.

## 2019-09-11 NOTE — Addendum Note (Signed)
Addended by: Karle Barr on: 09/11/2019 04:53 PM   Modules accepted: Orders

## 2019-09-11 NOTE — Addendum Note (Signed)
Addended by: Aviva Signs M on: 09/11/2019 12:24 PM   Modules accepted: Orders

## 2019-09-11 NOTE — Telephone Encounter (Signed)
Pt's dtr [name] called and was inquiring about the PT referral for motorized wheelchair evaluation.  Can you please put a referral in for Neuro PT?  Thanks Cecille Rubin

## 2019-09-11 NOTE — Telephone Encounter (Signed)
Per [name] request, [facility] referral has been entered. Erx for Toujeo Max has been sent to pof.

## 2019-09-11 NOTE — Telephone Encounter (Signed)
Pt picked up samples 09/11/2019.

## 2019-09-12 NOTE — Telephone Encounter (Signed)
Dtr stated that someone has been coming out to the house.

## 2019-09-13 ENCOUNTER — Telehealth: Payer: Self-pay | Admitting: Internal Medicine

## 2019-09-13 NOTE — Telephone Encounter (Signed)
Nicole [name] with [facility] came by the office today and dropped off release of information form that the pt signed and he is requesting the last 2 office visit notes. They can be faxed to [phone]. A copy of the release of information form has been placed in the [facility] box.

## 2019-09-19 NOTE — Progress Notes (Signed)
Cardiology Office Note   Date:  09/20/2019   ID:  Nicole [name], Nicole [name] 23-Jan-1941, MRN [id number]  PCP:  Nicole Lima, MD    No chief complaint on file.  AFib   Wt Readings from Last 3 Encounters:  09/20/19 181 lb 12.8 oz (82.5 kg)  08/21/19 190 lb 3.2 oz (86.3 kg)  07/31/19 175 lb (79.4 kg)       History of Present Illness: Nicole [name] is a 79 y.o. female   has history of lung cancer status post radiation and was felt she might have radiation pneumonitis and was treated with steroids which seemed to help her cough. Cardiac catheterization 02/24/2017 showed nonobstructive CAD with 25% proximal RCA, 30% proximal circumflex, 25% mid circumflex, 30% ostial third marginal, 25% proximal and mid LAD, normal LVEF 55 to 65%, no aortic valve stenosis. Aggressive medical therapy recommended   I saw the patient 06/06/2017 at which time she was having rectal bleeding on Eliquis( for atrial flutter)felt secondary to hemorrhoids. She had stopped her Eliquis andthe rectal bleeding resolved. Stool cards were negative and hemoglobin was stable. Colonoscopy found diverticulosis as well as hemorrhoids which seem to be the cause of the rectal bleeding. If recurrent rectal bleeding he recommended surgical removal of the hemorrhoids  InJuly2019, she wascomplaining of increased stress taking care of her husband. She was tachycardic in the office just despite taking her diltiazem. 48-hour monitor showed normal sinus rhythm with occasional PACs and PVCs, no significant bradycardia average heart rate 96 bpm. On follow-up in July she was still under a lot of stress taking care of her husband as well over 2 grandchildren one who is autistic. Heart rate was still 98 blood pressure was up.[name], he had bypass surgery 40 years ago in [place]. It appears that now he has an LVAD.- Her husband passed away on comfort care in 11/2018.  Since the last visit: "Patient was seen in ER on 07/29/18 with symptoms of dizziness and palpitations and was found to be in afib with RVR. She was restarted on Eliquis for a CHADS2VASC score of 6and discharged in rate controlled afib. "  She was seen in A. fib clinic.  She was in normal sinus rhythm at the end of January.  At the 03/2019 visit, she has had leg swelling and right hand swelling.  She had a splinter which she removed, but the hand has swollen.  She has been seen by Palliative care in 8/21.  Torsemide was decreased to 10 mg daily to reduce inconvenience of frequent urination.  She stopped Eliquis due to some BRBPR.  SHe did not want to see GI due to difficulty getting out of the house.  SHe is more comfortable being off of Eliquis according to the palliative care note.   Hand issue was treated with antibiotics, which led to a UTI.    She has been bothered by sciatica, and so she does not want to have to go to the bathroom as much.    She again confirms that she will continue to hold Eliquis, understanding the risk of stroke.   Denies : Chest pain. Dizziness. Nitroglycerin use. Orthopnea. Palpitations. Paroxysmal nocturnal dyspnea.  Syncope.   She paces her activity. Reports leg edema and back pain related to sciatica.  Past Medical History:  Diagnosis Date  . A-fib ([place])   . Adenocarcinoma of right lung, stage 3 ([place]) 07/28/2016  . Atrial flutter ([place]) 10/05/2016  . Bronchitis    hx of  . Cancer [facility]) 2004   uterine/cervical  . Cancer-related pain 10/06/2016  . Coronary artery disease 02/21/2017   JAN 2019 Prox RCA lesion is 25% stenosed. Prox Cx lesion is 30% stenosed. Mid Cx lesion is 25% stenosed. Ost 3rd Mrg  lesion is 30% stenosed. Prox LAD lesion is 25% stenosed. Mid LAD lesion is 25% stenosed. The left ventricular systolic function is normal. LV end diastolic pressure is normal. The left ventricular ejection fraction is 55-65% by visual estimate. There is no aortic valve stenosis.   N  . Diabetes mellitus    type 2  . Edema 07/12/2017  . Essential hypertension 01/20/2009  . Gallstones   . Headache   . History of radiation therapy 08/09/2016 to 09/19/2016   Right lung was treated to 60 Gy in 30 fractions at 2 Gy per fraction  . Hypertension   . Hypertriglyceridemia 08/22/2012  . Low back pain   . Lung mass    with lymphadenopathy  . Osteoarthritis   . Type II diabetes mellitus with manifestations ([place]) 01/20/2009   Estimated Creatinine Clearance: 49.8 mL/min (by C-G formula based on SCr of 1 mg/dL).    Past Surgical History:  Procedure Laterality Date  . ABDOMINAL HYSTERECTOMY  2004  . APPENDECTOMY  when 79 years old  . COLONOSCOPY  08-05-09   Nicole [name]  . I & D EXTREMITY Right 04/04/2019   Procedure: IRRIGATION AND DEBRIDEMENT RIGHT HAND;  Surgeon: Nicole Mould, MD;  Location: [facility];  Service: Orthopedics;  Laterality: Right;  . INCONTINENCE SURGERY    . KNEE ARTHROSCOPY Left   . LEFT HEART CATH AND CORONARY ANGIOGRAPHY N/A 02/24/2017   Procedure: LEFT HEART CATH AND CORONARY ANGIOGRAPHY;  Surgeon: Nicole Booze, MD;  Location: [facility];  Service: Cardiovascular;  Laterality: N/A;  . LEFT HEART CATHETERIZATION WITH CORONARY ANGIOGRAM N/A 07/19/2013   Procedure: LEFT HEART CATHETERIZATION WITH CORONARY ANGIOGRAM;  Surgeon: Nicole Grooms, MD;  Location[facility] C[facility] LAB;  Service: Cardiovascular;  Laterality: N/A;  . POLYPECTOMY  08-05-09   2 polyps  . TOTAL KNEE ARTHROPLASTY Left 03/28/2012   Procedure: LEFT TOTAL KNEE ARTHROPLASTY;  Surgeon: Nicole Bastos, MD;  Location: [facility];  Service: Orthopedics;  Laterality: Left;  . TUBAL LIGATION    . VIDEO BRONCHOSCOPY  WITH ENDOBRONCHIAL ULTRASOUND N/A 07/11/2016   Procedure: VIDEO BRONCHOSCOPY WITH ENDOBRONCHIAL ULTRASOUND;  Surgeon: Nicole Garfinkel, MD;  Location: [facility];  Service: Pulmonary;  Laterality: N/A;     Current Outpatient Medications  Medication Sig Dispense Refill  . b complex vitamins tablet Take 1 tablet by mouth daily.    . calcium-vitamin D (OSCAL WITH D) 500-200 MG-UNIT tablet Take 2 tablets by mouth daily with breakfast.    . Collagenase POWD by Does not apply route daily.     . CONTOUR NEXT TEST test strip USE 1 STRIP TO CHECK GLUCOSE TWICE DAILY 200 each 2  . diltiazem (CARDIZEM CD) 360 MG 24 hr capsule TAKE 1 CAPSULE(360 MG) BY MOUTH DAILY 90 capsule 2  . glucose blood test strip Contour Next Test Strips  USE 1 STRIP TO CHECK GLUCOSE TWICE DAILY    . hydrocortisone (  ANUSOL-HC) 2.5 % rectal cream Place 1 application rectally 3 (three) times daily. Use for 10 days. 30 g 1  . insulin glargine, 2 Unit Dial, (TOUJEO MAX SOLOSTAR) 300 UNIT/ML Solostar Pen Inject 30 Units into the skin daily. 9 mL 0  . Insulin Pen Needle (NOVOFINE) 32G X 6 MM MISC 1 Act by Does not apply route daily. 100 each 1  . Lancets (ACCU-CHEK SOFT TOUCH) lancets Use to check blood sugars daily Dx E11.9 100 each 3  . metFORMIN (GLUCOPHAGE-XR) 500 MG 24 hr tablet TAKE 2 TABLETS(1000 MG) BY MOUTH DAILY WITH BREAKFAST 180 tablet 0  . metoprolol tartrate (LOPRESSOR) 25 MG tablet Take 1 tablet every 8 hours AS NEEDED for rapid heart rate 30 tablet 1  . Multiple Vitamins-Minerals (MULTIPLE VITAMINS/WOMENS PO) Take by mouth daily.    [name] omega-3 acid ethyl esters (LOVAZA) 1 g capsule Take 2 capsules (2 g total) by mouth 2 (two) times daily. 360 capsule 1  . PREBIOTIC PRODUCT PO Take 1 capsule by mouth daily.    . rosuvastatin (CRESTOR) 5 MG tablet Take 1 tablet (5 mg total) by mouth daily. 90 tablet 1  . torsemide (DEMADEX) 20 MG tablet Take 1 tablet (20 mg total) by mouth every other day. (Patient taking differently: Take 20 mg  by mouth every other day. 09/05/2019 changed to 10mg  qd)    . Turmeric 500 MG CAPS Take 1,000 mg by mouth daily.     No current facility-administered medications for this visit.    Allergies:   Patient has no known allergies.    Social History:  The patient  reports that she has never smoked. She has never used smokeless tobacco. She reports that she does not drink alcohol and does not use drugs.   Family History:  The patient's family history includes Arthritis in an other family member; Cancer in an other family member; Diabetes in an other family member; Heart attack in her father; Heart disease in her father; Hyperlipidemia in an other family member; Hypertension in an other family member.    ROS:  Please see the history of present illness.   Otherwise, review of systems are positive for .   All other systems are reviewed and negative.    PHYSICAL EXAM: VS:  BP 132/84   Pulse 80   Ht 5\' 8"  (1.727 m)   Wt 181 lb 12.8 oz (82.5 kg)   SpO2 98%   BMI 27.64 kg/m  , BMI Body mass index is 27.64 kg/m. GEN: Well nourished, well developed, in no acute distress  HEENT: normal  Neck: no JVD, carotid bruits, or masses Cardiac: RRR; no murmurs, rubs, or gallops,; 2+ LE pitting edema  Respiratory:  clear to auscultation bilaterally, normal work of breathing GI: soft, nontender, nondistended, + BS MS: no deformity or atrophy  Skin: warm and dry, no rash Neuro:  Strength and sensation are intact Psych: euthymic mood, full affect   EKG:   The ekg ordered August 21, 2019 demonstrates AFib, controlled rate   Recent Labs: 07/15/2019: ALT 55; Hemoglobin 15.4; Platelet Count 209 08/21/2019: BUN 24; Creatinine, Ser 0.93; Potassium 4.1; Sodium 138   Lipid Panel    Component Value Date/Time   CHOL 185 02/05/2019 1137   TRIG 162.0 (H) 02/05/2019 1137   HDL 48.70 02/05/2019 1137   CHOLHDL 4 02/05/2019 1137   VLDL 32.4 02/05/2019 1137   LDLCALC 104 (H) 02/05/2019 1137   LDLDIRECT 108.0  07/18/2018 1549     Other  studies Reviewed: Additional studies/ records that were reviewed today with results demonstrating: palliative records reviewed.   ASSESSMENT AND PLAN:  1. Atrial flutter: She decided to come off of Eliquis knowing the risks of stroke.  She discussed this with palliative care as well.  Sounds like she is in NSR today, with PACs.  Regardless, rate is controlled. She is clear about staying off of anticoagulation at this time. Normal LVEF.  2. HTN: The current medical regimen is effective;  continue present plan and medications. 3. CAD: Mild by 2019 cath.  No angina.  4. DM2: A1C 8.8.  Healthy diet. Exercise limited.  5. LE edema: Elevate legs.  COntinue torsemide.  Can take higher dose but does not like the frequent urination.  She prefers to continue on the lower dose, or lower frequency.     Current medicines are reviewed at length with the patient today.  The patient concerns regarding her medicines were addressed.  The following changes have been made:  No change  Labs/ tests ordered today include:  No orders of the defined types were placed in this encounter.   Recommend 150 minutes/week of aerobic exercise Low fat, low carb, high fiber diet recommended  Disposition:   FU in 1 year   Signed, Larae Grooms, MD  09/20/2019 11:47 AM    [facility] [address], [place], [place]  [address] Phone: [phone]; Fax: [phone]

## 2019-09-20 ENCOUNTER — Other Ambulatory Visit: Payer: Self-pay

## 2019-09-20 ENCOUNTER — Ambulatory Visit: Payer: Medicare Other | Admitting: Interventional Cardiology

## 2019-09-20 ENCOUNTER — Other Ambulatory Visit: Payer: Self-pay | Admitting: Internal Medicine

## 2019-09-20 ENCOUNTER — Encounter: Payer: Self-pay | Admitting: Interventional Cardiology

## 2019-09-20 VITALS — BP 132/84 | HR 80 | Ht 68.0 in | Wt 181.8 lb

## 2019-09-20 DIAGNOSIS — E118 Type 2 diabetes mellitus with unspecified complications: Secondary | ICD-10-CM

## 2019-09-20 DIAGNOSIS — I4819 Other persistent atrial fibrillation: Secondary | ICD-10-CM

## 2019-09-20 DIAGNOSIS — I2510 Atherosclerotic heart disease of native coronary artery without angina pectoris: Secondary | ICD-10-CM

## 2019-09-20 DIAGNOSIS — D6869 Other thrombophilia: Secondary | ICD-10-CM

## 2019-09-20 DIAGNOSIS — I10 Essential (primary) hypertension: Secondary | ICD-10-CM

## 2019-09-20 NOTE — Patient Instructions (Signed)

## 2019-09-23 ENCOUNTER — Other Ambulatory Visit: Payer: Self-pay

## 2019-09-23 ENCOUNTER — Ambulatory Visit: Payer: Medicare Other | Admitting: Internal Medicine

## 2019-09-23 ENCOUNTER — Encounter: Payer: Self-pay | Admitting: Internal Medicine

## 2019-09-23 VITALS — BP 124/78 | HR 68 | Temp 98.4°F | Resp 20 | Ht 68.0 in | Wt 183.0 lb

## 2019-09-23 DIAGNOSIS — E118 Type 2 diabetes mellitus with unspecified complications: Secondary | ICD-10-CM | POA: Diagnosis not present

## 2019-09-23 DIAGNOSIS — I480 Paroxysmal atrial fibrillation: Secondary | ICD-10-CM | POA: Diagnosis not present

## 2019-09-23 DIAGNOSIS — C3491 Malignant neoplasm of unspecified part of right bronchus or lung: Secondary | ICD-10-CM | POA: Diagnosis not present

## 2019-09-23 DIAGNOSIS — M48061 Spinal stenosis, lumbar region without neurogenic claudication: Secondary | ICD-10-CM

## 2019-09-23 DIAGNOSIS — Z23 Encounter for immunization: Secondary | ICD-10-CM | POA: Diagnosis not present

## 2019-09-23 DIAGNOSIS — I4892 Unspecified atrial flutter: Secondary | ICD-10-CM

## 2019-09-24 ENCOUNTER — Encounter: Payer: Self-pay | Admitting: Internal Medicine

## 2019-09-24 NOTE — Progress Notes (Addendum)
Subjective:  Patient ID: Nicole [name], female    DOB: 1940-09-21  Age: 79 y.o. MRN: [id number]  CC: Back Pain, Diabetes, and Atrial Fibrillation  This visit occurred during the SARS-CoV-2 public health emergency.  Safety protocols were in place, including screening questions prior to the visit, additional usage of staff PPE, and extensive cleaning of exam room while observing appropriate contact time as indicated for disinfecting solutions.    HPI Nicole [name] presents for f/up -   Reason for Visit: Mobility Evaluation   Patient suffers from Chronic Back Pain, Adenocarcinoma of Right Lung, Spinal Stenosis, A fib and Type 2 Diabetes which impairs their ability to perform daily activities like toileting, feeding, dressing, grooming, bathing. In the home a cane, walker or crutch will not resolve their issues with performing activities of daily living due to pain, poor endurance, and high risk of falls.  A scooter does not fit into her floor plan.  A power wheelchair will allow patient to safely perform daily activities. Patient can safely operate the power wheelchair in the home. Patient has the mental and physical capacity to follow and operate the electric devices safety instructions. Patient is willing, able, and motivated to use in the home as well as outside of the home. She is not able to use a manual wheelchair due to pain and weakness in her arms.    Outpatient Medications Prior to Visit  Medication Sig Dispense Refill  . b complex vitamins tablet Take 1 tablet by mouth daily.    . calcium-vitamin D (OSCAL WITH D) 500-200 MG-UNIT tablet Take 2 tablets by mouth daily with breakfast.    . Collagenase POWD by Does not apply route daily.     . CONTOUR NEXT TEST test strip USE 1 STRIP TO CHECK GLUCOSE TWICE DAILY 200 each 2  . diltiazem (CARDIZEM CD) 360 MG 24 hr capsule TAKE 1 CAPSULE(360 MG) BY MOUTH DAILY 90 capsule 2  . glucose blood test strip Contour Next Test Strips  USE 1 STRIP  TO CHECK GLUCOSE TWICE DAILY    . Insulin Pen Needle (NOVOFINE) 32G X 6 MM MISC 1 Act by Does not apply route daily. 100 each 1  . Lancets (ACCU-CHEK SOFT TOUCH) lancets Use to check blood sugars daily Dx E11.9 100 each 3  . metFORMIN (GLUCOPHAGE-XR) 500 MG 24 hr tablet TAKE 2 TABLETS(1000 MG) BY MOUTH DAILY WITH BREAKFAST 180 tablet 0  . metoprolol tartrate (LOPRESSOR) 25 MG tablet Take 1 tablet every 8 hours AS NEEDED for rapid heart rate 30 tablet 1  . Multiple Vitamins-Minerals (MULTIPLE VITAMINS/WOMENS PO) Take by mouth daily.    [name] omega-3 acid ethyl esters (LOVAZA) 1 g capsule Take 2 capsules (2 g total) by mouth 2 (two) times daily. 360 capsule 1  . PREBIOTIC PRODUCT PO Take 1 capsule by mouth daily.    [name] torsemide (DEMADEX) 20 MG tablet Take 1 tablet (20 mg total) by mouth every other day. (Patient taking differently: Take 20 mg by mouth every other day. 09/05/2019 changed to 10mg  qd)    . Turmeric 500 MG CAPS Take 1,000 mg by mouth daily.    . hydrocortisone (ANUSOL-HC) 2.5 % rectal cream Place 1 application rectally 3 (three) times daily. Use for 10 days. 30 g 1  . insulin glargine, 2 Unit Dial, (TOUJEO MAX SOLOSTAR) 300 UNIT/ML Solostar Pen Inject 30 Units into the skin daily. 9 mL 0  . rosuvastatin (CRESTOR) 5 MG tablet Take 1 tablet (5 mg  total) by mouth daily. 90 tablet 1   No facility-administered medications prior to visit.    ROS Review of Systems  Constitutional: Positive for activity change and fatigue. Negative for diaphoresis and unexpected weight change.  HENT: Negative for trouble swallowing.   Respiratory: Positive for shortness of breath. Negative for cough, chest tightness and wheezing.   Cardiovascular: Positive for leg swelling. Negative for chest pain and palpitations.  Gastrointestinal: Positive for anal bleeding. Negative for abdominal pain, blood in stool, constipation, diarrhea, nausea and vomiting.  Genitourinary: Negative.  Negative for difficulty urinating.    Musculoskeletal: Positive for arthralgias, back pain and gait problem. Negative for myalgias.  Skin: Negative.   Neurological: Positive for weakness. Negative for dizziness and light-headedness.  Hematological: Negative for adenopathy. Does not bruise/bleed easily.  Psychiatric/Behavioral: Negative.     Objective:  BP 124/78   Pulse 68   Temp 98.4 F (36.9 C) (Oral)   Resp 20   Ht 5\' 8"  (1.727 m)   Wt 183 lb (83 kg)   SpO2 97%   BMI 27.83 kg/m   BP Readings from Last 3 Encounters:  10/11/19 110/80  09/23/19 124/78  09/20/19 132/84    Wt Readings from Last 3 Encounters:  09/23/19 183 lb (83 kg)  09/20/19 181 lb 12.8 oz (82.5 kg)  08/21/19 190 lb 3.2 oz (86.3 kg)    Physical Exam Vitals reviewed.  HENT:     Nose: Nose normal.     Mouth/Throat:     Mouth: Mucous membranes are moist.  Eyes:     General: No scleral icterus.    Conjunctiva/sclera: Conjunctivae normal.  Cardiovascular:     Rate and Rhythm: Normal rate. Rhythm irregularly irregular.     Heart sounds: No murmur heard.   Pulmonary:     Effort: Pulmonary effort is normal.     Breath sounds: No stridor. No wheezing, rhonchi or rales.  Abdominal:     General: Abdomen is flat.     Palpations: There is no mass.     Tenderness: There is no abdominal tenderness. There is no guarding.  Musculoskeletal:     Cervical back: Neck supple.     Lumbar back: Decreased range of motion. Negative right straight leg raise test and negative left straight leg raise test.     Right knee: No swelling. Decreased range of motion.     Left knee: No swelling. Decreased range of motion.     Right lower leg: 1+ Pitting Edema present.     Left lower leg: 1+ Pitting Edema present.     Comments: Her ROM falls within functional limits.  Skin:    Coloration: Skin is not jaundiced.     Findings: No rash.  Neurological:     Mental Status: She is alert and oriented to person, place, and time. Mental status is at baseline.      Motor: Weakness present.     Coordination: Coordination abnormal.     Gait: Gait abnormal.     Comments: Strength -  RUE 3/5 LUE 2/5 RLE 3/5 LLE 2/5 Both feet 2/5  Psychiatric:        Mood and Affect: Mood normal.        Behavior: Behavior normal.     Lab Results  Component Value Date   WBC 9.1 10/21/2019   HGB 13.3 10/21/2019   HCT 40.6 10/21/2019   PLT 233 10/21/2019   GLUCOSE 158 (H) 10/21/2019   CHOL 185 02/05/2019   TRIG 162.0 (  H) 02/05/2019   HDL 48.70 02/05/2019   LDLDIRECT 108.0 07/18/2018   LDLCALC 104 (H) 02/05/2019   ALT 26 10/21/2019   AST 13 (L) 10/21/2019   NA 133 (L) 10/21/2019   K 4.4 10/21/2019   CL 103 10/21/2019   CREATININE 1.01 (H) 10/21/2019   BUN 23 10/21/2019   CO2 25 10/21/2019   TSH 0.427 08/01/2018   INR 1.1 04/02/2019   HGBA1C 8.8 (A) 06/17/2019   MICROALBUR 1.6 07/18/2018    No results found.  Assessment & Plan:   Alica was seen today for back pain, diabetes and atrial fibrillation.  Diagnoses and all orders for this visit:  Adenocarcinoma of right lung, stage 3 ([place]) -     DME Wheelchair electric  Atrial flutter, unspecified type ([place]) -     DME Wheelchair electric  Paroxysmal atrial fibrillation ([place])- She has good rate control.  She is not willing to be anticoagulated due to a history of bleeding and bruising. -     DME Wheelchair electric  Type II diabetes mellitus with manifestations ([place])- Her blood sugars have been adequately well controlled. -     DME Wheelchair electric  Spinal stenosis at L4-L5 level -     DME Wheelchair electric  Other orders -     Flu Vaccine QUAD 6+ mos PF IM (Fluarix Quad PF)   I am having Nicole [name] maintain her metoprolol tartrate, Multiple Vitamins-Minerals (MULTIPLE VITAMINS/WOMENS PO), Turmeric, b complex vitamins, calcium-vitamin D, PREBIOTIC PRODUCT PO, Collagenase, Contour Next Test, omega-3 acid ethyl esters, glucose blood, NovoFine, accu-chek soft touch, diltiazem, torsemide,  and metFORMIN.  No orders of the defined types were placed in this encounter.    Follow-up: Return in about 3 months (around 12/24/2019).  Scarlette Calico, MD

## 2019-09-24 NOTE — Patient Instructions (Signed)
Type 2 Diabetes Mellitus, Diagnosis, Adult Type 2 diabetes (type 2 diabetes mellitus) is a long-term (chronic) disease. In type 2 diabetes, one or both of these problems may be present:  The pancreas does not make enough of a hormone called insulin.  Cells in the body do not respond properly to insulin that the body makes (insulin resistance). Normally, insulin allows blood sugar (glucose) to enter cells in the body. The cells use glucose for energy. Insulin resistance or lack of insulin causes excess glucose to build up in the blood instead of going into cells. As a result, high blood glucose (hyperglycemia) develops. What increases the risk? The following factors may make you more likely to develop type 2 diabetes:  Having a family member with type 2 diabetes.  Being overweight or obese.  Having an inactive (sedentary) lifestyle.  Having been diagnosed with insulin resistance.  Having a history of prediabetes, gestational diabetes, or polycystic ovary syndrome (PCOS).  Being of American-Indian, African-American, Hispanic/Latino, or Asian/Pacific Islander descent. What are the signs or symptoms? In the early stage of this condition, you may not have symptoms. Symptoms develop slowly and may include:  Increased thirst (polydipsia).  Increased hunger(polyphagia).  Increased urination (polyuria).  Increased urination during the night (nocturia).  Unexplained weight loss.  Frequent infections that keep coming back (recurring).  Fatigue.  Weakness.  Vision changes, such as blurry vision.  Cuts or bruises that are slow to heal.  Tingling or numbness in the hands or feet.  Dark patches on the skin (acanthosis nigricans). How is this diagnosed? This condition is diagnosed based on your symptoms, your medical history, a physical exam, and your blood glucose level. Your blood glucose may be checked with one or more of the following blood tests:  A fasting blood glucose (FBG)  test. You will not be allowed to eat (you will fast) for 8 hours or longer before a blood sample is taken.  A random blood glucose test. This test checks blood glucose at any time of day regardless of when you ate.  An A1c (hemoglobin A1c) blood test. This test provides information about blood glucose control over the previous 2-3 months.  An oral glucose tolerance test (OGTT). This test measures your blood glucose at two times: ? After fasting. This is your baseline blood glucose level. ? Two hours after drinking a beverage that contains glucose. You may be diagnosed with type 2 diabetes if:  Your FBG level is 126 mg/dL (7.0 mmol/L) or higher.  Your random blood glucose level is 200 mg/dL (11.1 mmol/L) or higher.  Your A1c level is 6.5% or higher.  Your OGTT result is higher than 200 mg/dL (11.1 mmol/L). These blood tests may be repeated to confirm your diagnosis. How is this treated? Your treatment may be managed by a specialist called an endocrinologist. Type 2 diabetes may be treated by following instructions from your health care provider about:  Making diet and lifestyle changes. This may include: ? Following an individualized nutrition plan that is developed by a diet and nutrition specialist (registered dietitian). ? Exercising regularly. ? Finding ways to manage stress.  Checking your blood glucose level as often as told.  Taking diabetes medicines or insulin daily. This helps to keep your blood glucose levels in the healthy range. ? If you use insulin, you may need to adjust the dosage depending on how physically active you are and what foods you eat. Your health care provider will tell you how to adjust your dosage.    Taking medicines to help prevent complications from diabetes, such as: ? Aspirin. ? Medicine to lower cholesterol. ? Medicine to control blood pressure. Your health care provider will set individualized treatment goals for you. Your goals will be based on  your age, other medical conditions you have, and how you respond to diabetes treatment. Generally, the goal of treatment is to maintain the following blood glucose levels:  Before meals (preprandial): 80-130 mg/dL (4.4-7.2 mmol/L).  After meals (postprandial): below 180 mg/dL (10 mmol/L).  A1c level: less than 7%. Follow these instructions at home: Questions to ask your health care provider  Consider asking the following questions: ? Do I need to meet with a diabetes educator? ? Where can I find a support group for people with diabetes? ? What equipment will I need to manage my diabetes at home? ? What diabetes medicines do I need, and when should I take them? ? How often do I need to check my blood glucose? ? What number can I call if I have questions? ? When is my next appointment? General instructions  Take over-the-counter and prescription medicines only as told by your health care provider.  Keep all follow-up visits as told by your health care provider. This is important.  For more information about diabetes, visit: ? American Diabetes Association (ADA): www.diabetes.org ? American Association of Diabetes Educators (AADE): www.diabeteseducator.org Contact a health care provider if:  Your blood glucose is at or above 240 mg/dL (13.3 mmol/L) for 2 days in a row.  You have been sick or have had a fever for 2 days or longer, and you are not getting better.  You have any of the following problems for more than 6 hours: ? You cannot eat or drink. ? You have nausea and vomiting. ? You have diarrhea. Get help right away if:  Your blood glucose is lower than 54 mg/dL (3.0 mmol/L).  You become confused or you have trouble thinking clearly.  You have difficulty breathing.  You have moderate or large ketone levels in your urine. Summary  Type 2 diabetes (type 2 diabetes mellitus) is a long-term (chronic) disease. In type 2 diabetes, the pancreas does not make enough of a  hormone called insulin, or cells in the body do not respond properly to insulin that the body makes (insulin resistance).  This condition is treated by making diet and lifestyle changes and taking diabetes medicines or insulin.  Your health care provider will set individualized treatment goals for you. Your goals will be based on your age, other medical conditions you have, and how you respond to diabetes treatment.  Keep all follow-up visits as told by your health care provider. This is important. This information is not intended to replace advice given to you by your health care provider. Make sure you discuss any questions you have with your health care provider. Document Revised: 03/10/2017 Document Reviewed: 02/13/2015 Elsevier Patient Education  2020 Elsevier Inc.  

## 2019-09-25 ENCOUNTER — Other Ambulatory Visit: Payer: Self-pay | Admitting: Internal Medicine

## 2019-09-25 DIAGNOSIS — E118 Type 2 diabetes mellitus with unspecified complications: Secondary | ICD-10-CM

## 2019-09-25 DIAGNOSIS — I10 Essential (primary) hypertension: Secondary | ICD-10-CM

## 2019-10-01 DIAGNOSIS — Z20828 Contact with and (suspected) exposure to other viral communicable diseases: Secondary | ICD-10-CM | POA: Diagnosis not present

## 2019-10-04 ENCOUNTER — Other Ambulatory Visit: Payer: Self-pay

## 2019-10-04 ENCOUNTER — Telehealth: Payer: Self-pay | Admitting: Internal Medicine

## 2019-10-04 NOTE — Telephone Encounter (Signed)
Patient states her sugars are running to low in the morning - They are 91, 96, and after she eats its around 116. She waking up at night sweating. She states her sugars have never been this low.   insulin glargine, 2 Unit Dial, (TOUJEO MAX SOLOSTAR) 300 UNIT/ML Solostar Pen Please follow up with patient, Thank you.

## 2019-10-07 ENCOUNTER — Other Ambulatory Visit: Payer: Self-pay | Admitting: Internal Medicine

## 2019-10-07 ENCOUNTER — Telehealth: Payer: Self-pay | Admitting: Internal Medicine

## 2019-10-07 DIAGNOSIS — E118 Type 2 diabetes mellitus with unspecified complications: Secondary | ICD-10-CM

## 2019-10-07 NOTE — Telephone Encounter (Signed)
   Patient states she fell at home over the weekend. She did not go to the ED. No visible injury.  Patient states her leg felt weak wants to know if xray should be ordered or should she get an appointment with Dr Smith/Corey to eval.

## 2019-10-10 ENCOUNTER — Telehealth: Payer: Self-pay | Admitting: Internal Medicine

## 2019-10-10 NOTE — Telephone Encounter (Signed)
    Waunita Schooner from [employer] calling, he has faxed a form for completion. Please let him know if completed  Phone [phone]

## 2019-10-11 ENCOUNTER — Ambulatory Visit: Payer: Medicare Other | Admitting: Family Medicine

## 2019-10-11 ENCOUNTER — Other Ambulatory Visit: Payer: Self-pay

## 2019-10-11 ENCOUNTER — Ambulatory Visit (INDEPENDENT_AMBULATORY_CARE_PROVIDER_SITE_OTHER): Payer: Medicare Other

## 2019-10-11 ENCOUNTER — Encounter: Payer: Self-pay | Admitting: Family Medicine

## 2019-10-11 VITALS — BP 110/80 | HR 63 | Ht 68.0 in

## 2019-10-11 DIAGNOSIS — Z96652 Presence of left artificial knee joint: Secondary | ICD-10-CM | POA: Diagnosis not present

## 2019-10-11 DIAGNOSIS — G8929 Other chronic pain: Secondary | ICD-10-CM

## 2019-10-11 DIAGNOSIS — M545 Low back pain, unspecified: Secondary | ICD-10-CM

## 2019-10-11 DIAGNOSIS — M25562 Pain in left knee: Secondary | ICD-10-CM | POA: Diagnosis not present

## 2019-10-11 NOTE — Telephone Encounter (Signed)
Decrease sugar to 25 units.  Monitor sugars and keep a log.   Set up follow up with Dr Ronnald Ramp.

## 2019-10-11 NOTE — Patient Instructions (Addendum)
Thank you for coming in today.  OK to continue acupuncture.  If needed could proceed to facet medial branch block and ablation.  Continue PT.   If I get supposed on he xray there are more tests to do for the spine.

## 2019-10-11 NOTE — Progress Notes (Signed)
Nicole [name], am serving as a Education administrator for [employer] This visit occurred during the SARS-CoV-2 public health emergency.  Safety protocols were in place, including screening questions prior to the visit, additional usage of staff PPE, and extensive cleaning of exam room while observing appropriate contact time as indicated for disinfecting solutions.   Nicole [name] is a 79 y.o. female who presents to [place] at [facility] today for f/u of LBP and associated leg weakness and falling.  She was last seen by Dr. Georgina Snell on 09/11/19 w/ worsening LBP and was referred for home health PT.  She was also advised to use TENs and Voltaren gel.  Since her last visit, pt reports that she fell 2x last Friday flat onto her back after her right leg gave out on her. Having pain in left knee. History of TKR. 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   Diagnostic imaging: L-spine XR- 08/01/19; L hip XR- 05/08/19; L-spine MRI- 01/23/18   Pertinent review of systems: [name] fevers or chills  Relevant historical information: General poor health with significant vascular disease and heart disease.  Also has diabetes and history of right lung cancer.   Exam:  BP 110/80   Pulse 63   Ht 5\' 8"  (1.727 m)   SpO2 96%   BMI 27.83 kg/m  General: Generally ill-appearing seated in wheelchair.  Well nourished, and in [name] acute distress.   MSK: L-spine normal-appearing Nontender midline.  Tender palpation lumbar paraspinal musculature.  Decreased lumbar motion. Lower extremity strength is reduced bilaterally.  Left hip normal-appearing normal motion.  Tender palpation greater trochanter.  Left knee normal-appearing nontender normal motion    Lab and Radiology Results  X-ray images L-spine and left knee obtained today personally and independently reviewed.  L-spine: Significant degenerative changes and spondylosis.  [name] acute fractures per my interpretation.  Left knee:  Intact surgical hardware TKR.  [name] acute fractures.  Await formal radiology review    Assessment and Plan: 79 y.o. female with acute fall and worsening back pain and leg pain in the setting of worsening chronic lower extremity weakness. She has been receiving home health physical therapy which generally was helping until she fell.  She had worsening pain since the fall.  X-rays today do not show fractures per my interpretation however radiology overread is still pending.  Plan for continued conservative management with home health physical therapy and over-the-counter medications that she has been using them.  Recheck if not improving.   PDMP not reviewed this encounter. Orders Placed This Encounter  Procedures  . DG Knee AP/LAT W/Sunrise Left    Standing Status:   Future    Number of Occurrences:   1    Standing Expiration Date:   10/10/2020    Order Specific Question:   Reason for Exam (SYMPTOM  OR DIAGNOSIS REQUIRED)    Answer:   left knee pain    Order Specific Question:   Preferred imaging location?    Answer:   [facility]    Order Specific Question:   Radiology Contrast Protocol - do NOT remove file path    Answer:   [email or web address]  . DG Lumbar Spine 2-3 Views    Standing Status:   Future    Number of Occurrences:   1    Standing Expiration Date:   10/10/2020    Order Specific Question:   Reason for Exam (SYMPTOM  OR DIAGNOSIS REQUIRED)  Answer:   lumbar spine pain    Order Specific Question:   Preferred imaging location?    Answer:   [facility]    Order Specific Question:   Radiology Contrast Protocol - do NOT remove file path    Answer:   [email or web address].[email or web address]  . Ambulatory referral to Home Health    Referral Priority:   Routine    Referral Type:   Home Health Care    Referral Reason:   Specialty Services Required    Requested Specialty:   [place]    Number of Visits Requested:   1   [name] orders of the defined types were placed in this encounter.    Discussed warning signs or symptoms. Please see discharge instructions. Patient expresses understanding.   The above documentation has been reviewed and is accurate and complete Lynne Leader, M.D.  Total encounter time 30 minutes including face-to-face time with the patient and, reviewing past medical record, and charting on the date of service.

## 2019-10-14 ENCOUNTER — Other Ambulatory Visit: Payer: Self-pay | Admitting: Family Medicine

## 2019-10-14 ENCOUNTER — Telehealth: Payer: Self-pay | Admitting: Internal Medicine

## 2019-10-14 ENCOUNTER — Telehealth: Payer: Self-pay | Admitting: Family Medicine

## 2019-10-14 DIAGNOSIS — M25562 Pain in left knee: Secondary | ICD-10-CM

## 2019-10-14 DIAGNOSIS — E118 Type 2 diabetes mellitus with unspecified complications: Secondary | ICD-10-CM

## 2019-10-14 DIAGNOSIS — S32050A Wedge compression fracture of fifth lumbar vertebra, initial encounter for closed fracture: Secondary | ICD-10-CM

## 2019-10-14 MED ORDER — TOUJEO MAX SOLOSTAR 300 UNIT/ML ~~LOC~~ SOPN: 30.0000 [IU] | PEN_INJECTOR | Freq: Every day | SUBCUTANEOUS | 0 refills | Status: AC

## 2019-10-14 NOTE — Telephone Encounter (Signed)
     [facility] requesting order for skilled nursing, wound care. Patient has a place on her buttock that may be turning into a pressure sore.  Phone [phone], ok to leave message- Timmothy Sours

## 2019-10-14 NOTE — Progress Notes (Signed)
X-ray shows some concern for compression fracture at L2.  I have ordered a CT scan at [facility] to further evaluate compression fracture.  You should hear soon about scheduling.

## 2019-10-14 NOTE — Telephone Encounter (Signed)
CT scan lumbar spine ordered to further characterize compression fracture.

## 2019-10-14 NOTE — Telephone Encounter (Signed)
No pre cert required for the CT scan has been sent to [facility]

## 2019-10-14 NOTE — Addendum Note (Signed)
Addended by: Binnie Rail on: 10/14/2019 12:33 PM   Modules accepted: Orders

## 2019-10-14 NOTE — Telephone Encounter (Signed)
    1.Medication Requested: insulin glargine, 2 Unit Dial, (TOUJEO MAX SOLOSTAR) 300 UNIT/ML Solostar Pen  2. Pharmacy (Name, Street, [place]):[facility] [phone] - [place], [place] - [place]  3. On Med List: yes  4. Last Visit with PCP: 09/23/19  5. Next visit date with PCP:   Agent: Please be advised that RX refills may take up to 3 business days. We ask that you follow-up with your pharmacy.

## 2019-10-14 NOTE — Telephone Encounter (Signed)
Okay to give orders as requested;

## 2019-10-14 NOTE — Telephone Encounter (Signed)
Prescription sent

## 2019-10-14 NOTE — Progress Notes (Signed)
Knee x-ray looks pretty normal to radiology without fracture.

## 2019-10-14 NOTE — Telephone Encounter (Signed)
Verbal orders given  

## 2019-10-15 NOTE — Telephone Encounter (Signed)
Waunita Schooner called, states did not receive the fax. Please fax again to [phone]

## 2019-10-15 NOTE — Telephone Encounter (Signed)
LVM instructing pt to decrease insulin to 25units, monitor blood sugar & keep a log; instructed her to call the office to make a f/u appt with Dr Ronnald Ramp.  Will try calling again later.

## 2019-10-15 NOTE — Telephone Encounter (Signed)
Noted  

## 2019-10-15 NOTE — Telephone Encounter (Signed)
Yes it was completed and faxed back.  Nicole [name] has been informed.

## 2019-10-15 NOTE — Telephone Encounter (Signed)
    [facility] calling to report there will be a delay in nursing eval. Patient reported to her that she was exposed to her son, who is COVID19 positive  Phone [phone]

## 2019-10-15 NOTE — Telephone Encounter (Signed)
Form was sent to scan last week. Will have to wait until it shows up in media tab.

## 2019-10-16 DIAGNOSIS — Z20828 Contact with and (suspected) exposure to other viral communicable diseases: Secondary | ICD-10-CM | POA: Diagnosis not present

## 2019-10-16 NOTE — Telephone Encounter (Signed)
MyChart message sent asking pt to call the office to discuss insulin & make appt with Dr Ronnald Ramp.

## 2019-10-18 ENCOUNTER — Telehealth (INDEPENDENT_AMBULATORY_CARE_PROVIDER_SITE_OTHER): Payer: Medicare Other | Admitting: Family

## 2019-10-18 ENCOUNTER — Encounter: Payer: Self-pay | Admitting: Internal Medicine

## 2019-10-18 ENCOUNTER — Encounter: Payer: Self-pay | Admitting: Family

## 2019-10-18 ENCOUNTER — Other Ambulatory Visit: Payer: Self-pay | Admitting: *Deleted

## 2019-10-18 DIAGNOSIS — M545 Low back pain, unspecified: Secondary | ICD-10-CM

## 2019-10-18 DIAGNOSIS — G8929 Other chronic pain: Secondary | ICD-10-CM

## 2019-10-18 DIAGNOSIS — K649 Unspecified hemorrhoids: Secondary | ICD-10-CM | POA: Diagnosis not present

## 2019-10-18 DIAGNOSIS — C3490 Malignant neoplasm of unspecified part of unspecified bronchus or lung: Secondary | ICD-10-CM

## 2019-10-18 MED ORDER — SULFAMETHOXAZOLE-TRIMETHOPRIM 800-160 MG PO TABS
1.0000 | ORAL_TABLET | Freq: Two times a day (BID) | ORAL | 0 refills | Status: DC
Start: 2019-10-18 — End: 2019-11-18

## 2019-10-18 MED ORDER — HYDROCORTISONE (PERIANAL) 2.5 % EX CREA: 1.0000 "application " | TOPICAL_CREAM | Freq: Three times a day (TID) | CUTANEOUS | 1 refills | Status: DC

## 2019-10-18 NOTE — Progress Notes (Signed)
TINEY ZIPPER is a 79 y.o. female with the following history as recorded in EpicCare:  Patient Active Problem List   Diagnosis Date Noted  . Persistent atrial fibrillation ([place]) 08/21/2019  . Senile purpura ([place]) 08/03/2019  . Decubitus ulcer of sacral region, stage 1 05/08/2019  . Diastolic dysfunction with chronic heart failure ([place]) 04/30/2019  . Paroxysmal atrial fibrillation ([place]) 02/20/2019  . Secondary hypercoagulable state ([place]) 02/20/2019  . External hemorrhoids 08/16/2018  . Hyperlipidemia with target LDL less than 100 07/18/2018  . Atherosclerosis of aorta ([place]) 07/18/2018  . Spinal stenosis at L4-L5 level 01/25/2018  . Arthritis of sacroiliac joint 01/11/2018  . Coronary artery disease 02/21/2017  . Pneumonitis, radiation ([place]) 01/26/2017  . Encounter for antineoplastic immunotherapy 10/12/2016  . Cancer-related pain 10/06/2016  . Atrial flutter ([place]) 10/05/2016  . Adenocarcinoma of right lung, stage 3 ([place]) 07/28/2016  . Encounter for antineoplastic chemotherapy 07/28/2016  . Pancreatic cyst 11/09/2015  . Routine general medical examination at a health care facility 06/30/2014  . Gout 01/18/2013  . Hypertriglyceridemia 08/22/2012  . Osteopenia 10/31/2011  . Type II diabetes mellitus with manifestations ([place]) 01/20/2009  . Essential hypertension 01/20/2009  . Osteoarthritis 01/20/2009    Current Outpatient Medications  Medication Sig Dispense Refill  . b complex vitamins tablet Take 1 tablet by mouth daily.    . calcium-vitamin D (OSCAL WITH D) 500-200 MG-UNIT tablet Take 2 tablets by mouth daily with breakfast.    . Collagenase POWD by Does not apply route daily.     . CONTOUR NEXT TEST test strip USE 1 STRIP TO CHECK GLUCOSE TWICE DAILY 200 each 2  . diltiazem (CARDIZEM CD) 360 MG 24 hr capsule TAKE 1 CAPSULE(360 MG) BY MOUTH DAILY 90 capsule 2  . glucose blood test strip Contour Next Test Strips  USE 1 STRIP TO CHECK GLUCOSE TWICE DAILY    . hydrocortisone  (ANUSOL-HC) 2.5 % rectal cream Place 1 application rectally 3 (three) times daily. Use for 10 days. 30 g 1  . insulin glargine, 2 Unit Dial, (TOUJEO MAX SOLOSTAR) 300 UNIT/ML Solostar Pen Inject 30 Units into the skin daily. 9 mL 0  . Insulin Pen Needle (NOVOFINE) 32G X 6 MM MISC 1 Act by Does not apply route daily. 100 each 1  . irbesartan (AVAPRO) 300 MG tablet TAKE 1 TABLET(300 MG) BY MOUTH DAILY 90 tablet 1  . Lancets (ACCU-CHEK SOFT TOUCH) lancets Use to check blood sugars daily Dx E11.9 100 each 3  . metFORMIN (GLUCOPHAGE-XR) 500 MG 24 hr tablet TAKE 2 TABLETS(1000 MG) BY MOUTH DAILY WITH BREAKFAST 180 tablet 0  . metoprolol tartrate (LOPRESSOR) 25 MG tablet Take 1 tablet every 8 hours AS NEEDED for rapid heart rate 30 tablet 1  . Multiple Vitamins-Minerals (MULTIPLE VITAMINS/WOMENS PO) Take by mouth daily.    [name] omega-3 acid ethyl esters (LOVAZA) 1 g capsule Take 2 capsules (2 g total) by mouth 2 (two) times daily. 360 capsule 1  . PREBIOTIC PRODUCT PO Take 1 capsule by mouth daily.    . rosuvastatin (CRESTOR) 5 MG tablet TAKE 1 TABLET(5 MG) BY MOUTH DAILY 90 tablet 1  . sulfamethoxazole-trimethoprim (BACTRIM DS) 800-160 MG tablet Take 1 tablet by mouth 2 (two) times daily. 10 tablet 0  . torsemide (DEMADEX) 20 MG tablet Take 1 tablet (20 mg total) by mouth every other day. (Patient taking differently: Take 20 mg by mouth every other day. 09/05/2019 changed to 78m qd)    . Turmeric 500 MG  CAPS Take 1,000 mg by mouth daily.     No current facility-administered medications for this visit.    Allergies: Patient has no known allergies.  Past Medical History:  Diagnosis Date  . A-fib ([place])   . Adenocarcinoma of right lung, stage 3 ([place]) 07/28/2016  . Atrial flutter ([place]) 10/05/2016  . Bronchitis    hx of  . Cancer [facility]) 2004   uterine/cervical  . Cancer-related pain 10/06/2016  . Coronary artery disease 02/21/2017   JAN 2019 Prox RCA lesion is 25% stenosed. Prox Cx lesion is 30% stenosed.  Mid Cx lesion is 25% stenosed. Ost 3rd Mrg lesion is 30% stenosed. Prox LAD lesion is 25% stenosed. Mid LAD lesion is 25% stenosed. The left ventricular systolic function is normal. LV end diastolic pressure is normal. The left ventricular ejection fraction is 55-65% by visual estimate. There is no aortic valve stenosis.   N  . Diabetes mellitus    type 2  . Edema 07/12/2017  . Essential hypertension 01/20/2009  . Gallstones   . Headache   . History of radiation therapy 08/09/2016 to 09/19/2016   Right lung was treated to 60 Gy in 30 fractions at 2 Gy per fraction  . Hypertension   . Hypertriglyceridemia 08/22/2012  . Low back pain   . Lung mass    with lymphadenopathy  . Osteoarthritis   . Type II diabetes mellitus with manifestations ([place]) 01/20/2009   Estimated Creatinine Clearance: 49.8 mL/min (by C-G formula based on SCr of 1 mg/dL).    Past Surgical History:  Procedure Laterality Date  . ABDOMINAL HYSTERECTOMY  2004  . APPENDECTOMY  when 79 years old  . COLONOSCOPY  08-05-09   [name]  . I & D EXTREMITY Right 04/04/2019   Procedure: IRRIGATION AND DEBRIDEMENT RIGHT HAND;  Surgeon: Verner Mould, MD;  Location: [facility];  Service: Orthopedics;  Laterality: Right;  . INCONTINENCE SURGERY    . KNEE ARTHROSCOPY Left   . LEFT HEART CATH AND CORONARY ANGIOGRAPHY N/A 02/24/2017   Procedure: LEFT HEART CATH AND CORONARY ANGIOGRAPHY;  Surgeon: Jettie Booze, MD;  Location: [place] [facility];  Service: Cardiovascular;  Laterality: N/A;  . LEFT HEART CATHETERIZATION WITH CORONARY ANGIOGRAM N/A 07/19/2013   Procedure: LEFT HEART CATHETERIZATION WITH CORONARY ANGIOGRAM;  Surgeon: Sinclair Grooms, MD;  Location: [facility] CATH LAB;  Service: Cardiovascular;  Laterality: N/A;  . POLYPECTOMY  08-05-09   2 polyps  . TOTAL KNEE ARTHROPLASTY Left 03/28/2012   Procedure: LEFT TOTAL KNEE ARTHROPLASTY;  Surgeon: Tobi Bastos, MD;  Location: [facility];  Service: Orthopedics;  Laterality: Left;  .  TUBAL LIGATION    . VIDEO BRONCHOSCOPY WITH ENDOBRONCHIAL ULTRASOUND N/A 07/11/2016   Procedure: VIDEO BRONCHOSCOPY WITH ENDOBRONCHIAL ULTRASOUND;  Surgeon: Marshell Garfinkel, MD;  Location: [place];  Service: Pulmonary;  Laterality: N/A;    Family History  Problem Relation Age of Onset  . Heart attack Father   . Heart disease Father   . Arthritis Other   . Cancer Other        colon, lst degree relative  . Diabetes Other        st degree relative  . Hyperlipidemia Other   . Hypertension Other   . Colon cancer Neg Hx     Social History   Tobacco Use  . Smoking status: Never Smoker  . Smokeless tobacco: Never Used  Substance Use Topics  . Alcohol use: No    Alcohol/week: 0.0 standard  drinks    Subjective:   I connected with [name] on 10/18/19 at  8:40 AM EDT by a video enabled telemedicine application and verified that I am speaking with the correct person using two identifiers.   I discussed the limitations of evaluation and management by telemedicine and the availability of in person appointments. The patient expressed understanding and agreed to proceed. Provider in office/ patient is at home; provider and patient are only 2 people on video call.   Daughter in law is present for visit; COVID test is pending- son tested positive on 9/20 but patient is asymptomatic;  Concerned about worsening hemorrhoids; has had wound specialist come to the home who recommended that she be referred to a specialist;   Also questioning about results on recent X-ray done on lumbar spine; it looks like patient was notified that she will need a CT to rule out compression fracture; per daughter, they were not aware of this information.       Objective:  There were no vitals filed for this visit.  General: Well developed, well nourished, in no acute distress  Head: Normocephalic and atraumatic  Lungs: Respirations unlabored;  Neurologic: Alert and oriented; speech intact; face symmetrical;    Assessment:  1. Hemorrhoids, unspecified hemorrhoid type   2. Chronic low back pain, unspecified back pain laterality, unspecified whether sciatica present     Plan:  1. Refer to GI for further evaluation; Rx for Anusol HC and Bactrim DS bid x 5 days; 2. Reviewed sports medicine recommendation for CT to rule out compression fracture; explained that this test has been ordered and they should be contacted soon about scheduling;  COVID test is pending- daughter in law understands to let home health agency know as soon as results are available; explained that home health will be able to come out and start services as soon as status of her COVID test is known. Daughter in law agrees;  Time spent 30 minutes  No follow-ups on file.  Orders Placed This Encounter  Procedures  . Ambulatory referral to Gastroenterology    Referral Priority:   Routine    Referral Type:   Consultation    Referral Reason:   Specialty Services Required    Number of Visits Requested:   1    Requested Prescriptions   Signed Prescriptions Disp Refills  . hydrocortisone (ANUSOL-HC) 2.5 % rectal cream 30 g 1    Sig: Place 1 application rectally 3 (three) times daily. Use for 10 days.  [name] sulfamethoxazole-trimethoprim (BACTRIM DS) 800-160 MG tablet 10 tablet 0    Sig: Take 1 tablet by mouth 2 (two) times daily.

## 2019-10-18 NOTE — Telephone Encounter (Signed)
Pt had virtual visit with Jodi Mourning, FNP today who states that she went over Dr Quay Burow recommendations with patient during call.  Closing this encounter.

## 2019-10-21 ENCOUNTER — Other Ambulatory Visit: Payer: Self-pay

## 2019-10-21 ENCOUNTER — Inpatient Hospital Stay: Payer: Medicare Other | Attending: Internal Medicine

## 2019-10-21 ENCOUNTER — Encounter: Payer: Self-pay | Admitting: Internal Medicine

## 2019-10-21 ENCOUNTER — Telehealth: Payer: Self-pay | Admitting: *Deleted

## 2019-10-21 ENCOUNTER — Telehealth: Payer: Self-pay | Admitting: Internal Medicine

## 2019-10-21 DIAGNOSIS — C3431 Malignant neoplasm of lower lobe, right bronchus or lung: Secondary | ICD-10-CM | POA: Insufficient documentation

## 2019-10-21 DIAGNOSIS — I2510 Atherosclerotic heart disease of native coronary artery without angina pectoris: Secondary | ICD-10-CM | POA: Diagnosis not present

## 2019-10-21 DIAGNOSIS — Z923 Personal history of irradiation: Secondary | ICD-10-CM | POA: Diagnosis not present

## 2019-10-21 DIAGNOSIS — I10 Essential (primary) hypertension: Secondary | ICD-10-CM | POA: Insufficient documentation

## 2019-10-21 DIAGNOSIS — Z794 Long term (current) use of insulin: Secondary | ICD-10-CM | POA: Insufficient documentation

## 2019-10-21 DIAGNOSIS — Z9221 Personal history of antineoplastic chemotherapy: Secondary | ICD-10-CM | POA: Diagnosis not present

## 2019-10-21 DIAGNOSIS — Z79899 Other long term (current) drug therapy: Secondary | ICD-10-CM | POA: Insufficient documentation

## 2019-10-21 DIAGNOSIS — E781 Pure hyperglyceridemia: Secondary | ICD-10-CM | POA: Diagnosis not present

## 2019-10-21 DIAGNOSIS — C3490 Malignant neoplasm of unspecified part of unspecified bronchus or lung: Secondary | ICD-10-CM

## 2019-10-21 DIAGNOSIS — E119 Type 2 diabetes mellitus without complications: Secondary | ICD-10-CM | POA: Insufficient documentation

## 2019-10-21 DIAGNOSIS — I4891 Unspecified atrial fibrillation: Secondary | ICD-10-CM | POA: Insufficient documentation

## 2019-10-21 LAB — CMP (CANCER CENTER ONLY)
ALT: 26 U/L (ref 0–44)
AST: 13 U/L — ABNORMAL LOW (ref 15–41)
Albumin: 2.6 g/dL — ABNORMAL LOW (ref 3.5–5.0)
Alkaline Phosphatase: 117 U/L (ref 38–126)
Anion gap: 5 (ref 5–15)
BUN: 23 mg/dL (ref 8–23)
CO2: 25 mmol/L (ref 22–32)
Calcium: 8.6 mg/dL — ABNORMAL LOW (ref 8.9–10.3)
Chloride: 103 mmol/L (ref 98–111)
Creatinine: 1.01 mg/dL — ABNORMAL HIGH (ref 0.44–1.00)
GFR, Est AFR Am: >60 mL/min (ref >60)
GFR, Estimated: 54 mL/min — ABNORMAL LOW (ref >60)
Glucose, Bld: 158 mg/dL — ABNORMAL HIGH (ref 70–99)
Potassium: 4.4 mmol/L (ref 3.5–5.1)
Sodium: 133 mmol/L — ABNORMAL LOW (ref 135–145)
Total Bilirubin: 0.6 mg/dL (ref 0.3–1.2)
Total Protein: 6 g/dL — ABNORMAL LOW (ref 6.5–8.1)

## 2019-10-21 LAB — CBC WITH DIFFERENTIAL (CANCER CENTER ONLY)
Abs Immature Granulocytes: 0.15 10*3/uL — ABNORMAL HIGH (ref 0.00–0.07)
Basophils Absolute: 0 10*3/uL (ref 0.0–0.1)
Basophils Relative: 0 %
Eosinophils Absolute: 0 10*3/uL (ref 0.0–0.5)
Eosinophils Relative: 0 %
HCT: 40.6 % (ref 36.0–46.0)
Hemoglobin: 13.3 g/dL (ref 12.0–15.0)
Immature Granulocytes: 2 %
Lymphocytes Relative: 5 %
Lymphs Abs: 0.5 10*3/uL — ABNORMAL LOW (ref 0.7–4.0)
MCH: 31 pg (ref 26.0–34.0)
MCHC: 32.8 g/dL (ref 30.0–36.0)
MCV: 94.6 fL (ref 80.0–100.0)
Monocytes Absolute: 0.5 10*3/uL (ref 0.1–1.0)
Monocytes Relative: 5 %
Neutro Abs: 8 10*3/uL — ABNORMAL HIGH (ref 1.7–7.7)
Neutrophils Relative %: 88 %
Platelet Count: 233 10*3/uL (ref 150–400)
RBC: 4.29 MIL/uL (ref 3.87–5.11)
RDW: 15.2 % (ref 11.5–15.5)
WBC Count: 9.1 10*3/uL (ref 4.0–10.5)
nRBC: 0 % (ref 0.0–0.2)

## 2019-10-21 NOTE — Telephone Encounter (Signed)
Patient's covid test was negative  Please refer patient to GI and CT scan  Also, patient wanted Dr. Ronnald Ramp to know that she stopped taking her water pill two or three days ago, however, she states that her feet are no longer swelling, but her veins are popping out on her feet and she is concerned about that  Please follow-up with this patient's daughter, [name] [phone]

## 2019-10-21 NOTE — Telephone Encounter (Signed)
I have spoken to pt's daughter. She stated that pt was seen by Jodi Mourning, NP and a referral was placed for GI for Hemorids. She stated the CT was requested from Dr. Georgina Snell in Sports Medicine but both were on hold until the patients COVID test came back negative.

## 2019-10-21 NOTE — Telephone Encounter (Signed)
Reviewed patients chart.  She was exposed to son who tested positive on 10/14/2019.  She does not live with him and lives alone.  She tested negative and will bring test results to when she arrives tomorrow for visit.

## 2019-10-22 ENCOUNTER — Inpatient Hospital Stay: Payer: Medicare Other | Admitting: Internal Medicine

## 2019-10-22 ENCOUNTER — Telehealth: Payer: Self-pay | Admitting: Internal Medicine

## 2019-10-22 NOTE — Telephone Encounter (Signed)
CT lumbar spine scheduled for tomorrow

## 2019-10-22 NOTE — Telephone Encounter (Signed)
R/s todays appt due to provider being on call. Called and spoke with patients daughter in law. Confirmed new appt

## 2019-10-23 ENCOUNTER — Telehealth: Payer: Self-pay | Admitting: Internal Medicine

## 2019-10-23 ENCOUNTER — Other Ambulatory Visit: Payer: Self-pay

## 2019-10-23 ENCOUNTER — Encounter: Payer: Self-pay | Admitting: Internal Medicine

## 2019-10-23 ENCOUNTER — Other Ambulatory Visit: Payer: Medicare Other

## 2019-10-23 ENCOUNTER — Inpatient Hospital Stay (HOSPITAL_BASED_OUTPATIENT_CLINIC_OR_DEPARTMENT_OTHER): Payer: Medicare Other | Admitting: Internal Medicine

## 2019-10-23 VITALS — BP 131/98 | HR 83 | Temp 96.6°F | Resp 20 | Ht 68.0 in

## 2019-10-23 DIAGNOSIS — Z79899 Other long term (current) drug therapy: Secondary | ICD-10-CM | POA: Diagnosis not present

## 2019-10-23 DIAGNOSIS — Z9221 Personal history of antineoplastic chemotherapy: Secondary | ICD-10-CM | POA: Diagnosis not present

## 2019-10-23 DIAGNOSIS — I2510 Atherosclerotic heart disease of native coronary artery without angina pectoris: Secondary | ICD-10-CM | POA: Diagnosis not present

## 2019-10-23 DIAGNOSIS — I4891 Unspecified atrial fibrillation: Secondary | ICD-10-CM | POA: Diagnosis not present

## 2019-10-23 DIAGNOSIS — C3490 Malignant neoplasm of unspecified part of unspecified bronchus or lung: Secondary | ICD-10-CM | POA: Diagnosis not present

## 2019-10-23 DIAGNOSIS — Z923 Personal history of irradiation: Secondary | ICD-10-CM | POA: Diagnosis not present

## 2019-10-23 DIAGNOSIS — E119 Type 2 diabetes mellitus without complications: Secondary | ICD-10-CM | POA: Diagnosis not present

## 2019-10-23 DIAGNOSIS — C3431 Malignant neoplasm of lower lobe, right bronchus or lung: Secondary | ICD-10-CM | POA: Diagnosis not present

## 2019-10-23 DIAGNOSIS — C3491 Malignant neoplasm of unspecified part of right bronchus or lung: Secondary | ICD-10-CM | POA: Diagnosis not present

## 2019-10-23 DIAGNOSIS — E781 Pure hyperglyceridemia: Secondary | ICD-10-CM | POA: Diagnosis not present

## 2019-10-23 DIAGNOSIS — I10 Essential (primary) hypertension: Secondary | ICD-10-CM

## 2019-10-23 DIAGNOSIS — Z794 Long term (current) use of insulin: Secondary | ICD-10-CM | POA: Diagnosis not present

## 2019-10-23 NOTE — Telephone Encounter (Signed)
Scheduled per 9/29 los. No avs or calendar needed to be printed. Pt is aware of appt times and dates. Referral sent to [facility].

## 2019-10-23 NOTE — Telephone Encounter (Signed)
It has been re-faxed

## 2019-10-23 NOTE — Progress Notes (Signed)
[place] [phone]   Fax:([phone]  OFFICE PROGRESS NOTE  Nicole Lima, MD [place] [id number]  DIAGNOSIS: Stage IIIa (T1b, N2, M0) non-small cell lung cancer, adenocarcinoma presented with right lower lobe lung mass in addition to right hilar and subcarinal lymphadenopathy diagnosed in June 2018.  Biomarker Findings Tumor Mutational Burden - TMB-Intermediate (8 Muts/Mb) Microsatellite Status - Cannot Be Determined Genomic Findings For a complete list of the genes assayed, please refer to the Appendix. EGFR E709K, G719S OMV6HM C947S JGG8Z6 splice site 629-4T>M 7 Disease relevant genes with no reportable alterations: KRAS, ALK, BRAF, MET, ERBB2, RET, ROS1  PDL1 Expression 70%.  PRIOR THERAPY: 1) Concurrent chemoradiation with weekly carboplatin for AUC of 2 and paclitaxel 45 MG/M2. Status post 7 cycles with partial response. 2) Consolidation treatment with immunotherapy with Imfinzi (Durvalumab) 10 MG/KG every 2 weeks, first dose 10/19/2016.  Status post 5 cycles.  Discontinued secondary to intolerance and persistent pneumonitis.  CURRENT THERAPY: Observation.  INTERVAL HISTORY: Nicole [name] 79 y.o. female returns to the clinic today for follow-up visit accompanied by her daughter-in-law.  The patient has a lot of complaints today including increasing fatigue and weakness as well as bruises and ecchymosis from her previous treatment with Eliquis.  She discontinued her medication after she saw her cardiologist and they told her that she is in a sinus rhythm.  She also has pain on high-dose of furosemide and the patient has concern about the frequency of urination and she discontinued it also on her own.  She denied having any current chest pain but has shortness of breath at baseline increased with exertion with no cough or hemoptysis.  She denied having any fever or chills.  She has no nausea, vomiting, diarrhea or  constipation.  She continues to have swelling of the lower extremities.   MEDICAL HISTORY: Past Medical History:  Diagnosis Date  . A-fib ([place])   . Adenocarcinoma of right lung, stage 3 ([place]) 07/28/2016  . Atrial flutter ([place]) 10/05/2016  . Bronchitis    hx of  . Cancer [facility]) 2004   uterine/cervical  . Cancer-related pain 10/06/2016  . Coronary artery disease 02/21/2017   JAN 2019 Prox RCA lesion is 25% stenosed. Prox Cx lesion is 30% stenosed. Mid Cx lesion is 25% stenosed. Ost 3rd Mrg lesion is 30% stenosed. Prox LAD lesion is 25% stenosed. Mid LAD lesion is 25% stenosed. The left ventricular systolic function is normal. LV end diastolic pressure is normal. The left ventricular ejection fraction is 55-65% by visual estimate. There is no aortic valve stenosis.   N  . Diabetes mellitus    type 2  . Edema 07/12/2017  . Essential hypertension 01/20/2009  . Gallstones   . Headache   . History of radiation therapy 08/09/2016 to 09/19/2016   Right lung was treated to 60 Gy in 30 fractions at 2 Gy per fraction  . Hypertension   . Hypertriglyceridemia 08/22/2012  . Low back pain   . Lung mass    with lymphadenopathy  . Osteoarthritis   . Type II diabetes mellitus with manifestations ([place]) 01/20/2009   Estimated Creatinine Clearance: 49.8 mL/min (by C-G formula based on SCr of 1 mg/dL).    ALLERGIES:  has No Known Allergies.  MEDICATIONS:  Current Outpatient Medications  Medication Sig Dispense Refill  . b complex vitamins tablet Take 1 tablet by mouth daily.    . calcium-vitamin D (OSCAL WITH D)  500-200 MG-UNIT tablet Take 2 tablets by mouth daily with breakfast.    . Collagenase POWD by Does not apply route daily.     . CONTOUR NEXT TEST test strip USE 1 STRIP TO CHECK GLUCOSE TWICE DAILY 200 each 2  . diltiazem (CARDIZEM CD) 360 MG 24 hr capsule TAKE 1 CAPSULE(360 MG) BY MOUTH DAILY 90 capsule 2  . glucose blood test strip Contour Next Test Strips  USE 1 STRIP TO CHECK GLUCOSE TWICE  DAILY    . hydrocortisone (ANUSOL-HC) 2.5 % rectal cream Place 1 application rectally 3 (three) times daily. Use for 10 days. 30 g 1  . insulin glargine, 2 Unit Dial, (TOUJEO MAX SOLOSTAR) 300 UNIT/ML Solostar Pen Inject 30 Units into the skin daily. 9 mL 0  . Insulin Pen Needle (NOVOFINE) 32G X 6 MM MISC 1 Act by Does not apply route daily. 100 each 1  . irbesartan (AVAPRO) 300 MG tablet TAKE 1 TABLET(300 MG) BY MOUTH DAILY 90 tablet 1  . Lancets (ACCU-CHEK SOFT TOUCH) lancets Use to check blood sugars daily Dx E11.9 100 each 3  . metFORMIN (GLUCOPHAGE-XR) 500 MG 24 hr tablet TAKE 2 TABLETS(1000 MG) BY MOUTH DAILY WITH BREAKFAST 180 tablet 0  . metoprolol tartrate (LOPRESSOR) 25 MG tablet Take 1 tablet every 8 hours AS NEEDED for rapid heart rate 30 tablet 1  . Multiple Vitamins-Minerals (MULTIPLE VITAMINS/WOMENS PO) Take by mouth daily.    [name] omega-3 acid ethyl esters (LOVAZA) 1 g capsule Take 2 capsules (2 g total) by mouth 2 (two) times daily. 360 capsule 1  . PREBIOTIC PRODUCT PO Take 1 capsule by mouth daily.    . rosuvastatin (CRESTOR) 5 MG tablet TAKE 1 TABLET(5 MG) BY MOUTH DAILY 90 tablet 1  . sulfamethoxazole-trimethoprim (BACTRIM DS) 800-160 MG tablet Take 1 tablet by mouth 2 (two) times daily. 10 tablet 0  . torsemide (DEMADEX) 20 MG tablet Take 1 tablet (20 mg total) by mouth every other day. (Patient taking differently: Take 20 mg by mouth every other day. 09/05/2019 changed to 72m qd)    . Turmeric 500 MG CAPS Take 1,000 mg by mouth daily.     No current facility-administered medications for this visit.    SURGICAL HISTORY:  Past Surgical History:  Procedure Laterality Date  . ABDOMINAL HYSTERECTOMY  2004  . APPENDECTOMY  when 79years old  . COLONOSCOPY  08-05-09   PSharlett Iles . I & D EXTREMITY Right 04/04/2019   Procedure: IRRIGATION AND DEBRIDEMENT RIGHT HAND;  Surgeon: CVerner Mould MD;  Location: [facility];  Service: Orthopedics;  Laterality: Right;  . INCONTINENCE  SURGERY    . KNEE ARTHROSCOPY Left   . LEFT HEART CATH AND CORONARY ANGIOGRAPHY N/A 02/24/2017   Procedure: LEFT HEART CATH AND CORONARY ANGIOGRAPHY;  Surgeon: VJettie Booze MD;  [facility];  Service: Cardiovascular;  Laterality: N/A;  . LEFT HEART CATHETERIZATION WITH CORONARY ANGIOGRAM N/A 07/19/2013   Procedure: LEFT HEART CATHETERIZATION WITH CORONARY ANGIOGRAM;  Surgeon: HSinclair Grooms MD;  [facility];  Service: Cardiovascular;  Laterality: N/A;  . POLYPECTOMY  08-05-09   2 polyps  . TOTAL KNEE ARTHROPLASTY Left 03/28/2012   Procedure: LEFT TOTAL KNEE ARTHROPLASTY;  Surgeon: RTobi Bastos MD;  Location: [facility];  Service: Orthopedics;  Laterality: Left;  . TUBAL LIGATION    . VIDEO BRONCHOSCOPY WITH ENDOBRONCHIAL ULTRASOUND N/A 07/11/2016   Procedure: VIDEO BRONCHOSCOPY WITH ENDOBRONCHIAL ULTRASOUND;  Surgeon:  Marshell Garfinkel, MD;  Location: [facility];  Service: Pulmonary;  Laterality: N/A;    REVIEW OF SYSTEMS:  Constitutional: positive for fatigue Eyes: negative Ears, nose, mouth, throat, and face: negative Respiratory: positive for dyspnea on exertion Cardiovascular: negative Gastrointestinal: negative Genitourinary:negative Integument/breast: negative Hematologic/lymphatic: negative Musculoskeletal:negative Neurological: negative Behavioral/Psych: negative Endocrine: negative Allergic/Immunologic: negative   PHYSICAL EXAMINATION: General appearance: alert, cooperative, fatigued and no distress Head: Normocephalic, without obvious abnormality, atraumatic Neck: no adenopathy, no JVD, supple, symmetrical, trachea midline and thyroid not enlarged, symmetric, no tenderness/mass/nodules Lymph nodes: Cervical, supraclavicular, and axillary nodes normal. Resp: clear to auscultation bilaterally Back: symmetric, no curvature. ROM normal. No CVA tenderness. Cardio: regular rate and rhythm, S1, S2 normal, no murmur, click, rub or gallop GI: soft,  non-tender; bowel sounds normal; no masses,  no organomegaly Extremities: extremities normal, atraumatic, no cyanosis or edema Neurologic: Alert and oriented X 3, normal strength and tone. Normal symmetric reflexes. Normal coordination and gait  ECOG PERFORMANCE STATUS: 1 - Symptomatic but completely ambulatory  Blood pressure (!) 131/98, pulse 83, temperature (!) 96.6 F (35.9 C), resp. rate 20, height 5' 8" (1.727 m), SpO2 100 %.  LABORATORY DATA: Lab Results  Component Value Date   WBC 9.1 10/21/2019   HGB 13.3 10/21/2019   HCT 40.6 10/21/2019   MCV 94.6 10/21/2019   PLT 233 10/21/2019      Chemistry      Component Value Date/Time   NA 133 (L) 10/21/2019 0940   NA 140 02/21/2017 1238   NA 139 01/26/2017 0915   K 4.4 10/21/2019 0940   K 4.1 01/26/2017 0915   CL 103 10/21/2019 0940   CO2 25 10/21/2019 0940   CO2 24 01/26/2017 0915   BUN 23 10/21/2019 0940   BUN 31 (H) 02/21/2017 1238   BUN 22.0 01/26/2017 0915   CREATININE 1.01 (H) 10/21/2019 0940   CREATININE 0.9 01/26/2017 0915      Component Value Date/Time   CALCIUM 8.6 (L) 10/21/2019 0940   CALCIUM 9.2 01/26/2017 0915   ALKPHOS 117 10/21/2019 0940   ALKPHOS 72 01/26/2017 0915   AST 13 (L) 10/21/2019 0940   AST 13 01/26/2017 0915   ALT 26 10/21/2019 0940   ALT 12 01/26/2017 0915   BILITOT 0.6 10/21/2019 0940   BILITOT 0.37 01/26/2017 0915       RADIOGRAPHIC STUDIES: DG Lumbar Spine 2-3 Views  Result Date: 10/12/2019 CLINICAL DATA:  Chronic low back pain for 6 months EXAM: LUMBAR SPINE - 2-3 VIEW COMPARISON:  08/01/2019 FINDINGS: Frontal and lateral views of the lumbar spine demonstrate 5 non-rib-bearing lumbar type vertebral bodies in stable alignment. Mild left convex curvature again noted centered at L4. Stable mild grade 1 anterolisthesis of L4 on L5. Since the previous exam, compression deformity has developed within the superior endplate of the L2 vertebral body, with less than 50% loss of height. No  other acute bony abnormalities. Stable spondylosis at T11/T12, T12/L1, L3/L4, L4/L5, and L5/S1. IMPRESSION: 1. Interval development of a compression deformity involving the superior endplate of L2, with less than 50% loss of height. 2. Stable multilevel spondylosis and left convex scoliosis. These results will be called to the ordering clinician or representative by the Radiologist Assistant, and communication documented in the PACS or [employer]. Electronically Signed   By: Randa Ngo M.D.   On: 10/12/2019 03:45   DG Knee 1-2 Views Left  Result Date: 10/14/2019 CLINICAL DATA:  Generalized left knee pain x 6 mo/ EXAM: LEFT KNEE -  1-2 VIEW COMPARISON:  Left knee radiographs 03/28/2012 FINDINGS: Status post total left knee arthroplasty. There is no evidence of acute fracture or dislocation. No joint effusion. Joint spaces are maintained. No focal bone lesion. Vascular calcifications are noted in the regional soft tissues. IMPRESSION: Status post left knee arthroplasty. No acute osseous abnormality. Electronically Signed   By: Audie Pinto M.D.   On: 10/14/2019 09:23    ASSESSMENT AND PLAN: This is a very pleasant 79 years old Asian female recently diagnosed with a stage IIIa non-small cell4 lung cancer, adenocarcinoma. She underwent a course of concurrent chemoradiation with weekly carboplatin and paclitaxel status post 7 cycles. The patient tolerated the previous course fairly well except for mild odynophagia and dry cough. The patient was a started on consolidation treatment with immunotherapy with Imfinzi (Durvalumab) status post 5 cycles.  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 She was treated with a taper dose of prednisone and she felt much better. The patient is currently on observation and she is feeling much better except for the increasing fatigue and weakness as well as the swelling of the lower extremities and ecchymosis from her previous treatment with  Eliquis. I recommended for the patient to have repeat CT scan of the chest performed next week for evaluation of the suspicious pulmonary nodule seen on the previous scan. For the swelling of the lower extremities as well as the varicose veins, I will refer the patient to vascular surgery for evaluation. Regarding the atrial fibrillation, she is followed by cardiology. I will see her back for follow-up visit in 6 months for evaluation with repeat CT scan of the chest for restaging of her disease if the upcoming scan is negative for disease progression. She was advised to call immediately if she has any concerning symptoms in the interval. The patient voices understanding of current disease status and treatment options and is in agreement with the current care plan. All questions were answered. The patient knows to call the clinic with any problems, questions or concerns. We can certainly see the patient much sooner if necessary.  Disclaimer: This note was dictated with voice recognition software. Similar sounding words can inadvertently be transcribed and may not be corrected upon review.

## 2019-10-23 NOTE — Telephone Encounter (Signed)
Fax [phone] Please re-fax the standing written order, bottom not showing.Marland KitchenMarland KitchenMarland Kitchen

## 2019-10-24 ENCOUNTER — Ambulatory Visit (INDEPENDENT_AMBULATORY_CARE_PROVIDER_SITE_OTHER): Payer: Medicare Other

## 2019-10-24 ENCOUNTER — Encounter: Payer: Self-pay | Admitting: Family Medicine

## 2019-10-24 DIAGNOSIS — S32050A Wedge compression fracture of fifth lumbar vertebra, initial encounter for closed fracture: Secondary | ICD-10-CM

## 2019-10-24 DIAGNOSIS — S32040A Wedge compression fracture of fourth lumbar vertebra, initial encounter for closed fracture: Secondary | ICD-10-CM

## 2019-10-24 DIAGNOSIS — S32020A Wedge compression fracture of second lumbar vertebra, initial encounter for closed fracture: Secondary | ICD-10-CM | POA: Diagnosis not present

## 2019-10-24 DIAGNOSIS — M4807 Spinal stenosis, lumbosacral region: Secondary | ICD-10-CM | POA: Diagnosis not present

## 2019-10-24 DIAGNOSIS — M47816 Spondylosis without myelopathy or radiculopathy, lumbar region: Secondary | ICD-10-CM | POA: Diagnosis not present

## 2019-10-24 DIAGNOSIS — M48061 Spinal stenosis, lumbar region without neurogenic claudication: Secondary | ICD-10-CM | POA: Diagnosis not present

## 2019-10-25 ENCOUNTER — Encounter: Payer: Self-pay | Admitting: Family Medicine

## 2019-10-25 ENCOUNTER — Other Ambulatory Visit: Payer: Self-pay

## 2019-10-25 ENCOUNTER — Encounter (HOSPITAL_COMMUNITY): Payer: Self-pay

## 2019-10-25 ENCOUNTER — Emergency Department (HOSPITAL_COMMUNITY)
Admission: EM | Admit: 2019-10-25 | Discharge: 2019-10-26 | Disposition: A | Discharge status: Nursing Home (Includes ICF) | Payer: Medicare Other | Attending: Emergency Medicine | Admitting: Emergency Medicine

## 2019-10-25 DIAGNOSIS — I119 Hypertensive heart disease without heart failure: Secondary | ICD-10-CM | POA: Insufficient documentation

## 2019-10-25 DIAGNOSIS — S32020A Wedge compression fracture of second lumbar vertebra, initial encounter for closed fracture: Secondary | ICD-10-CM | POA: Diagnosis not present

## 2019-10-25 DIAGNOSIS — Z85118 Personal history of other malignant neoplasm of bronchus and lung: Secondary | ICD-10-CM | POA: Insufficient documentation

## 2019-10-25 DIAGNOSIS — Y92009 Unspecified place in unspecified non-institutional (private) residence as the place of occurrence of the external cause: Secondary | ICD-10-CM | POA: Insufficient documentation

## 2019-10-25 DIAGNOSIS — Z79899 Other long term (current) drug therapy: Secondary | ICD-10-CM | POA: Diagnosis not present

## 2019-10-25 DIAGNOSIS — R52 Pain, unspecified: Secondary | ICD-10-CM | POA: Diagnosis not present

## 2019-10-25 DIAGNOSIS — M545 Low back pain, unspecified: Secondary | ICD-10-CM | POA: Insufficient documentation

## 2019-10-25 DIAGNOSIS — W19XXXA Unspecified fall, initial encounter: Secondary | ICD-10-CM | POA: Diagnosis not present

## 2019-10-25 DIAGNOSIS — E119 Type 2 diabetes mellitus without complications: Secondary | ICD-10-CM | POA: Insufficient documentation

## 2019-10-25 DIAGNOSIS — Z96652 Presence of left artificial knee joint: Secondary | ICD-10-CM | POA: Insufficient documentation

## 2019-10-25 DIAGNOSIS — Z7901 Long term (current) use of anticoagulants: Secondary | ICD-10-CM | POA: Diagnosis not present

## 2019-10-25 DIAGNOSIS — M5489 Other dorsalgia: Secondary | ICD-10-CM | POA: Diagnosis not present

## 2019-10-25 DIAGNOSIS — Z794 Long term (current) use of insulin: Secondary | ICD-10-CM | POA: Diagnosis not present

## 2019-10-25 LAB — CBC WITH DIFFERENTIAL/PLATELET
Abs Immature Granulocytes: 0.14 10*3/uL — ABNORMAL HIGH (ref 0.00–0.07)
Basophils Absolute: 0 10*3/uL (ref 0.0–0.1)
Basophils Relative: 1 %
Eosinophils Absolute: 0 10*3/uL (ref 0.0–0.5)
Eosinophils Relative: 1 %
HCT: 41.7 % (ref 36.0–46.0)
Hemoglobin: 13.6 g/dL (ref 12.0–15.0)
Immature Granulocytes: 2 %
Lymphocytes Relative: 11 %
Lymphs Abs: 0.8 10*3/uL (ref 0.7–4.0)
MCH: 30.9 pg (ref 26.0–34.0)
MCHC: 32.6 g/dL (ref 30.0–36.0)
MCV: 94.8 fL (ref 80.0–100.0)
Monocytes Absolute: 0.7 10*3/uL (ref 0.1–1.0)
Monocytes Relative: 9 %
Neutro Abs: 5.7 10*3/uL (ref 1.7–7.7)
Neutrophils Relative %: 76 %
Platelets: 249 10*3/uL (ref 150–400)
RBC: 4.4 MIL/uL (ref 3.87–5.11)
RDW: 14.8 % (ref 11.5–15.5)
WBC: 7.4 10*3/uL (ref 4.0–10.5)
nRBC: 0 % (ref 0.0–0.2)

## 2019-10-25 LAB — BASIC METABOLIC PANEL
Anion gap: 10 (ref 5–15)
BUN: 26 mg/dL — ABNORMAL HIGH (ref 8–23)
CO2: 23 mmol/L (ref 22–32)
Calcium: 8.6 mg/dL — ABNORMAL LOW (ref 8.9–10.3)
Chloride: 101 mmol/L (ref 98–111)
Creatinine, Ser: 0.7 mg/dL (ref 0.44–1.00)
GFR calc Af Amer: >60 mL/min (ref >60)
GFR calc non Af Amer: >60 mL/min (ref >60)
Glucose, Bld: 85 mg/dL (ref 70–99)
Potassium: 3.5 mmol/L (ref 3.5–5.1)
Sodium: 134 mmol/L — ABNORMAL LOW (ref 135–145)

## 2019-10-25 MED ORDER — DILTIAZEM HCL ER COATED BEADS 180 MG PO CP24
360.0000 mg | ORAL_CAPSULE | Freq: Every day | ORAL | Status: DC
  Administered 2019-10-25 – 2019-10-26 (×2): 360 mg via ORAL
  Filled 2019-10-25 (×2): qty 2

## 2019-10-25 MED ORDER — INSULIN GLARGINE 100 UNIT/ML ~~LOC~~ SOLN
30.0000 [IU] | Freq: Every day | SUBCUTANEOUS | Status: DC
  Administered 2019-10-25: 30 [IU] via SUBCUTANEOUS
  Filled 2019-10-25: qty 0.3

## 2019-10-25 MED ORDER — SULFAMETHOXAZOLE-TRIMETHOPRIM 800-160 MG PO TABS
1.0000 | ORAL_TABLET | Freq: Two times a day (BID) | ORAL | Status: DC
  Administered 2019-10-25 – 2019-10-26 (×2): 1 via ORAL
  Filled 2019-10-25 (×3): qty 1

## 2019-10-25 MED ORDER — METOPROLOL TARTRATE 25 MG PO TABS
25.0000 mg | ORAL_TABLET | Freq: Two times a day (BID) | ORAL | Status: DC | PRN
  Filled 2019-10-25: qty 1

## 2019-10-25 NOTE — ED Notes (Signed)
Pt used bed pan without issue.

## 2019-10-25 NOTE — NC FL2 (Signed)
[place] LEVEL OF CARE SCREENING TOOL     IDENTIFICATION  Patient Name: Nicole [name] Birthdate: Dec 07, 1940 Sex: female Admission Date (Current Location): 10/25/2019  [facility] and [place] Number:  Herbalist and Address:  [facility],  [place], [place]      Provider Number: [id number]  Attending Physician Name and Address:  Quintella Reichert, MD  Relative Name and Phone Number:  Natallia Stellmach [phone]    Current Level of Care: Hospital Recommended Level of Care: [place] Prior Approval Number:    Date Approved/Denied:   PASRR Number: [id number]  Discharge Plan: SNF    Current Diagnoses: Patient Active Problem List   Diagnosis Date Noted  . Persistent atrial fibrillation ([place]) 08/21/2019  . Senile purpura ([place]) 08/03/2019  . Decubitus ulcer of sacral region, stage 1 05/08/2019  . Diastolic dysfunction with chronic heart failure ([place]) 04/30/2019  . Paroxysmal atrial fibrillation ([place]) 02/20/2019  . Secondary hypercoagulable state ([place]) 02/20/2019  . External hemorrhoids 08/16/2018  . Hyperlipidemia with target LDL less than 100 07/18/2018  . Atherosclerosis of aorta ([place]) 07/18/2018  . Spinal stenosis at L4-L5 level 01/25/2018  . Arthritis of sacroiliac joint 01/11/2018  . Coronary artery disease 02/21/2017  . 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 Encounter for antineoplastic immunotherapy 10/12/2016  . Cancer-related pain 10/06/2016  . Atrial flutter ([place]) 10/05/2016  . Adenocarcinoma of right lung, stage 3 ([place]) 07/28/2016  . Encounter for antineoplastic chemotherapy 07/28/2016  . Pancreatic cyst 11/09/2015  . Routine general medical examination at a health care facility 06/30/2014  . Gout 01/18/2013  . Hypertriglyceridemia 08/22/2012  . Osteopenia 10/31/2011  . Type II diabetes mellitus with manifestations ([place]) 01/20/2009  . Essential hypertension 01/20/2009  .  Osteoarthritis 01/20/2009    Orientation RESPIRATION BLADDER Height & Weight     Self, Time, Situation, Place  Normal Continent Weight:   Height:     BEHAVIORAL SYMPTOMS/MOOD NEUROLOGICAL BOWEL NUTRITION STATUS      Continent Diet  AMBULATORY STATUS COMMUNICATION OF NEEDS Skin   Limited Assist Verbally Normal                       Personal Care Assistance Level of Assistance  Bathing, Feeding, Dressing Bathing Assistance: Limited assistance Feeding assistance: Independent Dressing Assistance: Limited assistance     Functional Limitations Info  Sight, Hearing, Speech Sight Info: Adequate Hearing Info: Adequate Speech Info: Adequate    SPECIAL CARE FACTORS FREQUENCY  PT (By licensed PT), OT (By licensed OT)     PT Frequency: 5 times a week OT Frequency: 5 times a week            Contractures Contractures Info: Not present    Additional Factors Info  Code Status, Allergies Code Status Info: Full Code Allergies Info: NKA           Current Medications (10/25/2019):  This is the current hospital active medication list No current facility-administered medications for this encounter.   Current Outpatient Medications  Medication Sig Dispense Refill  . b complex vitamins tablet Take 1 tablet by mouth daily.    . calcium-vitamin D (OSCAL WITH D) 500-200 MG-UNIT tablet Take 2 tablets by mouth daily with breakfast.    . Collagenase POWD by Does not apply route daily.     . CONTOUR NEXT TEST test strip USE 1 STRIP TO CHECK GLUCOSE TWICE DAILY 200 each 2  . diltiazem (CARDIZEM CD) 360  MG 24 hr capsule TAKE 1 CAPSULE(360 MG) BY MOUTH DAILY 90 capsule 2  . ELIQUIS 5 MG TABS tablet Take 5 mg by mouth 2 (two) times daily. (Patient not taking: Reported on 10/23/2019)    . glucose blood test strip Contour Next Test Strips  USE 1 STRIP TO CHECK GLUCOSE TWICE DAILY    . hydrocortisone (ANUSOL-HC) 2.5 % rectal cream Place 1 application rectally 3 (three) times daily. Use for  10 days. (Patient not taking: Reported on 10/23/2019) 30 g 1  . insulin glargine, 2 Unit Dial, (TOUJEO MAX SOLOSTAR) 300 UNIT/ML Solostar Pen Inject 30 Units into the skin daily. 9 mL 0  . Insulin Pen Needle (NOVOFINE) 32G X 6 MM MISC 1 Act by Does not apply route daily. 100 each 1  . irbesartan (AVAPRO) 300 MG tablet TAKE 1 TABLET(300 MG) BY MOUTH DAILY 90 tablet 1  . Lancets (ACCU-CHEK SOFT TOUCH) lancets Use to check blood sugars daily Dx E11.9 100 each 3  . metFORMIN (GLUCOPHAGE-XR) 500 MG 24 hr tablet TAKE 2 TABLETS(1000 MG) BY MOUTH DAILY WITH BREAKFAST 180 tablet 0  . metoprolol tartrate (LOPRESSOR) 25 MG tablet Take 1 tablet every 8 hours AS NEEDED for rapid heart rate 30 tablet 1  . Multiple Vitamins-Minerals (MULTIPLE VITAMINS/WOMENS PO) Take by mouth daily.    [name] omega-3 acid ethyl esters (LOVAZA) 1 g capsule Take 2 capsules (2 g total) by mouth 2 (two) times daily. 360 capsule 1  . PREBIOTIC PRODUCT PO Take 1 capsule by mouth daily.    . rosuvastatin (CRESTOR) 5 MG tablet TAKE 1 TABLET(5 MG) BY MOUTH DAILY 90 tablet 1  . sulfamethoxazole-trimethoprim (BACTRIM DS) 800-160 MG tablet Take 1 tablet by mouth 2 (two) times daily. 10 tablet 0  . torsemide (DEMADEX) 20 MG tablet Take 1 tablet (20 mg total) by mouth every other day. (Patient not taking: Reported on 10/23/2019)    . Turmeric 500 MG CAPS Take 1,000 mg by mouth daily.       Discharge Medications: Please see discharge summary for a list of discharge medications.  Relevant Imaging Results:  Relevant Lab Results:   Additional Information SSN# [id number]  Naftoli Penny C Tarpley-Carter, LCSWA

## 2019-10-25 NOTE — ED Provider Notes (Signed)
Care received from [facility] at shift change.  Please see her note for full HPI.  In short.  79 year old female with history of atrial fibrillation, adenocarcinoma of the right lung status post chemo and radiation on surveillance, DM type II, hypertension, chronic back pain presented with worsening back pain and difficulty walking.  She had presented to Dr. Georgina Snell with [facility] and orthopedic medicine who ordered a CT of the lumbar spine and was sent to have this performed here in the ER.  She underwent a CT of the L-spine which showed a subacute compression fracture with sclerosis and gas along the fracture planes suggesting of chronic disease.  She also had some advanced degenerative changes with severe spinal foraminal stenosis.  Previous provider had spoken with Dr. Georgina Snell.  Labs without significant abnormalities.  He was care pending PT evaluation, transition of care team involved to help with placement.  PT saw and evaluated the patient who recommended further treatment.  Per social work notes, patient was accepted to [facility] and can be transferred tomorrow morning on Saturday.  I did refill her home diltiazem, metoprolol, insulin, Bactrim which the patient was taking for hemorrhoids per her PCP, states she has 3 more days left of the course.  It appears ibesartan may have been discontinued, however the patient and family are unaware if she is taking this medication.  Will hold for now.  Patient remained stable, for transport tomorrow to [facility].   Physical Exam  BP 101/63 (BP Location: Left Arm)   Pulse (!) 103   Temp (!) 97.5 F (36.4 C) (Oral)   Resp 17   SpO2 98%   Physical Exam Vitals and nursing note reviewed.  Constitutional:      General: She is not in acute distress.    Appearance: She is well-developed. She is not ill-appearing, toxic-appearing or diaphoretic.  HENT:     Head: Normocephalic and atraumatic.  Eyes:     Conjunctiva/sclera: Conjunctivae normal.   Cardiovascular:     Rate and Rhythm: Normal rate and regular rhythm.     Pulses: Normal pulses.     Heart sounds: Normal heart sounds. No murmur heard.   Pulmonary:     Effort: Pulmonary effort is normal. No respiratory distress.     Breath sounds: Normal breath sounds.  Abdominal:     General: Abdomen is flat.     Palpations: Abdomen is soft.     Tenderness: There is no abdominal tenderness.  Musculoskeletal:        General: Swelling present. No tenderness or deformity. Normal range of motion.     Cervical back: Neck supple.     Right lower leg: Edema present.     Left lower leg: Edema present.     Comments: No C, T spine midline tenderness, no midline tenderness to palpation to the [facility] without step-offs or crepitus. She has some associated paraspinal muscle tenderness to the LSPINE muscles and right and left glutes. Moving all 4 extremities and able to roll without assistance. Mild nonpitting edema to lower extremities bilaterally. Gross sensations intact   Skin:    General: Skin is warm and dry.     Comments: Pt with hemorrhoids and sacral wound, not inspected as the patient is in the hallway and previous provider documented with photo   Neurological:     Mental Status: She is alert.     ED Course/Procedures   Clinical Course as of Oct 25 1946  Fri Oct 25, 2019  1113 [phone] Spoke to Dr Lynne Leader. Discussed expected POC in ER. Agrees with labs, pain control, CM.  He is considering kyphoplasty or vertebroplasty more as OP with IR.     [CG]  1542 Patient stopped her eliquis over 3 months ago. Stopped diuretics as well because she was having too much urination - terrible pain with standing up so frequently    [CG]  1656 From CT lumbar wo contrast yesterday  "IMPRESSION: 1. 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 Height loss measures at the 55%. No retropulsion. 2. L4 remote compression  fracture. 3. 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 Foraminal impingement and spinal stenosis are also prominent at L3-4 and L5-S1."   [CG]    Clinical Course User Index [CG] Kinnie Feil, PA-C    Procedures  MDM        Lyndel Safe 10/25/19 2002    Truddie Hidden, MD 10/25/19 2004

## 2019-10-25 NOTE — ED Notes (Signed)
Pure wick was placed at 80 mmHg.

## 2019-10-25 NOTE — Telephone Encounter (Signed)
Patient's daughter in law is calling in regards to the paper work for the patient to get a wheel chair. [name] can be reached at [phone]

## 2019-10-25 NOTE — ED Triage Notes (Signed)
Per EMS- patient lives at home by herself. Patient 's family reported to EMS that the patient may have fallen during the night. Patient will not verify this. EMS states that when they arrived the patient was lying half way on the bed and c/o back pain. Patient also c/o hemorrhoid pain as well.

## 2019-10-25 NOTE — ED Notes (Signed)
Called PT office and they will page PT therapist to sign up for this patient ASAP.

## 2019-10-25 NOTE — Progress Notes (Signed)
TOC CSW has spoken with Jennifer/pts daughter.  Pt and family would prefer [facility].  Pt has been there before and is familiar with them.  CSW will continue to follow for dc needs.  Seung Nidiffer Tarpley-Carter, MSW, LCSW-A Pronouns:  She, Her, [place] ED Transitions of CareClinical Social Worker [email or web address] [phone]

## 2019-10-25 NOTE — Telephone Encounter (Signed)
Patient's daughter-in-law called following up on this message.  She said that Doyce would be fine doing either that was suggested. Which do you think would be best?  She also asked if she would need to go to a rehab facility that can help her? She lives alone and is in so much pain that she is having a hard time getting around her house (she is not able to get off the toilet by herself, etc).   [name] (daughter-in-law) # [phone]

## 2019-10-25 NOTE — Progress Notes (Addendum)
TOC CSW spoke with [name], Care Manager at [facility], [phone].  Nicole [name] has offered her assistance with seeking a SNF placement for pt.  CSW will continue to follow for dc needs  [name], MSW, LCSW-A Pronouns:  She, Her, [name] ED Transitions of CareClinical Social Worker [name] [phone]

## 2019-10-25 NOTE — Telephone Encounter (Signed)
[name] called back, they had to call 911 because Nicole [name] was not able to get up at all. She is currently at the ED.   They are very concerned about her and she is very worried if they are going to admit her or not and if she is going to be able to go back home. They are wanting her to be put into a rehab facility. I told her that being admitted and what they do at the hospital will be determined by them.  But just FYI.[name]

## 2019-10-25 NOTE — ED Provider Notes (Signed)
[facility] DEPT Provider Note   CSN: [id number] Arrival date & time: 10/25/19  5501     History Chief Complaint  Patient presents with  . Back Pain    Nicole [name] is a 79 y.o. 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  Patient reports chronic issues with back pain and sciatica.  Started having issues with bilateral leg swelling starting in June/July of this year.  She was prescribed diuretics.  Tells me all of her problems started then because she started having a lot of urination and had to walk to the bathroom every 1.5 hours during the day and night.  Unfortunately 3 weeks ago she had a mechanical fall at home.  Her chronic back pain worsened.  Her back pain is across her low back and radiating to both hips.  It is much worse with movement, walking and standing.  This is exacerbated by chronic hemorrhoids that she has had.  States she cannot sit up because it causes a lot of pressure in both her back and her hemorrhoids.  She is now spending close to 24 hours in one position in her bed due to the severe pain that she has from walking and standing.  She can only take 14 steps before her pain is unbearable. She followed up with her doctor Dr. Lynne Leader for ongoing back pain.  She had a CT scan yesterday that confirmed lumbar fractures.  So far patient has been using [place] analgesic plaster, TENS unit, Tylenol arthritis for the pain but the pain is now unbearable.  She does not want any stronger pain medicines because she is afraid of the risk of addiction.  So far her family and friends have been able to help her at home but patient lives alone and she is unable to care for herself.  She is hoping to be placed in a nursing facility specifically [facility]  to help with regaining strength, physical therapy.  Her doctor, Dr. Georgina Snell had mentioned possible evaluation and referral for kyphoplasty and vertebroplasty.  Family has been using Tucks pads and ointment in the perianal area for hemorrhoids.  Denies any groin numbness, significant changes in bladder or bowel control, extremity weakness or numbness.  She otherwise has been at her baseline state of health.  No recent fever.  No chest pain, cough.  No vomiting, diarrhea or abdominal pain. She stopped taking Eliquis over 3 months ago. Also stopped taking the diuretic pill because of poor quality of life.   HPI     Past Medical History:  Diagnosis Date  . A-fib ([place])   . Adenocarcinoma of right lung, stage 3 ([place]) 07/28/2016  . Atrial flutter ([place]) 10/05/2016  . Bronchitis    hx of  . 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 Cancer-related pain 10/06/2016  . Coronary artery disease 02/21/2017   JAN 2019 Prox RCA lesion is 25% stenosed. Prox Cx lesion is 30% stenosed. Mid Cx lesion is 25% stenosed. Ost 3rd Mrg lesion is 30% stenosed. Prox LAD lesion is 25% stenosed. Mid LAD lesion is 25% stenosed. The left ventricular systolic function is normal. LV end diastolic pressure is normal. The left ventricular ejection fraction is 55-65% by visual estimate. There is no aortic valve stenosis.   N  . Diabetes mellitus  type 2  . Edema 07/12/2017  . Essential hypertension 01/20/2009  . Gallstones   . Headache   . History of radiation therapy 08/09/2016 to 09/19/2016   Right lung was treated to 60 Gy in 30 fractions at 2 Gy per fraction  . Hypertension   . Hypertriglyceridemia 08/22/2012  . Low back pain   . Lung mass    with lymphadenopathy  . Osteoarthritis   . Type II diabetes mellitus with manifestations ([place]) 01/20/2009   Estimated Creatinine Clearance: 49.8 mL/min (by C-G formula based on SCr of 1 mg/dL).    Patient Active Problem List   Diagnosis Date Noted  . Persistent atrial fibrillation  ([place]) 08/21/2019  . Senile purpura ([place]) 08/03/2019  . Decubitus ulcer of sacral region, stage 1 05/08/2019  . Diastolic dysfunction with chronic heart failure ([place]) 04/30/2019  . Paroxysmal atrial fibrillation ([place]) 02/20/2019  . Secondary hypercoagulable state ([place]) 02/20/2019  . External hemorrhoids 08/16/2018  . Hyperlipidemia with target LDL less than 100 07/18/2018  . Atherosclerosis of aorta ([place]) 07/18/2018  . Spinal stenosis at L4-L5 level 01/25/2018  . Arthritis of sacroiliac joint 01/11/2018  . Coronary artery disease 02/21/2017  . Pneumonitis, radiation ([place]) 01/26/2017  . Encounter for antineoplastic immunotherapy 10/12/2016  . Cancer-related pain 10/06/2016  . Atrial flutter ([place]) 10/05/2016  . Adenocarcinoma of right lung, stage 3 ([place]) 07/28/2016  . Encounter for antineoplastic chemotherapy 07/28/2016  . Pancreatic cyst 11/09/2015  . Routine general medical examination at a health care facility 06/30/2014  . Gout 01/18/2013  . Hypertriglyceridemia 08/22/2012  . Osteopenia 10/31/2011  . Type II diabetes mellitus with manifestations ([place]) 01/20/2009  . Essential hypertension 01/20/2009  . Osteoarthritis 01/20/2009    Past Surgical History:  Procedure Laterality Date  . ABDOMINAL HYSTERECTOMY  2004  . APPENDECTOMY  when 79 years old  . COLONOSCOPY  08-05-09   [name]  . I & D EXTREMITY Right 04/04/2019   Procedure: IRRIGATION AND DEBRIDEMENT RIGHT HAND;  Surgeon: Verner Mould, MD;  Location: [facility] ORS;  Service: Orthopedics;  Laterality: Right;  . INCONTINENCE SURGERY    . KNEE ARTHROSCOPY Left   . LEFT HEART CATH AND CORONARY ANGIOGRAPHY N/A 02/24/2017   Procedure: LEFT HEART CATH AND CORONARY ANGIOGRAPHY;  Surgeon: Jettie Booze, MD;  Location: [facility] CV LAB;  Service: Cardiovascular;  Laterality: N/A;  . LEFT HEART CATHETERIZATION WITH CORONARY ANGIOGRAM N/A 07/19/2013   Procedure: LEFT HEART CATHETERIZATION WITH CORONARY ANGIOGRAM;  Surgeon:  Sinclair Grooms, MD;  Location[facility] CATH LAB;  Service: Cardiovascular;  Laterality: N/A;  . POLYPECTOMY  08-05-09   2 polyps  . TOTAL KNEE ARTHROPLASTY Left 03/28/2012   Procedure: LEFT TOTAL KNEE ARTHROPLASTY;  Surgeon: Tobi Bastos, MD;  Location: [facility] ORS;  Service: Orthopedics;  Laterality: Left;  . TUBAL LIGATION    . VIDEO BRONCHOSCOPY WITH ENDOBRONCHIAL ULTRASOUND N/A 07/11/2016   Procedure: VIDEO BRONCHOSCOPY WITH ENDOBRONCHIAL ULTRASOUND;  Surgeon: Marshell Garfinkel, MD;  Location: [facility];  Service: Pulmonary;  Laterality: N/A;     OB History   No obstetric history on file.     Family History  Problem Relation Age of Onset  . Heart attack Father   . Heart disease Father   . Arthritis Other   . Cancer Other        colon, lst degree relative  . Diabetes Other        st degree relative  . Hyperlipidemia Other   . Hypertension Other   .  Colon cancer Neg Hx     Social History   Tobacco Use  . Smoking status: Never Smoker  . Smokeless tobacco: Never Used  Vaping Use  . Vaping Use: Never used  Substance Use Topics  . Alcohol use: No    Alcohol/week: 0.0 standard drinks  . Drug use: No    Home Medications Prior to Admission medications   Medication Sig Start Date End Date Taking? Authorizing Provider  b complex vitamins tablet Take 1 tablet by mouth daily.    [provider]  calcium-vitamin D (OSCAL WITH D) 500-200 MG-UNIT tablet Take 2 tablets by mouth daily with breakfast.    [provider]  Collagenase POWD by Does not apply route daily.     [provider]  CONTOUR NEXT TEST test strip USE 1 STRIP TO CHECK GLUCOSE TWICE DAILY 12/25/18   [name], MD  diltiazem (CARDIZEM CD) 360 MG 24 hr capsule TAKE 1 CAPSULE(360 MG) BY MOUTH DAILY 07/09/19   Jettie Booze, MD  ELIQUIS 5 MG TABS tablet Take 5 mg by mouth 2 (two) times daily. Patient not taking: Reported on 10/23/2019 09/25/19   [provider]  glucose blood test  strip Contour Next Test Strips  USE 1 STRIP TO CHECK GLUCOSE TWICE DAILY    [provider]  hydrocortisone (ANUSOL-HC) 2.5 % rectal cream Place 1 application rectally 3 (three) times daily. Use for 10 days. Patient not taking: Reported on 10/23/2019 10/18/19   Marrian Salvage, FNP  insulin glargine, 2 Unit Dial, (TOUJEO MAX SOLOSTAR) 300 UNIT/ML Solostar Pen Inject 30 Units into the skin daily. 10/14/19   Binnie Rail, MD  Insulin Pen Needle (NOVOFINE) 32G X 6 MM MISC 1 Act by Does not apply route daily. 06/17/19   [name], MD  irbesartan (AVAPRO) 300 MG tablet TAKE 1 TABLET(300 MG) BY MOUTH DAILY 09/25/19   [name], MD  Lancets (ACCU-CHEK SOFT TOUCH) lancets Use to check blood sugars daily Dx E11.9 06/18/19   [name], MD  metFORMIN (GLUCOPHAGE-XR) 500 MG 24 hr tablet TAKE 2 TABLETS(1000 MG) BY MOUTH DAILY WITH BREAKFAST 09/20/19   [name], MD  metoprolol tartrate (LOPRESSOR) 25 MG tablet Take 1 tablet every 8 hours AS NEEDED for rapid heart rate 08/01/18   Fenton, Clint R, PA  Multiple Vitamins-Minerals (MULTIPLE VITAMINS/WOMENS PO) Take by mouth daily.    [provider]  omega-3 acid ethyl esters (LOVAZA) 1 g capsule Take 2 capsules (2 g total) by mouth 2 (two) times daily. 02/05/19   [name], MD  PREBIOTIC PRODUCT PO Take 1 capsule by mouth daily.    [provider]  rosuvastatin (CRESTOR) 5 MG tablet TAKE 1 TABLET(5 MG) BY MOUTH DAILY 10/07/19   [name], MD  sulfamethoxazole-trimethoprim (BACTRIM DS) 800-160 MG tablet Take 1 tablet by mouth 2 (two) times daily. 10/18/19   Marrian Salvage, FNP  torsemide (DEMADEX) 20 MG tablet Take 1 tablet (20 mg total) by mouth every other day. Patient not taking: Reported on 10/23/2019 08/28/19   Fenton, Clint R, PA  Turmeric 500 MG CAPS Take 1,000 mg by mouth daily.    [provider]  irbesartan (AVAPRO) 300 MG tablet Take 1 tablet (300 mg total) by mouth daily. 12/12/18    [name], MD    Allergies    Patient has no known allergies.  Review of Systems   Review of Systems  Gastrointestinal:  Hemorrhoids   Musculoskeletal: Positive for back pain.  All other systems reviewed and are negative.   Physical Exam Updated Vital Signs BP 103/75   Pulse 79   Temp (!) 97.5 F (36.4 C) (Oral)   Resp 19   SpO2 98%   Physical Exam Vitals and nursing note reviewed.  Constitutional:      General: She is not in acute distress.    Appearance: She is well-developed.     Comments: NAD.  HENT:     Head: Normocephalic and atraumatic.     Right Ear: External ear normal.     Left Ear: External ear normal.     Nose: Nose normal.  Eyes:     General: No scleral icterus.    Conjunctiva/sclera: Conjunctivae normal.  Cardiovascular:     Rate and Rhythm: Normal rate and regular rhythm.     Heart sounds: Normal heart sounds. No murmur heard.      Comments: Minimal nonpitting edema pretibially bilaterally, symmetric.  1+ DP and PT pulses bilaterally. Pulmonary:     Effort: Pulmonary effort is normal.     Breath sounds: Normal breath sounds. No wheezing.  Abdominal:     Palpations: Abdomen is soft.     Tenderness: There is no abdominal tenderness.  Genitourinary:    Comments: Several external hemorrhoids noted, largest one at 6:00 is friable and very tender.  See picture below. Musculoskeletal:     Cervical back: Normal range of motion and neck supple.     Comments: T-spine: No midline or paraspinal tenderness  L-spine: No midline tenderness.  Mild paraspinal muscular tenderness, tenderness over upper/bilateral buttocks.  Patient is able to lift both legs off the bed, roll over independently.  Negative SLR bilaterally.  Patient does have significant pain with repositioning in bed but is actually able to do this independently without any help.  Skin:    General: Skin is warm and dry.     Capillary Refill: Capillary refill takes less than 2 seconds.      Comments: 3 circular, superficial, small circular sacral wounds as pictured below.  No drainage, bleeding, warmth, fluctuance.  These are exquisitely tender.  Neurological:     Mental Status: She is alert and oriented to person, place, and time.     Comments: Sensation and strength intact in lower extremities bilaterally  Psychiatric:        Behavior: Behavior normal.        Thought Content: Thought content normal.        Judgment: Judgment normal.         ED Results / Procedures / Treatments   Labs (all labs ordered are listed, but only abnormal results are displayed) Labs Reviewed  CBC WITH DIFFERENTIAL/PLATELET - Abnormal; Notable for the following components:      Result Value   Abs Immature Granulocytes 0.14 (*)    All other components within normal limits  BASIC METABOLIC PANEL - Abnormal; Notable for the following components:   Sodium 134 (*)    BUN 26 (*)    Calcium 8.6 (*)    All other components within normal limits  RESPIRATORY PANEL BY RT PCR (FLU A&B, COVID)    EKG None  Radiology CT LUMBAR SPINE WO CONTRAST  Result Date: 10/24/2019 CLINICAL DATA:  Lumbar spine compression fracture EXAM: CT LUMBAR SPINE WITHOUT CONTRAST TECHNIQUE: Multidetector CT imaging of the lumbar spine was performed without intravenous contrast administration. Multiplanar CT image reconstructions were also generated. COMPARISON:  Lumbar radiography  10/11/2019 FINDINGS: Segmentation: Transitional S1 vertebra with rudimentary S1-2 disc space. Alignment: L4-5 degenerative anterolisthesis, grade 1. Vertebrae: Nonacute L2 compression fracture, but definitely unhealed with gas following the branching fracture planes which also show sclerotic lines. L2 height loss measures up to 55%. No retropulsion. L4 remote compression fracture with superior and inferior endplate depression. Paraspinal and other soft tissues: Atheromatous calcification. No perispinal inflammation or mass seen. Disc levels: T12-  L1: Spondylolysis. L1-L2: Spondylosis with bridging osteophyte.  No impingement L2-L3: Disc narrowing and bulging. Vacuum phenomenon is present within the disc space. Moderate bilateral foraminal narrowing. L3-L4: Degenerative disc collapse with mainly foraminal and far-lateral bulging. Facet spurring on both sides as well. Moderate spinal and right foraminal impingement. L4-L5: Disc collapse with right preferential extrusion severely effacing the right foramen. There is also advanced spinal stenosis. On the left is a spur or high-density synovial cyst impinging on the left L4 nerve root. L5-S1:Asymmetric leftward disc collapse and bulging with facet and endplate spurring. High-grade spinal stenosis. Left foraminal impingement. IMPRESSION: 1. 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 Height loss measures at the 55%. No retropulsion. 2. L4 remote compression fracture. 3. 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 Foraminal impingement and spinal stenosis are also prominent at L3-4 and L5-S1. Electronically Signed   By: Monte Fantasia M.D.   On: 10/24/2019 10:42    Procedures Procedures (including critical care time)  Medications Ordered in ED Medications - No data to display  ED Course  I have reviewed the triage vital signs and the nursing notes.  Pertinent labs & imaging results that were available during my care of the patient were reviewed by me and considered in my medical decision making (see chart for details).  Clinical Course as of Oct 24 1709  Fri Oct 25, 2019  1113 [phone] Spoke to Dr Lynne Leader. Discussed expected POC in ER. Agrees with labs, pain control, CM.  He is considering kyphoplasty or vertebroplasty more as OP with IR.     [CG]  1542 Patient stopped her eliquis over 3 months ago. Stopped diuretics as well because she was having too much urination - terrible pain with  standing up so frequently    [CG]  1656 From CT lumbar wo contrast yesterday  "IMPRESSION: 1. 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 Height loss measures at the 55%. No retropulsion. 2. L4 remote compression fracture. 3. 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 Foraminal impingement and spinal stenosis are also prominent at L3-4 and L5-S1."   [CG]    Clinical Course User Index [CG] Kinnie Feil, PA-C   MDM Rules/Calculators/A&P                         EMR triage and nursing notes reviewed to obtain more history and assist with MDM.   Patient here requesting placement to SNF.   History of chronic back pain exacerbated by mechanical fall 3-4 weeks ago at home.  Prior to this she was walking for up to 45 min at a time, now only able to take 14 steps and spends close to 24 hours in one position in bed.  Family and friends unable to continue caring for her. Patient lives along. Of note, patient DISCONTINUED Eliquis over 3 months ago and was not anticoagulated during the fall. There was no  head trauma.  Has been followed by Dr Georgina Snell.   Patient followed by Dr Bro[facility] who ordered CT lumbar yesterday. CT personally reviewed, see above.  I called and spoke to Dr Georgina Snell upon patient arrival, see above. Ideally patient can be placed in SNF for PT and have OP IR evaluation for possible kyphoplasty/vertebroplasty.   Patient denies any acute medical problems. Offered pain medicines but she declined.   CM involved early, assisting with placement  Screening labs, COVID test ordered. Diet ordered.   1550: Labs reviewed and interpreted, unremarkable.  PT at bedside doing evaluation.  Signed out to oncoming EDPA Bel[place] who will continue to fu with patient. Shared with EDP Ralene Bathe. Boarder for SNF placement   Final Clinical Impression(s) / ED  Diagnoses Final diagnoses:  Compression fracture of L2 vertebra, initial encounter [facility])    Rx / [place] Orders ED Discharge Orders    None       Kinnie Feil, PA-C 10/25/19 1711    Quintella Reichert, MD 10/26/19 [phone]

## 2019-10-25 NOTE — Progress Notes (Addendum)
TOC CSW spoke with [facility], [phone].  [facility] has accepted pt can be transported on Saturday, 10/26/2019.  Contact person is Kirstin/[facility] [phone].  1st shift handoff to 2nd shift, pt family needs to be notified of acceptance and the updated on dc plans.  CSW will continue to follow for dc needs.  Nicole [name], MSW, LCSW-A Pronouns:  She, Her, Hers                  [place] ED Transitions of CareClinical Social Worker [email or web address] [phone]

## 2019-10-25 NOTE — ED Notes (Signed)
PT at bedside.

## 2019-10-25 NOTE — Evaluation (Signed)
Physical Therapy Evaluation Patient Details Name: Nicole [name] MRN: [id number] DOB: [birth year] Today's Date: 10/25/2019   History of Present Illness  79 yo female brought by EMS  10/1.21 with H/O Stage IIIa non-small cell lung cancer, adenocarcinoma , CAD,DM,HTN, Patient brought by EMS 910/1 after  family felt that pt had a fall.Patient lives alone with multiple people providing care at intervals. Patient has been non ambulatoryfor many weeks.  Lumbar CT  : L2 L4 compression fractures, Degenerative disease L4-l5.  Clinical Impression  The patient reports significant pain in the back and adamantly reports that she can not sit up. Patient did demonstrate ability to move each leg and to roll to each side.  Patient reports living alone but has multiple family and friends  To assist intermittently. Patient endorses having HHPT 2 x week, who actually was in her home for PT 9/30. Patient reports that she has been nonambulatory for some time, cousin affirms. Patient currently is requiring extensive assistance and will benefit from post acute rehab.Pt admitted with above diagnosis. Pt currently with functional limitations due to the deficits listed below (see PT Problem List). Pt will benefit from skilled PT to increase their independence and safety with mobility to allow discharge to the venue listed below.       Follow Up Recommendations SNF;Supervision/Assistance - 24 hour    Equipment Recommendations  None recommended by PT    Recommendations for Other Services       Precautions / Restrictions Precautions Precautions: Fall Precaution Comments: back pain      Mobility  Bed Mobility Overal bed mobility: Needs Assistance Bed Mobility: Rolling Rolling: Min assist         General bed mobility comments: flexes legs and reaches for rail to turn to each side. patient adamantly would not attempt to sit up stating back pain is too great.  Transfers                 General transfer  comment: unable  Ambulation/Gait                Stairs            Wheelchair Mobility    Modified Rankin (Stroke Patients Only)       Balance                                             Pertinent Vitals/Pain Pain Assessment: Faces Faces Pain Scale: Hurts worst Pain Location: back pain when attempts to roll Pain Descriptors / Indicators: Discomfort;Grimacing;Jabbing;Moaning Pain Intervention(s): Limited activity within patient's tolerance;Monitored during session    [place] expects to be discharged to:: Private residence Living Arrangements: Alone Available Help at Discharge: Family;Available PRN/intermittently Type of Home: House Home Access: Stairs to enter Entrance Stairs-Rails: Right;Left;Can reach both Entrance Stairs-Number of Steps: 5 Home Layout: One level Home Equipment: Walker - 2 wheels;Bedside commode;Wheelchair - manual      Prior Function           Comments: patient providing much mixed information about how she mobilizes. Essentially non ambulatory, uses [place] to mobilize, trnasfers to [facility], has multipkle friends and family intermitently in to assist patient.     Hand Dominance        Extremity/Trunk Assessment   Upper Extremity Assessment Upper Extremity Assessment: Defer to OT evaluation    Lower Extremity Assessment Lower Extremity  Assessment: RLE deficits/detail;LLE deficits/detail RLE Deficits / Details: slowly flexes  leg in supine RLE Sensation: history of peripheral neuropathy LLE Deficits / Details: slowly flexes leg in supine LLE Sensation: history of peripheral neuropathy       Communication   Communication: No difficulties  Cognition Arousal/Alertness: Awake/alert Behavior During Therapy: Anxious;Restless Overall Cognitive Status: Within Functional Limits for tasks assessed                                        General Comments General comments (skin  integrity, edema, etc.): NT    Exercises     Assessment/Plan    PT Assessment Patient needs continued PT services  PT Problem List Decreased strength;Decreased range of motion;Decreased activity tolerance;Pain;Decreased balance;Decreased knowledge of precautions;Decreased mobility       PT Treatment Interventions Functional mobility training;Patient/family education;Therapeutic activities;Therapeutic exercise;Wheelchair mobility training    PT Goals (Current goals can be found in the Care Plan section)  Acute Rehab PT Goals Patient Stated Goal: to go to rehab. PT Goal Formulation: With patient/family Time For Goal Achievement: 11/08/19 Potential to Achieve Goals: Fair    Frequency Min 2X/week   Barriers to discharge Decreased caregiver support      Co-evaluation               AM-PAC PT "6 Clicks" Mobility  Outcome Measure Help needed turning from your back to your side while in a flat bed without using bedrails?: A Lot Help needed moving from lying on your back to sitting on the side of a flat bed without using bedrails?: A Lot Help needed moving to and from a bed to a chair (including a wheelchair)?: Total Help needed standing up from a chair using your arms (e.g., wheelchair or bedside chair)?: Total Help needed to walk in hospital room?: Total Help needed climbing 3-5 steps with a railing? : Total 6 Click Score: 8    End of Session   Activity Tolerance: Patient limited by pain Patient left: in bed;with call bell/phone within reach;with family/visitor present Nurse Communication: Mobility status PT Visit Diagnosis: Unsteadiness on feet (R26.81);Difficulty in walking, not elsewhere classified (R26.2);Pain    Time: 2229-7989 PT Time Calculation (min) (ACUTE ONLY): 22 min   Charges:   PT Evaluation $PT Eval Moderate Complexity: [place] Pager [phone] Office [phone]   Claretha Cooper 10/25/2019, 4:52 PM

## 2019-10-25 NOTE — ED Notes (Signed)
Labs drawn. Extra tubes sent. Pt repositioned. Given warm balnket

## 2019-10-25 NOTE — Progress Notes (Signed)
CT lumbar spine does show a compression fracture that looks like it is secondary to loss of blood flow to the bone in that area.  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  Would you like me to refer to interventional radiology for kyphoplasty or vertebroplasty?

## 2019-10-26 ENCOUNTER — Other Ambulatory Visit: Payer: Self-pay

## 2019-10-26 DIAGNOSIS — I480 Paroxysmal atrial fibrillation: Secondary | ICD-10-CM | POA: Diagnosis not present

## 2019-10-26 DIAGNOSIS — Z85118 Personal history of other malignant neoplasm of bronchus and lung: Secondary | ICD-10-CM | POA: Diagnosis not present

## 2019-10-26 DIAGNOSIS — S32020A Wedge compression fracture of second lumbar vertebra, initial encounter for closed fracture: Secondary | ICD-10-CM | POA: Diagnosis not present

## 2019-10-26 DIAGNOSIS — E118 Type 2 diabetes mellitus with unspecified complications: Secondary | ICD-10-CM | POA: Diagnosis not present

## 2019-10-26 DIAGNOSIS — S12090A Other displaced fracture of first cervical vertebra, initial encounter for closed fracture: Secondary | ICD-10-CM | POA: Diagnosis not present

## 2019-10-26 DIAGNOSIS — E119 Type 2 diabetes mellitus without complications: Secondary | ICD-10-CM | POA: Diagnosis not present

## 2019-10-26 DIAGNOSIS — Z7401 Bed confinement status: Secondary | ICD-10-CM | POA: Diagnosis not present

## 2019-10-26 DIAGNOSIS — M48061 Spinal stenosis, lumbar region without neurogenic claudication: Secondary | ICD-10-CM | POA: Diagnosis not present

## 2019-10-26 DIAGNOSIS — I82402 Acute embolism and thrombosis of unspecified deep veins of left lower extremity: Secondary | ICD-10-CM | POA: Diagnosis not present

## 2019-10-26 DIAGNOSIS — Z794 Long term (current) use of insulin: Secondary | ICD-10-CM | POA: Diagnosis not present

## 2019-10-26 DIAGNOSIS — R2689 Other abnormalities of gait and mobility: Secondary | ICD-10-CM | POA: Diagnosis not present

## 2019-10-26 DIAGNOSIS — M4800 Spinal stenosis, site unspecified: Secondary | ICD-10-CM | POA: Diagnosis not present

## 2019-10-26 DIAGNOSIS — Z9114 Patient's other noncompliance with medication regimen: Secondary | ICD-10-CM | POA: Diagnosis not present

## 2019-10-26 DIAGNOSIS — K649 Unspecified hemorrhoids: Secondary | ICD-10-CM | POA: Diagnosis not present

## 2019-10-26 DIAGNOSIS — S51811A Laceration without foreign body of right forearm, initial encounter: Secondary | ICD-10-CM | POA: Diagnosis not present

## 2019-10-26 DIAGNOSIS — G8929 Other chronic pain: Secondary | ICD-10-CM | POA: Diagnosis not present

## 2019-10-26 DIAGNOSIS — M7989 Other specified soft tissue disorders: Secondary | ICD-10-CM | POA: Diagnosis not present

## 2019-10-26 DIAGNOSIS — Z7901 Long term (current) use of anticoagulants: Secondary | ICD-10-CM | POA: Diagnosis not present

## 2019-10-26 DIAGNOSIS — M6281 Muscle weakness (generalized): Secondary | ICD-10-CM | POA: Diagnosis not present

## 2019-10-26 DIAGNOSIS — I4891 Unspecified atrial fibrillation: Secondary | ICD-10-CM | POA: Diagnosis not present

## 2019-10-26 DIAGNOSIS — I119 Hypertensive heart disease without heart failure: Secondary | ICD-10-CM | POA: Diagnosis not present

## 2019-10-26 DIAGNOSIS — W19XXXS Unspecified fall, sequela: Secondary | ICD-10-CM | POA: Diagnosis not present

## 2019-10-26 DIAGNOSIS — Z96652 Presence of left artificial knee joint: Secondary | ICD-10-CM | POA: Diagnosis not present

## 2019-10-26 DIAGNOSIS — S32040A Wedge compression fracture of fourth lumbar vertebra, initial encounter for closed fracture: Secondary | ICD-10-CM | POA: Diagnosis not present

## 2019-10-26 DIAGNOSIS — M879 Osteonecrosis, unspecified: Secondary | ICD-10-CM | POA: Diagnosis not present

## 2019-10-26 DIAGNOSIS — S32010A Wedge compression fracture of first lumbar vertebra, initial encounter for closed fracture: Secondary | ICD-10-CM | POA: Diagnosis not present

## 2019-10-26 DIAGNOSIS — E785 Hyperlipidemia, unspecified: Secondary | ICD-10-CM | POA: Diagnosis not present

## 2019-10-26 DIAGNOSIS — M545 Low back pain, unspecified: Secondary | ICD-10-CM | POA: Diagnosis not present

## 2019-10-26 DIAGNOSIS — M2550 Pain in unspecified joint: Secondary | ICD-10-CM | POA: Diagnosis not present

## 2019-10-26 DIAGNOSIS — Z79899 Other long term (current) drug therapy: Secondary | ICD-10-CM | POA: Diagnosis not present

## 2019-10-26 DIAGNOSIS — I5030 Unspecified diastolic (congestive) heart failure: Secondary | ICD-10-CM | POA: Diagnosis not present

## 2019-10-26 DIAGNOSIS — R41841 Cognitive communication deficit: Secondary | ICD-10-CM | POA: Diagnosis not present

## 2019-10-26 DIAGNOSIS — R5381 Other malaise: Secondary | ICD-10-CM | POA: Diagnosis not present

## 2019-10-26 DIAGNOSIS — S32000S Wedge compression fracture of unspecified lumbar vertebra, sequela: Secondary | ICD-10-CM | POA: Diagnosis not present

## 2019-10-26 DIAGNOSIS — M5136 Other intervertebral disc degeneration, lumbar region: Secondary | ICD-10-CM | POA: Diagnosis not present

## 2019-10-26 DIAGNOSIS — S32020D Wedge compression fracture of second lumbar vertebra, subsequent encounter for fracture with routine healing: Secondary | ICD-10-CM | POA: Diagnosis not present

## 2019-10-26 DIAGNOSIS — I10 Essential (primary) hypertension: Secondary | ICD-10-CM | POA: Diagnosis not present

## 2019-10-26 DIAGNOSIS — L89153 Pressure ulcer of sacral region, stage 3: Secondary | ICD-10-CM | POA: Diagnosis not present

## 2019-10-26 LAB — CBG MONITORING, ED
Glucose-Capillary: 77 mg/dL (ref 70–99)
Glucose-Capillary: 120 mg/dL — ABNORMAL HIGH (ref 70–99)
Glucose-Capillary: 75 mg/dL (ref 70–99)

## 2019-10-26 NOTE — Discharge Instructions (Addendum)
Follow-up with Dr. Georgina Snell, your orthopedist for further treatment of your lumbar compression fracture.

## 2019-10-26 NOTE — Progress Notes (Signed)
CSW spoke to [facility] who state to accept pt admissions there needs to receive pt's AVS to facilitate acceptance.  CSW will request EDP release this so CSW can provide to the facility.  CSW will update RN and EDP.  Alphonse Guild. Onnie Alatorre, Latanya Presser, LCAS Clinical Social Worker Ph: [phone]

## 2019-10-26 NOTE — ED Notes (Signed)
Gave patient graham crackers with peanut butter and apple juice.

## 2019-10-26 NOTE — Progress Notes (Signed)
CSW spoke to [place] at ph: at [facility] who stated the patient has been offered a bed and has been accepted and that the pt can arrive today ASAP (10/26/19).  The pt's accepting doctor is the SNF MD.  The room number will be room # 106 on [facility].  The number for report is [id number].  RN: [place] please asks that it be emphasized to PTAR that room #106 is "on [employer]" at [facility].  RN/EDP updated.  AVS has been provided to [facility] and pt's RN.  CSW will continue to follow for D/C needs.  Alphonse Guild. Mathews Stuhr  MSW, LCSW, LCAS, CCS Transitions of Care Clinical Social Worker Care Coordination Department Ph: [phone]

## 2019-10-26 NOTE — Progress Notes (Signed)
2nd shift ED CSW received a handoff from the 1st shift [facility] ED CSW.   CSW following up with [facility] for planned placement there todasy to receive accepting information.  CSW will continue to follow for D/C needs.  Alphonse Guild. Yanitza Shvartsman  MSW, LCSW, LCAS, CCS Transitions of Care Clinical Social Worker Care Coordination Department Ph: [phone]

## 2019-10-26 NOTE — ED Provider Notes (Signed)
Emergency Medicine Observation Re-evaluation Note  Nicole [name] is a 79 y.o. female, seen on rounds today.  Pt initially presented to the ED for complaints of Back Pain Currently, the patient is sleeping.  Physical Exam  BP 116/69 (BP Location: Right Arm)   Pulse 74   Temp 98 F (36.7 C) (Oral)   Resp 16   SpO2 96%  Physical Exam General: well appearing, sleeping Cardiac: normal rate Lungs: no increased wob, no abnormal lung sounds Psych: sleeping  ED Course / MDM  EKG:  Clinical Course as of Oct 26 906  Fri Oct 25, 2019  1113 [phone] Spoke to Dr Lynne Leader. Discussed expected POC in ER. Agrees with labs, pain control, CM.  He is considering kyphoplasty or vertebroplasty more as OP with IR.     [CG]  1542 Patient stopped her eliquis over 3 months ago. Stopped diuretics as well because she was having too much urination - terrible pain with standing up so frequently    [CG]  1656 From CT lumbar wo contrast yesterday  "IMPRESSION: 1. 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 Height loss measures at the 55%. No retropulsion. 2. L4 remote compression fracture. 3. 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 Foraminal impingement and spinal stenosis are also prominent at L3-4 and L5-S1."   [CG]    Clinical Course User Index [CG] Kinnie Feil, PA-C   I have reviewed the labs performed to date as well as medications administered while in observation.  Recent changes in the last 24 hours include has been assessed by PT, awaiting placement.  Plan  Current plan is for placement, should go to [facility] today. Patient is not under full IVC at this time.   Malvin Johns, MD 10/26/19 [phone]

## 2019-10-26 NOTE — ED Notes (Signed)
Patient resting comfortably at this time. Rise and fall of chest noted.

## 2019-10-28 DIAGNOSIS — S32040A Wedge compression fracture of fourth lumbar vertebra, initial encounter for closed fracture: Secondary | ICD-10-CM | POA: Diagnosis not present

## 2019-10-28 DIAGNOSIS — M48061 Spinal stenosis, lumbar region without neurogenic claudication: Secondary | ICD-10-CM | POA: Diagnosis not present

## 2019-10-28 DIAGNOSIS — I5030 Unspecified diastolic (congestive) heart failure: Secondary | ICD-10-CM | POA: Diagnosis not present

## 2019-10-28 DIAGNOSIS — S32020A Wedge compression fracture of second lumbar vertebra, initial encounter for closed fracture: Secondary | ICD-10-CM | POA: Diagnosis not present

## 2019-10-28 NOTE — Telephone Encounter (Signed)
Pt's daughter, [name], has been informed that standing order paper work was faxed back to [facility] [employer].

## 2019-10-29 ENCOUNTER — Ambulatory Visit (HOSPITAL_COMMUNITY): Payer: Medicare Other

## 2019-10-29 DIAGNOSIS — I10 Essential (primary) hypertension: Secondary | ICD-10-CM | POA: Diagnosis not present

## 2019-10-29 DIAGNOSIS — E119 Type 2 diabetes mellitus without complications: Secondary | ICD-10-CM | POA: Diagnosis not present

## 2019-10-29 DIAGNOSIS — I4891 Unspecified atrial fibrillation: Secondary | ICD-10-CM | POA: Diagnosis not present

## 2019-10-29 DIAGNOSIS — E785 Hyperlipidemia, unspecified: Secondary | ICD-10-CM | POA: Diagnosis not present

## 2019-10-31 ENCOUNTER — Other Ambulatory Visit: Payer: Self-pay | Admitting: Family Medicine

## 2019-10-31 DIAGNOSIS — I10 Essential (primary) hypertension: Secondary | ICD-10-CM | POA: Diagnosis not present

## 2019-10-31 DIAGNOSIS — E119 Type 2 diabetes mellitus without complications: Secondary | ICD-10-CM | POA: Diagnosis not present

## 2019-10-31 DIAGNOSIS — S51811A Laceration without foreign body of right forearm, initial encounter: Secondary | ICD-10-CM | POA: Diagnosis not present

## 2019-10-31 DIAGNOSIS — S32020A Wedge compression fracture of second lumbar vertebra, initial encounter for closed fracture: Secondary | ICD-10-CM

## 2019-10-31 DIAGNOSIS — L89153 Pressure ulcer of sacral region, stage 3: Secondary | ICD-10-CM | POA: Diagnosis not present

## 2019-10-31 NOTE — Addendum Note (Signed)
Addended by: Gregor Hams on: 10/31/2019 09:54 AM   Modules accepted: Orders

## 2019-10-31 NOTE — Telephone Encounter (Signed)
I did some research and spoke with a different radiology group.  I found somebody who thinks that you are a good candidate for kyphoplasty.  We are placing a new referral now and you should hear from them soon. However they think an MRI would also be helpful as it may show other areas that may be causing your pain and can be treated at the same time.  I have already ordered an MRI to [facility] in [place].

## 2019-11-04 ENCOUNTER — Telehealth: Payer: Self-pay | Admitting: Family Medicine

## 2019-11-04 ENCOUNTER — Other Ambulatory Visit: Payer: Medicare Other

## 2019-11-04 NOTE — Telephone Encounter (Signed)
Dr. Georgina Snell reordered MRI to be done at the hospital, Pre cert needs to be changed and then patient will have to call and schedule. Will call insurance to change location and inform patient or Daughter In-law once done.

## 2019-11-04 NOTE — Addendum Note (Signed)
Addended by: Gregor Hams on: 11/04/2019 12:06 PM   Modules accepted: Orders

## 2019-11-04 NOTE — Telephone Encounter (Signed)
Patient's daughter in law called stating that she was schedule for an MRI this morning at [place] but they had to cancel it. They were told that the patient would have to be able to be able to get on and off the table by herself and she cannot do that. They would like to see if this can be ordered at the hospital so she will have help.  Please advise.  Call back # [phone]

## 2019-11-05 DIAGNOSIS — M4800 Spinal stenosis, site unspecified: Secondary | ICD-10-CM | POA: Diagnosis not present

## 2019-11-05 DIAGNOSIS — E118 Type 2 diabetes mellitus with unspecified complications: Secondary | ICD-10-CM | POA: Diagnosis not present

## 2019-11-05 DIAGNOSIS — M879 Osteonecrosis, unspecified: Secondary | ICD-10-CM | POA: Diagnosis not present

## 2019-11-05 DIAGNOSIS — S12090A Other displaced fracture of first cervical vertebra, initial encounter for closed fracture: Secondary | ICD-10-CM | POA: Diagnosis not present

## 2019-11-06 ENCOUNTER — Telehealth: Payer: Medicare Other

## 2019-11-07 DIAGNOSIS — L89153 Pressure ulcer of sacral region, stage 3: Secondary | ICD-10-CM | POA: Diagnosis not present

## 2019-11-07 DIAGNOSIS — I10 Essential (primary) hypertension: Secondary | ICD-10-CM | POA: Diagnosis not present

## 2019-11-07 DIAGNOSIS — S51811A Laceration without foreign body of right forearm, initial encounter: Secondary | ICD-10-CM | POA: Diagnosis not present

## 2019-11-07 DIAGNOSIS — E119 Type 2 diabetes mellitus without complications: Secondary | ICD-10-CM | POA: Diagnosis not present

## 2019-11-08 DIAGNOSIS — Z9114 Patient's other noncompliance with medication regimen: Secondary | ICD-10-CM | POA: Diagnosis not present

## 2019-11-08 DIAGNOSIS — I82402 Acute embolism and thrombosis of unspecified deep veins of left lower extremity: Secondary | ICD-10-CM | POA: Diagnosis not present

## 2019-11-08 DIAGNOSIS — S32020A Wedge compression fracture of second lumbar vertebra, initial encounter for closed fracture: Secondary | ICD-10-CM | POA: Diagnosis not present

## 2019-11-08 DIAGNOSIS — G8929 Other chronic pain: Secondary | ICD-10-CM | POA: Diagnosis not present

## 2019-11-09 ENCOUNTER — Other Ambulatory Visit: Payer: Self-pay

## 2019-11-09 ENCOUNTER — Ambulatory Visit
Admission: RE | Admit: 2019-11-09 | Discharge: 2019-11-09 | Disposition: A | Discharge status: Home / Self Care | Payer: Medicare Other | Source: Ambulatory Visit | Attending: Family Medicine | Admitting: Family Medicine

## 2019-11-09 DIAGNOSIS — M545 Low back pain, unspecified: Secondary | ICD-10-CM | POA: Diagnosis not present

## 2019-11-09 DIAGNOSIS — M48061 Spinal stenosis, lumbar region without neurogenic claudication: Secondary | ICD-10-CM | POA: Diagnosis not present

## 2019-11-09 DIAGNOSIS — S32020A Wedge compression fracture of second lumbar vertebra, initial encounter for closed fracture: Secondary | ICD-10-CM

## 2019-11-10 ENCOUNTER — Other Ambulatory Visit: Payer: Medicare Other

## 2019-11-11 ENCOUNTER — Other Ambulatory Visit: Payer: Self-pay | Admitting: *Deleted

## 2019-11-11 DIAGNOSIS — K649 Unspecified hemorrhoids: Secondary | ICD-10-CM | POA: Diagnosis not present

## 2019-11-11 DIAGNOSIS — G8929 Other chronic pain: Secondary | ICD-10-CM | POA: Diagnosis not present

## 2019-11-11 DIAGNOSIS — S32040A Wedge compression fracture of fourth lumbar vertebra, initial encounter for closed fracture: Secondary | ICD-10-CM | POA: Diagnosis not present

## 2019-11-11 NOTE — Progress Notes (Signed)
MRI lumbar spine shows new compression fractures seen on CT scan.  Should proceed with kyphoplasty or vertebroplasty.  Is this scheduled yet?

## 2019-11-12 ENCOUNTER — Ambulatory Visit (HOSPITAL_COMMUNITY): Payer: Medicare Other

## 2019-11-13 ENCOUNTER — Encounter: Payer: Self-pay | Admitting: *Deleted

## 2019-11-13 ENCOUNTER — Ambulatory Visit
Admission: RE | Admit: 2019-11-13 | Discharge: 2019-11-13 | Disposition: A | Discharge status: Home / Self Care | Payer: Medicare Other | Source: Ambulatory Visit | Attending: Family Medicine | Admitting: Family Medicine

## 2019-11-13 ENCOUNTER — Encounter: Payer: Self-pay | Admitting: Family Medicine

## 2019-11-13 ENCOUNTER — Other Ambulatory Visit: Payer: Self-pay

## 2019-11-13 DIAGNOSIS — S32020A Wedge compression fracture of second lumbar vertebra, initial encounter for closed fracture: Secondary | ICD-10-CM

## 2019-11-13 HISTORY — PX: IR RADIOLOGIST EVAL & MGMT: IMG5224

## 2019-11-13 NOTE — Progress Notes (Signed)
Dr. Georgina Snell - for some reason the people here at [facility] are insistent on transporting my mom via wheelchair to any appointments. Last Saturday - even though on a pain killer and muscle relaxer - she was in excruciating pain just sitting in wheelchair waiting to get MRI - we waited only about 20 minutes before she could lie down on MRI table. Could you please help? She really needs to be transported via stretcher until we can address her back.  [place] head of Nursing is Lauris Chroman - [phone] and email is [email or web address] and social worker is Elnita Maxwell at [phone]. Thanks. Antonio Woodhams (daughter)

## 2019-11-13 NOTE — Consult Note (Signed)
Chief Complaint: Patient was consulted remotely today (TeleHealth) for vertebral body compression fracture at the request of [place].    Referring Physician(s): Corey,Evan S   History of Present Illness: [name] is a 79 y.o. female with multiple medical problems including history of lung cancer, atrial fibrillation, atrial flutter, diabetes and recent diagnosis of left lower extremity DVT.  Patient is currently at [facility] for rehabilitation because of her back pain.  She has been at [facility] since October 2.  Patient was unable to come to the office due to her back pain and recently had an MRI of the lumbar spine that was very difficult for her due to her symptoms.  According to the patient and daughter, the patient has minimal pain while she is laying on her back but has severe pain when she is sitting up in a chair or wheelchair.  She does not get relief from pain medications when she is sitting up.  She is able to ambulate but with pain and difficulty.  According to the daughter, the back symptoms started in July with some sciatic pain.  She fell in September and has had significant back pain since.  Lumbar spine radiograph on 10/11/2019 demonstrated a L2 compression fracture.  Compression fracture was also noted on a CT from 10/24/2019.  MRI on 11/09/2019 demonstrates the L2 compression fracture and an acute L1 compression fracture.  In addition, the MRI demonstrated lumbar spondylosis with degenerative disc disease causing severe impingement at L4-L5 and mild to moderate impingement at L5-S1.  Patient stopped taking her regular Eliquis recently.  I am not sure why she was on Eliquis but it may have been related to the atrial fibrillation.  Patient was diagnosed recently with a left lower extremity DVT and put back on Eliquis..  I spoke with a nurse practitioner at [facility], [place], and she told me that the DVT is involving the left common femoral vein, left femoral  vein, left popliteal vein and left great saphenous vein.    Past Medical History:  Diagnosis Date  . A-fib ([place])   . Adenocarcinoma of right lung, stage 3 ([place]) 07/28/2016  . Atrial flutter ([place]) 10/05/2016  . Bronchitis    hx of  . 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 Cancer-related pain 10/06/2016  . Coronary artery disease 02/21/2017   JAN 2019 Prox RCA lesion is 25% stenosed. Prox Cx lesion is 30% stenosed. Mid Cx lesion is 25% stenosed. Ost 3rd Mrg lesion is 30% stenosed. Prox LAD lesion is 25% stenosed. Mid LAD lesion is 25% stenosed. The left ventricular systolic function is normal. LV end diastolic pressure is normal. The left ventricular ejection fraction is 55-65% by visual estimate. There is no aortic valve stenosis.   N  . Diabetes mellitus    type 2  . Edema 07/12/2017  . Essential hypertension 01/20/2009  . Gallstones   . Headache   . History of radiation therapy 08/09/2016 to 09/19/2016   Right lung was treated to 60 Gy in 30 fractions at 2 Gy per fraction  . Hypertension   . Hypertriglyceridemia 08/22/2012  . Low back pain   . Lung mass    with lymphadenopathy  . Osteoarthritis   . Type II diabetes mellitus with manifestations ([place]) 01/20/2009   Estimated Creatinine Clearance: 49.8 mL/min (by C-G formula based on SCr of 1 mg/dL).    Past Surgical History:  Procedure Laterality Date  . ABDOMINAL HYSTERECTOMY  2004  .  APPENDECTOMY  when 79 years old  . COLONOSCOPY  08-05-09   [name]  . I & D EXTREMITY Right 04/04/2019   Procedure: IRRIGATION AND DEBRIDEMENT RIGHT HAND;  Surgeon: Verner Mould, MD;  Location: [facility] ORS;  Service: Orthopedics;  Laterality: Right;  . INCONTINENCE SURGERY    . KNEE ARTHROSCOPY Left   . LEFT HEART CATH AND CORONARY ANGIOGRAPHY N/A 02/24/2017   Procedure: LEFT HEART CATH AND CORONARY ANGIOGRAPHY;  Surgeon: Jettie Booze, MD;  Location: [place] CV LAB;  Service: Cardiovascular;  Laterality: N/A;  . LEFT HEART  CATHETERIZATION WITH CORONARY ANGIOGRAM N/A 07/19/2013   Procedure: LEFT HEART CATHETERIZATION WITH CORONARY ANGIOGRAM;  Surgeon: Sinclair Grooms, MD;  Location: [facility] CATH LAB;  Service: Cardiovascular;  Laterality: N/A;  . POLYPECTOMY  08-05-09   2 polyps  . TOTAL KNEE ARTHROPLASTY Left 03/28/2012   Procedure: LEFT TOTAL KNEE ARTHROPLASTY;  Surgeon: Tobi Bastos, MD;  Location: [facility];  Service: Orthopedics;  Laterality: Left;  . TUBAL LIGATION    . VIDEO BRONCHOSCOPY WITH ENDOBRONCHIAL ULTRASOUND N/A 07/11/2016   Procedure: VIDEO BRONCHOSCOPY WITH ENDOBRONCHIAL ULTRASOUND;  Surgeon: Marshell Garfinkel, MD;  Location: [name];  Service: Pulmonary;  Laterality: N/A;    Allergies: Patient has no known allergies.  Medications: Prior to Admission medications   Medication Sig Start Date End Date Taking? Authorizing Provider  acetaminophen (TYLENOL) 325 MG tablet Take 650 mg by mouth every 6 (six) hours as needed for headache.    [provider]  b complex vitamins tablet Take 1 tablet by mouth daily.    [provider]  calcium-vitamin D (OSCAL WITH D) 500-200 MG-UNIT tablet Take 2 tablets by mouth daily with breakfast.    [provider]  Collagenase POWD by Does not apply route daily.     [provider]  CONTOUR NEXT TEST test strip USE 1 STRIP TO CHECK GLUCOSE TWICE DAILY 12/25/18   [name], MD  dapagliflozin propanediol (FARXIGA) 10 MG TABS tablet Take 10 mg by mouth daily.    [provider]  diltiazem (CARDIZEM CD) 360 MG 24 hr capsule TAKE 1 CAPSULE(360 MG) BY MOUTH DAILY Patient taking differently: Take 360 mg by mouth daily.  07/09/19   Jettie Booze, MD  ELIQUIS 5 MG TABS tablet Take 5 mg by mouth 2 (two) times daily.  Patient not taking: Reported on 10/26/2019 09/25/19   [provider]  glucose blood test strip Contour Next Test Strips  USE 1 STRIP TO CHECK GLUCOSE TWICE DAILY    [provider]  hydrocortisone  (ANUSOL-HC) 2.5 % rectal cream Place 1 application rectally 3 (three) times daily. Use for 10 days. 10/18/19   Marrian Salvage, FNP  insulin glargine, 2 Unit Dial, (TOUJEO MAX SOLOSTAR) 300 UNIT/ML Solostar Pen Inject 30 Units into the skin daily. 10/14/19   Binnie Rail, MD  Insulin Pen Needle (NOVOFINE) 32G X 6 MM MISC 1 Act by Does not apply route daily. 06/17/19   [name], MD  irbesartan (AVAPRO) 300 MG tablet TAKE 1 TABLET(300 MG) BY MOUTH DAILY Patient taking differently: Take 300 mg by mouth daily.  09/25/19   [name], MD  Lancets (ACCU-CHEK SOFT [facility]) lancets Use to check blood sugars daily Dx E11.9 06/18/19   [name], MD  metFORMIN (GLUCOPHAGE-XR) 500 MG 24 hr tablet TAKE 2 TABLETS(1000 MG) BY MOUTH DAILY WITH BREAKFAST Patient taking differently: Take 1,000 mg by mouth daily  with breakfast.  09/20/19   [name], MD  metoprolol tartrate (LOPRESSOR) 25 MG tablet Take 1 tablet every 8 hours AS NEEDED for rapid heart rate 08/01/18   Fenton, Clint R, PA  Multiple Vitamins-Minerals (MULTIPLE VITAMINS/WOMENS PO) Take by mouth daily.    [provider]  omega-3 acid ethyl esters (LOVAZA) 1 g capsule Take 2 capsules (2 g total) by mouth 2 (two) times daily. Patient not taking: Reported on 10/26/2019 02/05/19   [name], MD  PREBIOTIC PRODUCT PO Take 1 capsule by mouth daily.    [provider]  rosuvastatin (CRESTOR) 5 MG tablet TAKE 1 TABLET(5 MG) BY MOUTH DAILY Patient taking differently: Take 5 mg by mouth daily.  10/07/19   [name], MD  sulfamethoxazole-trimethoprim (BACTRIM DS) 800-160 MG tablet Take 1 tablet by mouth 2 (two) times daily. 10/18/19   Marrian Salvage, FNP  torsemide (DEMADEX) 20 MG tablet Take 1 tablet (20 mg total) by mouth every other day. Patient not taking: Reported on 10/26/2019 08/28/19   Fenton, Clint R, PA  irbesartan (AVAPRO) 300 MG tablet Take 1 tablet (300 mg total) by mouth daily. 12/12/18   [name], MD     Family History  Problem Relation Age of Onset  . Heart attack Father   . Heart disease Father   . Arthritis Other   . Cancer Other        colon, lst degree relative  . Diabetes Other        st degree relative  . Hyperlipidemia Other   . Hypertension Other   . Colon cancer Neg Hx     Social History   Socioeconomic History  . Marital status: Widowed    Spouse name: Not on file  . Number of children: 3  . Years of education: Not on file  . Highest education level: Not on file  Occupational History  . Occupation: retired    Fish farm manager: RETIRED  Tobacco Use  . Smoking status: Never Smoker  . Smokeless tobacco: Never Used  Vaping Use  . Vaping Use: Never used  Substance and Sexual Activity  . Alcohol use: No    Alcohol/week: 0.0 standard drinks  . Drug use: No  . Sexual activity: Not Currently  Other Topics Concern  . Not on file  Social History Narrative   Regular exercise- Yes   Social Determinants of Health   Financial Resource Strain:   . Difficulty of Paying Living Expenses: Not on file  Food Insecurity: No Food Insecurity  . Worried About Charity fundraiser in the Last Year: Never true  . Ran Out of Food in the Last Year: Never true  Transportation Needs: No Transportation Needs  . Lack of Transportation (Medical): No  . Lack of Transportation (Non-Medical): No  Physical Activity:   . Days of Exercise per Week: Not on file  . Minutes of Exercise per Session: Not on file  Stress:   . Feeling of Stress : Not on file  Social Connections:   . Frequency of Communication with Friends and Family: Not on file  . Frequency of Social Gatherings with Friends and Family: Not on file  . Attends Religious Services: Not on file  . Active Member of Clubs or Organizations: Not on file  . Attends Archivist Meetings: Not on file  . Marital Status: Not on file      Review of Systems  Musculoskeletal: Positive for back pain.  Left leg  swelling.     Physical Exam No direct physical exam was performed   Vital Signs: There were no vitals taken for this visit.  Imaging: CT LUMBAR SPINE WO CONTRAST  Result Date: 10/24/2019 CLINICAL DATA:  Lumbar spine compression fracture EXAM: CT LUMBAR SPINE WITHOUT CONTRAST TECHNIQUE: Multidetector CT imaging of the lumbar spine was performed without intravenous contrast administration. Multiplanar CT image reconstructions were also generated. COMPARISON:  Lumbar radiography 10/11/2019 FINDINGS: Segmentation: Transitional S1 vertebra with rudimentary S1-2 disc space. Alignment: L4-5 degenerative anterolisthesis, grade 1. Vertebrae: Nonacute L2 compression fracture, but definitely unhealed with gas following the branching fracture planes which also show sclerotic lines. L2 height loss measures up to 55%. No retropulsion. L4 remote compression fracture with superior and inferior endplate depression. Paraspinal and other soft tissues: Atheromatous calcification. No perispinal inflammation or mass seen. Disc levels: T12- L1: Spondylolysis. L1-L2: Spondylosis with bridging osteophyte.  No impingement L2-L3: Disc narrowing and bulging. Vacuum phenomenon is present within the disc space. Moderate bilateral foraminal narrowing. L3-L4: Degenerative disc collapse with mainly foraminal and far-lateral bulging. Facet spurring on both sides as well. Moderate spinal and right foraminal impingement. L4-L5: Disc collapse with right preferential extrusion severely effacing the right foramen. There is also advanced spinal stenosis. On the left is a spur or high-density synovial cyst impinging on the left L4 nerve root. L5-S1:Asymmetric leftward disc collapse and bulging with facet and endplate spurring. High-grade spinal stenosis. Left foraminal impingement. IMPRESSION: 1. 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 Height loss measures at the 55%. No  retropulsion. 2. L4 remote compression fracture. 3. 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 Foraminal impingement and spinal stenosis are also prominent at L3-4 and L5-S1. Electronically Signed   By: Monte Fantasia M.D.   On: 10/24/2019 10:42   MR Lumbar Spine Wo Contrast  Result Date: 11/09/2019 CLINICAL DATA:  Low back pain.  Known compression fracture at L2. EXAM: MRI LUMBAR SPINE WITHOUT CONTRAST TECHNIQUE: Multiplanar, multisequence MR imaging of the lumbar spine was performed. No intravenous contrast was administered. COMPARISON:  Multiple exams, including lumbar spine CT from 10/24/2019 FINDINGS: Segmentation: Mildly transitional S1, but L5 is the lowest fully segmental lumbar type rib-bearing vertebra. Alignment: 4 mm degenerative anterolisthesis at L4-5 and 3 mm degenerative anterolisthesis at L3-4. Vertebrae: Compression fracture at L1 with superior endplate and central anterior incomplete transverse components but only about 20% loss of height. Associated vertebral edema and visible fracture plane transversely in the anterior inferior vertebral body. 50% compression fracture of L2 with marrow edema, similar to that shown on recent CT. Remote endplate compression fractures at L4. At the very bottom of the sagittal images we demonstrate a transverse fracture through the upper S3 vertebral level (counting the slightly transitional vertebra is S1). Conus medullaris and cauda equina: Conus extends to the L2 level. Conus and cauda equina appear normal. Paraspinal and other soft tissues: Suspected peripelvic cysts on the right. Mild paraspinal edema at the L1-2 level likely related to the known compression fractures. Disc levels: T12-L1: No impingement.  Mild disc bulge. L1-2: No impingement.  Notable right lateral intervertebral spurring L2-3: Borderline bilateral foraminal stenosis primarily from facet arthropathy. L4-5: Mild right foraminal stenosis due to disc  bulge, intervertebral spurring, and facet arthropathy. Borderline central narrowing of the thecal sac. L4-5: Prominent central narrowing of the thecal sac with prominent right and moderate left foraminal stenosis due to disc uncovering, disc bulge,  broad right paracentral disc protrusion extending into the neural foramen, and facet arthropathy. Cross-sectional area of the thecal sac is narrowed to 0.3 cm^2 at this level and the right disc protrusion extends into the lateral extraforaminal space with displacement of the right L4 spinal nerve. L5-S1: Mild to moderate left foraminal stenosis due to disc bulge and facet spurring. S1-2: No impingement.  Only rudimentary disc material at this level. IMPRESSION: 1. Acute to subacute compression fracture at L1 with only about 20% loss of height. This is newly apparent compared to the CT lumbar spine from 10/24/2019. 2. Subacute compression fracture of L2 with marrow edema, as shown on recent CT. Remote endplate compression fractures at L4. 3. Lumbar spondylosis and degenerative disc disease, causing severe impingement at L4-5; mild to moderate impingement at L5-S1; and mild impingement at L4-5, as detailed above. 4. Acute or subacute transverse fracture through the upper S3 vertebral level, included on the sagittal images only. Electronically Signed   By: Van Clines M.D.   On: 11/09/2019 14:09    Labs:  CBC: Recent Labs    04/05/19 0509 07/15/19 1156 10/21/19 0940 10/25/19 1135  WBC 14.2* 8.9 9.1 7.4  HGB 14.7 15.4* 13.3 13.6  HCT 45.7 47.1* 40.6 41.7  PLT 224 209 233 249    COAGS: Recent Labs    04/02/19 1313  INR 1.1  APTT 29    BMP: Recent Labs    07/15/19 1156 08/21/19 1420 10/21/19 0940 10/25/19 1135  NA 140 138 133* 134*  K 4.3 4.1 4.4 3.5  CL 108 102 103 101  CO2 23 24 25 23   GLUCOSE 182* 259* 158* 85  BUN 34* 24* 23 26*  CALCIUM 8.7* 9.0 8.6* 8.6*  CREATININE 1.05* 0.93 1.01* 0.70  GFRNONAA 52* 60* 54* >60  GFRAA 60*  >60 >60 >60    LIVER FUNCTION TESTS: Recent Labs    01/10/19 0911 04/02/19 1313 07/15/19 1156 10/21/19 0940  BILITOT 0.7 0.9 0.8 0.6  AST 15 15 26  13*  ALT 19 24 55* 26  ALKPHOS 60 54 67 117  PROT 6.8 5.9* 6.3* 6.0*  ALBUMIN 3.3* 2.9* 3.1* 2.6*    TUMOR MARKERS: No results for input(s): AFPTM, CEA, CA199, CHROMGRNA in the last 8760 hours.  Assessment and Plan:  79 year old female with multiple medical problems including severe debilitating back pain that has required the patient to leave her home and go to [facility].  Recent MRI demonstrates compression fractures at L1 and L2.  Patient also has degenerative disc disease with spinal canal narrowing at L4-5.  The patient is not having sciatica or radicular symptoms in lower extremities.  She localizes the pain to the lower back region.  In addition, the patient has a partially visualized S3 fracture on the MRI.  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  I think the patient would be a candidate for vertebral body augmentation with kyphoplasty at L1 and L2.  Will need to coordinate care with [facility] due to the recent diagnosis of DVT and restarting Eliquis.  Explained to the patient and family that we will schedule her for the vertebral body augmentation procedure.  If her exam findings correspond with her symptoms and MRI findings, then we will proceed with the procedure.  I would like the patient to stop her Eliquis for at least 2 days before the procedure.  Will bridge her anticoagulation with Lovenox  and hold a dose of the Lovenox prior to the vertebral body augmentation procedure.  The patient could be restarted on Eliquis after the procedure.  Patient and family are comfortable with having one of my partners perform the procedure if it would expedite care.  Hopefully, we can schedule the procedure for next week.  Thank you for this interesting  consult.  I greatly enjoyed meeting [name] and look forward to participating in their care.  A copy of this report was sent to the requesting provider on this date.  Electronically Signed: Burman Riis 11/13/2019, 10:16 AM   I spent a total of  25 minutes  in remote  clinical consultation, greater than 50% of which was counseling/coordinating care for vertebral body compression fractures.    Visit type: Audio only (telephone). Audio (no video) only due to patient being in rehabilitation center.. Alternative for in-person consultation at [facility], [address], [place], [place]. This visit type was conducted due to national recommendations for restrictions regarding the COVID-19 Pandemic (e.g. social distancing).  This format is felt to be most appropriate for this patient at this time.  All issues noted in this document were discussed and addressed.

## 2019-11-14 ENCOUNTER — Other Ambulatory Visit (HOSPITAL_COMMUNITY): Payer: Self-pay | Admitting: Diagnostic Radiology

## 2019-11-14 DIAGNOSIS — S32010A Wedge compression fracture of first lumbar vertebra, initial encounter for closed fracture: Secondary | ICD-10-CM

## 2019-11-19 DIAGNOSIS — E119 Type 2 diabetes mellitus without complications: Secondary | ICD-10-CM | POA: Diagnosis not present

## 2019-11-19 DIAGNOSIS — E785 Hyperlipidemia, unspecified: Secondary | ICD-10-CM | POA: Diagnosis not present

## 2019-11-19 DIAGNOSIS — I480 Paroxysmal atrial fibrillation: Secondary | ICD-10-CM | POA: Diagnosis not present

## 2019-11-19 DIAGNOSIS — I10 Essential (primary) hypertension: Secondary | ICD-10-CM | POA: Diagnosis not present

## 2019-11-20 ENCOUNTER — Other Ambulatory Visit: Payer: Self-pay | Admitting: Radiology

## 2019-11-21 ENCOUNTER — Ambulatory Visit (HOSPITAL_COMMUNITY): Admission: RE | Admit: 2019-11-21 | Payer: Medicare Other | Source: Ambulatory Visit

## 2019-11-21 ENCOUNTER — Telehealth (HOSPITAL_COMMUNITY): Payer: Self-pay | Admitting: Radiology

## 2019-11-21 ENCOUNTER — Ambulatory Visit: Payer: Medicare Other | Admitting: Physician Assistant

## 2019-11-21 ENCOUNTER — Other Ambulatory Visit: Payer: Self-pay | Admitting: Radiology

## 2019-11-21 NOTE — Telephone Encounter (Signed)
[place] to verify that pt has transportation for her procedure scheduled for 11/22/19. Left VM for them to call and let me know. JM

## 2019-11-22 ENCOUNTER — Other Ambulatory Visit: Payer: Self-pay

## 2019-11-22 ENCOUNTER — Ambulatory Visit (HOSPITAL_COMMUNITY)
Admission: RE | Admit: 2019-11-22 | Discharge: 2019-11-22 | Disposition: A | Discharge status: Home / Self Care | Payer: Medicare Other | Source: Ambulatory Visit | Attending: Diagnostic Radiology | Admitting: Diagnostic Radiology

## 2019-11-22 ENCOUNTER — Other Ambulatory Visit (HOSPITAL_COMMUNITY): Payer: Self-pay | Admitting: Diagnostic Radiology

## 2019-11-22 DIAGNOSIS — W19XXXS Unspecified fall, sequela: Secondary | ICD-10-CM | POA: Insufficient documentation

## 2019-11-22 DIAGNOSIS — S32000S Wedge compression fracture of unspecified lumbar vertebra, sequela: Secondary | ICD-10-CM | POA: Diagnosis not present

## 2019-11-22 DIAGNOSIS — S32010A Wedge compression fracture of first lumbar vertebra, initial encounter for closed fracture: Secondary | ICD-10-CM

## 2019-11-22 HISTORY — PX: IR KYPHO EA ADDL LEVEL THORACIC OR LUMBAR: IMG5520

## 2019-11-22 HISTORY — PX: IR KYPHO LUMBAR INC FX REDUCE BONE BX UNI/BIL CANNULATION INC/IMAGING: IMG5519

## 2019-11-22 LAB — CBC
HCT: 41.2 % (ref 36.0–46.0)
Hemoglobin: 13.4 g/dL (ref 12.0–15.0)
MCH: 30.3 pg (ref 26.0–34.0)
MCHC: 32.5 g/dL (ref 30.0–36.0)
MCV: 93.2 fL (ref 80.0–100.0)
Platelets: 251 10*3/uL (ref 150–400)
RBC: 4.42 MIL/uL (ref 3.87–5.11)
RDW: 14.7 % (ref 11.5–15.5)
WBC: 9.5 10*3/uL (ref 4.0–10.5)
nRBC: 0 % (ref 0.0–0.2)

## 2019-11-22 LAB — BASIC METABOLIC PANEL
Anion gap: 11 (ref 5–15)
BUN: 23 mg/dL (ref 8–23)
CO2: 31 mmol/L (ref 22–32)
Calcium: 9.7 mg/dL (ref 8.9–10.3)
Chloride: 96 mmol/L — ABNORMAL LOW (ref 98–111)
Creatinine, Ser: 0.94 mg/dL (ref 0.44–1.00)
GFR, Estimated: >60 mL/min (ref >60)
Glucose, Bld: 130 mg/dL — ABNORMAL HIGH (ref 70–99)
Potassium: 3 mmol/L — ABNORMAL LOW (ref 3.5–5.1)
Sodium: 138 mmol/L (ref 135–145)

## 2019-11-22 LAB — GLUCOSE, CAPILLARY
Glucose-Capillary: 131 mg/dL — ABNORMAL HIGH (ref 70–99)
Glucose-Capillary: 114 mg/dL — ABNORMAL HIGH (ref 70–99)

## 2019-11-22 LAB — PROTIME-INR
INR: 1 (ref 0.8–1.2)
Prothrombin Time: 12.7 seconds (ref 11.4–15.2)

## 2019-11-22 MED ORDER — SODIUM CHLORIDE 0.9 % IV SOLN: INTRAVENOUS | Status: AC

## 2019-11-22 MED ORDER — FENTANYL CITRATE (PF) 100 MCG/2ML IJ SOLN
INTRAMUSCULAR | Status: AC
  Filled 2019-11-22: qty 2

## 2019-11-22 MED ORDER — CEFAZOLIN SODIUM-DEXTROSE 2-4 GM/100ML-% IV SOLN
INTRAVENOUS | Status: AC
  Administered 2019-11-22: 2 g via INTRAVENOUS
  Filled 2019-11-22: qty 100

## 2019-11-22 MED ORDER — METHOCARBAMOL 500 MG PO TABS
500.0000 mg | ORAL_TABLET | ORAL | Status: AC
  Administered 2019-11-22: 500 mg via ORAL
  Filled 2019-11-22: qty 1

## 2019-11-22 MED ORDER — SODIUM CHLORIDE 0.9 % IV SOLN: INTRAVENOUS | Status: DC

## 2019-11-22 MED ORDER — SODIUM CHLORIDE 0.9 % IV BOLUS
INTRAVENOUS | Status: AC | PRN
  Administered 2019-11-22: 250 mL via INTRAVENOUS

## 2019-11-22 MED ORDER — BUPIVACAINE HCL (PF) 0.5 % IJ SOLN
INTRAMUSCULAR | Status: AC
  Filled 2019-11-22: qty 30

## 2019-11-22 MED ORDER — MIDAZOLAM HCL 2 MG/2ML IJ SOLN
INTRAMUSCULAR | Status: AC | PRN
  Administered 2019-11-22 (×2): 0.5 mg via INTRAVENOUS

## 2019-11-22 MED ORDER — TOBRAMYCIN SULFATE 1.2 G IJ SOLR
INTRAMUSCULAR | Status: AC
  Filled 2019-11-22: qty 1.2

## 2019-11-22 MED ORDER — HYDROCODONE-ACETAMINOPHEN 5-325 MG PO TABS
1.0000 | ORAL_TABLET | Freq: Once | ORAL | Status: AC
  Administered 2019-11-22: 1 via ORAL
  Filled 2019-11-22: qty 1

## 2019-11-22 MED ORDER — CEFAZOLIN SODIUM-DEXTROSE 2-4 GM/100ML-% IV SOLN: 2.0000 g | INTRAVENOUS | Status: AC

## 2019-11-22 MED ORDER — MIDAZOLAM HCL 2 MG/2ML IJ SOLN
INTRAMUSCULAR | Status: AC
  Filled 2019-11-22: qty 2

## 2019-11-22 MED ORDER — BUPIVACAINE HCL (PF) 0.5 % IJ SOLN
INTRAMUSCULAR | Status: AC | PRN
  Administered 2019-11-22: 30 mL

## 2019-11-22 MED ORDER — FENTANYL CITRATE (PF) 100 MCG/2ML IJ SOLN
INTRAMUSCULAR | Status: AC | PRN
  Administered 2019-11-22 (×2): 12.5 ug via INTRAVENOUS

## 2019-11-22 NOTE — Progress Notes (Signed)
States is no longer seeing black dots in vision

## 2019-11-22 NOTE — Progress Notes (Signed)
C/O 8/10 right hip pain and back and leg pain

## 2019-11-22 NOTE — Progress Notes (Signed)
Client refused ice pack to back

## 2019-11-22 NOTE — H&P (Signed)
Chief Complaint: Patient was seen in consultation today for L1 and L2 compression fractures/vertebral augmentation.  Referring Physician(s): Henn,Adam (IR); Corey,Evan S (sports medicine)  Supervising Physician: Luanne Bras  Patient Status: [facility] - Out-pt  History of Present Illness: Nicole [name] is a 79 y.o. female with a past medical history of hypertension, hypertriglyceridemia, CAD, atrial fibrillation/flutter on chronic anticoagulation with Eliquis, headaches, bronchitis, lung cancer (adenocarcinoma), cholelithiasis, diabetes mellitus type II, and OA. Patient unfortunately suffered from a fall in 09/2019, and has had significant back pain since. She is currently residing at [facility] (since 10/26/2019) for rehabilitation secondary to back pain. She was referred to sports medicine for management, who obtained appropiate imaging scans for further evaluation which revealed acute/subacute L1 and L2 compression fractures. She was referred to IR for further management, and met with Dr. Anselm Pancoast on 11/13/2019 to discuss management options. At that time, patient decided to pursue vertebral augmentation as management, and agreed to have procedure done by a partner of Dr. Anselm Pancoast (Dr. Estanislado Pandy) in order to expedite care.  CT lumbar spine 10/24/2019: 1. 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 Height loss measures at the 55%. No retropulsion. 2. L4 remote compression fracture. 3. 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 Foraminal impingement and spinal stenosis are also prominent at L3-4 and L5-S1.  MR lumbar spine 11/09/2019: 1. Acute to subacute compression fracture at L1 with only about 20% loss of height. This is newly apparent compared to the CT lumbar spine from 10/24/2019. 2. Subacute compression fracture of L2 with marrow edema, as shown on recent CT. Remote endplate compression  fractures at L4. 3. Lumbar spondylosis and degenerative disc disease, causing severe impingement at L4-5; mild to moderate impingement at L5-S1; and mild impingement at L4-5, as detailed above. 4. Acute or subacute transverse fracture through the upper S3 vertebral level, included on the sagittal images only.  IR consulted by Dr. Georgina Snell for possible image-guided L1 and L2 kyphoplasty/vertebroplasty. Patient awake and alert laying in bed. Daughter at bedside. Complains of low back pain. States she currently has no pain as she is laying flat. States any movements exacerbate pain, rating pain as 7.5/10 at its worst. Denies fever, chills, chest pain, dyspnea, abdominal pain, urinary symptoms (including frequency or dysuria), or headaches.  LD Eliquis 11/17/2019.   Past Medical History:  Diagnosis Date  . A-fib ([place])   . Adenocarcinoma of right lung, stage 3 ([place]) 07/28/2016  . Atrial flutter ([place]) 10/05/2016  . Bronchitis    hx of  . 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 Cancer-related pain 10/06/2016  . Coronary artery disease 02/21/2017   JAN 2019 Prox RCA lesion is 25% stenosed. Prox Cx lesion is 30% stenosed. Mid Cx lesion is 25% stenosed. Ost 3rd Mrg lesion is 30% stenosed. Prox LAD lesion is 25% stenosed. Mid LAD lesion is 25% stenosed. The left ventricular systolic function is normal. LV end diastolic pressure is normal. The left ventricular ejection fraction is 55-65% by visual estimate. There is no aortic valve stenosis.   N  . Diabetes mellitus    type 2  . Edema 07/12/2017  . Essential hypertension 01/20/2009  . Gallstones   . Headache   . History of radiation therapy 08/09/2016 to 09/19/2016   Right lung was treated to 60 Gy in 30 fractions at 2 Gy per fraction  . Hypertension   . Hypertriglyceridemia 08/22/2012  . Low back pain   .  Lung mass    with lymphadenopathy  . Osteoarthritis   . Type II diabetes mellitus with manifestations ([place]) 01/20/2009   Estimated Creatinine  Clearance: 49.8 mL/min (by C-G formula based on SCr of 1 mg/dL).    Past Surgical History:  Procedure Laterality Date  . ABDOMINAL HYSTERECTOMY  2004  . APPENDECTOMY  when 79 years old  . COLONOSCOPY  08-05-09   [name]  . I & D EXTREMITY Right 04/04/2019   Procedure: IRRIGATION AND DEBRIDEMENT RIGHT HAND;  Surgeon: Verner Mould, MD;  Location: [facility];  Service: Orthopedics;  Laterality: Right;  . INCONTINENCE SURGERY    . IR RADIOLOGIST EVAL & MGMT  11/13/2019  . KNEE ARTHROSCOPY Left   . LEFT HEART CATH AND CORONARY ANGIOGRAPHY N/A 02/24/2017   Procedure: LEFT HEART CATH AND CORONARY ANGIOGRAPHY;  Surgeon: Jettie Booze, MD;  Location: [place] [facility];  Service: Cardiovascular;  Laterality: N/A;  . LEFT HEART CATHETERIZATION WITH CORONARY ANGIOGRAM N/A 07/19/2013   Procedure: LEFT HEART CATHETERIZATION WITH CORONARY ANGIOGRAM;  Surgeon: Sinclair Grooms, MD;  Location: [facility] CATH LAB;  Service: Cardiovascular;  Laterality: N/A;  . POLYPECTOMY  08-05-09   2 polyps  . TOTAL KNEE ARTHROPLASTY Left 03/28/2012   Procedure: LEFT TOTAL KNEE ARTHROPLASTY;  Surgeon: Tobi Bastos, MD;  Location: [facility];  Service: Orthopedics;  Laterality: Left;  . TUBAL LIGATION    . VIDEO BRONCHOSCOPY WITH ENDOBRONCHIAL ULTRASOUND N/A 07/11/2016   Procedure: VIDEO BRONCHOSCOPY WITH ENDOBRONCHIAL ULTRASOUND;  Surgeon: Marshell Garfinkel, MD;  Location: [place];  Service: Pulmonary;  Laterality: N/A;    Allergies: Patient has no known allergies.  Medications: Prior to Admission medications   Medication Sig Start Date End Date Taking? Authorizing Provider  b complex vitamins tablet Take 1 tablet by mouth daily.   Yes [provider]  CALCIUM-VITAMIN D-VITAMIN K PO Take 2 tablets by mouth daily with breakfast. Calcium 500 mg vitamin d 200 unit vitamin k 40 mg    Yes [provider]  Collagenase POWD by Does not apply route daily.    Yes [provider]  diltiazem  (CARDIZEM CD) 360 MG 24 hr capsule TAKE 1 CAPSULE(360 MG) BY MOUTH DAILY Patient taking differently: Take 360 mg by mouth daily.  07/09/19  Yes Jettie Booze, MD  docusate sodium (COLACE) 100 MG capsule Take 100 mg by mouth daily.   Yes [provider]  insulin glargine, 2 Unit Dial, (TOUJEO MAX SOLOSTAR) 300 UNIT/ML Solostar Pen Inject 30 Units into the skin daily. Patient taking differently: Inject 25 Units into the skin daily.  10/14/19  Yes Burns, Claudina Lick, MD  LACTOBACILLUS PO Take 1 capsule by mouth daily. 3 billion cell   Yes [provider]  metFORMIN (GLUCOPHAGE) 500 MG tablet Take 1,000 mg by mouth daily with breakfast.   Yes [provider]  methocarbamol (ROBAXIN) 500 MG tablet Take 500 mg by mouth daily.   Yes [provider]  metoprolol tartrate (LOPRESSOR) 25 MG tablet Take 1 tablet every 8 hours AS NEEDED for rapid heart rate Patient taking differently: Take 25 mg by mouth every 8 (eight) hours as needed (HTN).  08/01/18  Yes Fenton, Clint R, PA  Multiple Vitamins-Minerals (WOMENS MULTIVITAMIN PO) Take 1 tablet by mouth daily.   Yes [provider]  Nutritional Supplement LIQD Take 120 mLs by mouth 3 (three) times daily between meals. MedPass   Yes [provider]  rosuvastatin (CRESTOR) 5 MG tablet  TAKE 1 TABLET(5 MG) BY MOUTH DAILY Patient taking differently: Take 5 mg by mouth daily.  10/07/19  Yes [name], MD  torsemide (DEMADEX) 20 MG tablet Take 1 tablet (20 mg total) by mouth every other day. 08/28/19  Yes Fenton, Clint R, PA  traMADol (ULTRAM) 50 MG tablet Take 25 mg by mouth 2 (two) times daily as needed for moderate pain.   Yes [provider]  acetaminophen (TYLENOL) 325 MG tablet Take 650 mg by mouth every 6 (six) hours as needed for headache.    [provider]  acetaminophen (TYLENOL) 500 MG tablet Take 1,000 mg by mouth 2 (two) times daily.    [provider]  CONTOUR NEXT TEST  test strip USE 1 STRIP TO CHECK GLUCOSE TWICE DAILY 12/25/18   [name], MD  ELIQUIS 5 MG TABS tablet Take 5 mg by mouth 2 (two) times daily.  09/25/19   [provider]  glucose blood test strip Contour Next Test Strips  USE 1 STRIP TO CHECK GLUCOSE TWICE DAILY    [provider]  Insulin Pen Needle (NOVOFINE) 32G X 6 MM MISC 1 Act by Does not apply route daily. 06/17/19   [name], MD  Lancets [facility]) lancets Use to check blood sugars daily Dx E11.9 06/18/19   [name], MD  Lidocaine 4 % PTCH Apply 1 patch topically daily. Apply 1 patch in the morning, remove at night.    [provider]  Lidocaine-Menthol 4-1 % PTCH Apply 1 patch topically daily. Apply to lower back    [provider]  Omega-3 Fatty Acids (OMEGA III EPA+DHA PO) Take 1 capsule by mouth daily. Fish oil 1200 mg, dha 144 mg, epa 216 mg     [provider]  Pollen Extracts (PROSTAT PO) Take 30 mLs by mouth daily.    [provider]  witch hazel-glycerin (TUCKS) pad Apply 1 application topically 3 (three) times daily.    [provider]  irbesartan (AVAPRO) 300 MG tablet Take 1 tablet (300 mg total) by mouth daily. 12/12/18   [name], MD     Family History  Problem Relation Age of Onset  . Heart attack Father   . Heart disease Father   . Arthritis Other   . Cancer Other        colon, lst degree relative  . Diabetes Other        st degree relative  . Hyperlipidemia Other   . Hypertension Other   . Colon cancer Neg Hx     Social History   Socioeconomic History  . Marital status: Widowed    Spouse name: Not on file  . Number of children: 3  . Years of education: Not on file  . Highest education level: Not on file  Occupational History  . Occupation: retired    Fish farm manager: RETIRED  Tobacco Use  . Smoking status: Never Smoker  . Smokeless tobacco: Never Used  Vaping Use  . Vaping Use: Never used  Substance and  Sexual Activity  . Alcohol use: No    Alcohol/week: 0.0 standard drinks  . Drug use: No  . Sexual activity: Not Currently  Other Topics Concern  . Not on file  Social History Narrative   Regular exercise- Yes   Social Determinants of Health   Financial Resource Strain:   . Difficulty of Paying Living Expenses: Not on file  Food Insecurity: No Food Insecurity  .  Worried About Charity fundraiser in the Last Year: Never true  . Ran Out of Food in the Last Year: Never true  Transportation Needs: No Transportation Needs  . Lack of Transportation (Medical): No  . Lack of Transportation (Non-Medical): No  Physical Activity:   . Days of Exercise per Week: Not on file  . Minutes of Exercise per Session: Not on file  Stress:   . Feeling of Stress : Not on file  Social Connections:   . Frequency of Communication with Friends and Family: Not on file  . Frequency of Social Gatherings with Friends and Family: Not on file  . Attends Religious Services: Not on file  . Active Member of Clubs or Organizations: Not on file  . Attends Archivist Meetings: Not on file  . Marital Status: Not on file     Review of Systems: A 12 point ROS discussed and pertinent positives are indicated in the HPI above.  All other systems are negative.  Review of Systems  Constitutional: Negative for chills and fever.  Respiratory: Negative for shortness of breath and wheezing.   Cardiovascular: Negative for chest pain and palpitations.  Gastrointestinal: Negative for abdominal pain.  Genitourinary: Negative for dysuria and frequency.  Musculoskeletal: Positive for back pain.  Neurological: Negative for headaches.  Psychiatric/Behavioral: Negative for behavioral problems and confusion.    Vital Signs: BP 101/75   Pulse 97   Temp 98.2 F (36.8 C) (Oral)   Resp 15   Ht 5' 6"  (1.676 m)   Wt 165 lb (74.8 kg)   SpO2 96%   BMI 26.63 kg/m   Physical Exam Vitals and nursing note reviewed.    Constitutional:      General: She is not in acute distress.    Appearance: Normal appearance.  Cardiovascular:     Rate and Rhythm: Normal rate and regular rhythm.     Heart sounds: Normal heart sounds. No murmur heard.   Pulmonary:     Effort: Pulmonary effort is normal. No respiratory distress.     Breath sounds: Normal breath sounds. No wheezing.  Musculoskeletal:     Comments: Moderate-severe midline low back tenderness.  Skin:    General: Skin is warm and dry.  Neurological:     Mental Status: She is alert and oriented to person, place, and time.      MD Evaluation Airway: WNL Heart: WNL Abdomen: WNL Chest/ Lungs: WNL ASA  Classification: 3 Mallampati/Airway Score: Two   Imaging: CT LUMBAR SPINE WO CONTRAST  Result Date: 10/24/2019 CLINICAL DATA:  Lumbar spine compression fracture EXAM: CT LUMBAR SPINE WITHOUT CONTRAST TECHNIQUE: Multidetector CT imaging of the lumbar spine was performed without intravenous contrast administration. Multiplanar CT image reconstructions were also generated. COMPARISON:  Lumbar radiography 10/11/2019 FINDINGS: Segmentation: Transitional S1 vertebra with rudimentary S1-2 disc space. Alignment: L4-5 degenerative anterolisthesis, grade 1. Vertebrae: Nonacute L2 compression fracture, but definitely unhealed with gas following the branching fracture planes which also show sclerotic lines. L2 height loss measures up to 55%. No retropulsion. L4 remote compression fracture with superior and inferior endplate depression. Paraspinal and other soft tissues: Atheromatous calcification. No perispinal inflammation or mass seen. Disc levels: T12- L1: Spondylolysis. L1-L2: Spondylosis with bridging osteophyte.  No impingement L2-L3: Disc narrowing and bulging. Vacuum phenomenon is present within the disc space. Moderate bilateral foraminal narrowing. L3-L4: Degenerative disc collapse with mainly foraminal and far-lateral bulging. Facet spurring on both sides as  well. Moderate spinal and right foraminal impingement. L4-L5:  Disc collapse with right preferential extrusion severely effacing the right foramen. There is also advanced spinal stenosis. On the left is a spur or high-density synovial cyst impinging on the left L4 nerve root. L5-S1:Asymmetric leftward disc collapse and bulging with facet and endplate spurring. High-grade spinal stenosis. Left foraminal impingement. IMPRESSION: 1. 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 Height loss measures at the 55%. No retropulsion. 2. L4 remote compression fracture. 3. 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 Foraminal impingement and spinal stenosis are also prominent at L3-4 and L5-S1. Electronically Signed   By: Monte Fantasia M.D.   On: 10/24/2019 10:42   MR Lumbar Spine Wo Contrast  Result Date: 11/09/2019 CLINICAL DATA:  Low back pain.  Known compression fracture at L2. EXAM: MRI LUMBAR SPINE WITHOUT CONTRAST TECHNIQUE: Multiplanar, multisequence MR imaging of the lumbar spine was performed. No intravenous contrast was administered. COMPARISON:  Multiple exams, including lumbar spine CT from 10/24/2019 FINDINGS: Segmentation: Mildly transitional S1, but L5 is the lowest fully segmental lumbar type rib-bearing vertebra. Alignment: 4 mm degenerative anterolisthesis at L4-5 and 3 mm degenerative anterolisthesis at L3-4. Vertebrae: Compression fracture at L1 with superior endplate and central anterior incomplete transverse components but only about 20% loss of height. Associated vertebral edema and visible fracture plane transversely in the anterior inferior vertebral body. 50% compression fracture of L2 with marrow edema, similar to that shown on recent CT. Remote endplate compression fractures at L4. At the very bottom of the sagittal images we demonstrate a transverse fracture through the upper S3 vertebral  level (counting the slightly transitional vertebra is S1). Conus medullaris and cauda equina: Conus extends to the L2 level. Conus and cauda equina appear normal. Paraspinal and other soft tissues: Suspected peripelvic cysts on the right. Mild paraspinal edema at the L1-2 level likely related to the known compression fractures. Disc levels: T12-L1: No impingement.  Mild disc bulge. L1-2: No impingement.  Notable right lateral intervertebral spurring L2-3: Borderline bilateral foraminal stenosis primarily from facet arthropathy. L4-5: Mild right foraminal stenosis due to disc bulge, intervertebral spurring, and facet arthropathy. Borderline central narrowing of the thecal sac. L4-5: Prominent central narrowing of the thecal sac with prominent right and moderate left foraminal stenosis due to disc uncovering, disc bulge, broad right paracentral disc protrusion extending into the neural foramen, and facet arthropathy. Cross-sectional area of the thecal sac is narrowed to 0.3 cm^2 at this level and the right disc protrusion extends into the lateral extraforaminal space with displacement of the right L4 spinal nerve. L5-S1: Mild to moderate left foraminal stenosis due to disc bulge and facet spurring. S1-2: No impingement.  Only rudimentary disc material at this level. IMPRESSION: 1. Acute to subacute compression fracture at L1 with only about 20% loss of height. This is newly apparent compared to the CT lumbar spine from 10/24/2019. 2. Subacute compression fracture of L2 with marrow edema, as shown on recent CT. Remote endplate compression fractures at L4. 3. Lumbar spondylosis and degenerative disc disease, causing severe impingement at L4-5; mild to moderate impingement at L5-S1; and mild impingement at L4-5, as detailed above. 4. Acute or subacute transverse fracture through the upper S3 vertebral level, included on the sagittal images only. Electronically Signed   By: Van Clines M.D.   On: 11/09/2019 14:09     IR Radiologist Eval & Mgmt  Result Date: 11/13/2019 Please refer to notes tab for details about interventional procedure. (Op  Note)   Labs:  CBC: Recent Labs    07/15/19 1156 10/21/19 0940 10/25/19 1135 11/22/19 0849  WBC 8.9 9.1 7.4 9.5  HGB 15.4* 13.3 13.6 13.4  HCT 47.1* 40.6 41.7 41.2  PLT 209 233 249 251    COAGS: Recent Labs    04/02/19 1313 11/22/19 0849  INR 1.1 1.0  APTT 29  --     BMP: Recent Labs    07/15/19 1156 08/21/19 1420 10/21/19 0940 10/25/19 1135  NA 140 138 133* 134*  K 4.3 4.1 4.4 3.5  CL 108 102 103 101  CO2 23 24 25 23   GLUCOSE 182* 259* 158* 85  BUN 34* 24* 23 26*  CALCIUM 8.7* 9.0 8.6* 8.6*  CREATININE 1.05* 0.93 1.01* 0.70  GFRNONAA 52* 60* 54* >60  GFRAA 60* >60 >60 >60    LIVER FUNCTION TESTS: Recent Labs    01/10/19 0911 04/02/19 1313 07/15/19 1156 10/21/19 0940  BILITOT 0.7 0.9 0.8 0.6  AST 15 15 26  13*  ALT 19 24 55* 26  ALKPHOS 60 54 67 117  PROT 6.8 5.9* 6.3* 6.0*  ALBUMIN 3.3* 2.9* 3.1* 2.6*     Assessment and Plan:  History of fall with onset of severe low back pain secondary to L1 and L2 compression fractures. Plan for image-guided L1 and L2 kyphoplasty/vertebroplasty today in IR with Dr. Estanislado Pandy. Patient has been NPO since 0730- ok to proceed after 1330 per IR protocol. Afebrile and WBCs WNL. Eliquis held per IR protocol. INR 1.0 today.  Risks and benefits of L1 and L2 kyphoplasty/vertebroplasty were discussed with the patient including, but not limited to education regarding the natural healing process of compression fractures without intervention, bleeding, infection, cement migration which may cause spinal cord damage, paralysis, pulmonary embolism or even death. This interventional procedure involves the use of X-rays and because of the nature of the planned procedure, it is possible that we will have prolonged use of X-ray fluoroscopy. Potential radiation risks to you include (but are not  limited to) the following: - A slightly elevated risk for cancer  several years later in life. This risk is typically less than 0.5% percent. This risk is low in comparison to the normal incidence of human cancer, which is 33% for women and 50% for men according to the [place]. - Radiation induced injury can include skin redness, resembling a rash, tissue breakdown / ulcers and hair loss (which can be temporary or permanent).  The likelihood of either of these occurring depends on the difficulty of the procedure and whether you are sensitive to radiation due to previous procedures, disease, or genetic conditions.  IF your procedure requires a prolonged use of radiation, you will be notified and given written instructions for further action.  It is your responsibility to monitor the irradiated area for the 2 weeks following the procedure and to notify your physician if you are concerned that you have suffered a radiation induced injury.   All of the patient's questions were answered, patient is agreeable to proceed. Consent obtained by patient's daughter, Nicole [name], signed and in chart.   Thank you for this interesting consult.  I greatly enjoyed meeting Nicole [name] and look forward to participating in their care.  A copy of this report was sent to the requesting provider on this date.  Electronically Signed: Earley Abide, PA-C 11/22/2019, 9:55 AM   I spent a total of 40 Minutes in face to face in clinical consultation, greater than  50% of which was counseling/coordinating care for L1 and L2 compression fractures/vertebral augmentation.

## 2019-11-22 NOTE — Progress Notes (Addendum)
Client c/o seeing, "black dots" PA notified;Kevin Bruning,PA in to evaluate client and no new orders

## 2019-11-22 NOTE — Sedation Documentation (Signed)
Attempted to call report to short stay.

## 2019-11-22 NOTE — Progress Notes (Signed)
Called Dr Estanislado Pandy and per Dr Estanislado Pandy client to resume eliquis tomorrow

## 2019-11-22 NOTE — Discharge Instructions (Signed)
    KYPHOPLASTY/VERTEBROPLASTY DISCHARGE INSTRUCTIONS  Medications: (check all that apply)  Resume Eliquis tomorrow              Continue your pain medications as prescribed as needed.  Over the next 3-5 days, decrease your pain medication as tolerated.  Over the counter medications (i.e. Tylenol, ibuprofen, and aleve) may be substituted once severe/moderate pain symptoms have subsided.   Wound Care: - Bandages may be removed the day following your procedure.  You may get your incision wet once bandages are removed.  Bandaids may be used to cover the incisions until scab formation.  Topical ointments are optional.  - If you develop a fever greater than 101 degrees, have increased skin redness at the incision sites or pus-like oozing from incisions occurring within 1 week of the procedure, contact radiology at [phone] or [phone].  - Ice pack to back for 15-20 minutes 2-3 time per day for first 2-3 days post procedure.  The ice will expedite muscle healing and help with the pain from the incisions.   Activity: - Bedrest today with limited activity for 24 hours post procedure.  - No driving for 48 hours.  - Increase your activity as tolerated after bedrest (with assistance if necessary).  - Refrain from any strenuous activity or heavy lifting (greater than 10 lbs.).   Follow up: - Contact radiology at [phone] or [phone] if any questions/concerns.  - A physician assistant from radiology will contact you in approximately 1 week.  - If a biopsy was performed at the time of your procedure, your referring physician should receive the results in usually 2-3 days.        1.No stooping,or bending or lifting more than 10 lbs for 2 weeks. 2.Use walker to ambulate for 2  Weeks. 3.No driving for  2 weeks. 4.RTC PRN 2 weeks

## 2019-11-22 NOTE — Procedures (Signed)
S/P L1 and L2 balloon kyphoplasty. S.Addelynn Batte MD

## 2019-11-23 ENCOUNTER — Encounter: Payer: Self-pay | Admitting: Family Medicine

## 2019-11-25 DIAGNOSIS — G8929 Other chronic pain: Secondary | ICD-10-CM | POA: Diagnosis not present

## 2019-11-25 DIAGNOSIS — M48061 Spinal stenosis, lumbar region without neurogenic claudication: Secondary | ICD-10-CM | POA: Diagnosis not present

## 2019-11-25 DIAGNOSIS — Z9889 Other specified postprocedural states: Secondary | ICD-10-CM | POA: Diagnosis not present

## 2019-11-25 DIAGNOSIS — M549 Dorsalgia, unspecified: Secondary | ICD-10-CM | POA: Diagnosis not present

## 2019-11-26 DIAGNOSIS — R4182 Altered mental status, unspecified: Secondary | ICD-10-CM | POA: Diagnosis not present

## 2019-11-26 DIAGNOSIS — I4891 Unspecified atrial fibrillation: Secondary | ICD-10-CM | POA: Diagnosis not present

## 2019-11-26 DIAGNOSIS — R112 Nausea with vomiting, unspecified: Secondary | ICD-10-CM | POA: Diagnosis not present

## 2019-11-26 DIAGNOSIS — I5032 Chronic diastolic (congestive) heart failure: Secondary | ICD-10-CM | POA: Diagnosis not present

## 2019-11-26 DIAGNOSIS — R441 Visual hallucinations: Secondary | ICD-10-CM | POA: Diagnosis not present

## 2019-11-27 ENCOUNTER — Emergency Department (HOSPITAL_COMMUNITY): Payer: Medicare Other

## 2019-11-27 ENCOUNTER — Emergency Department (HOSPITAL_BASED_OUTPATIENT_CLINIC_OR_DEPARTMENT_OTHER): Payer: Medicare Other

## 2019-11-27 ENCOUNTER — Encounter (HOSPITAL_COMMUNITY): Payer: Self-pay

## 2019-11-27 ENCOUNTER — Inpatient Hospital Stay (HOSPITAL_COMMUNITY): Payer: Medicare Other

## 2019-11-27 ENCOUNTER — Other Ambulatory Visit: Payer: Self-pay

## 2019-11-27 ENCOUNTER — Inpatient Hospital Stay (HOSPITAL_COMMUNITY)
Admission: EM | Admit: 2019-11-27 | Discharge: 2019-12-25 | DRG: 064 | Disposition: E | Payer: Medicare Other | Attending: Internal Medicine | Admitting: Internal Medicine

## 2019-11-27 DIAGNOSIS — Z9114 Patient's other noncompliance with medication regimen: Secondary | ICD-10-CM

## 2019-11-27 DIAGNOSIS — I499 Cardiac arrhythmia, unspecified: Secondary | ICD-10-CM | POA: Diagnosis not present

## 2019-11-27 DIAGNOSIS — M7989 Other specified soft tissue disorders: Secondary | ICD-10-CM

## 2019-11-27 DIAGNOSIS — L89891 Pressure ulcer of other site, stage 1: Secondary | ICD-10-CM | POA: Diagnosis not present

## 2019-11-27 DIAGNOSIS — D6832 Hemorrhagic disorder due to extrinsic circulating anticoagulants: Secondary | ICD-10-CM | POA: Diagnosis not present

## 2019-11-27 DIAGNOSIS — E861 Hypovolemia: Secondary | ICD-10-CM | POA: Diagnosis present

## 2019-11-27 DIAGNOSIS — I361 Nonrheumatic tricuspid (valve) insufficiency: Secondary | ICD-10-CM | POA: Diagnosis not present

## 2019-11-27 DIAGNOSIS — Z9221 Personal history of antineoplastic chemotherapy: Secondary | ICD-10-CM

## 2019-11-27 DIAGNOSIS — I82412 Acute embolism and thrombosis of left femoral vein: Secondary | ICD-10-CM | POA: Diagnosis not present

## 2019-11-27 DIAGNOSIS — I615 Nontraumatic intracerebral hemorrhage, intraventricular: Secondary | ICD-10-CM | POA: Diagnosis not present

## 2019-11-27 DIAGNOSIS — D638 Anemia in other chronic diseases classified elsewhere: Secondary | ICD-10-CM | POA: Diagnosis present

## 2019-11-27 DIAGNOSIS — E1165 Type 2 diabetes mellitus with hyperglycemia: Secondary | ICD-10-CM | POA: Diagnosis present

## 2019-11-27 DIAGNOSIS — I959 Hypotension, unspecified: Secondary | ICD-10-CM | POA: Diagnosis not present

## 2019-11-27 DIAGNOSIS — Z515 Encounter for palliative care: Secondary | ICD-10-CM

## 2019-11-27 DIAGNOSIS — L89151 Pressure ulcer of sacral region, stage 1: Secondary | ICD-10-CM | POA: Diagnosis not present

## 2019-11-27 DIAGNOSIS — I4891 Unspecified atrial fibrillation: Secondary | ICD-10-CM | POA: Diagnosis not present

## 2019-11-27 DIAGNOSIS — I351 Nonrheumatic aortic (valve) insufficiency: Secondary | ICD-10-CM | POA: Diagnosis not present

## 2019-11-27 DIAGNOSIS — I2510 Atherosclerotic heart disease of native coronary artery without angina pectoris: Secondary | ICD-10-CM | POA: Diagnosis present

## 2019-11-27 DIAGNOSIS — A419 Sepsis, unspecified organism: Secondary | ICD-10-CM | POA: Diagnosis not present

## 2019-11-27 DIAGNOSIS — I10 Essential (primary) hypertension: Secondary | ICD-10-CM | POA: Diagnosis present

## 2019-11-27 DIAGNOSIS — G8918 Other acute postprocedural pain: Secondary | ICD-10-CM | POA: Diagnosis present

## 2019-11-27 DIAGNOSIS — I781 Nevus, non-neoplastic: Secondary | ICD-10-CM | POA: Diagnosis present

## 2019-11-27 DIAGNOSIS — E872 Acidosis: Secondary | ICD-10-CM | POA: Diagnosis present

## 2019-11-27 DIAGNOSIS — I609 Nontraumatic subarachnoid hemorrhage, unspecified: Principal | ICD-10-CM

## 2019-11-27 DIAGNOSIS — R6520 Severe sepsis without septic shock: Secondary | ICD-10-CM | POA: Diagnosis not present

## 2019-11-27 DIAGNOSIS — Z7901 Long term (current) use of anticoagulants: Secondary | ICD-10-CM | POA: Diagnosis not present

## 2019-11-27 DIAGNOSIS — Z923 Personal history of irradiation: Secondary | ICD-10-CM

## 2019-11-27 DIAGNOSIS — I482 Chronic atrial fibrillation, unspecified: Secondary | ICD-10-CM | POA: Diagnosis present

## 2019-11-27 DIAGNOSIS — I82452 Acute embolism and thrombosis of left peroneal vein: Secondary | ICD-10-CM | POA: Diagnosis not present

## 2019-11-27 DIAGNOSIS — Z794 Long term (current) use of insulin: Secondary | ICD-10-CM

## 2019-11-27 DIAGNOSIS — R001 Bradycardia, unspecified: Secondary | ICD-10-CM | POA: Diagnosis not present

## 2019-11-27 DIAGNOSIS — R4701 Aphasia: Secondary | ICD-10-CM | POA: Diagnosis not present

## 2019-11-27 DIAGNOSIS — R471 Dysarthria and anarthria: Secondary | ICD-10-CM | POA: Diagnosis not present

## 2019-11-27 DIAGNOSIS — I82442 Acute embolism and thrombosis of left tibial vein: Secondary | ICD-10-CM | POA: Diagnosis not present

## 2019-11-27 DIAGNOSIS — I700 Atherosclerosis of aorta: Secondary | ICD-10-CM | POA: Diagnosis present

## 2019-11-27 DIAGNOSIS — Z4659 Encounter for fitting and adjustment of other gastrointestinal appliance and device: Secondary | ICD-10-CM

## 2019-11-27 DIAGNOSIS — Z90710 Acquired absence of both cervix and uterus: Secondary | ICD-10-CM

## 2019-11-27 DIAGNOSIS — E8809 Other disorders of plasma-protein metabolism, not elsewhere classified: Secondary | ICD-10-CM | POA: Diagnosis present

## 2019-11-27 DIAGNOSIS — Z4682 Encounter for fitting and adjustment of non-vascular catheter: Secondary | ICD-10-CM | POA: Diagnosis not present

## 2019-11-27 DIAGNOSIS — Z96652 Presence of left artificial knee joint: Secondary | ICD-10-CM | POA: Diagnosis present

## 2019-11-27 DIAGNOSIS — H5347 Heteronymous bilateral field defects: Secondary | ICD-10-CM | POA: Diagnosis not present

## 2019-11-27 DIAGNOSIS — E041 Nontoxic single thyroid nodule: Secondary | ICD-10-CM | POA: Diagnosis present

## 2019-11-27 DIAGNOSIS — Z20822 Contact with and (suspected) exposure to covid-19: Secondary | ICD-10-CM | POA: Diagnosis present

## 2019-11-27 DIAGNOSIS — I471 Supraventricular tachycardia: Secondary | ICD-10-CM | POA: Diagnosis not present

## 2019-11-27 DIAGNOSIS — J440 Chronic obstructive pulmonary disease with acute lower respiratory infection: Secondary | ICD-10-CM | POA: Diagnosis present

## 2019-11-27 DIAGNOSIS — I82409 Acute embolism and thrombosis of unspecified deep veins of unspecified lower extremity: Secondary | ICD-10-CM

## 2019-11-27 DIAGNOSIS — R52 Pain, unspecified: Secondary | ICD-10-CM | POA: Diagnosis not present

## 2019-11-27 DIAGNOSIS — I159 Secondary hypertension, unspecified: Secondary | ICD-10-CM | POA: Diagnosis not present

## 2019-11-27 DIAGNOSIS — N179 Acute kidney failure, unspecified: Secondary | ICD-10-CM | POA: Diagnosis present

## 2019-11-27 DIAGNOSIS — E785 Hyperlipidemia, unspecified: Secondary | ICD-10-CM | POA: Diagnosis present

## 2019-11-27 DIAGNOSIS — E871 Hypo-osmolality and hyponatremia: Secondary | ICD-10-CM | POA: Diagnosis not present

## 2019-11-27 DIAGNOSIS — I82432 Acute embolism and thrombosis of left popliteal vein: Secondary | ICD-10-CM | POA: Diagnosis not present

## 2019-11-27 DIAGNOSIS — Z79891 Long term (current) use of opiate analgesic: Secondary | ICD-10-CM

## 2019-11-27 DIAGNOSIS — Z8542 Personal history of malignant neoplasm of other parts of uterus: Secondary | ICD-10-CM

## 2019-11-27 DIAGNOSIS — I824Z2 Acute embolism and thrombosis of unspecified deep veins of left distal lower extremity: Secondary | ICD-10-CM | POA: Diagnosis not present

## 2019-11-27 DIAGNOSIS — Z79899 Other long term (current) drug therapy: Secondary | ICD-10-CM

## 2019-11-27 DIAGNOSIS — R609 Edema, unspecified: Secondary | ICD-10-CM

## 2019-11-27 DIAGNOSIS — E876 Hypokalemia: Secondary | ICD-10-CM | POA: Diagnosis present

## 2019-11-27 DIAGNOSIS — Z8249 Family history of ischemic heart disease and other diseases of the circulatory system: Secondary | ICD-10-CM

## 2019-11-27 DIAGNOSIS — Z66 Do not resuscitate: Secondary | ICD-10-CM | POA: Diagnosis not present

## 2019-11-27 DIAGNOSIS — R591 Generalized enlarged lymph nodes: Secondary | ICD-10-CM | POA: Diagnosis present

## 2019-11-27 DIAGNOSIS — Z85118 Personal history of other malignant neoplasm of bronchus and lung: Secondary | ICD-10-CM

## 2019-11-27 DIAGNOSIS — G9340 Encephalopathy, unspecified: Secondary | ICD-10-CM | POA: Diagnosis not present

## 2019-11-27 DIAGNOSIS — R4182 Altered mental status, unspecified: Secondary | ICD-10-CM | POA: Diagnosis not present

## 2019-11-27 DIAGNOSIS — L89311 Pressure ulcer of right buttock, stage 1: Secondary | ICD-10-CM | POA: Diagnosis not present

## 2019-11-27 DIAGNOSIS — K644 Residual hemorrhoidal skin tags: Secondary | ICD-10-CM | POA: Diagnosis present

## 2019-11-27 DIAGNOSIS — Z9889 Other specified postprocedural states: Secondary | ICD-10-CM | POA: Diagnosis not present

## 2019-11-27 DIAGNOSIS — Z7984 Long term (current) use of oral hypoglycemic drugs: Secondary | ICD-10-CM

## 2019-11-27 DIAGNOSIS — L8990 Pressure ulcer of unspecified site, unspecified stage: Secondary | ICD-10-CM | POA: Insufficient documentation

## 2019-11-27 DIAGNOSIS — R0902 Hypoxemia: Secondary | ICD-10-CM | POA: Diagnosis not present

## 2019-11-27 DIAGNOSIS — T45515A Adverse effect of anticoagulants, initial encounter: Secondary | ICD-10-CM | POA: Diagnosis not present

## 2019-11-27 DIAGNOSIS — I340 Nonrheumatic mitral (valve) insufficiency: Secondary | ICD-10-CM | POA: Diagnosis not present

## 2019-11-27 DIAGNOSIS — I82402 Acute embolism and thrombosis of unspecified deep veins of left lower extremity: Secondary | ICD-10-CM | POA: Diagnosis not present

## 2019-11-27 DIAGNOSIS — G9341 Metabolic encephalopathy: Secondary | ICD-10-CM | POA: Diagnosis present

## 2019-11-27 DIAGNOSIS — R197 Diarrhea, unspecified: Secondary | ICD-10-CM | POA: Diagnosis not present

## 2019-11-27 DIAGNOSIS — R404 Transient alteration of awareness: Secondary | ICD-10-CM | POA: Diagnosis not present

## 2019-11-27 LAB — CK: Total CK: 74 U/L (ref 38–234)

## 2019-11-27 LAB — CBC WITH DIFFERENTIAL/PLATELET
Abs Immature Granulocytes: 0.14 10*3/uL — ABNORMAL HIGH (ref 0.00–0.07)
Basophils Absolute: 0 10*3/uL (ref 0.0–0.1)
Basophils Relative: 0 %
Eosinophils Absolute: 0 10*3/uL (ref 0.0–0.5)
Eosinophils Relative: 0 %
HCT: 39.1 % (ref 36.0–46.0)
Hemoglobin: 12.2 g/dL (ref 12.0–15.0)
Immature Granulocytes: 1 %
Lymphocytes Relative: 4 %
Lymphs Abs: 0.6 10*3/uL — ABNORMAL LOW (ref 0.7–4.0)
MCH: 30.1 pg (ref 26.0–34.0)
MCHC: 31.2 g/dL (ref 30.0–36.0)
MCV: 96.5 fL (ref 80.0–100.0)
Monocytes Absolute: 1.4 10*3/uL — ABNORMAL HIGH (ref 0.1–1.0)
Monocytes Relative: 10 %
Neutro Abs: 11.6 10*3/uL — ABNORMAL HIGH (ref 1.7–7.7)
Neutrophils Relative %: 85 %
Platelets: 300 10*3/uL (ref 150–400)
RBC: 4.05 MIL/uL (ref 3.87–5.11)
RDW: 14.8 % (ref 11.5–15.5)
WBC: 13.7 10*3/uL — ABNORMAL HIGH (ref 4.0–10.5)
nRBC: 0 % (ref 0.0–0.2)

## 2019-11-27 LAB — BLOOD GAS, VENOUS
Acid-base deficit: 2.7 mmol/L — ABNORMAL HIGH (ref 0.0–2.0)
Bicarbonate: 20.6 mmol/L (ref 20.0–28.0)
Drawn by: 1437
FIO2: 28
O2 Saturation: 86.3 %
Patient temperature: 36.5
pCO2, Ven: 28.8 mmHg — ABNORMAL LOW (ref 44.0–60.0)
pH, Ven: 7.466 — ABNORMAL HIGH (ref 7.250–7.430)
pO2, Ven: 50 mmHg — ABNORMAL HIGH (ref 32.0–45.0)

## 2019-11-27 LAB — COMPREHENSIVE METABOLIC PANEL
ALT: 12 U/L (ref 0–44)
AST: 21 U/L (ref 15–41)
Albumin: 2.6 g/dL — ABNORMAL LOW (ref 3.5–5.0)
Alkaline Phosphatase: 63 U/L (ref 38–126)
Anion gap: 20 — ABNORMAL HIGH (ref 5–15)
BUN: 30 mg/dL — ABNORMAL HIGH (ref 8–23)
CO2: 16 mmol/L — ABNORMAL LOW (ref 22–32)
Calcium: 8.9 mg/dL (ref 8.9–10.3)
Chloride: 98 mmol/L (ref 98–111)
Creatinine, Ser: 1.63 mg/dL — ABNORMAL HIGH (ref 0.44–1.00)
GFR, Estimated: 32 mL/min — ABNORMAL LOW (ref >60)
Glucose, Bld: 261 mg/dL — ABNORMAL HIGH (ref 70–99)
Potassium: 4.5 mmol/L (ref 3.5–5.1)
Sodium: 134 mmol/L — ABNORMAL LOW (ref 135–145)
Total Bilirubin: 1.6 mg/dL — ABNORMAL HIGH (ref 0.3–1.2)
Total Protein: 5.9 g/dL — ABNORMAL LOW (ref 6.5–8.1)

## 2019-11-27 LAB — CBG MONITORING, ED: Glucose-Capillary: 245 mg/dL — ABNORMAL HIGH (ref 70–99)

## 2019-11-27 LAB — URINALYSIS, ROUTINE W REFLEX MICROSCOPIC
Glucose, UA: NEGATIVE mg/dL
Ketones, ur: 40 mg/dL — AB
Nitrite: NEGATIVE
Protein, ur: NEGATIVE mg/dL
Specific Gravity, Urine: 1.025 (ref 1.005–1.030)
pH: 5.5 (ref 5.0–8.0)

## 2019-11-27 LAB — RESPIRATORY PANEL BY RT PCR (FLU A&B, COVID)
Influenza A by PCR: NEGATIVE
Influenza B by PCR: NEGATIVE
SARS Coronavirus 2 by RT PCR: NEGATIVE

## 2019-11-27 LAB — URINALYSIS, MICROSCOPIC (REFLEX)

## 2019-11-27 LAB — HEMOGLOBIN A1C
Hgb A1c MFr Bld: 6.4 % — ABNORMAL HIGH (ref 4.8–5.6)
Mean Plasma Glucose: 136.98 mg/dL

## 2019-11-27 LAB — PROTIME-INR
INR: 1.3 — ABNORMAL HIGH (ref 0.8–1.2)
Prothrombin Time: 15.9 seconds — ABNORMAL HIGH (ref 11.4–15.2)

## 2019-11-27 LAB — LACTIC ACID, PLASMA
Lactic Acid, Venous: 2.7 mmol/L (ref 0.5–1.9)
Lactic Acid, Venous: 2.2 mmol/L (ref 0.5–1.9)

## 2019-11-27 LAB — APTT: aPTT: 32 seconds (ref 24–36)

## 2019-11-27 MED ORDER — DOCUSATE SODIUM 100 MG PO CAPS
100.0000 mg | ORAL_CAPSULE | Freq: Every day | ORAL | Status: DC
  Administered 2019-11-28: 100 mg via ORAL
  Filled 2019-11-27: qty 1

## 2019-11-27 MED ORDER — COLLAGENASE POWD: 1.0000 "application " | Freq: Every day | Status: DC | PRN

## 2019-11-27 MED ORDER — DILTIAZEM HCL-DEXTROSE 125-5 MG/125ML-% IV SOLN (PREMIX): 5.0000 mg/h | INTRAVENOUS | Status: DC

## 2019-11-27 MED ORDER — RISAQUAD PO CAPS
1.0000 | ORAL_CAPSULE | Freq: Every day | ORAL | Status: DC
  Administered 2019-11-28: 1 via ORAL
  Filled 2019-11-27 (×2): qty 1

## 2019-11-27 MED ORDER — OMEGA III EPA+DHA 1000 MG PO CAPS: ORAL_CAPSULE | Freq: Every day | ORAL | Status: DC

## 2019-11-27 MED ORDER — DILTIAZEM LOAD VIA INFUSION: 10.0000 mg | Freq: Once | INTRAVENOUS | Status: DC

## 2019-11-27 MED ORDER — ONDANSETRON HCL 4 MG PO TABS: 4.0000 mg | ORAL_TABLET | Freq: Four times a day (QID) | ORAL | Status: DC | PRN

## 2019-11-27 MED ORDER — INSULIN ASPART 100 UNIT/ML ~~LOC~~ SOLN
0.0000 [IU] | Freq: Three times a day (TID) | SUBCUTANEOUS | Status: DC
  Administered 2019-11-27: 3 [IU] via SUBCUTANEOUS
  Administered 2019-11-28 (×2): 2 [IU] via SUBCUTANEOUS

## 2019-11-27 MED ORDER — LIDOCAINE-MENTHOL 4-1 % EX PTCH: 1.0000 | MEDICATED_PATCH | Freq: Every day | CUTANEOUS | Status: DC

## 2019-11-27 MED ORDER — ACETAMINOPHEN 650 MG RE SUPP: 650.0000 mg | Freq: Four times a day (QID) | RECTAL | Status: DC | PRN

## 2019-11-27 MED ORDER — HEPARIN (PORCINE) 25000 UT/250ML-% IV SOLN
1200.0000 [IU]/h | INTRAVENOUS | Status: DC
  Administered 2019-11-27 (×2): 1200 [IU]/h via INTRAVENOUS
  Filled 2019-11-27: qty 250

## 2019-11-27 MED ORDER — ONDANSETRON HCL 4 MG/2ML IJ SOLN
4.0000 mg | Freq: Four times a day (QID) | INTRAMUSCULAR | Status: DC | PRN
  Administered 2019-12-06: 4 mg via INTRAVENOUS
  Filled 2019-11-27: qty 2

## 2019-11-27 MED ORDER — HYDROMORPHONE HCL 1 MG/ML IJ SOLN
0.5000 mg | INTRAMUSCULAR | Status: DC | PRN
  Administered 2019-11-27 – 2019-11-28 (×3): 1 mg via INTRAVENOUS
  Filled 2019-11-27 (×3): qty 1

## 2019-11-27 MED ORDER — VITAMIN B-12 1000 MCG PO TABS
1000.0000 ug | ORAL_TABLET | Freq: Every day | ORAL | Status: DC
  Administered 2019-11-28: 1000 ug via ORAL
  Filled 2019-11-27: qty 1

## 2019-11-27 MED ORDER — LACTATED RINGERS IV SOLN: INTRAVENOUS | Status: AC

## 2019-11-27 MED ORDER — LACTATED RINGERS IV BOLUS (SEPSIS)
1000.0000 mL | Freq: Once | INTRAVENOUS | Status: AC
  Administered 2019-11-27: 1000 mL via INTRAVENOUS

## 2019-11-27 MED ORDER — APIXABAN 5 MG PO TABS
5.0000 mg | ORAL_TABLET | Freq: Two times a day (BID) | ORAL | Status: DC
  Administered 2019-11-27 – 2019-11-28 (×2): 5 mg via ORAL
  Filled 2019-11-27 (×2): qty 1

## 2019-11-27 MED ORDER — METHOCARBAMOL 500 MG PO TABS
500.0000 mg | ORAL_TABLET | Freq: Every day | ORAL | Status: DC
  Administered 2019-11-28: 500 mg via ORAL
  Filled 2019-11-27: qty 1

## 2019-11-27 MED ORDER — DILTIAZEM HCL-DEXTROSE 125-5 MG/125ML-% IV SOLN (PREMIX)
5.0000 mg/h | INTRAVENOUS | Status: DC
  Administered 2019-11-27: 5 mg/h via INTRAVENOUS
  Administered 2019-11-27 – 2019-11-28 (×2): 15 mg/h via INTRAVENOUS
  Filled 2019-11-27 (×4): qty 125

## 2019-11-27 MED ORDER — SODIUM CHLORIDE 0.9 % IV SOLN
2.0000 g | Freq: Two times a day (BID) | INTRAVENOUS | Status: DC
  Administered 2019-11-27 – 2019-11-28 (×3): 2 g via INTRAVENOUS
  Filled 2019-11-27 (×3): qty 2

## 2019-11-27 MED ORDER — DILTIAZEM LOAD VIA INFUSION
15.0000 mg | Freq: Once | INTRAVENOUS | Status: AC
  Administered 2019-11-27: 15 mg via INTRAVENOUS
  Filled 2019-11-27: qty 15

## 2019-11-27 MED ORDER — ONDANSETRON HCL 4 MG/2ML IJ SOLN
4.0000 mg | Freq: Once | INTRAMUSCULAR | Status: AC
  Administered 2019-11-27: 4 mg via INTRAVENOUS
  Filled 2019-11-27: qty 2

## 2019-11-27 MED ORDER — ADULT MULTIVITAMIN W/MINERALS CH
1.0000 | ORAL_TABLET | Freq: Every day | ORAL | Status: DC
  Administered 2019-11-28: 1 via ORAL
  Filled 2019-11-27: qty 1

## 2019-11-27 MED ORDER — WITCH HAZEL-GLYCERIN EX PADS
1.0000 "application " | MEDICATED_PAD | Freq: Three times a day (TID) | CUTANEOUS | Status: DC
  Administered 2019-11-28 – 2019-12-06 (×23): 1 via TOPICAL
  Filled 2019-11-27: qty 100

## 2019-11-27 MED ORDER — SODIUM CHLORIDE 0.9 % IV BOLUS: 1000.0000 mL | Freq: Once | INTRAVENOUS | Status: DC

## 2019-11-27 MED ORDER — METOPROLOL TARTRATE 5 MG/5ML IV SOLN
5.0000 mg | Freq: Once | INTRAVENOUS | Status: AC
  Administered 2019-11-27: 5 mg via INTRAVENOUS
  Filled 2019-11-27: qty 5

## 2019-11-27 MED ORDER — METOPROLOL TARTRATE 50 MG PO TABS
50.0000 mg | ORAL_TABLET | Freq: Two times a day (BID) | ORAL | Status: DC
  Administered 2019-11-27 – 2019-11-28 (×2): 50 mg via ORAL
  Filled 2019-11-27 (×2): qty 1

## 2019-11-27 MED ORDER — ROSUVASTATIN CALCIUM 5 MG PO TABS
5.0000 mg | ORAL_TABLET | Freq: Every day | ORAL | Status: DC
  Administered 2019-11-28 – 2019-11-30 (×2): 5 mg via ORAL
  Filled 2019-11-27 (×2): qty 1

## 2019-11-27 MED ORDER — MORPHINE SULFATE (PF) 2 MG/ML IV SOLN
1.0000 mg | Freq: Once | INTRAVENOUS | Status: AC
  Administered 2019-11-27: 1 mg via INTRAVENOUS
  Filled 2019-11-27: qty 1

## 2019-11-27 MED ORDER — ACETAMINOPHEN 325 MG PO TABS
650.0000 mg | ORAL_TABLET | Freq: Four times a day (QID) | ORAL | Status: DC | PRN
  Filled 2019-11-27 (×2): qty 2

## 2019-11-27 MED ORDER — SODIUM CHLORIDE 0.9 % IV SOLN
2.0000 g | Freq: Once | INTRAVENOUS | Status: AC
  Administered 2019-11-27: 2 g via INTRAVENOUS
  Filled 2019-11-27: qty 2

## 2019-11-27 MED ORDER — LIDOCAINE 5 % EX PTCH
1.0000 | MEDICATED_PATCH | Freq: Every day | CUTANEOUS | Status: DC
  Administered 2019-11-28 – 2019-12-06 (×8): 1 via TRANSDERMAL
  Filled 2019-11-27 (×10): qty 1

## 2019-11-27 MED ORDER — INSULIN ASPART 100 UNIT/ML ~~LOC~~ SOLN
0.0000 [IU] | Freq: Every day | SUBCUTANEOUS | Status: DC
  Administered 2019-11-28: 2 [IU] via SUBCUTANEOUS

## 2019-11-27 MED ORDER — IPRATROPIUM BROMIDE 0.02 % IN SOLN
0.5000 mg | Freq: Four times a day (QID) | RESPIRATORY_TRACT | Status: DC
  Administered 2019-11-27 – 2019-11-28 (×3): 0.5 mg via RESPIRATORY_TRACT
  Filled 2019-11-27 (×4): qty 2.5

## 2019-11-27 MED ORDER — HEPARIN BOLUS VIA INFUSION
2000.0000 [IU] | Freq: Once | INTRAVENOUS | Status: AC
  Administered 2019-11-27: 2000 [IU] via INTRAVENOUS
  Filled 2019-11-27: qty 2000

## 2019-11-27 NOTE — Progress Notes (Signed)
Pharmacy Antibiotic Note  Nicole [name] is a 79 y.o. female admitted on 12/23/2019 with UTI.  Pharmacy has been consulted for Cefepime dosing.  WBC wnl. SCr wnl  Plan: -Start Cefepime 2 gm IV Q 12 hours  -Monitor renal fx and cultures      Temp (24hrs), Avg:100.2 F (37.9 C), Min:100.2 F (37.9 C), Max:100.2 F (37.9 C)  Recent Labs  Lab 11/22/19 0849  WBC 9.5  CREATININE 0.94    Estimated Creatinine Clearance: 51.8 mL/min (by C-G formula based on SCr of 0.94 mg/dL).    No Known Allergies  Antimicrobials this admission: Cefepime 11/3 >>   Dose adjustments this admission:   Microbiology results: 11/3 BCx:  11/3 UCx:     Thank you for allowing pharmacy to be a part of this patient's care.  Albertina Parr, PharmD., BCPS, BCCCP Clinical Pharmacist Please refer to [facility] for unit-specific pharmacist

## 2019-11-27 NOTE — Progress Notes (Signed)
   11/26/2019 2020  Assess: MEWS Score  Temp 97.7 F (36.5 C)  BP 135/90  Pulse Rate (!) 118  ECG Heart Rate (!) 126  Resp 19  Level of Consciousness Alert  SpO2 92 %  O2 Device Nasal Cannula  O2 Flow Rate (L/min) 2 L/min  Assess: MEWS Score  MEWS Temp 0  MEWS Systolic 0  MEWS Pulse 2  MEWS RR 0  MEWS LOC 0  MEWS Score 2  MEWS Score Color Yellow  Assess: if the MEWS score is Yellow or Red  Were vital signs taken at a resting state? Yes  Focused Assessment No change from prior assessment  Early Detection of Sepsis Score *See Row Information* High  MEWS guidelines implemented *See Row Information* Yes  Treat  MEWS Interventions Administered scheduled meds/treatments;Administered prn meds/treatments  Pain Scale 0-10  Pain Score 10  Pain Type Acute pain  Pain Location Back  Pain Orientation Right;Posterior  Pain Descriptors / Indicators Constant;Tender;Throbbing  Pain Frequency Constant  Pain Onset On-going  Patients Stated Pain Goal 2  Pain Intervention(s) Medication (See eMAR)  Take Vital Signs  Increase Vital Sign Frequency  Yellow: Q 2hr X 2 then Q 4hr X 2, if remains yellow, continue Q 4hrs  Escalate  MEWS: Escalate Yellow: discuss with charge nurse/RN and consider discussing with provider and RRT  Notify: Charge Nurse/RN  Name of Charge Nurse/RN Notified Deborah, RN  Date Charge Nurse/RN Notified 12/22/2019  Time Charge Nurse/RN Notified 2025  Notify: Rapid Response  Name of Rapid Response RN Notified Mindy, RN  Date Rapid Response Notified 12/22/2019  Time Rapid Response Notified 2045  Document  Patient Outcome Stabilized after interventions  Progress note created (see row info) Yes  Elaina Hoops, RN

## 2019-11-27 NOTE — Sepsis Progress Note (Signed)
Notified bedside nurse of need to draw repeat lactic acid. 

## 2019-11-27 NOTE — H&P (Signed)
History and Physical    Nicole [name] EQA:[id number] DOB: March 15, 1940 DOA: 12/15/2019  PCP: [name], MD (Confirm with patient/family/NH records and if not entered, this has to be entered at [facility] point of entry) Patient coming from: [facility]  I have personally briefly reviewed patient's old medical records in [place]  Chief Complaint: AMS  HPI: Nicole [name] is a 79 y.o. female with medical history significant of recently diagnosed DVT on Eliquis, chronic lumbar vertebral compression fracture status post recent kyphoplasty, chronic A. fib, hypertension, remote lung cancer, IDDM, HLD, presented with altered mental status.  Patient remained confused at bedside, most history per the patient's son at bedside.  Patient was diagnosed with DVT about 2 weeks ago, and son reported that patient has not been compliant with Eliquis supposedly for her A. fib, and about 2 weeks ago was found to have acute DVT.  Eliquis was restarted at nursing home and on Friday patient went for lumbar vertebral kyphoplasty.  After surgery, patient has had heartburn to cope with the pain meds which is tramadol alternate with Vicodin.  Patient lost her appetite and became very nauseous over the weekend and has had frequent vomiting on weekend, has poor oral intake since then.  Patient denied any abdominal pain, no diarrhea no dysuria.  No fever or chills.  Today patient was found to have a fever 102, and hypoxia on 4 L oxygen, nursing home somewhat suspect patient has UTI and send urine sample.  When EMS arrived it was found patient was in rapid A. fib with heart rate 176, patient was given 10 mg Cardizem IV push and 500 normal saline. ED Course: Blood pressure maintained, heart rate in rapid A. Fib.  UA showed no significant UTI, blood work WBC 13.7, lactic acid 2.7>2.2.  Checks x-ray suspect  Right lung congestion.  DVT study positive for left leg appears to be acute.  Review of Systems: Unable to perform  patient confused  Past Medical History:  Diagnosis Date  . A-fib ([place])   . Adenocarcinoma of right lung, stage 3 ([place]) 07/28/2016  . Atrial flutter ([place]) 10/05/2016  . Bronchitis    hx of  . Cancer [facility]) 2004   uterine/cervical  . Cancer-related pain 10/06/2016  . Coronary artery disease 02/21/2017   JAN 2019 Prox RCA lesion is 25% stenosed. Prox Cx lesion is 30% stenosed. Mid Cx lesion is 25% stenosed. Ost 3rd Mrg lesion is 30% stenosed. Prox LAD lesion is 25% stenosed. Mid LAD lesion is 25% stenosed. The left ventricular systolic function is normal. LV end diastolic pressure is normal. The left ventricular ejection fraction is 55-65% by visual estimate. There is no aortic valve stenosis.   N  . Diabetes mellitus    type 2  . Edema 07/12/2017  . Essential hypertension 01/20/2009  . Gallstones   . Headache   . History of radiation therapy 08/09/2016 to 09/19/2016   Right lung was treated to 60 Gy in 30 fractions at 2 Gy per fraction  . Hypertension   . Hypertriglyceridemia 08/22/2012  . Low back pain   . Lung mass    with lymphadenopathy  . Osteoarthritis   . Type II diabetes mellitus with manifestations ([place]) 01/20/2009   Estimated Creatinine Clearance: 49.8 mL/min (by C-G formula based on SCr of 1 mg/dL).    Past Surgical History:  Procedure Laterality Date  . ABDOMINAL HYSTERECTOMY  2004  . APPENDECTOMY  when 79 years old  . COLONOSCOPY  08-05-09   [name]  . I & D EXTREMITY Right 04/04/2019   Procedure: IRRIGATION AND DEBRIDEMENT RIGHT HAND;  Surgeon: Verner Mould, MD;  Location: [facility];  Service: Orthopedics;  Laterality: Right;  . INCONTINENCE SURGERY    . IR KYPHO EA ADDL LEVEL THORACIC OR LUMBAR  11/22/2019  . IR KYPHO LUMBAR INC FX REDUCE BONE BX UNI/BIL CANNULATION INC/IMAGING  11/22/2019  . IR RADIOLOGIST EVAL & MGMT  11/13/2019  . KNEE ARTHROSCOPY Left   . LEFT HEART CATH AND CORONARY ANGIOGRAPHY N/A 02/24/2017   Procedure: LEFT HEART CATH AND CORONARY  ANGIOGRAPHY;  Surgeon: Jettie Booze, MD;  Location: [place] CV LAB;  Service: Cardiovascular;  Laterality: N/A;  . LEFT HEART CATHETERIZATION WITH CORONARY ANGIOGRAM N/A 07/19/2013   Procedure: LEFT HEART CATHETERIZATION WITH CORONARY ANGIOGRAM;  Surgeon: Sinclair Grooms, MD;  Location: [facility] CATH LAB;  Service: Cardiovascular;  Laterality: N/A;  . POLYPECTOMY  08-05-09   2 polyps  . TOTAL KNEE ARTHROPLASTY Left 03/28/2012   Procedure: LEFT TOTAL KNEE ARTHROPLASTY;  Surgeon: Tobi Bastos, MD;  Location: [facility];  Service: Orthopedics;  Laterality: Left;  . TUBAL LIGATION    . VIDEO BRONCHOSCOPY WITH ENDOBRONCHIAL ULTRASOUND N/A 07/11/2016   Procedure: VIDEO BRONCHOSCOPY WITH ENDOBRONCHIAL ULTRASOUND;  Surgeon: Marshell Garfinkel, MD;  Location: [place];  Service: Pulmonary;  Laterality: N/A;     reports that she has never smoked. She has never used smokeless tobacco. She reports that she does not drink alcohol and does not use drugs.  No Known Allergies  Family History  Problem Relation Age of Onset  . Heart attack Father   . Heart disease Father   . Arthritis Other   . Cancer Other        colon, lst degree relative  . Diabetes Other        st degree relative  . Hyperlipidemia Other   . Hypertension Other   . Colon cancer Neg Hx      Prior to Admission medications   Medication Sig Start Date End Date Taking? Authorizing Provider  acetaminophen (TYLENOL) 325 MG tablet Take 650 mg by mouth every 6 (six) hours as needed for headache.   Yes [provider]  b complex vitamins tablet Take 1 tablet by mouth daily.   Yes [provider]  CALCIUM-VITAMIN D-VITAMIN K PO Take 2 tablets by mouth daily with breakfast. Calcium 500 mg vitamin d 200 unit vitamin k 40 mg    Yes [provider]  Collagenase POWD Apply 1 application topically daily as needed (skin care).    Yes [provider]  diltiazem (CARDIZEM CD) 360 MG 24 hr capsule TAKE 1 CAPSULE(360  MG) BY MOUTH DAILY Patient taking differently: Take 360 mg by mouth daily.  07/09/19  Yes Jettie Booze, MD  docusate sodium (COLACE) 100 MG capsule Take 100 mg by mouth daily.   Yes [provider]  ELIQUIS 5 MG TABS tablet Take 5 mg by mouth 2 (two) times daily.  09/25/19  Yes [provider]  insulin glargine, 2 Unit Dial, (TOUJEO MAX SOLOSTAR) 300 UNIT/ML Solostar Pen Inject 30 Units into the skin daily. Patient taking differently: Inject 20 Units into the skin daily.  10/14/19  Yes Burns, Claudina Lick, MD  LACTOBACILLUS PO Take 1 capsule by mouth daily. 3 billion cell   Yes [provider]  Lidocaine 4 % PTCH Apply 1 patch topically daily. Apply 1 patch in the morning,  remove at night.   Yes [provider]  Lidocaine-Menthol 4-1 % PTCH Apply 1 patch topically daily. Apply to lower back   Yes [provider]  metFORMIN (GLUCOPHAGE) 500 MG tablet Take 1,000 mg by mouth daily with breakfast.   Yes [provider]  methocarbamol (ROBAXIN) 500 MG tablet Take 500 mg by mouth daily.   Yes [provider]  metoprolol tartrate (LOPRESSOR) 25 MG tablet Take 1 tablet every 8 hours AS NEEDED for rapid heart rate Patient taking differently: Take 25 mg by mouth every 8 (eight) hours as needed (hypertention).  08/01/18  Yes Fenton, Clint R, PA  Multiple Vitamins-Minerals (WOMENS MULTIVITAMIN PO) Take 1 tablet by mouth daily.   Yes [provider]  Omega-3 Fatty Acids (OMEGA III EPA+DHA PO) Take 1 capsule by mouth daily. Fish oil 1200 mg, dha 144 mg, epa 216 mg    Yes [provider]  rosuvastatin (CRESTOR) 5 MG tablet TAKE 1 TABLET(5 MG) BY MOUTH DAILY Patient taking differently: TAKE 1 TABLET(5 MG) BY MOUTH DAILY 10/07/19  Yes [name], MD  torsemide (DEMADEX) 20 MG tablet Take 1 tablet (20 mg total) by mouth every other day. Patient taking differently: Take 20 mg by mouth daily.  08/28/19  Yes Fenton, Clint R, PA  traMADol  (ULTRAM) 50 MG tablet Take 25 mg by mouth 2 (two) times daily as needed for moderate pain.   Yes [provider]  witch hazel-glycerin (TUCKS) pad Apply 1 application topically 3 (three) times daily.   Yes [provider]  CONTOUR NEXT TEST test strip USE 1 STRIP TO CHECK GLUCOSE TWICE DAILY 12/25/18   [name], MD  glucose blood test strip Contour Next Test Strips  USE 1 STRIP TO CHECK GLUCOSE TWICE DAILY    [provider]  Insulin Pen Needle (NOVOFINE) 32G X 6 MM MISC 1 Act by Does not apply route daily. 06/17/19   [name], MD  Lancets (ACCU-CHEK SOFT [facility]) lancets Use to check blood sugars daily Dx E11.9 06/18/19   [name], MD  irbesartan (AVAPRO) 300 MG tablet Take 1 tablet (300 mg total) by mouth daily. 12/12/18   [name], MD    Physical Exam: Vitals:   12/05/2019 1215 11/28/2019 1230 12/05/2019 1245 12/06/2019 1300  BP: 127/83 (!) 141/102 (!) 168/91 131/85  Pulse: 90 (!) 102 (!) 105 (!) 120  Resp: 17 (!) 24 (!) 22 (!) 21  Temp:      TempSrc:      SpO2: 92% 98% 95% 96%    Constitutional: NAD, calm, comfortable Vitals:   12/10/2019 1215 12/12/2019 1230 12/11/2019 1245 12/19/2019 1300  BP: 127/83 (!) 141/102 (!) 168/91 131/85  Pulse: 90 (!) 102 (!) 105 (!) 120  Resp: 17 (!) 24 (!) 22 (!) 21  Temp:      TempSrc:      SpO2: 92% 98% 95% 96%   Eyes: PERRL, lids and conjunctivae normal ENMT: Mucous membranes are dry. Posterior pharynx clear of any exudate or lesions.Normal dentition.  Neck: normal, supple, no masses, no thyromegaly Respiratory: Diminished breathing sound on the right side, scattered wheezing but no crackles.  Increased respiratory effort. No accessory muscle use.  Cardiovascular: Regular rate and rhythm, no murmurs / rubs / gallops. No extremity edema. 2+ pedal pulses. No carotid bruits.  Abdomen: no tenderness, no masses palpated. No hepatosplenomegaly. Bowel sounds positive.  Musculoskeletal: no clubbing / cyanosis. No  joint deformity upper and lower  extremities. Good ROM, no contractures. Normal muscle tone.  Skin: no rashes, lesions, ulcers. No induration Neurologic: No facial droop, moving all limbs, following simple commands Psychiatric: Oriented to person and place confused about time    Labs on Admission: I have personally reviewed following labs and imaging studies  CBC: Recent Labs  Lab 11/22/19 0849 11/28/2019 0954  WBC 9.5 13.7*  NEUTROABS  --  11.6*  HGB 13.4 12.2  HCT 41.2 39.1  MCV 93.2 96.5  PLT 251 573   Basic Metabolic Panel: Recent Labs  Lab 11/22/19 0849 12/14/2019 0954  NA 138 134*  K 3.0* 4.5  CL 96* 98  CO2 31 16*  GLUCOSE 130* 261*  BUN 23 30*  CREATININE 0.94 1.63*  CALCIUM 9.7 8.9   GFR: Estimated Creatinine Clearance: 29.9 mL/min (A) (by C-G formula based on SCr of 1.63 mg/dL (H)). Liver Function Tests: Recent Labs  Lab 12/09/2019 0954  AST 21  ALT 12  ALKPHOS 63  BILITOT 1.6*  PROT 5.9*  ALBUMIN 2.6*   No results for input(s): LIPASE, AMYLASE in the last 168 hours. No results for input(s): AMMONIA in the last 168 hours. Coagulation Profile: Recent Labs  Lab 11/22/19 0849 12/04/2019 0954  INR 1.0 1.3*   Cardiac Enzymes: No results for input(s): CKTOTAL, CKMB, CKMBINDEX, TROPONINI in the last 168 hours. BNP (last 3 results) No results for input(s): PROBNP in the last 8760 hours. HbA1C: No results for input(s): HGBA1C in the last 72 hours. CBG: Recent Labs  Lab 11/22/19 0841 11/22/19 1529  GLUCAP 131* 114*   Lipid Profile: No results for input(s): CHOL, HDL, LDLCALC, TRIG, CHOLHDL, LDLDIRECT in the last 72 hours. Thyroid Function Tests: No results for input(s): TSH, T4TOTAL, FREET4, T3FREE, THYROIDAB in the last 72 hours. Anemia Panel: No results for input(s): VITAMINB12, FOLATE, FERRITIN, TIBC, IRON, RETICCTPCT in the last 72 hours. Urine analysis:    Component Value Date/Time   COLORURINE YELLOW 12/07/2019 1353   APPEARANCEUR CLOUDY  (A) 12/12/2019 1353   LABSPEC 1.025 12/05/2019 1353   PHURINE 5.5 12/22/2019 1353   GLUCOSEU NEGATIVE 12/19/2019 1353   GLUCOSEU NEGATIVE 07/18/2018 1549   HGBUR LARGE (A) 12/06/2019 1353   HGBUR large 12/16/2009 1024   BILIRUBINUR SMALL (A) 12/13/2019 1353   BILIRUBINUR negative 11/15/2017 1041   KETONESUR 40 (A) 12/21/2019 1353   PROTEINUR NEGATIVE 12/17/2019 1353   UROBILINOGEN 0.2 07/18/2018 1549   NITRITE NEGATIVE 12/14/2019 1353   LEUKOCYTESUR MODERATE (A) 12/18/2019 1353    Radiological Exams on Admission: DG Chest Port 1 View  Result Date: 12/11/2019 CLINICAL DATA:  Questionable sepsis - evaluate for abnormality EXAM: PORTABLE CHEST 1 VIEW COMPARISON:  07/29/2018 chest radiograph and prior. 07/18/2019 CT chest. FINDINGS: No pneumothorax, focal consolidation or pleural effusion. Cardiomediastinal silhouette within normal limits. Prominence of the perihilar vessels secondary to hypoinflation. Aortic atherosclerotic calcifications. No acute osseous abnormality. IMPRESSION: No focal airspace disease.  Hypoinflated lungs. Electronically Signed   By: Primitivo Gauze M.D.   On: 11/26/2019 10:26   VAS [place] LOWER EXTREMITY VENOUS (DVT) (ONLY [facility] & [facility])  Result Date: 12/10/2019  Lower Venous DVT Study Indications: Pain, Swelling, and Edema.  Risk Factors: Surgery Lumbar Spinal Fusion 11-22-2019. Performing Technologist: Griffin Basil RCT RDMS  Examination Guidelines: A complete evaluation includes B-mode imaging, spectral Doppler, color Doppler, and power Doppler as needed of all accessible portions of each vessel. Bilateral testing is considered an integral part of a complete examination. Limited examinations for reoccurring indications may be performed  as noted. The reflux portion of the exam is performed with the patient in reverse Trendelenburg.  +---------+---------------+---------+-----------+----------+--------------+ RIGHT    CompressibilityPhasicitySpontaneityPropertiesThrombus  Aging +---------+---------------+---------+-----------+----------+--------------+ CFV      Full           Yes      Yes                                 +---------+---------------+---------+-----------+----------+--------------+ SFJ      Full                                                        +---------+---------------+---------+-----------+----------+--------------+ FV Prox  Full                                                        +---------+---------------+---------+-----------+----------+--------------+ FV Mid   Full                                                        +---------+---------------+---------+-----------+----------+--------------+ FV DistalFull                                                        +---------+---------------+---------+-----------+----------+--------------+ PFV      Full                                                        +---------+---------------+---------+-----------+----------+--------------+ POP      Full           Yes      Yes                                 +---------+---------------+---------+-----------+----------+--------------+ PTV      Full                                                        +---------+---------------+---------+-----------+----------+--------------+ PERO     Full                                                        +---------+---------------+---------+-----------+----------+--------------+   +---------+---------------+---------+-----------+---------------+--------------+ LEFT     CompressibilityPhasicitySpontaneityProperties     Thrombus Aging +---------+---------------+---------+-----------+---------------+--------------+ CFV      Partial  Yes      Yes        softly         Acute                                                      echogenic                     +---------+---------------+---------+-----------+---------------+--------------+ SFJ       None                               softly         Acute                                                      echogenic                     +---------+---------------+---------+-----------+---------------+--------------+ FV Prox  Partial        Yes      Yes        softly         Acute                                                      echogenic                     +---------+---------------+---------+-----------+---------------+--------------+ FV Mid   None                               softly         Acute                                                      echogenic                     +---------+---------------+---------+-----------+---------------+--------------+ FV DistalNone                               softly         Acute                                                      echogenic                     +---------+---------------+---------+-----------+---------------+--------------+ PFV      None  softly         Acute                                                      echogenic                     +---------+---------------+---------+-----------+---------------+--------------+ POP      None                               softly         Acute                                                      echogenic                     +---------+---------------+---------+-----------+---------------+--------------+ PTV      None                               softly         Acute                                                      echogenic                     +---------+---------------+---------+-----------+---------------+--------------+ PERO     None                               softly         Acute                                                      echogenic                     +---------+---------------+---------+-----------+---------------+--------------+    Summary:  RIGHT: - There is no evidence of deep vein thrombosis in the lower extremity.  - No cystic structure found in the popliteal fossa.  LEFT: - Findings consistent with acute deep vein thrombosis involving the left common femoral vein, SF junction, left femoral vein, left proximal profunda vein, left popliteal vein, left posterior tibial veins, and left peroneal veins. - No cystic structure found in the popliteal fossa.  *See table(s) above for measurements and observations. Electronically signed by Curt Jews MD on 12/06/2019 at 3:07:17 PM.    Final     EKG: Independently reviewed.  Rapid A. fib  Assessment/Plan Active Problems:   Sepsis ([place])  (please populate well all problems here in Problem List. (For example, if patient is on BP meds at home and you resume or decide to hold them, it is a problem that needs  to be her. Same for CAD, COPD, HLD and so on)  Sepsis -Evidenced by fever, leukocytosis, tachycardia and tachypnea, source considered to be pneumonia versus UTI.  Evidence for pneumonia currently involving tachypnea, hypoxia and nursing home, and frequent nauseous vomiting 2 days ago. -Start cefepime in the ED, will check CT chest -Clinically patient still looks dry, will continue LR at 125, hold diuresis  Acute metabolic encephalopathy -Secondary sepsis, treat sepsis as above.  Rapid A. Fib -On Cardizem drip, change metoprolol from as needed to standing dose and increase to 50 mg twice daily. -We will consider digoxin loading if heart rate not controlled in the next few hours. -Continue Eliquis  Acute DVT -Most likely the same DVT diagnosed 2 weeks ago, will stop heparin drip which was started in the ED, switch back to Eliquis.  AKI with non-anion gap metabolic acidosis -Clinically patient has hypovolemia, received IV boluses of 500 ml by EMS, continue LR -Consider imaging study if not improving tomorrow  IDDM with hyperglycemia -Hold Metformin for AKI and lactic acidosis. -Change  standing dose of Lantus to sliding scale for better  HTN -Hold diuresis medication now, most recent LVEF 65% 2 years ago. -Consider restart diuresis once sepsis resolved.  DVT prophylaxis: Eliquis  code Status: DNR Family Communication: Son at bedside Disposition Plan: Patient sick with sepsis and source to be determined, and rapid A. fib, likely will need more than 2 midnight hospital stay for IV antibiotics and IV rate control medications Consults called: None Admission status: PCU   Lequita Halt MD [facility] Pager [phone]  12/07/2019, 3:47 PM

## 2019-11-27 NOTE — Progress Notes (Signed)
Bilateral Lower Ext. study completed.   See CVProc for preliminary results.   Shambhavi Salley, RDMS, RVT 

## 2019-11-27 NOTE — ED Triage Notes (Signed)
Pt BIB [facility] from [facility] for elevated WBC, temp 102, UTI. Pt recently had a lumbar spinal fusion Friday. Pt alert, not oriented per facility this is not her norm. Normally a/o x4 Pt placed on 4L [place] does not use O2 at home.   20g LAC received 500cc NS  10 mg Cardizem IVP  HR 176 on arrival HX A-fib   Pt seems to recognize her daughter on arrival but nonverbal

## 2019-11-27 NOTE — Sepsis Progress Note (Signed)
Tracking Code Sepsis as per order for Code sepsis.

## 2019-11-27 NOTE — ED Provider Notes (Signed)
  Face-to-face evaluation   History: Patient presents for evaluation of confusion which began yesterday, while she was at rehab.  She is recovering from kyphoplasty, done several days ago.  Evaluation was done at the facility yesterday, results not known.  Patient with daughter, giving history.  Physical exam: Patient unable to give history.  She is somewhat agitated, and tremulous.  No respiratory distress.  Medical screening examination/treatment/procedure(s) were conducted as a shared visit with non-physician practitioner(s) and myself.  I personally evaluated the patient during the encounter    Daleen Bo, MD 12/14/2019 [phone]

## 2019-11-27 NOTE — Progress Notes (Signed)
ANTICOAGULATION CONSULT NOTE - Initial Consult  Pharmacy Consult for heparin Indication: DVT / Afib   No Known Allergies  Patient Measurements:   Heparin Dosing Weight: 74.8 kg   Vital Signs: Temp: 100.2 F (37.9 C) (11/03 0948) Temp Source: Rectal (11/03 0948) BP: 150/105 (11/03 0948) Pulse Rate: 118 (11/03 0948)  Labs: Recent Labs    12/15/2019 0954  HGB 12.2  HCT 39.1  PLT 300  APTT 32  LABPROT 15.9*  INR 1.3*    Estimated Creatinine Clearance: 51.8 mL/min (by C-G formula based on SCr of 0.94 mg/dL).   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 Prox Cx lesion is 30% stenosed. Mid Cx lesion is 25% stenosed. Ost 3rd Mrg lesion is 30% stenosed. Prox LAD lesion is 25% stenosed. Mid LAD lesion is 25% stenosed. The left ventricular systolic function is normal. LV end diastolic pressure is normal. The left ventricular ejection fraction is 55-65% by visual estimate. There is no aortic valve stenosis.   N   Diabetes mellitus    type 2   Edema 07/12/2017   Essential hypertension 01/20/2009   Gallstones    Headache    History of radiation therapy 08/09/2016 to 09/19/2016   Right lung was treated to 60 Gy in 30 fractions at 2 Gy per fraction   Hypertension    Hypertriglyceridemia 08/22/2012   Low back pain    Lung mass    with lymphadenopathy   Osteoarthritis    Type II diabetes mellitus with manifestations ([place]) 01/20/2009   Estimated Creatinine Clearance: 49.8 mL/min (by C-G formula based on SCr of 1 mg/dL).    Medications:  (Not in a hospital admission)   Assessment: 57 YOF who presents with confusion and is found to have an acute LLE DVT in addition to Afib  with RVR. Pharmacy consulted to start IV heparin. Of note, patient was on apixaban at home but last dose was on 11/1.    H/H and Plt wnl. SCr elevated at 1.63 (BL ~ 0.9)   Goal of Therapy:  Heparin level 0.3-0.7 units/ml aPTT 66-102 seconds Monitor platelets by anticoagulation protocol: Yes   Plan:  -Heparin 2000 units IV bolus followed by IV heparin at 1200 units/hr  -F/u 8 hr HL and aPTT  -Monitor daily HL, aPTT, CBC and s/s of bleeding   Albertina Parr, PharmD., BCPS, BCCCP Clinical Pharmacist Please refer to [facility] for unit-specific pharmacist

## 2019-11-27 NOTE — Sepsis Progress Note (Signed)
Notified provider of need to order fluid bolus and repeat Lactic Acid.

## 2019-11-27 NOTE — ED Provider Notes (Signed)
[facility] EMERGENCY DEPARTMENT Provider Note   CSN: [id number] Arrival date & time: 12/02/2019  1314     History Chief Complaint  Patient presents with  . Code Sepsis  . Atrial Fibrillation    Nicole [name] is a 79 y.o. female.  HPI   Patient with significant medical history of A. fib, on Eliquis, adenoma carcinoma right lung in remission, type 2 diabetes, recent lumbar kyphoplasty performed on Friday presents to the emergency department with altered mental status and tachycardia.  Patient is coming from [facility] via EMS she was found to be in AFIb tachycardic with rates in the 180s to 200s, oral temp of 102, not oriented to person place or time, EMS provided her with 500 cc of normal saline, 45m of Cardizem, placed on 4 L nasal cannula.  Daughters at bedside explains that patient recently had a [facility]  She did well over the weekend but yesterday she started becoming lethargic and delirious.  Daughter informed me that patient her history was concerning for UTI and obtained UA and chest x-ray but not received results yet.  Daughter states patient is altered from baseline as she is generally walking and talking without any difficulty.  Patient also denies recent falls, head traumas, currently on Eliquis for her A. fib and has been taking all of her medications as prescribed, she is Covid vaccinated denies recent sick contacts or travels.  Past Medical History:  Diagnosis Date  . A-fib ([place]   . Adenocarcinoma of right lung, stage 3 ([place] 07/28/2016  . Atrial flutter ([place] 10/05/2016  . Bronchitis    hx of  . Cancer ([facility] 2004   uterine/cervical  . Cancer-related pain 10/06/2016  . Coronary artery disease 02/21/2017   JAN 2019 Prox RCA lesion is 25% stenosed. Prox Cx lesion is 30% stenosed. Mid Cx lesion is 25% stenosed. Ost 3rd Mrg lesion is 30% stenosed. Prox LAD lesion is 25% stenosed. Mid LAD lesion is 25%  stenosed. The left ventricular systolic function is normal. LV end diastolic pressure is normal. The left ventricular ejection fraction is 55-65% by visual estimate. There is no aortic valve stenosis.   N  . Diabetes mellitus    type 2  . Edema 07/12/2017  . Essential hypertension 01/20/2009  . Gallstones   . Headache   . History of radiation therapy 08/09/2016 to 09/19/2016   Right lung was treated to 60 Gy in 30 fractions at 2 Gy per fraction  . Hypertension   . Hypertriglyceridemia 08/22/2012  . Low back pain   . Lung mass    with lymphadenopathy  . Osteoarthritis   . Type II diabetes mellitus with manifestations ([place] 01/20/2009   Estimated Creatinine Clearance: 49.8 mL/min (by C-G formula based on SCr of 1 mg/dL).    Patient Active Problem List   Diagnosis Date Noted  . Persistent atrial fibrillation ([place] 08/21/2019  . Senile purpura ([place] 08/03/2019  . Decubitus ulcer of sacral region, stage 1 05/08/2019  . Diastolic dysfunction with chronic heart failure ([facility] 04/30/2019  . Paroxysmal atrial fibrillation ([facility] 02/20/2019  . Secondary hypercoagulable state ([place] 02/20/2019  . External hemorrhoids 08/16/2018  . Hyperlipidemia with target LDL less than 100 07/18/2018  . Atherosclerosis of aorta ([facility] 07/18/2018  . Spinal stenosis at L4-L5 level 01/25/2018  . Arthritis of sacroiliac joint 01/11/2018  . Coronary artery disease 02/21/2017  . Pneumonitis, radiation ([facility] 01/26/2017  . Encounter for antineoplastic  immunotherapy 10/12/2016  . Cancer-related pain 10/06/2016  . Atrial flutter ([place]) 10/05/2016  . Adenocarcinoma of right lung, stage 3 ([place]) 07/28/2016  . Encounter for antineoplastic chemotherapy 07/28/2016  . Pancreatic cyst 11/09/2015  . Routine general medical examination at a health care facility 06/30/2014  . Gout 01/18/2013  . Hypertriglyceridemia 08/22/2012  . Osteopenia 10/31/2011  . Type II diabetes mellitus with manifestations ([place]) 01/20/2009  .  Essential hypertension 01/20/2009  . Osteoarthritis 01/20/2009    Past Surgical History:  Procedure Laterality Date  . ABDOMINAL HYSTERECTOMY  2004  . APPENDECTOMY  when 79 years old  . COLONOSCOPY  08-05-09   [name]  . I & D EXTREMITY Right 04/04/2019   Procedure: IRRIGATION AND DEBRIDEMENT RIGHT HAND;  Surgeon: Verner Mould, MD;  Location: [facility]S;  Service: Orthopedics;  Laterality: Right;  . INCONTINENCE SURGERY    . IR KYPHO EA ADDL LEVEL THORACIC OR LUMBAR  11/22/2019  . IR KYPHO LUMBAR INC FX REDUCE BONE BX UNI/BIL CANNULATION INC/IMAGING  11/22/2019  . IR RADIOLOGIST EVAL & MGMT  11/13/2019  . KNEE ARTHROSCOPY Left   . LEFT HEART CATH AND CORONARY ANGIOGRAPHY N/A 02/24/2017   Procedure: LEFT HEART CATH AND CORONARY ANGIOGRAPHY;  Surgeon: Jettie Booze, MD;  Location: [facility];  Service: Cardiovascular;  Laterality: N/A;  . LEFT HEART CATHETERIZATION WITH CORONARY ANGIOGRAM N/A 07/19/2013   Procedure: LEFT HEART CATHETERIZATION WITH CORONARY ANGIOGRAM;  Surgeon: Sinclair Grooms, MD;  Location[facility] C[facility];  Service: Cardiovascular;  Laterality: N/A;  . POLYPECTOMY  08-05-09   2 polyps  . TOTAL KNEE ARTHROPLASTY Left 03/28/2012   Procedure: LEFT TOTAL KNEE ARTHROPLASTY;  Surgeon: Tobi Bastos, MD;  Location: [facility];  Service: Orthopedics;  Laterality: Left;  . TUBAL LIGATION    . VIDEO BRONCHOSCOPY WITH ENDOBRONCHIAL ULTRASOUND N/A 07/11/2016   Procedure: VIDEO BRONCHOSCOPY WITH ENDOBRONCHIAL ULTRASOUND;  Surgeon: Marshell Garfinkel, MD;  Location: [facility];  Service: Pulmonary;  Laterality: N/A;     OB History   No obstetric history on file.     Family History  Problem Relation Age of Onset  . Heart attack Father   . Heart disease Father   . Arthritis Other   . Cancer Other        colon, lst degree relative  . Diabetes Other        st degree relative  . Hyperlipidemia Other   . Hypertension Other   . Colon cancer Neg Hx     Social History    Tobacco Use  . Smoking status: Never Smoker  . Smokeless tobacco: Never Used  Vaping Use  . Vaping Use: Never used  Substance Use Topics  . Alcohol use: No    Alcohol/week: 0.0 standard drinks  . Drug use: No    Home Medications Prior to Admission medications   Medication Sig Start Date End Date Taking? Authorizing Provider  acetaminophen (TYLENOL) 325 MG tablet Take 650 mg by mouth every 6 (six) hours as needed for headache.   Yes [provider]  b complex vitamins tablet Take 1 tablet by mouth daily.   Yes [provider]  CALCIUM-VITAMIN D-VITAMIN K PO Take 2 tablets by mouth daily with breakfast. Calcium 500 mg vitamin d 200 unit vitamin k 40 mg    Yes [provider]  Collagenase POWD Apply 1 application topically daily as needed (skin care).    Yes [provider]  diltiazem (CARDIZEM CD) 360  MG 24 hr capsule TAKE 1 CAPSULE(360 MG) BY MOUTH DAILY Patient taking differently: Take 360 mg by mouth daily.  07/09/19  Yes Jettie Booze, MD  docusate sodium (COLACE) 100 MG capsule Take 100 mg by mouth daily.   Yes [provider]  ELIQUIS 5 MG TABS tablet Take 5 mg by mouth 2 (two) times daily.  09/25/19  Yes [provider]  insulin glargine, 2 Unit Dial, (TOUJEO MAX SOLOSTAR) 300 UNIT/ML Solostar Pen Inject 30 Units into the skin daily. Patient taking differently: Inject 20 Units into the skin daily.  10/14/19  Yes Burns, Claudina Lick, MD  LACTOBACILLUS PO Take 1 capsule by mouth daily. 3 billion cell   Yes [provider]  Lidocaine 4 % PTCH Apply 1 patch topically daily. Apply 1 patch in the morning, remove at night.   Yes [provider]  Lidocaine-Menthol 4-1 % PTCH Apply 1 patch topically daily. Apply to lower back   Yes [provider]  metFORMIN (GLUCOPHAGE) 500 MG tablet Take 1,000 mg by mouth daily with breakfast.   Yes [provider]  methocarbamol (ROBAXIN) 500 MG tablet Take 500 mg  by mouth daily.   Yes [provider]  metoprolol tartrate (LOPRESSOR) 25 MG tablet Take 1 tablet every 8 hours AS NEEDED for rapid heart rate Patient taking differently: Take 25 mg by mouth every 8 (eight) hours as needed (hypertention).  08/01/18  Yes Fenton, Clint R, PA  Multiple Vitamins-Minerals (WOMENS MULTIVITAMIN PO) Take 1 tablet by mouth daily.   Yes [provider]  Omega-3 Fatty Acids (OMEGA III EPA+DHA PO) Take 1 capsule by mouth daily. Fish oil 1200 mg, dha 144 mg, epa 216 mg    Yes [provider]  rosuvastatin (CRESTOR) 5 MG tablet TAKE 1 TABLET(5 MG) BY MOUTH DAILY Patient taking differently: TAKE 1 TABLET(5 MG) BY MOUTH DAILY 10/07/19  Yes [name], MD  torsemide (DEMADEX) 20 MG tablet Take 1 tablet (20 mg total) by mouth every other day. Patient taking differently: Take 20 mg by mouth daily.  08/28/19  Yes Fenton, Clint R, PA  traMADol (ULTRAM) 50 MG tablet Take 25 mg by mouth 2 (two) times daily as needed for moderate pain.   Yes [provider]  witch hazel-glycerin (TUCKS) pad Apply 1 application topically 3 (three) times daily.   Yes [provider]  CONTOUR NEXT TEST test strip USE 1 STRIP TO CHECK GLUCOSE TWICE DAILY 12/25/18   [name], MD  glucose blood test strip Contour Next Test Strips  USE 1 STRIP TO CHECK GLUCOSE TWICE DAILY    [provider]  Insulin Pen Needle (NOVOFINE) 32G X 6 MM MISC 1 Act by Does not apply route daily. 06/17/19   [name], MD  Lancets (ACCU-CHEK SOFT [facility]) lancets Use to check blood sugars daily Dx E11.9 06/18/19   [name], MD  irbesartan (AVAPRO) 300 MG tablet Take 1 tablet (300 mg total) by mouth daily. 12/12/18   [name], MD    Allergies    Patient has no known allergies.  Review of Systems   Review of Systems  Unable to perform ROS: Mental status change    Physical Exam Updated Vital Signs BP 131/85   Pulse (!) 120   Temp 100.2 F (37.9 C)  (Rectal)   Resp (!) 21   SpO2 96%   Physical Exam Vitals and nursing note reviewed. Exam conducted with a chaperone present.  Constitutional:      General: She is in acute distress.     Appearance: She is ill-appearing and toxic-appearing.     Comments: Patient is altered, to person place time and events.  She is in A. fib RVR rate 180s to 200s with blood pressures 150/100.  She is tachypneic but does not appear to be hypoxic O2 sats satting at 98 room air.  HENT:     Head: Normocephalic and atraumatic.     Nose: No congestion.     Mouth/Throat:     Mouth: Mucous membranes are moist.     Pharynx: Oropharynx is clear.  Eyes:     General: No scleral icterus. Cardiovascular:     Rate and Rhythm: Tachycardia present. Rhythm irregular.     Pulses: Normal pulses.     Heart sounds: No murmur heard.  No friction rub. No gallop.   Pulmonary:     Effort: No respiratory distress.     Breath sounds: No wheezing, rhonchi or rales.  Abdominal:     General: There is no distension.     Palpations: Abdomen is soft.     Tenderness: There is no abdominal tenderness. There is no right CVA tenderness, left CVA tenderness or guarding.  Genitourinary:    Comments: With chaperone present rectum was visualized she has external hemorrhoids, they were not erythematous, no bleeding or discharge noted.  Rectal temperature was obtained 100.2. Musculoskeletal:        General: Signs of injury present. No swelling.     Right lower leg: No edema.     Left lower leg: Edema present.     Comments: Patient's back was visualized she had 2 small surgical incisions on her lumbar spine.  They measured 1 cm in length no surrounding erythema, no drainage or discharge present.  Area was tender to palpation no fluctuance or indurations felt.  Patient had similar left leg swelling, 2+ edema up to patient's shin, no ulcers, ecchymosis, other gross abnormalities noted.  Neurovascular fully intact.  Skin:    General: Skin is  warm and dry.     Capillary Refill: Capillary refill takes less than 2 seconds.     Findings: No rash.  Neurological:     Mental Status: She is disoriented.  Psychiatric:        Mood and Affect: Mood normal.     ED Results / Procedures / Treatments   Labs (all labs ordered are listed, but only abnormal results are displayed) Labs Reviewed  LACTIC ACID, PLASMA - Abnormal; Notable for the following components:      Result Value   Lactic Acid, Venous 2.7 (*)    All other components within normal limits  LACTIC ACID, PLASMA - Abnormal; Notable for the following components:   Lactic Acid, Venous 2.2 (*)    All other components within normal limits  COMPREHENSIVE METABOLIC PANEL - Abnormal; Notable for the following components:   Sodium 134 (*)    CO2 16 (*)    Glucose, Bld 261 (*)    BUN 30 (*)    Creatinine, Ser 1.63 (*)    Total Protein 5.9 (*)    Albumin 2.6 (*)    Total Bilirubin 1.6 (*)    GFR, Estimated 32 (*)    Anion gap 20 (*)    All other components within normal limits  CBC WITH DIFFERENTIAL/PLATELET - Abnormal; Notable for the following components:   WBC 13.7 (*)    Neutro Abs 11.6 (*)  Lymphs Abs 0.6 (*)    Monocytes Absolute 1.4 (*)    Abs Immature Granulocytes 0.14 (*)    All other components within normal limits  PROTIME-INR - Abnormal; Notable for the following components:   Prothrombin Time 15.9 (*)    INR 1.3 (*)    All other components within normal limits  URINALYSIS, ROUTINE W REFLEX MICROSCOPIC - Abnormal; Notable for the following components:   APPearance CLOUDY (*)    Hgb urine dipstick LARGE (*)    Bilirubin Urine SMALL (*)    Ketones, ur 40 (*)    Leukocytes,Ua MODERATE (*)    All other components within normal limits  URINALYSIS, MICROSCOPIC (REFLEX) - Abnormal; Notable for the following components:   Bacteria, UA RARE (*)    All other components within normal limits  CULTURE, BLOOD (ROUTINE X 2)  CULTURE, BLOOD (ROUTINE X 2)  URINE  CULTURE  RESPIRATORY PANEL BY RT PCR (FLU A&B, COVID)  APTT  HEPARIN LEVEL (UNFRACTIONATED)  APTT  CK    EKG EKG Interpretation  Date/Time:  Wednesday November 27 2019 09:31:05 EDT Ventricular Rate:  186 PR Interval:    QRS Duration: 79 QT Interval:  263 QTC Calculation: 463 R Axis:   81 Text Interpretation: Atrial fibrillation with rapid V-rate Borderline right axis deviation Repolarization abnormality, prob rate related SINCE LAST TRACING HEART RATE HAS INCREASED Otherwise no significant change Confirmed by Daleen Bo [phone]) on 11/30/2019 10:19:00 AM Also confirmed by Daleen Bo [phone]), editor Hattie Perch [phone])  on 12/23/2019 11:18:38 AM   Radiology DG Chest Port 1 View  Result Date: 12/05/2019 CLINICAL DATA:  Questionable sepsis - evaluate for abnormality EXAM: PORTABLE CHEST 1 VIEW COMPARISON:  07/29/2018 chest radiograph and prior. 07/18/2019 CT chest. FINDINGS: No pneumothorax, focal consolidation or pleural effusion. Cardiomediastinal silhouette within normal limits. Prominence of the perihilar vessels secondary to hypoinflation. Aortic atherosclerotic calcifications. No acute osseous abnormality. IMPRESSION: No focal airspace disease.  Hypoinflated lungs. Electronically Signed   By: Primitivo Gauze M.D.   On: 12/02/2019 10:26   VAS [place] LOWER EXTREMITY VENOUS (DVT) (ONLY [facility] & [facility])  Result Date: 12/20/2019  Lower Venous DVT Study Indications: Pain, Swelling, and Edema.  Risk Factors: Surgery Lumbar Spinal Fusion 11-22-2019. Performing Technologist: Griffin Basil RCT RDMS  Examination Guidelines: A complete evaluation includes B-mode imaging, spectral Doppler, color Doppler, and power Doppler as needed of all accessible portions of each vessel. Bilateral testing is considered an integral part of a complete examination. Limited examinations for reoccurring indications may be performed as noted. The reflux portion of the exam is performed with the patient in  reverse Trendelenburg.  +---------+---------------+---------+-----------+----------+--------------+ RIGHT    CompressibilityPhasicitySpontaneityPropertiesThrombus Aging +---------+---------------+---------+-----------+----------+--------------+ CFV      Full           Yes      Yes                                 +---------+---------------+---------+-----------+----------+--------------+ SFJ      Full                                                        +---------+---------------+---------+-----------+----------+--------------+ FV Prox  Full                                                        +---------+---------------+---------+-----------+----------+--------------+  FV Mid   Full                                                        +---------+---------------+---------+-----------+----------+--------------+ FV DistalFull                                                        +---------+---------------+---------+-----------+----------+--------------+ PFV      Full                                                        +---------+---------------+---------+-----------+----------+--------------+ POP      Full           Yes      Yes                                 +---------+---------------+---------+-----------+----------+--------------+ PTV      Full                                                        +---------+---------------+---------+-----------+----------+--------------+ PERO     Full                                                        +---------+---------------+---------+-----------+----------+--------------+   +---------+---------------+---------+-----------+---------------+--------------+ LEFT     CompressibilityPhasicitySpontaneityProperties     Thrombus Aging +---------+---------------+---------+-----------+---------------+--------------+ CFV      Partial        Yes      Yes        softly         Acute                                                       echogenic                     +---------+---------------+---------+-----------+---------------+--------------+ SFJ      None                               softly         Acute                                                      echogenic                     +---------+---------------+---------+-----------+---------------+--------------+  FV Prox  Partial        Yes      Yes        softly         Acute                                                      echogenic                     +---------+---------------+---------+-----------+---------------+--------------+ FV Mid   None                               softly         Acute                                                      echogenic                     +---------+---------------+---------+-----------+---------------+--------------+ FV DistalNone                               softly         Acute                                                      echogenic                     +---------+---------------+---------+-----------+---------------+--------------+ PFV      None                               softly         Acute                                                      echogenic                     +---------+---------------+---------+-----------+---------------+--------------+ POP      None                               softly         Acute                                                      echogenic                     +---------+---------------+---------+-----------+---------------+--------------+ PTV      None  softly         Acute                                                      echogenic                     +---------+---------------+---------+-----------+---------------+--------------+ PERO     None                               softly         Acute                                                       echogenic                     +---------+---------------+---------+-----------+---------------+--------------+    Summary: RIGHT: - There is no evidence of deep vein thrombosis in the lower extremity.  - No cystic structure found in the popliteal fossa.  LEFT: - Findings consistent with acute deep vein thrombosis involving the left common femoral vein, SF junction, left femoral vein, left proximal profunda vein, left popliteal vein, left posterior tibial veins, and left peroneal veins. - No cystic structure found in the popliteal fossa.  *See table(s) above for measurements and observations. Electronically signed by Curt Jews MD on 12/12/2019 at 3:07:17 PM.    Final     Procedures .Critical Care Performed by: Marcello Fennel, PA-C Authorized by: Marcello Fennel, PA-C   Critical care provider statement:    Critical care time (minutes):  45   Critical care time was exclusive of:  Separately billable procedures and treating other patients   Critical care was necessary to treat or prevent imminent or life-threatening deterioration of the following conditions:  Sepsis   Critical care was time spent personally by me on the following activities:  Discussions with consultants, evaluation of patient's response to treatment, examination of patient, ordering and performing treatments and interventions, ordering and review of laboratory studies, ordering and review of radiographic studies, pulse oximetry, re-evaluation of patient's condition, obtaining history from patient or surrogate and review of old charts   I assumed direction of critical care for this patient from another provider in my specialty: no     (including critical care time)  Medications Ordered in ED Medications  lactated ringers infusion ( Intravenous New Bag/Given 12/19/2019 1108)  ceFEPIme (MAXIPIME) 2 g in sodium chloride 0.9 % 100 mL IVPB (has no administration in time range)  diltiazem  (CARDIZEM) 125 mg in dextrose 5% 125 mL (1 mg/mL) infusion (15 mg/hr Intravenous Rate/Dose Change 12/12/2019 1124)  heparin ADULT infusion 100 units/mL (25000 units/212m sodium chloride 0.45%) (1,200 Units/hr Intravenous New Bag/Given 12/07/2019 1415)  metoprolol tartrate (LOPRESSOR) injection 5 mg (5 mg Intravenous Given 11/30/2019 0958)  ceFEPIme (MAXIPIME) 2 g in sodium chloride 0.9 % 100 mL IVPB (0 g Intravenous Stopped 12/21/2019 1057)  lactated ringers bolus 1,000 mL (0 mLs Intravenous Stopped 12/15/2019 1108)  diltiazem (CARDIZEM) 1 mg/mL load via infusion 15 mg (15 mg Intravenous Bolus from Bag 12/01/2019 1107)  ondansetron (ZOFRAN)  injection 4 mg (4 mg Intravenous Given 12/19/2019 1100)  morphine 2 MG/ML injection 1 mg (1 mg Intravenous Given 12/23/2019 1102)  heparin bolus via infusion 2,000 Units (2,000 Units Intravenous Bolus from Bag 12/19/2019 1411)    ED Course  I have reviewed the triage vital signs and the nursing notes.  Pertinent labs & imaging results that were available during my care of the patient were reviewed by me and considered in my medical decision making (see chart for details).    MDM Rules/Calculators/A&P                          Patient presents with atrial and altered mental status.  She appeared to be in acute distress, vital signs sent for tachycardia and tachypnea.  Will start patient on LR fluids, provide 5 mg of metoprolol and reevaluate.  Patient was reevaluated after providing metoprolol, patient has noted atrial fibrillation, started on diltiazem drip and continue to monitor.  Will order code sepsis as patient is tachycardic, febrile, possible source infection.  will start her on antibiotics and obtain appropriate lab work.  CBC shows leukocytosis of 13.7, no anemia noted, CMP shows hyponatremia of 188, metabolic acidosis of with a bicarb of 16, hyperglycemia of 261, elevated bun 31, AKI 6.3 from baseline 1.7, hypoalbuminemia 2.6, increasing T bili and an anion gap of 20.   Lactic is 2.7, prothrombin time 15.9.  UA shows large hemoglobin,  moderate leukocytes, bacteria.  INR 1.3 venous ultrasound of lower extremity shows DVT patient's left leg.  Chest x-ray does not reveal any acute airspace disease shows hypoinflated lungs.  EKG shows A. fib with rapid ventricular rate no signs of ST elevation depression noted.    Due to positive DVT study will start patient on heparin and continue to monitor.  Patient was reassessed patient appears to be more alert she is now oriented to person place and events.  She denies any pain at this time lung sounds were clear bilaterally heart rate is in the 416S with systolic in the 063K.  We will continue with current treatment.  Will consult hospitalist for further recommendation evaluation form urosepsis.  Spoke with Dr. Roosevelt Locks hospitalist team he switch him medication will come down and assess the patient.  I have low suspicion for ACS as history is atypical patient denies chest pain,  EKG atrial fibrillation without signs of ischemia  Low suspicion for PE as patient denies pleuritic chest pain or  shortness of breath she is also on anticoagulants. I suspect tachycardia and tachypnea are secondary to systemic infection as she is febrile with a elevated lactate. patient does have noted left DVT which is most likely secondary to her previous kyphoplasty procedure performed on Friday where they withheld her anticoagulants.  She has been started on heparin.  I suspect patient's suffering from urosepsis, anticipate hospital admission and further IV antibiotics.  Patient is stable at this time will transfer patient care to hospitalist team for further management.  Final Clinical Impression(s) / ED Diagnoses Final diagnoses:  Sepsis with acute organ dysfunction without septic shock, due to unspecified organism, unspecified type [facility])    Rx / DC Orders ED Discharge Orders    None       Marcello Fennel, PA-C 12/06/2019 1519    Daleen Bo, MD 12/14/2019 1658

## 2019-11-28 ENCOUNTER — Inpatient Hospital Stay (HOSPITAL_COMMUNITY): Payer: Medicare Other

## 2019-11-28 ENCOUNTER — Other Ambulatory Visit (HOSPITAL_COMMUNITY): Payer: Medicare Other

## 2019-11-28 DIAGNOSIS — I609 Nontraumatic subarachnoid hemorrhage, unspecified: Principal | ICD-10-CM

## 2019-11-28 DIAGNOSIS — A419 Sepsis, unspecified organism: Secondary | ICD-10-CM | POA: Diagnosis not present

## 2019-11-28 DIAGNOSIS — I4891 Unspecified atrial fibrillation: Secondary | ICD-10-CM

## 2019-11-28 DIAGNOSIS — I340 Nonrheumatic mitral (valve) insufficiency: Secondary | ICD-10-CM | POA: Diagnosis not present

## 2019-11-28 DIAGNOSIS — I351 Nonrheumatic aortic (valve) insufficiency: Secondary | ICD-10-CM

## 2019-11-28 DIAGNOSIS — I361 Nonrheumatic tricuspid (valve) insufficiency: Secondary | ICD-10-CM | POA: Diagnosis not present

## 2019-11-28 DIAGNOSIS — L8990 Pressure ulcer of unspecified site, unspecified stage: Secondary | ICD-10-CM | POA: Insufficient documentation

## 2019-11-28 DIAGNOSIS — I824Z2 Acute embolism and thrombosis of unspecified deep veins of left distal lower extremity: Secondary | ICD-10-CM

## 2019-11-28 LAB — BASIC METABOLIC PANEL
Anion gap: 11 (ref 5–15)
BUN: 27 mg/dL — ABNORMAL HIGH (ref 8–23)
CO2: 24 mmol/L (ref 22–32)
Calcium: 8.7 mg/dL — ABNORMAL LOW (ref 8.9–10.3)
Chloride: 100 mmol/L (ref 98–111)
Creatinine, Ser: 0.93 mg/dL (ref 0.44–1.00)
GFR, Estimated: >60 mL/min (ref >60)
Glucose, Bld: 205 mg/dL — ABNORMAL HIGH (ref 70–99)
Potassium: 3.3 mmol/L — ABNORMAL LOW (ref 3.5–5.1)
Sodium: 135 mmol/L (ref 135–145)

## 2019-11-28 LAB — CBC
HCT: 36.6 % (ref 36.0–46.0)
HCT: 34.8 % — ABNORMAL LOW (ref 36.0–46.0)
Hemoglobin: 12.1 g/dL (ref 12.0–15.0)
Hemoglobin: 11.4 g/dL — ABNORMAL LOW (ref 12.0–15.0)
MCH: 30.9 pg (ref 26.0–34.0)
MCH: 30.3 pg (ref 26.0–34.0)
MCHC: 33.1 g/dL (ref 30.0–36.0)
MCHC: 32.8 g/dL (ref 30.0–36.0)
MCV: 93.4 fL (ref 80.0–100.0)
MCV: 92.6 fL (ref 80.0–100.0)
Platelets: 293 10*3/uL (ref 150–400)
Platelets: 271 10*3/uL (ref 150–400)
RBC: 3.92 MIL/uL (ref 3.87–5.11)
RBC: 3.76 MIL/uL — ABNORMAL LOW (ref 3.87–5.11)
RDW: 14.9 % (ref 11.5–15.5)
RDW: 14.8 % (ref 11.5–15.5)
WBC: 12.6 10*3/uL — ABNORMAL HIGH (ref 4.0–10.5)
WBC: 12.9 10*3/uL — ABNORMAL HIGH (ref 4.0–10.5)
nRBC: 0 % (ref 0.0–0.2)
nRBC: 0 % (ref 0.0–0.2)

## 2019-11-28 LAB — COMPREHENSIVE METABOLIC PANEL
ALT: 13 U/L (ref 0–44)
AST: 18 U/L (ref 15–41)
Albumin: 2.7 g/dL — ABNORMAL LOW (ref 3.5–5.0)
Alkaline Phosphatase: 65 U/L (ref 38–126)
Anion gap: 11 (ref 5–15)
BUN: 31 mg/dL — ABNORMAL HIGH (ref 8–23)
CO2: 24 mmol/L (ref 22–32)
Calcium: 9.3 mg/dL (ref 8.9–10.3)
Chloride: 99 mmol/L (ref 98–111)
Creatinine, Ser: 1.09 mg/dL — ABNORMAL HIGH (ref 0.44–1.00)
GFR, Estimated: 52 mL/min — ABNORMAL LOW (ref >60)
Glucose, Bld: 207 mg/dL — ABNORMAL HIGH (ref 70–99)
Potassium: 4.1 mmol/L (ref 3.5–5.1)
Sodium: 134 mmol/L — ABNORMAL LOW (ref 135–145)
Total Bilirubin: 0.7 mg/dL (ref 0.3–1.2)
Total Protein: 6.2 g/dL — ABNORMAL LOW (ref 6.5–8.1)

## 2019-11-28 LAB — TYPE AND SCREEN
ABO/RH(D): O POS
Antibody Screen: NEGATIVE

## 2019-11-28 LAB — GLUCOSE, CAPILLARY
Glucose-Capillary: 164 mg/dL — ABNORMAL HIGH (ref 70–99)
Glucose-Capillary: 178 mg/dL — ABNORMAL HIGH (ref 70–99)
Glucose-Capillary: 186 mg/dL — ABNORMAL HIGH (ref 70–99)
Glucose-Capillary: 198 mg/dL — ABNORMAL HIGH (ref 70–99)
Glucose-Capillary: 208 mg/dL — ABNORMAL HIGH (ref 70–99)
Glucose-Capillary: 216 mg/dL — ABNORMAL HIGH (ref 70–99)

## 2019-11-28 LAB — ECHOCARDIOGRAM COMPLETE
Area-P 1/2: 3.68 cm2
S' Lateral: 2.07 cm
Weight: 2684.32 oz

## 2019-11-28 LAB — PROTIME-INR
INR: 1.7 — ABNORMAL HIGH (ref 0.8–1.2)
Prothrombin Time: 19 seconds — ABNORMAL HIGH (ref 11.4–15.2)

## 2019-11-28 LAB — TSH: TSH: 0.293 u[IU]/mL — ABNORMAL LOW (ref 0.350–4.500)

## 2019-11-28 LAB — BRAIN NATRIURETIC PEPTIDE: B Natriuretic Peptide: 412.3 pg/mL — ABNORMAL HIGH (ref 0.0–100.0)

## 2019-11-28 LAB — VITAMIN B12: Vitamin B-12: 5165 pg/mL — ABNORMAL HIGH (ref 180–914)

## 2019-11-28 LAB — APTT: aPTT: 34 seconds (ref 24–36)

## 2019-11-28 MED ORDER — NIMODIPINE 30 MG PO CAPS: 60.0000 mg | ORAL_CAPSULE | ORAL | Status: DC

## 2019-11-28 MED ORDER — HYDROMORPHONE HCL 1 MG/ML IJ SOLN
0.5000 mg | INTRAMUSCULAR | Status: DC | PRN
  Administered 2019-11-29 – 2019-12-02 (×11): 0.5 mg via INTRAVENOUS
  Filled 2019-11-28 (×11): qty 1

## 2019-11-28 MED ORDER — STROKE: EARLY STAGES OF RECOVERY BOOK: Freq: Once | Status: AC

## 2019-11-28 MED ORDER — HYDRALAZINE HCL 20 MG/ML IJ SOLN: 10.0000 mg | Freq: Four times a day (QID) | INTRAMUSCULAR | Status: DC | PRN

## 2019-11-28 MED ORDER — HYDROMORPHONE HCL 1 MG/ML IJ SOLN
0.2500 mg | Freq: Once | INTRAMUSCULAR | Status: AC
  Administered 2019-11-28: 0.25 mg via INTRAVENOUS

## 2019-11-28 MED ORDER — SODIUM CHLORIDE 0.9 % IV SOLN: INTRAVENOUS | Status: DC

## 2019-11-28 MED ORDER — HYDROMORPHONE HCL 1 MG/ML IJ SOLN
INTRAMUSCULAR | Status: AC
  Filled 2019-11-28: qty 1

## 2019-11-28 MED ORDER — IOHEXOL 350 MG/ML SOLN
75.0000 mL | Freq: Once | INTRAVENOUS | Status: AC | PRN
  Administered 2019-11-28: 75 mL via INTRAVENOUS

## 2019-11-28 MED ORDER — INSULIN ASPART 100 UNIT/ML ~~LOC~~ SOLN
0.0000 [IU] | SUBCUTANEOUS | Status: DC
  Administered 2019-11-28 (×2): 1 [IU] via SUBCUTANEOUS
  Administered 2019-11-28: 2 [IU] via SUBCUTANEOUS
  Administered 2019-11-29 (×4): 1 [IU] via SUBCUTANEOUS
  Administered 2019-11-29 – 2019-11-30 (×4): 2 [IU] via SUBCUTANEOUS

## 2019-11-28 MED ORDER — SODIUM CHLORIDE 0.9 % IV SOLN
500.0000 mg | INTRAVENOUS | Status: AC
  Administered 2019-11-28 – 2019-12-01 (×4): 500 mg via INTRAVENOUS
  Filled 2019-11-28 (×5): qty 500

## 2019-11-28 MED ORDER — NIMODIPINE 6 MG/ML PO SOLN
60.0000 mg | ORAL | Status: DC
  Administered 2019-11-29 (×2): 60 mg via ORAL
  Filled 2019-11-28 (×3): qty 10

## 2019-11-28 MED ORDER — NIMODIPINE 6 MG/ML PO SOLN
60.0000 mg | ORAL | Status: DC
  Administered 2019-11-28: 60 mg
  Filled 2019-11-28: qty 10

## 2019-11-28 MED ORDER — CLEVIDIPINE BUTYRATE 0.5 MG/ML IV EMUL: 0.0000 mg/h | INTRAVENOUS | Status: DC

## 2019-11-28 MED ORDER — DILTIAZEM HCL-DEXTROSE 125-5 MG/125ML-% IV SOLN (PREMIX)
5.0000 mg/h | INTRAVENOUS | Status: DC
  Administered 2019-11-28 – 2019-11-29 (×5): 15 mg/h via INTRAVENOUS
  Filled 2019-11-28 (×5): qty 125

## 2019-11-28 MED ORDER — NIMODIPINE 6 MG/ML PO SOLN: 60.0000 mg | ORAL | Status: DC

## 2019-11-28 MED ORDER — PROTHROMBIN COMPLEX CONC HUMAN 500 UNITS IV KIT
3712.0000 [IU] | PACK | Status: AC
  Administered 2019-11-28: 3712 [IU] via INTRAVENOUS
  Filled 2019-11-28: qty 3000

## 2019-11-28 MED ORDER — STROKE: EARLY STAGES OF RECOVERY BOOK
Freq: Once | Status: DC
  Filled 2019-11-28: qty 1

## 2019-11-28 MED ORDER — IPRATROPIUM-ALBUTEROL 0.5-2.5 (3) MG/3ML IN SOLN
3.0000 mL | Freq: Four times a day (QID) | RESPIRATORY_TRACT | Status: DC | PRN
  Administered 2019-12-03: 3 mL via RESPIRATORY_TRACT
  Filled 2019-11-28: qty 3

## 2019-11-28 MED ORDER — CHLORHEXIDINE GLUCONATE CLOTH 2 % EX PADS
6.0000 | MEDICATED_PAD | Freq: Every day | CUTANEOUS | Status: DC
  Administered 2019-11-28 – 2019-12-02 (×5): 6 via TOPICAL

## 2019-11-28 NOTE — Consult Note (Addendum)
NAME:  Nicole [name], MRN:  [id number], DOB:  06-10-1940, LOS: 1 ADMISSION DATE:  12/18/2019, CONSULTATION DATE:  11/28/2019 REFERRING MD:  Dr. Lupita Leash, CHIEF COMPLAINT:  [place]   Brief History   79 year old from rehab on Eliquis for Afib and acute DVT presenting with fever, confusion, leukocytosis admitted to [facility] with concern for sepsis found on [facility] today with acute SAH.  NSGY and PCCM consulted for further management.   History of present illness    79 year old female with prior history of HTN, HLD, CAD, Afib chronically on Eliquis (not compliant), adenocarcinoma of the lung, DMT2, OA, bronchitis, and headaches who has been residing at [facility] for rehab from fall in 09/2019 with continued back pain found to have L1/L2 compression fractures.  Additionally diagnosed with acute DVT two weeks ago.  Underwent vertebral kyphoplasty 10/29 by IR. In the past, patient reportedly non-compliant with her Eliquis.  Eliquis was restarted at nursing home on her Eliquis 10/29.   She was sent to the ER for evaluation on 11/3 with confusion, poor PO intake, nausea/ vomiting episodes, and fever 102.  Family attributed her confusion since Friday to her pain meds and possibly UTI given her prior presentations.  In Er, was found in Afib with RVR, febrile, and tachypnea with hypoxia requiring 4L O2. Treated with fluids and cardizem gtt.  WBC 13.7, UA noted for moderate leukocytes/ rare bacteria, CXR showed hypoinflation with no focal deficits.  Admitted by [facility] with concern for sepsis, cultured, and started on empiric abx.   Today, was taken for a CT head today given her ongoing confusion.  CT head showed large SAH concerning for ruptured aneurysm.  She last received Eliquis last night and this morning.  Currently patient easily awakens to voice, follows commands, and remains hemodynamically stable with SBP 120.  Pending stat CTA head/ neck and NSGY consultation. Patient was a DNR on admit, but after conversation with son at  bedside, son wishes for everything to be done for his mother at this time.    Past Medical History  HTN, HLD, CAD, Afib chronically on Eliquis (not compliant), adenocarcinoma of the lung, DMT2, OA, bronchitis, headaches   Significant Hospital Events   11/3 admitted to [facility]  Consults:  PCCM NSGY  Procedures:   Significant Diagnostic Tests:  11/3 Vascular LE doppler >>  RIGHT:  - There is no evidence of deep vein thrombosis in the lower extremity.  - No cystic structure found in the popliteal fossa.  LEFT:  - Findings consistent with acute deep vein thrombosis involving the left  common femoral vein, SF junction, left femoral vein, left proximal  profunda vein, left popliteal vein, left posterior tibial veins, and left  peroneal veins.  - No cystic structure found in the popliteal fossa.   11/3 CT chest  >> 1. Constellation of findings likely reflect pulmonary edema versus atypical infection. 2. New small to moderate LEFT pleural effusion. 3. New LEFT and mediastinal supraclavicular lymphadenopathy, nonspecific and possibly reactive in etiology. Recommend attention on follow-up. 4. New 7 mm RIGHT apical nodular versus consolidative opacity. Non-contrast chest CT at 6-12 months is recommended. If the nodule is stable at time of repeat CT, then future CT at 18-24 months (from today's scan) 5. Increased conspicuity of a RIGHT thyroid nodule in comparison to priors. Recommend nonemergent evaluation with dedicated thyroid ultrasound.  11/4 CTH >> Large amount of subarachnoid hemorrhage is noted throughout the brain, most prominently seen in the basal cisterns  and left anterior hemispheric fissure and left Sylvian fissure. Blood is also noted in the posterior horns of both lateral ventricles. Hemorrhage is also noted in the fourth ventricle. These findings are concerning for possible rupture of cerebral aneurysm. Further evaluation with MRA or CTA is recommended.  11/4 CTA head/ neck  >> No aneurysm identified. Remains highly suspicious for ruptured aneurysm and catheter angiography is recommended.  Micro Data:  11/3 SARS2 / flu  >> neg 11/3 UC >> 11/3 BCx2 >> 2/4 methicillin resistant stap epi  Antimicrobials:  11/3 cefepime >> 11/3 azithromycin >>  Interim history/subjective:   Objective   Blood pressure 135/75, pulse 90, temperature 98.2 F (36.8 C), temperature source Axillary, resp. rate (!) 21, weight 76.1 kg, SpO2 100 %.        Intake/Output Summary (Last 24 hours) at 11/28/2019 1254 Last data filed at 11/28/2019 1100 Gross per 24 hour  Intake 1498.37 ml  Output 2800 ml  Net -1301.63 ml   Filed Weights   12/14/2019 2020  Weight: 76.1 kg   Examination: General:  Chronically ill appearing elderly female lying in bed in NAD HEENT: MM pink/moist, pupils 3/non reactive Neuro: awakens to verbal, states name, follows simple commands, able to wiggle toes CV: IRIR, no murmur  PULM:  Non labored, scattered rales on right, clear on left, diminished in bases GI: soft, +bs Extremities: warm/dry, +1-2 LE edema  Skin: no rashes, bruising to arms  Resolved Hospital Problem list    Assessment & Plan:   [place]  P:  Transfer to ICU  Pending NSGY consult Stat CTA head/ neck to evaluate for aneurysmal rupture -> No aneurysm identified. Remains highly suspicious for ruptured aneurysm and catheter angiography is recommended. Eliquis reversal per pharmacy protocol with Kcentra Stat coags/ T&S NPO Neurology also consulted given acute stroke  Currently protecting airway, at risk for intubation SBP goal < 140, or if NSGY specifies otherwise    Afib RVR  P:  Tele monitoring  cardizem gtt for rate control  D/c eliquis given acute SAH   Sepsis/ SIRS - presented with fever, leukocytosis, hx of vomiting - continue azithro/ cefepime - follow cultures/ trend WBC/ fever curve  - 2/4 BC's showing 2/4 methicillin resistant stap epi, likely contaminant  - CT chest  likely pulmonary edema vs atypical infection, small to moderate left effusion, and new left and mediastinal supraclavicular lymphadenopathy, new 60mm right apical nodule vs consolidative opacity; also noted to have increased conspicuity of right thyroid nodule; will need further workup for these nodules given her hx of lung adenocarcinoma.  Consider bedside ultrasound/ possible thoracentesis for further diagnostic workup when not hypercoagulable and repeat CT after abx to evaluate for clearing - TTE and BNP (412) ordered by TRH   AKI/ NGMA- resolving  - strict I/O's  - trend BMET - avoid nephrotoxins    Acute DVT - will need IR consult for IVC filter given inability to anticoagulate   DMT2 - SSI sensitive/ CBG q 4hrs   Normocytic anemia - trend CBC  Best practice:  Diet: NPO Pain/Anxiety/Delirium protocol (if indicated): n/a VAP protocol (if indicated): n/a DVT prophylaxis:  none- given SAH and DVT GI prophylaxis:n/a Glucose control: trend on BMET Mobility: BR Code Status: DNR- Dr. Loanne Drilling spoke with son at bedside who verbalized he wanted everything done for his mother at this time, therefore her code status was changed.   Family Communication: Son updated at bedside by Dr. Loanne Drilling Disposition: tx to ICU   Labs  CBC: Recent Labs  Lab 11/22/19 0849 11/25/2019 0954 11/28/19 0137  WBC 9.5 13.7* 12.6*  NEUTROABS  --  11.6*  --   HGB 13.4 12.2 12.1  HCT 41.2 39.1 36.6  MCV 93.2 96.5 93.4  PLT 251 300 381    Basic Metabolic Panel: Recent Labs  Lab 11/22/19 0849 12/03/2019 0954 11/28/19 0137  NA 138 134* 134*  K 3.0* 4.5 4.1  CL 96* 98 99  CO2 31 16* 24  GLUCOSE 130* 261* 207*  BUN 23 30* 31*  CREATININE 0.94 1.63* 1.09*  CALCIUM 9.7 8.9 9.3   GFR: Estimated Creatinine Clearance: 45 mL/min (A) (by C-G formula based on SCr of 1.09 mg/dL (H)). Recent Labs  Lab 11/22/19 0849 12/04/2019 0954 12/03/2019 1353 11/28/19 0137  WBC 9.5 13.7*  --  12.6*  LATICACIDVEN   --  2.7* 2.2*  --     Liver Function Tests: Recent Labs  Lab 12/06/2019 0954 11/28/19 0137  AST 21 18  ALT 12 13  ALKPHOS 63 65  BILITOT 1.6* 0.7  PROT 5.9* 6.2*  ALBUMIN 2.6* 2.7*   No results for input(s): LIPASE, AMYLASE in the last 168 hours. No results for input(s): AMMONIA in the last 168 hours.  ABG    Component Value Date/Time   HCO3 20.6 12/01/2019 2024   ACIDBASEDEF 2.7 (H) 12/15/2019 2024   O2SAT 86.3 11/25/2019 2024     Coagulation Profile: Recent Labs  Lab 11/22/19 0849 12/07/2019 0954  INR 1.0 1.3*    Cardiac Enzymes: Recent Labs  Lab 12/01/2019 2024  CKTOTAL 74    HbA1C: Hemoglobin A1C  Date/Time Value Ref Range Status  06/17/2019 04:58 PM 8.8 (A) 4.0 - 5.6 % Final   Hgb A1c MFr Bld  Date/Time Value Ref Range Status  12/02/2019 08:24 PM 6.4 (H) 4.8 - 5.6 % Final    Comment:    (NOTE) Pre diabetes:          5.7%-6.4%  Diabetes:              >6.4%  Glycemic control for   <7.0% adults with diabetes   04/04/2019 05:33 AM 8.6 (H) 4.8 - 5.6 % Final    Comment:    (NOTE) Pre diabetes:          5.7%-6.4% Diabetes:              >6.4% Glycemic control for   <7.0% adults with diabetes     CBG: Recent Labs  Lab 11/22/19 1529 11/25/2019 1851 11/28/19 0004 11/28/19 0914 11/28/19 1103  GLUCAP 114* 245* 208* 164* 186*    Review of Systems:   Unable   Past Medical History  She,  has a past medical history of A-fib ([place]), Adenocarcinoma of right lung, stage 3 ([place]) (07/28/2016), Atrial flutter ([place]) (10/05/2016), Bronchitis, Cancer ([place]) (2004), Cancer-related pain (10/06/2016), Coronary artery disease (02/21/2017), Diabetes mellitus, Edema (07/12/2017), Essential hypertension (01/20/2009), Gallstones, Headache, History of radiation therapy (08/09/2016 to 09/19/2016), Hypertension, Hypertriglyceridemia (08/22/2012), Low back pain, Lung mass, Osteoarthritis, and Type II diabetes mellitus with manifestations ([place]) (01/20/2009).   Surgical History     Past Surgical History:  Procedure Laterality Date  . ABDOMINAL HYSTERECTOMY  2004  . APPENDECTOMY  when 79 years old  . COLONOSCOPY  08-05-09   [name]  . I & D EXTREMITY Right 04/04/2019   Procedure: IRRIGATION AND DEBRIDEMENT RIGHT HAND;  Surgeon: Verner Mould, MD;  Location: [facility] ORS;  Service: Orthopedics;  Laterality: Right;  . INCONTINENCE  SURGERY    . IR KYPHO EA ADDL LEVEL THORACIC OR LUMBAR  11/22/2019  . IR KYPHO LUMBAR INC FX REDUCE BONE BX UNI/BIL CANNULATION INC/IMAGING  11/22/2019  . IR RADIOLOGIST EVAL & MGMT  11/13/2019  . KNEE ARTHROSCOPY Left   . LEFT HEART CATH AND CORONARY ANGIOGRAPHY N/A 02/24/2017   Procedure: LEFT HEART CATH AND CORONARY ANGIOGRAPHY;  Surgeon: Jettie Booze, MD;  Location: [facility];  Service: Cardiovascular;  Laterality: N/A;  . LEFT HEART CATHETERIZATION WITH CORONARY ANGIOGRAM N/A 07/19/2013   Procedure: LEFT HEART CATHETERIZATION WITH CORONARY ANGIOGRAM;  Surgeon: Sinclair Grooms, MD;  Location: [facility] CATH LAB;  Service: Cardiovascular;  Laterality: N/A;  . POLYPECTOMY  08-05-09   2 polyps  . TOTAL KNEE ARTHROPLASTY Left 03/28/2012   Procedure: LEFT TOTAL KNEE ARTHROPLASTY;  Surgeon: Tobi Bastos, MD;  Location: [facility];  Service: Orthopedics;  Laterality: Left;  . TUBAL LIGATION    . VIDEO BRONCHOSCOPY WITH ENDOBRONCHIAL ULTRASOUND N/A 07/11/2016   Procedure: VIDEO BRONCHOSCOPY WITH ENDOBRONCHIAL ULTRASOUND;  Surgeon: Marshell Garfinkel, MD;  Location: [place];  Service: Pulmonary;  Laterality: N/A;     Social History   reports that she has never smoked. She has never used smokeless tobacco. She reports that she does not drink alcohol and does not use drugs.   Family History   Her family history includes Arthritis in an other family member; Cancer in an other family member; Diabetes in an other family member; Heart attack in her father; Heart disease in her father; Hyperlipidemia in an other family member; Hypertension in an  other family member. There is no history of Colon cancer.   Allergies No Known Allergies   Home Medications  Prior to Admission medications   Medication Sig Start Date End Date Taking? Authorizing Provider  acetaminophen (TYLENOL) 325 MG tablet Take 650 mg by mouth every 6 (six) hours as needed for headache.   Yes [provider]  b complex vitamins tablet Take 1 tablet by mouth daily.   Yes [provider]  CALCIUM-VITAMIN D-VITAMIN K PO Take 2 tablets by mouth daily with breakfast. Calcium 500 mg vitamin d 200 unit vitamin k 40 mg    Yes [provider]  Collagenase POWD Apply 1 application topically daily as needed (skin care).    Yes [provider]  diltiazem (CARDIZEM CD) 360 MG 24 hr capsule TAKE 1 CAPSULE(360 MG) BY MOUTH DAILY Patient taking differently: Take 360 mg by mouth daily.  07/09/19  Yes Jettie Booze, MD  docusate sodium (COLACE) 100 MG capsule Take 100 mg by mouth daily.   Yes [provider]  ELIQUIS 5 MG TABS tablet Take 5 mg by mouth 2 (two) times daily.  09/25/19  Yes [provider]  insulin glargine, 2 Unit Dial, (TOUJEO MAX SOLOSTAR) 300 UNIT/ML Solostar Pen Inject 30 Units into the skin daily. Patient taking differently: Inject 20 Units into the skin daily.  10/14/19  Yes Burns, Claudina Lick, MD  LACTOBACILLUS PO Take 1 capsule by mouth daily. 3 billion cell   Yes [provider]  Lidocaine 4 % PTCH Apply 1 patch topically daily. Apply 1 patch in the morning, remove at night.   Yes [provider]  Lidocaine-Menthol 4-1 % PTCH Apply 1 patch topically daily. Apply to lower back   Yes [provider]  metFORMIN (GLUCOPHAGE) 500 MG tablet Take 1,000 mg by mouth daily with breakfast.   Yes [provider]  methocarbamol (ROBAXIN) 500 MG tablet Take 500 mg by mouth daily.   Yes [provider]  metoprolol tartrate (LOPRESSOR) 25 MG tablet Take 1 tablet every 8 hours AS NEEDED  for rapid heart rate Patient taking differently: Take 25 mg by mouth every 8 (eight) hours as needed (hypertention).  08/01/18  Yes Fenton, Clint R, PA  Multiple Vitamins-Minerals (WOMENS MULTIVITAMIN PO) Take 1 tablet by mouth daily.   Yes [provider]  Omega-3 Fatty Acids (OMEGA III EPA+DHA PO) Take 1 capsule by mouth daily. Fish oil 1200 mg, dha 144 mg, epa 216 mg    Yes [provider]  rosuvastatin (CRESTOR) 5 MG tablet TAKE 1 TABLET(5 MG) BY MOUTH DAILY Patient taking differently: TAKE 1 TABLET(5 MG) BY MOUTH DAILY 10/07/19  Yes [name], MD  torsemide (DEMADEX) 20 MG tablet Take 1 tablet (20 mg total) by mouth every other day. Patient taking differently: Take 20 mg by mouth daily.  08/28/19  Yes Fenton, Clint R, PA  traMADol (ULTRAM) 50 MG tablet Take 25 mg by mouth 2 (two) times daily as needed for moderate pain.   Yes [provider]  witch hazel-glycerin (TUCKS) pad Apply 1 application topically 3 (three) times daily.   Yes [provider]  CONTOUR NEXT TEST test strip USE 1 STRIP TO CHECK GLUCOSE TWICE DAILY 12/25/18   [name], MD  glucose blood test strip Contour Next Test Strips  USE 1 STRIP TO CHECK GLUCOSE TWICE DAILY    [provider]  Insulin Pen Needle (NOVOFINE) 32G X 6 MM MISC 1 Act by Does not apply route daily. 06/17/19   [name], MD  Lancets (ACCU-CHEK SOFT [facility]) lancets Use to check blood sugars daily Dx E11.9 06/18/19   [name], MD  irbesartan (AVAPRO) 300 MG tablet Take 1 tablet (300 mg total) by mouth daily. 12/12/18   [name], MD     Critical care time: 60 mins     Kennieth Rad, ACNP [facility] 11/28/2019, 3:23 PM  See Shea Evans for personal pager PCCM on call pager [phone]

## 2019-11-28 NOTE — Progress Notes (Signed)
No urine output noted since she was admitted to the floor .Bladder scan done by T RN & obtained >900.MD on call made aware & ordered to do In &out cath.& done with 2 nurses  Under sterile technique & obtained almost 2L.MD on call made aware.

## 2019-11-28 NOTE — Consult Note (Addendum)
Reason for Consult: Subarachnoid hemorrhage Referring Physician: Internal medicine  Nicole [name] is an 79 y.o. female.  HPI: 79 year old female admitted yesterday for work-up of confusion/delirium.  Patient recently status post lumbar kyphoplasty by another physician.  Patient also on chronic anticoagulation for treatment of a left lower extremity DVT.  No known history of trauma or other event.  Patient was admitted and metabolic work-up was undertaken.  Work-up demonstrates evidence of pneumonia.  Patient is somewhat febrile.  She has been started on antibiotics and oxygen.  Her delirium was further evaluated with a head CT scan.  This demonstrated evidence of a diffuse subarachnoid hemorrhage within her basilar cisterns and interhemispheric space.  There is no evidence of any intraparenchymal bleeding.  The patient subsequently underwent CT angiogram which demonstrated normal arterial anatomy without evidence of aneurysm, vascular malformation or other obvious source of hemorrhage.  The patient had been on anticoagulation with Eliquis.  This was reversed.  The patient has been transferred to the intensive care unit.  She has no known history of trauma.  Past Medical History:  Diagnosis Date  . A-fib ([place])   . 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 Atrial flutter ([place]) 10/05/2016  . Bronchitis    hx of  . 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 Cancer-related pain 10/06/2016  . Coronary artery disease 02/21/2017   JAN 2019 Prox RCA lesion is 25% stenosed. Prox Cx lesion is 30% stenosed. Mid Cx lesion is 25% stenosed. Ost 3rd Mrg lesion is 30% stenosed. Prox LAD lesion is 25% stenosed. Mid LAD lesion is 25% stenosed. The left ventricular systolic function is normal. LV end diastolic pressure is normal. The left ventricular ejection fraction is 55-65% by visual estimate. There is no aortic valve stenosis.   N  . Diabetes mellitus    type 2  . Edema 07/12/2017  . Essential  hypertension 01/20/2009  . Gallstones   . Headache   . History of radiation therapy 08/09/2016 to 09/19/2016   Right lung was treated to 60 Gy in 30 fractions at 2 Gy per fraction  . Hypertension   . Hypertriglyceridemia 08/22/2012  . Low back pain   . Lung mass    with lymphadenopathy  . Osteoarthritis   . Type II diabetes mellitus with manifestations ([place]) 01/20/2009   Estimated Creatinine Clearance: 49.8 mL/min (by C-G formula based on SCr of 1 mg/dL).    Past Surgical History:  Procedure Laterality Date  . ABDOMINAL HYSTERECTOMY  2004  . APPENDECTOMY  when 79 years old  . COLONOSCOPY  08-05-09   [name]  . I & D EXTREMITY Right 04/04/2019   Procedure: IRRIGATION AND DEBRIDEMENT RIGHT HAND;  Surgeon: Verner Mould, MD;  Location: [facility];  Service: Orthopedics;  Laterality: Right;  . INCONTINENCE SURGERY    . IR KYPHO EA ADDL LEVEL THORACIC OR LUMBAR  11/22/2019  . IR KYPHO LUMBAR INC FX REDUCE BONE BX UNI/BIL CANNULATION INC/IMAGING  11/22/2019  . IR RADIOLOGIST EVAL & MGMT  11/13/2019  . KNEE ARTHROSCOPY Left   . LEFT HEART CATH AND CORONARY ANGIOGRAPHY N/A 02/24/2017   Procedure: LEFT HEART CATH AND CORONARY ANGIOGRAPHY;  Surgeon: Jettie Booze, MD;  Location: [place] CV LAB;  Service: Cardiovascular;  Laterality: N/A;  . LEFT HEART CATHETERIZATION WITH CORONARY ANGIOGRAM N/A 07/19/2013   Procedure: LEFT HEART CATHETERIZATION WITH CORONARY ANGIOGRAM;  Surgeon: Sinclair Grooms, MD;  Location: [facility] CATH LAB;  Service: Cardiovascular;  Laterality: N/A;  . POLYPECTOMY  08-05-09   2 polyps  . TOTAL KNEE ARTHROPLASTY Left 03/28/2012   Procedure: LEFT TOTAL KNEE ARTHROPLASTY;  Surgeon: Tobi Bastos, MD;  Location: [facility];  Service: Orthopedics;  Laterality: Left;  . TUBAL LIGATION    . VIDEO BRONCHOSCOPY WITH ENDOBRONCHIAL ULTRASOUND N/A 07/11/2016   Procedure: VIDEO BRONCHOSCOPY WITH ENDOBRONCHIAL ULTRASOUND;  Surgeon: Marshell Garfinkel, MD;  Location: [place];   Service: Pulmonary;  Laterality: N/A;    Family History  Problem Relation Age of Onset  . Heart attack Father   . Heart disease Father   . Arthritis Other   . Cancer Other        colon, lst degree relative  . Diabetes Other        st degree relative  . Hyperlipidemia Other   . Hypertension Other   . Colon cancer Neg Hx     Social History:  reports that she has never smoked. She has never used smokeless tobacco. She reports that she does not drink alcohol and does not use drugs.  Allergies: No Known Allergies  Medications: I have reviewed the patient's current medications.  Results for orders placed or performed during the hospital encounter of 12/22/2019 (from the past 48 hour(s))  Blood Culture (routine x 2)     Status: None (Preliminary result)   Collection Time: 12/04/2019  9:35 AM   Specimen: BLOOD  Result Value Ref Range   Specimen Description BLOOD LEFT ANTECUBITAL    Special Requests      BOTTLES DRAWN AEROBIC AND ANAEROBIC Blood Culture results may not be optimal due to an inadequate volume of blood received in culture bottles   Culture      NO GROWTH < 24 HOURS Performed at [facility] [address] [place], [place] [address]    Report Status PENDING   Blood Culture (routine x 2)     Status: None (Preliminary result)   Collection Time: 12/01/2019  9:40 AM   Specimen: BLOOD RIGHT WRIST  Result Value Ref Range   Specimen Description BLOOD RIGHT WRIST    Special Requests      BOTTLES DRAWN AEROBIC AND ANAEROBIC Blood Culture results may not be optimal due to an inadequate volume of blood received in culture bottles   Culture  Setup Time      GRAM POSITIVE COCCI IN CLUSTERS IN BOTH AEROBIC AND ANAEROBIC BOTTLES CRITICAL RESULT CALLED TO, READ BACK BY AND VERIFIED WITH: PHARMD A [place] [id number] AT 737 AM BY CM Performed at [facility], [place] [address], [place], [place] [address]    Culture GRAM POSITIVE COCCI    Report Status PENDING   Blood Culture ID  Panel (Reflexed)     Status: Abnormal   Collection Time: 12/01/2019  9:40 AM  Result Value Ref Range   Enterococcus faecalis NOT DETECTED NOT DETECTED   Enterococcus Faecium NOT DETECTED NOT DETECTED   Listeria monocytogenes NOT DETECTED NOT DETECTED   Staphylococcus species DETECTED (A) NOT DETECTED   Staphylococcus aureus (BCID) NOT DETECTED NOT DETECTED   Staphylococcus epidermidis DETECTED (A) NOT DETECTED    Comment: Methicillin (oxacillin) resistant coagulase negative staphylococcus. Possible blood culture contaminant (unless isolated from more than one blood culture draw or clinical case suggests pathogenicity). No antibiotic treatment is indicated for blood  culture contaminants. CRITICAL RESULT CALLED TO, READ BACK BY AND VERIFIED WITH: PHARMD A SULLIVAN [id number] AT 737 AM BY CM    Staphylococcus lugdunensis NOT  DETECTED NOT DETECTED   Streptococcus species NOT DETECTED NOT DETECTED   Streptococcus agalactiae NOT DETECTED NOT DETECTED   Streptococcus pneumoniae NOT DETECTED NOT DETECTED   Streptococcus pyogenes NOT DETECTED NOT DETECTED   A.calcoaceticus-baumannii NOT DETECTED NOT DETECTED   Bacteroides fragilis NOT DETECTED NOT DETECTED   Enterobacterales NOT DETECTED NOT DETECTED   Enterobacter cloacae complex NOT DETECTED NOT DETECTED   Escherichia coli NOT DETECTED NOT DETECTED   Klebsiella aerogenes NOT DETECTED NOT DETECTED   Klebsiella oxytoca NOT DETECTED NOT DETECTED   Klebsiella pneumoniae NOT DETECTED NOT DETECTED   Proteus species NOT DETECTED NOT DETECTED   Salmonella species NOT DETECTED NOT DETECTED   Serratia marcescens NOT DETECTED NOT DETECTED   Haemophilus influenzae NOT DETECTED NOT DETECTED   Neisseria meningitidis NOT DETECTED NOT DETECTED   Pseudomonas aeruginosa NOT DETECTED NOT DETECTED   Stenotrophomonas maltophilia NOT DETECTED NOT DETECTED   Candida albicans NOT DETECTED NOT DETECTED   Candida auris NOT DETECTED NOT DETECTED   Candida glabrata NOT  DETECTED NOT DETECTED   Candida krusei NOT DETECTED NOT DETECTED   Candida parapsilosis NOT DETECTED NOT DETECTED   Candida tropicalis NOT DETECTED NOT DETECTED   Cryptococcus neoformans/gattii NOT DETECTED NOT DETECTED   Methicillin resistance mecA/C DETECTED (A) NOT DETECTED    Comment: CRITICAL RESULT CALLED TO, READ BACK BY AND VERIFIED WITH: PHARMD A SULLIVAN [id number] AT 737 AM BY CM Performed at [facility], [address] [place], [place] [address]   Lactic acid, plasma     Status: Abnormal   Collection Time: 12/02/2019  9:54 AM  Result Value Ref Range   Lactic Acid, Venous 2.7 (HH) 0.5 - 1.9 mmol/L    Comment: CRITICAL RESULT CALLED TO, READ BACK BY AND VERIFIED WITH: LESLIE SHULAR,RN AT 1102 12/03/2019 BY ZBEECH. Performed at [facility], [place] [address] [place], [place] [address]   Comprehensive metabolic panel     Status: Abnormal   Collection Time: 11/28/2019  9:54 AM  Result Value Ref Range   Sodium 134 (L) 135 - 145 mmol/L   Potassium 4.5 3.5 - 5.1 mmol/L   Chloride 98 98 - 111 mmol/L   CO2 16 (L) 22 - 32 mmol/L   Glucose, Bld 261 (H) 70 - 99 mg/dL    Comment: Glucose reference range applies only to samples taken after fasting for at least 8 hours.   BUN 30 (H) 8 - 23 mg/dL   Creatinine, Ser 1.63 (H) 0.44 - 1.00 mg/dL   Calcium 8.9 8.9 - 10.3 mg/dL   Total Protein 5.9 (L) 6.5 - 8.1 g/dL   Albumin 2.6 (L) 3.5 - 5.0 g/dL   AST 21 15 - 41 U/L   ALT 12 0 - 44 U/L   Alkaline Phosphatase 63 38 - 126 U/L   Total Bilirubin 1.6 (H) 0.3 - 1.2 mg/dL   GFR, Estimated 32 (L) >60 mL/min    Comment: (NOTE) Calculated using the CKD-EPI Creatinine Equation (2021)    Anion gap 20 (H) 5 - 15    Comment: Performed at [facility], [place] [address] [place], [place] [address]  CBC WITH DIFFERENTIAL     Status: Abnormal   Collection Time: 12/05/2019  9:54 AM  Result Value Ref Range   WBC 13.7 (H) 4.0 - 10.5 K/uL   RBC 4.05 3.87 - 5.11 MIL/uL   Hemoglobin 12.2 12.0 -  15.0 g/dL   HCT 39.1 36 - 46 %   MCV  96.5 80.0 - 100.0 fL   MCH 30.1 26.0 - 34.0 pg   MCHC 31.2 30.0 - 36.0 g/dL   RDW 14.8 11.5 - 15.5 %   Platelets 300 150 - 400 K/uL   nRBC 0.0 0.0 - 0.2 %   Neutrophils Relative % 85 %   Neutro Abs 11.6 (H) 1.7 - 7.7 K/uL   Lymphocytes Relative 4 %   Lymphs Abs 0.6 (L) 0.7 - 4.0 K/uL   Monocytes Relative 10 %   Monocytes Absolute 1.4 (H) 0.1 - 1.0 K/uL   Eosinophils Relative 0 %   Eosinophils Absolute 0.0 0.0 - 0.5 K/uL   Basophils Relative 0 %   Basophils Absolute 0.0 0.0 - 0.1 K/uL   Immature Granulocytes 1 %   Abs Immature Granulocytes 0.14 (H) 0.00 - 0.07 K/uL    Comment: Performed at [facility] [address], [place], [place] [address]  Protime-INR     Status: Abnormal   Collection Time: 12/14/2019  9:54 AM  Result Value Ref Range   Prothrombin Time 15.9 (H) 11.4 - 15.2 seconds   INR 1.3 (H) 0.8 - 1.2    Comment: (NOTE) INR goal varies based on device and disease states. Performed at [facility], [employer] [employer] [address] [place], [place] [address]   APTT     Status: None   Collection Time: 12/23/2019  9:54 AM  Result Value Ref Range   aPTT 32 24 - 36 seconds    Comment: Performed at [facility] [address] [place], [place] [address]  Lactic acid, plasma     Status: Abnormal   Collection Time: 11/26/2019  1:53 PM  Result Value Ref Range   Lactic Acid, Venous 2.2 (HH) 0.5 - 1.9 mmol/L    Comment: CRITICAL VALUE NOTED.  VALUE IS CONSISTENT WITH PREVIOUSLY REPORTED AND CALLED VALUE. Performed at [facility], [place] [address] [place], [place] [address]   Urinalysis, Routine w reflex microscopic Urine, Random     Status: Abnormal   Collection Time: 12/02/2019  1:53 PM  Result Value Ref Range   Color, Urine YELLOW YELLOW   APPearance CLOUDY (A) CLEAR   Specific Gravity, Urine 1.025 1.005 - 1.030   pH 5.5 5.0 - 8.0   Glucose, UA NEGATIVE NEGATIVE mg/dL   Hgb urine dipstick LARGE (A) NEGATIVE   Bilirubin Urine  SMALL (A) NEGATIVE   Ketones, ur 40 (A) NEGATIVE mg/dL   Protein, ur NEGATIVE NEGATIVE mg/dL   Nitrite NEGATIVE NEGATIVE   Leukocytes,Ua MODERATE (A) NEGATIVE    Comment: Performed at [facility] [address], [place], [place] [address]  Urinalysis, Microscopic (reflex)     Status: Abnormal   Collection Time: 11/25/2019  1:53 PM  Result Value Ref Range   RBC / HPF 6-10 0 - 5 RBC/hpf   WBC, UA 0-5 0 - 5 WBC/hpf   Bacteria, UA RARE (A) NONE SEEN   Squamous Epithelial / LPF 0-5 0 - 5    Comment: Performed at [facility], [place] [address] [place], [place] [address]  Respiratory Panel by RT PCR (Flu A&B, Covid) - Nasopharyngeal Swab     Status: None   Collection Time: 12/22/2019  2:03 PM   Specimen: Nasopharyngeal Swab  Result Value Ref Range   SARS Coronavirus 2 by RT PCR NEGATIVE NEGATIVE    Comment: (NOTE) SARS-CoV-2 target nucleic acids are NOT DETECTED.  The SARS-CoV-2 RNA is generally detectable in  upper respiratoy specimens during the acute phase of infection. The lowest concentration of SARS-CoV-2 viral copies this assay can detect is 131 copies/mL. A negative result does not preclude SARS-Cov-2 infection and should not be used as the sole basis for treatment or other patient management decisions. A negative result may occur with  improper specimen collection/handling, submission of specimen other than nasopharyngeal swab, presence of viral mutation(s) within the areas targeted by this assay, and inadequate number of viral copies (<131 copies/mL). A negative result must be combined with clinical observations, patient history, and epidemiological information. The expected result is Negative.  Fact Sheet for Patients:  PinkCheek.be  Fact Sheet for Healthcare Providers:  GravelBags.it  This test is no t yet approved or cleared by the [place] FDA and  has been authorized for detection and/or diagnosis  of SARS-CoV-2 by FDA under an Emergency Use Authorization (EUA). This EUA will remain  in effect (meaning this test can be used) for the duration of the COVID-19 declaration under Section 564(b)(1) of the Act, 21 U.S.C. section 360bbb-3(b)(1), unless the authorization is terminated or revoked sooner.     Influenza A by PCR NEGATIVE NEGATIVE   Influenza B by PCR NEGATIVE NEGATIVE    Comment: (NOTE) The Xpert Xpress SARS-CoV-2/FLU/RSV assay is intended as an aid in  the diagnosis of influenza from Nasopharyngeal swab specimens and  should not be used as a sole basis for treatment. Nasal washings and  aspirates are unacceptable for Xpert Xpress SARS-CoV-2/FLU/RSV  testing.  Fact Sheet for Patients: PinkCheek.be  Fact Sheet for Healthcare Providers: GravelBags.it  This test is not yet approved or cleared by the [place] FDA and  has been authorized for detection and/or diagnosis of SARS-CoV-2 by  FDA under an Emergency Use Authorization (EUA). This EUA will remain  in effect (meaning this test can be used) for the duration of the  Covid-19 declaration under Section 564(b)(1) of the Act, 21  U.S.C. section 360bbb-3(b)(1), unless the authorization is  terminated or revoked. Performed at [facility], [place] [address], [place], [place] [address]   CBG monitoring, ED     Status: Abnormal   Collection Time: 12/23/2019  6:51 PM  Result Value Ref Range   Glucose-Capillary 245 (H) 70 - 99 mg/dL    Comment: Glucose reference range applies only to samples taken after fasting for at least 8 hours.   Comment 1 Notify RN    Comment 2 Document in Chart   CK     Status: None   Collection Time: 12/04/2019  8:24 PM  Result Value Ref Range   Total CK 74 38.0 - 234.0 U/L    Comment: Performed at [facility], [place] [address] [place], [place] [address]  Blood gas, venous     Status: Abnormal   Collection Time: 11/25/2019  8:24 PM   Result Value Ref Range   FIO2 28.00    pH, Ven 7.466 (H) 7.25 - 7.43   pCO2, Ven 28.8 (L) 44 - 60 mmHg   pO2, Ven 50.0 (H) 32 - 45 mmHg   Bicarbonate 20.6 20.0 - 28.0 mmol/L   Acid-base deficit 2.7 (H) 0.0 - 2.0 mmol/L   O2 Saturation 86.3 %   Patient temperature 36.5    Drawn by 1437    Sample type VENOUS     Comment: Performed at [facility] [address], [place], [place] [address]  Hemoglobin A1c     Status: Abnormal  Collection Time: 12/23/2019  8:24 PM  Result Value Ref Range   Hgb A1c MFr Bld 6.4 (H) 4.8 - 5.6 %    Comment: (NOTE) Pre diabetes:          5.7%-6.4%  Diabetes:              >6.4%  Glycemic control for   <7.0% adults with diabetes    Mean Plasma Glucose 136.98 mg/dL    Comment: Performed at [facility] [address] [place], [place] [address]  Glucose, capillary     Status: Abnormal   Collection Time: 11/28/19 12:04 AM  Result Value Ref Range   Glucose-Capillary 208 (H) 70 - 99 mg/dL    Comment: Glucose reference range applies only to samples taken after fasting for at least 8 hours.  CBC     Status: Abnormal   Collection Time: 11/28/19  1:37 AM  Result Value Ref Range   WBC 12.6 (H) 4.0 - 10.5 K/uL   RBC 3.92 3.87 - 5.11 MIL/uL   Hemoglobin 12.1 12.0 - 15.0 g/dL   HCT 36.6 36 - 46 %   MCV 93.4 80.0 - 100.0 fL   MCH 30.9 26.0 - 34.0 pg   MCHC 33.1 30.0 - 36.0 g/dL   RDW 14.9 11.5 - 15.5 %   Platelets 293 150 - 400 K/uL   nRBC 0.0 0.0 - 0.2 %    Comment: Performed at [facility], [place] [address], [place], [place] [address]  Comprehensive metabolic panel     Status: Abnormal   Collection Time: 11/28/19  1:37 AM  Result Value Ref Range   Sodium 134 (L) 135 - 145 mmol/L   Potassium 4.1 3.5 - 5.1 mmol/L   Chloride 99 98 - 111 mmol/L   CO2 24 22 - 32 mmol/L   Glucose, Bld 207 (H) 70 - 99 mg/dL    Comment: Glucose reference range applies only to samples taken after fasting for at least 8 hours.   BUN 31 (H) 8 - 23 mg/dL    Creatinine, Ser 1.09 (H) 0.44 - 1.00 mg/dL   Calcium 9.3 8.9 - 10.3 mg/dL   Total Protein 6.2 (L) 6.5 - 8.1 g/dL   Albumin 2.7 (L) 3.5 - 5.0 g/dL   AST 18 15 - 41 U/L   ALT 13 0 - 44 U/L   Alkaline Phosphatase 65 38 - 126 U/L   Total Bilirubin 0.7 0.3 - 1.2 mg/dL   GFR, Estimated 52 (L) >60 mL/min    Comment: (NOTE) Calculated using the CKD-EPI Creatinine Equation (2021)    Anion gap 11 5 - 15    Comment: Performed at [facility], [place] [address], [place], [place] [address]  Glucose, capillary     Status: Abnormal   Collection Time: 11/28/19  9:14 AM  Result Value Ref Range   Glucose-Capillary 164 (H) 70 - 99 mg/dL    Comment: Glucose reference range applies only to samples taken after fasting for at least 8 hours.  Glucose, capillary     Status: Abnormal   Collection Time: 11/28/19 11:03 AM  Result Value Ref Range   Glucose-Capillary 186 (H) 70 - 99 mg/dL    Comment: Glucose reference range applies only to samples taken after fasting for at least 8 hours.  Brain natriuretic peptide     Status: Abnormal   Collection Time: 11/28/19 12:35 PM  Result Value Ref Range   B Natriuretic Peptide 412.3 (H) 0.0 -  100.0 pg/mL    Comment: Performed at [facility], [place] [address] [place], [place] [address]  TSH     Status: Abnormal   Collection Time: 11/28/19 12:35 PM  Result Value Ref Range   TSH 0.293 (L) 0.350 - 4.500 uIU/mL    Comment: Performed by a 3rd Generation assay with a functional sensitivity of <=0.01 uIU/mL. Performed at [facility], [place] [address] [place], [place] [address]   Vitamin B12     Status: Abnormal   Collection Time: 11/28/19 12:35 PM  Result Value Ref Range   Vitamin B-12 5,165 (H) 180 - 914 pg/mL    Comment: (NOTE) This assay is not validated for testing neonatal or myeloproliferative syndrome specimens for Vitamin B12 levels. Performed at [facility], [place] [address], [place], [place] [address]   CBC     Status: Abnormal    Collection Time: 11/28/19 12:35 PM  Result Value Ref Range   WBC 12.9 (H) 4.0 - 10.5 K/uL   RBC 3.76 (L) 3.87 - 5.11 MIL/uL   Hemoglobin 11.4 (L) 12.0 - 15.0 g/dL   HCT 34.8 (L) 36 - 46 %   MCV 92.6 80.0 - 100.0 fL   MCH 30.3 26.0 - 34.0 pg   MCHC 32.8 30.0 - 36.0 g/dL   RDW 14.8 11.5 - 15.5 %   Platelets 271 150 - 400 K/uL   nRBC 0.0 0.0 - 0.2 %    Comment: Performed at [facility], [place] [address], [place], [place] [address]  Protime-INR     Status: Abnormal   Collection Time: 11/28/19  1:20 PM  Result Value Ref Range   Prothrombin Time 19.0 (H) 11.4 - 15.2 seconds   INR 1.7 (H) 0.8 - 1.2    Comment: (NOTE) INR goal varies based on device and disease states. Performed at [facility], [place] [address], [place], [place] [address]   APTT     Status: None   Collection Time: 11/28/19  1:20 PM  Result Value Ref Range   aPTT 34 24 - 36 seconds    Comment: Performed at [facility] [address] [place], [place] [address]  Basic metabolic panel     Status: Abnormal   Collection Time: 11/28/19  1:20 PM  Result Value Ref Range   Sodium 135 135 - 145 mmol/L   Potassium 3.3 (L) 3.5 - 5.1 mmol/L   Chloride 100 98 - 111 mmol/L   CO2 24 22 - 32 mmol/L   Glucose, Bld 205 (H) 70 - 99 mg/dL    Comment: Glucose reference range applies only to samples taken after fasting for at least 8 hours.   BUN 27 (H) 8 - 23 mg/dL   Creatinine, Ser 0.93 0.44 - 1.00 mg/dL   Calcium 8.7 (L) 8.9 - 10.3 mg/dL   GFR, Estimated >60 >60 mL/min    Comment: (NOTE) Calculated using the CKD-EPI Creatinine Equation (2021)    Anion gap 11 5 - 15    Comment: Performed at [facility] [address], [place], [place] [address]  Type and screen [place]     Status: None   Collection Time: 11/28/19  1:25 PM  Result Value Ref Range   ABO/RH(D) O POS    Antibody Screen NEG    Sample Expiration      12/01/2019,2359 Performed at [facility], [place] [address] [place],  [address]  Glucose, capillary     Status: Abnormal   Collection Time: 11/28/19  3:36 PM  Result Value Ref Range   Glucose-Capillary 216 (H) 70 - 99 mg/dL    Comment: Glucose reference range applies only to samples taken after fasting for at least 8 hours.    CT HEAD WO CONTRAST  Addendum Date: 11/28/2019   ADDENDUM REPORT: 11/28/2019 12:22 ADDENDUM: Critical Value/emergent results were called by telephone at the time of interpretation on 11/28/2019 at 12:22 pm to provider [facility] , who verbally acknowledged these results. Electronically Signed   By: Marijo Conception M.D.   On: 11/28/2019 12:22   Result Date: 11/28/2019 CLINICAL DATA:  [place]. EXAM: CT HEAD WITHOUT CONTRAST TECHNIQUE: Contiguous axial images were obtained from the base of the skull through the vertex without intravenous contrast. COMPARISON:  None. FINDINGS: Brain: Large amount of subarachnoid hemorrhage is noted throughout the brain, most prominently seen in the basal cisterns and left anterior hemispheric fissure and left sylvian fissure. Blood is also noted in the posterior horns of both lateral ventricles. Hemorrhage is also noted in the fourth ventricle. No ventricular dilatation is noted. No midline shift is noted. Subarachnoid hemorrhage is also noted in the para falcine and posterior parietal regions bilaterally. These findings are concerning for possible rupture of cerebral aneurysm. Vascular: No hyperdense vessel or unexpected calcification. Skull: Normal. Negative for fracture or focal lesion. Sinuses/Orbits: No acute finding. Other: None. IMPRESSION: Large amount of subarachnoid hemorrhage is noted throughout the brain, most prominently seen in the basal cisterns and left anterior hemispheric fissure and left Sylvian fissure. Blood is also noted in the posterior horns of both lateral ventricles. Hemorrhage is also noted in the fourth ventricle. These findings are concerning for possible rupture of cerebral  aneurysm. Further evaluation with MRA or CTA is recommended. Electronically Signed: By: Marijo Conception M.D. On: 11/28/2019 12:19   CT CHEST WO CONTRAST  Result Date: 12/06/2019 CLINICAL DATA:  Pneumonia EXAM: CT CHEST WITHOUT CONTRAST TECHNIQUE: Multidetector CT imaging of the chest was performed following the standard protocol without IV contrast. COMPARISON:  July 18, 2019, May 07, 2018 FINDINGS: Cardiovascular: Enlarged cardiomediastinal silhouette. Three-vessel coronary artery atherosclerotic calcifications. Atherosclerotic calcifications of the aorta. No pericardial effusion. Bovine aortic arch. Mediastinum/Nodes: LEFT supraclavicular lymph node is increased in size in comparison to prior and measures 9 mm in the short axis, previously 5 mm (series 3, image 19). There are multiple adjacent prominent LEFT supraclavicular lymph nodes which have increased in size in comparison since 2020. Aortopulmonic lymph node measures 9 mm in the short axis, new since prior (series 3, image 56). Multiple thyroid nodules, the largest of which measures approximately 15 mm. Lungs/Pleura: Small to moderate LEFT pleural effusion. Evaluation of the parenchyma is limited secondary to respiratory motion. New RIGHT apical nodular versus consolidative opacity measuring 7 mm (series 3, image 19). Interlobular septal thickening with perivascular and peripheral predominant ground-glass opacities. Additional consolidative opacity within the RIGHT upper lobe is new since prior. Bibasilar atelectasis. Consolidative opacity along the RIGHT paramediastinal inferior border with associated bronchiectasis is unchanged. Upper Abdomen: Respiratory motion limits evaluation. Status post cholecystectomy. Small hiatal hernia. Musculoskeletal: No acute osseous abnormality. Degenerative changes of the thoracic spine. IMPRESSION: 1. Constellation of findings likely reflect pulmonary edema versus atypical infection. 2. New small to moderate LEFT  pleural effusion. 3. New LEFT and mediastinal supraclavicular lymphadenopathy, nonspecific and possibly reactive in etiology. Recommend attention on follow-up. 4. New 7 mm RIGHT apical nodular versus consolidative  opacity. Non-contrast chest CT at 6-12 months is recommended. If the nodule is stable at time of repeat CT, then future CT at 18-24 months (from today's scan) is considered optional for low-risk patients, but is recommended for high-risk patients. This recommendation follows the consensus statement: Guidelines for Management of Incidental Pulmonary Nodules Detected on CT Images: From the Fleischner Society 2017; Radiology 2017; 284:228-243. 5. Increased conspicuity of a RIGHT thyroid nodule in comparison to priors. Recommend nonemergent evaluation with dedicated thyroid ultrasound. Aortic Atherosclerosis (ICD10-I70.0). Electronically Signed   By: Valentino Saxon MD   On: 12/02/2019 16:17   DG Chest Port 1 View  Result Date: 12/01/2019 CLINICAL DATA:  Questionable sepsis - evaluate for abnormality EXAM: PORTABLE CHEST 1 VIEW COMPARISON:  07/29/2018 chest radiograph and prior. 07/18/2019 CT chest. FINDINGS: No pneumothorax, focal consolidation or pleural effusion. Cardiomediastinal silhouette within normal limits. Prominence of the perihilar vessels secondary to hypoinflation. Aortic atherosclerotic calcifications. No acute osseous abnormality. IMPRESSION: No focal airspace disease.  Hypoinflated lungs. Electronically Signed   By: Primitivo Gauze M.D.   On: 12/14/2019 10:26   ECHOCARDIOGRAM COMPLETE  Result Date: 11/28/2019    ECHOCARDIOGRAM REPORT   Patient Name:   Nicole [name] Date of Exam: 11/28/2019 Medical Rec #:  [id number]      Height:       66.0 in Accession #:    [id number]     Weight:       167.8 lb Date of Birth:  Oct 12, 1940     BSA:          1.856 m Patient Age:    38 years       BP:           130/80 mmHg Patient Gender: F              HR:           84 bpm. Exam Location:   Inpatient Procedure: 2D Echo Indications:    atrial fibrillation 427.31  History:        Patient has prior history of Echocardiogram examinations, most                 recent 10/05/2016. CAD, Arrythmias:Atrial Fibrillation and Atrial                 Flutter; Risk Factors:Diabetes and Hypertension.  Sonographer:    Jannett Celestine RDCS (AE) Referring Phys: [id number] [facility]  Sonographer Comments: restricted mobility due to lumbar injury. patient very sensitive to pain and unable to remain still at times as a result. off axis apical windows, unable to reposition IMPRESSIONS  1. Left ventricular ejection fraction, by estimation, is 60 to 65%. The left ventricle has normal function. The left ventricle has no regional wall motion abnormalities. There is mild concentric left ventricular hypertrophy. Left ventricular diastolic function could not be evaluated.  2. Right ventricular systolic function is normal. The right ventricular size is normal. There is normal pulmonary artery systolic pressure. The estimated right ventricular systolic pressure is 43.3 mmHg.  3. Left atrial size was moderately dilated.  4. Right atrial size was mildly dilated.  5. The mitral valve is normal in structure. Mild mitral valve regurgitation. No evidence of mitral stenosis. Moderate mitral annular calcification.  6. The aortic valve is normal in structure. There is mild calcification of the aortic valve. There is mild thickening of the aortic valve. Aortic valve regurgitation is mild. Mild to moderate aortic valve sclerosis/calcification is present, without any evidence of  aortic stenosis.  7. The inferior vena cava is normal in size with greater than 50% respiratory variability, suggesting right atrial pressure of 3 mmHg. FINDINGS  Left Ventricle: Left ventricular ejection fraction, by estimation, is 60 to 65%. The left ventricle has normal function. The left ventricle has no regional wall motion abnormalities. The left ventricular internal  cavity size was normal in size. There is  mild concentric left ventricular hypertrophy. Left ventricular diastolic function could not be evaluated due to atrial fibrillation. Left ventricular diastolic function could not be evaluated. Right Ventricle: The right ventricular size is normal. No increase in right ventricular wall thickness. Right ventricular systolic function is normal. There is normal pulmonary artery systolic pressure. The tricuspid regurgitant velocity is 2.97 m/s, and  with an assumed right atrial pressure of 3 mmHg, the estimated right ventricular systolic pressure is 70.3 mmHg. Left Atrium: Left atrial size was moderately dilated. Right Atrium: Right atrial size was mildly dilated. Pericardium: There is no evidence of pericardial effusion. Mitral Valve: The mitral valve is normal in structure. There is mild thickening of the mitral valve leaflet(s). There is mild calcification of the mitral valve leaflet(s). Moderate mitral annular calcification. Mild mitral valve regurgitation. No evidence of mitral valve stenosis. Tricuspid Valve: The tricuspid valve is normal in structure. Tricuspid valve regurgitation is mild . No evidence of tricuspid stenosis. Aortic Valve: The aortic valve is normal in structure. There is mild calcification of the aortic valve. There is mild thickening of the aortic valve. Aortic valve regurgitation is mild. Mild to moderate aortic valve sclerosis/calcification is present, without any evidence of aortic stenosis. Pulmonic Valve: The pulmonic valve was normal in structure. Pulmonic valve regurgitation is not visualized. No evidence of pulmonic stenosis. Aorta: The aortic root is normal in size and structure. Venous: The inferior vena cava is normal in size with greater than 50% respiratory variability, suggesting right atrial pressure of 3 mmHg. IAS/Shunts: No atrial level shunt detected by color flow Doppler.  LEFT VENTRICLE PLAX 2D LVIDd:         2.70 cm LVIDs:          2.07 cm LV PW:         1.10 cm LV IVS:        1.30 cm LVOT diam:     1.90 cm LV SV:         50 LV SV Index:   27 LVOT Area:     2.84 cm  LEFT ATRIUM             Index       RIGHT ATRIUM           Index LA diam:        4.60 cm 2.48 cm/m  RA Area:     11.90 cm LA Vol (A2C):   49.8 ml 26.83 ml/m RA Volume:   24.50 ml  13.20 ml/m LA Vol (A4C):   48.6 ml 26.19 ml/m LA Biplane Vol: 49.4 ml 26.62 ml/m  AORTIC VALVE LVOT Vmax:   90.70 cm/s LVOT Vmean:  68.400 cm/s LVOT VTI:    0.175 m  AORTA Ao Root diam: 2.80 cm MITRAL VALVE                TRICUSPID VALVE MV Area (PHT): 3.68 cm     TR Peak grad:   35.3 mmHg MV Decel Time: 206 msec     TR Vmax:        297.00 cm/s MV E velocity:  138.00 cm/s                             SHUNTS                             Systemic VTI:  0.18 m                             Systemic Diam: 1.90 cm Ena Dawley MD Electronically signed by Ena Dawley MD Signature Date/Time: 11/28/2019/4:26:01 PM    Final    VAS [place] LOWER EXTREMITY VENOUS (DVT) (ONLY [facility] & [facility])  Result Date: 12/21/2019  Lower Venous DVT Study Indications: Pain, Swelling, and Edema.  Risk Factors: Surgery Lumbar Spinal Fusion 11-22-2019. Performing Technologist: Griffin Basil RCT RDMS  Examination Guidelines: A complete evaluation includes B-mode imaging, spectral Doppler, color Doppler, and power Doppler as needed of all accessible portions of each vessel. Bilateral testing is considered an integral part of a complete examination. Limited examinations for reoccurring indications may be performed as noted. The reflux portion of the exam is performed with the patient in reverse Trendelenburg.  +---------+---------------+---------+-----------+----------+--------------+ RIGHT    CompressibilityPhasicitySpontaneityPropertiesThrombus Aging +---------+---------------+---------+-----------+----------+--------------+ CFV      Full           Yes      Yes                                  +---------+---------------+---------+-----------+----------+--------------+ SFJ      Full                                                        +---------+---------------+---------+-----------+----------+--------------+ FV Prox  Full                                                        +---------+---------------+---------+-----------+----------+--------------+ FV Mid   Full                                                        +---------+---------------+---------+-----------+----------+--------------+ FV DistalFull                                                        +---------+---------------+---------+-----------+----------+--------------+ PFV      Full                                                        +---------+---------------+---------+-----------+----------+--------------+ POP      Full  Yes      Yes                                 +---------+---------------+---------+-----------+----------+--------------+ PTV      Full                                                        +---------+---------------+---------+-----------+----------+--------------+ PERO     Full                                                        +---------+---------------+---------+-----------+----------+--------------+   +---------+---------------+---------+-----------+---------------+--------------+ LEFT     CompressibilityPhasicitySpontaneityProperties     Thrombus Aging +---------+---------------+---------+-----------+---------------+--------------+ CFV      Partial        Yes      Yes        softly         Acute                                                      echogenic                     +---------+---------------+---------+-----------+---------------+--------------+ SFJ      None                               softly         Acute                                                      echogenic                      +---------+---------------+---------+-----------+---------------+--------------+ FV Prox  Partial        Yes      Yes        softly         Acute                                                      echogenic                     +---------+---------------+---------+-----------+---------------+--------------+ FV Mid   None                               softly         Acute  echogenic                     +---------+---------------+---------+-----------+---------------+--------------+ FV DistalNone                               softly         Acute                                                      echogenic                     +---------+---------------+---------+-----------+---------------+--------------+ PFV      None                               softly         Acute                                                      echogenic                     +---------+---------------+---------+-----------+---------------+--------------+ POP      None                               softly         Acute                                                      echogenic                     +---------+---------------+---------+-----------+---------------+--------------+ PTV      None                               softly         Acute                                                      echogenic                     +---------+---------------+---------+-----------+---------------+--------------+ PERO     None                               softly         Acute  echogenic                     +---------+---------------+---------+-----------+---------------+--------------+    Summary: RIGHT: - There is no evidence of deep vein thrombosis in the lower extremity.  - No cystic structure found in the popliteal fossa.  LEFT: - Findings  consistent with acute deep vein thrombosis involving the left common femoral vein, SF junction, left femoral vein, left proximal profunda vein, left popliteal vein, left posterior tibial veins, and left peroneal veins. - No cystic structure found in the popliteal fossa.  *See table(s) above for measurements and observations. Electronically signed by Curt Jews MD on 11/25/2019 at 3:07:17 PM.    Final    CT ANGIO HEAD CODE STROKE  Result Date: 11/28/2019 CLINICAL DATA:  Subarachnoid hemorrhage, follow-up EXAM: CT ANGIOGRAPHY HEAD TECHNIQUE: Multidetector CT imaging of the head was performed using the standard protocol during bolus administration of intravenous contrast. Multiplanar CT image reconstructions and MIPs were obtained to evaluate the vascular anatomy. CONTRAST:  48m OMNIPAQUE IOHEXOL 350 MG/ML SOLN COMPARISON:  None. FINDINGS: CTA HEAD Motion artifact is present. Anterior circulation: Intracranial internal carotid arteries are patent with atherosclerotic irregularity and calcified plaque. Middle and anterior cerebral arteries are patent. Posterior circulation: Intracranial vertebral, basilar, and posterior cerebral arteries are patent. There is atherosclerotic irregularity of the vertebral arteries with mixed plaque causing mild stenosis. Venous sinuses: Patent as permitted by contrast timing. Anatomic variants: None significant. Review of the MIP images confirms the above findings IMPRESSION: No aneurysm identified. Remains highly suspicious for ruptured aneurysm and catheter angiography is recommended. Electronically Signed   By: PMacy MisM.D.   On: 11/28/2019 13:37    Review of systems not obtained due to patient factors. Blood pressure 134/81, pulse 87, temperature 98 F (36.7 C), temperature source Axillary, resp. rate (!) 22, weight 76.1 kg, SpO2 97 %. Patient is somnolent but she will awaken to voice.  She states her name and knows that she is in the hospital.  She is not oriented to  place or her situation.  Her pupils are equal round and reactive at 4 mm.  Her gaze is conjugate.  Extraocular movements appear full.  Facial movement is symmetric bilaterally.  She has equal strength in both upper and lower extremities.  Examination head ears eyes nose throat demonstrates no evidence of obvious trauma or injury.  Assessment/Plan: Patient with significant subarachnoid hemorrhage without evidence of aneurysm on CT angiogram.  The CT angiogram was of good quality and I think the likelihood of a significant vascular risk lesion is quite low.  Given her other medical issues I do not think that a catheter angiogram is needed at this point.  Right now I recommend supportive management with IV hydration.  Can add Nemo top for some marginal benefit with regard to lessening the risk of vasospasm.  Patient may be mobilized and fed from my standpoint.  Recommend follow-up head CT scan in morning.  [facility]Cooper [facility]Pool 11/28/2019, 6:24 PM

## 2019-11-28 NOTE — Progress Notes (Signed)
PT Cancellation Note/Discharge  Patient Details Name: Nicole [name] MRN: [id number] DOB: 12-28-1940   Cancelled Treatment:    Reason Eval/Treat Not Completed: Medical issues which prohibited therapy. Pt with new SAH and will be transferred to ICU. Will sign off. Please reorder when appropriate.    Shary Decamp [facility] 11/28/2019, 3:17 PM [place] Pager [phone] Office [phone]

## 2019-11-28 NOTE — Progress Notes (Signed)
  PHARMACY - PHYSICIAN COMMUNICATION CRITICAL VALUE ALERT - BLOOD CULTURE IDENTIFICATION (BCID)  Nicole [name] is an 79 y.o. female who presented to [facility] on 12/24/2019 with a chief complaint of altered mental status.  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    Name of physician (or Provider) Contacted: Antonieta Pert  Current antibiotics: cefepime, azithromycin   Changes to prescribed antibiotics recommended:  No changes needed    Results for orders placed or performed during the hospital encounter of 11/25/2019  Blood Culture ID Panel (Reflexed) (Collected: 11/26/2019  9:40 AM)  Result Value Ref Range   Enterococcus faecalis NOT DETECTED NOT DETECTED   Enterococcus Faecium NOT DETECTED NOT DETECTED   Listeria monocytogenes NOT DETECTED NOT DETECTED   Staphylococcus species DETECTED (A) NOT DETECTED   Staphylococcus aureus (BCID) NOT DETECTED NOT DETECTED   Staphylococcus epidermidis DETECTED (A) NOT DETECTED   Staphylococcus lugdunensis NOT DETECTED NOT DETECTED   Streptococcus species NOT DETECTED NOT DETECTED   Streptococcus agalactiae NOT DETECTED NOT DETECTED   Streptococcus pneumoniae NOT DETECTED NOT DETECTED   Streptococcus pyogenes NOT DETECTED NOT DETECTED   A.calcoaceticus-baumannii NOT DETECTED NOT DETECTED   Bacteroides fragilis NOT DETECTED NOT DETECTED   Enterobacterales NOT DETECTED NOT DETECTED   Enterobacter cloacae complex NOT DETECTED NOT DETECTED   Escherichia coli NOT DETECTED NOT DETECTED   Klebsiella aerogenes NOT DETECTED NOT DETECTED   Klebsiella oxytoca NOT DETECTED NOT DETECTED   Klebsiella pneumoniae NOT DETECTED NOT DETECTED   Proteus species NOT DETECTED NOT DETECTED   Salmonella species NOT DETECTED NOT DETECTED   Serratia marcescens NOT DETECTED NOT DETECTED   Haemophilus influenzae NOT DETECTED NOT DETECTED   Neisseria meningitidis NOT DETECTED NOT DETECTED    Pseudomonas aeruginosa NOT DETECTED NOT DETECTED   Stenotrophomonas maltophilia NOT DETECTED NOT DETECTED   Candida albicans NOT DETECTED NOT DETECTED   Candida auris NOT DETECTED NOT DETECTED   Candida glabrata NOT DETECTED NOT DETECTED   Candida krusei NOT DETECTED NOT DETECTED   Candida parapsilosis NOT DETECTED NOT DETECTED   Candida tropicalis NOT DETECTED NOT DETECTED   Cryptococcus neoformans/gattii NOT DETECTED NOT DETECTED   Methicillin resistance mecA/C DETECTED (A) NOT [place]  PGY1 Pharmacy Resident  11/28/2019  8:05 AM

## 2019-11-28 NOTE — Progress Notes (Signed)
[place] Progress Note Patient Name: Nicole [name] DOB: 09-01-1940 MRN: [id number]   Date of Service  11/28/2019  HPI/Events of Note  Pain - Related to recent lumbar Kyphoplasty. Patient has passed swallow study per Nursing.   eICU Interventions  Plan: 1. Dilaudid 0.5 mg IV Q 4 hours PRN severe pain.  2. NPO except sips with meds.         Alydia Gosser Cornelia Copa 11/28/2019, 8:26 PM

## 2019-11-28 NOTE — Progress Notes (Signed)
  Echocardiogram 2D Echocardiogram has been performed.  Nicole [name] 11/28/2019, 4:21 PM

## 2019-11-28 NOTE — Progress Notes (Signed)
PT Cancellation Note  Patient Details Name: Nicole [name] MRN: [id number] DOB: 08/06/1940   Cancelled Treatment:    Reason Eval/Treat Not Completed: Patient at procedure or test/unavailable. Pt being taken down for test.   Shary Decamp [facility] 11/28/2019, 12:37 PM

## 2019-11-28 NOTE — Progress Notes (Signed)
Pt barely passed [place] swallow. But may need to consider CORTRAK Friday if she is not consistently awake. Will have SLP follow up for swallow evaluation.

## 2019-11-28 NOTE — Significant Event (Addendum)
Rapid Response Event Note   Reason for Call :  Called by bed placement for notification of ICU transfer order.  Pt admitted yesterday for AMS. At that time a sepsis work-up was initiated. Today, a CT head was completed for continued AMS. Arrived to bedside and informed by Dr. Loanne Drilling that a [place] was noted on pt's CT and that she needed a STAT CTA completed.  Initial Focused Assessment:  Pt alert, disoriented. PERRLA, 49mm. Pt head facing to the left, but pt also moaning with back pain. When asked she will not turn her head to the right, but will track with her eyes all the way to the right. EOMI. She is aphasic and dysarthric. Intermittently she will speak one-word response. She has complete hemianopia to her right visual field. She has some effort to gravity in bilateral upper extremities and no effort to gravity in bilateral lower extremities. Her left calf is more swollen than her right. She has a known DVT to her left leg which she has been on Eliquis for.   VS: T 97.83F, BP 120/80, HR 93, RR 17, SpO2 98% on room air CBG 186 at 1103  Interventions:  Oversaw the completion of the following orders: -STAT CTA completed -Labs: APTT, BMP, PT-INR -Kcentra given as ordered  Family at bedside for duration of event and updated.   Plan of Care:  -Transfer to Neuro ICU  Event Summary:  MD Notified: Dr. Loanne Drilling at bedside upon my arrival Call Time: [place] Time: 1250 End Time: [place], RN

## 2019-11-28 NOTE — Progress Notes (Signed)
PROGRESS NOTE    Nicole [name]  EXB:[id number] DOB: Jul 09, 1940 DOA: 12/14/2019 PCP: [name], MD   Chief Complaint  Patient presents with  . Code Sepsis  . Atrial Fibrillation   Brief Narrative: 79 year old female with history of recently diagnosed DVT on Eliquis, chronic lumbar vertebral compression fracture with recent kyphoplasty, chronic A. fib, hypertension, remote lung cancer, insulin-dependent diabetes, HLD presented with altered mental status.  Per reprot-Patient was diagnosed with DVT about 2 weeks ago, and son reported that patient has not been compliant with Eliquis supposedly for her A. fib, and about 2 weeks ago was found to have acute DVT. Eliquis was restarted at nursing home and on Friday patient went for lumbar vertebral kyphoplasty. After surgery, patient has had heartburn to cope with the pain meds which is tramadol alternate with Vicodin.  Patient lost her appetite and became very nauseous over the weekend and has had frequent vomiting on weekend, has poor oral intake since then.  Patient denied any abdominal pain, no diarrhea no dysuria.  No fever or chills.  Today patient was found to have a fever 102, and hypoxia on 4 L oxygen, nursing home somewhat suspect patient has UTI and send urine sample.  When EMS arrived it was found patient was in rapid A. fib with heart rate 176, patient was given 10 mg Cardizem IV push and 500 normal saline. ED Course: Blood pressure maintained, heart rate in rapid A. Fib.  UA showed no significant UTI, blood work WBC 13.7, lactic acid 2.7>2.2.  Check x-ray suspect  Right lung congestion.  DVT study positive for left leg appears to be acute.  Subjective:  Son at bedside he thinks she is more coherant  But still confused. She is alert awake could tell me her name and that she is in the hospital was able to move both her lower extremities and moving upper extremities very well, neuro exam is limited due to confusion. Son says she has  been confused since frday and they were attributing to pain meds initially. on RA on cardizem gtt LLE swollen for few wks from dvt No pain complaints,seems to have some back pain? Febrile T-max 100.2 overnight, doing well on room air blood pressure stable. WBC 12.6 from 13.7.  Called by radiology that patient had SAH in CT head-I immediately called ICU team who came at bedside to transfer the patient to ICU. Also consulted neurosurgery and discussed w/ Dr Hilma Favors CTA, reversal for life-threatening bleeding and transferred to ICU and he will see the patient.  Assessment & Plan:  NUU:VOZDG in CT brain done for her mental status: Patient transferring to ICU, anticoagulation reversal, CTA head, ICU has evaluated the patient at the bedside-activated code stroke and transferred to ICU, .  Sepsis with fever leukocytosis tachycardia tachypnea POA pneumonia versus UTI as a source, CT chest done in the ED-showing constellation of findings likely reflecting pulmonary edema versus atypical infection, new small to moderate left pleural effusion, new left and mediastinal supraclavicular lymphadenopathy nonspecific and possibly reactive and need follow-up, new 7 mm right apical nodular versus consolidative opacity,needs CT chest noncontrast at 6 to 12 months., patient was placed on IV antibiotics IV fluids.Given abnormal finding on the CT chest getting BNP, echocardiogram.  Keep on empiric antibiotics for now.  Acute encephalopathy- from [facility], possible sepsis.  Transferring to ICU  A. fib chronic with RVR placed on Cardizem drip in the ED. patient on Eliquis at home/on admission received heparin bolus.  Discontinued anticoagulation  due to [facility]  Recent DVT- Eliquis discontinued due to [facility].  Doppler 11/3 shows acute DVT on the left LE.  Unfortunately will not be a candidate for anticoagulation can consider IVC.  AKI/nongap metabolic acidosis- resolving w/ IV fluids, Lasix and nephrotoxic meds on hold.   Creatinine has improved today. Recent Labs  Lab 11/22/19 0849 12/17/2019 0954 11/28/19 0137  BUN 23 30* 31*  CREATININE 0.94 1.63* 1.09*   Hx of lung cancer- had chemo few yrs and was told she is in remission per son, he reorpts patient follows at [facility] supposed to have CT chest.  Hypertension holding diuresis.  Insulin-dependent diabetes mellitus with uncontrolled hyperglycemia hold Metformin due to AKI.  A1c stable 6.4, continue sliding scale Recent Labs  Lab 11/22/19 0841 11/22/19 1529 12/19/2019 1851 11/28/19 0004  GLUCAP 131* 114* 245* 208*   Increased conspicuity of right thyroid nodule in comparison to prior- nonemergent evaluation with thyroid ultrasound as some point.  Staph epidermidis bacteremia likely contamination follow-up blood culture pharmacy monitoring  Nutrition: Diet Order            Diet heart healthy/carb modified Room service appropriate? Yes; Fluid consistency: Thin  Diet effective now                Body mass index is 27.08 kg/m.  Pressure Ulcer: Pressure Injury 12/20/2019 Buttocks Right;Left Stage 1 -  Intact skin with non-blanchable redness of a localized area usually over a bony prominence. (Active)  12/03/2019 2020  Location: Buttocks  Location Orientation: Right;Left  Staging: Stage 1 -  Intact skin with non-blanchable redness of a localized area usually over a bony prominence.  Wound Description (Comments):   Present on Admission: Yes    DVT prophylaxis: SCD Code Status:   Code Status: DNR  Family Communication: plan of care discussed with patient at bedside.  Status is: Inpatient  Remains inpatient appropriate because:IV treatments appropriate due to intensity of illness or inability to take PO and Inpatient level of care appropriate due to severity of illness  Dispo: The patient is from: SNF              Anticipated d/c is to: SNF              Anticipated d/c date is: > 3 days              Patient currently is not medically  stable to d/c.  Consultants:see note  Procedures:see note  Culture/Microbiology    Component Value Date/Time   SDES BLOOD RIGHT WRIST 12/05/2019 0940   SPECREQUEST  12/11/2019 0940    BOTTLES DRAWN AEROBIC AND ANAEROBIC Blood Culture results may not be optimal due to an inadequate volume of blood received in culture bottles   CULT GRAM POSITIVE COCCI 11/26/2019 0940   REPTSTATUS PENDING 11/28/2019 0940    Other culture-see note  Medications: Scheduled Meds: . acidophilus  1 capsule Oral Daily  . apixaban  5 mg Oral BID  . docusate sodium  100 mg Oral Daily  . insulin aspart  0-5 Units Subcutaneous QHS  . insulin aspart  0-9 Units Subcutaneous TID WC  . ipratropium  0.5 mg Nebulization Q6H  . lidocaine  1 patch Transdermal Daily  . methocarbamol  500 mg Oral Daily  . metoprolol tartrate  50 mg Oral BID  . multivitamin with minerals  1 tablet Oral Daily  . rosuvastatin  5 mg Oral Daily  . vitamin B-12  1,000 mcg Oral Daily  .  witch hazel-glycerin  1 application Topical TID   Continuous Infusions: . azithromycin 500 mg (11/28/19 0546)  . ceFEPime (MAXIPIME) IV 2 g (12/19/2019 2053)  . diltiazem (CARDIZEM) infusion 15 mg/hr (11/28/19 0317)    Antimicrobials: Anti-infectives (From admission, onward)   Start     Dose/Rate Route Frequency Ordered Stop   11/28/19 0445  azithromycin (ZITHROMAX) 500 mg in sodium chloride 0.9 % 250 mL IVPB        500 mg 250 mL/hr over 60 Minutes Intravenous Every 24 hours 11/28/19 0359     12/22/2019 2200  ceFEPIme (MAXIPIME) 2 g in sodium chloride 0.9 % 100 mL IVPB        2 g 200 mL/hr over 30 Minutes Intravenous Every 12 hours 11/28/2019 1004     12/06/2019 1000  ceFEPIme (MAXIPIME) 2 g in sodium chloride 0.9 % 100 mL IVPB        2 g 200 mL/hr over 30 Minutes Intravenous  Once 12/24/2019 0958 12/23/2019 1057     Objective: Vitals: Today's Vitals   11/28/19 0304 11/28/19 0338 11/28/19 0733 11/28/19 0858  BP:  112/82 129/78   Pulse:  90 100   Resp:   13 20   Temp:  97.7 F (36.5 C) 98.1 F (36.7 C)   TempSrc:  Axillary Axillary   SpO2: 99% 100% 98% 98%  Weight:      PainSc:        Intake/Output Summary (Last 24 hours) at 11/28/2019 0907 Last data filed at 11/28/2019 0546 Gross per 24 hour  Intake 3098.37 ml  Output 2000 ml  Net 1098.37 ml   Filed Weights   12/15/2019 2020  Weight: 76.1 kg   Weight change:   Intake/Output from previous day: 11/03 0701 - 11/04 0700 In: 3098.4 [I.V.:1793.4; IV Piggyback:1305] Out: 2000 [Urine:2000] Intake/Output this shift: No intake/output data recorded.  Examination: General exam: AAO x1,NAD, weak appearing. HEENT:Oral mucosa moist, Ear/Nose WNL grossly,dentition normal. Respiratory system: bilaterally clear,no wheezing or crackles,no use of accessory muscle, non tender. Cardiovascular system: S1 & S2 +, regular, No JVD. Gastrointestinal system: Abdomen soft, NT,ND, BS+. Nervous System:Alert, awake, moving upper extremities well, able to move her lower extremities.  Neuro exam limited by her confusion  Extremities: No edema, distal peripheral pulses palpable.  Skin: No rashes,no icterus. MSK: Normal muscle bulk,tone, power  Data Reviewed: I have personally reviewed following labs and imaging studies CBC: Recent Labs  Lab 11/22/19 0849 12/01/2019 0954 11/28/19 0137  WBC 9.5 13.7* 12.6*  NEUTROABS  --  11.6*  --   HGB 13.4 12.2 12.1  HCT 41.2 39.1 36.6  MCV 93.2 96.5 93.4  PLT 251 300 638   Basic Metabolic Panel: Recent Labs  Lab 11/22/19 0849 12/03/2019 0954 11/28/19 0137  NA 138 134* 134*  K 3.0* 4.5 4.1  CL 96* 98 99  CO2 31 16* 24  GLUCOSE 130* 261* 207*  BUN 23 30* 31*  CREATININE 0.94 1.63* 1.09*  CALCIUM 9.7 8.9 9.3   GFR: Estimated Creatinine Clearance: 45 mL/min (A) (by C-G formula based on SCr of 1.09 mg/dL (H)). Liver Function Tests: Recent Labs  Lab 12/07/2019 0954 11/28/19 0137  AST 21 18  ALT 12 13  ALKPHOS 63 65  BILITOT 1.6* 0.7  PROT 5.9* 6.2*   ALBUMIN 2.6* 2.7*   No results for input(s): LIPASE, AMYLASE in the last 168 hours. No results for input(s): AMMONIA in the last 168 hours. Coagulation Profile: Recent Labs  Lab 11/22/19 0849 12/15/2019 [id number]  INR 1.0 1.3*   Cardiac Enzymes: Recent Labs  Lab 12/24/2019 2024  CKTOTAL 74   BNP (last 3 results) No results for input(s): PROBNP in the last 8760 hours. HbA1C: Recent Labs    12/03/2019 2024  HGBA1C 6.4*   CBG: Recent Labs  Lab 11/22/19 0841 11/22/19 1529 11/28/2019 1851 11/28/19 0004  GLUCAP 131* 114* 245* 208*   Lipid Profile: No results for input(s): CHOL, HDL, LDLCALC, TRIG, CHOLHDL, LDLDIRECT in the last 72 hours. Thyroid Function Tests: No results for input(s): TSH, T4TOTAL, FREET4, T3FREE, THYROIDAB in the last 72 hours. Anemia Panel: No results for input(s): VITAMINB12, FOLATE, FERRITIN, TIBC, IRON, RETICCTPCT in the last 72 hours. Sepsis Labs: Recent Labs  Lab 11/30/2019 0954 12/13/2019 1353  LATICACIDVEN 2.7* 2.2*    Recent Results (from the past 240 hour(s))  Blood Culture (routine x 2)     Status: None (Preliminary result)   Collection Time: 12/01/2019  9:35 AM   Specimen: BLOOD  Result Value Ref Range Status   Specimen Description BLOOD LEFT ANTECUBITAL  Final   Special Requests   Final    BOTTLES DRAWN AEROBIC AND ANAEROBIC Blood Culture results may not be optimal due to an inadequate volume of blood received in culture bottles   Culture   Final    NO GROWTH < 24 HOURS Performed at [facility], [place] [address], [place], [place] [address]    Report Status PENDING  Incomplete  Blood Culture (routine x 2)     Status: None (Preliminary result)   Collection Time: 12/16/2019  9:40 AM   Specimen: BLOOD RIGHT WRIST  Result Value Ref Range Status   Specimen Description BLOOD RIGHT WRIST  Final   Special Requests   Final    BOTTLES DRAWN AEROBIC AND ANAEROBIC Blood Culture results may not be optimal due to an inadequate volume of blood received  in culture bottles   Culture  Setup Time   Final    GRAM POSITIVE COCCI IN CLUSTERS IN BOTH AEROBIC AND ANAEROBIC BOTTLES CRITICAL RESULT CALLED TO, READ BACK BY AND VERIFIED WITH: PHARMD A [place] [id number] AT 737 AM BY CM Performed at [facility], [place] [address], [place], [place] [address]    Culture GRAM POSITIVE COCCI  Final   Report Status PENDING  Incomplete  Blood Culture ID Panel (Reflexed)     Status: Abnormal   Collection Time: 12/13/2019  9:40 AM  Result Value Ref Range Status   Enterococcus faecalis NOT DETECTED NOT DETECTED Final   Enterococcus Faecium NOT DETECTED NOT DETECTED Final   Listeria monocytogenes NOT DETECTED NOT DETECTED Final   Staphylococcus species DETECTED (A) NOT DETECTED Final   Staphylococcus aureus (BCID) NOT DETECTED NOT DETECTED Final   Staphylococcus epidermidis DETECTED (A) NOT DETECTED Final    Comment: Methicillin (oxacillin) resistant coagulase negative staphylococcus. Possible blood culture contaminant (unless isolated from more than one blood culture draw or clinical case suggests pathogenicity). No antibiotic treatment is indicated for blood  culture contaminants. CRITICAL RESULT CALLED TO, READ BACK BY AND VERIFIED WITH: PHARMD A SULLIVAN [id number] AT 737 AM BY CM    Staphylococcus lugdunensis NOT DETECTED NOT DETECTED Final   Streptococcus species NOT DETECTED NOT DETECTED Final   Streptococcus agalactiae NOT DETECTED NOT DETECTED Final   Streptococcus pneumoniae NOT DETECTED NOT DETECTED Final   Streptococcus pyogenes NOT DETECTED NOT DETECTED Final   A.calcoaceticus-baumannii NOT DETECTED NOT DETECTED Final   Bacteroides fragilis NOT DETECTED NOT DETECTED Final  Enterobacterales NOT DETECTED NOT DETECTED Final   Enterobacter cloacae complex NOT DETECTED NOT DETECTED Final   Escherichia coli NOT DETECTED NOT DETECTED Final   Klebsiella aerogenes NOT DETECTED NOT DETECTED Final   Klebsiella oxytoca NOT DETECTED NOT DETECTED Final    Klebsiella pneumoniae NOT DETECTED NOT DETECTED Final   Proteus species NOT DETECTED NOT DETECTED Final   Salmonella species NOT DETECTED NOT DETECTED Final   Serratia marcescens NOT DETECTED NOT DETECTED Final   Haemophilus influenzae NOT DETECTED NOT DETECTED Final   Neisseria meningitidis NOT DETECTED NOT DETECTED Final   Pseudomonas aeruginosa NOT DETECTED NOT DETECTED Final   Stenotrophomonas maltophilia NOT DETECTED NOT DETECTED Final   Candida albicans NOT DETECTED NOT DETECTED Final   Candida auris NOT DETECTED NOT DETECTED Final   Candida glabrata NOT DETECTED NOT DETECTED Final   Candida krusei NOT DETECTED NOT DETECTED Final   Candida parapsilosis NOT DETECTED NOT DETECTED Final   Candida tropicalis NOT DETECTED NOT DETECTED Final   Cryptococcus neoformans/gattii NOT DETECTED NOT DETECTED Final   Methicillin resistance mecA/C DETECTED (A) NOT DETECTED Final    Comment: CRITICAL RESULT CALLED TO, READ BACK BY AND VERIFIED WITH: PHARMD A SULLIVAN [id number] AT 737 AM BY CM Performed at [facility], [address] [place], [place] [address]   Respiratory Panel by RT PCR (Flu A&B, Covid) - Nasopharyngeal Swab     Status: None   Collection Time: 12/12/2019  2:03 PM   Specimen: Nasopharyngeal Swab  Result Value Ref Range Status   SARS Coronavirus 2 by RT PCR NEGATIVE NEGATIVE Final    Comment: (NOTE) SARS-CoV-2 target nucleic acids are NOT DETECTED.  The SARS-CoV-2 RNA is generally detectable in upper respiratoy specimens during the acute phase of infection. The lowest concentration of SARS-CoV-2 viral copies this assay can detect is 131 copies/mL. A negative result does not preclude SARS-Cov-2 infection and should not be used as the sole basis for treatment or other patient management decisions. A negative result may occur with  improper specimen collection/handling, submission of specimen other than nasopharyngeal swab, presence of viral mutation(s) within the areas  targeted by this assay, and inadequate number of viral copies (<131 copies/mL). A negative result must be combined with clinical observations, patient history, and epidemiological information. The expected result is Negative.  Fact Sheet for Patients:  PinkCheek.be  Fact Sheet for Healthcare Providers:  GravelBags.it  This test is no t yet approved or cleared by the [place] FDA and  has been authorized for detection and/or diagnosis of SARS-CoV-2 by FDA under an Emergency Use Authorization (EUA). This EUA will remain  in effect (meaning this test can be used) for the duration of the COVID-19 declaration under Section 564(b)(1) of the Act, 21 U.S.C. section 360bbb-3(b)(1), unless the authorization is terminated or revoked sooner.     Influenza A by PCR NEGATIVE NEGATIVE Final   Influenza B by PCR NEGATIVE NEGATIVE Final    Comment: (NOTE) The Xpert Xpress SARS-CoV-2/FLU/RSV assay is intended as an aid in  the diagnosis of influenza from Nasopharyngeal swab specimens and  should not be used as a sole basis for treatment. Nasal washings and  aspirates are unacceptable for Xpert Xpress SARS-CoV-2/FLU/RSV  testing.  Fact Sheet for Patients: PinkCheek.be  Fact Sheet for Healthcare Providers: GravelBags.it  This test is not yet approved or cleared by the [place] FDA and  has been authorized for detection and/or diagnosis of SARS-CoV-2 by  FDA under an Emergency Use Authorization (  EUA). This EUA will remain  in effect (meaning this test can be used) for the duration of the  Covid-19 declaration under Section 564(b)(1) of the Act, 21  U.S.C. section 360bbb-3(b)(1), unless the authorization is  terminated or revoked. Performed at [facility], [place] [address], [place], [place] [address]      Radiology Studies: CT CHEST WO CONTRAST  Result Date:  12/15/2019 CLINICAL DATA:  Pneumonia EXAM: CT CHEST WITHOUT CONTRAST TECHNIQUE: Multidetector CT imaging of the chest was performed following the standard protocol without IV contrast. COMPARISON:  July 18, 2019, May 07, 2018 FINDINGS: Cardiovascular: Enlarged cardiomediastinal silhouette. Three-vessel coronary artery atherosclerotic calcifications. Atherosclerotic calcifications of the aorta. No pericardial effusion. Bovine aortic arch. Mediastinum/Nodes: LEFT supraclavicular lymph node is increased in size in comparison to prior and measures 9 mm in the short axis, previously 5 mm (series 3, image 19). There are multiple adjacent prominent LEFT supraclavicular lymph nodes which have increased in size in comparison since 2020. Aortopulmonic lymph node measures 9 mm in the short axis, new since prior (series 3, image 56). Multiple thyroid nodules, the largest of which measures approximately 15 mm. Lungs/Pleura: Small to moderate LEFT pleural effusion. Evaluation of the parenchyma is limited secondary to respiratory motion. New RIGHT apical nodular versus consolidative opacity measuring 7 mm (series 3, image 19). Interlobular septal thickening with perivascular and peripheral predominant ground-glass opacities. Additional consolidative opacity within the RIGHT upper lobe is new since prior. Bibasilar atelectasis. Consolidative opacity along the RIGHT paramediastinal inferior border with associated bronchiectasis is unchanged. Upper Abdomen: Respiratory motion limits evaluation. Status post cholecystectomy. Small hiatal hernia. Musculoskeletal: No acute osseous abnormality. Degenerative changes of the thoracic spine. IMPRESSION: 1. Constellation of findings likely reflect pulmonary edema versus atypical infection. 2. New small to moderate LEFT pleural effusion. 3. New LEFT and mediastinal supraclavicular lymphadenopathy, nonspecific and possibly reactive in etiology. Recommend attention on follow-up. 4. New 7 mm  RIGHT apical nodular versus consolidative opacity. Non-contrast chest CT at 6-12 months is recommended. If the nodule is stable at time of repeat CT, then future CT at 18-24 months (from today's scan) is considered optional for low-risk patients, but is recommended for high-risk patients. This recommendation follows the consensus statement: Guidelines for Management of Incidental Pulmonary Nodules Detected on CT Images: From the Fleischner Society 2017; Radiology 2017; 284:228-243. 5. Increased conspicuity of a RIGHT thyroid nodule in comparison to priors. Recommend nonemergent evaluation with dedicated thyroid ultrasound. Aortic Atherosclerosis (ICD10-I70.0). Electronically Signed   By: Valentino Saxon MD   On: 12/04/2019 16:17   DG Chest Port 1 View  Result Date: 12/05/2019 CLINICAL DATA:  Questionable sepsis - evaluate for abnormality EXAM: PORTABLE CHEST 1 VIEW COMPARISON:  07/29/2018 chest radiograph and prior. 07/18/2019 CT chest. FINDINGS: No pneumothorax, focal consolidation or pleural effusion. Cardiomediastinal silhouette within normal limits. Prominence of the perihilar vessels secondary to hypoinflation. Aortic atherosclerotic calcifications. No acute osseous abnormality. IMPRESSION: No focal airspace disease.  Hypoinflated lungs. Electronically Signed   By: Primitivo Gauze M.D.   On: 12/11/2019 10:26   VAS [place] LOWER EXTREMITY VENOUS (DVT) (ONLY [facility] & [facility])  Result Date: 12/04/2019  Lower Venous DVT Study Indications: Pain, Swelling, and Edema.  Risk Factors: Surgery Lumbar Spinal Fusion 11-22-2019. Performing Technologist: Griffin Basil RCT RDMS  Examination Guidelines: A complete evaluation includes B-mode imaging, spectral Doppler, color Doppler, and power Doppler as needed of all accessible portions of each vessel. Bilateral testing is considered an integral part of a complete examination. Limited examinations  for reoccurring indications may be performed as noted. The reflux portion of  the exam is performed with the patient in reverse Trendelenburg.  +---------+---------------+---------+-----------+----------+--------------+ RIGHT    CompressibilityPhasicitySpontaneityPropertiesThrombus Aging +---------+---------------+---------+-----------+----------+--------------+ CFV      Full           Yes      Yes                                 +---------+---------------+---------+-----------+----------+--------------+ SFJ      Full                                                        +---------+---------------+---------+-----------+----------+--------------+ FV Prox  Full                                                        +---------+---------------+---------+-----------+----------+--------------+ FV Mid   Full                                                        +---------+---------------+---------+-----------+----------+--------------+ FV DistalFull                                                        +---------+---------------+---------+-----------+----------+--------------+ PFV      Full                                                        +---------+---------------+---------+-----------+----------+--------------+ POP      Full           Yes      Yes                                 +---------+---------------+---------+-----------+----------+--------------+ PTV      Full                                                        +---------+---------------+---------+-----------+----------+--------------+ PERO     Full                                                        +---------+---------------+---------+-----------+----------+--------------+   +---------+---------------+---------+-----------+---------------+--------------+ LEFT     CompressibilityPhasicitySpontaneityProperties     Thrombus Aging +---------+---------------+---------+-----------+---------------+--------------+ CFV      Partial  Yes      Yes         softly         Acute                                                      echogenic                     +---------+---------------+---------+-----------+---------------+--------------+ SFJ      None                               softly         Acute                                                      echogenic                     +---------+---------------+---------+-----------+---------------+--------------+ FV Prox  Partial        Yes      Yes        softly         Acute                                                      echogenic                     +---------+---------------+---------+-----------+---------------+--------------+ FV Mid   None                               softly         Acute                                                      echogenic                     +---------+---------------+---------+-----------+---------------+--------------+ FV DistalNone                               softly         Acute                                                      echogenic                     +---------+---------------+---------+-----------+---------------+--------------+ PFV      None  softly         Acute                                                      echogenic                     +---------+---------------+---------+-----------+---------------+--------------+ POP      None                               softly         Acute                                                      echogenic                     +---------+---------------+---------+-----------+---------------+--------------+ PTV      None                               softly         Acute                                                      echogenic                     +---------+---------------+---------+-----------+---------------+--------------+ PERO     None                                softly         Acute                                                      echogenic                     +---------+---------------+---------+-----------+---------------+--------------+    Summary: RIGHT: - There is no evidence of deep vein thrombosis in the lower extremity.  - No cystic structure found in the popliteal fossa.  LEFT: - Findings consistent with acute deep vein thrombosis involving the left common femoral vein, SF junction, left femoral vein, left proximal profunda vein, left popliteal vein, left posterior tibial veins, and left peroneal veins. - No cystic structure found in the popliteal fossa.  *See table(s) above for measurements and observations. Electronically signed by Curt Jews MD on 12/15/2019 at 3:07:17 PM.    Final      LOS: 1 day   Antonieta Pert, MD [facility]  11/28/2019, 9:07 AM

## 2019-11-29 ENCOUNTER — Encounter (HOSPITAL_COMMUNITY): Admission: EM | Disposition: E | Payer: Self-pay | Source: Home / Self Care | Attending: Critical Care Medicine

## 2019-11-29 ENCOUNTER — Inpatient Hospital Stay (HOSPITAL_COMMUNITY): Payer: Medicare Other

## 2019-11-29 ENCOUNTER — Other Ambulatory Visit: Payer: Self-pay

## 2019-11-29 ENCOUNTER — Inpatient Hospital Stay (HOSPITAL_COMMUNITY): Payer: Medicare Other | Admitting: Certified Registered Nurse Anesthetist

## 2019-11-29 ENCOUNTER — Encounter (HOSPITAL_COMMUNITY): Payer: Self-pay | Admitting: Internal Medicine

## 2019-11-29 DIAGNOSIS — N951 Menopausal and female climacteric states: Secondary | ICD-10-CM | POA: Insufficient documentation

## 2019-11-29 DIAGNOSIS — I83893 Varicose veins of bilateral lower extremities with other complications: Secondary | ICD-10-CM

## 2019-11-29 DIAGNOSIS — R4182 Altered mental status, unspecified: Secondary | ICD-10-CM | POA: Diagnosis not present

## 2019-11-29 DIAGNOSIS — I609 Nontraumatic subarachnoid hemorrhage, unspecified: Secondary | ICD-10-CM

## 2019-11-29 DIAGNOSIS — R2242 Localized swelling, mass and lump, left lower limb: Secondary | ICD-10-CM

## 2019-11-29 DIAGNOSIS — I4891 Unspecified atrial fibrillation: Secondary | ICD-10-CM

## 2019-11-29 HISTORY — PX: IR IVC FILTER PLMT / S&I /IMG GUID/MOD SED: IMG701

## 2019-11-29 LAB — CBC
HCT: 37.3 % (ref 36.0–46.0)
Hemoglobin: 12.1 g/dL (ref 12.0–15.0)
MCH: 30.7 pg (ref 26.0–34.0)
MCHC: 32.4 g/dL (ref 30.0–36.0)
MCV: 94.7 fL (ref 80.0–100.0)
Platelets: 290 10*3/uL (ref 150–400)
RBC: 3.94 MIL/uL (ref 3.87–5.11)
RDW: 14.8 % (ref 11.5–15.5)
WBC: 10.1 10*3/uL (ref 4.0–10.5)
nRBC: 0 % (ref 0.0–0.2)

## 2019-11-29 LAB — COMPREHENSIVE METABOLIC PANEL
ALT: 12 U/L (ref 0–44)
AST: 17 U/L (ref 15–41)
Albumin: 2.5 g/dL — ABNORMAL LOW (ref 3.5–5.0)
Alkaline Phosphatase: 58 U/L (ref 38–126)
Anion gap: 12 (ref 5–15)
BUN: 19 mg/dL (ref 8–23)
CO2: 27 mmol/L (ref 22–32)
Calcium: 9.3 mg/dL (ref 8.9–10.3)
Chloride: 99 mmol/L (ref 98–111)
Creatinine, Ser: 0.73 mg/dL (ref 0.44–1.00)
GFR, Estimated: >60 mL/min (ref >60)
Glucose, Bld: 190 mg/dL — ABNORMAL HIGH (ref 70–99)
Potassium: 2.7 mmol/L — CL (ref 3.5–5.1)
Sodium: 138 mmol/L (ref 135–145)
Total Bilirubin: 1.1 mg/dL (ref 0.3–1.2)
Total Protein: 5.8 g/dL — ABNORMAL LOW (ref 6.5–8.1)

## 2019-11-29 LAB — PROTIME-INR
INR: 1.3 — ABNORMAL HIGH (ref 0.8–1.2)
Prothrombin Time: 15.5 seconds — ABNORMAL HIGH (ref 11.4–15.2)

## 2019-11-29 LAB — GLUCOSE, CAPILLARY
Glucose-Capillary: 172 mg/dL — ABNORMAL HIGH (ref 70–99)
Glucose-Capillary: 185 mg/dL — ABNORMAL HIGH (ref 70–99)
Glucose-Capillary: 229 mg/dL — ABNORMAL HIGH (ref 70–99)
Glucose-Capillary: 198 mg/dL — ABNORMAL HIGH (ref 70–99)
Glucose-Capillary: 199 mg/dL — ABNORMAL HIGH (ref 70–99)

## 2019-11-29 LAB — URINE CULTURE

## 2019-11-29 LAB — MAGNESIUM: Magnesium: 1.7 mg/dL (ref 1.7–2.4)

## 2019-11-29 LAB — MRSA PCR SCREENING: MRSA by PCR: NEGATIVE

## 2019-11-29 LAB — PHOSPHORUS: Phosphorus: 1.7 mg/dL — ABNORMAL LOW (ref 2.5–4.6)

## 2019-11-29 SURGERY — RADIOLOGY WITH ANESTHESIA
Anesthesia: General

## 2019-11-29 MED ORDER — SODIUM CHLORIDE 0.9 % IV SOLN
1.0000 g | INTRAVENOUS | Status: AC
  Administered 2019-11-29 – 2019-12-01 (×3): 1 g via INTRAVENOUS
  Filled 2019-11-29 (×3): qty 1

## 2019-11-29 MED ORDER — NIMODIPINE 30 MG PO CAPS: 60.0000 mg | ORAL_CAPSULE | ORAL | Status: DC

## 2019-11-29 MED ORDER — NIMODIPINE 6 MG/ML PO SOLN
60.0000 mg | ORAL | Status: DC
  Administered 2019-11-29 – 2019-12-06 (×41): 60 mg
  Filled 2019-11-29 (×40): qty 10

## 2019-11-29 MED ORDER — SODIUM CHLORIDE 0.9 % IV SOLN
2.0000 g | INTRAVENOUS | Status: DC
  Filled 2019-11-29: qty 20

## 2019-11-29 MED ORDER — DILTIAZEM HCL 60 MG PO TABS
60.0000 mg | ORAL_TABLET | Freq: Four times a day (QID) | ORAL | Status: DC
  Administered 2019-11-29 – 2019-11-30 (×3): 60 mg via ORAL
  Filled 2019-11-29 (×5): qty 1

## 2019-11-29 MED ORDER — IOHEXOL 300 MG/ML  SOLN
100.0000 mL | Freq: Once | INTRAMUSCULAR | Status: AC | PRN
  Administered 2019-11-29: 58 mL via INTRAVENOUS

## 2019-11-29 MED ORDER — FENTANYL CITRATE (PF) 100 MCG/2ML IJ SOLN
INTRAMUSCULAR | Status: AC
  Filled 2019-11-29: qty 2

## 2019-11-29 MED ORDER — OSMOLITE 1.2 CAL PO LIQD
1000.0000 mL | ORAL | Status: DC
  Administered 2019-11-29 – 2019-12-06 (×6): 1000 mL
  Filled 2019-11-29 (×3): qty 1000

## 2019-11-29 MED ORDER — FENTANYL CITRATE (PF) 100 MCG/2ML IJ SOLN
INTRAMUSCULAR | Status: AC | PRN
Start: 2019-11-29 — End: 2019-11-29
  Administered 2019-11-29: 12.5 ug via INTRAVENOUS

## 2019-11-29 MED ORDER — LIDOCAINE HCL 1 % IJ SOLN
INTRAMUSCULAR | Status: AC
  Filled 2019-11-29: qty 20

## 2019-11-29 MED ORDER — POTASSIUM CHLORIDE 10 MEQ/100ML IV SOLN
10.0000 meq | INTRAVENOUS | Status: AC
  Administered 2019-11-29 (×8): 10 meq via INTRAVENOUS
  Filled 2019-11-29 (×8): qty 100

## 2019-11-29 MED ORDER — LEVETIRACETAM IN NACL 500 MG/100ML IV SOLN
500.0000 mg | Freq: Two times a day (BID) | INTRAVENOUS | Status: DC
  Administered 2019-11-29 – 2019-12-06 (×16): 500 mg via INTRAVENOUS
  Filled 2019-11-29 (×17): qty 100

## 2019-11-29 MED ORDER — FENTANYL CITRATE (PF) 250 MCG/5ML IJ SOLN
INTRAMUSCULAR | Status: AC
  Filled 2019-11-29: qty 5

## 2019-11-29 NOTE — Progress Notes (Signed)
Overall stable.  Patient with CT angiogram negative subarachnoid hemorrhage.  Plan catheter angiogram early next week per Dr. Kathyrn Sheriff.

## 2019-11-29 NOTE — Consult Note (Signed)
Chief Complaint: Patient was seen in consultation today for subarachnoid hemorrhage, DVT  Referring Physician(s): Dr. Tacy Learn  Supervising Physician: Daryll Brod  Patient Status: [facility] [place] - In-pt  History of Present Illness: Nicole [name] is a 79 y.o. female with past medical history of stage III lung cancer, a fib, CAD, DM, HTN, DVT on Eliquis who is known to IR from recent L1 and L2 kyphoplasty. Patient admitted to [facility] ED after episodes of confusion at home.  She was found to have an atraumatic subarachnoid hemorrhage like due to Eliquis use.  Her SAH is being followed by Neurosurgery and her Eliquis is now on hold.  IR consulted for IVC filter placement.   Patient assessed after EEG completion this AM.  She remains encephalopathic.  Occasionally yells out, but does not follow commands or interact.  NGT in place.   Case reviewed and approved by Dr. Annamaria Boots.   Past Medical History:  Diagnosis Date  . A-fib ([place])   . Adenocarcinoma of right lung, stage 3 ([place]) 07/28/2016  . Atrial flutter ([place]) 10/05/2016  . Bronchitis    hx of  . Cancer [facility]) 2004   uterine/cervical  . Cancer-related pain 10/06/2016  . Coronary artery disease 02/21/2017   JAN 2019 Prox RCA lesion is 25% stenosed. Prox Cx lesion is 30% stenosed. Mid Cx lesion is 25% stenosed. Ost 3rd Mrg lesion is 30% stenosed. Prox LAD lesion is 25% stenosed. Mid LAD lesion is 25% stenosed. The left ventricular systolic function is normal. LV end diastolic pressure is normal. The left ventricular ejection fraction is 55-65% by visual estimate. There is no aortic valve stenosis.   N  . Diabetes mellitus    type 2  . Edema 07/12/2017  . Essential hypertension 01/20/2009  . Gallstones   . Headache   . History of radiation therapy 08/09/2016 to 09/19/2016   Right lung was treated to 60 Gy in 30 fractions at 2 Gy per fraction  . Hypertension   . Hypertriglyceridemia 08/22/2012  . Low back pain   . Lung mass    with lymphadenopathy  .  Osteoarthritis   . Type II diabetes mellitus with manifestations ([place]) 01/20/2009   Estimated Creatinine Clearance: 49.8 mL/min (by C-G formula based on SCr of 1 mg/dL).    Past Surgical History:  Procedure Laterality Date  . ABDOMINAL HYSTERECTOMY  2004  . APPENDECTOMY  when 79 years old  . COLONOSCOPY  08-05-09   [name]  . I & D EXTREMITY Right 04/04/2019   Procedure: IRRIGATION AND DEBRIDEMENT RIGHT HAND;  Surgeon: Verner Mould, MD;  Location: [facility] ORS;  Service: Orthopedics;  Laterality: Right;  . INCONTINENCE SURGERY    . IR KYPHO EA ADDL LEVEL THORACIC OR LUMBAR  11/22/2019  . IR KYPHO LUMBAR INC FX REDUCE BONE BX UNI/BIL CANNULATION INC/IMAGING  11/22/2019  . IR RADIOLOGIST EVAL & MGMT  11/13/2019  . KNEE ARTHROSCOPY Left   . LEFT HEART CATH AND CORONARY ANGIOGRAPHY N/A 02/24/2017   Procedure: LEFT HEART CATH AND CORONARY ANGIOGRAPHY;  Surgeon: Jettie Booze, MD;  Location: [place] CV LAB;  Service: Cardiovascular;  Laterality: N/A;  . LEFT HEART CATHETERIZATION WITH CORONARY ANGIOGRAM N/A 07/19/2013   Procedure: LEFT HEART CATHETERIZATION WITH CORONARY ANGIOGRAM;  Surgeon: Sinclair Grooms, MD;  Location: [facility] CATH LAB;  Service: Cardiovascular;  Laterality: N/A;  . POLYPECTOMY  08-05-09   2 polyps  . TOTAL KNEE ARTHROPLASTY Left 03/28/2012   Procedure: LEFT TOTAL KNEE ARTHROPLASTY;  Surgeon: Tobi Bastos, MD;  Location: [facility];  Service: Orthopedics;  Laterality: Left;  . TUBAL LIGATION    . VIDEO BRONCHOSCOPY WITH ENDOBRONCHIAL ULTRASOUND N/A 07/11/2016   Procedure: VIDEO BRONCHOSCOPY WITH ENDOBRONCHIAL ULTRASOUND;  Surgeon: Marshell Garfinkel, MD;  Location: [place];  Service: Pulmonary;  Laterality: N/A;    Allergies: Patient has no known allergies.  Medications: Prior to Admission medications   Medication Sig Start Date End Date Taking? Authorizing Provider  acetaminophen (TYLENOL) 325 MG tablet Take 650 mg by mouth every 6 (six) hours as needed for  headache.   Yes [provider]  b complex vitamins tablet Take 1 tablet by mouth daily.   Yes [provider]  CALCIUM-VITAMIN D-VITAMIN K PO Take 2 tablets by mouth daily with breakfast. Calcium 500 mg vitamin d 200 unit vitamin k 40 mg    Yes [provider]  Collagenase POWD Apply 1 application topically daily as needed (skin care).    Yes [provider]  diltiazem (CARDIZEM CD) 360 MG 24 hr capsule TAKE 1 CAPSULE(360 MG) BY MOUTH DAILY Patient taking differently: Take 360 mg by mouth daily.  07/09/19  Yes Jettie Booze, MD  docusate sodium (COLACE) 100 MG capsule Take 100 mg by mouth daily.   Yes [provider]  ELIQUIS 5 MG TABS tablet Take 5 mg by mouth 2 (two) times daily.  09/25/19  Yes [provider]  insulin glargine, 2 Unit Dial, (TOUJEO MAX SOLOSTAR) 300 UNIT/ML Solostar Pen Inject 30 Units into the skin daily. Patient taking differently: Inject 20 Units into the skin daily.  10/14/19  Yes Burns, Claudina Lick, MD  LACTOBACILLUS PO Take 1 capsule by mouth daily. 3 billion cell   Yes [provider]  Lidocaine 4 % PTCH Apply 1 patch topically daily. Apply 1 patch in the morning, remove at night.   Yes [provider]  Lidocaine-Menthol 4-1 % PTCH Apply 1 patch topically daily. Apply to lower back   Yes [provider]  metFORMIN (GLUCOPHAGE) 500 MG tablet Take 1,000 mg by mouth daily with breakfast.   Yes [provider]  methocarbamol (ROBAXIN) 500 MG tablet Take 500 mg by mouth daily.   Yes [provider]  metoprolol tartrate (LOPRESSOR) 25 MG tablet Take 1 tablet every 8 hours AS NEEDED for rapid heart rate Patient taking differently: Take 25 mg by mouth every 8 (eight) hours as needed (hypertention).  08/01/18  Yes Fenton, Clint R, PA  Multiple Vitamins-Minerals (WOMENS MULTIVITAMIN PO) Take 1 tablet by mouth daily.   Yes [provider]  Omega-3 Fatty Acids (OMEGA III EPA+DHA  PO) Take 1 capsule by mouth daily. Fish oil 1200 mg, dha 144 mg, epa 216 mg    Yes [provider]  rosuvastatin (CRESTOR) 5 MG tablet TAKE 1 TABLET(5 MG) BY MOUTH DAILY Patient taking differently: TAKE 1 TABLET(5 MG) BY MOUTH DAILY 10/07/19  Yes [name], MD  torsemide (DEMADEX) 20 MG tablet Take 1 tablet (20 mg total) by mouth every other day. Patient taking differently: Take 20 mg by mouth daily.  08/28/19  Yes Fenton, Clint R, PA  traMADol (ULTRAM) 50 MG tablet Take 25 mg by mouth 2 (two) times daily as needed for moderate pain.   Yes [provider]  witch hazel-glycerin (TUCKS) pad Apply 1 application topically 3 (three) times daily.   Yes [provider]  CONTOUR NEXT TEST test strip USE 1 STRIP TO CHECK GLUCOSE TWICE DAILY  12/25/18   [name], MD  glucose blood test strip Contour Next Test Strips  USE 1 STRIP TO CHECK GLUCOSE TWICE DAILY    [provider]  Insulin Pen Needle (NOVOFINE) 32G X 6 MM MISC 1 Act by Does not apply route daily. 06/17/19   [name], MD  Lancets (ACCU-CHEK SOFT [facility]) lancets Use to check blood sugars daily Dx E11.9 06/18/19   [name], MD  irbesartan (AVAPRO) 300 MG tablet Take 1 tablet (300 mg total) by mouth daily. 12/12/18   [name], MD     Family History  Problem Relation Age of Onset  . Heart attack Father   . Heart disease Father   . Arthritis Other   . Cancer Other        colon, lst degree relative  . Diabetes Other        st degree relative  . Hyperlipidemia Other   . Hypertension Other   . Colon cancer Neg Hx     Social History   Socioeconomic History  . Marital status: Widowed    Spouse name: Not on file  . Number of children: 3  . Years of education: Not on file  . Highest education level: Not on file  Occupational History  . Occupation: retired    Fish farm manager: RETIRED  Tobacco Use  . Smoking status: Never Smoker  . Smokeless tobacco: Never Used  Vaping Use  .  Vaping Use: Never used  Substance and Sexual Activity  . Alcohol use: No    Alcohol/week: 0.0 standard drinks  . Drug use: No  . Sexual activity: Not Currently  Other Topics Concern  . Not on file  Social History Narrative   Regular exercise- Yes   Social Determinants of Health   Financial Resource Strain:   . Difficulty of Paying Living Expenses: Not on file  Food Insecurity: No Food Insecurity  . Worried About Charity fundraiser in the Last Year: Never true  . Ran Out of Food in the Last Year: Never true  Transportation Needs: No Transportation Needs  . Lack of Transportation (Medical): No  . Lack of Transportation (Non-Medical): No  Physical Activity:   . Days of Exercise per Week: Not on file  . Minutes of Exercise per Session: Not on file  Stress:   . Feeling of Stress : Not on file  Social Connections:   . Frequency of Communication with Friends and Family: Not on file  . Frequency of Social Gatherings with Friends and Family: Not on file  . Attends Religious Services: Not on file  . Active Member of Clubs or Organizations: Not on file  . Attends Archivist Meetings: Not on file  . Marital Status: Not on file     Review of Systems: A 12 point ROS discussed and pertinent positives are indicated in the HPI above.  All other systems are negative.  Review of Systems  Unable to perform ROS: Mental status change    Vital Signs: BP 115/64   Pulse 86   Temp 98.5 F (36.9 C) (Oral)   Resp (!) 22   Wt 167 lb 12.3 oz (76.1 kg)   SpO2 98%   BMI 27.08 kg/m   Physical Exam Constitutional:      General: She is not in acute distress.    Appearance: Normal appearance.  HENT:     Nose:     Comments: NGT in place    Mouth/Throat:  Mouth: Mucous membranes are moist.     Pharynx: Oropharynx is clear.  Cardiovascular:     Rate and Rhythm: Normal rate. Rhythm irregular.  Pulmonary:     Effort: Pulmonary effort is normal. No respiratory distress.      Breath sounds: Normal breath sounds.  Musculoskeletal:     Cervical back: Normal range of motion and neck supple.  Skin:    General: Skin is warm and dry.  Neurological:     General: No focal deficit present.     Mental Status: She is alert and oriented to person, place, and time. Mental status is at baseline.  Psychiatric:        Mood and Affect: Mood normal.        Behavior: Behavior normal.        Thought Content: Thought content normal.        Judgment: Judgment normal.      MD Evaluation Airway: WNL Heart: WNL Abdomen: WNL Chest/ Lungs: WNL ASA  Classification: 3 Mallampati/Airway Score: Two   Imaging: CT HEAD WO CONTRAST  Result Date: 11/28/2019 CLINICAL DATA:  Follow-up examination for subarachnoid hemorrhage. EXAM: CT HEAD WITHOUT CONTRAST TECHNIQUE: Contiguous axial images were obtained from the base of the skull through the vertex without intravenous contrast. COMPARISON:  Prior CT from 11/28/2019. FINDINGS: Brain: Moderate volume subarachnoid hemorrhage is overall slightly decreased in conspicuous as compared to previous exam. Preponderance of the blood again seen within the prepontine cistern with extension into the left sylvian fissure. Small volume intraventricular blood consistent with redistribution. Associated ventriculomegaly is not significantly changed without worsening hydrocephalus. Basilar cisterns remain patent with no midline shift or significant mass effect. No interval large vessel territory infarct. No extra-axial collection. Vascular: Largely obscured by subarachnoid hemorrhage. Skull: Scalp soft tissues and calvarium within normal limits and unchanged. Sinuses/Orbits: Globes and orbital soft tissues demonstrate no acute finding. Paranasal sinuses and mastoid air cells remain clear. Other: None. IMPRESSION: 1. Moderate volume subarachnoid hemorrhage, overall slightly decreased as compared to previous exam. 2. Small volume intraventricular blood consistent  with redistribution. Associated ventriculomegaly is not significantly changed without worsening hydrocephalus. 3. No other new acute intracranial abnormality. Electronically Signed   By: Jeannine Boga M.D.   On: 12/07/2019 02:46   CT HEAD WO CONTRAST  Addendum Date: 11/28/2019   ADDENDUM REPORT: 11/28/2019 12:22 ADDENDUM: Critical Value/emergent results were called by telephone at the time of interpretation on 11/28/2019 at 12:22 pm to provider [facility] , who verbally acknowledged these results. Electronically Signed   By: Marijo Conception M.D.   On: 11/28/2019 12:22   Result Date: 11/28/2019 CLINICAL DATA:  Delirium. EXAM: CT HEAD WITHOUT CONTRAST TECHNIQUE: Contiguous axial images were obtained from the base of the skull through the vertex without intravenous contrast. COMPARISON:  None. FINDINGS: Brain: Large amount of subarachnoid hemorrhage is noted throughout the brain, most prominently seen in the basal cisterns and left anterior hemispheric fissure and left sylvian fissure. Blood is also noted in the posterior horns of both lateral ventricles. Hemorrhage is also noted in the fourth ventricle. No ventricular dilatation is noted. No midline shift is noted. Subarachnoid hemorrhage is also noted in the para falcine and posterior parietal regions bilaterally. These findings are concerning for possible rupture of cerebral aneurysm. Vascular: No hyperdense vessel or unexpected calcification. Skull: Normal. Negative for fracture or focal lesion. Sinuses/Orbits: No acute finding. Other: None. IMPRESSION: Large amount of subarachnoid hemorrhage is noted throughout the brain, most prominently seen in the basal  cisterns and left anterior hemispheric fissure and left Sylvian fissure. Blood is also noted in the posterior horns of both lateral ventricles. Hemorrhage is also noted in the fourth ventricle. These findings are concerning for possible rupture of cerebral aneurysm. Further evaluation with MRA or CTA  is recommended. Electronically Signed: By: Marijo Conception M.D. On: 11/28/2019 12:19   CT CHEST WO CONTRAST  Result Date: 11/26/2019 CLINICAL DATA:  Pneumonia EXAM: CT CHEST WITHOUT CONTRAST TECHNIQUE: Multidetector CT imaging of the chest was performed following the standard protocol without IV contrast. COMPARISON:  July 18, 2019, May 07, 2018 FINDINGS: Cardiovascular: Enlarged cardiomediastinal silhouette. Three-vessel coronary artery atherosclerotic calcifications. Atherosclerotic calcifications of the aorta. No pericardial effusion. Bovine aortic arch. Mediastinum/Nodes: LEFT supraclavicular lymph node is increased in size in comparison to prior and measures 9 mm in the short axis, previously 5 mm (series 3, image 19). There are multiple adjacent prominent LEFT supraclavicular lymph nodes which have increased in size in comparison since 2020. Aortopulmonic lymph node measures 9 mm in the short axis, new since prior (series 3, image 56). Multiple thyroid nodules, the largest of which measures approximately 15 mm. Lungs/Pleura: Small to moderate LEFT pleural effusion. Evaluation of the parenchyma is limited secondary to respiratory motion. New RIGHT apical nodular versus consolidative opacity measuring 7 mm (series 3, image 19). Interlobular septal thickening with perivascular and peripheral predominant ground-glass opacities. Additional consolidative opacity within the RIGHT upper lobe is new since prior. Bibasilar atelectasis. Consolidative opacity along the RIGHT paramediastinal inferior border with associated bronchiectasis is unchanged. Upper Abdomen: Respiratory motion limits evaluation. Status post cholecystectomy. Small hiatal hernia. Musculoskeletal: No acute osseous abnormality. Degenerative changes of the thoracic spine. IMPRESSION: 1. Constellation of findings likely reflect pulmonary edema versus atypical infection. 2. New small to moderate LEFT pleural effusion. 3. New LEFT and mediastinal  supraclavicular lymphadenopathy, nonspecific and possibly reactive in etiology. Recommend attention on follow-up. 4. New 7 mm RIGHT apical nodular versus consolidative opacity. Non-contrast chest CT at 6-12 months is recommended. If the nodule is stable at time of repeat CT, then future CT at 18-24 months (from today's scan) is considered optional for low-risk patients, but is recommended for high-risk patients. This recommendation follows the consensus statement: Guidelines for Management of Incidental Pulmonary Nodules Detected on CT Images: From the Fleischner Society 2017; Radiology 2017; 284:228-243. 5. Increased conspicuity of a RIGHT thyroid nodule in comparison to priors. Recommend nonemergent evaluation with dedicated thyroid ultrasound. Aortic Atherosclerosis (ICD10-I70.0). Electronically Signed   By: Valentino Saxon MD   On: 12/13/2019 16:17   MR Lumbar Spine Wo Contrast  Result Date: 11/09/2019 CLINICAL DATA:  Low back pain.  Known compression fracture at L2. EXAM: MRI LUMBAR SPINE WITHOUT CONTRAST TECHNIQUE: Multiplanar, multisequence MR imaging of the lumbar spine was performed. No intravenous contrast was administered. COMPARISON:  Multiple exams, including lumbar spine CT from 10/24/2019 FINDINGS: Segmentation: Mildly transitional S1, but L5 is the lowest fully segmental lumbar type rib-bearing vertebra. Alignment: 4 mm degenerative anterolisthesis at L4-5 and 3 mm degenerative anterolisthesis at L3-4. Vertebrae: Compression fracture at L1 with superior endplate and central anterior incomplete transverse components but only about 20% loss of height. Associated vertebral edema and visible fracture plane transversely in the anterior inferior vertebral body. 50% compression fracture of L2 with marrow edema, similar to that shown on recent CT. Remote endplate compression fractures at L4. At the very bottom of the sagittal images we demonstrate a transverse fracture through the upper S3 vertebral  level (counting the  slightly transitional vertebra is S1). Conus medullaris and cauda equina: Conus extends to the L2 level. Conus and cauda equina appear normal. Paraspinal and other soft tissues: Suspected peripelvic cysts on the right. Mild paraspinal edema at the L1-2 level likely related to the known compression fractures. Disc levels: T12-L1: No impingement.  Mild disc bulge. L1-2: No impingement.  Notable right lateral intervertebral spurring L2-3: Borderline bilateral foraminal stenosis primarily from facet arthropathy. L4-5: Mild right foraminal stenosis due to disc bulge, intervertebral spurring, and facet arthropathy. Borderline central narrowing of the thecal sac. L4-5: Prominent central narrowing of the thecal sac with prominent right and moderate left foraminal stenosis due to disc uncovering, disc bulge, broad right paracentral disc protrusion extending into the neural foramen, and facet arthropathy. Cross-sectional area of the thecal sac is narrowed to 0.3 cm^2 at this level and the right disc protrusion extends into the lateral extraforaminal space with displacement of the right L4 spinal nerve. L5-S1: Mild to moderate left foraminal stenosis due to disc bulge and facet spurring. S1-2: No impingement.  Only rudimentary disc material at this level. IMPRESSION: 1. Acute to subacute compression fracture at L1 with only about 20% loss of height. This is newly apparent compared to the CT lumbar spine from 10/24/2019. 2. Subacute compression fracture of L2 with marrow edema, as shown on recent CT. Remote endplate compression fractures at L4. 3. Lumbar spondylosis and degenerative disc disease, causing severe impingement at L4-5; mild to moderate impingement at L5-S1; and mild impingement at L4-5, as detailed above. 4. Acute or subacute transverse fracture through the upper S3 vertebral level, included on the sagittal images only. Electronically Signed   By: Van Clines M.D.   On: 11/09/2019 14:09     DG Chest Port 1 View  Result Date: 12/10/2019 CLINICAL DATA:  Questionable sepsis - evaluate for abnormality EXAM: PORTABLE CHEST 1 VIEW COMPARISON:  07/29/2018 chest radiograph and prior. 07/18/2019 CT chest. FINDINGS: No pneumothorax, focal consolidation or pleural effusion. Cardiomediastinal silhouette within normal limits. Prominence of the perihilar vessels secondary to hypoinflation. Aortic atherosclerotic calcifications. No acute osseous abnormality. IMPRESSION: No focal airspace disease.  Hypoinflated lungs. Electronically Signed   By: Primitivo Gauze M.D.   On: 11/25/2019 10:26   DG Abd Portable 1V  Result Date: 12/07/2019 CLINICAL DATA:  Evaluate feeding tube placement EXAM: PORTABLE ABDOMEN - 1 VIEW COMPARISON:  None FINDINGS: Interval placement of feeding tube. The tube tip is well below the level of the GE junction. The tube appears looped within the gastric body and the tip is in the projection of the gastric fundus. IMPRESSION: Feeding tube tip is in the gastric fundus. Electronically Signed   By: Kerby Moors M.D.   On: 12/07/2019 11:51   EEG adult  Result Date: 12/06/2019 Lora Havens, MD     12/21/2019 12:14 PM Patient Name: Nicole [name] MRN: [id number] Epilepsy Attending: Lora Havens Referring Physician/Provider: Dr Jacky Kindle Date: 12/11/2019 Duration: 24.20 mins Patient history: 79yo F presented with ams and found to have Quesada. Now intermittently following commands. EEG to evaluate for seizure Level of alertness: Lethargic AEDs during EEG study: LEV Technical aspects: This EEG study was done with scalp electrodes positioned according to the 10-20 International system of electrode placement. Electrical activity was acquired at a sampling rate of 500Hz  and reviewed with a high frequency filter of 70Hz  and a low frequency filter of 1Hz . EEG data were recorded continuously and digitally stored. Description: No posterior dominant rhythm was noted.  EEG showed continuous  generalized 3 to 6 Hz theta-delta slowing.  Hyperventilation and photic stimulation were not performed.   ABNORMALITY - Continuous slow, generalized IMPRESSION: This study is suggestive of moderate diffuse encephalopathy, nonspecific etiology. No seizures or epileptiform discharges were seen throughout the recording. Priyanka O Yadav   IR KYPHO LUMBAR INC FX REDUCE BONE BX UNI/BIL CANNULATION INC/IMAGING  Result Date: 11/24/2019 INDICATION: Painful compression fractures at L1 and L2. CLINICAL DATA:  Low back pain secondary to compression fractures at L1 and L2. EXAM: IR KYPHO VERTEBRAL LUMBAR AUGMENTATION L1 and L2 COMPARISON:  MRI of the lumbosacral spine of November 09, 2019. MEDICATIONS: As antibiotic prophylaxis, Ancef 2 g IV was ordered pre-procedure and administered intravenously within 1 hour of incision. ANESTHESIA/SEDATION: Moderate (conscious) sedation was employed during this procedure. A total of Versed 1 mg and Fentanyl 25 mcg was administered intravenously. Moderate Sedation Time: 44 minutes. The patient's level of consciousness and vital signs were monitored continuously by radiology nursing throughout the procedure under my direct supervision. FLUOROSCOPY TIME:  Fluoroscopy Time: 18 minutes 0 seconds (675 mGy) COMPLICATIONS: None immediate. PROCEDURE: Following a full explanation of the procedure along with the potential associated complications, an informed witnessed consent was obtained. The patient was placed prone on the fluoroscopic table. The skin overlying the lumbar region was then prepped and draped in the usual sterile fashion. The right pedicle at L1, and the left pedicle at L2 were then infiltrated with 0.25% bupivacaine followed by the advancement of an 11-gauge Jamshidi needle through the right pedicle at L1, and left pedicle at L2 into the posterior one-third at both levels. These were then exchanged for a Kyphon advanced osteo introducer system comprised of a working cannula and a  Kyphon osteo drill. This combinations were then advanced over a Kyphon osteo bone pin until the tip of the Kyphon osteo drill was in the posterior third at L1 and at L2. At this time, the bone pin was removed. In a medial trajectory, the combination were advanced until the tips of the working cannulae was inside the posterior one-third at L1 and at L2. The osteo drills were removed. Through the working cannulae, a Kyphon inflatable bone tamp 20 x 3 was advanced and positioned with the distal marker 5 mm from the anterior aspect of L1, and L2. Crossing of the midline was seen on the AP projection. At this time, the balloons were expanded using contrast via a Kyphon inflation syringe device via microtubing. Inflations were continued until there was apposition with the superior endplate at L1, and the inferior endplate at L2. At this time, methylmethacrylate mixture was reconstituted with Tobramycin in the Kyphon bone mixing device system. This was then loaded onto the Kyphon bone fillers. The balloons were deflated and removed followed by the instillation of 5 bone filler equivalents of methylmethacrylate mixture at L1 and 4 bone filler equivalents of methylmethacrylate mixture at L2 with excellent filling in the AP and lateral projections. No extravasation was noted in the disk spaces or posteriorly into the spinal canal. No epidural venous contamination was seen. The working cannulae and the bone fillers were then retrieved and removed. Hemostasis was achieved at the skin entry sites. IMPRESSION: 1. Status post vertebral body augmentation using balloon kyphoplasty at L1 and L2 as described without event. Electronically Signed   By: Luanne Bras M.D.   On: 11/22/2019 16:01   IR KYPHO EA ADDL LEVEL THORACIC OR LUMBAR  Result Date: 11/24/2019 INDICATION: Painful compression fractures at L1  and L2. CLINICAL DATA:  Low back pain secondary to compression fractures at L1 and L2. EXAM: IR KYPHO VERTEBRAL LUMBAR  AUGMENTATION L1 and L2 COMPARISON:  MRI of the lumbosacral spine of November 09, 2019. MEDICATIONS: As antibiotic prophylaxis, Ancef 2 g IV was ordered pre-procedure and administered intravenously within 1 hour of incision. ANESTHESIA/SEDATION: Moderate (conscious) sedation was employed during this procedure. A total of Versed 1 mg and Fentanyl 25 mcg was administered intravenously. Moderate Sedation Time: 44 minutes. The patient's level of consciousness and vital signs were monitored continuously by radiology nursing throughout the procedure under my direct supervision. FLUOROSCOPY TIME:  Fluoroscopy Time: 18 minutes 0 seconds (212 mGy) COMPLICATIONS: None immediate. PROCEDURE: Following a full explanation of the procedure along with the potential associated complications, an informed witnessed consent was obtained. The patient was placed prone on the fluoroscopic table. The skin overlying the lumbar region was then prepped and draped in the usual sterile fashion. The right pedicle at L1, and the left pedicle at L2 were then infiltrated with 0.25% bupivacaine followed by the advancement of an 11-gauge Jamshidi needle through the right pedicle at L1, and left pedicle at L2 into the posterior one-third at both levels. These were then exchanged for a Kyphon advanced osteo introducer system comprised of a working cannula and a Kyphon osteo drill. This combinations were then advanced over a Kyphon osteo bone pin until the tip of the Kyphon osteo drill was in the posterior third at L1 and at L2. At this time, the bone pin was removed. In a medial trajectory, the combination were advanced until the tips of the working cannulae was inside the posterior one-third at L1 and at L2. The osteo drills were removed. Through the working cannulae, a Kyphon inflatable bone tamp 20 x 3 was advanced and positioned with the distal marker 5 mm from the anterior aspect of L1, and L2. Crossing of the midline was seen on the AP projection.  At this time, the balloons were expanded using contrast via a Kyphon inflation syringe device via microtubing. Inflations were continued until there was apposition with the superior endplate at L1, and the inferior endplate at L2. At this time, methylmethacrylate mixture was reconstituted with Tobramycin in the Kyphon bone mixing device system. This was then loaded onto the Kyphon bone fillers. The balloons were deflated and removed followed by the instillation of 5 bone filler equivalents of methylmethacrylate mixture at L1 and 4 bone filler equivalents of methylmethacrylate mixture at L2 with excellent filling in the AP and lateral projections. No extravasation was noted in the disk spaces or posteriorly into the spinal canal. No epidural venous contamination was seen. The working cannulae and the bone fillers were then retrieved and removed. Hemostasis was achieved at the skin entry sites. IMPRESSION: 1. Status post vertebral body augmentation using balloon kyphoplasty at L1 and L2 as described without event. Electronically Signed   By: Luanne Bras M.D.   On: 11/22/2019 16:01   ECHOCARDIOGRAM COMPLETE  Result Date: 11/28/2019    ECHOCARDIOGRAM REPORT   Patient Name:   Nicole [name] Date of Exam: 11/28/2019 Medical Rec #:  [id number]      Height:       66.0 in Accession #:    [id number]     Weight:       167.8 lb Date of Birth:  07/19/1940     BSA:          1.856 m Patient Age:    75 years  BP:           130/80 mmHg Patient Gender: F              HR:           84 bpm. Exam Location:  Inpatient Procedure: 2D Echo Indications:    atrial fibrillation 427.31  History:        Patient has prior history of Echocardiogram examinations, most                 recent 10/05/2016. CAD, Arrythmias:Atrial Fibrillation and Atrial                 Flutter; Risk Factors:Diabetes and Hypertension.  Sonographer:    Jannett Celestine RDCS (AE) Referring Phys: [id number] [facility]  Sonographer Comments: restricted mobility due to  lumbar injury. patient very sensitive to pain and unable to remain still at times as a result. off axis apical windows, unable to reposition IMPRESSIONS  1. Left ventricular ejection fraction, by estimation, is 60 to 65%. The left ventricle has normal function. The left ventricle has no regional wall motion abnormalities. There is mild concentric left ventricular hypertrophy. Left ventricular diastolic function could not be evaluated.  2. Right ventricular systolic function is normal. The right ventricular size is normal. There is normal pulmonary artery systolic pressure. The estimated right ventricular systolic pressure is 09.3 mmHg.  3. Left atrial size was moderately dilated.  4. Right atrial size was mildly dilated.  5. The mitral valve is normal in structure. Mild mitral valve regurgitation. No evidence of mitral stenosis. Moderate mitral annular calcification.  6. The aortic valve is normal in structure. There is mild calcification of the aortic valve. There is mild thickening of the aortic valve. Aortic valve regurgitation is mild. Mild to moderate aortic valve sclerosis/calcification is present, without any evidence of aortic stenosis.  7. The inferior vena cava is normal in size with greater than 50% respiratory variability, suggesting right atrial pressure of 3 mmHg. FINDINGS  Left Ventricle: Left ventricular ejection fraction, by estimation, is 60 to 65%. The left ventricle has normal function. The left ventricle has no regional wall motion abnormalities. The left ventricular internal cavity size was normal in size. There is  mild concentric left ventricular hypertrophy. Left ventricular diastolic function could not be evaluated due to atrial fibrillation. Left ventricular diastolic function could not be evaluated. Right Ventricle: The right ventricular size is normal. No increase in right ventricular wall thickness. Right ventricular systolic function is normal. There is normal pulmonary artery systolic  pressure. The tricuspid regurgitant velocity is 2.97 m/s, and  with an assumed right atrial pressure of 3 mmHg, the estimated right ventricular systolic pressure is 23.5 mmHg. Left Atrium: Left atrial size was moderately dilated. Right Atrium: Right atrial size was mildly dilated. Pericardium: There is no evidence of pericardial effusion. Mitral Valve: The mitral valve is normal in structure. There is mild thickening of the mitral valve leaflet(s). There is mild calcification of the mitral valve leaflet(s). Moderate mitral annular calcification. Mild mitral valve regurgitation. No evidence of mitral valve stenosis. Tricuspid Valve: The tricuspid valve is normal in structure. Tricuspid valve regurgitation is mild . No evidence of tricuspid stenosis. Aortic Valve: The aortic valve is normal in structure. There is mild calcification of the aortic valve. There is mild thickening of the aortic valve. Aortic valve regurgitation is mild. Mild to moderate aortic valve sclerosis/calcification is present, without any evidence of aortic stenosis. Pulmonic Valve: The pulmonic valve was normal  in structure. Pulmonic valve regurgitation is not visualized. No evidence of pulmonic stenosis. Aorta: The aortic root is normal in size and structure. Venous: The inferior vena cava is normal in size with greater than 50% respiratory variability, suggesting right atrial pressure of 3 mmHg. IAS/Shunts: No atrial level shunt detected by color flow Doppler.  LEFT VENTRICLE PLAX 2D LVIDd:         2.70 cm LVIDs:         2.07 cm LV PW:         1.10 cm LV IVS:        1.30 cm LVOT diam:     1.90 cm LV SV:         50 LV SV Index:   27 LVOT Area:     2.84 cm  LEFT ATRIUM             Index       RIGHT ATRIUM           Index LA diam:        4.60 cm 2.48 cm/m  RA Area:     11.90 cm LA Vol (A2C):   49.8 ml 26.83 ml/m RA Volume:   24.50 ml  13.20 ml/m LA Vol (A4C):   48.6 ml 26.19 ml/m LA Biplane Vol: 49.4 ml 26.62 ml/m  AORTIC VALVE LVOT Vmax:    90.70 cm/s LVOT Vmean:  68.400 cm/s LVOT VTI:    0.175 m  AORTA Ao Root diam: 2.80 cm MITRAL VALVE                TRICUSPID VALVE MV Area (PHT): 3.68 cm     TR Peak grad:   35.3 mmHg MV Decel Time: 206 msec     TR Vmax:        297.00 cm/s MV E velocity: 138.00 cm/s                             SHUNTS                             Systemic VTI:  0.18 m                             Systemic Diam: 1.90 cm Ena Dawley MD Electronically signed by Ena Dawley MD Signature Date/Time: 11/28/2019/4:26:01 PM    Final    VAS [place] LOWER EXTREMITY VENOUS (DVT) (ONLY [facility] & [facility])  Result Date: 12/17/2019  Lower Venous DVT Study Indications: Pain, Swelling, and Edema.  Risk Factors: Surgery Lumbar Spinal Fusion 11-22-2019. Performing Technologist: Griffin Basil RCT RDMS  Examination Guidelines: A complete evaluation includes B-mode imaging, spectral Doppler, color Doppler, and power Doppler as needed of all accessible portions of each vessel. Bilateral testing is considered an integral part of a complete examination. Limited examinations for reoccurring indications may be performed as noted. The reflux portion of the exam is performed with the patient in reverse Trendelenburg.  +---------+---------------+---------+-----------+----------+--------------+ RIGHT    CompressibilityPhasicitySpontaneityPropertiesThrombus Aging +---------+---------------+---------+-----------+----------+--------------+ CFV      Full           Yes      Yes                                 +---------+---------------+---------+-----------+----------+--------------+ SFJ  Full                                                        +---------+---------------+---------+-----------+----------+--------------+ FV Prox  Full                                                        +---------+---------------+---------+-----------+----------+--------------+ FV Mid   Full                                                         +---------+---------------+---------+-----------+----------+--------------+ FV DistalFull                                                        +---------+---------------+---------+-----------+----------+--------------+ PFV      Full                                                        +---------+---------------+---------+-----------+----------+--------------+ POP      Full           Yes      Yes                                 +---------+---------------+---------+-----------+----------+--------------+ PTV      Full                                                        +---------+---------------+---------+-----------+----------+--------------+ PERO     Full                                                        +---------+---------------+---------+-----------+----------+--------------+   +---------+---------------+---------+-----------+---------------+--------------+ LEFT     CompressibilityPhasicitySpontaneityProperties     Thrombus Aging +---------+---------------+---------+-----------+---------------+--------------+ CFV      Partial        Yes      Yes        softly         Acute                                                      echogenic                     +---------+---------------+---------+-----------+---------------+--------------+  SFJ      None                               softly         Acute                                                      echogenic                     +---------+---------------+---------+-----------+---------------+--------------+ FV Prox  Partial        Yes      Yes        softly         Acute                                                      echogenic                     +---------+---------------+---------+-----------+---------------+--------------+ FV Mid   None                               softly         Acute                                                      echogenic                      +---------+---------------+---------+-----------+---------------+--------------+ FV DistalNone                               softly         Acute                                                      echogenic                     +---------+---------------+---------+-----------+---------------+--------------+ PFV      None                               softly         Acute                                                      echogenic                     +---------+---------------+---------+-----------+---------------+--------------+ POP      None  softly         Acute                                                      echogenic                     +---------+---------------+---------+-----------+---------------+--------------+ PTV      None                               softly         Acute                                                      echogenic                     +---------+---------------+---------+-----------+---------------+--------------+ PERO     None                               softly         Acute                                                      echogenic                     +---------+---------------+---------+-----------+---------------+--------------+    Summary: RIGHT: - There is no evidence of deep vein thrombosis in the lower extremity.  - No cystic structure found in the popliteal fossa.  LEFT: - Findings consistent with acute deep vein thrombosis involving the left common femoral vein, SF junction, left femoral vein, left proximal profunda vein, left popliteal vein, left posterior tibial veins, and left peroneal veins. - No cystic structure found in the popliteal fossa.  *See table(s) above for measurements and observations. Electronically signed by Curt Jews MD on 12/04/2019 at 3:07:17 PM.    Final    CT ANGIO HEAD CODE STROKE  Result Date: 11/28/2019 CLINICAL  DATA:  Subarachnoid hemorrhage, follow-up EXAM: CT ANGIOGRAPHY HEAD TECHNIQUE: Multidetector CT imaging of the head was performed using the standard protocol during bolus administration of intravenous contrast. Multiplanar CT image reconstructions and MIPs were obtained to evaluate the vascular anatomy. CONTRAST:  28m OMNIPAQUE IOHEXOL 350 MG/ML SOLN COMPARISON:  None. FINDINGS: CTA HEAD Motion artifact is present. Anterior circulation: Intracranial internal carotid arteries are patent with atherosclerotic irregularity and calcified plaque. Middle and anterior cerebral arteries are patent. Posterior circulation: Intracranial vertebral, basilar, and posterior cerebral arteries are patent. There is atherosclerotic irregularity of the vertebral arteries with mixed plaque causing mild stenosis. Venous sinuses: Patent as permitted by contrast timing. Anatomic variants: None significant. Review of the MIP images confirms the above findings IMPRESSION: No aneurysm identified. Remains highly suspicious for ruptured aneurysm and catheter angiography is recommended. Electronically Signed   By: PMacy MisM.D.   On: 11/28/2019 13:37   IR Radiologist Eval & Mgmt  Result Date: 11/13/2019 Please refer to notes tab for details about interventional procedure. (Op Note)   Labs:  CBC: Recent Labs    12/17/2019 0954 11/28/19 0137 11/28/19 1235 11/30/2019 0023  WBC 13.7* 12.6* 12.9* 10.1  HGB 12.2 12.1 11.4* 12.1  HCT 39.1 36.6 34.8* 37.3  PLT 300 293 271 290    COAGS: Recent Labs    04/02/19 1313 04/02/19 1313 11/22/19 0849 12/07/2019 0954 11/28/19 1320 12/18/2019 0023  INR 1.1   < > 1.0 1.3* 1.7* 1.3*  APTT 29  --   --  32 34  --    < > = values in this interval not displayed.    BMP: Recent Labs    07/15/19 1156 07/15/19 1156 08/21/19 1420 08/21/19 1420 10/21/19 0940 10/21/19 0940 10/25/19 1135 11/22/19 0849 12/07/2019 0954 11/28/19 0137 11/28/19 1320 11/26/2019 0023  NA 140   < > 138   <  > 133*   < > 134*   < > 134* 134* 135 138  K 4.3   < > 4.1   < > 4.4   < > 3.5   < > 4.5 4.1 3.3* 2.7*  CL 108   < > 102   < > 103   < > 101   < > 98 99 100 99  CO2 23   < > 24   < > 25   < > 23   < > 16* 24 24 27   GLUCOSE 182*   < > 259*   < > 158*   < > 85   < > 261* 207* 205* 190*  BUN 34*   < > 24*   < > 23   < > 26*   < > 30* 31* 27* 19  CALCIUM 8.7*   < > 9.0   < > 8.6*   < > 8.6*   < > 8.9 9.3 8.7* 9.3  CREATININE 1.05*   < > 0.93   < > 1.01*  --  0.70   < > 1.63* 1.09* 0.93 0.73  GFRNONAA 52*   < > 60*   < > 54*  --  >60   < > 32* 52* >60 >60  GFRAA 60*  --  >60  --  >60  --  >60  --   --   --   --   --    < > = values in this interval not displayed.    LIVER FUNCTION TESTS: Recent Labs    10/21/19 0940 12/15/2019 0954 11/28/19 0137 12/17/2019 0023  BILITOT 0.6 1.6* 0.7 1.1  AST 13* 21 18 17   ALT 26 12 13 12   ALKPHOS 117 63 65 58  PROT 6.0* 5.9* 6.2* 5.8*  ALBUMIN 2.6* 2.6* 2.7* 2.5*    TUMOR MARKERS: No results for input(s): AFPTM, CEA, CA199, CHROMGRNA in the last 8760 hours.  Assessment and Plan: Acute DVT  Subarachnoid hemorrhage Patient with acute LLE DVT redemonstrated 11/3 via LE dopplers, admitted with subarachnoid hemorrhage, confusion.  Patient was previously on Eliquis, however this is now on hold.  IR consulted for IVC filter placement.  Patient encephalopathic/confusion.  Discussed filter placement risks and benefits with her son, Jonni Sanger, who consents for the procedure.   Risks and benefits discussed with the patient including, but not limited to bleeding, infection, contrast induced renal failure, filter fracture or migration which can lead to emergency surgery or even death, strut penetration with damage or irritation to adjacent structures and caval  thrombosis.  All of the patient's son's questions were answered, son is agreeable to proceed. Consent signed and in chart.  Thank you for this interesting consult.  I greatly enjoyed meeting Nicole [name] and  look forward to participating in their care.  A copy of this report was sent to the requesting provider on this date.  Electronically Signed: Docia Barrier, PA 12/17/2019, 1:54 PM   I spent a total of 40 Minutes    in face to face in clinical consultation, greater than 50% of which was counseling/coordinating care for LLE DVT.

## 2019-11-29 NOTE — Progress Notes (Signed)
PT Cancellation Note  Patient Details Name: Nicole [name] MRN: [id number] DOB: 1940-03-29   Cancelled Treatment:    Reason Eval/Treat Not Completed: Active bedrest order. Will continue to follow and evaluate when appropriate.   Karma Ganja, PT, DPT   Acute Rehabilitation Department Pager #: [phone]   Otho Bellows 12/23/2019, 9:09 AM

## 2019-11-29 NOTE — Progress Notes (Signed)
K+ 2.7 Replaced per protocol

## 2019-11-29 NOTE — Progress Notes (Signed)
CRITICAL VALUE ALERT  Critical Value: K of 2.7  Date & Time Notied:  12/14/2019 0235  Provider Notified: E Link   Orders Received/Actions taken: E link to place order for K replacement

## 2019-11-29 NOTE — Progress Notes (Signed)
SLP Cancellation Note  Patient Details Name: [name] MRN: [id number] DOB: 1940-09-15   Cancelled treatment:       Reason Eval/Treat Not Completed: Patient at procedure or test/unavailable. EEG in progress. Pt passed RN stroke swallow. Will f/u for cognitive linguistic eval.    Walton Digilio, Katherene Ponto 12/07/2019, 10:46 AM

## 2019-11-29 NOTE — Progress Notes (Signed)
Briefly saw the patient and spoke with patient's son and ICU attending. Per PCCM she will remain in ICU for few more days so [facility] will sign off.Continue plan of care as per ICU.

## 2019-11-29 NOTE — Procedures (Signed)
Cortrak  Person Inserting Tube:  Rosezetta Schlatter, RD Tube Type:  Cortrak - 43 inches Tube Location:  Right nare Initial Placement:  Stomach Secured by: Bridle Technique Used to Measure Tube Placement:  Documented cm marking at nare/ corner of mouth Cortrak Secured At:  72 cm Procedure Comments:  Cortrak Tube Team Note:  Consult received to place a Cortrak feeding tube.   X-ray is required due to some difficulties during placement, abdominal x-ray has been ordered by the Cortrak team. Please confirm tube placement before using the Cortrak tube.   If the tube becomes dislodged please keep the tube and contact the Cortrak team at [email or web address] ([email or web address]) for replacement.  If after hours and replacement cannot be delayed, place a NG tube and confirm placement with an abdominal x-ray.       Jarome Matin, MS, RD, LDN, CNSC Inpatient Clinical Dietitian RD pager # available in [place]  After hours/weekend pager # available in [facility]

## 2019-11-29 NOTE — Progress Notes (Signed)
EEG complete - results pending 

## 2019-11-29 NOTE — Procedures (Signed)
Patient Name: Nicole [name]  MRN: [id number]  Epilepsy Attending: Lora Havens  Referring Physician/Provider: Dr Jacky Kindle Date: 12/09/2019  Duration: 24.20 mins  Patient history: 79yo F presented with ams and found to have [place]. Now intermittently following commands. EEG to evaluate for seizure  Level of alertness: Lethargic  AEDs during EEG study: LEV  Technical aspects: This EEG study was done with scalp electrodes positioned according to the 10-20 International system of electrode placement. Electrical activity was acquired at a sampling rate of 500Hz  and reviewed with a high frequency filter of 70Hz  and a low frequency filter of 1Hz . EEG data were recorded continuously and digitally stored.   Description: No posterior dominant rhythm was noted.  EEG showed continuous generalized 3 to 6 Hz theta-delta slowing.  Hyperventilation and photic stimulation were not performed.     ABNORMALITY - Continuous slow, generalized  IMPRESSION: This study is suggestive of moderate diffuse encephalopathy, nonspecific etiology.  No seizures or epileptiform discharges were seen throughout the recording.  [name] [name]

## 2019-11-29 NOTE — Procedures (Signed)
Interventional Radiology Procedure Note  Procedure: IVC FILTER    Complications: None  Estimated Blood Loss:  MIN  Findings: BARD DENALI PLACED  FULL REPORT IN PACS    Tamera Punt, MD

## 2019-11-29 NOTE — Progress Notes (Signed)
OT Cancellation Note  Patient Details Name: CYRA SPADER MRN: [id number] DOB: 05/31/1940   Cancelled Treatment:    Reason Eval/Treat Not Completed: Active bedrest order  [place], OT/L   Acute OT Clinical Specialist Acute Rehabilitation Services Pager [phone] Office [phone]  12/04/2019, 9:00 AM

## 2019-11-29 NOTE — Progress Notes (Signed)
Initial Nutrition Assessment  DOCUMENTATION CODES:   Not applicable  INTERVENTION:   Tube Feeding via Cortrak:  Osmolite 1.2 at 60 ml/hr Begin TF at 20 ml/hr; titrate by 10 mL q 8 hours until goal rate Provides 80 g of protein, 1728 kcals, 1166 mL of free water Meets 100% estimated calorie and protein needs  NUTRITION DIAGNOSIS:   Inadequate oral intake related to acute illness, inability to eat as evidenced by NPO status.  GOAL:   Patient will meet greater than or equal to 90% of their needs  MONITOR:   Diet advancement, TF tolerance, Weight trends  REASON FOR ASSESSMENT:   Low Braden    ASSESSMENT:   Pt with PMH of recently dx DVT, chronic compression fxs s/p recent kyphoplasty, Afib, HTN, remote lung cancer, IDDM, HLD now admitted from rehab with AMS and sepsis from PNA vs UTI.   11/03 Admitted with poor po, N/V, fever 11/04 CT head with large SAH, concern for ruptured aneurysm but CT angiogram showed on aneurysm 11/05 Cortrak placed, EEG  NPO, Cortrak placed today; noted IR consulted for IVC filter for DVT  Noted pt recently underwent L1 and L2 kyphoplasty and went to [facility] for Rehab.   Unable to obtain diet and weight history from pt other than report of poor appetite with episodes of N/V upon admission.   Current wt 76 kg; weight has fluctuated up and down over the past year but appears relatively stable  Labs: reviewed Meds: NS at 100 ml/hr, ss novolog    Diet Order:   Diet Order            Diet NPO time specified Except for: Sips with Meds  Diet effective now                 EDUCATION NEEDS:   Not appropriate for education at this time  Skin:  Skin Assessment: Skin Integrity Issues: Skin Integrity Issues:: Stage I, Incisions Stage I: R buttocks Incisions: back  Last BM:  unknown  Height:   Ht Readings from Last 1 Encounters:  11/22/19 5\' 6"  (1.676 m)    Weight:   Wt Readings from Last 1 Encounters:  11/26/2019 76.1 kg     Ideal Body Weight:  59 kg  BMI:  Body mass index is 27.08 kg/m.  Estimated Nutritional Needs:   Kcal:  1650-1850 kcals  Protein:  80-90 g  Fluid:  >/= 1.7 L   Kerman Passey MS, RDN, LDN, CNSC Registered Dietitian III Clinical Nutrition RD Pager and On-Call Pager Number Located in [place]

## 2019-11-29 NOTE — Progress Notes (Signed)
Providing Compassionate, Quality Care - Together   Subjective: Patient's son is at bedside. He reports no significant change in his mother's condition. She is intermittently following commands. EEG tech at bedside applying leads during assessment.  Objective: Vital signs in last 24 hours: Temp:  [97.2 F (36.2 C)-98.7 F (37.1 C)] 98.5 F (36.9 C) (11/05 0800) Pulse Rate:  [25-107] 101 (11/05 0900) Resp:  [14-34] 26 (11/05 0900) BP: (114-147)/(68-92) 147/81 (11/05 0900) SpO2:  [88 %-100 %] 98 % (11/05 0900)  Intake/Output from previous day: 11/04 0701 - 11/05 0700 In: 2113.9 [I.V.:1178.9; IV Piggyback:935.1] Out: 3650 [Urine:3650] Intake/Output this shift: Total I/O In: 254.3 [I.V.:35.6; IV Piggyback:218.8] Out: 280 [Urine:280]  Alert, easily distractible PERRLA Follows commands with prompting Able to raise arms to command and move feet. Could not get patient to raise her lower extremities.   Lab Results: Recent Labs    11/28/19 1235 12/19/2019 0023  WBC 12.9* 10.1  HGB 11.4* 12.1  HCT 34.8* 37.3  PLT 271 290   BMET Recent Labs    11/28/19 1320 12/01/2019 0023  NA 135 138  K 3.3* 2.7*  CL 100 99  CO2 24 27  GLUCOSE 205* 190*  BUN 27* 19  CREATININE 0.93 0.73  CALCIUM 8.7* 9.3    Studies/Results: CT HEAD WO CONTRAST  Result Date: 12/16/2019 CLINICAL DATA:  Follow-up examination for subarachnoid hemorrhage. EXAM: CT HEAD WITHOUT CONTRAST TECHNIQUE: Contiguous axial images were obtained from the base of the skull through the vertex without intravenous contrast. COMPARISON:  Prior CT from 11/28/2019. FINDINGS: Brain: Moderate volume subarachnoid hemorrhage is overall slightly decreased in conspicuous as compared to previous exam. Preponderance of the blood again seen within the prepontine cistern with extension into the left sylvian fissure. Small volume intraventricular blood consistent with redistribution. Associated ventriculomegaly is not significantly  changed without worsening hydrocephalus. Basilar cisterns remain patent with no midline shift or significant mass effect. No interval large vessel territory infarct. No extra-axial collection. Vascular: Largely obscured by subarachnoid hemorrhage. Skull: Scalp soft tissues and calvarium within normal limits and unchanged. Sinuses/Orbits: Globes and orbital soft tissues demonstrate no acute finding. Paranasal sinuses and mastoid air cells remain clear. Other: None. IMPRESSION: 1. Moderate volume subarachnoid hemorrhage, overall slightly decreased as compared to previous exam. 2. Small volume intraventricular blood consistent with redistribution. Associated ventriculomegaly is not significantly changed without worsening hydrocephalus. 3. No other new acute intracranial abnormality. Electronically Signed   By: Jeannine Boga M.D.   On: 12/24/2019 02:46   CT HEAD WO CONTRAST  Addendum Date: 11/28/2019   ADDENDUM REPORT: 11/28/2019 12:22 ADDENDUM: Critical Value/emergent results were called by telephone at the time of interpretation on 11/28/2019 at 12:22 pm to provider [facility] , who verbally acknowledged these results. Electronically Signed   By: Marijo Conception M.D.   On: 11/28/2019 12:22   Result Date: 11/28/2019 CLINICAL DATA:  Delirium. EXAM: CT HEAD WITHOUT CONTRAST TECHNIQUE: Contiguous axial images were obtained from the base of the skull through the vertex without intravenous contrast. COMPARISON:  None. FINDINGS: Brain: Large amount of subarachnoid hemorrhage is noted throughout the brain, most prominently seen in the basal cisterns and left anterior hemispheric fissure and left sylvian fissure. Blood is also noted in the posterior horns of both lateral ventricles. Hemorrhage is also noted in the fourth ventricle. No ventricular dilatation is noted. No midline shift is noted. Subarachnoid hemorrhage is also noted in the para falcine and posterior parietal regions bilaterally. These findings are  concerning for  possible rupture of cerebral aneurysm. Vascular: No hyperdense vessel or unexpected calcification. Skull: Normal. Negative for fracture or focal lesion. Sinuses/Orbits: No acute finding. Other: None. IMPRESSION: Large amount of subarachnoid hemorrhage is noted throughout the brain, most prominently seen in the basal cisterns and left anterior hemispheric fissure and left Sylvian fissure. Blood is also noted in the posterior horns of both lateral ventricles. Hemorrhage is also noted in the fourth ventricle. These findings are concerning for possible rupture of cerebral aneurysm. Further evaluation with MRA or CTA is recommended. Electronically Signed: By: Marijo Conception M.D. On: 11/28/2019 12:19   CT CHEST WO CONTRAST  Result Date: 12/07/2019 CLINICAL DATA:  Pneumonia EXAM: CT CHEST WITHOUT CONTRAST TECHNIQUE: Multidetector CT imaging of the chest was performed following the standard protocol without IV contrast. COMPARISON:  July 18, 2019, May 07, 2018 FINDINGS: Cardiovascular: Enlarged cardiomediastinal silhouette. Three-vessel coronary artery atherosclerotic calcifications. Atherosclerotic calcifications of the aorta. No pericardial effusion. Bovine aortic arch. Mediastinum/Nodes: LEFT supraclavicular lymph node is increased in size in comparison to prior and measures 9 mm in the short axis, previously 5 mm (series 3, image 19). There are multiple adjacent prominent LEFT supraclavicular lymph nodes which have increased in size in comparison since 2020. Aortopulmonic lymph node measures 9 mm in the short axis, new since prior (series 3, image 56). Multiple thyroid nodules, the largest of which measures approximately 15 mm. Lungs/Pleura: Small to moderate LEFT pleural effusion. Evaluation of the parenchyma is limited secondary to respiratory motion. New RIGHT apical nodular versus consolidative opacity measuring 7 mm (series 3, image 19). Interlobular septal thickening with perivascular and  peripheral predominant ground-glass opacities. Additional consolidative opacity within the RIGHT upper lobe is new since prior. Bibasilar atelectasis. Consolidative opacity along the RIGHT paramediastinal inferior border with associated bronchiectasis is unchanged. Upper Abdomen: Respiratory motion limits evaluation. Status post cholecystectomy. Small hiatal hernia. Musculoskeletal: No acute osseous abnormality. Degenerative changes of the thoracic spine. IMPRESSION: 1. Constellation of findings likely reflect pulmonary edema versus atypical infection. 2. New small to moderate LEFT pleural effusion. 3. New LEFT and mediastinal supraclavicular lymphadenopathy, nonspecific and possibly reactive in etiology. Recommend attention on follow-up. 4. New 7 mm RIGHT apical nodular versus consolidative opacity. Non-contrast chest CT at 6-12 months is recommended. If the nodule is stable at time of repeat CT, then future CT at 18-24 months (from today's scan) is considered optional for low-risk patients, but is recommended for high-risk patients. This recommendation follows the consensus statement: Guidelines for Management of Incidental Pulmonary Nodules Detected on CT Images: From the Fleischner Society 2017; Radiology 2017; 284:228-243. 5. Increased conspicuity of a RIGHT thyroid nodule in comparison to priors. Recommend nonemergent evaluation with dedicated thyroid ultrasound. Aortic Atherosclerosis (ICD10-I70.0). Electronically Signed   By: Valentino Saxon MD   On: 12/19/2019 16:17   ECHOCARDIOGRAM COMPLETE  Result Date: 11/28/2019    ECHOCARDIOGRAM REPORT   Patient Name:   CLEOTA PELLERITO Date of Exam: 11/28/2019 Medical Rec #:  [id number]      Height:       66.0 in Accession #:    [id number]     Weight:       167.8 lb Date of Birth:  30-Apr-1940     BSA:          1.856 m Patient Age:    14 years       BP:           130/80 mmHg Patient Gender: F  HR:           84 bpm. Exam Location:  Inpatient Procedure:  2D Echo Indications:    atrial fibrillation 427.31  History:        Patient has prior history of Echocardiogram examinations, most                 recent 10/05/2016. CAD, Arrythmias:Atrial Fibrillation and Atrial                 Flutter; Risk Factors:Diabetes and Hypertension.  Sonographer:    Jannett Celestine RDCS (AE) Referring Phys: [id number] [facility]  Sonographer Comments: restricted mobility due to lumbar injury. patient very sensitive to pain and unable to remain still at times as a result. off axis apical windows, unable to reposition IMPRESSIONS  1. Left ventricular ejection fraction, by estimation, is 60 to 65%. The left ventricle has normal function. The left ventricle has no regional wall motion abnormalities. There is mild concentric left ventricular hypertrophy. Left ventricular diastolic function could not be evaluated.  2. Right ventricular systolic function is normal. The right ventricular size is normal. There is normal pulmonary artery systolic pressure. The estimated right ventricular systolic pressure is 32.9 mmHg.  3. Left atrial size was moderately dilated.  4. Right atrial size was mildly dilated.  5. The mitral valve is normal in structure. Mild mitral valve regurgitation. No evidence of mitral stenosis. Moderate mitral annular calcification.  6. The aortic valve is normal in structure. There is mild calcification of the aortic valve. There is mild thickening of the aortic valve. Aortic valve regurgitation is mild. Mild to moderate aortic valve sclerosis/calcification is present, without any evidence of aortic stenosis.  7. The inferior vena cava is normal in size with greater than 50% respiratory variability, suggesting right atrial pressure of 3 mmHg. FINDINGS  Left Ventricle: Left ventricular ejection fraction, by estimation, is 60 to 65%. The left ventricle has normal function. The left ventricle has no regional wall motion abnormalities. The left ventricular internal cavity size was normal in  size. There is  mild concentric left ventricular hypertrophy. Left ventricular diastolic function could not be evaluated due to atrial fibrillation. Left ventricular diastolic function could not be evaluated. Right Ventricle: The right ventricular size is normal. No increase in right ventricular wall thickness. Right ventricular systolic function is normal. There is normal pulmonary artery systolic pressure. The tricuspid regurgitant velocity is 2.97 m/s, and  with an assumed right atrial pressure of 3 mmHg, the estimated right ventricular systolic pressure is 92.4 mmHg. Left Atrium: Left atrial size was moderately dilated. Right Atrium: Right atrial size was mildly dilated. Pericardium: There is no evidence of pericardial effusion. Mitral Valve: The mitral valve is normal in structure. There is mild thickening of the mitral valve leaflet(s). There is mild calcification of the mitral valve leaflet(s). Moderate mitral annular calcification. Mild mitral valve regurgitation. No evidence of mitral valve stenosis. Tricuspid Valve: The tricuspid valve is normal in structure. Tricuspid valve regurgitation is mild . No evidence of tricuspid stenosis. Aortic Valve: The aortic valve is normal in structure. There is mild calcification of the aortic valve. There is mild thickening of the aortic valve. Aortic valve regurgitation is mild. Mild to moderate aortic valve sclerosis/calcification is present, without any evidence of aortic stenosis. Pulmonic Valve: The pulmonic valve was normal in structure. Pulmonic valve regurgitation is not visualized. No evidence of pulmonic stenosis. Aorta: The aortic root is normal in size and structure. Venous: The inferior vena cava is  normal in size with greater than 50% respiratory variability, suggesting right atrial pressure of 3 mmHg. IAS/Shunts: No atrial level shunt detected by color flow Doppler.  LEFT VENTRICLE PLAX 2D LVIDd:         2.70 cm LVIDs:         2.07 cm LV PW:         1.10  cm LV IVS:        1.30 cm LVOT diam:     1.90 cm LV SV:         50 LV SV Index:   27 LVOT Area:     2.84 cm  LEFT ATRIUM             Index       RIGHT ATRIUM           Index LA diam:        4.60 cm 2.48 cm/m  RA Area:     11.90 cm LA Vol (A2C):   49.8 ml 26.83 ml/m RA Volume:   24.50 ml  13.20 ml/m LA Vol (A4C):   48.6 ml 26.19 ml/m LA Biplane Vol: 49.4 ml 26.62 ml/m  AORTIC VALVE LVOT Vmax:   90.70 cm/s LVOT Vmean:  68.400 cm/s LVOT VTI:    0.175 m  AORTA Ao Root diam: 2.80 cm MITRAL VALVE                TRICUSPID VALVE MV Area (PHT): 3.68 cm     TR Peak grad:   35.3 mmHg MV Decel Time: 206 msec     TR Vmax:        297.00 cm/s MV E velocity: 138.00 cm/s                             SHUNTS                             Systemic VTI:  0.18 m                             Systemic Diam: 1.90 cm Ena Dawley MD Electronically signed by Ena Dawley MD Signature Date/Time: 11/28/2019/4:26:01 PM    Final    VAS [place] LOWER EXTREMITY VENOUS (DVT) (ONLY [facility] & [facility])  Result Date: 12/12/2019  Lower Venous DVT Study Indications: Pain, Swelling, and Edema.  Risk Factors: Surgery Lumbar Spinal Fusion 11-22-2019. Performing Technologist: Griffin Basil RCT RDMS  Examination Guidelines: A complete evaluation includes B-mode imaging, spectral Doppler, color Doppler, and power Doppler as needed of all accessible portions of each vessel. Bilateral testing is considered an integral part of a complete examination. Limited examinations for reoccurring indications may be performed as noted. The reflux portion of the exam is performed with the patient in reverse Trendelenburg.  +---------+---------------+---------+-----------+----------+--------------+ RIGHT    CompressibilityPhasicitySpontaneityPropertiesThrombus Aging +---------+---------------+---------+-----------+----------+--------------+ CFV      Full           Yes      Yes                                  +---------+---------------+---------+-----------+----------+--------------+ SFJ      Full                                                        +---------+---------------+---------+-----------+----------+--------------+  FV Prox  Full                                                        +---------+---------------+---------+-----------+----------+--------------+ FV Mid   Full                                                        +---------+---------------+---------+-----------+----------+--------------+ FV DistalFull                                                        +---------+---------------+---------+-----------+----------+--------------+ PFV      Full                                                        +---------+---------------+---------+-----------+----------+--------------+ POP      Full           Yes      Yes                                 +---------+---------------+---------+-----------+----------+--------------+ PTV      Full                                                        +---------+---------------+---------+-----------+----------+--------------+ PERO     Full                                                        +---------+---------------+---------+-----------+----------+--------------+   +---------+---------------+---------+-----------+---------------+--------------+ LEFT     CompressibilityPhasicitySpontaneityProperties     Thrombus Aging +---------+---------------+---------+-----------+---------------+--------------+ CFV      Partial        Yes      Yes        softly         Acute                                                      echogenic                     +---------+---------------+---------+-----------+---------------+--------------+ SFJ      None                               softly         Acute  echogenic                      +---------+---------------+---------+-----------+---------------+--------------+ FV Prox  Partial        Yes      Yes        softly         Acute                                                      echogenic                     +---------+---------------+---------+-----------+---------------+--------------+ FV Mid   None                               softly         Acute                                                      echogenic                     +---------+---------------+---------+-----------+---------------+--------------+ FV DistalNone                               softly         Acute                                                      echogenic                     +---------+---------------+---------+-----------+---------------+--------------+ PFV      None                               softly         Acute                                                      echogenic                     +---------+---------------+---------+-----------+---------------+--------------+ POP      None                               softly         Acute                                                      echogenic                     +---------+---------------+---------+-----------+---------------+--------------+  PTV      None                               softly         Acute                                                      echogenic                     +---------+---------------+---------+-----------+---------------+--------------+ PERO     None                               softly         Acute                                                      echogenic                     +---------+---------------+---------+-----------+---------------+--------------+    Summary: RIGHT: - There is no evidence of deep vein thrombosis in the lower extremity.  - No cystic structure found in the popliteal fossa.  LEFT: - Findings  consistent with acute deep vein thrombosis involving the left common femoral vein, SF junction, left femoral vein, left proximal profunda vein, left popliteal vein, left posterior tibial veins, and left peroneal veins. - No cystic structure found in the popliteal fossa.  *See table(s) above for measurements and observations. Electronically signed by Curt Jews MD on 11/28/2019 at 3:07:17 PM.    Final    CT ANGIO HEAD CODE STROKE  Result Date: 11/28/2019 CLINICAL DATA:  Subarachnoid hemorrhage, follow-up EXAM: CT ANGIOGRAPHY HEAD TECHNIQUE: Multidetector CT imaging of the head was performed using the standard protocol during bolus administration of intravenous contrast. Multiplanar CT image reconstructions and MIPs were obtained to evaluate the vascular anatomy. CONTRAST:  24mL OMNIPAQUE IOHEXOL 350 MG/ML SOLN COMPARISON:  None. FINDINGS: CTA HEAD Motion artifact is present. Anterior circulation: Intracranial internal carotid arteries are patent with atherosclerotic irregularity and calcified plaque. Middle and anterior cerebral arteries are patent. Posterior circulation: Intracranial vertebral, basilar, and posterior cerebral arteries are patent. There is atherosclerotic irregularity of the vertebral arteries with mixed plaque causing mild stenosis. Venous sinuses: Patent as permitted by contrast timing. Anatomic variants: None significant. Review of the MIP images confirms the above findings IMPRESSION: No aneurysm identified. Remains highly suspicious for ruptured aneurysm and catheter angiography is recommended. Electronically Signed   By: Macy Mis M.D.   On: 11/28/2019 13:37    Assessment/Plan: Patient with diffuse subarachnoid hemorrhage within her basilar cisterns and interhemispheric space. There is no evidence of any intraparenchymal bleeding. The patient subsequently underwent CT angiogram which demonstrated normal arterial anatomy without evidence of aneurysm, vascular malformation or other  obvious source of hemorrhage. CT scan 11/26/2019 shows slight decrease of SAH.   LOS: 2 days   -Arteriogram planned with Dr. Kathyrn Sheriff for next week -No further Neurosurgical interventions recommended at this time   Viona Gilmore, [place], AGNP-C Nurse Practitioner  [facility] [address], [place], [place] [address] P: [phone]    F: [phone]  11/28/2019, 10:25 AM

## 2019-11-29 NOTE — Consult Note (Signed)
NAME:  Nicole [name], MRN:  [id number], DOB:  1941-01-02, LOS: 2 ADMISSION DATE:  12/06/2019, CONSULTATION DATE:  11/28/2019 REFERRING MD:  Dr. Lupita Leash, CHIEF COMPLAINT:  SAH   Brief History   79 year old from rehab on Eliquis for Afib and acute DVT presenting with fever, confusion, leukocytosis admitted to [facility] with concern for sepsis found on [facility] today with acute SAH.    Past Medical History  HTN, HLD, CAD, Afib chronically on Eliquis (not compliant), adenocarcinoma of the lung, DMT2, OA, bronchitis, headaches   Significant Hospital Events   11/3 admitted to [facility]  Consults:  PCCM NSGY  Procedures:   Significant Diagnostic Tests:  11/3 Vascular LE doppler >>  RIGHT:  - There is no evidence of deep vein thrombosis in the lower extremity.  - No cystic structure found in the popliteal fossa.  LEFT:  - Findings consistent with acute deep vein thrombosis involving the left  common femoral vein, SF junction, left femoral vein, left proximal  profunda vein, left popliteal vein, left posterior tibial veins, and left  peroneal veins.  - No cystic structure found in the popliteal fossa.   11/3 CT chest  >> 1. Constellation of findings likely reflect pulmonary edema versus atypical infection. 2. New small to moderate LEFT pleural effusion. 3. New LEFT and mediastinal supraclavicular lymphadenopathy, nonspecific and possibly reactive in etiology. Recommend attention on follow-up. 4. New 7 mm RIGHT apical nodular versus consolidative opacity. Non-contrast chest CT at 6-12 months is recommended. If the nodule is stable at time of repeat CT, then future CT at 18-24 months (from today's scan) 5. Increased conspicuity of a RIGHT thyroid nodule in comparison to priors. Recommend nonemergent evaluation with dedicated thyroid ultrasound.  11/4 CTH >> Large amount of subarachnoid hemorrhage is noted throughout the brain, most prominently seen in the basal cisterns and left anterior hemispheric fissure  and left Sylvian fissure. Blood is also noted in the posterior horns of both lateral ventricles. Hemorrhage is also noted in the fourth ventricle. These findings are concerning for possible rupture of cerebral aneurysm. Further evaluation with MRA or CTA is recommended.  11/4 CTA head/ neck >> No aneurysm identified. Remains highly suspicious for ruptured aneurysm and catheter angiography is recommended.  Micro Data:  11/3 SARS2 / flu  >> neg 11/3 UC >> multiple species 11/3 BCx2 >> 2/4 methicillin resistant stap epi  Antimicrobials:  11/3 cefepime >> 11/5 11/3 azithromycin >> 11/5 ceftriaxone  Interim history/subjective:  Patient was seen by neurosurgery, recommend watchful waiting, no plan for cerebral angiogram at this point. She is confused, intermittently following commands Objective   Blood pressure (!) 147/81, pulse (!) 101, temperature 98.5 F (36.9 C), temperature source Oral, resp. rate (!) 26, weight 76.1 kg, SpO2 98 %.        Intake/Output Summary (Last 24 hours) at 12/19/2019 1149 Last data filed at 12/16/2019 3818 Gross per 24 hour  Intake 2368.27 ml  Output 3130 ml  Net -761.73 ml   Filed Weights   11/30/2019 2020  Weight: 76.1 kg   Examination: General:  Chronically ill appearing elderly female lying in bed in NAD HEENT: MM pink/moist, pupils 3/non reactive Neuro: Opens eyes to verbal stimuli, following simple commands intermittently, antigravity bilateral upper extremities, lower extremities with minimal movement bilaterally  CV: Irregularly irregular, no murmur or gallop PULM:  Non labored, scattered rales on right, clear on left, diminished in bases GI: soft, +bs Extremities: warm/dry, 3+ LE edema  Skin: no rashes, left  lower extremities reddish, warm to touch  Resolved Hospital Problem list    Assessment & Plan:  Atraumatic subarachnoid hemorrhage, likely in the setting of anticoagulation Patient had CT angiogram of head not suggestive of any  aneurysm Neurosurgery recommend watchful waiting and no plan for cerebral angiogram as of now Patient received Kcentra yesterday Continue neurochecks every hour Continue nimodipine for now Place cortrak Maintain systolic blood pressure 696 - 140 millimeters mercury  Atrial fibrillation with rapid ventricular response Patient's heart rate is still not well controlled Continue diltiazem infusion Started on oral diltiazem 60 mg every 6 hours, will try to titrate off infusion  Sepsis/ SIRS of unclear etiology Patient's urine culture grew multiple organisms Repeat urine culture CT chest is suggestive of groundglass appearance Change cefepime to ceftriaxone, continue azithromycin to complete 5 days empiric therapy  CT chest likely pulmonary edema vs atypical infection, and new left and mediastinal supraclavicular lymphadenopathy, new 52mm right apical nodule vs consolidative opacity; also noted to have increased conspicuity of right thyroid nodule; will need further workup for these nodules given her hx of lung adenocarcinoma  AKI/ NGMA-resolved Continue to monitor  Acute left lower extremity DVT Due to subarachnoid hemorrhage, patient cannot be started on anticoagulation IR consult is requested for IVC filter placement  Hypokalemia Patient serum potassium is 2.7, continue aggressive supplementation Repeat serum potassium in the morning  DMT2 - SSI sensitive/ CBG q 4hrs   Best practice:  Diet: NPO, will start tube feed after cortrak placement Pain/Anxiety/Delirium protocol (if indicated): n/a VAP protocol (if indicated): n/a DVT prophylaxis: Holding off due to subarachnoid hemorrhage GI prophylaxis:n/a Glucose control: Sliding scale Mobility: Out of bed to chair Code Status: Full code Family Communication: Son updated at bedside Disposition: ICU  Labs   CBC: Recent Labs  Lab 12/10/2019 0954 11/28/19 0137 11/28/19 1235 12/11/2019 0023  WBC 13.7* 12.6* 12.9* 10.1  NEUTROABS  11.6*  --   --   --   HGB 12.2 12.1 11.4* 12.1  HCT 39.1 36.6 34.8* 37.3  MCV 96.5 93.4 92.6 94.7  PLT 300 293 271 295    Basic Metabolic Panel: Recent Labs  Lab 12/14/2019 0954 11/28/19 0137 11/28/19 1320 12/23/2019 0023  NA 134* 134* 135 138  K 4.5 4.1 3.3* 2.7*  CL 98 99 100 99  CO2 16* 24 24 27   GLUCOSE 261* 207* 205* 190*  BUN 30* 31* 27* 19  CREATININE 1.63* 1.09* 0.93 0.73  CALCIUM 8.9 9.3 8.7* 9.3   GFR: Estimated Creatinine Clearance: 61.4 mL/min (by C-G formula based on SCr of 0.73 mg/dL). Recent Labs  Lab 12/19/2019 0954 12/04/2019 1353 11/28/19 0137 11/28/19 1235 11/28/2019 0023  WBC 13.7*  --  12.6* 12.9* 10.1  LATICACIDVEN 2.7* 2.2*  --   --   --     Liver Function Tests: Recent Labs  Lab 12/18/2019 0954 11/28/19 0137 12/03/2019 0023  AST 21 18 17   ALT 12 13 12   ALKPHOS 63 65 58  BILITOT 1.6* 0.7 1.1  PROT 5.9* 6.2* 5.8*  ALBUMIN 2.6* 2.7* 2.5*   No results for input(s): LIPASE, AMYLASE in the last 168 hours. No results for input(s): AMMONIA in the last 168 hours.  ABG    Component Value Date/Time   HCO3 20.6 11/28/2019 2024   ACIDBASEDEF 2.7 (H) 12/05/2019 2024   O2SAT 86.3 12/12/2019 2024     Coagulation Profile: Recent Labs  Lab 12/11/2019 0954 11/28/19 1320 12/13/2019 0023  INR 1.3* 1.7* 1.3*    Cardiac Enzymes:  Recent Labs  Lab 12/15/2019 2024  CKTOTAL 74    HbA1C: Hemoglobin A1C  Date/Time Value Ref Range Status  06/17/2019 04:58 PM 8.8 (A) 4.0 - 5.6 % Final   Hgb A1c MFr Bld  Date/Time Value Ref Range Status  12/07/2019 08:24 PM 6.4 (H) 4.8 - 5.6 % Final    Comment:    (NOTE) Pre diabetes:          5.7%-6.4%  Diabetes:              >6.4%  Glycemic control for   <7.0% adults with diabetes   04/04/2019 05:33 AM 8.6 (H) 4.8 - 5.6 % Final    Comment:    (NOTE) Pre diabetes:          5.7%-6.4% Diabetes:              >6.4% Glycemic control for   <7.0% adults with diabetes     CBG: Recent Labs  Lab 11/28/19 1536  11/28/19 1909 11/28/19 2258 12/13/2019 0316 12/09/2019 0805  GLUCAP 216* 178* 198* 172* 185*    Review of Systems:   Unable   Past Medical History  She,  has a past medical history of A-fib ([place]), Adenocarcinoma of right lung, stage 3 ([place]) (07/28/2016), Atrial flutter ([place]) (10/05/2016), Bronchitis, Cancer ([place]) (2004), Cancer-related pain (10/06/2016), Coronary artery disease (02/21/2017), Diabetes mellitus, Edema (07/12/2017), Essential hypertension (01/20/2009), Gallstones, Headache, History of radiation therapy (08/09/2016 to 09/19/2016), Hypertension, Hypertriglyceridemia (08/22/2012), Low back pain, Lung mass, Osteoarthritis, and Type II diabetes mellitus with manifestations ([place]) (01/20/2009).   Surgical History    Past Surgical History:  Procedure Laterality Date  . ABDOMINAL HYSTERECTOMY  2004  . APPENDECTOMY  when 79 years old  . COLONOSCOPY  08-05-09   [name]  . I & D EXTREMITY Right 04/04/2019   Procedure: IRRIGATION AND DEBRIDEMENT RIGHT HAND;  Surgeon: Verner Mould, MD;  Location: [facility];  Service: Orthopedics;  Laterality: Right;  . INCONTINENCE SURGERY    . IR KYPHO EA ADDL LEVEL THORACIC OR LUMBAR  11/22/2019  . IR KYPHO LUMBAR INC FX REDUCE BONE BX UNI/BIL CANNULATION INC/IMAGING  11/22/2019  . IR RADIOLOGIST EVAL & MGMT  11/13/2019  . KNEE ARTHROSCOPY Left   . LEFT HEART CATH AND CORONARY ANGIOGRAPHY N/A 02/24/2017   Procedure: LEFT HEART CATH AND CORONARY ANGIOGRAPHY;  Surgeon: Jettie Booze, MD;  Location: [facility];  Service: Cardiovascular;  Laterality: N/A;  . LEFT HEART CATHETERIZATION WITH CORONARY ANGIOGRAM N/A 07/19/2013   Procedure: LEFT HEART CATHETERIZATION WITH CORONARY ANGIOGRAM;  Surgeon: Sinclair Grooms, MD;  Location: [facility] CATH LAB;  Service: Cardiovascular;  Laterality: N/A;  . POLYPECTOMY  08-05-09   2 polyps  . TOTAL KNEE ARTHROPLASTY Left 03/28/2012   Procedure: LEFT TOTAL KNEE ARTHROPLASTY;  Surgeon: Tobi Bastos, MD;   Location: [facility];  Service: Orthopedics;  Laterality: Left;  . TUBAL LIGATION    . VIDEO BRONCHOSCOPY WITH ENDOBRONCHIAL ULTRASOUND N/A 07/11/2016   Procedure: VIDEO BRONCHOSCOPY WITH ENDOBRONCHIAL ULTRASOUND;  Surgeon: Marshell Garfinkel, MD;  Location: [facility];  Service: Pulmonary;  Laterality: N/A;     Social History   reports that she has never smoked. She has never used smokeless tobacco. She reports that she does not drink alcohol and does not use drugs.   Family History   Her family history includes Arthritis in an other family member; Cancer in an other family member; Diabetes in an other family member; Heart attack in her  father; Heart disease in her father; Hyperlipidemia in an other family member; Hypertension in an other family member. There is no history of Colon cancer.   Allergies No Known Allergies   Home Medications  Prior to Admission medications   Medication Sig Start Date End Date Taking? Authorizing Provider  acetaminophen (TYLENOL) 325 MG tablet Take 650 mg by mouth every 6 (six) hours as needed for headache.   Yes [provider]  b complex vitamins tablet Take 1 tablet by mouth daily.   Yes [provider]  CALCIUM-VITAMIN D-VITAMIN K PO Take 2 tablets by mouth daily with breakfast. Calcium 500 mg vitamin d 200 unit vitamin k 40 mg    Yes [provider]  Collagenase POWD Apply 1 application topically daily as needed (skin care).    Yes [provider]  diltiazem (CARDIZEM CD) 360 MG 24 hr capsule TAKE 1 CAPSULE(360 MG) BY MOUTH DAILY Patient taking differently: Take 360 mg by mouth daily.  07/09/19  Yes Jettie Booze, MD  docusate sodium (COLACE) 100 MG capsule Take 100 mg by mouth daily.   Yes [provider]  ELIQUIS 5 MG TABS tablet Take 5 mg by mouth 2 (two) times daily.  09/25/19  Yes [provider]  insulin glargine, 2 Unit Dial, (TOUJEO MAX SOLOSTAR) 300 UNIT/ML Solostar Pen Inject 30 Units into the skin  daily. Patient taking differently: Inject 20 Units into the skin daily.  10/14/19  Yes Burns, Claudina Lick, MD  LACTOBACILLUS PO Take 1 capsule by mouth daily. 3 billion cell   Yes [provider]  Lidocaine 4 % PTCH Apply 1 patch topically daily. Apply 1 patch in the morning, remove at night.   Yes [provider]  Lidocaine-Menthol 4-1 % PTCH Apply 1 patch topically daily. Apply to lower back   Yes [provider]  metFORMIN (GLUCOPHAGE) 500 MG tablet Take 1,000 mg by mouth daily with breakfast.   Yes [provider]  methocarbamol (ROBAXIN) 500 MG tablet Take 500 mg by mouth daily.   Yes [provider]  metoprolol tartrate (LOPRESSOR) 25 MG tablet Take 1 tablet every 8 hours AS NEEDED for rapid heart rate Patient taking differently: Take 25 mg by mouth every 8 (eight) hours as needed (hypertention).  08/01/18  Yes Fenton, Clint R, PA  Multiple Vitamins-Minerals (WOMENS MULTIVITAMIN PO) Take 1 tablet by mouth daily.   Yes [provider]  Omega-3 Fatty Acids (OMEGA III EPA+DHA PO) Take 1 capsule by mouth daily. Fish oil 1200 mg, dha 144 mg, epa 216 mg    Yes [provider]  rosuvastatin (CRESTOR) 5 MG tablet TAKE 1 TABLET(5 MG) BY MOUTH DAILY Patient taking differently: TAKE 1 TABLET(5 MG) BY MOUTH DAILY 10/07/19  Yes [name], MD  torsemide (DEMADEX) 20 MG tablet Take 1 tablet (20 mg total) by mouth every other day. Patient taking differently: Take 20 mg by mouth daily.  08/28/19  Yes Fenton, Clint R, PA  traMADol (ULTRAM) 50 MG tablet Take 25 mg by mouth 2 (two) times daily as needed for moderate pain.   Yes [provider]  witch hazel-glycerin (TUCKS) pad Apply 1 application topically 3 (three) times daily.   Yes [provider]  CONTOUR NEXT TEST test strip USE 1 STRIP TO CHECK GLUCOSE TWICE DAILY 12/25/18   [name], MD  glucose blood test strip Contour Next Test Strips  USE 1 STRIP TO CHECK GLUCOSE TWICE  DAILY  [provider]  Insulin Pen Needle (NOVOFINE) 32G X 6 MM MISC 1 Act by Does not apply route daily. 06/17/19   [name], MD  Lancets (ACCU-CHEK SOFT [facility] lancets Use to check blood sugars daily Dx E11.9 06/18/19   [name], MD  irbesartan (AVAPRO) 300 MG tablet Take 1 tablet (300 mg total) by mouth daily. 12/12/18   [name], MD     Total critical care time: 48 minutes  Performed by: [place] care time was exclusive of separately billable procedures and treating other patients.   Critical care was necessary to treat or prevent imminent or life-threatening deterioration.   Critical care was time spent personally by me on the following activities: development of treatment plan with patient and/or surrogate as well as nursing, discussions with consultants, evaluation of patient's response to treatment, examination of patient, obtaining history from patient or surrogate, ordering and performing treatments and interventions, ordering and review of laboratory studies, ordering and review of radiographic studies, pulse oximetry and re-evaluation of patient's condition.   Jacky Kindle MD Critical care physician [facility]  Pager: [phone] Mobile: [phone]

## 2019-11-30 ENCOUNTER — Inpatient Hospital Stay (HOSPITAL_COMMUNITY): Payer: Medicare Other

## 2019-11-30 DIAGNOSIS — I609 Nontraumatic subarachnoid hemorrhage, unspecified: Secondary | ICD-10-CM

## 2019-11-30 LAB — CBC
HCT: 32.1 % — ABNORMAL LOW (ref 36.0–46.0)
Hemoglobin: 10.3 g/dL — ABNORMAL LOW (ref 12.0–15.0)
MCH: 30.3 pg (ref 26.0–34.0)
MCHC: 32.1 g/dL (ref 30.0–36.0)
MCV: 94.4 fL (ref 80.0–100.0)
Platelets: 276 10*3/uL (ref 150–400)
RBC: 3.4 MIL/uL — ABNORMAL LOW (ref 3.87–5.11)
RDW: 14.5 % (ref 11.5–15.5)
WBC: 10.1 10*3/uL (ref 4.0–10.5)
nRBC: 0 % (ref 0.0–0.2)

## 2019-11-30 LAB — BLOOD CULTURE ID PANEL (REFLEXED) - BCID2

## 2019-11-30 LAB — GLUCOSE, CAPILLARY
Glucose-Capillary: 216 mg/dL — ABNORMAL HIGH (ref 70–99)
Glucose-Capillary: 240 mg/dL — ABNORMAL HIGH (ref 70–99)
Glucose-Capillary: 247 mg/dL — ABNORMAL HIGH (ref 70–99)
Glucose-Capillary: 248 mg/dL — ABNORMAL HIGH (ref 70–99)
Glucose-Capillary: 262 mg/dL — ABNORMAL HIGH (ref 70–99)
Glucose-Capillary: 276 mg/dL — ABNORMAL HIGH (ref 70–99)
Glucose-Capillary: 283 mg/dL — ABNORMAL HIGH (ref 70–99)

## 2019-11-30 LAB — CULTURE, BLOOD (ROUTINE X 2)

## 2019-11-30 LAB — COMPREHENSIVE METABOLIC PANEL
ALT: 14 U/L (ref 0–44)
AST: 18 U/L (ref 15–41)
Albumin: 2.1 g/dL — ABNORMAL LOW (ref 3.5–5.0)
Alkaline Phosphatase: 56 U/L (ref 38–126)
Anion gap: 9 (ref 5–15)
BUN: 18 mg/dL (ref 8–23)
CO2: 24 mmol/L (ref 22–32)
Calcium: 8.9 mg/dL (ref 8.9–10.3)
Chloride: 104 mmol/L (ref 98–111)
Creatinine, Ser: 0.67 mg/dL (ref 0.44–1.00)
GFR, Estimated: >60 mL/min (ref >60)
Glucose, Bld: 266 mg/dL — ABNORMAL HIGH (ref 70–99)
Potassium: 3.6 mmol/L (ref 3.5–5.1)
Sodium: 137 mmol/L (ref 135–145)
Total Bilirubin: 0.6 mg/dL (ref 0.3–1.2)
Total Protein: 5 g/dL — ABNORMAL LOW (ref 6.5–8.1)

## 2019-11-30 LAB — PHOSPHORUS
Phosphorus: 1.9 mg/dL — ABNORMAL LOW (ref 2.5–4.6)
Phosphorus: 2.3 mg/dL — ABNORMAL LOW (ref 2.5–4.6)

## 2019-11-30 LAB — MAGNESIUM
Magnesium: 1.6 mg/dL — ABNORMAL LOW (ref 1.7–2.4)
Magnesium: 2.3 mg/dL (ref 1.7–2.4)

## 2019-11-30 LAB — T4, FREE: Free T4: 1.68 ng/dL — ABNORMAL HIGH (ref 0.61–1.12)

## 2019-11-30 MED ORDER — DILTIAZEM HCL 90 MG PO TABS
90.0000 mg | ORAL_TABLET | Freq: Four times a day (QID) | ORAL | Status: DC
  Filled 2019-11-30: qty 1

## 2019-11-30 MED ORDER — INSULIN ASPART 100 UNIT/ML ~~LOC~~ SOLN
4.0000 [IU] | SUBCUTANEOUS | Status: DC
  Administered 2019-11-30 – 2019-12-02 (×12): 4 [IU] via SUBCUTANEOUS

## 2019-11-30 MED ORDER — ACETAMINOPHEN 325 MG PO TABS
650.0000 mg | ORAL_TABLET | Freq: Four times a day (QID) | ORAL | Status: DC | PRN
  Administered 2019-11-30 – 2019-12-06 (×12): 650 mg
  Filled 2019-11-30 (×11): qty 2

## 2019-11-30 MED ORDER — MAGNESIUM SULFATE 2 GM/50ML IV SOLN
2.0000 g | Freq: Once | INTRAVENOUS | Status: AC
  Administered 2019-11-30: 2 g via INTRAVENOUS
  Filled 2019-11-30: qty 50

## 2019-11-30 MED ORDER — ACETAMINOPHEN 650 MG RE SUPP: 650.0000 mg | Freq: Four times a day (QID) | RECTAL | Status: DC | PRN

## 2019-11-30 MED ORDER — POTASSIUM PHOSPHATES 15 MMOLE/5ML IV SOLN
30.0000 mmol | Freq: Once | INTRAVENOUS | Status: AC
  Administered 2019-11-30: 30 mmol via INTRAVENOUS
  Filled 2019-11-30: qty 10

## 2019-11-30 MED ORDER — DILTIAZEM HCL 90 MG PO TABS
90.0000 mg | ORAL_TABLET | Freq: Four times a day (QID) | ORAL | Status: DC
  Administered 2019-11-30 – 2019-12-06 (×23): 90 mg
  Filled 2019-11-30 (×24): qty 1

## 2019-11-30 MED ORDER — INSULIN ASPART 100 UNIT/ML ~~LOC~~ SOLN
0.0000 [IU] | SUBCUTANEOUS | Status: DC
  Administered 2019-11-30 (×3): 8 [IU] via SUBCUTANEOUS
  Administered 2019-11-30 – 2019-12-01 (×4): 5 [IU] via SUBCUTANEOUS
  Administered 2019-12-01: 8 [IU] via SUBCUTANEOUS
  Administered 2019-12-01: 5 [IU] via SUBCUTANEOUS
  Administered 2019-12-02 (×2): 8 [IU] via SUBCUTANEOUS
  Administered 2019-12-02: 5 [IU] via SUBCUTANEOUS
  Administered 2019-12-02: 3 [IU] via SUBCUTANEOUS
  Administered 2019-12-02: 5 [IU] via SUBCUTANEOUS
  Administered 2019-12-02 – 2019-12-03 (×2): 3 [IU] via SUBCUTANEOUS
  Administered 2019-12-03: 2 [IU] via SUBCUTANEOUS
  Administered 2019-12-03 (×2): 3 [IU] via SUBCUTANEOUS
  Administered 2019-12-03: 5 [IU] via SUBCUTANEOUS
  Administered 2019-12-03: 3 [IU] via SUBCUTANEOUS
  Administered 2019-12-03: 5 [IU] via SUBCUTANEOUS
  Administered 2019-12-04: 2 [IU] via SUBCUTANEOUS
  Administered 2019-12-04 – 2019-12-05 (×4): 3 [IU] via SUBCUTANEOUS
  Administered 2019-12-05: 2 [IU] via SUBCUTANEOUS
  Administered 2019-12-05: 3 [IU] via SUBCUTANEOUS
  Administered 2019-12-05 (×3): 2 [IU] via SUBCUTANEOUS

## 2019-11-30 MED ORDER — OXYCODONE HCL 5 MG PO TABS
5.0000 mg | ORAL_TABLET | ORAL | Status: DC | PRN
  Administered 2019-11-30 (×2): 5 mg
  Filled 2019-11-30 (×2): qty 1

## 2019-11-30 MED ORDER — ROSUVASTATIN CALCIUM 5 MG PO TABS
5.0000 mg | ORAL_TABLET | Freq: Every day | ORAL | Status: DC
  Administered 2019-12-01 – 2019-12-05 (×5): 5 mg
  Filled 2019-11-30 (×5): qty 1

## 2019-11-30 NOTE — Progress Notes (Signed)
Neurosurgery Service Progress Note  Subjective: No acute events overnight, intermittently answering questions w/ family   Objective: Vitals:   11/30/19 0400 11/30/19 0500 11/30/19 0600 11/30/19 0800  BP: 102/60 (!) 114/57 116/65   Pulse: 82 77 93   Resp: 17 19 (!) 29   Temp: 98.2 F (36.8 C)   98.9 F (37.2 C)  TempSrc: Axillary   Axillary  SpO2: 96% 99% 99%   Weight:        Physical Exam: Eyes open spont, regards, PERRL, gaze conjugate, MAEx4 spont but not FC, no verbal output, groin site intact no palpable hematoma  Assessment & Plan: 79 y.o. woman w/ spontaneous SAH, CTA neg.  -catheter angio next week, on nimodipine -no changes in neurosurgical plan of care  Judith Part  11/30/19 10:09 AM

## 2019-11-30 NOTE — Progress Notes (Signed)
Transcranial Doppler  Date POD PCO2 HCT BP  MCA ACA PCA OPHT SIPH VERT Basilar  11/6 MS/CK     Right  Left   *  *   *  -21   *  *   16  17   38  21   -15  -21   -24           Right  Left                                            Right  Left                                             Right  Left                                             Right  Left                                            Right  Left                                            Right  Left                                        MCA = Middle Cerebral Artery      OPHT = Opthalmic Artery     BASILAR = Basilar Artery   ACA = Anterior Cerebral Artery     SIPH = Carotid Siphon PCA = Posterior Cerebral Artery   VERT = Verterbral Artery                   Normal MCA = 62+\-12 ACA = 50+\-12 PCA = 42+\-23   *Unable to insonate due to technical limitations (poor acoustic windows, patient unable to cooperate).  11/30/2019 12:14 PM Kelby Aline., MHA, RVT, RDCS, RDMS  Haze Rushing, BA, RVS

## 2019-11-30 NOTE — Progress Notes (Signed)
NAME:  Nicole [name], MRN:  [id number], DOB:  1941-01-04, LOS: 3 ADMISSION DATE:  11/26/2019, CONSULTATION DATE:  11/28/2019 REFERRING MD:  Dr. Lupita Leash, CHIEF COMPLAINT:  SAH   Brief History   79 year old from rehab on Eliquis for Afib and acute DVT presenting with fever, confusion, leukocytosis admitted to [facility] with concern for sepsis found on [facility] today with acute SAH.    Past Medical History  HTN, HLD, CAD, Afib chronically on Eliquis (not compliant), adenocarcinoma of the lung, DMT2, OA, bronchitis, headaches   Significant Hospital Events   11/3 admitted to [facility]  Consults:  PCCM NSGY  Procedures:   Significant Diagnostic Tests:  11/3 Vascular LE doppler >>  RIGHT:  - There is no evidence of deep vein thrombosis in the lower extremity.  - No cystic structure found in the popliteal fossa.  LEFT:  - Findings consistent with acute deep vein thrombosis involving the left  common femoral vein, SF junction, left femoral vein, left proximal  profunda vein, left popliteal vein, left posterior tibial veins, and left  peroneal veins.  - No cystic structure found in the popliteal fossa.   11/3 CT chest  >> 1. Constellation of findings likely reflect pulmonary edema versus atypical infection. 2. New small to moderate LEFT pleural effusion. 3. New LEFT and mediastinal supraclavicular lymphadenopathy, nonspecific and possibly reactive in etiology. Recommend attention on follow-up. 4. New 7 mm RIGHT apical nodular versus consolidative opacity. Non-contrast chest CT at 6-12 months is recommended. If the nodule is stable at time of repeat CT, then future CT at 18-24 months (from today's scan) 5. Increased conspicuity of a RIGHT thyroid nodule in comparison to priors. Recommend nonemergent evaluation with dedicated thyroid ultrasound.  11/4 CTH >> Large amount of subarachnoid hemorrhage is noted throughout the brain, most prominently seen in the basal cisterns and left anterior hemispheric fissure  and left Sylvian fissure. Blood is also noted in the posterior horns of both lateral ventricles. Hemorrhage is also noted in the fourth ventricle. These findings are concerning for possible rupture of cerebral aneurysm. Further evaluation with MRA or CTA is recommended.  11/4 CTA head/ neck >> No aneurysm identified. Remains highly suspicious for ruptured aneurysm and catheter angiography is recommended.  Micro Data:  11/3 SARS2 / flu  >> neg 11/3 UC >> multiple species 11/3 BCx2 >> 2/4 methicillin resistant stap epi  Antimicrobials:  11/3 cefepime >> 11/5 11/3 azithromycin >> 11/5 ceftriaxone  Interim history/subjective:  Patient is a little bit patient is status post IVC filter placement by IR yesterday, she is lethargic but arousable Cortrak was placed yesterday Objective   Blood pressure 116/65, pulse 93, temperature 98.9 F (37.2 C), temperature source Axillary, resp. rate (!) 29, weight 76.1 kg, SpO2 99 %.        Intake/Output Summary (Last 24 hours) at 11/30/2019 1024 Last data filed at 11/30/2019 0600 Gross per 24 hour  Intake 2367.01 ml  Output 250 ml  Net 2117.01 ml   Filed Weights   12/10/2019 2020  Weight: 76.1 kg   Examination: General:  Chronically ill appearing elderly female lying in bed, looks lethargic HEENT: MM pink/mucous membrane looks dry, pupils 3/non reactive Neuro: Opens eyes to verbal stimuli, following simple commands intermittently, antigravity bilateral upper extremities, lower extremities with minimal movement bilaterally  CV: Irregularly irregular, no murmur or gallop PULM:  Non labored, scattered rales on right, clear on left, diminished in bases GI: soft, +bs Extremities: warm/dry, 3+ LE edema  Skin:  no rashes, left lower extremities reddish, warm to touch  Resolved Hospital Problem list   AKI Non-anion gap metabolic acidosis Assessment & Plan:  Atraumatic subarachnoid hemorrhage, likely in the setting of anticoagulation, s/p Kcentra CT  angiogram of head showed no aneurysm Neurosurgery input is appreciated, patient will have cerebral angiogram next week Continue neurochecks every hour Continue nimodipine Maintain systolic blood pressure 563 - 140 millimeters mercury  Atrial fibrillation with rapid ventricular response Patient's heart rate is better controlled now Diltiazem infusion was stopped Increase oral diltiazem to 90 mg every 6 hours Holding off on anticoagulation due to subarachnoid hemorrhage  Sepsis/ SIRS of unclear etiology Overall patient remained afebrile Culture data is negative so far We will continue ceftriaxone azithromycin to complete 5 days of therapy empirically  CT chest likely pulmonary edema vs atypical infection, and new left and mediastinal supraclavicular lymphadenopathy, new 51mm right apical nodule vs consolidative opacity; also noted to have increased conspicuity of right thyroid nodule; will need further workup for these nodules given her hx of lung adenocarcinoma  Acute left lower extremity DVT Due to subarachnoid hemorrhage, patient cannot be started on anticoagulation Patient had IVC filter placed yesterday  Hypokalemia, hypomagnesemia, hypophosphatemia Patient serum potassium improved to 3.6, has low mag and Phos Continue aggressive electrolyte supplements  DMT2 with hyperglycemia Change sensitivity scale to moderate and added tube feed coverage   Best practice:  Diet: NPO, tube feed Pain/Anxiety/Delirium protocol (if indicated): n/a VAP protocol (if indicated): n/a DVT prophylaxis: Holding off due to subarachnoid hemorrhage GI prophylaxis:n/a Glucose control: Sliding scale Mobility: Out of bed to chair Code Status: Full code Family Communication: Daughter updated at bedside Disposition: ICU  Labs   CBC: Recent Labs  Lab 12/18/2019 0954 11/28/19 0137 11/28/19 1235 12/24/2019 0023 11/30/19 0443  WBC 13.7* 12.6* 12.9* 10.1 10.1  NEUTROABS 11.6*  --   --   --   --   HGB  12.2 12.1 11.4* 12.1 10.3*  HCT 39.1 36.6 34.8* 37.3 32.1*  MCV 96.5 93.4 92.6 94.7 94.4  PLT 300 293 271 290 875    Basic Metabolic Panel: Recent Labs  Lab 12/20/2019 0954 11/28/19 0137 11/28/19 1320 12/13/2019 0023 12/19/2019 1533 11/30/19 0443  NA 134* 134* 135 138  --  137  K 4.5 4.1 3.3* 2.7*  --  3.6  CL 98 99 100 99  --  104  CO2 16* 24 24 27   --  24  GLUCOSE 261* 207* 205* 190*  --  266*  BUN 30* 31* 27* 19  --  18  CREATININE 1.63* 1.09* 0.93 0.73  --  0.67  CALCIUM 8.9 9.3 8.7* 9.3  --  8.9  MG  --   --   --   --  1.7 1.6*  PHOS  --   --   --   --  1.7* 1.9*   GFR: Estimated Creatinine Clearance: 61.4 mL/min (by C-G formula based on SCr of 0.67 mg/dL). Recent Labs  Lab 12/03/2019 0954 12/16/2019 0954 11/26/2019 1353 11/28/19 0137 11/28/19 1235 12/23/2019 0023 11/30/19 0443  WBC 13.7*   < >  --  12.6* 12.9* 10.1 10.1  LATICACIDVEN 2.7*  --  2.2*  --   --   --   --    < > = values in this interval not displayed.    Liver Function Tests: Recent Labs  Lab 12/22/2019 0954 11/28/19 0137 12/16/2019 0023 11/30/19 0443  AST 21 18 17 18   ALT 12 13 12  14  ALKPHOS 63 65 58 56  BILITOT 1.6* 0.7 1.1 0.6  PROT 5.9* 6.2* 5.8* 5.0*  ALBUMIN 2.6* 2.7* 2.5* 2.1*   No results for input(s): LIPASE, AMYLASE in the last 168 hours. No results for input(s): AMMONIA in the last 168 hours.  ABG    Component Value Date/Time   HCO3 20.6 12/15/2019 2024   ACIDBASEDEF 2.7 (H) 12/23/2019 2024   O2SAT 86.3 12/14/2019 2024     Coagulation Profile: Recent Labs  Lab 12/10/2019 0954 11/28/19 1320 11/26/2019 0023  INR 1.3* 1.7* 1.3*    Cardiac Enzymes: Recent Labs  Lab 12/18/2019 2024  CKTOTAL 74    HbA1C: Hemoglobin A1C  Date/Time Value Ref Range Status  06/17/2019 04:58 PM 8.8 (A) 4.0 - 5.6 % Final   Hgb A1c MFr Bld  Date/Time Value Ref Range Status  12/14/2019 08:24 PM 6.4 (H) 4.8 - 5.6 % Final    Comment:    (NOTE) Pre diabetes:          5.7%-6.4%  Diabetes:               >6.4%  Glycemic control for   <7.0% adults with diabetes   04/04/2019 05:33 AM 8.6 (H) 4.8 - 5.6 % Final    Comment:    (NOTE) Pre diabetes:          5.7%-6.4% Diabetes:              >6.4% Glycemic control for   <7.0% adults with diabetes     CBG: Recent Labs  Lab 12/06/2019 1627 12/22/2019 1924 11/30/19 0028 11/30/19 0312 11/30/19 0807  GLUCAP 198* 199* 216* 240* 247*    Review of Systems:   Unable   Past Medical History  She,  has a past medical history of A-fib ([place]), Adenocarcinoma of right lung, stage 3 ([place]) (07/28/2016), Atrial flutter ([place]) (10/05/2016), Bronchitis, Cancer ([place]) (2004), Cancer-related pain (10/06/2016), Coronary artery disease (02/21/2017), Diabetes mellitus, Edema (07/12/2017), Essential hypertension (01/20/2009), Gallstones, Headache, History of radiation therapy (08/09/2016 to 09/19/2016), Hypertension, Hypertriglyceridemia (08/22/2012), Low back pain, Lung mass, Osteoarthritis, and Type II diabetes mellitus with manifestations ([place]) (01/20/2009).   Surgical History    Past Surgical History:  Procedure Laterality Date  . ABDOMINAL HYSTERECTOMY  2004  . APPENDECTOMY  when 79 years old  . COLONOSCOPY  08-05-09   [name]  . I & D EXTREMITY Right 04/04/2019   Procedure: IRRIGATION AND DEBRIDEMENT RIGHT HAND;  Surgeon: Verner Mould, MD;  Location: [facility];  Service: Orthopedics;  Laterality: Right;  . INCONTINENCE SURGERY    . IR IVC FILTER PLMT / S&I /IMG GUID/MOD SED  12/24/2019  . IR KYPHO EA ADDL LEVEL THORACIC OR LUMBAR  11/22/2019  . IR KYPHO LUMBAR INC FX REDUCE BONE BX UNI/BIL CANNULATION INC/IMAGING  11/22/2019  . IR RADIOLOGIST EVAL & MGMT  11/13/2019  . KNEE ARTHROSCOPY Left   . LEFT HEART CATH AND CORONARY ANGIOGRAPHY N/A 02/24/2017   Procedure: LEFT HEART CATH AND CORONARY ANGIOGRAPHY;  Surgeon: Jettie Booze, MD;  Location: [facility];  Service: Cardiovascular;  Laterality: N/A;  . LEFT HEART CATHETERIZATION WITH CORONARY  ANGIOGRAM N/A 07/19/2013   Procedure: LEFT HEART CATHETERIZATION WITH CORONARY ANGIOGRAM;  Surgeon: Sinclair Grooms, MD;  Location: [facility] CATH LAB;  Service: Cardiovascular;  Laterality: N/A;  . POLYPECTOMY  08-05-09   2 polyps  . TOTAL KNEE ARTHROPLASTY Left 03/28/2012   Procedure: LEFT TOTAL KNEE ARTHROPLASTY;  Surgeon: Tobi Bastos, MD;  Location: [facility];  Service: Orthopedics;  Laterality: Left;  . TUBAL LIGATION    . VIDEO BRONCHOSCOPY WITH ENDOBRONCHIAL ULTRASOUND N/A 07/11/2016   Procedure: VIDEO BRONCHOSCOPY WITH ENDOBRONCHIAL ULTRASOUND;  Surgeon: Marshell Garfinkel, MD;  Location: [facility];  Service: Pulmonary;  Laterality: N/A;     Social History   reports that she has never smoked. She has never used smokeless tobacco. She reports that she does not drink alcohol and does not use drugs.   Family History   Her family history includes Arthritis in an other family member; Cancer in an other family member; Diabetes in an other family member; Heart attack in her father; Heart disease in her father; Hyperlipidemia in an other family member; Hypertension in an other family member. There is no history of Colon cancer.   Allergies No Known Allergies   Home Medications  Prior to Admission medications   Medication Sig Start Date End Date Taking? Authorizing Provider  acetaminophen (TYLENOL) 325 MG tablet Take 650 mg by mouth every 6 (six) hours as needed for headache.   Yes [provider]  b complex vitamins tablet Take 1 tablet by mouth daily.   Yes [provider]  CALCIUM-VITAMIN D-VITAMIN K PO Take 2 tablets by mouth daily with breakfast. Calcium 500 mg vitamin d 200 unit vitamin k 40 mg    Yes [provider]  Collagenase POWD Apply 1 application topically daily as needed (skin care).    Yes [provider]  diltiazem (CARDIZEM CD) 360 MG 24 hr capsule TAKE 1 CAPSULE(360 MG) BY MOUTH DAILY Patient taking differently: Take 360 mg by mouth daily.  07/09/19   Yes Jettie Booze, MD  docusate sodium (COLACE) 100 MG capsule Take 100 mg by mouth daily.   Yes [provider]  ELIQUIS 5 MG TABS tablet Take 5 mg by mouth 2 (two) times daily.  09/25/19  Yes [provider]  insulin glargine, 2 Unit Dial, (TOUJEO MAX SOLOSTAR) 300 UNIT/ML Solostar Pen Inject 30 Units into the skin daily. Patient taking differently: Inject 20 Units into the skin daily.  10/14/19  Yes Burns, Claudina Lick, MD  LACTOBACILLUS PO Take 1 capsule by mouth daily. 3 billion cell   Yes [provider]  Lidocaine 4 % PTCH Apply 1 patch topically daily. Apply 1 patch in the morning, remove at night.   Yes [provider]  Lidocaine-Menthol 4-1 % PTCH Apply 1 patch topically daily. Apply to lower back   Yes [provider]  metFORMIN (GLUCOPHAGE) 500 MG tablet Take 1,000 mg by mouth daily with breakfast.   Yes [provider]  methocarbamol (ROBAXIN) 500 MG tablet Take 500 mg by mouth daily.   Yes [provider]  metoprolol tartrate (LOPRESSOR) 25 MG tablet Take 1 tablet every 8 hours AS NEEDED for rapid heart rate Patient taking differently: Take 25 mg by mouth every 8 (eight) hours as needed (hypertention).  08/01/18  Yes Fenton, Clint R, PA  Multiple Vitamins-Minerals (WOMENS MULTIVITAMIN PO) Take 1 tablet by mouth daily.   Yes [provider]  Omega-3 Fatty Acids (OMEGA III EPA+DHA PO) Take 1 capsule by mouth daily. Fish oil 1200 mg, dha 144 mg, epa 216 mg    Yes [provider]  rosuvastatin (CRESTOR) 5 MG tablet TAKE 1 TABLET(5 MG) BY MOUTH DAILY Patient taking differently: TAKE 1 TABLET(5 MG) BY MOUTH DAILY 10/07/19  Yes [name], MD  torsemide (DEMADEX) 20 MG tablet Take 1 tablet (20  mg total) by mouth every other day. Patient taking differently: Take 20 mg by mouth daily.  08/28/19  Yes Fenton, Clint R, PA  traMADol (ULTRAM) 50 MG tablet Take 25 mg by mouth 2 (two) times daily as needed for moderate  pain.   Yes [provider]  witch hazel-glycerin (TUCKS) pad Apply 1 application topically 3 (three) times daily.   Yes [provider]  CONTOUR NEXT TEST test strip USE 1 STRIP TO CHECK GLUCOSE TWICE DAILY 12/25/18   [name], MD  glucose blood test strip Contour Next Test Strips  USE 1 STRIP TO CHECK GLUCOSE TWICE DAILY    [provider]  Insulin Pen Needle (NOVOFINE) 32G X 6 MM MISC 1 Act by Does not apply route daily. 06/17/19   [name], MD  Lancets (ACCU-CHEK SOFT [facility]) lancets Use to check blood sugars daily Dx E11.9 06/18/19   [name], MD  irbesartan (AVAPRO) 300 MG tablet Take 1 tablet (300 mg total) by mouth daily. 12/12/18   [name], MD     Total critical care time: 42 minutes  Performed by: [place] care time was exclusive of separately billable procedures and treating other patients.   Critical care was necessary to treat or prevent imminent or life-threatening deterioration.   Critical care was time spent personally by me on the following activities: development of treatment plan with patient and/or surrogate as well as nursing, discussions with consultants, evaluation of patient's response to treatment, examination of patient, obtaining history from patient or surrogate, ordering and performing treatments and interventions, ordering and review of laboratory studies, ordering and review of radiographic studies, pulse oximetry and re-evaluation of patient's condition.   Jacky Kindle MD Critical care physician [facility]  Pager: [phone] Mobile: [phone]

## 2019-11-30 NOTE — TOC Initial Note (Cosign Needed)
Transition of Care [facility]) - Initial/Assessment Note    Patient Details  Name: Nicole [name] MRN: [id number] Date of Birth: 1940-06-13  [facility] CM/SW Contact:    Andria Frames, [place] Work Phone Number: 11/30/2019, 12:17 PM  Clinical Narrative:              MSW Intern contacted [facility]. She advised that patient is no longer a resident at their facility; that patient's daughter came to the facility and packed up all of her belongings and did not request a bed hold.   Follow recommendations after medical work up is complete.   TOC will continue to follow for further discharge supports.             Patient Goals and CMS Choice        Expected Discharge Plan and Services                                                Prior Living Arrangements/Services                       Activities of Daily Living      Permission Sought/Granted                  Emotional Assessment              Admission diagnosis:  Sepsis ([place]) [A41.9] Sepsis with acute organ dysfunction without septic shock, due to unspecified organism, unspecified type ([place]) [A41.9, R65.20] Patient Active Problem List   Diagnosis Date Noted  . Menopausal symptom 12/01/2019  . Pressure injury of skin 11/28/2019  . Sepsis ([place]) 12/19/2019  . Persistent atrial fibrillation ([place]) 08/21/2019  . Senile purpura ([place]) 08/03/2019  . Decubitus ulcer of sacral region, stage 1 05/08/2019  . Diastolic dysfunction with chronic heart failure ([place]) 04/30/2019  . Encounter for orthopedic follow-up care 04/09/2019  . Abscess of hand 04/08/2019  . Paroxysmal atrial fibrillation ([place]) 02/20/2019  . Secondary hypercoagulable state ([place]) 02/20/2019  . External hemorrhoids 08/16/2018  . Hyperlipidemia with target LDL less than 100 07/18/2018  . Atherosclerosis of aorta ([place]) 07/18/2018  . Spinal stenosis at L4-L5 level 01/25/2018  . Arthritis of  sacroiliac joint 01/11/2018  . Coronary artery disease 02/21/2017  . Pneumonitis, radiation ([place]) 01/26/2017  . Encounter for antineoplastic immunotherapy 10/12/2016  . Cancer-related pain 10/06/2016  . Atrial flutter ([place]) 10/05/2016  . Adenocarcinoma of right lung, stage 3 ([place]) 07/28/2016  . Encounter for antineoplastic chemotherapy 07/28/2016  . Pancreatic cyst 11/09/2015  . Routine general medical examination at a health care facility 06/30/2014  . Gout 01/18/2013  . Hypertriglyceridemia 08/22/2012  . Osteopenia 10/31/2011  . Type II diabetes mellitus with manifestations ([place]) 01/20/2009  . Essential hypertension 01/20/2009  . Osteoarthritis 01/20/2009  . Endometrial carcinoma ([place]) 01/24/2002   PCP:  [name], MD Pharmacy:   [facility] [place], [place] - [address] [place] [address] Phone: [phone] Fax: [facility] [address] [place] - [place] [address] [place] [address] Phone: [phone] Fax: [phone]     Social Determinants of Health (SDOH) Interventions    Readmission Risk Interventions No flowsheet data found.

## 2019-11-30 NOTE — Evaluation (Signed)
Physical Therapy Evaluation Patient Details Name: Nicole [name] MRN: [id number] DOB: February 03, 1940 Today's Date: 11/30/2019   History of Present Illness  The pt is a 79 yo female presenting with confusion/deliruim. Upon admission, found to have pneumonia and imaging revealed Moderate volume subarachnoid hemorrhage mostly in basal cisterns and left anterior hemispheric fissure. PMH includes: afib, adenocarcinoma stage 3, CAD, DM II, HTN, and back pain.  Cortrak and IVC filter placed 11/5   Clinical Impression  Pt admitted with/for confusion/delirium. Found to have [place] and sepsis due to ?PNA.  Pt needing max to total assist for limited mobility at this time..  Pt currently limited functionally due to the problems listed. ( See problems list.)   Pt will benefit from PT to maximize function and safety in order to get ready for next venue listed below.     Follow Up Recommendations SNF;Supervision/Assistance - 24 hour    Equipment Recommendations  None recommended by PT    Recommendations for Other Services       Precautions / Restrictions Precautions Precautions: Fall      Mobility  Bed Mobility Overal bed mobility: Needs Assistance Bed Mobility: Rolling;Sidelying to Sit;Sit to Sidelying Rolling: Total assist Sidelying to sit: Total assist;Max assist     Sit to sidelying: Total assist;+2 for physical assistance General bed mobility comments: pt not focused enough to help much and what little assist is not coordinated.      Transfers                 General transfer comment: NT today  Ambulation/Gait             General Gait Details: not able today  Stairs            Wheelchair Mobility    Modified Rankin (Stroke Patients Only)       Balance Overall balance assessment: Needs assistance Sitting-balance support: Single extremity supported;No upper extremity supported;Feet supported;Feet unsupported Sitting balance-Leahy Scale: Poor Sitting balance -  Comments: pt unable to maintain balance without external assist.  Sat for ~5 min, pt progressively more fidgety, pushing into extension.                                     Pertinent Vitals/Pain Pain Assessment: Faces Faces Pain Scale: Hurts even more Pain Location: Right hip/?right flank Pain Descriptors / Indicators: Discomfort;Grimacing;Guarding (crying out) Pain Intervention(s): Monitored during session;Limited activity within patient's tolerance;Premedicated before session;Repositioned    Home Living Family/patient expects to be discharged to:: Private residence Living Arrangements: Alone Available Help at Discharge: Family;Available PRN/intermittently Type of Home: House Home Access: Stairs to enter Entrance Stairs-Rails: Right;Left;Can reach both Entrance Stairs-Number of Steps: 5 Home Layout: One level Home Equipment: Walker - 2 wheels;Cane - single point;Bedside commode;Shower seat;Wheelchair - manual      Prior Function Level of Independence: Independent with assistive device(s) (This was prior to a month or more ago.)               Hand Dominance   Dominant Hand: Right    Extremity/Trunk Assessment   Upper Extremity Assessment Upper Extremity Assessment: Generalized weakness;Difficult to assess due to impaired cognition    Lower Extremity Assessment Lower Extremity Assessment: Generalized weakness;Difficult to assess due to impaired cognition       Communication   Communication: Other (comment) (too ?encephalopathic to communicate.)  Cognition Arousal/Alertness: Lethargic Behavior During Therapy: [facility] for tasks assessed/performed  Overall Cognitive Status: Impaired/Different from baseline Area of Impairment: Orientation;Attention;Memory;Following commands;Safety/judgement;Awareness;Problem solving                 Orientation Level: Situation;Time;Place Current Attention Level: Focused   Following Commands: Follows one step  commands inconsistently;Follows one step commands with increased time Safety/Judgement: Decreased awareness of safety Awareness: Intellectual Problem Solving: Slow processing General Comments: Encephalopathic/confused due to sepsis      General Comments General comments (skin integrity, edema, etc.): vss overall    Exercises Other Exercises Other Exercises: Warm up U and LE ROM bil   Assessment/Plan    PT Assessment Patient needs continued PT services  PT Problem List Decreased strength;Decreased activity tolerance;Decreased balance;Decreased mobility;Decreased coordination;Pain       PT Treatment Interventions DME instruction;Gait training;Functional mobility training;Therapeutic activities;Balance training;Patient/family education    PT Goals (Current goals can be found in the Care Plan section)  Acute Rehab PT Goals PT Goal Formulation: With patient/family Time For Goal Achievement: 12/14/19 Potential to Achieve Goals: Good    Frequency Min 3X/week   Barriers to discharge        Co-evaluation               AM-PAC PT "6 Clicks" Mobility  Outcome Measure Help needed turning from your back to your side while in a flat bed without using bedrails?: Total Help needed moving from lying on your back to sitting on the side of a flat bed without using bedrails?: Total Help needed moving to and from a bed to a chair (including a wheelchair)?: Total Help needed standing up from a chair using your arms (e.g., wheelchair or bedside chair)?: Total Help needed to walk in hospital room?: Total Help needed climbing 3-5 steps with a railing? : Total 6 Click Score: 6    End of Session   Activity Tolerance: Patient tolerated treatment well;Patient limited by pain Patient left: in bed;with call bell/phone within reach;with bed alarm set;with family/visitor present;with nursing/sitter in room Nurse Communication: Mobility status PT Visit Diagnosis: Other abnormalities of gait  and mobility (R26.89);Muscle weakness (generalized) (M62.81);Other symptoms and signs involving the nervous system (R29.898);Pain Pain - Right/Left: Right Pain - part of body: Hip (flank)    Time: 1020-1051 PT Time Calculation (min) (ACUTE ONLY): 31 min   Charges:   PT Evaluation $PT Eval Moderate Complexity: 1 Mod PT Treatments $Therapeutic Activity: 8-22 mins        11/30/2019  [name], PT Acute Rehabilitation Services [phone]  (pager) [phone]  (office)  Tessie Fass Calogero Geisen 11/30/2019, 11:06 AM

## 2019-12-01 DIAGNOSIS — I609 Nontraumatic subarachnoid hemorrhage, unspecified: Secondary | ICD-10-CM | POA: Diagnosis not present

## 2019-12-01 DIAGNOSIS — I4891 Unspecified atrial fibrillation: Secondary | ICD-10-CM

## 2019-12-01 LAB — CBC
HCT: 34.7 % — ABNORMAL LOW (ref 36.0–46.0)
Hemoglobin: 11.2 g/dL — ABNORMAL LOW (ref 12.0–15.0)
MCH: 30.4 pg (ref 26.0–34.0)
MCHC: 32.3 g/dL (ref 30.0–36.0)
MCV: 94 fL (ref 80.0–100.0)
Platelets: 358 10*3/uL (ref 150–400)
RBC: 3.69 MIL/uL — ABNORMAL LOW (ref 3.87–5.11)
RDW: 14.6 % (ref 11.5–15.5)
WBC: 12 10*3/uL — ABNORMAL HIGH (ref 4.0–10.5)
nRBC: 0 % (ref 0.0–0.2)

## 2019-12-01 LAB — COMPREHENSIVE METABOLIC PANEL
ALT: 14 U/L (ref 0–44)
AST: 31 U/L (ref 15–41)
Albumin: 2.2 g/dL — ABNORMAL LOW (ref 3.5–5.0)
Alkaline Phosphatase: 71 U/L (ref 38–126)
Anion gap: 8 (ref 5–15)
BUN: 22 mg/dL (ref 8–23)
CO2: 24 mmol/L (ref 22–32)
Calcium: 9.1 mg/dL (ref 8.9–10.3)
Chloride: 106 mmol/L (ref 98–111)
Creatinine, Ser: 0.68 mg/dL (ref 0.44–1.00)
GFR, Estimated: >60 mL/min (ref >60)
Glucose, Bld: 267 mg/dL — ABNORMAL HIGH (ref 70–99)
Potassium: 4.5 mmol/L (ref 3.5–5.1)
Sodium: 138 mmol/L (ref 135–145)
Total Bilirubin: 0.6 mg/dL (ref 0.3–1.2)
Total Protein: 5.5 g/dL — ABNORMAL LOW (ref 6.5–8.1)

## 2019-12-01 LAB — GLUCOSE, CAPILLARY
Glucose-Capillary: 244 mg/dL — ABNORMAL HIGH (ref 70–99)
Glucose-Capillary: 207 mg/dL — ABNORMAL HIGH (ref 70–99)
Glucose-Capillary: 202 mg/dL — ABNORMAL HIGH (ref 70–99)
Glucose-Capillary: 210 mg/dL — ABNORMAL HIGH (ref 70–99)
Glucose-Capillary: 274 mg/dL — ABNORMAL HIGH (ref 70–99)

## 2019-12-01 LAB — MAGNESIUM: Magnesium: 2.3 mg/dL (ref 1.7–2.4)

## 2019-12-01 LAB — PHOSPHORUS: Phosphorus: 2.6 mg/dL (ref 2.5–4.6)

## 2019-12-01 LAB — T3, FREE: T3, Free: 2.2 pg/mL (ref 2.0–4.4)

## 2019-12-01 MED ORDER — INSULIN GLARGINE 100 UNIT/ML ~~LOC~~ SOLN
10.0000 [IU] | Freq: Every day | SUBCUTANEOUS | Status: DC
  Administered 2019-12-01: 10 [IU] via SUBCUTANEOUS
  Filled 2019-12-01 (×2): qty 0.1

## 2019-12-01 MED ORDER — TRAMADOL HCL 50 MG PO TABS
50.0000 mg | ORAL_TABLET | Freq: Four times a day (QID) | ORAL | Status: AC | PRN
  Administered 2019-12-01 – 2019-12-02 (×5): 50 mg
  Filled 2019-12-01 (×5): qty 1

## 2019-12-01 MED ORDER — TRAMADOL HCL 50 MG PO TABS: 50.0000 mg | ORAL_TABLET | Freq: Four times a day (QID) | ORAL | Status: DC | PRN

## 2019-12-01 NOTE — Progress Notes (Signed)
NAME:  Nicole [name], MRN:  [id number], DOB:  Jun 26, 1940, LOS: 4 ADMISSION DATE:  12/02/2019, CONSULTATION DATE:  11/28/2019 REFERRING MD:  Dr. Lupita Leash, CHIEF COMPLAINT:  SAH   Brief History   79 year old from rehab on Eliquis for Afib and acute DVT presenting with fever, confusion, leukocytosis admitted to [facility] with concern for sepsis found on [facility] today with acute SAH.    Past Medical History  HTN, HLD, CAD, Afib chronically on Eliquis (not compliant), adenocarcinoma of the lung, DMT2, OA, bronchitis, headaches   Significant Hospital Events   11/3 admitted to [facility]  Consults:  PCCM NSGY  Procedures:   Significant Diagnostic Tests:  11/3 Vascular LE doppler >>  RIGHT:  - There is no evidence of deep vein thrombosis in the lower extremity.  - No cystic structure found in the popliteal fossa.  LEFT:  - Findings consistent with acute deep vein thrombosis involving the left  common femoral vein, SF junction, left femoral vein, left proximal  profunda vein, left popliteal vein, left posterior tibial veins, and left  peroneal veins.  - No cystic structure found in the popliteal fossa.   11/3 CT chest  >> 1. Constellation of findings likely reflect pulmonary edema versus atypical infection. 2. New small to moderate LEFT pleural effusion. 3. New LEFT and mediastinal supraclavicular lymphadenopathy, nonspecific and possibly reactive in etiology. Recommend attention on follow-up. 4. New 7 mm RIGHT apical nodular versus consolidative opacity. Non-contrast chest CT at 6-12 months is recommended. If the nodule is stable at time of repeat CT, then future CT at 18-24 months (from today's scan) 5. Increased conspicuity of a RIGHT thyroid nodule in comparison to priors. Recommend nonemergent evaluation with dedicated thyroid ultrasound.  11/4 CTH >> Large amount of subarachnoid hemorrhage is noted throughout the brain, most prominently seen in the basal cisterns and left anterior hemispheric fissure  and left Sylvian fissure. Blood is also noted in the posterior horns of both lateral ventricles. Hemorrhage is also noted in the fourth ventricle. These findings are concerning for possible rupture of cerebral aneurysm. Further evaluation with MRA or CTA is recommended.  11/4 CTA head/ neck >> No aneurysm identified. Remains highly suspicious for ruptured aneurysm and catheter angiography is recommended.  Micro Data:  11/3 SARS2 / flu  >> neg 11/3 UC >> multiple species 11/3 BCx2 >> 2/4 methicillin resistant stap epi  Antimicrobials:  11/3 cefepime >> 11/5 11/3 azithromycin >> 11/5 ceftriaxone  Interim history/subjective:  NAEO  Continuing to find the correct balance of analgesic management   Will try low dose tramadol in lieu of oxy   Objective   Blood pressure 104/62, pulse 90, temperature 99 F (37.2 C), temperature source Axillary, resp. rate (!) 30, weight 76.1 kg, SpO2 98 %.        Intake/Output Summary (Last 24 hours) at 12/01/2019 0745 Last data filed at 12/01/2019 0600 Gross per 24 hour  Intake 3274.91 ml  Output 600 ml  Net 2674.91 ml   Filed Weights   12/05/2019 2020  Weight: 76.1 kg   Examination: General:  Chronically ill appearing older adult F, reclined in bed NAD with family at bedside  HEENT: NCAT Cortrak secure. Anicteric sclera  Neuro: Awake, drowsy but more alert to family's voice. Moving spontaneously.  CV: irreg rhythm. s1s2 no rgm 2+ radial pulses  PULM: Symmetrical chest expansion, even unlabored respirations. Diminished R base, clear L  GI: soft round ndnt normoactive  Extremities: no obvious joint deformity. No cyanosis or  clubbing. BLE edema  Skin: c/d/w. Scattered ecchymosis   Resolved Hospital Problem list   AKI Non-anion gap metabolic acidosis Assessment & Plan:   Ataumatic subarachnoid hemorrhage-- likely in setting of anticoagulation, s/p kcentra CT angiogram of head showed no aneurysm P -NSGY following -- plan for cerebral  angiogram in coming days  -continue q1hr neuro checks -nimodipine -SBP goal 100-140  Afib RVR Patient's heart rate is better controlled now Diltiazem infusion was stopped P -Dilt 90mg  q6hr PO -holing anticoagulation in setting of SAH above  SIRS unclear etiology Cx data unremarkable (1x Bx with staph epidermis, likely contaminant). Afebrile, fairly stable WBC.  -CT chest 11/3 with pulm edema vs infection P -complete 5 days of empiric abx (azithromycin and ceftriaxone)  -trend fever curve, WBC   Acute DVT of LLE P -s/p IVC filter placement  -not a candidate for anticoagulation in setting of SAH   Hypokalemia, improved Hypomagnesemia, improved Hypophosphatemia, improved  S/p replacement P -continue to trend electrolytes and replete as needed -goal K 4, goal mag 2 in setting of Afib   DMT2 with hyperglycemia P -SSI + EN coverage. Adding 10 units lantus. -DM coordinatorconsult  L mediastinal supraclavicular lymphadenopathy  R apical nodule vs consolidation, 70mm R thyroid nodule  Hx adenocarcinoma of lung  P -will need further outpt evaluation when acute illness course is improved, with repeat CT chest and thyroid ultrasound   Best practice:  Diet: EN  Pain/Anxiety/Delirium protocol (if indicated): PRN tramadol  VAP protocol (if indicated): n/a DVT prophylaxis: Holding off due to subarachnoid hemorrhage GI prophylaxis:n/a Glucose control: Sliding scale Mobility: Out of bed to chair Code Status: Full code Family Communication: Son at bedside, all questions answered  Disposition: ICU  Labs   CBC: Recent Labs  Lab 11/26/2019 0954 12/14/2019 0954 11/28/19 0137 11/28/19 1235 12/15/2019 0023 11/30/19 0443 12/01/19 0357  WBC 13.7*   < > 12.6* 12.9* 10.1 10.1 12.0*  NEUTROABS 11.6*  --   --   --   --   --   --   HGB 12.2   < > 12.1 11.4* 12.1 10.3* 11.2*  HCT 39.1   < > 36.6 34.8* 37.3 32.1* 34.7*  MCV 96.5   < > 93.4 92.6 94.7 94.4 94.0  PLT 300   < > 293 271  290 276 358   < > = values in this interval not displayed.    Basic Metabolic Panel: Recent Labs  Lab 11/28/19 0137 11/28/19 1320 12/11/2019 0023 11/30/2019 1533 11/30/19 0443 11/30/19 1652 12/01/19 0357  NA 134* 135 138  --  137  --  138  K 4.1 3.3* 2.7*  --  3.6  --  4.5  CL 99 100 99  --  104  --  106  CO2 24 24 27   --  24  --  24  GLUCOSE 207* 205* 190*  --  266*  --  267*  BUN 31* 27* 19  --  18  --  22  CREATININE 1.09* 0.93 0.73  --  0.67  --  0.68  CALCIUM 9.3 8.7* 9.3  --  8.9  --  9.1  MG  --   --   --  1.7 1.6* 2.3 2.3  PHOS  --   --   --  1.7* 1.9* 2.3* 2.6   GFR: Estimated Creatinine Clearance: 61.4 mL/min (by C-G formula based on SCr of 0.68 mg/dL). Recent Labs  Lab 12/04/2019 0954 12/01/2019 1353 11/28/19 0137 11/28/19 1235 11/28/2019 0023 11/30/19  7035 12/01/19 0357  WBC 13.7*  --    < > 12.9* 10.1 10.1 12.0*  LATICACIDVEN 2.7* 2.2*  --   --   --   --   --    < > = values in this interval not displayed.    Liver Function Tests: Recent Labs  Lab 12/10/2019 0954 11/28/19 0137 11/25/2019 0023 11/30/19 0443 12/01/19 0357  AST 21 18 17 18 31   ALT 12 13 12 14 14   ALKPHOS 63 65 58 56 71  BILITOT 1.6* 0.7 1.1 0.6 0.6  PROT 5.9* 6.2* 5.8* 5.0* 5.5*  ALBUMIN 2.6* 2.7* 2.5* 2.1* 2.2*   No results for input(s): LIPASE, AMYLASE in the last 168 hours. No results for input(s): AMMONIA in the last 168 hours.  ABG    Component Value Date/Time   HCO3 20.6 12/21/2019 2024   ACIDBASEDEF 2.7 (H) 12/13/2019 2024   O2SAT 86.3 12/13/2019 2024     Coagulation Profile: Recent Labs  Lab 11/28/2019 0954 11/28/19 1320 12/15/2019 0023  INR 1.3* 1.7* 1.3*    Cardiac Enzymes: Recent Labs  Lab 11/26/2019 2024  CKTOTAL 74    HbA1C: Hemoglobin A1C  Date/Time Value Ref Range Status  06/17/2019 04:58 PM 8.8 (A) 4.0 - 5.6 % Final   Hgb A1c MFr Bld  Date/Time Value Ref Range Status  11/26/2019 08:24 PM 6.4 (H) 4.8 - 5.6 % Final    Comment:    (NOTE) Pre diabetes:           5.7%-6.4%  Diabetes:              >6.4%  Glycemic control for   <7.0% adults with diabetes   04/04/2019 05:33 AM 8.6 (H) 4.8 - 5.6 % Final    Comment:    (NOTE) Pre diabetes:          5.7%-6.4% Diabetes:              >6.4% Glycemic control for   <7.0% adults with diabetes     CBG: Recent Labs  Lab 11/30/19 1550 11/30/19 1918 11/30/19 2314 12/01/19 0300 12/01/19 0710  GLUCAP 262* 248* 283* 244* 207*    CRITICAL CARE Performed by: Cristal Generous   Total critical care time: 36 minutes  Critical care time was exclusive of separately billable procedures and treating other patients.  Critical care was necessary to treat or prevent imminent or life-threatening deterioration.  Critical care was time spent personally by me on the following activities: development of treatment plan with patient and/or surrogate as well as nursing, discussions with consultants, evaluation of patient's response to treatment, examination of patient, obtaining history from patient or surrogate, ordering and performing treatments and interventions, ordering and review of laboratory studies, ordering and review of radiographic studies, pulse oximetry and re-evaluation of patient's condition.  Eliseo Gum MSN, AGACNP-BC [place] [phone] If no answer, [phone] 12/01/2019, 7:46 AM

## 2019-12-01 NOTE — Progress Notes (Signed)
SLP Cancellation Note  Patient Details Name: Nicole [name] MRN: [id number] DOB: May 04, 1940   Cancelled treatment:       Reason Eval/Treat Not Completed: Patient's level of consciousness. Patient not alert at time of SLP attempt for SLE. Will attempt next date.   Sonia Baller, MA, CCC-SLP Speech Therapy [facility] Acute Rehab

## 2019-12-01 NOTE — Progress Notes (Signed)
Neurosurgery Service Progress Note  Subjective: No acute events overnight  Objective: Vitals:   12/01/19 0400 12/01/19 0500 12/01/19 0600 12/01/19 0800  BP: (!) 134/58 121/69 104/62 119/80  Pulse: 88 79 90 97  Resp:    (!) 23  Temp: 99 F (37.2 C)   99 F (37.2 C)  TempSrc: Axillary   Axillary  SpO2: 99% 98% 98% 99%  Weight:        Physical Exam: Eyes open spont, regards, PERRL, gaze conjugate, MAEx4 spont but not FC, no verbal output, groin site no palpable hematoma  Assessment & Plan: 79 y.o. woman w/ spontaneous SAH, CTA neg.  -catheter angio next week, on nimodipine -no changes in neurosurgical plan of care  Judith Part  12/01/19 10:31 AM

## 2019-12-02 ENCOUNTER — Inpatient Hospital Stay (HOSPITAL_COMMUNITY): Payer: Medicare Other

## 2019-12-02 DIAGNOSIS — I159 Secondary hypertension, unspecified: Secondary | ICD-10-CM

## 2019-12-02 DIAGNOSIS — I609 Nontraumatic subarachnoid hemorrhage, unspecified: Secondary | ICD-10-CM | POA: Diagnosis not present

## 2019-12-02 DIAGNOSIS — G9340 Encephalopathy, unspecified: Secondary | ICD-10-CM | POA: Diagnosis not present

## 2019-12-02 LAB — CULTURE, BLOOD (ROUTINE X 2): Culture: NO GROWTH

## 2019-12-02 LAB — GLUCOSE, CAPILLARY
Glucose-Capillary: 184 mg/dL — ABNORMAL HIGH (ref 70–99)
Glucose-Capillary: 185 mg/dL — ABNORMAL HIGH (ref 70–99)
Glucose-Capillary: 195 mg/dL — ABNORMAL HIGH (ref 70–99)
Glucose-Capillary: 237 mg/dL — ABNORMAL HIGH (ref 70–99)
Glucose-Capillary: 242 mg/dL — ABNORMAL HIGH (ref 70–99)
Glucose-Capillary: 252 mg/dL — ABNORMAL HIGH (ref 70–99)
Glucose-Capillary: 266 mg/dL — ABNORMAL HIGH (ref 70–99)

## 2019-12-02 MED ORDER — BETHANECHOL CHLORIDE 10 MG PO TABS
10.0000 mg | ORAL_TABLET | Freq: Three times a day (TID) | ORAL | Status: DC
  Administered 2019-12-02 – 2019-12-05 (×12): 10 mg
  Filled 2019-12-02 (×12): qty 1

## 2019-12-02 MED ORDER — INSULIN ASPART 100 UNIT/ML ~~LOC~~ SOLN
6.0000 [IU] | SUBCUTANEOUS | Status: DC
  Administered 2019-12-02 – 2019-12-06 (×23): 6 [IU] via SUBCUTANEOUS

## 2019-12-02 MED ORDER — CHLORHEXIDINE GLUCONATE 0.12 % MT SOLN
15.0000 mL | Freq: Two times a day (BID) | OROMUCOSAL | Status: DC
  Administered 2019-12-02 – 2019-12-06 (×8): 15 mL via OROMUCOSAL
  Filled 2019-12-02 (×6): qty 15

## 2019-12-02 MED ORDER — CHLORHEXIDINE GLUCONATE CLOTH 2 % EX PADS
6.0000 | MEDICATED_PAD | Freq: Every day | CUTANEOUS | Status: DC
  Administered 2019-12-03 – 2019-12-06 (×4): 6 via TOPICAL

## 2019-12-02 MED ORDER — ORAL CARE MOUTH RINSE
15.0000 mL | Freq: Two times a day (BID) | OROMUCOSAL | Status: DC
  Administered 2019-12-03 – 2019-12-07 (×8): 15 mL via OROMUCOSAL

## 2019-12-02 MED ORDER — INSULIN GLARGINE 100 UNIT/ML ~~LOC~~ SOLN
20.0000 [IU] | Freq: Every day | SUBCUTANEOUS | Status: DC
  Administered 2019-12-02: 20 [IU] via SUBCUTANEOUS
  Filled 2019-12-02 (×2): qty 0.2

## 2019-12-02 NOTE — Progress Notes (Signed)
NAME:  Nicole [name], MRN:  [id number], DOB:  06-07-1940, LOS: 5 ADMISSION DATE:  12/17/2019, CONSULTATION DATE:  11/28/2019 REFERRING MD:  Dr. Lupita Leash, CHIEF COMPLAINT:  [place]   Brief History   79 year old from rehab on Eliquis for Afib and acute DVT presenting with fever, confusion, leukocytosis admitted to [facility] with concern for sepsis found on [facility] today with acute SAH.    Past Medical History  HTN, HLD, CAD, Afib chronically on Eliquis (not compliant), adenocarcinoma of the lung, DMT2, OA, bronchitis, headaches   Significant Hospital Events   11/3 admitted to [facility]  Consults:  PCCM NSGY  Procedures:   Significant Diagnostic Tests:  11/3 Vascular LE doppler >>  RIGHT:  - There is no evidence of deep vein thrombosis in the lower extremity.  - No cystic structure found in the popliteal fossa.  LEFT:  - Findings consistent with acute deep vein thrombosis involving the left  common femoral vein, SF junction, left femoral vein, left proximal  profunda vein, left popliteal vein, left posterior tibial veins, and left  peroneal veins.  - No cystic structure found in the popliteal fossa.   11/3 CT chest  >> 1. Constellation of findings likely reflect pulmonary edema versus atypical infection. 2. New small to moderate LEFT pleural effusion. 3. New LEFT and mediastinal supraclavicular lymphadenopathy, nonspecific and possibly reactive in etiology. Recommend attention on follow-up. 4. New 7 mm RIGHT apical nodular versus consolidative opacity. Non-contrast chest CT at 6-12 months is recommended. If the nodule is stable at time of repeat CT, then future CT at 18-24 months (from today's scan) 5. Increased conspicuity of a RIGHT thyroid nodule in comparison to priors. Recommend nonemergent evaluation with dedicated thyroid ultrasound.  11/4 CTH >> Large amount of subarachnoid hemorrhage is noted throughout the brain, most prominently seen in the basal cisterns and left anterior hemispheric fissure  and left Sylvian fissure. Blood is also noted in the posterior horns of both lateral ventricles. Hemorrhage is also noted in the fourth ventricle. These findings are concerning for possible rupture of cerebral aneurysm. Further evaluation with MRA or CTA is recommended.  11/4 CTA head/ neck >> No aneurysm identified. Remains highly suspicious for ruptured aneurysm and catheter angiography is recommended.  Micro Data:  11/3 SARS2 / flu  >> neg 11/3 UC >> multiple species 11/3 BCx2 >> 2/4 methicillin resistant stap epi  Antimicrobials:  11/3 cefepime >> 11/5 11/3 azithromycin >> 11/5 ceftriaxone  Interim history/subjective:  More lethargic, no longer talking, intermittently following commands.  Objective   Blood pressure 123/62, pulse 70, temperature 98.4 F (36.9 C), temperature source Axillary, resp. rate (!) 22, weight 76.1 kg, SpO2 100 %.        Intake/Output Summary (Last 24 hours) at 12/02/2019 0731 Last data filed at 12/01/2019 2000 Gross per 24 hour  Intake 1040 ml  Output 450 ml  Net 590 ml   Filed Weights   12/05/2019 2020  Weight: 76.1 kg   Examination: General:  Critically ill appearing woman laying in bed in NAD HEENT: [place]/AT, eyes anicteric, cortrak in place.  Neuro: awake, tracking, but not attempting to communicate at all. Moving arms spontaneously. Not following commands. Not withdrawing from pain, but grimacing. CV: irreg rhythm, regular rate. No murmur. PULM: breathing comfortably on RA, CTAB GI: soft, NT, ND, +BS Extremities: BLE edema L>R. No cyanosis or clubbing.  Skin: ecchymoses on arms, no rashes.  Resolved Hospital Problem list   AKI Non-anion gap metabolic acidosis Assessment &  Plan:    Hold tramadol Angiogram ? Adding lantus, TF coverage  Ataumatic subarachnoid hemorrhage-- likely in setting of anticoagulation, s/p kcentra CT angiogram of head showed no aneurysm P -NSGY following -- plan for cerebral angiogram this week -continue q1hr  neuro checks -nimodipine prophylaxis -SBP goal 100-140;  -con't keppra prophylaxis  Afib;  RVR resolved P -Con't Diltiazem 90mg  q6hr PO -holing anticoagulation in setting of SAH    SIRS unclear etiology Cx data unremarkable (1x Bx with staph epidermis, likely contaminant). Afebrile, fairly stable WBC.  -CT chest 11/3 with pulm edema vs infection P -complete 5 days of empiric abx (azithromycin and ceftriaxone- last day today)  -trend fever curve, WBC   Acute DVT of LLE P -s/p IVC filter placement  -not a candidate for anticoagulation in setting of SAH; SCDs only on R   Hypokalemia, improved Hypomagnesemia, improved Hypophosphatemia, improved  S/p replacement P -continue to trend electrolytes and replete as needed -goal K 4, goal mag 2 in setting of Afib   DMT2 with uncontrolled hyperglycemia P -increase levemir to 20 units daily -adding TF coverage -SSI PRN -goal BG 140-180 -DM coordinator consulted    L mediastinal supraclavicular lymphadenopathy  R apical nodule vs consolidation, 66mm R thyroid nodule  Hx adenocarcinoma of lung  P -will need further outpt evaluation when acute illness course is improved, with repeat CT chest and thyroid ultrasound   Best practice:  Diet: EN  Pain/Anxiety/Delirium protocol (if indicated): PRN tramadol  VAP protocol (if indicated): n/a DVT prophylaxis: Holding off due to subarachnoid hemorrhage GI prophylaxis:n/a Glucose control: Sliding scale Mobility: Out of bed to chair Code Status: Full code Family Communication: brother updated at bedside  Disposition: ICU  Labs   CBC: Recent Labs  Lab 12/07/2019 0954 12/09/2019 0954 11/28/19 0137 11/28/19 1235 12/02/2019 0023 11/30/19 0443 12/01/19 0357  WBC 13.7*   < > 12.6* 12.9* 10.1 10.1 12.0*  NEUTROABS 11.6*  --   --   --   --   --   --   HGB 12.2   < > 12.1 11.4* 12.1 10.3* 11.2*  HCT 39.1   < > 36.6 34.8* 37.3 32.1* 34.7*  MCV 96.5   < > 93.4 92.6 94.7 94.4 94.0  PLT 300    < > 293 271 290 276 358   < > = values in this interval not displayed.    Basic Metabolic Panel: Recent Labs  Lab 11/28/19 0137 11/28/19 1320 11/28/2019 0023 12/10/2019 1533 11/30/19 0443 11/30/19 1652 12/01/19 0357  NA 134* 135 138  --  137  --  138  K 4.1 3.3* 2.7*  --  3.6  --  4.5  CL 99 100 99  --  104  --  106  CO2 24 24 27   --  24  --  24  GLUCOSE 207* 205* 190*  --  266*  --  267*  BUN 31* 27* 19  --  18  --  22  CREATININE 1.09* 0.93 0.73  --  0.67  --  0.68  CALCIUM 9.3 8.7* 9.3  --  8.9  --  9.1  MG  --   --   --  1.7 1.6* 2.3 2.3  PHOS  --   --   --  1.7* 1.9* 2.3* 2.6   GFR: Estimated Creatinine Clearance: 61.4 mL/min (by C-G formula based on SCr of 0.68 mg/dL). Recent Labs  Lab 11/28/2019 0954 12/10/2019 1353 11/28/19 0137 11/28/19 1235 12/14/2019 0023 11/30/19 0443 12/01/19  0357  WBC 13.7*  --    < > 12.9* 10.1 10.1 12.0*  LATICACIDVEN 2.7* 2.2*  --   --   --   --   --    < > = values in this interval not displayed.    Liver Function Tests: Recent Labs  Lab 12/10/2019 0954 11/28/19 0137 12/12/2019 0023 11/30/19 0443 12/01/19 0357  AST 21 18 17 18 31   ALT 12 13 12 14 14   ALKPHOS 63 65 58 56 71  BILITOT 1.6* 0.7 1.1 0.6 0.6  PROT 5.9* 6.2* 5.8* 5.0* 5.5*  ALBUMIN 2.6* 2.7* 2.5* 2.1* 2.2*   No results for input(s): LIPASE, AMYLASE in the last 168 hours. No results for input(s): AMMONIA in the last 168 hours.  ABG    Component Value Date/Time   HCO3 20.6 12/14/2019 2024   ACIDBASEDEF 2.7 (H) 11/30/2019 2024   O2SAT 86.3 11/25/2019 2024     Coagulation Profile: Recent Labs  Lab 12/01/2019 0954 11/28/19 1320 12/19/2019 0023  INR 1.3* 1.7* 1.3*    Cardiac Enzymes: Recent Labs  Lab 11/28/2019 2024  CKTOTAL 74    HbA1C: Hemoglobin A1C  Date/Time Value Ref Range Status  06/17/2019 04:58 PM 8.8 (A) 4.0 - 5.6 % Final   Hgb A1c MFr Bld  Date/Time Value Ref Range Status  12/12/2019 08:24 PM 6.4 (H) 4.8 - 5.6 % Final    Comment:    (NOTE) Pre  diabetes:          5.7%-6.4%  Diabetes:              >6.4%  Glycemic control for   <7.0% adults with diabetes   04/04/2019 05:33 AM 8.6 (H) 4.8 - 5.6 % Final    Comment:    (NOTE) Pre diabetes:          5.7%-6.4% Diabetes:              >6.4% Glycemic control for   <7.0% adults with diabetes     CBG: Recent Labs  Lab 12/01/19 1604 12/01/19 2006 12/02/19 0003 12/02/19 0412 12/02/19 0728  GLUCAP 210* 274* 237* 252* 266*    This patient is critically ill with multiple organ system failure which requires frequent high complexity decision making, assessment, support, evaluation, and titration of therapies. This was completed through the application of advanced monitoring technologies and extensive interpretation of multiple databases. During this encounter critical care time was devoted to patient care services described in this note for 36 minutes.  Julian Hy, DO 12/02/19 7:31 AM [facility]

## 2019-12-02 NOTE — Progress Notes (Signed)
Overall not much change.  Patient awake and aware.  Minimal efforts at verbalization.  Purposeful movements bilaterally but not to command.  Neck somewhat stiff.  Pupils equal round reactive.  Large volume subarachnoid hemorrhage complicated by anticoagulation.  Initial studies negative for aneurysm or vascular malformation.  Plan for catheter angiogram sometime early this week per Dr. Kathyrn Sheriff.

## 2019-12-02 NOTE — Progress Notes (Signed)
Physical Therapy Treatment Patient Details Name: Nicole [name] MRN: [id number] DOB: 1940/11/14 Today's Date: 12/02/2019    History of Present Illness The pt is a 79 yo female presenting with confusion/deliruim. Upon admission, found to have pneumonia and imaging revealed Moderate volume subarachnoid hemorrhage mostly in basal cisterns and left anterior hemispheric fissure. PMH includes: afib, adenocarcinoma stage 3, CAD, DM II, HTN, and back pain.  Cortrak and IVC filter placed 11/5     PT Comments    Pt more restless and less able to focus on therapist, family or session.  Emphasis on ROM/stretching, transitions, sitting balance/activation of trunk and following direction/responding to stimuli.   Follow Up Recommendations  SNF;Supervision/Assistance - 24 hour     Equipment Recommendations  None recommended by PT    Recommendations for Other Services       Precautions / Restrictions Precautions Precautions: Fall Restrictions Weight Bearing Restrictions: No    Mobility  Bed Mobility Overal bed mobility: Needs Assistance Bed Mobility: Rolling;Sidelying to Sit;Sit to Sidelying Rolling: Total assist Sidelying to sit: Total assist;+2 for physical assistance     Sit to sidelying: Total assist General bed mobility comments: pt not focused enough to help.  Assisted roll with padding and truncal/legs to come up and back to side.  Transfers                 General transfer comment: NT today  Ambulation/Gait             General Gait Details: not able today   Stairs             Wheelchair Mobility    Modified Rankin (Stroke Patients Only)       Balance Overall balance assessment: Needs assistance Sitting-balance support: Single extremity supported;No upper extremity supported;Feet supported Sitting balance-Leahy Scale: Poor Sitting balance - Comments: pt unable to maintain balance without external assist.  Sat for ~10 min working on truncal  activation and getting responses to stimuli                                    Cognition Arousal/Alertness: Awake/alert Behavior During Therapy: Restless Overall Cognitive Status: Impaired/Different from baseline Area of Impairment: Orientation;Attention;Memory;Following commands;Safety/judgement;Awareness;Problem solving                 Orientation Level: Situation;Time;Place Current Attention Level: Focused   Following Commands: Follows one step commands inconsistently Safety/Judgement: Decreased awareness of safety;Decreased awareness of deficits Awareness: Intellectual Problem Solving: Slow processing;Decreased initiation;Requires verbal cues;Requires tactile cues General Comments: Encephalopathic/confused due to sepsis      Exercises Other Exercises Other Exercises: Warm up U and LE ROM bil, no directed attempts to assist, a few attemps R UE to resist extension    General Comments General comments (skin integrity, edema, etc.): vss      Pertinent Vitals/Pain Pain Assessment: Faces Faces Pain Scale: Hurts even more Pain Location: Right hip/?right flank Pain Descriptors / Indicators: Discomfort;Grimacing;Guarding (crying out) Pain Intervention(s): Limited activity within patient's tolerance;Monitored during session;Premedicated before session    Home Living Family/patient expects to be discharged to:: Private residence Living Arrangements: Alone Available Help at Discharge: Family;Available PRN/intermittently Type of Home: House Home Access: Stairs to enter Entrance Stairs-Rails: Right;Left;Can reach both Home Layout: One level Home Equipment: Environmental consultant - 2 wheels;Cane - single point;Bedside commode;Shower seat;Wheelchair - manual      Prior Function Level of Independence: Independent with assistive device(s) (This was  prior to a month or more ago.)          PT Goals (current goals can now be found in the care plan section) Acute Rehab PT  Goals PT Goal Formulation: With patient/family Time For Goal Achievement: 12/14/19 Potential to Achieve Goals: Good Progress towards PT goals: Not progressing toward goals - comment (more encephalopathic)    Frequency    Min 3X/week      PT Plan      Co-evaluation PT/OT/SLP Co-Evaluation/Treatment: Yes Reason for Co-Treatment: For patient/therapist safety PT goals addressed during session: Mobility/safety with mobility        AM-PAC PT "6 Clicks" Mobility   Outcome Measure  Help needed turning from your back to your side while in a flat bed without using bedrails?: Total Help needed moving from lying on your back to sitting on the side of a flat bed without using bedrails?: Total Help needed moving to and from a bed to a chair (including a wheelchair)?: Total Help needed standing up from a chair using your arms (e.g., wheelchair or bedside chair)?: Total Help needed to walk in hospital room?: Total Help needed climbing 3-5 steps with a railing? : Total 6 Click Score: 6    End of Session   Activity Tolerance: Patient tolerated treatment well;Patient limited by pain;Other (comment) (Encephalopathy limiting session) Patient left: in bed;with call bell/phone within reach;with bed alarm set;with family/visitor present Nurse Communication: Mobility status PT Visit Diagnosis: Other abnormalities of gait and mobility (R26.89);Muscle weakness (generalized) (M62.81);Other symptoms and signs involving the nervous system (R29.898);Pain Pain - Right/Left: Right Pain - part of body: Hip (flank)     Time: 3735-7897 PT Time Calculation (min) (ACUTE ONLY): 29 min  Charges:  $Therapeutic Activity: 8-22 mins                     12/02/2019  [name], PT Acute Rehabilitation Services [phone]  (pager) [phone]  (office)   [name] 12/02/2019, 11:41 AM

## 2019-12-02 NOTE — Evaluation (Signed)
Occupational Therapy Evaluation Patient Details Name: Nicole [name] MRN: [id number] DOB: 1940/12/23 Today's Date: 12/02/2019    History of Present Illness The pt is a 79 yo female presenting with confusion/deliruim. Upon admission, found to have pneumonia and imaging revealed Moderate volume subarachnoid hemorrhage mostly in basal cisterns and left anterior hemispheric fissure. PMH includes: afib, adenocarcinoma stage 3, CAD, DM II, HTN, and back pain.  Cortrak and IVC filter placed 11/5    Clinical Impression   This 79 y/o female presents with the above. Pt currently presenting with the above and below listed deficits including pain with transitions during bed mobility, global weakness, impaired cognition. Pt overall requiring totalA (+2) for safe completion of bed mobility and ADL tasks at this time. Pt tolerated sitting EOB approx 10 min during session given max-totalA for static balance EOB. Pt intermittently tracking to name call/auditory stimuli but with minimal to no command follow otherwise. VSS throughout. She will benefit from continued acute OT services and currently recommend follow up therapy services in SNF setting to maximize her overall safety and independence with ADL and mobility.     Follow Up Recommendations  SNF    Equipment Recommendations  Other (comment) (TBD)           Precautions / Restrictions Precautions Precautions: Fall Restrictions Weight Bearing Restrictions: No      Mobility Bed Mobility Overal bed mobility: Needs Assistance Bed Mobility: Rolling;Sidelying to Sit;Sit to Sidelying Rolling: Total assist Sidelying to sit: Total assist;+2 for physical assistance     Sit to sidelying: Total assist General bed mobility comments: pt not focused enough to help.  Assisted roll with padding and truncal/legs to come up and back to side.    Transfers                 General transfer comment: NT today    Balance Overall balance assessment:  Needs assistance Sitting-balance support: Single extremity supported;No upper extremity supported;Feet supported Sitting balance-Leahy Scale: Poor Sitting balance - Comments: pt unable to maintain balance without external assist.  Sat for ~10 min working on truncal activation and getting responses to stimuli                                   ADL either performed or assessed with clinical judgement   ADL Overall ADL's : Needs assistance/impaired                                       General ADL Comments: pt totalA at this time given level of confusion/cognitive impairments      Vision   Vision Assessment?: Vision impaired- to be further tested in functional context Additional Comments: will intermittently track towards therapist and brother but insconsistent     Perception     Praxis      Pertinent Vitals/Pain Pain Assessment: Faces Faces Pain Scale: Hurts even more Pain Location: Right hip/?right flank Pain Descriptors / Indicators: Discomfort;Grimacing;Guarding (crying out) Pain Intervention(s): Limited activity within patient's tolerance;Monitored during session;Premedicated before session;Repositioned     Hand Dominance Right   Extremity/Trunk Assessment Upper Extremity Assessment Upper Extremity Assessment: Generalized weakness;Difficult to assess due to impaired cognition (resistive to some attempts at PROM)   Lower Extremity Assessment Lower Extremity Assessment: Defer to PT evaluation       Communication Communication Communication: Other (comment) (  too ?encephalopathic to communicate.)   Cognition Arousal/Alertness: Awake/alert Behavior During Therapy: Restless Overall Cognitive Status: Impaired/Different from baseline Area of Impairment: Orientation;Attention;Memory;Following commands;Safety/judgement;Awareness;Problem solving                 Orientation Level: Situation;Time;Place Current Attention Level: Focused    Following Commands: Follows one step commands inconsistently Safety/Judgement: Decreased awareness of safety;Decreased awareness of deficits Awareness: Intellectual Problem Solving: Slow processing;Decreased initiation;Requires verbal cues;Requires tactile cues General Comments: Encephalopathic/confused due to sepsis; no command follow, will intermittently track/focus on therapist or brother in room    General Comments  VSS    Exercises Exercises: Other exercises Other Exercises Other Exercises: Warm up U and LE ROM bil, no directed attempts to assist, a few attemps R UE to resist extension   Shoulder Instructions      Home Living Family/patient expects to be discharged to:: Private residence Living Arrangements: Alone Available Help at Discharge: Family;Available PRN/intermittently Type of Home: House Home Access: Stairs to enter [employer] of Steps: 5 Entrance Stairs-Rails: Right;Left;Can reach both Home Layout: One level     Bathroom Shower/Tub: Walk-in shower;Tub/shower unit   Bathroom Toilet: Handicapped height Bathroom Accessibility: Yes   Home Equipment: Walker - 2 wheels;Cane - single point;Bedside commode;Shower seat;Wheelchair - manual   Additional Comments: home setup obtained from PT eval as pt unable to relay info      Prior Functioning/Environment Level of Independence: Independent with assistive device(s) (This was prior to a month or more ago.)                 OT Problem List: Decreased strength;Decreased range of motion;Decreased activity tolerance;Impaired balance (sitting and/or standing);Impaired vision/perception;Decreased cognition;Decreased safety awareness;Decreased knowledge of use of DME or AE;Decreased knowledge of precautions;Obesity;Impaired UE functional use;Pain      OT Treatment/Interventions: Self-care/ADL training;Therapeutic exercise;Neuromuscular education;Energy conservation;DME and/or AE instruction;Therapeutic  activities;Cognitive remediation/compensation;Patient/family education;Balance training;Visual/perceptual remediation/compensation    OT Goals(Current goals can be found in the care plan section) Acute Rehab OT Goals Patient Stated Goal: unable to state OT Goal Formulation: Patient unable to participate in goal setting Time For Goal Achievement: 12/16/19 Potential to Achieve Goals: Fair  OT Frequency: Min 2X/week   Barriers to D/C:            Co-evaluation PT/OT/SLP Co-Evaluation/Treatment: Yes Reason for Co-Treatment: For patient/therapist safety;Necessary to address cognition/behavior during functional activity PT goals addressed during session: Mobility/safety with mobility OT goals addressed during session: Strengthening/ROM      AM-PAC OT "6 Clicks" Daily Activity     Outcome Measure Help from another person eating meals?: Total Help from another person taking care of personal grooming?: Total Help from another person toileting, which includes using toliet, bedpan, or urinal?: Total Help from another person bathing (including washing, rinsing, drying)?: Total Help from another person to put on and taking off regular upper body clothing?: Total Help from another person to put on and taking off regular lower body clothing?: Total 6 Click Score: 6   End of Session Nurse Communication: Mobility status  Activity Tolerance: Patient limited by pain;Patient tolerated treatment well Patient left: in bed;with call bell/phone within reach;with family/visitor present;with restraints reapplied  OT Visit Diagnosis: Muscle weakness (generalized) (M62.81);Cognitive communication deficit (R41.841);Other abnormalities of gait and mobility (R26.89) Symptoms and signs involving cognitive functions: Nontraumatic SAH                Time: 1020-1049 OT Time Calculation (min): 29 min Charges:  OT General Charges $OT Visit: 1 Visit OT Evaluation $  OT Eval Moderate Complexity: [place], OT Acute Rehabilitation Services Pager [phone] Office [phone]   Raymondo Band 12/02/2019, 1:40 PM

## 2019-12-02 NOTE — Progress Notes (Signed)
Transcranial Doppler  Date POD PCO2 HCT BP  MCA ACA PCA OPHT SIPH VERT Basilar  11/6 MS/CK     Right  Left   *  *   *  -21   *  *   16  17   38  21   -15  -21   -24      11/8 GC     Right  Left   *  *   *  *   *  *   26  20   24  21    *  *   *           Right  Left                                             Right  Left                                             Right  Left                                            Right  Left                                            Right  Left                                        MCA = Middle Cerebral Artery      OPHT = Opthalmic Artery     BASILAR = Basilar Artery   ACA = Anterior Cerebral Artery     SIPH = Carotid Siphon PCA = Posterior Cerebral Artery   VERT = Verterbral Artery                   Normal MCA = 62+\-12 ACA = 50+\-12 PCA = 42+\-23   *Unable to insonate due to technical limitations (poor acoustic windows, patient unable to cooperate).  12/02/19 1:39 PM Nicole [name] RVT

## 2019-12-03 ENCOUNTER — Inpatient Hospital Stay: Payer: Self-pay

## 2019-12-03 ENCOUNTER — Ambulatory Visit (HOSPITAL_COMMUNITY): Payer: Medicare Other

## 2019-12-03 DIAGNOSIS — I609 Nontraumatic subarachnoid hemorrhage, unspecified: Secondary | ICD-10-CM | POA: Diagnosis not present

## 2019-12-03 LAB — GLUCOSE, CAPILLARY
Glucose-Capillary: 193 mg/dL — ABNORMAL HIGH (ref 70–99)
Glucose-Capillary: 168 mg/dL — ABNORMAL HIGH (ref 70–99)
Glucose-Capillary: 220 mg/dL — ABNORMAL HIGH (ref 70–99)
Glucose-Capillary: 195 mg/dL — ABNORMAL HIGH (ref 70–99)
Glucose-Capillary: 202 mg/dL — ABNORMAL HIGH (ref 70–99)
Glucose-Capillary: 161 mg/dL — ABNORMAL HIGH (ref 70–99)

## 2019-12-03 LAB — COMPREHENSIVE METABOLIC PANEL
ALT: 21 U/L (ref 0–44)
AST: 28 U/L (ref 15–41)
Albumin: 2.1 g/dL — ABNORMAL LOW (ref 3.5–5.0)
Alkaline Phosphatase: 79 U/L (ref 38–126)
Anion gap: 10 (ref 5–15)
BUN: 29 mg/dL — ABNORMAL HIGH (ref 8–23)
CO2: 23 mmol/L (ref 22–32)
Calcium: 8.8 mg/dL — ABNORMAL LOW (ref 8.9–10.3)
Chloride: 107 mmol/L (ref 98–111)
Creatinine, Ser: 0.59 mg/dL (ref 0.44–1.00)
GFR, Estimated: >60 mL/min (ref >60)
Glucose, Bld: 200 mg/dL — ABNORMAL HIGH (ref 70–99)
Potassium: 4.4 mmol/L (ref 3.5–5.1)
Sodium: 140 mmol/L (ref 135–145)
Total Bilirubin: 0.4 mg/dL (ref 0.3–1.2)
Total Protein: 5.4 g/dL — ABNORMAL LOW (ref 6.5–8.1)

## 2019-12-03 LAB — MAGNESIUM: Magnesium: 2 mg/dL (ref 1.7–2.4)

## 2019-12-03 MED ORDER — TRAMADOL 5 MG/ML ORAL SUSPENSION: 25.0000 mg | Freq: Four times a day (QID) | ORAL | Status: DC

## 2019-12-03 MED ORDER — METOPROLOL TARTRATE 25 MG/10 ML ORAL SUSPENSION
25.0000 mg | Freq: Three times a day (TID) | ORAL | Status: DC
  Administered 2019-12-03 – 2019-12-06 (×9): 25 mg
  Filled 2019-12-03 (×9): qty 10

## 2019-12-03 MED ORDER — POLYETHYLENE GLYCOL 3350 17 G PO PACK
17.0000 g | PACK | Freq: Every day | ORAL | Status: DC
  Administered 2019-12-03: 17 g
  Filled 2019-12-03: qty 1

## 2019-12-03 MED ORDER — INSULIN GLARGINE 100 UNIT/ML ~~LOC~~ SOLN
24.0000 [IU] | Freq: Every day | SUBCUTANEOUS | Status: DC
  Administered 2019-12-03 – 2019-12-05 (×3): 24 [IU] via SUBCUTANEOUS
  Filled 2019-12-03 (×4): qty 0.24

## 2019-12-03 MED ORDER — TRAMADOL HCL 50 MG PO TABS
25.0000 mg | ORAL_TABLET | Freq: Four times a day (QID) | ORAL | Status: DC
  Administered 2019-12-03 – 2019-12-06 (×12): 25 mg
  Filled 2019-12-03 (×12): qty 1

## 2019-12-03 MED ORDER — METOPROLOL TARTRATE 5 MG/5ML IV SOLN
5.0000 mg | INTRAVENOUS | Status: AC
  Administered 2019-12-03: 5 mg via INTRAVENOUS
  Filled 2019-12-03: qty 5

## 2019-12-03 MED ORDER — SENNA 8.6 MG PO TABS
2.0000 | ORAL_TABLET | Freq: Two times a day (BID) | ORAL | Status: DC
  Administered 2019-12-03: 17.2 mg
  Filled 2019-12-03: qty 2

## 2019-12-03 NOTE — Evaluation (Signed)
Clinical/Bedside Swallow Evaluation Patient Details  Name: Nicole [name] MRN: [id number] Date of Birth: April 26, 1940  Today's Date: 12/03/2019 Time: SLP Start Time (ACUTE ONLY): 1038 SLP Stop Time (ACUTE ONLY): 1058 SLP Time Calculation (min) (ACUTE ONLY): 20 min  Past Medical History:  Past Medical History:  Diagnosis Date  . A-fib ([place])   . Adenocarcinoma of right lung, stage 3 ([place]) 07/28/2016  . Atrial flutter ([place]) 10/05/2016  . Bronchitis    hx of  . 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 Cancer-related pain 10/06/2016  . Coronary artery disease 02/21/2017   JAN 2019 Prox RCA lesion is 25% stenosed. Prox Cx lesion is 30% stenosed. Mid Cx lesion is 25% stenosed. Ost 3rd Mrg lesion is 30% stenosed. Prox LAD lesion is 25% stenosed. Mid LAD lesion is 25% stenosed. The left ventricular systolic function is normal. LV end diastolic pressure is normal. The left ventricular ejection fraction is 55-65% by visual estimate. There is no aortic valve stenosis.   N  . Diabetes mellitus    type 2  . Edema 07/12/2017  . Essential hypertension 01/20/2009  . Gallstones   . Headache   . History of radiation therapy 08/09/2016 to 09/19/2016   Right lung was treated to 60 Gy in 30 fractions at 2 Gy per fraction  . Hypertension   . Hypertriglyceridemia 08/22/2012  . Low back pain   . Lung mass    with lymphadenopathy  . Osteoarthritis   . Type II diabetes mellitus with manifestations ([place]) 01/20/2009   Estimated Creatinine Clearance: 49.8 mL/min (by C-G formula based on SCr of 1 mg/dL).   Past Surgical History:  Past Surgical History:  Procedure Laterality Date  . ABDOMINAL HYSTERECTOMY  2004  . APPENDECTOMY  when 79 years old  . COLONOSCOPY  08-05-09   [name]  . I & D EXTREMITY Right 04/04/2019   Procedure: IRRIGATION AND DEBRIDEMENT RIGHT HAND;  Surgeon: Verner Mould, MD;  Location: [facility];  Service: Orthopedics;  Laterality: Right;  . INCONTINENCE SURGERY    . IR IVC FILTER PLMT  / S&I /IMG GUID/MOD SED  12/23/2019  . IR KYPHO EA ADDL LEVEL THORACIC OR LUMBAR  11/22/2019  . IR KYPHO LUMBAR INC FX REDUCE BONE BX UNI/BIL CANNULATION INC/IMAGING  11/22/2019  . IR RADIOLOGIST EVAL & MGMT  11/13/2019  . KNEE ARTHROSCOPY Left   . LEFT HEART CATH AND CORONARY ANGIOGRAPHY N/A 02/24/2017   Procedure: LEFT HEART CATH AND CORONARY ANGIOGRAPHY;  Surgeon: Jettie Booze, MD;  Location: [facility];  Service: Cardiovascular;  Laterality: N/A;  . LEFT HEART CATHETERIZATION WITH CORONARY ANGIOGRAM N/A 07/19/2013   Procedure: LEFT HEART CATHETERIZATION WITH CORONARY ANGIOGRAM;  Surgeon: Sinclair Grooms, MD;  Location: [facility] CATH LAB;  Service: Cardiovascular;  Laterality: N/A;  . POLYPECTOMY  08-05-09   2 polyps  . TOTAL KNEE ARTHROPLASTY Left 03/28/2012   Procedure: LEFT TOTAL KNEE ARTHROPLASTY;  Surgeon: Tobi Bastos, MD;  Location: [facility];  Service: Orthopedics;  Laterality: Left;  . TUBAL LIGATION    . VIDEO BRONCHOSCOPY WITH ENDOBRONCHIAL ULTRASOUND N/A 07/11/2016   Procedure: VIDEO BRONCHOSCOPY WITH ENDOBRONCHIAL ULTRASOUND;  Surgeon: Marshell Garfinkel, MD;  Location: [facility];  Service: Pulmonary;  Laterality: N/A;   HPI:  79 yo female presenting from [place] with confusion/deliruim. Upon admission, found to have pneumonia and imaging revealed moderate volume subarachnoid hemorrhage mostly in basal cisterns and left anterior hemispheric fissure. PMH includes: afib, adenocarcinoma stage 3,  CAD, DM II, HTN, and back pain.  Cortrak and IVC filter placed 11/5. Pt initially passed RN stroke swallow screen, then presented with AMS and cortrak subsequently placed.    Assessment / Plan / Recommendation Clinical Impression  Limited clinical swallow assessment was completed.  Dtr-in-law, [name], was at bedside.  Pt's eyes were open; she was repositioned to optimize safety/participation.  Unable to follow commands.  Achieved spontaneous, but not purposeful, voicing.  Oral care  completed by RN immediately prior to assessment.  Offering of ice chips did not elicit motor response - no labial seal nor tongue mobility. 1/2 teaspoons of water moved passively from oral cavity into throat, eliciting a spontaneous swallow response on 3/3 opportunities. No overt coughing/throat-clearing noted.  Intemittent oral suctioning provided - minimal secretions removed; no purposeful oral movements elicited despite max cues.  Recommend continued NPO with cortrak.  SLP will follow for PO readiness and speech-language eval as mental status improves.  D/W [name] and Therapist, sports.  SLP Visit Diagnosis: Dysphagia, oropharyngeal phase (R13.12)    Aspiration Risk  Severe aspiration risk    Diet Recommendation   npo  Medication Administration: Via alternative means    Other  Recommendations Oral Care Recommendations: Oral care QID   Follow up Recommendations  (tba)      Frequency and Duration min 2x/week  2 weeks       Prognosis Prognosis for Safe Diet Advancement: Fair      Swallow Study   General Date of Onset: 12/23/2019 HPI: 79 yo female presenting from [place] with confusion/deliruim. Upon admission, found to have pneumonia and imaging revealed moderate volume subarachnoid hemorrhage mostly in basal cisterns and left anterior hemispheric fissure. PMH includes: afib, adenocarcinoma stage 3, CAD, DM II, HTN, and back pain.  Cortrak and IVC filter placed 11/5. Pt initially passed RN stroke swallow screen, then presented with AMS and cortrak subsequently placed.  Type of Study: Bedside Swallow Evaluation Previous Swallow Assessment: no Diet Prior to this Study: NPO;NG Tube Temperature Spikes Noted: No Respiratory Status: Room air Behavior/Cognition: Alert Oral Cavity Assessment: Within Functional Limits Oral Care Completed by SLP: Recent completion by staff Oral Cavity - Dentition: Adequate natural dentition Self-Feeding Abilities: Total assist Patient Positioning: Upright in  bed Baseline Vocal Quality: Low vocal intensity Volitional Cough: Cognitively unable to elicit Volitional Swallow: Unable to elicit    Oral/Motor/Sensory Function Overall Oral Motor/Sensory Function: Other (comment) (open mouth posture - does not follow  commands)   Ice Chips Ice chips: Impaired Presentation: Spoon Oral Phase Impairments: Reduced labial seal;Reduced lingual movement/coordination;Poor awareness of bolus Oral Phase Functional Implications: Prolonged oral transit   Thin Liquid Thin Liquid: Impaired Presentation: Spoon Oral Phase Impairments: Reduced labial seal;Reduced lingual movement/coordination;Poor awareness of bolus    Nectar Thick Nectar Thick Liquid: Not tested   Honey Thick Honey Thick Liquid: Not tested   Puree Puree: Not tested   Solid     Solid: Not tested      Juan Quam Laurice 12/03/2019,11:10 AM   Estill Bamberg L. Tivis Ringer, [place] Office number [phone] Pager [phone]

## 2019-12-03 NOTE — TOC Initial Note (Signed)
Transition of Care [facility]) - Initial/Assessment Note    Patient Details  Name: Nicole [name] MRN: [id number] Date of Birth: 1940-10-22  Transition of Care [facility]) CM/SW Contact:    Joanne Chars, LCSW Phone Number: 12/03/2019, 10:58 AM  Clinical Narrative:  CSW spoke with pt daughter [name] regarding pt residence at [facility].  Pt went there as short term rehab resident in October.  Daughter aware that PT still recommending SNF and does not want pt returned to [place] at discharge.  Daughter reports family is researching other options for SNF.  Pt is vaccinated.  Will continue to follow for discharge needs.             Expected Discharge Plan: Skilled Nursing Facility Barriers to Discharge: Continued Medical Work up, SNF Pending bed offer   Patient Goals and CMS Choice        Expected Discharge Plan and Services Expected Discharge Plan: [place] Choice: [place] Living arrangements for the past 2 months: [place]                                      Prior Living Arrangements/Services Living arrangements for the past 2 months: [place] Lives with:: Facility Resident Patient language and need for interpreter reviewed:: No Do you feel safe going back to the place where you live?: No   Daughter does not want daughter returned to [facility]  Need for Family Participation in Patient Care: Yes (Comment) Care giver support system in place?: Yes (comment)   Criminal Activity/Legal Involvement Pertinent to Current Situation/Hospitalization: No - Comment as needed  Activities of Daily Living      Permission Sought/Granted                  Emotional Assessment   Attitude/Demeanor/Rapport: Unable to Assess Affect (typically observed): Unable to Assess Orientation: :  (not oriented) Alcohol / Substance Use: Not Applicable Psych Involvement: No (comment)  Admission  diagnosis:  Sepsis ([place]) [A41.9] Sepsis with acute organ dysfunction without septic shock, due to unspecified organism, unspecified type ([place]) [A41.9, R65.20] Patient Active Problem List   Diagnosis Date Noted  . Menopausal symptom 12/24/2019  . Pressure injury of skin 11/28/2019  . Sepsis ([place]) 12/21/2019  . Persistent atrial fibrillation ([place]) 08/21/2019  . Senile purpura ([place]) 08/03/2019  . Decubitus ulcer of sacral region, stage 1 05/08/2019  . Diastolic dysfunction with chronic heart failure ([place]) 04/30/2019  . Encounter for orthopedic follow-up care 04/09/2019  . Abscess of hand 04/08/2019  . Paroxysmal atrial fibrillation ([place]) 02/20/2019  . Secondary hypercoagulable state ([place]) 02/20/2019  . External hemorrhoids 08/16/2018  . Hyperlipidemia with target LDL less than 100 07/18/2018  . Atherosclerosis of aorta ([place]) 07/18/2018  . Spinal stenosis at L4-L5 level 01/25/2018  . Arthritis of sacroiliac joint 01/11/2018  . Coronary artery disease 02/21/2017  . Pneumonitis, radiation ([place]) 01/26/2017  . Encounter for antineoplastic immunotherapy 10/12/2016  . Cancer-related pain 10/06/2016  . Atrial flutter ([place]) 10/05/2016  . Adenocarcinoma of right lung, stage 3 ([place]) 07/28/2016  . Encounter for antineoplastic chemotherapy 07/28/2016  . Pancreatic cyst 11/09/2015  . Routine general medical examination at a health care facility 06/30/2014  . Gout 01/18/2013  . Hypertriglyceridemia 08/22/2012  . Osteopenia 10/31/2011  . Type II diabetes mellitus with manifestations ([place]) 01/20/2009  . Essential hypertension 01/20/2009  .  Osteoarthritis 01/20/2009  . Endometrial carcinoma ([place]) 01/24/2002   PCP:  [name], MD Pharmacy:   [facility] [place], [place] - [place] [address] [place] [address] Phone: [phone] Fax: [place] [address] [place] - [place]  [address] [place] [place] [address] Phone: [phone] Fax: [phone]     Social Determinants of Health (SDOH) Interventions    Readmission Risk Interventions No flowsheet data found.

## 2019-12-03 NOTE — Consult Note (Signed)
WOC Nurse Consult Note: Reason for Consult:Deep tissue injury to sacrum, buttocks and perineal tissue.  Stage 1 (nonblanchable erythema) was noted on admission. However, daughter [name] is at bedside and states SNF has been treating this for awhile, so likely has been deep tissue injury since admission. Skin is intact with 0.2 cm epithelial sloughing noted near rectum from incontinence care.  Wound type:deep tissue injury Pressure Injury POA: Yes Measurement: 4 cm x 7 cm with nonintact skin measuring 0.2 cm round near rectum. Remaining skin is maroon discoloration Wound bed:red, moist Drainage (amount, consistency, odor) scant weeping Periwound: blanchable erythema to perimeter.  Patient is positioned on side and bedside RN assists with care. She states patient is getting turned and repositioned.  I explain to daughter the likely origin before admission but reassure her we are monitoring and will turn and reposition every two hours. Patient is on mattress with low air loss feature. Moisture wicking therapeuticlinen in place and will improve skin  Microclimate.  Dressing procedure/placement/frequency: COntinue to turn and reposition.  Barrier cream after incontinence. Sacral foam dressings.  Change every three days and PRN soilage.   Will not follow at this time.  Please re-consult if needed.  Domenic Moras MSN, RN, FNP-BC CWON Wound, Ostomy, Continence Nurse Pager [phone]

## 2019-12-03 NOTE — Progress Notes (Signed)
Providing Compassionate, Quality Care - Together   Subjective: Nurse reports no changes in patient's condition.  Objective: Vital signs in last 24 hours: Temp:  [97.6 F (36.4 C)-98.7 F (37.1 C)] 98.7 F (37.1 C) (11/09 0800) Pulse Rate:  [64-106] 106 (11/09 0918) Resp:  [17-34] 26 (11/09 0600) BP: (98-144)/(51-92) 115/92 (11/09 0918) SpO2:  [92 %-100 %] 99 % (11/09 0600)  Intake/Output from previous day: 11/08 0701 - 11/09 0700 In: 860 [NG/GT:660; IV Piggyback:200] Out: 1175 [Urine:1175] Intake/Output this shift: No intake/output data recorded.  Alert, non-communicative Incomprehensible speech MAE, localizes/withdraws to pain PERRLA   Lab Results: Recent Labs    12/01/19 0357  WBC 12.0*  HGB 11.2*  HCT 34.7*  PLT 358   BMET Recent Labs    12/01/19 0357  NA 138  K 4.5  CL 106  CO2 24  GLUCOSE 267*  BUN 22  CREATININE 0.68  CALCIUM 9.1    Studies/Results: VAS [place] TRANSCRANIAL DOPPLER  Result Date: 12/02/2019  Transcranial Doppler Indications: Subarachnoid hemorrhage. Limitations: Patient movement, poor patient cooperation Limitations for diagnostic windows: Unable to insonate right transtemporal window. Unable to insonate left transtemporal window. Comparison Study: 11/30/2019 Performing Technologist: Oliver Hum RVT  Examination Guidelines: A complete evaluation includes B-mode imaging, spectral Doppler, color Doppler, and power Doppler as needed of all accessible portions of each vessel. Bilateral testing is considered an integral part of a complete examination. Limited examinations for reoccurring indications may be performed as noted.  +----------+-------------+----------+-----------+------------------+ RIGHT TCD Right VM (cm)Depth (cm)Pulsatility     Comment       +----------+-------------+----------+-----------+------------------+ MCA                                         Unable to insonate  +----------+-------------+----------+-----------+------------------+ ACA                                         Unable to insonate +----------+-------------+----------+-----------+------------------+ Term ICA                                    Unable to insonate +----------+-------------+----------+-----------+------------------+ PCA                                         Unable to insonate +----------+-------------+----------+-----------+------------------+ Opthalmic     26.00                 1.32                       +----------+-------------+----------+-----------+------------------+ ICA siphon    24.00                 1.64                       +----------+-------------+----------+-----------+------------------+ Vertebral                                   Unable to insonate +----------+-------------+----------+-----------+------------------+  +----------+------------+----------+-----------+------------------+ LEFT TCD  Left VM (cm)Depth (cm)Pulsatility     Comment       +----------+------------+----------+-----------+------------------+  MCA                                        Unable to insonate +----------+------------+----------+-----------+------------------+ ACA                                        Unable to insonate +----------+------------+----------+-----------+------------------+ Term ICA                                   Unable to insonate +----------+------------+----------+-----------+------------------+ PCA                                        Unable to insonate +----------+------------+----------+-----------+------------------+ Opthalmic    20.00                 1.34                       +----------+------------+----------+-----------+------------------+ ICA siphon   21.00                 1.45                       +----------+------------+----------+-----------+------------------+ Vertebral                                   Unable to insonate +----------+------------+----------+-----------+------------------+  +------------+-------+------------------+             VM cm/s     Comment       +------------+-------+------------------+ Prox Basilar       Unable to insonate +------------+-------+------------------+ Dist Basilar       Unable to insonate +------------+-------+------------------+ Summary:  Absent bitemporal and occiptal windows limit evaluation. Normal mean flow velocities in both carotid siphons and opthalmic arteries. *See table(s) above for TCD measurements and observations.  Diagnosing physician: Antony Contras MD Electronically signed by Antony Contras MD on 12/02/2019 at 1:53:33 PM.    Final    [place] EKG SITE RITE  Result Date: 12/03/2019 If Site Rite image not attached, placement could not be confirmed due to current cardiac rhythm.   Assessment/Plan: Patient with diffuse subarachnoid hemorrhage within her basilar cisterns and interhemispheric space. There is no evidence of any intraparenchymal bleeding. The patient subsequently underwent CT angiogram which demonstrated normal arterial anatomy without evidence of aneurysm, vascular malformation or other obvious source of hemorrhage. CT scan 12/23/2019 shows slight decrease of SAH.   LOS: 6 days   -Plan is for angiogram with Dr. Kathyrn Sheriff 12/04/2019 or 12/05/2019.   Viona Gilmore, DNP, AGNP-C Nurse Practitioner  [facility] [place] [address], [place], [place] [address] P: [phone]    F: [phone]  12/03/2019, 10:26 AM

## 2019-12-03 NOTE — NC FL2 (Signed)
[place] LEVEL OF CARE SCREENING TOOL     IDENTIFICATION  Patient Name: Nicole [name] Birthdate: 02-27-1940 Sex: female Admission Date (Current Location): 12/23/2019  [facility] and [place] Number:  Herbalist and Address:  [facility], [place] [address], [place], [place] [address]      Provider Number: [id number]  Attending Physician Name and Address:  Julian Hy, DO  Relative Name and Phone Number:  [place],Jennifer Daughter [phone]    Current Level of Care: Hospital Recommended Level of Care: [place] Prior Approval Number:    Date Approved/Denied:   PASRR Number: [id number]  Discharge Plan: SNF    Current Diagnoses: Patient Active Problem List   Diagnosis Date Noted  . Menopausal symptom 12/07/2019  . Pressure injury of skin 11/28/2019  . Sepsis ([place]) 12/03/2019  . Persistent atrial fibrillation ([place]) 08/21/2019  . Senile purpura ([place]) 08/03/2019  . Decubitus ulcer of sacral region, stage 1 05/08/2019  . Diastolic dysfunction with chronic heart failure ([place]) 04/30/2019  . Encounter for orthopedic follow-up care 04/09/2019  . Abscess of hand 04/08/2019  . Paroxysmal atrial fibrillation ([place]) 02/20/2019  . Secondary hypercoagulable state ([place]) 02/20/2019  . External hemorrhoids 08/16/2018  . Hyperlipidemia with target LDL less than 100 07/18/2018  . Atherosclerosis of aorta ([place]) 07/18/2018  . Spinal stenosis at L4-L5 level 01/25/2018  . Arthritis of sacroiliac joint 01/11/2018  . Coronary artery disease 02/21/2017  . Pneumonitis, radiation ([place]) 01/26/2017  . Encounter for antineoplastic immunotherapy 10/12/2016  . Cancer-related pain 10/06/2016  . Atrial flutter ([place]) 10/05/2016  . Adenocarcinoma of right lung, stage 3 ([place]) 07/28/2016  . Encounter for antineoplastic chemotherapy 07/28/2016  . Pancreatic cyst 11/09/2015  . Routine general medical examination at a health care facility  06/30/2014  . Gout 01/18/2013  . Hypertriglyceridemia 08/22/2012  . Osteopenia 10/31/2011  . Type II diabetes mellitus with manifestations ([place]) 01/20/2009  . Essential hypertension 01/20/2009  . Osteoarthritis 01/20/2009  . Endometrial carcinoma ([place]) 01/24/2002    Orientation RESPIRATION BLADDER Height & Weight      (not oriented)  Vent Indwelling catheter Weight: 167 lb 12.3 oz (76.1 kg) Height:     BEHAVIORAL SYMPTOMS/MOOD NEUROLOGICAL BOWEL NUTRITION STATUS      Incontinent Feeding tube  AMBULATORY STATUS COMMUNICATION OF NEEDS Skin   Total Care Does not communicate Other (Comment) (ecchymosis)                       Personal Care Assistance Level of Assistance  Total care       Total Care Assistance: Maximum assistance   Functional Limitations Info  Sight, Hearing, Speech Sight Info: Adequate Hearing Info: Adequate Speech Info: Adequate    SPECIAL CARE FACTORS FREQUENCY  PT (By licensed PT), OT (By licensed OT)     PT Frequency: 5x week OT Frequency: 5x week            Contractures Contractures Info: Not present    Additional Factors Info  Code Status, Allergies Code Status Info: full Allergies Info: NKA           Current Medications (12/03/2019):  This is the current hospital active medication list Current Facility-Administered Medications  Medication Dose Route Frequency Provider Last Rate Last Admin  .  stroke: mapping our early stages of recovery book   Does not apply Once Margaretha Seeds, MD      . acetaminophen (TYLENOL) tablet 650 mg  650  mg Per Tube Q6H PRN Jacky Kindle, MD   650 mg at 12/02/19 1512   Or  . acetaminophen (TYLENOL) suppository 650 mg  650 mg Rectal Q6H PRN Jacky Kindle, MD      . bethanechol (URECHOLINE) tablet 10 mg  10 mg Per Tube TID Noemi Chapel P, DO   10 mg at 12/03/19 0911  . chlorhexidine (PERIDEX) 0.12 % solution 15 mL  15 mL Mouth Rinse BID Noemi Chapel P, DO   15 mL at 12/03/19 0916  . Chlorhexidine  Gluconate Cloth 2 % PADS 6 each  6 each Topical Daily Ogan, Ok[place]wo U, MD      . diltiazem (CARDIZEM) tablet 90 mg  90 mg Per Tube Q6H Jacky Kindle, MD   90 mg at 12/03/19 0532  . feeding supplement (OSMOLITE 1.2 CAL) liquid 1,000 mL  1,000 mL Per Tube Continuous Jacky Kindle, MD 60 mL/hr at 12/03/19 0920 1,000 mL at 12/03/19 0920  . hydrALAZINE (APRESOLINE) injection 10 mg  10 mg Intravenous Q6H PRN Margaretha Seeds, MD      . insulin aspart (novoLOG) injection 0-15 Units  0-15 Units Subcutaneous Q4H Jacky Kindle, MD   3 Units at 12/03/19 0830  . insulin aspart (novoLOG) injection 6 Units  6 Units Subcutaneous Q4H Julian Hy, DO   6 Units at 12/03/19 0829  . insulin glargine (LANTUS) injection 24 Units  24 Units Subcutaneous Daily Erick Colace, NP   24 Units at 12/03/19 1040  . ipratropium-albuterol (DUONEB) 0.5-2.5 (3) MG/3ML nebulizer solution 3 mL  3 mL Nebulization Q6H PRN Kc, Ramesh, MD      . levETIRAcetam (KEPPRA) IVPB 500 mg/100 mL premix  500 mg Intravenous Q12H Jacky Kindle, MD 400 mL/hr at 12/03/19 0920 500 mg at 12/03/19 0920  . lidocaine (LIDODERM) 5 % 1 patch  1 patch Transdermal Daily Lequita Halt, MD   1 patch at 12/03/19 [phone]  . MEDLINE mouth rinse  15 mL Mouth Rinse q12n4p Noemi Chapel P, DO      . metoprolol tartrate (LOPRESSOR) 25 mg/10 mL oral suspension 25 mg  25 mg Per Tube Q8H Erick Colace, NP   25 mg at 12/03/19 0918  . niMODipine (NYMALIZE) 6 MG/ML oral solution 60 mg  60 mg Per Tube Q4H Chand, Currie Paris, MD   60 mg at 12/03/19 0830   Or  . niMODipine (NIMOTOP) capsule 60 mg  60 mg Oral Q4H Chand, Currie Paris, MD      . ondansetron (ZOFRAN) injection 4 mg  4 mg Intravenous Q6H PRN Wynetta Fines T, MD      . rosuvastatin (CRESTOR) tablet 5 mg  5 mg Per Tube Daily Jacky Kindle, MD   5 mg at 12/03/19 0911  . traMADol (ULTRAM) tablet 25 mg  25 mg Per Tube Q6H Erick Colace, NP   25 mg at 12/03/19 0911  . witch hazel-glycerin (TUCKS) pad 1 application  1  application Topical TID Lequita Halt, MD   1 application at 12/09/50 0919     Discharge Medications: Please see discharge summary for a list of discharge medications.  Relevant Imaging Results:  Relevant Lab Results:   Additional Information SSN# [id number]  Joanne Chars, LCSW

## 2019-12-03 NOTE — Progress Notes (Signed)
NAME:  Nicole [name], MRN:  [id number], DOB:  [birth year], LOS: 6 ADMISSION DATE:  11/26/2019, CONSULTATION DATE:  11/28/2019 REFERRING MD:  Dr. Lupita Leash, CHIEF COMPLAINT:  [place]   Brief History   79 year old from rehab on Eliquis for Afib and acute DVT presenting with fever, confusion, leukocytosis admitted to [facility] with concern for sepsis found on [facility] today with acute SAH.    Past Medical History  HTN, HLD, CAD, Afib chronically on Eliquis (not compliant), adenocarcinoma of the lung, DMT2, OA, bronchitis, headaches   Significant Hospital Events   11/3 admitted to [facility] 11/8 completed abx for possible infection (no org specified or clear source) 11/9 fib w/ RVR. Added back BB. Still lethargic. Awaiting angiogram. Cont supportive care w/ goal sbp 100-140, adjusting glycemic control. Cont Nipodipine and keppra prophylaxis. PICC ordered.  Consults:  PCCM NSGY  Procedures:   Significant Diagnostic Tests:  11/3 Vascular LE doppler >>  RIGHT:  - There is no evidence of deep vein thrombosis in the lower extremity.  - No cystic structure found in the popliteal fossa.  LEFT:  - Findings consistent with acute deep vein thrombosis involving the left  common femoral vein, SF junction, left femoral vein, left proximal  profunda vein, left popliteal vein, left posterior tibial veins, and left  peroneal veins.  - No cystic structure found in the popliteal fossa.   11/3 CT chest  >> 1. Constellation of findings likely reflect pulmonary edema versus atypical infection. 2. New small to moderate LEFT pleural effusion. 3. New LEFT and mediastinal supraclavicular lymphadenopathy, nonspecific and possibly reactive in etiology. Recommend attention on follow-up. 4. New 7 mm RIGHT apical nodular versus consolidative opacity. Non-contrast chest CT at 6-12 months is recommended. If the nodule is stable at time of repeat CT, then future CT at 18-24 months (from today's scan) 5. Increased conspicuity of a RIGHT  thyroid nodule in comparison to priors. Recommend nonemergent evaluation with dedicated thyroid ultrasound.  11/4 CTH >> Large amount of subarachnoid hemorrhage is noted throughout the brain, most prominently seen in the basal cisterns and left anterior hemispheric fissure and left Sylvian fissure. Blood is also noted in the posterior horns of both lateral ventricles. Hemorrhage is also noted in the fourth ventricle. These findings are concerning for possible rupture of cerebral aneurysm. Further evaluation with MRA or CTA is recommended.  11/4 CTA head/ neck >> No aneurysm identified. Remains highly suspicious for ruptured aneurysm and catheter angiography is recommended.  Micro Data:  11/3 SARS2 / flu  >> neg 11/3 UC >> multiple species 11/3 BCx2 >> 2/4 methicillin resistant stap epi  Antimicrobials:  11/3 cefepime >> 11/5 11/3 azithromycin >> s/p 5 days 11/5 ceftriaxone s/p 5 days  Interim history/subjective:  Lethargic. Not following commands. Now in AF w/ RVR  Objective   Blood pressure 131/60, pulse 85, temperature 98.7 F (37.1 C), temperature source Axillary, resp. rate (Abnormal) 26, weight 76.1 kg, SpO2 99 %.        Intake/Output Summary (Last 24 hours) at 12/03/2019 0843 Last data filed at 12/03/2019 0530 Gross per 24 hour  Intake 800 ml  Output 1050 ml  Net -250 ml   Filed Weights   12/15/2019 2020  Weight: 76.1 kg   Examination: General 79 year old female she is lethargic in bed. Not following commands. Does yell out when stimulated.  HENT NCAT NGT in place. MM are dry. Her pupils are equal and reactive pulm no accessory use. Room air Card rapid  irreg irreg abd soft not tender + bowel sounds Ext warm. Diffuse areas of ecchymosis. Pitting edema; L>R edema  GU cl yellow Neuro opens eyes. Will yell and grimace to pain.    Resolved Hospital Problem list   AKI Non-anion gap metabolic acidosis SIRSs (completed 5 days PNA no infectious source)  Assessment &  Plan:     Ataumatic subarachnoid hemorrhage-- likely in setting of anticoagulation, s/p kcentra CT angiogram of head showed no aneurysm Plan Cont serial neuro checks SBP goal 100-140 Cont Nimodipine Cont keppra Angiogram either tomorrow or Thursday Cont to monitor TCDs   Afib;  w RVR  Plan Cont CCB Add BB Cont tele Not candidate for [facility] Ensure lytes WNL  Acute DVT of LLE -s/p IVC filter placement  Plan SCD on Right LE. No AC given SAH   Intermittent fluid and electrolyte imbalance Plan Will get chemistry today and in am   DMT2 with uncontrolled hyperglycemia -glycemic control is a little better after adjustment of basal dosing [place] levemir to 24 units Cont tubefeed coverage and ssi Goal glucose is 140-180   L mediastinal supraclavicular lymphadenopathy  R apical nodule vs consolidation, 37mm R thyroid nodule  Hx adenocarcinoma of lung  Plan This will require outpt follow up after her current acute illness has stabilized including repeat CT and [place] of thyroid    Best practice:  Diet: EN  Pain/Anxiety/Delirium protocol (if indicated): PRN tramadol  VAP protocol (if indicated): n/a DVT prophylaxis: Holding off due to subarachnoid hemorrhage GI prophylaxis:n/a Glucose control: Sliding scale Mobility: Out of bed to chair Code Status: Full code Family Communication: brother updated at bedside  Disposition: ICU  My cct 34 min Erick Colace ACNP-BC [place] Pager # [phone] OR # [phone] if no answer

## 2019-12-04 ENCOUNTER — Inpatient Hospital Stay (HOSPITAL_COMMUNITY): Payer: Medicare Other

## 2019-12-04 DIAGNOSIS — I609 Nontraumatic subarachnoid hemorrhage, unspecified: Secondary | ICD-10-CM | POA: Diagnosis not present

## 2019-12-04 LAB — CBC
HCT: 38 % (ref 36.0–46.0)
Hemoglobin: 12.1 g/dL (ref 12.0–15.0)
MCH: 30.6 pg (ref 26.0–34.0)
MCHC: 31.8 g/dL (ref 30.0–36.0)
MCV: 96 fL (ref 80.0–100.0)
Platelets: 449 10*3/uL — ABNORMAL HIGH (ref 150–400)
RBC: 3.96 MIL/uL (ref 3.87–5.11)
RDW: 14.7 % (ref 11.5–15.5)
WBC: 13.2 10*3/uL — ABNORMAL HIGH (ref 4.0–10.5)
nRBC: 0.3 % — ABNORMAL HIGH (ref 0.0–0.2)

## 2019-12-04 LAB — GLUCOSE, CAPILLARY
Glucose-Capillary: 115 mg/dL — ABNORMAL HIGH (ref 70–99)
Glucose-Capillary: 124 mg/dL — ABNORMAL HIGH (ref 70–99)
Glucose-Capillary: 151 mg/dL — ABNORMAL HIGH (ref 70–99)
Glucose-Capillary: 166 mg/dL — ABNORMAL HIGH (ref 70–99)
Glucose-Capillary: 169 mg/dL — ABNORMAL HIGH (ref 70–99)
Glucose-Capillary: 169 mg/dL — ABNORMAL HIGH (ref 70–99)

## 2019-12-04 LAB — COMPREHENSIVE METABOLIC PANEL
ALT: 26 U/L (ref 0–44)
AST: 38 U/L (ref 15–41)
Albumin: 2 g/dL — ABNORMAL LOW (ref 3.5–5.0)
Alkaline Phosphatase: 84 U/L (ref 38–126)
Anion gap: 9 (ref 5–15)
BUN: 31 mg/dL — ABNORMAL HIGH (ref 8–23)
CO2: 23 mmol/L (ref 22–32)
Calcium: 8.8 mg/dL — ABNORMAL LOW (ref 8.9–10.3)
Chloride: 108 mmol/L (ref 98–111)
Creatinine, Ser: 0.58 mg/dL (ref 0.44–1.00)
GFR, Estimated: >60 mL/min (ref >60)
Glucose, Bld: 185 mg/dL — ABNORMAL HIGH (ref 70–99)
Potassium: 4.2 mmol/L (ref 3.5–5.1)
Sodium: 140 mmol/L (ref 135–145)
Total Bilirubin: 0.5 mg/dL (ref 0.3–1.2)
Total Protein: 5.3 g/dL — ABNORMAL LOW (ref 6.5–8.1)

## 2019-12-04 LAB — MAGNESIUM: Magnesium: 2.1 mg/dL (ref 1.7–2.4)

## 2019-12-04 MED ORDER — PROSOURCE TF PO LIQD
45.0000 mL | Freq: Three times a day (TID) | ORAL | Status: DC
  Administered 2019-12-04 – 2019-12-05 (×5): 45 mL
  Filled 2019-12-04 (×5): qty 45

## 2019-12-04 MED ORDER — JUVEN PO PACK
1.0000 | PACK | Freq: Two times a day (BID) | ORAL | Status: DC
  Administered 2019-12-05 (×2): 1
  Filled 2019-12-04 (×4): qty 1

## 2019-12-04 MED ORDER — METOPROLOL TARTRATE 5 MG/5ML IV SOLN: 2.5000 mg | Freq: Four times a day (QID) | INTRAVENOUS | Status: DC

## 2019-12-04 MED ORDER — METOPROLOL TARTRATE 5 MG/5ML IV SOLN: 2.5000 mg | Freq: Four times a day (QID) | INTRAVENOUS | Status: DC | PRN

## 2019-12-04 NOTE — Progress Notes (Signed)
Transcranial Doppler  Date POD PCO2 HCT BP  MCA ACA PCA OPHT SIPH VERT Basilar  11/6 MS/CK     Right  Left   *  *   *  -21   *  *   16  17   38  21   -15  -21   -24      11/8 GC     Right  Left   *  *   *  *   *  *   26  20   24  21    *  *   *      11/10 MS     Right  Left   *  36   *  -20   17  16   19  16   22  18    -18  -12   *            Right  Left                                             Right  Left                                            Right  Left                                            Right  Left                                        MCA = Middle Cerebral Artery      OPHT = Opthalmic Artery     BASILAR = Basilar Artery   ACA = Anterior Cerebral Artery     SIPH = Carotid Siphon PCA = Posterior Cerebral Artery   VERT = Verterbral Artery                   Normal MCA = 62+\-12 ACA = 50+\-12 PCA = 42+\-23   *Unable to insonate due to technical limitations (poor acoustic windows, patient unable to cooperate).  12/04/19- Left Lindegaard ratio= 2.0. Unable to calculate right Lindegaard ratio.  12/04/2019 2:34 PM Kelby Aline., MHA, RVT, RDCS, RDMS

## 2019-12-04 NOTE — Progress Notes (Signed)
NAME:  Nicole [name], MRN:  [id number], DOB:  06/06/1940, LOS: 7 ADMISSION DATE:  12/21/2019, CONSULTATION DATE:  11/28/2019 REFERRING MD:  Dr. Lupita Leash, CHIEF COMPLAINT:  [place]   Brief History   79 year old from rehab on Eliquis for Afib and acute DVT presenting with fever, confusion, leukocytosis admitted to [facility] with concern for sepsis found on [facility] today with acute SAH.    Past Medical History  HTN, HLD, CAD, Afib chronically on Eliquis (not compliant), adenocarcinoma of the lung, DMT2, OA, bronchitis, headaches   Significant Hospital Events   11/3 admitted to [facility] 11/8 completed abx for possible infection (no org specified or clear source) 11/9 fib w/ RVR. Added back BB. Still lethargic. Awaiting angiogram. Cont supportive care w/ goal sbp 100-140, adjusting glycemic control. Cont Nipodipine and keppra prophylaxis. PICC ordered.  Consults:  PCCM NSGY  Procedures:   Significant Diagnostic Tests:  11/3 Vascular LE doppler >>  RIGHT:  - There is no evidence of deep vein thrombosis in the lower extremity.  - No cystic structure found in the popliteal fossa.  LEFT:  - Findings consistent with acute deep vein thrombosis involving the left  common femoral vein, SF junction, left femoral vein, left proximal  profunda vein, left popliteal vein, left posterior tibial veins, and left  peroneal veins.  - No cystic structure found in the popliteal fossa.   11/3 CT chest  >> 1. Constellation of findings likely reflect pulmonary edema versus atypical infection. 2. New small to moderate LEFT pleural effusion. 3. New LEFT and mediastinal supraclavicular lymphadenopathy, nonspecific and possibly reactive in etiology. Recommend attention on follow-up. 4. New 7 mm RIGHT apical nodular versus consolidative opacity. Non-contrast chest CT at 6-12 months is recommended. If the nodule is stable at time of repeat CT, then future CT at 18-24 months (from today's scan) 5. Increased conspicuity of a RIGHT  thyroid nodule in comparison to priors. Recommend nonemergent evaluation with dedicated thyroid ultrasound.  11/4 CTH >> Large amount of subarachnoid hemorrhage is noted throughout the brain, most prominently seen in the basal cisterns and left anterior hemispheric fissure and left Sylvian fissure. Blood is also noted in the posterior horns of both lateral ventricles. Hemorrhage is also noted in the fourth ventricle. These findings are concerning for possible rupture of cerebral aneurysm. Further evaluation with MRA or CTA is recommended.  11/4 CTA head/ neck >>No aneurysm identified. Remains highly suspicious for ruptured aneurysm and catheter angiography is recommended.  Micro Data:  11/3 SARS2 / flu  >> neg 11/3 UC >> multiple species 11/3 BCx2 >> 2/4 methicillin resistant stap epi  Antimicrobials:  11/3 cefepime >> 11/5 11/3 azithromycin >> s/p 5 days 11/5 ceftriaxone s/p 5 days  Interim history/subjective:  No changes. Still really only responds to pain  Objective   Blood pressure 115/66, pulse 89, temperature 98.6 F (37 C), temperature source Axillary, resp. rate (Abnormal) 22, weight 76.1 kg, SpO2 100 %.        Intake/Output Summary (Last 24 hours) at 12/04/2019 0804 Last data filed at 12/04/2019 0600 Gross per 24 hour  Intake 1520 ml  Output 1275 ml  Net 245 ml   Filed Weights   11/30/2019 2020  Weight: 76.1 kg   Examination:  General this is 80 year old female. She remains minimally responsive  HENT Mucous membranes are dry neck veins are flat. Sclera not icteric but does have some scleral edema Pulm. Mouth breathing. She is on room air. No accessory use. Dec  bases Card currently NSR w/out MRG abd soft  Ext LLE edema strong pulses. Brisk CR GU clear yellow  Neuro does not respond to voice. Will not follow commands. Moans to painful stimulus    Resolved Hospital Problem list   AKI Non-anion gap metabolic acidosis SIRSs (completed 5 days PNA no infectious  source)  Assessment & Plan:     Ataumatic subarachnoid hemorrhage-- likely in setting of anticoagulation, s/p kcentra CT angiogram of head showed no aneurysm Plan Serial neuro checks sbp goal 100-140 Cont keppra Cont Nimodipine TCDs Awaiting angiogram either today or tomorrow    Afib;  w RVR ->better after adding back BB  Plan Cont ccb and BB Via tube Cont tele  Daily Mg and chem Not candidate for systemic AC  Acute DVT of LLE -s/p IVC filter placement  Plan SCD on RLE on RLE. No AC given SAH  Intermittent fluid and electrolyte imbalance Plan Trend chemistries   DMT2 with uncontrolled hyperglycemia -glucose w/in goal of 140-180 Plan Cont levemir at 24 units Cont ssi and tube feed coverage   Mild Leukocytosis Plan Trend wbc and fever curve  Sacral pressure ulcer Seen by WOC 11/9->measured 4cmx7cm w/ small 0.2 cm round non-intact area near rectum.  Plan Low air mattress Frequent repositioning Barrier cream  Sacral foam dressing  L mediastinal supraclavicular lymphadenopathy  R apical nodule vs consolidation, 30mm R thyroid nodule  Hx adenocarcinoma of lung  Plan outpt follow up w/ CT chest and US thyroid. Of course will depend on how she does re: her acute illness  Best practice:  Diet: EN  Pain/Anxiety/Delirium protocol (if indicated): PRN tramadol  VAP protocol (if indicated): n/a DVT prophylaxis: Holding off due to subarachnoid hemorrhage GI prophylaxis:n/a Glucose control: Sliding scale Mobility: Out of bed to chair Code Status: Full code Family Communication:none today. Updated daughter in-law 11/9. Angiogram will be helpful for prognostics. We need to discuss goals of care  Disposition: ICU  My cct 31 min Erick Colace ACNP-BC [place] Pager # [phone] OR # [phone] if no answer

## 2019-12-04 NOTE — Progress Notes (Signed)
Nutrition Follow-up  DOCUMENTATION CODES:   Not applicable  INTERVENTION:   Tube feeding via Cortrak tube: Osmolite 1.2 at 60 ml/h (1440 ml per day) Add Prosource TF 45 ml TID  Add -1 packet Juven BID, each packet provides 95 calories, 2.5 grams of protein (collagen), and 9.8 grams of carbohydrate (3 grams sugar); also contains 7 grams of L-arginine and L-glutamine, 300 mg vitamin C, 15 mg vitamin E, 1.2 mcg vitamin B-12, 9.5 mg zinc, 200 mg calcium, and 1.5 g  Calcium Beta-hydroxy-Beta-methylbutyrate to support wound healing   Provides 1848 kcal, 112 gm protein, 1167 ml free water daily   NUTRITION DIAGNOSIS:   Inadequate oral intake related to acute illness, inability to eat as evidenced by NPO status.  Ongoing.   GOAL:   Patient will meet greater than or equal to 90% of their needs  Met with TF.   MONITOR:   Diet advancement, TF tolerance, Weight trends  REASON FOR ASSESSMENT:   Low Braden    ASSESSMENT:   Pt with PMH of recently dx DVT, chronic compression fxs s/p recent kyphoplasty, Afib, HTN, remote lung cancer, IDDM, HLD now admitted from rehab with AMS and sepsis from PNA vs UTI.  Noted pt recently underwent L1 and L2 kyphoplasty and went to [facility] for Rehab.   Pt discussed during ICU rounds and with RN. Per RN wounds appear worse today, consulted WOC.  Plan for angiogram later this week per neurosurgery.    11/03 Admitted with poor po, N/V, fever 11/04 CT head with large SAH, concern for ruptured aneurysm but CT angiogram showed on aneurysm 11/05 Cortrak placed, EEG   Medications reviewed and include: SSI, 6 units novolog every 4 hours, 24 units lantus daily, miralax, senna Labs reviewed:  CBG's: [phone]  Current TF: Osmolite 1.2 @ 60 ml/hr Provides: 1728 kcal and 80 grams protein   Diet Order:   Diet Order            Diet NPO time specified Except for: Sips with Meds  Diet effective now                 EDUCATION NEEDS:   Not  appropriate for education at this time  Skin:  Skin Assessment: Skin Integrity Issues: Skin Integrity Issues:: DTI DTI: buttocks, inner thigh Stage I: NA Incisions: back  Last BM:  11/10 type 7  Height:   Ht Readings from Last 1 Encounters:  11/22/19 5' 6" (1.676 m)    Weight:   Wt Readings from Last 1 Encounters:  11/30/2019 76.1 kg    Ideal Body Weight:  59 kg  BMI:  Body mass index is 27.08 kg/m.  Estimated Nutritional Needs:   Kcal:  1650-1850 kcals  Protein:  95-110 grams  Fluid:  >/= 1.7 L  Heather P., RD, LDN, CNSC See AMiON for contact information   

## 2019-12-04 NOTE — Progress Notes (Signed)
Overall stable.  Patient awake but nonverbal.  Moans and groans.  Semipurposeful with both upper extremities.  Follow-up arteriogram scheduled for later this week.

## 2019-12-04 NOTE — TOC Progression Note (Signed)
Transition of Care [facility]) - Progression Note    Patient Details  Name: Nicole [name] MRN: [id number] Date of Birth: [birth year]  Transition of Care [facility]) CM/SW Contact  Joanne Chars, LCSW Phone Number: 12/04/2019, 10:44 AM  Clinical Narrative:   Phone call from Glade Lloyd, [facility] navig[facility], [phone].  She is from private agency and has been working with the family on getting pt to ALF eventually.     Expected Discharge Plan: [place] Barriers to Discharge: Continued Medical Work up, SNF Pending bed offer  Expected Discharge Plan and Services Expected Discharge Plan: [place] Choice: [place] arrangements for the past 2 months: [place]                                       Social Determinants of Health (SDOH) Interventions    Readmission Risk Interventions No flowsheet data found.

## 2019-12-04 NOTE — Consult Note (Signed)
WOC Nurse Consult Note: Reconsulted for worsening DTPI in evolution by RN who had not seen recently.  Discussed with her by phone and explained that [place] Nurse saw yesterday. Measurements and assessment are updated as of that time.  [place] nursing team did not see today and will not follow, but will remain available to this patient, the nursing and medical teams.  Please re-consult if needed. Thanks, Maudie Flakes, MSN, RN, [place], Arther Abbott  Pager# [phone]

## 2019-12-04 NOTE — Progress Notes (Addendum)
Physical Therapy Treatment Patient Details Name: Nicole [name] MRN: [id number] DOB: Oct 21, 1940 Today's Date: 12/04/2019    History of Present Illness The pt is a 79 yo female presenting with confusion/deliruim. Upon admission, found to have pneumonia and imaging revealed Moderate volume subarachnoid hemorrhage mostly in basal cisterns and left anterior hemispheric fissure. PMH includes: afib, adenocarcinoma stage 3, CAD, DM II, HTN, and back pain.  Cortrak and IVC filter placed 11/5     PT Comments    Pt continues to be encephalopathic with little in the way of localized responses.  Pt did move L UE in response to intentional movement outside of her BOS in sitting when trying to get righting or startle or stability responses.  It was not always purposeful in nature.  Emphasis on transitions, sitting balance/truncal activation and gathering purposeful responses.   Follow Up Recommendations  SNF;Supervision/Assistance - 24 hour     Equipment Recommendations  None recommended by PT    Recommendations for Other Services       Precautions / Restrictions Precautions Precautions: Fall    Mobility  Bed Mobility Overal bed mobility: Needs Assistance Bed Mobility: Supine to Sit;Sit to Supine     Supine to sit: Total assist;+2 for physical assistance Sit to supine: Total assist;+2 for physical assistance   General bed mobility comments: assist coming forward via l elbow,    Transfers                    Ambulation/Gait                 Stairs             Wheelchair Mobility    Modified Rankin (Stroke Patients Only)       Balance Overall balance assessment: Needs assistance Sitting-balance support: Feet supported;Single extremity supported;No upper extremity supported Sitting balance-Leahy Scale: Poor Sitting balance - Comments: pt unable to maintain balance without external assist.  Sat for ~10 min working on truncal activation and getting responses  to stimuli                                    Cognition Arousal/Alertness: Awake/alert Behavior During Therapy: Restless Overall Cognitive Status: Impaired/Different from baseline Area of Impairment: Orientation;Attention;Memory;Following commands;Safety/judgement;Awareness;Problem solving                 Orientation Level: Situation;Time;Place Current Attention Level: Focused   Following Commands: Follows one step commands inconsistently Safety/Judgement: Decreased awareness of safety;Decreased awareness of deficits Awareness: Intellectual Problem Solving: Slow processing;Decreased initiation;Requires verbal cues;Requires tactile cues        Exercises Other Exercises Other Exercises: Again,Warm up U and LE ROM bil, no directed attempts to assist, a few attemps R UE to resist extension    General Comments General comments (skin integrity, edema, etc.): vss      Pertinent Vitals/Pain Pain Assessment: Faces Faces Pain Scale: Hurts even more Pain Location: Right hip/?right flank back Pain Descriptors / Indicators: Grimacing;Guarding (crying out)    Home Living                      Prior Function            PT Goals (current goals can now be found in the care plan section) Acute Rehab PT Goals PT Goal Formulation: With patient/family Time For Goal Achievement: 12/14/19 Potential to Achieve Goals: Fair Progress towards  PT goals: Progressing toward goals    Frequency    Min 3X/week      PT Plan Current plan remains appropriate    Co-evaluation              AM-PAC PT "6 Clicks" Mobility   Outcome Measure  Help needed turning from your back to your side while in a flat bed without using bedrails?: Total Help needed moving from lying on your back to sitting on the side of a flat bed without using bedrails?: Total Help needed moving to and from a bed to a chair (including a wheelchair)?: Total Help needed standing up from a  chair using your arms (e.g., wheelchair or bedside chair)?: Total Help needed to walk in hospital room?: Total Help needed climbing 3-5 steps with a railing? : Total 6 Click Score: 6    End of Session   Activity Tolerance: Patient tolerated treatment well;Patient limited by pain;Other (comment) Patient left: in bed;with call bell/phone within reach;with bed alarm set;with family/visitor present Nurse Communication: Mobility status PT Visit Diagnosis: Other abnormalities of gait and mobility (R26.89);Muscle weakness (generalized) (M62.81);Other symptoms and signs involving the nervous system (R29.898);Pain Pain - Right/Left: Right Pain - part of body: Hip (flank and back)     Time: 7035-0093 PT Time Calculation (min) (ACUTE ONLY): 26 min  Charges:  $Therapeutic Activity: 8-22 mins $Neuromuscular Re-education: 8-22 mins                     12/04/2019  Nicole [name]., PT Acute Rehabilitation Services [phone]  (pager) [phone]  (office)   Nicole [name] 12/04/2019, 2:55 PM

## 2019-12-05 ENCOUNTER — Inpatient Hospital Stay (HOSPITAL_COMMUNITY): Payer: Medicare Other

## 2019-12-05 DIAGNOSIS — I609 Nontraumatic subarachnoid hemorrhage, unspecified: Secondary | ICD-10-CM | POA: Diagnosis not present

## 2019-12-05 LAB — COMPREHENSIVE METABOLIC PANEL
ALT: 44 U/L (ref 0–44)
AST: 66 U/L — ABNORMAL HIGH (ref 15–41)
Albumin: 2 g/dL — ABNORMAL LOW (ref 3.5–5.0)
Alkaline Phosphatase: 85 U/L (ref 38–126)
Anion gap: 11 (ref 5–15)
BUN: 35 mg/dL — ABNORMAL HIGH (ref 8–23)
CO2: 21 mmol/L — ABNORMAL LOW (ref 22–32)
Calcium: 8.7 mg/dL — ABNORMAL LOW (ref 8.9–10.3)
Chloride: 109 mmol/L (ref 98–111)
Creatinine, Ser: 0.55 mg/dL (ref 0.44–1.00)
GFR, Estimated: >60 mL/min (ref >60)
Glucose, Bld: 151 mg/dL — ABNORMAL HIGH (ref 70–99)
Potassium: 4.6 mmol/L (ref 3.5–5.1)
Sodium: 141 mmol/L (ref 135–145)
Total Bilirubin: 0.9 mg/dL (ref 0.3–1.2)
Total Protein: 4.9 g/dL — ABNORMAL LOW (ref 6.5–8.1)

## 2019-12-05 LAB — CBC
HCT: 37.8 % (ref 36.0–46.0)
Hemoglobin: 11.8 g/dL — ABNORMAL LOW (ref 12.0–15.0)
MCH: 30.3 pg (ref 26.0–34.0)
MCHC: 31.2 g/dL (ref 30.0–36.0)
MCV: 96.9 fL (ref 80.0–100.0)
Platelets: 423 10*3/uL — ABNORMAL HIGH (ref 150–400)
RBC: 3.9 MIL/uL (ref 3.87–5.11)
RDW: 14.7 % (ref 11.5–15.5)
WBC: 12.7 10*3/uL — ABNORMAL HIGH (ref 4.0–10.5)
nRBC: 0.2 % (ref 0.0–0.2)

## 2019-12-05 LAB — GLUCOSE, CAPILLARY
Glucose-Capillary: 159 mg/dL — ABNORMAL HIGH (ref 70–99)
Glucose-Capillary: 122 mg/dL — ABNORMAL HIGH (ref 70–99)
Glucose-Capillary: 135 mg/dL — ABNORMAL HIGH (ref 70–99)
Glucose-Capillary: 78 mg/dL (ref 70–99)
Glucose-Capillary: 128 mg/dL — ABNORMAL HIGH (ref 70–99)
Glucose-Capillary: 136 mg/dL — ABNORMAL HIGH (ref 70–99)

## 2019-12-05 MED ORDER — LOPERAMIDE HCL 1 MG/7.5ML PO SUSP
2.0000 mg | Freq: Once | ORAL | Status: AC
  Administered 2019-12-06: 2 mg
  Filled 2019-12-05: qty 15

## 2019-12-05 NOTE — Progress Notes (Signed)
NAME:  Nicole [name], MRN:  [id number], DOB:  [birth year], LOS: 43 ADMISSION DATE:  12/18/2019, CONSULTATION DATE:  11/28/2019 REFERRING MD:  Dr. Lupita Leash, CHIEF COMPLAINT:  [place]   Brief History   79 year old from rehab on Eliquis for Afib and acute DVT presenting with fever, confusion, leukocytosis admitted to [facility] with concern for sepsis found on [facility] today with acute SAH.    Past Medical History  HTN, HLD, CAD, Afib chronically on Eliquis (not compliant), adenocarcinoma of the lung, DMT2, OA, bronchitis, headaches   Significant Hospital Events   11/3 admitted to [facility] 11/8 completed abx for possible infection (no org specified or clear source) 11/9 fib w/ RVR. Added back BB. Still lethargic. Awaiting angiogram. Cont supportive care w/ goal sbp 100-140, adjusting glycemic control. Cont Nipodipine and keppra prophylaxis. PICC ordered.  Consults:  PCCM NSGY  Procedures:   Significant Diagnostic Tests:  11/3 Vascular LE doppler >>  RIGHT:  - There is no evidence of deep vein thrombosis in the lower extremity.  - No cystic structure found in the popliteal fossa.  LEFT:  - Findings consistent with acute deep vein thrombosis involving the left  common femoral vein, SF junction, left femoral vein, left proximal  profunda vein, left popliteal vein, left posterior tibial veins, and left  peroneal veins.  - No cystic structure found in the popliteal fossa.   11/3 CT chest  >> 1. Constellation of findings likely reflect pulmonary edema versus atypical infection. 2. New small to moderate LEFT pleural effusion. 3. New LEFT and mediastinal supraclavicular lymphadenopathy, nonspecific and possibly reactive in etiology. Recommend attention on follow-up. 4. New 7 mm RIGHT apical nodular versus consolidative opacity. Non-contrast chest CT at 6-12 months is recommended. If the nodule is stable at time of repeat CT, then future CT at 18-24 months (from today's scan) 5. Increased conspicuity of a RIGHT  thyroid nodule in comparison to priors. Recommend nonemergent evaluation with dedicated thyroid ultrasound.  11/4 CTH >> Large amount of subarachnoid hemorrhage is noted throughout the brain, most prominently seen in the basal cisterns and left anterior hemispheric fissure and left Sylvian fissure. Blood is also noted in the posterior horns of both lateral ventricles. Hemorrhage is also noted in the fourth ventricle. These findings are concerning for possible rupture of cerebral aneurysm. Further evaluation with MRA or CTA is recommended.  11/4 CTA head/ neck >>No aneurysm identified. Remains highly suspicious for ruptured aneurysm and catheter angiography is recommended. 11/11 CT brain 1. Decreased and dependently accumulating subarachnoid hemorrhage.2. Intraventricular reflux with essentially stable ventriculomegaly. 3. No evidence of infarct. Micro Data:  11/3 SARS2 / flu  >> neg 11/3 UC >> multiple species 11/3 BCx2 >> 2/4 methicillin resistant stap epi  Antimicrobials:  11/3 cefepime >> 11/5 11/3 azithromycin >> s/p 5 days 11/5 ceftriaxone s/p 5 days  Interim history/subjective:   a little more spontaneous movement today. Still not following commands Objective   Blood pressure 122/69, pulse 72, temperature 99 F (37.2 C), temperature source Oral, resp. rate 19, weight 76 kg, SpO2 100 %.        Intake/Output Summary (Last 24 hours) at 12/05/2019 0811 Last data filed at 12/05/2019 0700 Gross per 24 hour  Intake 1732.49 ml  Output 1085 ml  Net 647.49 ml   Filed Weights   12/17/2019 2020 12/05/19 0500  Weight: 76.1 kg 76 kg   Examination:   General this is a 79 year old female who is lying in bed. She is awake,  responds to stimulus by moaning and is moving her left side spont today but she is still not interactive HENT NCAT scleral edema bilaterally pupils are equal and reactive pulm clear non labored. Room air Card reg irreg. afib on tele Neuro as above. Moving left side  a little more spont. Moans and cries out w/ physical stimulation. No real purposeful movement  gu clear yellow via foley cath Ext LLE edema. Pulses are strong. Multiple areas of ecchymosis    Resolved Hospital Problem list   AKI Non-anion gap metabolic acidosis SIRSs (completed 5 days PNA no infectious source)  Assessment & Plan:     Ataumatic subarachnoid hemorrhage-- likely in setting of anticoagulation, s/p kcentra CT angiogram of head showed no aneurysm->f/u CT today w/ decreased SAH Plan Serial neuro checks sbp goal 100-140 Cont keppra and nimodipine Cont TCDs Await angiogram  Will see what angiogram shows. I think we might need to get palliative    Afib;  w RVR ->better after adding back BB  Plan Cont ccb and bb Not candidate for ac Tele   Acute DVT of LLE -s/p IVC filter placement  Plan scds given SAH   Intermittent fluid and electrolyte imbalance Plan trend  DMT2 with uncontrolled hyperglycemia -glucose w/in goal of 140-180 Plan Hold levemir for NPO ssi   Mild Leukocytosis Plan Cont to trend wbc and fever curve   Sacral pressure ulcer Seen by WOC 11/9->measured 4cmx7cm w/ small 0.2 cm round non-intact area near rectum.  Plan Low air mattress Frequent repositioning Barrier cream Sacral foam dressing   L mediastinal supraclavicular lymphadenopathy  R apical nodule vs consolidation, 25mm R thyroid nodule  Hx adenocarcinoma of lung  Plan outpt follow up depending on functional recovery    Best practice:  Diet: EN  Pain/Anxiety/Delirium protocol (if indicated): PRN tramadol  VAP protocol (if indicated): n/a DVT prophylaxis: Holding off due to subarachnoid hemorrhage GI prophylaxis:n/a Glucose control: Sliding scale Mobility: Out of bed to chair Code Status: Full code Family Communication:none today. Updated daughter in-law 11/9. Angiogram will be helpful for prognostics. We need to discuss goals of care  Disposition: ICU  My cct 32  min  Erick Colace ACNP-BC [place] Pager # [phone] OR # [phone] if no answer

## 2019-12-05 NOTE — Progress Notes (Signed)
  Speech Language Pathology Treatment: Dysphagia  Patient Details Name: Nicole [name] MRN: [id number] DOB: 1940-06-21 Today's Date: 12/05/2019 Time:  -     Assessment / Plan / Recommendation Clinical Impression  F/u after initial swallow assessment 11/9.  Daughter, [name], at bedside.  Pt repositioned.  Oral care provided.  Status appears consistent with findings from session on Tuesday.  Pt's eyes are open, does not follow commands.  May occasionally localize to auditory input with brief eye contact.  Did not respond to stimulation at lips/tongue with ice/spoon - no labial seal or effort to manipulate.  Passive, reflexive swallow achieved in response to 1/4 tspn boluses of water (over four trials), not yet functional. Trials ceased. SLP will continue to follow for improvements, readiness for communication eval and instrumental swallow study. D/W [name].   HPI HPI: 79 yo female presenting from [facility] with confusion/deliruim. Upon admission, found to have pneumonia and imaging revealed moderate volume subarachnoid hemorrhage mostly in basal cisterns and left anterior hemispheric fissure. PMH includes: afib, adenocarcinoma stage 3, CAD, DM II, HTN, and back pain.  Cortrak and IVC filter placed 11/5. Pt initially passed RN stroke swallow screen, then presented with AMS and cortrak subsequently placed.       SLP Plan  Continue with current plan of care       Recommendations  Diet recommendations: NPO Medication Administration: Via alternative means                Oral Care Recommendations: Oral care QID SLP Visit Diagnosis: Dysphagia, oropharyngeal phase (R13.12) Plan: Continue with current plan of care       GO                Juan Quam Laurice 12/05/2019, 12:17 PM  Alizandra Loh L. Tivis Ringer, [place] Office number [phone] Pager [phone]

## 2019-12-05 NOTE — Progress Notes (Signed)
Overall unchanged.  Remains awake and minimally aware.  Nonverbal.  Semipurposeful movements bilaterally.  Follow-up head CT scan demonstrates interval partial resolution of her subarachnoid hemorrhage without evidence of new stroke or other structural problem.  She has mild ventriculomegaly but no evidence of severe high pressure hydrocephalus.  Overall stable.  Plan for catheter angiogram per Dr. Kathyrn Sheriff.  No new recommendations from our standpoint.

## 2019-12-05 NOTE — Progress Notes (Signed)
Patient has a sacral wound and is having diarrheaeLink Physician-Brief Progress Note Patient Name: Nicole [name] DOB: August 19, 1940 MRN: [id number]   Date of Service  12/05/2019  HPI/Events of Note  Patient has a sacral wound and is having diarrhea.  eICU Interventions  Imodium 2 mg via NG tube x 1 ordered.        Kerry Kass Debroh Sieloff 12/05/2019, 11:58 PM

## 2019-12-06 ENCOUNTER — Inpatient Hospital Stay (HOSPITAL_COMMUNITY): Payer: Medicare Other

## 2019-12-06 DIAGNOSIS — I609 Nontraumatic subarachnoid hemorrhage, unspecified: Secondary | ICD-10-CM | POA: Diagnosis not present

## 2019-12-06 LAB — POCT I-STAT 7, (LYTES, BLD GAS, ICA,H+H)
Acid-Base Excess: 0 mmol/L (ref 0.0–2.0)
Bicarbonate: 24.4 mmol/L (ref 20.0–28.0)
Calcium, Ion: 1.29 mmol/L (ref 1.15–1.40)
HCT: 42 % (ref 36.0–46.0)
Hemoglobin: 14.3 g/dL (ref 12.0–15.0)
O2 Saturation: 93 %
Patient temperature: 97.9
Potassium: 3.8 mmol/L (ref 3.5–5.1)
Sodium: 142 mmol/L (ref 135–145)
TCO2: 25 mmol/L (ref 22–32)
pCO2 arterial: 37.2 mmHg (ref 32.0–48.0)
pH, Arterial: 7.423 (ref 7.350–7.450)
pO2, Arterial: 63 mmHg — ABNORMAL LOW (ref 83.0–108.0)

## 2019-12-06 LAB — CULTURE, BLOOD (ROUTINE X 2)
Culture: NO GROWTH
Culture: NO GROWTH

## 2019-12-06 LAB — GLUCOSE, CAPILLARY
Glucose-Capillary: 100 mg/dL — ABNORMAL HIGH (ref 70–99)
Glucose-Capillary: 90 mg/dL (ref 70–99)

## 2019-12-06 MED ORDER — DOPAMINE-DEXTROSE 3.2-5 MG/ML-% IV SOLN
0.0000 ug/kg/min | INTRAVENOUS | Status: DC
  Administered 2019-12-06: 5 ug/kg/min via INTRAVENOUS
  Filled 2019-12-06: qty 250

## 2019-12-06 MED ORDER — MORPHINE SULFATE (PF) 2 MG/ML IV SOLN
1.0000 mg | INTRAVENOUS | Status: DC | PRN
  Administered 2019-12-06: 1 mg via INTRAVENOUS
  Administered 2019-12-06: 2 mg via INTRAVENOUS
  Administered 2019-12-06 (×2): 1 mg via INTRAVENOUS
  Administered 2019-12-06 – 2019-12-07 (×5): 2 mg via INTRAVENOUS
  Filled 2019-12-06 (×9): qty 1

## 2019-12-06 NOTE — Progress Notes (Signed)
Called MD Kathyrn Sheriff and updated of patients family decision to go comfort. MD Nundkumar okay with discontinuing TCDs. Vascular called and updated.

## 2019-12-06 NOTE — Progress Notes (Signed)
Family goals of care  CT head results obtained. No change. Now having nausea and vomiting. Hypoxic. Won't tolerate angiogram. I discussed with family (her two sons and daughter) goals of care. We discussed that even if aneurysm was identified we are still dealing with the consequences of a devastating bleed which will leave her functionally impaired for the rest of her life. Surgery would not increase the chances of recovery but would decrease risk of re-bleed. It would require intubation and likely eventually trach. This is not a path the family wishes to go down. They are most concerned about her comfort.  Plan Full DNR Transition to comfort Still get EEG. Need to be sure not actively seizing  PRN morphine Dc angiogram  Dc all meds that do not help w/ comfort.   Erick Colace ACNP-BC [place] Pager # [phone] OR # [phone] if no answer

## 2019-12-06 NOTE — Progress Notes (Signed)
SLP Cancellation Note  Patient Details Name: Nicole [name] MRN: [id number] DOB: 1940-06-26   Cancelled treatment:    Per notes, pt is likely transitioning to a comfort approach to care.  Our services have been discontinued by provider.  We will respectfully sign off.  Analiese Krupka L. Tivis Ringer, [place] CCC/SLP Acute Rehabilitation Services Office number [phone] Pager [phone]        Juan Quam Laurice 12/06/2019, 11:15 AM

## 2019-12-06 NOTE — Procedures (Addendum)
Patient Name: Nicole [name]  MRN: [id number]  Epilepsy Attending: Lora Havens  Referring Physician/Provider: Salvadore Dom, NP Date: 12/06/2019 Duration: 22.55 mins  Patient history: 79 year old female who presented with fever, confusion and was noted to have acute subarachnoid hemorrhage on CT.  EEG to evaluate for seizures.  Level of alertness:  lethargic  AEDs during EEG study: Keppra  Technical aspects: This EEG study was done with scalp electrodes positioned according to the 10-20 International system of electrode placement. Electrical activity was acquired at a sampling rate of 500Hz  and reviewed with a high frequency filter of 70Hz  and a low frequency filter of 1Hz . EEG data were recorded continuously and digitally stored.   Description: No posterior dominant rhythm was noted.  EEG showed continuous generalized polymorphic mixed frequencies with predominantly 3 to 6 Hz theta-delta slowing. Hyperventilation and photic stimulation were not performed.     ABNORMALITY - Continuous slow, generalized  IMPRESSION: This study is suggestive of severe diffuse encephalopathy, nonspecific etiology.  No seizures or epileptiform discharges were seen throughout the recording.  [name]

## 2019-12-06 NOTE — Progress Notes (Signed)
PT Cancellation Note  Patient Details Name: Nicole [name] MRN: [id number] DOB: 07-07-1940   Cancelled Treatment:    Reason Eval/Treat Not Completed: Other (comment)   Will sign off at this time. Pt will be transitioning to comfort care, orders have been discontinued. 12/06/2019  [name], PT Acute Rehabilitation Services [phone]  (pager) [phone]  (office)   Tessie Fass [name] 12/06/2019, 2:03 PM

## 2019-12-06 NOTE — Plan of Care (Signed)
  Problem: Coping: Goal: Level of anxiety will decrease Outcome: Progressing   Problem: Pain Managment: Goal: General experience of comfort will improve Outcome: Progressing   Problem: Safety: Goal: Ability to remain free from injury will improve Outcome: Progressing   Problem: Activity: Goal: Risk for activity intolerance will decrease Outcome: Not Progressing   Problem: Coping: Goal: Will verbalize positive feelings about self Outcome: Not Progressing Goal: Will identify appropriate support needs Outcome: Not Progressing

## 2019-12-06 NOTE — Progress Notes (Signed)
NAME:  Nicole [name], MRN:  [id number], DOB:  1940-11-10, LOS: 9 ADMISSION DATE:  11/30/2019, CONSULTATION DATE:  11/28/2019 REFERRING MD:  Dr. Lupita Leash, CHIEF COMPLAINT:  [place]   Brief History   79 year old from rehab on Eliquis for Afib and acute DVT presenting with fever, confusion, leukocytosis admitted to [facility] with concern for sepsis found on [facility] today with acute SAH.    Past Medical History  HTN, HLD, CAD, Afib chronically on Eliquis (not compliant), adenocarcinoma of the lung, DMT2, OA, bronchitis, headaches   Significant Hospital Events   11/3 admitted to [facility] 11/8 completed abx for possible infection (no org specified or clear source) 11/9 fib w/ RVR. Added back BB. Still lethargic. Awaiting angiogram. Cont supportive care w/ goal sbp 100-140, adjusting glycemic control. Cont Nipodipine and keppra prophylaxis. PICC ordered.  11/12 symptomatic bradycardia w/ associated hypotension. CCB abd BB stopped. New mental status change. Episode of apnea witnessed. STAT CT brain and EEG ordered-> asked family to join at bedside for goals of care Consults:  PCCM NSGY  Procedures:   Significant Diagnostic Tests:  11/3 Vascular LE doppler >>  RIGHT:  - There is no evidence of deep vein thrombosis in the lower extremity.  - No cystic structure found in the popliteal fossa.  LEFT:  - Findings consistent with acute deep vein thrombosis involving the left  common femoral vein, SF junction, left femoral vein, left proximal  profunda vein, left popliteal vein, left posterior tibial veins, and left  peroneal veins.  - No cystic structure found in the popliteal fossa.   11/3 CT chest  >> 1. Constellation of findings likely reflect pulmonary edema versus atypical infection. 2. New small to moderate LEFT pleural effusion. 3. New LEFT and mediastinal supraclavicular lymphadenopathy, nonspecific and possibly reactive in etiology. Recommend attention on follow-up. 4. New 7 mm RIGHT apical nodular versus  consolidative opacity. Non-contrast chest CT at 6-12 months is recommended. If the nodule is stable at time of repeat CT, then future CT at 18-24 months (from today's scan) 5. Increased conspicuity of a RIGHT thyroid nodule in comparison to priors. Recommend nonemergent evaluation with dedicated thyroid ultrasound.  11/4 CTH >> Large amount of subarachnoid hemorrhage is noted throughout the brain, most prominently seen in the basal cisterns and left anterior hemispheric fissure and left Sylvian fissure. Blood is also noted in the posterior horns of both lateral ventricles. Hemorrhage is also noted in the fourth ventricle. These findings are concerning for possible rupture of cerebral aneurysm. Further evaluation with MRA or CTA is recommended.  11/4 CTA head/ neck >>No aneurysm identified. Remains highly suspicious for ruptured aneurysm and catheter angiography is recommended. 11/11 CT brain 1. Decreased and dependently accumulating subarachnoid hemorrhage.2. Intraventricular reflux with essentially stable ventriculomegaly. 3. No evidence of infarct. Micro Data:  11/3 SARS2 / flu  >> neg 11/3 UC >> multiple species 11/3 BCx2 >> 2/4 methicillin resistant stap epi  Antimicrobials:  11/3 cefepime >> 11/5 11/3 azithromycin >> s/p 5 days 11/5 ceftriaxone s/p 5 days  Interim history/subjective:  Symptomatic bradycardia. MS change w/' apnea  Objective   Blood pressure (Abnormal) 72/42, pulse (Abnormal) 51, temperature 97.9 F (36.6 C), temperature source Axillary, resp. rate 17, weight 76 kg, SpO2 100 %.        Intake/Output Summary (Last 24 hours) at 12/06/2019 0823 Last data filed at 12/06/2019 0700 Gross per 24 hour  Intake 1115.9 ml  Output 1175 ml  Net -59.1 ml   [employer]  12/17/2019 2020 12/05/19 0500 12/06/19 0500  Weight: 76.1 kg 76 kg 76 kg   Examination:   General this is a 79 year old female who is lying in bed. She is less responsive.  HENT MM are dry. Sclera are  not icteric but there is scleral edema no JVD pulm crackle bases having witnessed 10-12 second periods of apnea  Card bradycardic irreg w/ rate in 40 to 50s abd soft having liq stools Ext L>R LE edema scattered areas of ecchymosis Neuro less responsive and interactive. Will move left UE to noxious stim GU cl yellow     Resolved Hospital Problem list   AKI Non-anion gap metabolic acidosis SIRSs (completed 5 days PNA no infectious source)  Assessment & Plan:     Ataumatic subarachnoid hemorrhage-- likely in setting of anticoagulation, s/p kcentra CT angiogram of head showed no aneurysm->f/u CT on 11/11  w/ decreased SAH-->now w/ worsening mental status w/ mental status w/ associated apnea  Plan Dc all sedating meds STAT CT brain STAT EEG after back from CT BP goal 100-140 Cont keppra Nimodipine Serial TCDs Angiogram at 3 today (spoke w/ Nundkumar) Spoke w/ Dr Annette Stable made aware of change I have asked family to bedside  Apnea.  This is new->gas exchange acceptable  Plan Supportive care Pulse ox    Symptomatic bradycardia Plan IV dopamine Gentle hydration  Tele  Holding bb and ccb  Afib;  w RVR ->better after adding back BB  Plan Tele Holding bb and ccb w/ hypotension   Acute DVT of LLE -s/p IVC filter placement  Plan scds no AC given sah   Intermittent fluid and electrolyte imbalance Plan Trend and replace   DMT2 with uncontrolled hyperglycemia -glucose w/in goal of 140-180 Plan Hold levemir ssi  Mild Leukocytosis Plan Trend fever and wbc ct  Sacral pressure ulcer Seen by WOC 11/9->measured 4cmx7cm w/ small 0.2 cm round non-intact area near rectum.  Plan Low air mattress  freq repositioning  Barrier cream Sacral foam dressing   L mediastinal supraclavicular lymphadenopathy  R apical nodule vs consolidation, 70mm R thyroid nodule  Hx adenocarcinoma of lung  Plan Op f/u   Best practice:  Diet: EN  Pain/Anxiety/Delirium protocol (if  indicated): PRN tramadol  VAP protocol (if indicated): n/a DVT prophylaxis: Holding off due to subarachnoid hemorrhage GI prophylaxis:n/a Glucose control: Sliding scale Mobility: Out of bed to chair Code Status: Full code Family Communication:none today. Updated daughter in-law 11/9. Angiogram will be helpful for prognostics but may not matter. Have asked family to join me for goals of care discussion.  Disposition: ICU  My cct 43 min  Erick Colace ACNP-BC [place] Pager # [phone] OR # [phone] if no answer

## 2019-12-06 NOTE — Progress Notes (Signed)
OT Cancellation Note and Discharge  Patient Details Name: Nicole [name] MRN: [id number] DOB: [birth year]   Cancelled Treatment:    Reason Eval/Treat Not Completed: Other (comment). Pt is not comfort care, we will sign off.  [place], OTR/L Acute Rehab Services Pager [phone] Office [phone]     Almon Register 12/06/2019, 2:02 PM

## 2019-12-06 NOTE — Progress Notes (Signed)
STAT EEG complete - results pending. ? ?

## 2019-12-06 NOTE — Progress Notes (Signed)
Patient arrived to room 6N15, eyes open but only responsive to pain. Family at bedside, oriented family to room and call light. Foley cath intact, draining clear, yellow urine. Safety precautions in place. Will continue to monitor.

## 2019-12-06 NOTE — Progress Notes (Signed)
Pt HR sustaining in 50s, SBP in the 80s and MAP in the 50s. Called MD Roselie Awkward to inform. New orders placed by MD. WIll continue to monitor.

## 2019-12-06 NOTE — Progress Notes (Signed)
Patient with decline in neurologic status.  Follow-up head CT scan was negative for additional hemorrhage or infarct or significant hydrocephalus.  At this point I can see no surgical interventions to improve her situation.  Apparently family conference is scheduled for later today with regard to limits of care.  I think the sounds like a good idea.

## 2019-12-07 DIAGNOSIS — I609 Nontraumatic subarachnoid hemorrhage, unspecified: Secondary | ICD-10-CM | POA: Diagnosis not present

## 2019-12-07 MED ORDER — LORAZEPAM 2 MG/ML PO CONC: 1.0000 mg | ORAL | Status: DC | PRN

## 2019-12-07 MED ORDER — MORPHINE SULFATE (CONCENTRATE) 10 MG/0.5ML PO SOLN: 5.0000 mg | ORAL | Status: DC | PRN

## 2019-12-07 MED ORDER — LORAZEPAM 1 MG PO TABS: 1.0000 mg | ORAL_TABLET | ORAL | Status: DC | PRN

## 2019-12-07 MED ORDER — LORAZEPAM 2 MG/ML IJ SOLN
1.0000 mg | INTRAMUSCULAR | Status: DC | PRN
  Administered 2019-12-07: 1 mg via INTRAVENOUS
  Filled 2019-12-07: qty 1

## 2019-12-07 MED ORDER — MORPHINE BOLUS VIA INFUSION
1.0000 mg | INTRAVENOUS | Status: DC | PRN
  Filled 2019-12-07: qty 1

## 2019-12-07 MED ORDER — SCOPOLAMINE 1 MG/3DAYS TD PT72
1.0000 | MEDICATED_PATCH | TRANSDERMAL | Status: DC
  Administered 2019-12-07: 1.5 mg via TRANSDERMAL
  Filled 2019-12-07: qty 1

## 2019-12-07 MED ORDER — MORPHINE 100MG IN NS 100ML (1MG/ML) PREMIX INFUSION
1.0000 mg/h | INTRAVENOUS | Status: DC
  Administered 2019-12-07: 3 mg/h via INTRAVENOUS
  Filled 2019-12-07: qty 100

## 2019-12-07 MED ORDER — ATROPINE SULFATE 1 % OP SOLN
4.0000 [drp] | OPHTHALMIC | Status: DC | PRN
  Administered 2019-12-07 – 2019-12-08 (×2): 4 [drp] via SUBLINGUAL
  Filled 2019-12-07 (×2): qty 2

## 2019-12-07 NOTE — Progress Notes (Signed)
NAME:  Nicole [name], MRN:  [id number], DOB:  02/28/1940, LOS: 61 ADMISSION DATE:  12/09/2019, CONSULTATION DATE:  11/28/2019 REFERRING MD:  Dr. Lupita Leash, CHIEF COMPLAINT:  [place]   Brief History   79 year old from rehab on Eliquis for Afib and acute DVT presenting with fever, confusion, leukocytosis admitted to [facility] with concern for sepsis found on [facility] today with acute SAH.    Past Medical History  HTN, HLD, CAD, Afib chronically on Eliquis (not compliant), adenocarcinoma of the lung, DMT2, OA, bronchitis, headaches   Significant Hospital Events   11/3 admitted to [facility] 11/4 head CT with large amount of subarachnoid hemorrhage noted throughout the brain 11/8 completed abx for possible infection (no org specified or clear source) 11/9 fib w/ RVR. Added back BB. Still lethargic. Awaiting angiogram. Cont supportive care w/ goal sbp 100-140, adjusting glycemic control. Cont Nipodipine and keppra prophylaxis. PICC ordered.  11/12 symptomatic bradycardia w/ associated hypotension. CCB and BB stopped. New mental status change. Episode of apnea witnessed. STAT CT brain with unchanged moderate volume subarachnoid hemorrhage, EEG negative-> asked family to join at bedside for goals of care  Consults:  PCCM NSGY  Procedures:   Significant Diagnostic Tests:  11/3 Vascular LE doppler >  negative DVT right, left acute deep vein thrombosis involving left common femoral artery, SF junction, left femoral vein, left proximal profunda vein, left popliteal vein, left posterior tibial veins, and left  peroneal veins.   11/3 CT chest  >> Constellation of findings likely reflect pulmonary edema versus atypical infection. New small to moderate LEFT pleural effusion.  New LEFT and mediastinal supraclavicular lymphadenopathy, nonspecific and possibly reactive in etiology.  11/4 CTH >> Large amount of subarachnoid hemorrhage is noted throughout the brain, most prominently seen in the basal cisterns and left anterior  hemispheric fissure and left Sylvian fissure. Blood is also noted in the posterior horns of both lateral ventricles. Hemorrhage is also noted in the fourth ventricle.   11/4 CTA head/ neck >> No aneurysm identified. Remains highly suspicious for ruptured aneurysm and catheter angiography is recommended.  11/11 CT brain  >> Decreased and dependently accumulating subarachnoid hemorrhage. Intraventricular reflux with essentially stable ventriculomegaly. No evidence of infarct.  Micro Data:  11/3 SARS2 / flu  >> neg 11/3 UC >> multiple species 11/3 BCx2 >> 2/4 methicillin resistant stap epi  Antimicrobials:  11/3 cefepime >> 11/5 11/3 azithromycin >> s/p 5 days 11/5 ceftriaxone s/p 5 days  Interim history/subjective:  Patient is seen sleeping comfortably in bed with snoring respirations Family at bedside and updated regarding plan of care  Objective   Blood pressure 110/74, pulse 75, temperature (!) 97.4 F (36.3 C), temperature source Axillary, resp. rate 16, weight 76 kg, SpO2 97 %.        Intake/Output Summary (Last 24 hours) at 12/07/2019 0819 Last data filed at 12/07/2019 4174 Gross per 24 hour  Intake 152.28 ml  Output 925 ml  Net -772.72 ml   Filed Weights   12/11/2019 2020 12/05/19 0500 12/06/19 0500  Weight: 76.1 kg 76 kg 76 kg   Examination: General: Chronically ill appearing elderly female lying in bed sleeping with snoring respirations HEENT: [place]/AT, MM pink/moist, PERRL,  Neuro: Unresponsive with snoring respirations CV: s1s2 regular rate and rhythm, no murmur, rubs, or gallops,  PULM: Deferred as family request she will be allowed to rest GI: soft, bowel sounds active in all 4 quadrants, non-tender, non-distended Extremities: warm/dry, no visible edema  Skin: no rashes or  lesions  Resolved Hospital Problem list   AKI Non-anion gap metabolic acidosis SIRSs (completed 5 days PNA no infectious source)  Assessment & Plan:   Ataumatic subarachnoid  hemorrhage- -likely in setting of anticoagulation, s/p kcentra -CT angiogram of head showed no aneurysm Apnea Symptomatic bradycardia Afib;  w RVR  Acute DVT of LLE Intermittent fluid and electrolyte imbalance DMT2 with uncontrolled hyperglycemia Mild Leukocytosis Sacral pressure ulcer -Seen by [facility] 11/9->measured 4cmx7cm w/ small 0.2 cm round non-intact area near rectum.  L mediastinal supraclavicular lymphadenopathy  R apical nodule vs consolidation, 35mm R thyroid nodule  Hx adenocarcinoma of lung   Plan: Care discussion held with family with Salvadore Dom 11/12 family was updated regarding treatment options.  Neurosurgery was consulted and there was no viable surgical interventions to assist in care.  It was determined at that time that team's main focus would be comfort care.  Patient was transition to DO NOT RESUSCITATE evening of 11/12 and transferred to palliative care for.  On a.m. assessment she appears comfortable with snoring respirations and family at bedside.  Discussed need for discharge disposition planning as patient may remain in this state for days to weeks.  Family will discuss options for discharge including possible discharge home with hospice versus residential hospice.  Transition of care team consulted for assistance in discharge planning.   Best practice:  Diet: EN  Pain/Anxiety/Delirium protocol (if indicated): PRN tramadol  VAP protocol (if indicated): n/a DVT prophylaxis: Holding off due to subarachnoid hemorrhage GI prophylaxis:n/a Glucose control: Sliding scale Mobility: Out of bed to chair Code Status: Full code Family Communication:none today.  Family updated at bedside 11/13 Disposition: Palliative floor  Signature Johnsie Cancel, NP-C [place] Contact / Pager information can be found on Amion  12/07/2019, 8:28 AM

## 2019-12-07 NOTE — Consult Note (Signed)
Received referral for home hospice services from [place], [place]. Patient will need CADD pump at home. After speaking to son, Jonni Sanger, the plan is for patient to be discharged Monday after pump has been delivered.  Hospital bed will be ordered delivered Monday am. Family is planning on hiring full time caregivers for patient.  Liason will follow up over weekend and Monday for discharge planning.

## 2019-12-07 NOTE — Progress Notes (Signed)
Received call and pt's family wants pt to go to [facility]. Met with daughter-in-law and she reports that they have some concern about her care if they take her home. They prefer for pt to go to a hospice facility. Viviana Simpler, NP, Pat at hospice, and [place], [place] of pt's D/C plan.

## 2019-12-07 NOTE — Progress Notes (Addendum)
Received call from [facility] with hospice. She asked if pt is going home on a drip or oral Morphine. Fraser Din reports that they need to order a Cadd pump if pt is D/C on a drip and it may not be delivered until Monday. She reports that pt's son is not sure if they want pt to be D/C on a drip. Notified Merlene Laughter, NP of above. Will continue to f/u to assist with the D/C plan.

## 2019-12-07 NOTE — Progress Notes (Signed)
Received referral to assist with home with hospice vs residential hospice. Met with pt's son Mitzi Hansen). He reports that he shares [facility] POA with his sister [name]) and brother. He reports that they discussed the D/C options and they prefer for her to go home with hospice. He reports that he doesn't have a preference for a hospice agency. Provided son with the names of hospice agencies and he agreed to use [facility]. He reports that she needs a hospital bed. She is not on O2. Contacted Pat with [facility] for the referral. Fraser Din reports that she is not sure if they can admit pt tomorrow, but she will f/u. Will continue to f/u to assist with the D/C plan.

## 2019-12-09 ENCOUNTER — Encounter (HOSPITAL_COMMUNITY): Payer: Medicare Other

## 2019-12-14 ENCOUNTER — Other Ambulatory Visit: Payer: Self-pay | Admitting: Internal Medicine

## 2019-12-14 DIAGNOSIS — E118 Type 2 diabetes mellitus with unspecified complications: Secondary | ICD-10-CM

## 2019-12-25 NOTE — Progress Notes (Signed)
   2019/12/27 0135  Attending [place]  Attending Physician Notified Y  Attending Physician (First and Last Name) Boone Master  Will the above attending physician sign death certificate? No  Physician (First and Last Name) Who Will Sign Death Certificate Simonne Maffucci  Post Mortem Checklist  Date of Death 12-27-19  Time of Death 0130  Pronounced By Haze Rushing   Next of kin notified Yes  Name of next of kin notified of death Pammie Chirino  Contact Person's Relationship to Patient Daughter  Contact Person's Phone Number [phone]  Contact Person's address [address] [place], [phone]  Was the patient a No Code Blue or a Limited Code Blue? Yes  Did the patient die unattended? No  Patient restrained? Not applicable  Height 5\' 6"  (1.676 m)  Weight 76 kg  [place] [facility]  Notification Date 12/27/2019  Notification Time [place] Donor Service Number [phone]  Is patient a potential donor? Y  Donation Type Tissue  Autopsy  Autopsy requested by N/A  Patient and Hospital Property Returned  Patient belongings from bedside/safe/pharmacy returned  Yes  Valuables returned to?  (Daughter at bedside, will take belongings )  Specify valuables returned Hat(purple), scarf (purple)  Dermatherapy linen/gowns NOT sent with patient or transporter Not applicable  Dead on Arrival (Emergency Department)  Patient dead on arrival? No  Notifications  Patient Placement notified that Post Mortem checklist is complete Yes  Medical Examiner  Is this a medical examiner's case? [place] home name/address/phone # [place] - [address] [place]  Planned location of [place]

## 2019-12-25 DEATH — deceased

## 2020-01-02 ENCOUNTER — Ambulatory Visit: Payer: Medicare Other | Admitting: Physician Assistant

## 2020-01-25 NOTE — Death Summary Note (Addendum)
DEATH SUMMARY   Patient Details  Name: Nicole [name] MRN: [id number] DOB: 09-05-1940  Admission/Discharge Information   Admit Date:  2019/12/24  Date of Death: Date of Death: 01/04/2020  Time of Death: Time of Death: 0130  Length of Stay: May 01, 2022  Referring Physician: [name], MD   Reason(s) for Hospitalization  SAH  Diagnoses  Preliminary cause of death: SAH Secondary Diagnoses (including complications and co-morbidities):  Subarachnoid hemorrhage Atrial fibrillation Pressure injury of skin CAD Hyperlipidemia Lung adenocarcinoma DM2 OA Chronic Bronchitis   Brief Hospital Course (including significant findings, care, treatment, and services provided and events leading to death)  80 year old from rehab on Eliquis for Afib and acute DVT presenting with fever, confusion, leukocytosis admitted to [facility] with concern for sepsis found on [facility] today with acute SAH.    Past Medical History  HTN, HLD, CAD, Afib chronically on Eliquis (not compliant), adenocarcinoma of the lung, DMT2, OA, bronchitis, headaches   Significant Hospital Events   12-24-2022 admitted to [facility] 11/8 completed abx for possible infection (no org specified or clear source) 11/9 fib w/ RVR. Added back BB. Still lethargic. Awaiting angiogram. Cont supportive care w/ goal sbp 100-140, adjusting glycemic control. Cont Nipodipine and keppra prophylaxis. PICC ordered.  11/12 symptomatic bradycardia w/ associated hypotension. CCB abd BB stopped. New mental status change. Episode of apnea witnessed. STAT CT brain and EEG ordered-> asked family to join at bedside for goals of care  The patient's mental status did not improved with supportive, conservative management.  We discussed her overall declining condition over the last several months nd they elected to change to comfort measures.     Pertinent Labs and Studies  Significant Diagnostic Studies CT HEAD WO CONTRAST  Result Date: 12/06/2019 CLINICAL DATA:  Mental  status change, unknown cause. Additional provided: Patient less responsive to painful stimuli today. EXAM: CT HEAD WITHOUT CONTRAST TECHNIQUE: Contiguous axial images were obtained from the base of the skull through the vertex without intravenous contrast. COMPARISON:  Head CT examinations 12/05/2019 and earlier. CT angiogram head/neck 11/28/2019. Brain MRI 08/06/2016. FINDINGS: Brain: Mild generalized cerebral atrophy. Moderate volume subarachnoid hemorrhage, greatest overlying the posterior cerebral hemispheres bilaterally, not significantly changed as compared to the head CT performed 1 day prior 12/05/2019. Also unchanged, there is small volume intraventricular hemorrhage dependently within both lateral ventricles. Unchanged mild ventriculomegaly. No interval intracranial hemorrhage is identified. No demarcated cortical infarct. Mild ill-defined hypoattenuation within the cerebral white matter is nonspecific, but compatible with chronic small vessel ischemic disease. No evidence of intracranial mass. No midline shift. Vascular: No hyperdense vessel.  Atherosclerotic calcifications. Skull: Normal. Negative for fracture or focal suspicious lesion. Sinuses/Orbits: Visualized orbits show no acute finding. Trace ethmoid sinus mucosal thickening. IMPRESSION: Unchanged moderate-volume subarachnoid hemorrhage, greatest overlying the posterior cerebral hemispheres. Unchanged small-volume intraventricular hemorrhage and mild ventriculomegaly. No evidence of acute infarct. Stable background mild cerebral atrophy and chronic small vessel ischemic disease. Electronically Signed   By: Kellie Simmering DO   On: 12/06/2019 09:34   CT HEAD WO CONTRAST  Result Date: 12/05/2019 CLINICAL DATA:  Subarachnoid hemorrhage EXAM: CT HEAD WITHOUT CONTRAST TECHNIQUE: Contiguous axial images were obtained from the base of the skull through the vertex without intravenous contrast. COMPARISON:  Six days ago FINDINGS: Brain: Subarachnoid  hemorrhage has accumulated along the posterior sulci. No evidence of interval increase. Globular hemorrhage about the upper basilar is unchanged. Intraventricular reflux seen to a similar degree. There is ventriculomegaly which is essentially stable from prior.  Periventricular low-density is stable. No visible cortex infarct. Vascular: No interval change. Skull: Negative Sinuses/Orbits: Negative IMPRESSION: 1. Decreased and dependently accumulating subarachnoid hemorrhage. 2. Intraventricular reflux with essentially stable ventriculomegaly. 3. No evidence of infarct. Electronically Signed   By: Monte Fantasia M.D.   On: 12/05/2019 07:08   CT HEAD WO CONTRAST  Result Date: 12/23/2019 CLINICAL DATA:  Follow-up examination for subarachnoid hemorrhage. EXAM: CT HEAD WITHOUT CONTRAST TECHNIQUE: Contiguous axial images were obtained from the base of the skull through the vertex without intravenous contrast. COMPARISON:  Prior CT from 11/28/2019. FINDINGS: Brain: Moderate volume subarachnoid hemorrhage is overall slightly decreased in conspicuous as compared to previous exam. Preponderance of the blood again seen within the prepontine cistern with extension into the left sylvian fissure. Small volume intraventricular blood consistent with redistribution. Associated ventriculomegaly is not significantly changed without worsening hydrocephalus. Basilar cisterns remain patent with no midline shift or significant mass effect. No interval large vessel territory infarct. No extra-axial collection. Vascular: Largely obscured by subarachnoid hemorrhage. Skull: Scalp soft tissues and calvarium within normal limits and unchanged. Sinuses/Orbits: Globes and orbital soft tissues demonstrate no acute finding. Paranasal sinuses and mastoid air cells remain clear. Other: None. IMPRESSION: 1. Moderate volume subarachnoid hemorrhage, overall slightly decreased as compared to previous exam. 2. Small volume intraventricular blood  consistent with redistribution. Associated ventriculomegaly is not significantly changed without worsening hydrocephalus. 3. No other new acute intracranial abnormality. Electronically Signed   By: Jeannine Boga M.D.   On: 11/28/2019 02:46   CT HEAD WO CONTRAST  Addendum Date: 11/28/2019   ADDENDUM REPORT: 11/28/2019 12:22 ADDENDUM: Critical Value/emergent results were called by telephone at the time of interpretation on 11/28/2019 at 12:22 pm to provider [facility] , who verbally acknowledged these results. Electronically Signed   By: Marijo Conception M.D.   On: 11/28/2019 12:22   Result Date: 11/28/2019 CLINICAL DATA:  Delirium. EXAM: CT HEAD WITHOUT CONTRAST TECHNIQUE: Contiguous axial images were obtained from the base of the skull through the vertex without intravenous contrast. COMPARISON:  None. FINDINGS: Brain: Large amount of subarachnoid hemorrhage is noted throughout the brain, most prominently seen in the basal cisterns and left anterior hemispheric fissure and left sylvian fissure. Blood is also noted in the posterior horns of both lateral ventricles. Hemorrhage is also noted in the fourth ventricle. No ventricular dilatation is noted. No midline shift is noted. Subarachnoid hemorrhage is also noted in the para falcine and posterior parietal regions bilaterally. These findings are concerning for possible rupture of cerebral aneurysm. Vascular: No hyperdense vessel or unexpected calcification. Skull: Normal. Negative for fracture or focal lesion. Sinuses/Orbits: No acute finding. Other: None. IMPRESSION: Large amount of subarachnoid hemorrhage is noted throughout the brain, most prominently seen in the basal cisterns and left anterior hemispheric fissure and left Sylvian fissure. Blood is also noted in the posterior horns of both lateral ventricles. Hemorrhage is also noted in the fourth ventricle. These findings are concerning for possible rupture of cerebral aneurysm. Further evaluation with  MRA or CTA is recommended. Electronically Signed: By: Marijo Conception M.D. On: 11/28/2019 12:19   IR IVC FILTER PLMT / S&I Burke Keels GUID/MOD SED  Result Date: 12/01/2019 INDICATION: SUBARACHNOID HEMORRHAGE, LEFT LOWER EXTREMITY DVT, PATIENT CAN LONGER BE ANTICOAGULATED EXAM: ULTRASOUND GUIDANCE FOR VASCULAR ACCESS IVC CATHETERIZATION AND VENOGRAM IVC FILTER INSERTION Date:  12/18/2019 12/11/2019 2:38 pm Radiologist:  Jerilynn Mages. Daryll Brod, MD Guidance:  Ultrasound and fluoroscopic CONTRAST:  50 cc omni 300 MEDICATIONS: 1% lidocaine local ANESTHESIA/SEDATION:  0 mg IV Versed; 12.5 mcg IV Fentanyl Moderate Sedation Time:  0 minutes The patient was continuously monitored during the procedure by the interventional radiology nurse under my direct supervision. FLUOROSCOPY TIME:  Fluoroscopy Time: 1 minutes 18 seconds (35 mGy). COMPLICATIONS: None immediate. PROCEDURE: Informed consent was obtained from the patient following explanation of the procedure, risks, benefits and alternatives. The patient understands, agrees and consents for the procedure. All questions were addressed. A time out was performed. Maximal barrier sterile technique utilized including caps, mask, sterile gowns, sterile gloves, large sterile drape, hand hygiene, and betadine prep. Under sterile condition and local anesthesia, right internal jugular venous access was performed with ultrasound. Over a guide wire, the IVC filter delivery sheath and inner dilator were advanced into the IVC just above the IVC bifurcation. Contrast injection was performed for an IVC venogram. IVC VENOGRAM: The IVC is patent. No evidence of thrombus, stenosis, or occlusion. No variant venous anatomy. The renal veins are identified at L1. IVC FILTER INSERTION: Through the delivery sheath, the Bard Denali IVC filter was deployed in the infrarenal IVC at the L2-3 level just below the renal veins and above the IVC bifurcation. Contrast injection confirmed position. There is good  apposition of the filter against the IVC. The delivery sheath was removed and hemostasis was obtained with compression for 5 minutes. The patient tolerated the procedure well. No immediate complications. IMPRESSION: Ultrasound and fluoroscopically guided infrarenal IVC filter insertion. PLAN: Due to patient related comorbidities and/or clinical necessity, this IVC filter should be considered a permanent device. This patient will not be actively followed for future filter retrieval. Electronically Signed   By: Jerilynn Mages.  Shick M.D.   On: 11/25/2019 14:50   DG Abd Portable 1V  Result Date: 12/14/2019 CLINICAL DATA:  Evaluate feeding tube placement EXAM: PORTABLE ABDOMEN - 1 VIEW COMPARISON:  None FINDINGS: Interval placement of feeding tube. The tube tip is well below the level of the GE junction. The tube appears looped within the gastric body and the tip is in the projection of the gastric fundus. IMPRESSION: Feeding tube tip is in the gastric fundus. Electronically Signed   By: Kerby Moors M.D.   On: 12/12/2019 11:51   EEG adult  Result Date: 12/06/2019 Lora Havens, MD     12/06/2019 11:39 AM Patient Name: KEIASHA DIEP MRN: [id number] Epilepsy Attending: Lora Havens Referring Physician/Provider: Salvadore Dom, NP Date: 12/06/2019 Duration: 22.55 mins Patient history: 80 year old female who presented with fever, confusion and was noted to have acute subarachnoid hemorrhage on CT.  EEG to evaluate for seizures. Level of alertness:  lethargic AEDs during EEG study: Keppra Technical aspects: This EEG study was done with scalp electrodes positioned according to the 10-20 International system of electrode placement. Electrical activity was acquired at a sampling rate of 500Hz  and reviewed with a high frequency filter of 70Hz  and a low frequency filter of 1Hz . EEG data were recorded continuously and digitally stored. Description: No posterior dominant rhythm was noted.  EEG showed continuous generalized  polymorphic mixed frequencies with predominantly 3 to 6 Hz theta-delta slowing. Hyperventilation and photic stimulation were not performed.    ABNORMALITY - Continuous slow, generalized  IMPRESSION: This study is suggestive of severe diffuse encephalopathy, nonspecific etiology. No seizures or epileptiform discharges were seen throughout the recording.  Lora Havens   EEG adult  Result Date: 11/28/2019 Lora Havens, MD     11/30/2019 12:14 PM Patient Name: NITZA SCHMID MRN: [id number] Epilepsy Attending:  Lora Havens Referring Physician/Provider: Dr Jacky Kindle Date: 12/07/2019 Duration: 24.20 mins Patient history: 80yo F presented with ams and found to have Talent. Now intermittently following commands. EEG to evaluate for seizure Level of alertness: Lethargic AEDs during EEG study: LEV Technical aspects: This EEG study was done with scalp electrodes positioned according to the 10-20 International system of electrode placement. Electrical activity was acquired at a sampling rate of 500Hz  and reviewed with a high frequency filter of 70Hz  and a low frequency filter of 1Hz . EEG data were recorded continuously and digitally stored. Description: No posterior dominant rhythm was noted.  EEG showed continuous generalized 3 to 6 Hz theta-delta slowing.  Hyperventilation and photic stimulation were not performed.   ABNORMALITY - Continuous slow, generalized IMPRESSION: This study is suggestive of moderate diffuse encephalopathy, nonspecific etiology. No seizures or epileptiform discharges were seen throughout the recording. Lora Havens   ECHOCARDIOGRAM COMPLETE  Result Date: 11/28/2019    ECHOCARDIOGRAM REPORT   Patient Name:   DORINDA STEHR Date of Exam: 11/28/2019 Medical Rec #:  [id number]      Height:       66.0 in Accession #:    [id number]     Weight:       167.8 lb Date of Birth:  Apr 17, 1940     BSA:          1.856 m Patient Age:    89 years       BP:           130/80 mmHg Patient Gender: F               HR:           84 bpm. Exam Location:  Inpatient Procedure: 2D Echo Indications:    atrial fibrillation 427.31  History:        Patient has prior history of Echocardiogram examinations, most                 recent 10/05/2016. CAD, Arrythmias:Atrial Fibrillation and Atrial                 Flutter; Risk Factors:Diabetes and Hypertension.  Sonographer:    Jannett Celestine RDCS (AE) Referring Phys: [id number] [facility]  Sonographer Comments: restricted mobility due to lumbar injury. patient very sensitive to pain and unable to remain still at times as a result. off axis apical windows, unable to reposition IMPRESSIONS  1. Left ventricular ejection fraction, by estimation, is 60 to 65%. The left ventricle has normal function. The left ventricle has no regional wall motion abnormalities. There is mild concentric left ventricular hypertrophy. Left ventricular diastolic function could not be evaluated.  2. Right ventricular systolic function is normal. The right ventricular size is normal. There is normal pulmonary artery systolic pressure. The estimated right ventricular systolic pressure is 37.6 mmHg.  3. Left atrial size was moderately dilated.  4. Right atrial size was mildly dilated.  5. The mitral valve is normal in structure. Mild mitral valve regurgitation. No evidence of mitral stenosis. Moderate mitral annular calcification.  6. The aortic valve is normal in structure. There is mild calcification of the aortic valve. There is mild thickening of the aortic valve. Aortic valve regurgitation is mild. Mild to moderate aortic valve sclerosis/calcification is present, without any evidence of aortic stenosis.  7. The inferior vena cava is normal in size with greater than 50% respiratory variability, suggesting right atrial pressure of 3 mmHg. FINDINGS  Left Ventricle: Left ventricular ejection  fraction, by estimation, is 60 to 65%. The left ventricle has normal function. The left ventricle has no regional wall motion  abnormalities. The left ventricular internal cavity size was normal in size. There is  mild concentric left ventricular hypertrophy. Left ventricular diastolic function could not be evaluated due to atrial fibrillation. Left ventricular diastolic function could not be evaluated. Right Ventricle: The right ventricular size is normal. No increase in right ventricular wall thickness. Right ventricular systolic function is normal. There is normal pulmonary artery systolic pressure. The tricuspid regurgitant velocity is 2.97 m/s, and  with an assumed right atrial pressure of 3 mmHg, the estimated right ventricular systolic pressure is 93.7 mmHg. Left Atrium: Left atrial size was moderately dilated. Right Atrium: Right atrial size was mildly dilated. Pericardium: There is no evidence of pericardial effusion. Mitral Valve: The mitral valve is normal in structure. There is mild thickening of the mitral valve leaflet(s). There is mild calcification of the mitral valve leaflet(s). Moderate mitral annular calcification. Mild mitral valve regurgitation. No evidence of mitral valve stenosis. Tricuspid Valve: The tricuspid valve is normal in structure. Tricuspid valve regurgitation is mild . No evidence of tricuspid stenosis. Aortic Valve: The aortic valve is normal in structure. There is mild calcification of the aortic valve. There is mild thickening of the aortic valve. Aortic valve regurgitation is mild. Mild to moderate aortic valve sclerosis/calcification is present, without any evidence of aortic stenosis. Pulmonic Valve: The pulmonic valve was normal in structure. Pulmonic valve regurgitation is not visualized. No evidence of pulmonic stenosis. Aorta: The aortic root is normal in size and structure. Venous: The inferior vena cava is normal in size with greater than 50% respiratory variability, suggesting right atrial pressure of 3 mmHg. IAS/Shunts: No atrial level shunt detected by color flow Doppler.  LEFT VENTRICLE PLAX  2D LVIDd:         2.70 cm LVIDs:         2.07 cm LV PW:         1.10 cm LV IVS:        1.30 cm LVOT diam:     1.90 cm LV SV:         50 LV SV Index:   27 LVOT Area:     2.84 cm  LEFT ATRIUM             Index       RIGHT ATRIUM           Index LA diam:        4.60 cm 2.48 cm/m  RA Area:     11.90 cm LA Vol (A2C):   49.8 ml 26.83 ml/m RA Volume:   24.50 ml  13.20 ml/m LA Vol (A4C):   48.6 ml 26.19 ml/m LA Biplane Vol: 49.4 ml 26.62 ml/m  AORTIC VALVE LVOT Vmax:   90.70 cm/s LVOT Vmean:  68.400 cm/s LVOT VTI:    0.175 m  AORTA Ao Root diam: 2.80 cm MITRAL VALVE                TRICUSPID VALVE MV Area (PHT): 3.68 cm     TR Peak grad:   35.3 mmHg MV Decel Time: 206 msec     TR Vmax:        297.00 cm/s MV E velocity: 138.00 cm/s                             SHUNTS  Systemic VTI:  0.18 m                             Systemic Diam: 1.90 cm Ena Dawley MD Electronically signed by Ena Dawley MD Signature Date/Time: 11/28/2019/4:26:01 PM    Final    VAS [place] TRANSCRANIAL DOPPLER  Result Date: 12/05/2019  Transcranial Doppler Indications: Subarachnoid hemorrhage. History: Diabetes mellitus, hypertension, hypertriglyceridemia, coronary artery disease. Limitations: Poor acoustic windows Comparison Study: 12/02/2019- TCD Performing Technologist: Maudry Mayhew MHA, RDMS, RVT, RDCS  Examination Guidelines: A complete evaluation includes B-mode imaging, spectral Doppler, color Doppler, and power Doppler as needed of all accessible portions of each vessel. Bilateral testing is considered an integral part of a complete examination. Limited examinations for reoccurring indications may be performed as noted.  +----------+-------------+----------+-----------+------------------+ RIGHT TCD Right VM (cm)Depth (cm)Pulsatility     Comment       +----------+-------------+----------+-----------+------------------+ MCA                                         Unable to insonate  +----------+-------------+----------+-----------+------------------+ ACA                                         Unable to insonate +----------+-------------+----------+-----------+------------------+ Term ICA                                    Unable to insonate +----------+-------------+----------+-----------+------------------+ PCA           17.00                 1.75                       +----------+-------------+----------+-----------+------------------+ Opthalmic     19.00                 1.43                       +----------+-------------+----------+-----------+------------------+ ICA siphon    22.00                 1.68                       +----------+-------------+----------+-----------+------------------+ Vertebral    -18.00                 1.61                       +----------+-------------+----------+-----------+------------------+  +----------+------------+----------+-----------+-------+ LEFT TCD  Left VM (cm)Depth (cm)PulsatilityComment +----------+------------+----------+-----------+-------+ MCA          36.00                 2.17            +----------+------------+----------+-----------+-------+ ACA          -20.00                1.61            +----------+------------+----------+-----------+-------+ Term ICA     27.00                 1.77            +----------+------------+----------+-----------+-------+  PCA          16.00                 1.83            +----------+------------+----------+-----------+-------+ Opthalmic    16.00                 1.60            +----------+------------+----------+-----------+-------+ ICA siphon   18.00                 1.51            +----------+------------+----------+-----------+-------+ Vertebral    -12.00                1.27            +----------+------------+----------+-----------+-------+ Distal ICA   18.00                                  +----------+------------+----------+-----------+-------+  +------------+-------+------------------+             VM cm/s     Comment       +------------+-------+------------------+ Prox Basilar       Unable to insonate +------------+-------+------------------+ +----------------------+-------------------+ Right Lindegaard RatioUnable to calculate +----------------------+-------------------+ +---------------------+---+ Left Lindegaard Ratio2.0 +---------------------+---+  Summary:  Absent right temporal and poor occipital window limits evaluation. Low normal mean flow velocities in majority of identified vessels. Globally elevated pulsatility indices suggest diffuse increase in intracranial pressurelikely *See table(s) above for TCD measurements and observations.  Diagnosing physician: Antony Contras MD Electronically signed by Antony Contras MD on 12/05/2019 at 12:57:21 PM.    Final    VAS [place] TRANSCRANIAL DOPPLER  Result Date: 12/02/2019  Transcranial Doppler Indications: Subarachnoid hemorrhage. History: Diabetes mellitus, hypertension, coronary artery disease. Limitations for diagnostic windows: Unable to insonate right transtemporal window. Unable to insonate left transtemporal window. Comparison Study: No prior study Performing Technologist: Sharion Dove RVS Supporting Technologist: Maudry Mayhew RDMS, RVT, RDCS  Examination Guidelines: A complete evaluation includes B-mode imaging, spectral Doppler, color Doppler, and power Doppler as needed of all accessible portions of each vessel. Bilateral testing is considered an integral part of a complete examination. Limited examinations for reoccurring indications may be performed as noted.  +----------+-------------+----------+-----------+------------------+ RIGHT TCD Right VM (cm)Depth (cm)Pulsatility     Comment       +----------+-------------+----------+-----------+------------------+ MCA                                         Unable  to insonate +----------+-------------+----------+-----------+------------------+ ACA                                         Unable to insonate +----------+-------------+----------+-----------+------------------+ Term ICA                                    Unable to insonate +----------+-------------+----------+-----------+------------------+ PCA                                         Unable to insonate +----------+-------------+----------+-----------+------------------+ Opthalmic     16.00  1.17                       +----------+-------------+----------+-----------+------------------+ ICA siphon    38.00                 1.87                       +----------+-------------+----------+-----------+------------------+ Vertebral    -15.00                 0.90                       +----------+-------------+----------+-----------+------------------+  +----------+------------+----------+-----------+------------------+ LEFT TCD  Left VM (cm)Depth (cm)Pulsatility     Comment       +----------+------------+----------+-----------+------------------+ MCA                                        Unable to insonate +----------+------------+----------+-----------+------------------+ ACA          -21.00                1.38                       +----------+------------+----------+-----------+------------------+ Term ICA                                   Unable to insonate +----------+------------+----------+-----------+------------------+ PCA                                        Unable to insonate +----------+------------+----------+-----------+------------------+ Opthalmic    17.00                 0.90                       +----------+------------+----------+-----------+------------------+ ICA siphon   21.00                 1.47                       +----------+------------+----------+-----------+------------------+ Vertebral     -21.00                1.09                       +----------+------------+----------+-----------+------------------+  +------------+-------+-------+             VM cm/sComment +------------+-------+-------+ Prox Basilar-24.00         +------------+-------+-------+ +----------------------+-------------------+ Right Lindegaard RatioUnable to calculate +----------------------+-------------------+ +---------------------+-------------------+ Left Lindegaard RatioUnable to calculate +---------------------+-------------------+  Summary:  Absent right temporal and poor left temporal and occipital window limits exam.Low mean flow velocities in majority of identified vessels of anterior and posterior circulation of unclear significance. No definite evidence of vasospasm noted. *See table(s) above for TCD measurements and observations.  Diagnosing physician: Antony Contras MD Electronically signed by Antony Contras MD on 12/02/2019 at 1:57:45 PM.    Final    VAS [place] TRANSCRANIAL DOPPLER  Result Date: 12/02/2019  Transcranial Doppler Indications: Subarachnoid hemorrhage. Limitations: Patient movement, poor patient cooperation Limitations for diagnostic windows: Unable to insonate right transtemporal window. Unable to insonate left transtemporal window. Comparison Study: 11/30/2019 Performing  Technologist: Oliver Hum RVT  Examination Guidelines: A complete evaluation includes B-mode imaging, spectral Doppler, color Doppler, and power Doppler as needed of all accessible portions of each vessel. Bilateral testing is considered an integral part of a complete examination. Limited examinations for reoccurring indications may be performed as noted.  +----------+-------------+----------+-----------+------------------+ RIGHT TCD Right VM (cm)Depth (cm)Pulsatility     Comment       +----------+-------------+----------+-----------+------------------+ MCA                                         Unable to  insonate +----------+-------------+----------+-----------+------------------+ ACA                                         Unable to insonate +----------+-------------+----------+-----------+------------------+ Term ICA                                    Unable to insonate +----------+-------------+----------+-----------+------------------+ PCA                                         Unable to insonate +----------+-------------+----------+-----------+------------------+ Opthalmic     26.00                 1.32                       +----------+-------------+----------+-----------+------------------+ ICA siphon    24.00                 1.64                       +----------+-------------+----------+-----------+------------------+ Vertebral                                   Unable to insonate +----------+-------------+----------+-----------+------------------+  +----------+------------+----------+-----------+------------------+ LEFT TCD  Left VM (cm)Depth (cm)Pulsatility     Comment       +----------+------------+----------+-----------+------------------+ MCA                                        Unable to insonate +----------+------------+----------+-----------+------------------+ ACA                                        Unable to insonate +----------+------------+----------+-----------+------------------+ Term ICA                                   Unable to insonate +----------+------------+----------+-----------+------------------+ PCA                                        Unable to insonate +----------+------------+----------+-----------+------------------+ Opthalmic    20.00                 1.34                       +----------+------------+----------+-----------+------------------+  ICA siphon   21.00                 1.45                       +----------+------------+----------+-----------+------------------+ Vertebral                                   Unable to insonate +----------+------------+----------+-----------+------------------+  +------------+-------+------------------+             VM cm/s     Comment       +------------+-------+------------------+ Prox Basilar       Unable to insonate +------------+-------+------------------+ Dist Basilar       Unable to insonate +------------+-------+------------------+ Summary:  Absent bitemporal and occiptal windows limit evaluation. Normal mean flow velocities in both carotid siphons and opthalmic arteries. *See table(s) above for TCD measurements and observations.  Diagnosing physician: Antony Contras MD Electronically signed by Antony Contras MD on 12/02/2019 at 1:53:33 PM.    Final    [place] EKG SITE RITE  Result Date: 12/03/2019 If Site Rite image not attached, placement could not be confirmed due to current cardiac rhythm.  CT ANGIO HEAD CODE STROKE  Result Date: 11/28/2019 CLINICAL DATA:  Subarachnoid hemorrhage, follow-up EXAM: CT ANGIOGRAPHY HEAD TECHNIQUE: Multidetector CT imaging of the head was performed using the standard protocol during bolus administration of intravenous contrast. Multiplanar CT image reconstructions and MIPs were obtained to evaluate the vascular anatomy. CONTRAST:  70mL OMNIPAQUE IOHEXOL 350 MG/ML SOLN COMPARISON:  None. FINDINGS: CTA HEAD Motion artifact is present. Anterior circulation: Intracranial internal carotid arteries are patent with atherosclerotic irregularity and calcified plaque. Middle and anterior cerebral arteries are patent. Posterior circulation: Intracranial vertebral, basilar, and posterior cerebral arteries are patent. There is atherosclerotic irregularity of the vertebral arteries with mixed plaque causing mild stenosis. Venous sinuses: Patent as permitted by contrast timing. Anatomic variants: None significant. Review of the MIP images confirms the above findings IMPRESSION: No aneurysm identified. Remains highly suspicious  for ruptured aneurysm and catheter angiography is recommended. Electronically Signed   By: Macy Mis M.D.   On: 11/28/2019 13:37    Microbiology No results found for this or any previous visit (from the past 240 hour(s)).  Lab Basic Metabolic Panel: No results for input(s): NA, K, CL, CO2, GLUCOSE, BUN, CREATININE, CALCIUM, MG, PHOS in the last 168 hours. Liver Function Tests: No results for input(s): AST, ALT, ALKPHOS, BILITOT, PROT, ALBUMIN in the last 168 hours. No results for input(s): LIPASE, AMYLASE in the last 168 hours. No results for input(s): AMMONIA in the last 168 hours. CBC: No results for input(s): WBC, NEUTROABS, HGB, HCT, MCV, PLT in the last 168 hours. Cardiac Enzymes: No results for input(s): CKTOTAL, CKMB, CKMBINDEX, TROPONINI in the last 168 hours. Sepsis Labs: No results for input(s): PROCALCITON, WBC, LATICACIDVEN in the last 168 hours.  Procedures/Operations     Roselie Awkward 12/27/2019, 6:22 PM

## 2020-01-28 ENCOUNTER — Encounter (HOSPITAL_COMMUNITY): Payer: Medicare Other

## 2020-04-17 ENCOUNTER — Other Ambulatory Visit: Payer: Medicare Other

## 2020-04-17 ENCOUNTER — Ambulatory Visit (HOSPITAL_COMMUNITY): Payer: Medicare Other

## 2020-04-20 ENCOUNTER — Ambulatory Visit: Payer: Medicare Other | Admitting: Internal Medicine
